# Patient Record
Sex: Female | Born: 1950 | Race: Black or African American | Hispanic: No | Smoking: Never smoker
Health system: Southern US, Community
[De-identification: ages and names within clinical notes are randomized; demographics above are authoritative.]

## PROBLEM LIST (undated history)

## (undated) DIAGNOSIS — I10 Essential (primary) hypertension: Secondary | ICD-10-CM

## (undated) DIAGNOSIS — I639 Cerebral infarction, unspecified: Secondary | ICD-10-CM

## (undated) DIAGNOSIS — E119 Type 2 diabetes mellitus without complications: Secondary | ICD-10-CM

## (undated) DIAGNOSIS — G4730 Sleep apnea, unspecified: Secondary | ICD-10-CM

## (undated) DIAGNOSIS — M1990 Unspecified osteoarthritis, unspecified site: Secondary | ICD-10-CM

## (undated) DIAGNOSIS — E785 Hyperlipidemia, unspecified: Secondary | ICD-10-CM

## (undated) DIAGNOSIS — D649 Anemia, unspecified: Secondary | ICD-10-CM

## (undated) HISTORY — DX: Type 2 diabetes mellitus without complications: E11.9

## (undated) HISTORY — PX: COLONOSCOPY: SHX174

## (undated) HISTORY — PX: MYOMECTOMY: SHX85

## (undated) HISTORY — DX: Essential (primary) hypertension: I10

## (undated) HISTORY — DX: Hyperlipidemia, unspecified: E78.5

---

## 2001-10-14 ENCOUNTER — Emergency Department (HOSPITAL_COMMUNITY): Admission: EM | Admit: 2001-10-14 | Discharge: 2001-10-14 | Payer: Self-pay | Admitting: Emergency Medicine

## 2003-03-02 ENCOUNTER — Other Ambulatory Visit: Admission: RE | Admit: 2003-03-02 | Discharge: 2003-03-02 | Payer: Self-pay | Admitting: Obstetrics and Gynecology

## 2006-06-13 ENCOUNTER — Other Ambulatory Visit: Admission: RE | Admit: 2006-06-13 | Discharge: 2006-06-13 | Payer: Self-pay | Admitting: *Deleted

## 2006-09-20 ENCOUNTER — Encounter: Admission: RE | Admit: 2006-09-20 | Discharge: 2006-09-20 | Payer: Self-pay | Admitting: *Deleted

## 2006-10-09 ENCOUNTER — Encounter: Admission: RE | Admit: 2006-10-09 | Discharge: 2006-10-09 | Payer: Self-pay | Admitting: *Deleted

## 2009-07-13 ENCOUNTER — Inpatient Hospital Stay (HOSPITAL_COMMUNITY): Admission: RE | Admit: 2009-07-13 | Discharge: 2009-07-15 | Payer: Self-pay | Admitting: Obstetrics and Gynecology

## 2009-07-13 ENCOUNTER — Encounter (HOSPITAL_COMMUNITY): Payer: Self-pay | Admitting: Obstetrics and Gynecology

## 2010-05-31 LAB — GLUCOSE, CAPILLARY
Glucose-Capillary: 134 mg/dL — ABNORMAL HIGH (ref 70–99)
Glucose-Capillary: 148 mg/dL — ABNORMAL HIGH (ref 70–99)
Glucose-Capillary: 151 mg/dL — ABNORMAL HIGH (ref 70–99)
Glucose-Capillary: 157 mg/dL — ABNORMAL HIGH (ref 70–99)
Glucose-Capillary: 269 mg/dL — ABNORMAL HIGH (ref 70–99)

## 2010-05-31 LAB — BASIC METABOLIC PANEL
CO2: 28 mEq/L (ref 19–32)
Calcium: 10.6 mg/dL — ABNORMAL HIGH (ref 8.4–10.5)
Creatinine, Ser: 0.92 mg/dL (ref 0.4–1.2)
GFR calc Af Amer: >60 mL/min (ref >60)
GFR calc non Af Amer: >60 mL/min (ref >60)

## 2010-05-31 LAB — CBC
HCT: 32.3 % — ABNORMAL LOW (ref 36.0–46.0)
MCHC: 34 g/dL (ref 30.0–36.0)
MCV: 88.3 fL (ref 78.0–100.0)
Platelets: 180 10*3/uL (ref 150–400)
RBC: 3.66 MIL/uL — ABNORMAL LOW (ref 3.87–5.11)
RBC: 4.68 MIL/uL (ref 3.87–5.11)
WBC: 5.9 10*3/uL (ref 4.0–10.5)

## 2010-06-06 LAB — CBC
RBC: 4.62 MIL/uL (ref 3.87–5.11)
WBC: 5.3 10*3/uL (ref 4.0–10.5)

## 2010-06-06 LAB — BASIC METABOLIC PANEL
CO2: 30 mEq/L (ref 19–32)
Chloride: 102 mEq/L (ref 96–112)
GFR calc Af Amer: >60 mL/min (ref >60)
Sodium: 139 mEq/L (ref 135–145)

## 2010-09-06 ENCOUNTER — Other Ambulatory Visit: Payer: Self-pay | Admitting: Family Medicine

## 2010-09-06 DIAGNOSIS — Z1231 Encounter for screening mammogram for malignant neoplasm of breast: Secondary | ICD-10-CM

## 2012-07-19 ENCOUNTER — Encounter (HOSPITAL_COMMUNITY): Payer: Self-pay

## 2012-07-19 ENCOUNTER — Emergency Department (INDEPENDENT_AMBULATORY_CARE_PROVIDER_SITE_OTHER)
Admission: EM | Admit: 2012-07-19 | Discharge: 2012-07-19 | Disposition: A | Discharge status: Home / Self Care | Payer: BC Managed Care – PPO | Source: Home / Self Care

## 2012-07-19 DIAGNOSIS — E119 Type 2 diabetes mellitus without complications: Secondary | ICD-10-CM

## 2012-07-19 DIAGNOSIS — I10 Essential (primary) hypertension: Secondary | ICD-10-CM

## 2012-07-19 MED ORDER — INSULIN GLARGINE 100 UNIT/ML ~~LOC~~ SOLN: 40.0000 [IU] | Freq: Every day | SUBCUTANEOUS | Status: DC

## 2012-07-19 MED ORDER — AMLODIPINE BESYLATE 10 MG PO TABS: 10.0000 mg | ORAL_TABLET | Freq: Every day | ORAL | Status: DC

## 2012-07-19 MED ORDER — SIMVASTATIN 40 MG PO TABS: 40.0000 mg | ORAL_TABLET | Freq: Every evening | ORAL | Status: DC

## 2012-07-19 MED ORDER — METOPROLOL-HYDROCHLOROTHIAZIDE 100-25 MG PO TABS: 1.0000 | ORAL_TABLET | Freq: Every day | ORAL | Status: DC

## 2012-07-19 MED ORDER — METFORMIN HCL 500 MG PO TABS: 500.0000 mg | ORAL_TABLET | Freq: Two times a day (BID) | ORAL | Status: DC

## 2012-07-19 MED ORDER — LISINOPRIL 20 MG PO TABS: 20.0000 mg | ORAL_TABLET | Freq: Every day | ORAL | Status: DC

## 2012-07-19 NOTE — ED Notes (Signed)
Patient here to establish care Has history of DM-HTN and cholesterol

## 2012-07-19 NOTE — ED Provider Notes (Signed)
History     CSN: [id number]  Arrival date & time 07/19/12  1736   First MD Initiated Contact with Patient 07/19/12 1755      Chief Complaint  Patient presents with  . Establish Care    (Consider location/radiation/quality/duration/timing/severity/associated sxs/prior treatment) HPI 62 year old female with past medical history of hypertension and diabetes who presented to our clinic for medication refills. Patient has no current complaints. No complaints of chest pain, shortness of breath or palpitations. No blurry vision, no lightheadedness or loss of consciousness. No abdominal pain or nausea or vomiting.  History reviewed. No pertinent past medical history.  History reviewed. No pertinent past surgical history.  Family medical history significant for HTN, HLD   History  Substance Use Topics  . Smoking status: Never Smoker   . Smokeless tobacco: Not on file  . Alcohol Use: No    OB History   Grav Para Term Preterm Abortions TAB SAB Ect Mult Living                  Review of Systems  Constitutional: Negative for fever, chills, diaphoresis, activity change, appetite change and fatigue.  HENT: Negative for ear pain, nosebleeds, congestion, facial swelling, rhinorrhea, neck pain, neck stiffness and ear discharge.   Eyes: Negative for pain, discharge, redness, itching and visual disturbance.  Respiratory: Negative for cough, choking, chest tightness, shortness of breath, wheezing and stridor.   Cardiovascular: Negative for chest pain, palpitations and leg swelling.  Gastrointestinal: Negative for abdominal distention.  Genitourinary: Negative for dysuria, urgency, frequency, hematuria, flank pain, decreased urine volume, difficulty urinating and dyspareunia.  Musculoskeletal: Negative for back pain, joint swelling, arthralgias and gait problem.  Neurological: Negative for dizziness, tremors, seizures, syncope, facial asymmetry, speech difficulty, weakness, light-headedness,  numbness and headaches.  Hematological: Negative for adenopathy. Does not bruise/bleed easily.  Psychiatric/Behavioral: Negative for hallucinations, behavioral problems, confusion, dysphoric mood, decreased concentration and agitation.    Allergies  Review of patient's allergies indicates no known allergies.  Home Medications   Current Outpatient Rx  Name  Route  Sig  Dispense  Refill  . amLODipine (NORVASC) 10 MG tablet   Oral   Take 1 tablet (10 mg total) by mouth daily.   30 tablet   3   . insulin glargine (LANTUS) 100 UNIT/ML injection   Subcutaneous   Inject 0.4 mLs (40 Units total) into the skin at bedtime.   10 mL   3   . lisinopril (PRINIVIL,ZESTRIL) 20 MG tablet   Oral   Take 1 tablet (20 mg total) by mouth daily.   30 tablet   3   . metFORMIN (GLUCOPHAGE) 500 MG tablet   Oral   Take 1 tablet (500 mg total) by mouth 2 (two) times daily with a meal.   60 tablet   3   . metoprolol-hydrochlorothiazide (LOPRESSOR HCT) 100-25 MG per tablet   Oral   Take 1 tablet by mouth daily.   30 tablet   3   . simvastatin (ZOCOR) 40 MG tablet   Oral   Take 1 tablet (40 mg total) by mouth every evening.   30 tablet   3     BP 173/91  Pulse 93  Temp(Src) 97.8 F (36.6 C) (Oral)  Resp 16  SpO2 95%  Physical Exam  Constitutional: Appears well-developed and well-nourished. No distress.  HENT: Normocephalic. External right and left ear normal. Oropharynx is clear and moist.  Eyes: Conjunctivae and EOM are normal. PERRLA, no scleral icterus.  Neck: Normal ROM. Neck supple. No JVD. No tracheal deviation. No thyromegaly.  CVS: RRR, S1/S2 +, no murmurs, no gallops, no carotid bruit.  Pulmonary: Effort and breath sounds normal, no stridor, rhonchi, wheezes, rales.  Abdominal: Soft. BS +,  no distension, tenderness, rebound or guarding.  Musculoskeletal: Normal range of motion. No edema and no tenderness.  Lymphadenopathy: No lymphadenopathy noted, cervical,  inguinal. Neuro: Alert. Normal reflexes, muscle tone coordination. No cranial nerve deficit. Skin: Skin is warm and dry. No rash noted. Not diaphoretic. No erythema. No pallor.  Psychiatric: Normal mood and affect. Behavior, judgment, thought content normal.    ED Course  Procedures (including critical care time)  Labs Reviewed - No data to display No results found.   1. Hypertension  - We have discussed target BP range - I have advised pt to check BP regularly and to call us back if the numbers are higher than 140/90 - discussed the importance of compliance with medical therapy and diet  - Blood pressure is slightly higher on this visit 173/91. Patient instructed to follow up with [place] and be compliant with BP medications. She reported she ran out of meds for which reason her BP may be elevated.   2. Diabetes  - We will check A1c on the next visit. Continue taking insulin and metformin       MDM  HTN Diabetes        Alison Murray, MD 07/19/12 1801

## 2013-04-16 ENCOUNTER — Ambulatory Visit: Payer: BC Managed Care – PPO | Admitting: Internal Medicine

## 2013-10-08 ENCOUNTER — Other Ambulatory Visit: Payer: Self-pay | Admitting: Family

## 2013-10-08 DIAGNOSIS — R29810 Facial weakness: Secondary | ICD-10-CM

## 2013-10-08 DIAGNOSIS — I10 Essential (primary) hypertension: Secondary | ICD-10-CM

## 2013-10-08 DIAGNOSIS — G459 Transient cerebral ischemic attack, unspecified: Secondary | ICD-10-CM

## 2013-10-15 ENCOUNTER — Inpatient Hospital Stay: Admission: RE | Admit: 2013-10-15 | Payer: BC Managed Care – PPO | Source: Ambulatory Visit

## 2013-10-15 ENCOUNTER — Ambulatory Visit
Admission: RE | Admit: 2013-10-15 | Discharge: 2013-10-15 | Disposition: A | Discharge status: Home / Self Care | Payer: BC Managed Care – PPO | Source: Ambulatory Visit | Attending: Family | Admitting: Family

## 2013-10-15 DIAGNOSIS — G459 Transient cerebral ischemic attack, unspecified: Secondary | ICD-10-CM

## 2013-10-15 DIAGNOSIS — I10 Essential (primary) hypertension: Secondary | ICD-10-CM

## 2013-10-15 DIAGNOSIS — R29810 Facial weakness: Secondary | ICD-10-CM

## 2013-10-17 ENCOUNTER — Other Ambulatory Visit: Payer: BC Managed Care – PPO

## 2013-10-20 ENCOUNTER — Ambulatory Visit
Admission: RE | Admit: 2013-10-20 | Discharge: 2013-10-20 | Disposition: A | Discharge status: Home / Self Care | Payer: BC Managed Care – PPO | Source: Ambulatory Visit | Attending: Family | Admitting: Family

## 2013-10-20 DIAGNOSIS — R29810 Facial weakness: Secondary | ICD-10-CM

## 2013-10-20 DIAGNOSIS — I10 Essential (primary) hypertension: Secondary | ICD-10-CM

## 2013-10-20 DIAGNOSIS — G459 Transient cerebral ischemic attack, unspecified: Secondary | ICD-10-CM

## 2013-10-20 MED ORDER — IOHEXOL 350 MG/ML SOLN
75.0000 mL | Freq: Once | INTRAVENOUS | Status: AC | PRN
  Administered 2013-10-20: 75 mL via INTRAVENOUS

## 2013-11-26 ENCOUNTER — Encounter: Payer: BC Managed Care – PPO | Admitting: Vascular Surgery

## 2013-12-04 ENCOUNTER — Encounter: Payer: Self-pay | Admitting: Vascular Surgery

## 2013-12-05 ENCOUNTER — Encounter: Payer: BC Managed Care – PPO | Admitting: Vascular Surgery

## 2013-12-16 ENCOUNTER — Encounter: Payer: Self-pay | Admitting: Vascular Surgery

## 2013-12-17 ENCOUNTER — Ambulatory Visit (INDEPENDENT_AMBULATORY_CARE_PROVIDER_SITE_OTHER): Payer: BC Managed Care – PPO | Admitting: Vascular Surgery

## 2013-12-17 ENCOUNTER — Encounter: Payer: Self-pay | Admitting: Vascular Surgery

## 2013-12-17 VITALS — BP 133/77 | HR 84 | Resp 18 | Ht 61.0 in | Wt 172.0 lb

## 2013-12-17 DIAGNOSIS — I739 Peripheral vascular disease, unspecified: Principal | ICD-10-CM

## 2013-12-17 DIAGNOSIS — I779 Disorder of arteries and arterioles, unspecified: Secondary | ICD-10-CM | POA: Insufficient documentation

## 2013-12-17 NOTE — Assessment & Plan Note (Signed)
This patient had a brief episode of speech disturbance several months ago. She's had no further symptoms. Her carotid duplex scan shows only mild disease bilaterally. She was not on aspirin at the time and I've asked her to begin taking aspirin. She is on a statin. I have recommended a follow carotid duplex scan in 6 months and I'll see her back at that time. She knows to call sooner she has any new neurologic symptoms. At this point, given the duplex findings which show only mild disease, I would not recommend any further workup or intervention.

## 2013-12-17 NOTE — Progress Notes (Signed)
Patient ID: Donna ChampionGarnette S Quinones, female   DOB: Aug 07, 1950, 63 y.o.   MRN: [id number]  Reason for Consult: New carotid evaluation   Referred by Truman HaywardStarkes, Takia S, FNP  Subjective:     HPI:  Donna [name] is a 63 y.o. female who tells me that several months ago she had an episode where she had some difficulty speaking. She claims that she was simply tired however there was some concern that she had a TIA. This prompted a workup including a carotid duplex scan and CT angiogram. She was found to have moderate carotid disease and sent for carotid evaluation. She cannot remember exactly when these symptoms occurred, but states that she has had no further symptoms. She is currently on a statin but does not take aspirin.  She is right-handed. She denies any previous history of stroke, TIAs, expressive or receptive aphasia, or amaurosis fugax.  I have reviewed her records from the referring physician's office and she does have a history of poorly controlled blood pressure. She had some issues with noncompliance in the past.  Past Medical History  Diagnosis Date  . Hypertension   . Diabetes mellitus without complication   . Hyperlipidemia    Family History  Problem Relation Age of Onset  . Diabetes Father   . Diabetes Sister    Past Surgical History  Procedure Laterality Date  . Myomectomy     Short Social History:  History  Substance Use Topics  . Smoking status: Never Smoker   . Smokeless tobacco: Never Used  . Alcohol Use: No   No Known Allergies  Current Outpatient Prescriptions  Medication Sig Dispense Refill  . insulin glargine (LANTUS) 100 UNIT/ML injection Inject 0.4 mLs (40 Units total) into the skin at bedtime.  10 mL  3  . metFORMIN (GLUCOPHAGE) 500 MG tablet Take 1 tablet (500 mg total) by mouth 2 (two) times daily with a meal.  60 tablet  3  . olmesartan-hydrochlorothiazide (BENICAR HCT) 40-25 MG per tablet Take 1 tablet by mouth daily.      . simvastatin (ZOCOR) 40 MG  tablet Take 1 tablet (40 mg total) by mouth every evening.  30 tablet  3  . amLODipine (NORVASC) 10 MG tablet Take 1 tablet (10 mg total) by mouth daily.  30 tablet  3  . lisinopril (PRINIVIL,ZESTRIL) 20 MG tablet Take 1 tablet (20 mg total) by mouth daily.  30 tablet  3  . metoprolol-hydrochlorothiazide (LOPRESSOR HCT) 100-25 MG per tablet Take 1 tablet by mouth daily.  30 tablet  3   No current facility-administered medications for this visit.   Review of Systems  Constitutional: Negative for chills and fever.  Eyes: Negative for loss of vision.  Respiratory: Negative for cough and wheezing.  Cardiovascular: Negative for chest pain, chest tightness, claudication, dyspnea with exertion, orthopnea and palpitations.  GI: Negative for blood in stool and vomiting.  GU: Negative for dysuria and hematuria.  Musculoskeletal: Negative for leg pain, joint pain and myalgias.  Skin: Negative for rash and wound.  Neurological: Negative for dizziness and speech difficulty.  Hematologic: Negative for bruises/bleeds easily. Psychiatric: Negative for depressed mood.       Objective:  Objective  Filed Vitals:   12/17/13 1533 12/17/13 1535  BP: 154/79 133/77  Pulse: 89 84  Resp: 18   Height: 5\' 1"  (1.549 m)   Weight: 172 lb (78.019 kg)    Body mass index is 32.52 kg/(m^2).  Physical Exam  Constitutional: She is  oriented to person, place, and time. She appears well-developed and well-nourished.  HENT:  Head: Normocephalic and atraumatic.  Neck: Neck supple. No JVD present. No thyromegaly present.  Cardiovascular: Normal rate, regular rhythm and normal heart sounds.  Exam reveals no friction rub.   No murmur heard. Pulses:      Radial pulses are 2+ on the right side, and 2+ on the left side.       Femoral pulses are 2+ on the right side, and 2+ on the left side.      Popliteal pulses are 2+ on the right side, and 2+ on the left side.       Dorsalis pedis pulses are 2+ on the right side, and  2+ on the left side.  I do not detect carotid bruits.  Pulmonary/Chest: Breath sounds normal. She has no wheezes. She has no rales.  Abdominal: Soft. Bowel sounds are normal. There is no tenderness.  I do not palpate an aneurysm.  Musculoskeletal: Normal range of motion. She exhibits no edema.  Lymphadenopathy:    She has no cervical adenopathy.  Neurological: She is alert and oriented to person, place, and time. She has normal strength. No sensory deficit.  Skin: No lesion and no rash noted.  Psychiatric: She has a normal mood and affect.   Data: CAROTID DUPLEX: I reviewed her carotid duplex scan which was performed on 10/15/2013. This showed evidence of a less than 50% right carotid stenosis with a 50-69% left carotid stenosis. Peak systolic velocity on the left was 197 cm/s with an end-diastolic velocity of 33 cm/s. This would suggest that the stenosis was in the lower and of the 50-69% range.  Both vertebral arteries were patent with normally directed flow.  CT ANGIOGRAM: This showed no acute infarct or hemorrhage. There was some atherosclerotic plaque in the cavernous carotid arteries bilaterally without significant stenosis.     Assessment/Plan:    Carotid artery disease This patient had a brief episode of speech disturbance several months ago. She's had no further symptoms. Her carotid duplex scan shows only mild disease bilaterally. She was not on aspirin at the time and I've asked her to begin taking aspirin. She is on a statin. I have recommended a follow carotid duplex scan in 6 months and I'll see her back at that time. She knows to call sooner she has any new neurologic symptoms. At this point, given the duplex findings which show only mild disease, I would not recommend any further workup or intervention.   Chuck Hint MD [facility]

## 2013-12-18 NOTE — Addendum Note (Signed)
Addended by: Adria DillELDRIDGE-LEWIS, Sheray Grist L on: 12/18/2013 10:45 AM   Modules accepted: Orders

## 2014-04-10 LAB — HM COLONOSCOPY

## 2014-06-17 ENCOUNTER — Other Ambulatory Visit (HOSPITAL_COMMUNITY): Payer: BC Managed Care – PPO

## 2014-06-17 ENCOUNTER — Ambulatory Visit: Payer: BC Managed Care – PPO | Admitting: Vascular Surgery

## 2014-06-24 ENCOUNTER — Other Ambulatory Visit: Payer: Self-pay | Admitting: Vascular Surgery

## 2014-06-24 ENCOUNTER — Ambulatory Visit (HOSPITAL_COMMUNITY)
Admission: RE | Admit: 2014-06-24 | Discharge: 2014-06-24 | Disposition: A | Discharge status: Home / Self Care | Payer: BLUE CROSS/BLUE SHIELD | Source: Ambulatory Visit | Attending: Vascular Surgery | Admitting: Vascular Surgery

## 2014-06-24 DIAGNOSIS — I739 Peripheral vascular disease, unspecified: Principal | ICD-10-CM

## 2014-06-24 DIAGNOSIS — I779 Disorder of arteries and arterioles, unspecified: Secondary | ICD-10-CM

## 2014-06-30 ENCOUNTER — Encounter: Payer: Self-pay | Admitting: Vascular Surgery

## 2014-07-01 ENCOUNTER — Ambulatory Visit: Payer: Self-pay | Admitting: Vascular Surgery

## 2014-07-01 ENCOUNTER — Ambulatory Visit (INDEPENDENT_AMBULATORY_CARE_PROVIDER_SITE_OTHER): Payer: BLUE CROSS/BLUE SHIELD | Admitting: Vascular Surgery

## 2014-07-01 ENCOUNTER — Encounter: Payer: Self-pay | Admitting: Vascular Surgery

## 2014-07-01 VITALS — BP 211/95 | HR 94 | Ht 61.0 in | Wt 174.9 lb

## 2014-07-01 DIAGNOSIS — I6523 Occlusion and stenosis of bilateral carotid arteries: Secondary | ICD-10-CM
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
## 2014-07-07 NOTE — Addendum Note (Signed)
Addended by: Sharee PimpleMCCHESNEY, MARILYN K on: 07/07/2014 02:48 PM   Modules accepted: Orders

## 2015-06-30 ENCOUNTER — Encounter: Payer: Self-pay | Admitting: Family

## 2015-07-07 ENCOUNTER — Encounter: Payer: Self-pay | Admitting: Family

## 2015-07-07 ENCOUNTER — Ambulatory Visit (INDEPENDENT_AMBULATORY_CARE_PROVIDER_SITE_OTHER): Payer: BLUE CROSS/BLUE SHIELD | Admitting: Family

## 2015-07-07 ENCOUNTER — Ambulatory Visit (HOSPITAL_COMMUNITY)
Admission: RE | Admit: 2015-07-07 | Discharge: 2015-07-07 | Disposition: A | Discharge status: Home / Self Care | Payer: BLUE CROSS/BLUE SHIELD | Source: Ambulatory Visit | Attending: Family | Admitting: Family

## 2015-07-07 VITALS — BP 175/80 | Ht 61.0 in | Wt 176.9 lb

## 2015-07-07 DIAGNOSIS — I10 Essential (primary) hypertension: Secondary | ICD-10-CM

## 2015-07-07 DIAGNOSIS — E119 Type 2 diabetes mellitus without complications: Secondary | ICD-10-CM | POA: Diagnosis not present

## 2015-07-07 DIAGNOSIS — I6523 Occlusion and stenosis of bilateral carotid arteries: Secondary | ICD-10-CM

## 2015-07-07 DIAGNOSIS — E785 Hyperlipidemia, unspecified: Secondary | ICD-10-CM | POA: Diagnosis not present
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
## 2015-07-07 NOTE — Patient Instructions (Signed)
Stroke Prevention Some medical conditions and behaviors are associated with an increased chance of having a stroke. You may prevent a stroke by making healthy choices and managing medical conditions. HOW CAN I REDUCE MY RISK OF HAVING A STROKE?   Stay physically active. Get at least 30 minutes of activity on most or all days.  Do not smoke. It may also be helpful to avoid exposure to secondhand smoke.  Limit alcohol use. Moderate alcohol use is considered to be:  No more than 2 drinks per day for men.  No more than 1 drink per day for nonpregnant women.  Eat healthy foods. This involves:  Eating 5 or more servings of fruits and vegetables a day.  Making dietary changes that address high blood pressure (hypertension), high cholesterol, diabetes, or obesity.  Manage your cholesterol levels.  Making food choices that are high in fiber and low in saturated fat, trans fat, and cholesterol may control cholesterol levels.  Take any prescribed medicines to control cholesterol as directed by your health care provider.  Manage your diabetes.  Controlling your carbohydrate and sugar intake is recommended to manage diabetes.  Take any prescribed medicines to control diabetes as directed by your health care provider.  Control your hypertension.  Making food choices that are low in salt (sodium), saturated fat, trans fat, and cholesterol is recommended to manage hypertension.  Ask your health care provider if you need treatment to lower your blood pressure. Take any prescribed medicines to control hypertension as directed by your health care provider.  If you are 18-39 years of age, have your blood pressure checked every 3-5 years. If you are 40 years of age or older, have your blood pressure checked every year.  Maintain a healthy weight.  Reducing calorie intake and making food choices that are low in sodium, saturated fat, trans fat, and cholesterol are recommended to manage  weight.  Stop drug abuse.  Avoid taking birth control pills.  Talk to your health care provider about the risks of taking birth control pills if you are over 35 years old, smoke, get migraines, or have ever had a blood clot.  Get evaluated for sleep disorders (sleep apnea).  Talk to your health care provider about getting a sleep evaluation if you snore a lot or have excessive sleepiness.  Take medicines only as directed by your health care provider.  For some people, aspirin or blood thinners (anticoagulants) are helpful in reducing the risk of forming abnormal blood clots that can lead to stroke. If you have the irregular heart rhythm of atrial fibrillation, you should be on a blood thinner unless there is a good reason you cannot take them.  Understand all your medicine instructions.  Make sure that other conditions (such as anemia or atherosclerosis) are addressed. SEEK IMMEDIATE MEDICAL CARE IF:   You have sudden weakness or numbness of the face, arm, or leg, especially on one side of the body.  Your face or eyelid droops to one side.  You have sudden confusion.  You have trouble speaking (aphasia) or understanding.  You have sudden trouble seeing in one or both eyes.  You have sudden trouble walking.  You have dizziness.  You have a loss of balance or coordination.  You have a sudden, severe headache with no known cause.  You have new chest pain or an irregular heartbeat. Any of these symptoms may represent a serious problem that is an emergency. Do not wait to see if the symptoms will   go away. Get medical help at once. Call your local emergency services (911 in [place] Do not drive yourself to the hospital.   This information is not intended to replace advice given to you by your health care provider. Make sure you discuss any questions you have with your health care provider.   Document Released: 04/06/2004 Document Revised: 03/20/2014 Document Reviewed:  08/30/2012 Elsevier Interactive Patient Education 2016 Elsevier Inc.  

## 2015-08-10 NOTE — Addendum Note (Signed)
Addended by: Sharee PimpleMCCHESNEY, MARILYN K on: 08/10/2015 04:55 PM   Modules accepted: Orders

## 2016-01-19 ENCOUNTER — Ambulatory Visit: Payer: BLUE CROSS/BLUE SHIELD | Admitting: Podiatry

## 2016-02-09 ENCOUNTER — Ambulatory Visit (INDEPENDENT_AMBULATORY_CARE_PROVIDER_SITE_OTHER): Payer: BLUE CROSS/BLUE SHIELD | Admitting: Podiatry

## 2016-02-09 ENCOUNTER — Encounter: Payer: Self-pay | Admitting: Podiatry

## 2016-02-09 VITALS — BP 186/90 | HR 91 | Resp 18

## 2016-02-09 DIAGNOSIS — E119 Type 2 diabetes mellitus without complications: Secondary | ICD-10-CM | POA: Diagnosis not present

## 2016-02-09 NOTE — Progress Notes (Signed)
   Subjective:    Patient ID: Donna [name], female    DOB: 05-26-1950, 65 y.o.   MRN: [id number]  HPI    This diabetic patient presents today requesting a general diabetic foot examination. She estimates that she's been a diabetic for 10-14 years. She denies any foot pain, however, describes some swelling in the p.m. in her toes reducing in the a.m. She denies any history of foot ulceration, claudication or amputation She denies smoking. She does not recall any previous podiatric examination Patient is a Pharmacologistretail worker [employer]  Review of Systems  All other systems reviewed and are negative.      Objective:   Physical Exam  Orientated 3  Vascular: No peripheral edema bilaterally No calf pain bilaterally DP and PT pulses 2/4 bilaterally Capillary reflex immediate bilaterally  Neurological: Sensation to 10 g monofilament wire intact 5/5 bilaterally Vibratory sensation reactive bilaterally Ankle reflex equal and reactive bilaterally  Dermatological: No open skin lesions bilaterally Minimal keratoses lateral fifth toes bilaterally Manual motor testing dorsi flexion, plantar flexion, inversion, eversion 5/5 bilaterally There is no restriction range of motion or crepitus in the ankle, subtalar, midtarsal, MPJ bilaterally      Assessment & Plan:   Assessment: Type II diabetic with satisfactory neurovascular status No diabetic foot complications  Plan: I reviewed the results of the exam with patient today in detail. I informed her that her diabetic foot exam was satisfactory for her neurovascular status. I discussed general foot care with patient. Also, recommended that when she has pain cares not to place feet and whirlpool or manipulate the cuticles. Suggested slight promising of the minimal corns on the fifth toes.  Return yearly or as needed

## 2016-02-09 NOTE — Patient Instructions (Signed)

## 2016-07-05 DIAGNOSIS — I10 Essential (primary) hypertension: Secondary | ICD-10-CM | POA: Diagnosis not present

## 2016-07-05 DIAGNOSIS — E7800 Pure hypercholesterolemia, unspecified: Secondary | ICD-10-CM | POA: Diagnosis not present

## 2016-07-05 DIAGNOSIS — M79673 Pain in unspecified foot: Secondary | ICD-10-CM | POA: Diagnosis not present

## 2016-07-05 DIAGNOSIS — E119 Type 2 diabetes mellitus without complications: Secondary | ICD-10-CM | POA: Diagnosis not present

## 2016-12-20 DIAGNOSIS — E2839 Other primary ovarian failure: Secondary | ICD-10-CM | POA: Diagnosis not present

## 2016-12-20 DIAGNOSIS — I10 Essential (primary) hypertension: Secondary | ICD-10-CM | POA: Diagnosis not present

## 2016-12-20 DIAGNOSIS — E119 Type 2 diabetes mellitus without complications: Secondary | ICD-10-CM | POA: Diagnosis not present

## 2016-12-20 DIAGNOSIS — Z0000 Encounter for general adult medical examination without abnormal findings: Secondary | ICD-10-CM | POA: Diagnosis not present

## 2016-12-20 DIAGNOSIS — Z1231 Encounter for screening mammogram for malignant neoplasm of breast: Secondary | ICD-10-CM | POA: Diagnosis not present

## 2016-12-20 DIAGNOSIS — Z79899 Other long term (current) drug therapy: Secondary | ICD-10-CM | POA: Diagnosis not present

## 2016-12-29 ENCOUNTER — Other Ambulatory Visit: Payer: Self-pay | Admitting: Internal Medicine

## 2016-12-29 DIAGNOSIS — E2839 Other primary ovarian failure: Secondary | ICD-10-CM

## 2017-02-06 ENCOUNTER — Encounter (INDEPENDENT_AMBULATORY_CARE_PROVIDER_SITE_OTHER): Payer: BLUE CROSS/BLUE SHIELD | Admitting: Podiatry

## 2017-02-06 NOTE — Progress Notes (Signed)
This encounter was created in error - please disregard.

## 2017-07-11 ENCOUNTER — Encounter (HOSPITAL_COMMUNITY): Payer: BLUE CROSS/BLUE SHIELD

## 2017-07-11 ENCOUNTER — Ambulatory Visit: Payer: BLUE CROSS/BLUE SHIELD | Admitting: Family

## 2017-07-12 ENCOUNTER — Encounter: Payer: Self-pay | Admitting: Family

## 2017-07-12 ENCOUNTER — Ambulatory Visit (HOSPITAL_COMMUNITY)
Admission: RE | Admit: 2017-07-12 | Discharge: 2017-07-12 | Disposition: A | Discharge status: Home / Self Care | Payer: Medicare Other | Source: Ambulatory Visit | Attending: Family | Admitting: Family

## 2017-07-12 ENCOUNTER — Ambulatory Visit (INDEPENDENT_AMBULATORY_CARE_PROVIDER_SITE_OTHER): Payer: Medicare Other | Admitting: Family

## 2017-07-12 VITALS — BP 232/100 | HR 89 | Temp 97.2°F | Resp 18 | Ht 61.0 in | Wt 167.0 lb

## 2017-07-12 DIAGNOSIS — I6523 Occlusion and stenosis of bilateral carotid arteries: Secondary | ICD-10-CM | POA: Insufficient documentation

## 2017-07-12 DIAGNOSIS — I10 Essential (primary) hypertension: Secondary | ICD-10-CM

## 2017-07-12 NOTE — Patient Instructions (Signed)
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
## 2017-07-13 DIAGNOSIS — E119 Type 2 diabetes mellitus without complications: Secondary | ICD-10-CM | POA: Diagnosis not present

## 2017-07-13 DIAGNOSIS — E7800 Pure hypercholesterolemia, unspecified: Secondary | ICD-10-CM | POA: Diagnosis not present

## 2017-07-13 DIAGNOSIS — I10 Essential (primary) hypertension: Secondary | ICD-10-CM | POA: Diagnosis not present

## 2017-07-13 DIAGNOSIS — R946 Abnormal results of thyroid function studies: Secondary | ICD-10-CM | POA: Diagnosis not present

## 2017-08-03 DIAGNOSIS — I10 Essential (primary) hypertension: Secondary | ICD-10-CM | POA: Diagnosis not present

## 2017-08-03 DIAGNOSIS — E119 Type 2 diabetes mellitus without complications: Secondary | ICD-10-CM | POA: Diagnosis not present

## 2017-10-12 LAB — HEPATIC FUNCTION PANEL
ALK PHOS: 85 (ref 25–125)
ALT: 22
AST: 18

## 2017-10-12 LAB — LIPID PANEL
Cholesterol: 237 — AB (ref 0–200)
HDL: 71 — AB (ref 35–70)
LDL CALC: 2
LDL/HDL RATIO: 1.9
Triglycerides: 167 — AB (ref 40–160)

## 2017-10-12 LAB — BASIC METABOLIC PANEL
BUN: 18 (ref 4–21)
CREATININE: 1
GLUCOSE: 138
Potassium: 4 (ref 3.4–5.3)
Sodium: 141 (ref 137–147)

## 2017-10-12 LAB — HEMOGLOBIN A1C: Hemoglobin A1C: 9.7

## 2017-11-30 DIAGNOSIS — N6314 Unspecified lump in the right breast, lower inner quadrant: Secondary | ICD-10-CM

## 2017-11-30 DIAGNOSIS — Z1231 Encounter for screening mammogram for malignant neoplasm of breast: Secondary | ICD-10-CM

## 2017-11-30 DIAGNOSIS — I10 Essential (primary) hypertension: Secondary | ICD-10-CM

## 2017-12-04 ENCOUNTER — Other Ambulatory Visit: Payer: Self-pay | Admitting: Nurse Practitioner

## 2017-12-04 DIAGNOSIS — N63 Unspecified lump in unspecified breast: Secondary | ICD-10-CM

## 2017-12-09 ENCOUNTER — Encounter: Payer: Self-pay | Admitting: Nurse Practitioner

## 2017-12-09 DIAGNOSIS — I10 Essential (primary) hypertension: Secondary | ICD-10-CM

## 2017-12-09 DIAGNOSIS — E119 Type 2 diabetes mellitus without complications: Secondary | ICD-10-CM | POA: Insufficient documentation

## 2017-12-10 ENCOUNTER — Ambulatory Visit: Payer: Medicare Other

## 2017-12-10 ENCOUNTER — Ambulatory Visit
Admission: RE | Admit: 2017-12-10 | Discharge: 2017-12-10 | Disposition: A | Discharge status: Home / Self Care | Payer: PRIVATE HEALTH INSURANCE | Source: Ambulatory Visit | Attending: Nurse Practitioner | Admitting: Nurse Practitioner

## 2017-12-10 DIAGNOSIS — N63 Unspecified lump in unspecified breast: Secondary | ICD-10-CM

## 2018-01-02 ENCOUNTER — Ambulatory Visit (INDEPENDENT_AMBULATORY_CARE_PROVIDER_SITE_OTHER): Payer: Medicare Other | Admitting: Nurse Practitioner

## 2018-01-02 ENCOUNTER — Ambulatory Visit (INDEPENDENT_AMBULATORY_CARE_PROVIDER_SITE_OTHER): Payer: Medicare Other

## 2018-01-02 VITALS — BP 158/90 | HR 61 | Temp 98.5°F | Ht 60.0 in | Wt 170.0 lb

## 2018-01-02 DIAGNOSIS — E2839 Other primary ovarian failure: Secondary | ICD-10-CM

## 2018-01-02 DIAGNOSIS — E785 Hyperlipidemia, unspecified: Secondary | ICD-10-CM

## 2018-01-02 DIAGNOSIS — Z794 Long term (current) use of insulin: Secondary | ICD-10-CM

## 2018-01-02 DIAGNOSIS — E119 Type 2 diabetes mellitus without complications: Secondary | ICD-10-CM

## 2018-01-02 DIAGNOSIS — I10 Essential (primary) hypertension: Secondary | ICD-10-CM | POA: Diagnosis not present

## 2018-01-02 DIAGNOSIS — Z23 Encounter for immunization: Secondary | ICD-10-CM

## 2018-01-02 DIAGNOSIS — Z1159 Encounter for screening for other viral diseases: Secondary | ICD-10-CM

## 2018-01-02 DIAGNOSIS — Z1211 Encounter for screening for malignant neoplasm of colon: Secondary | ICD-10-CM

## 2018-01-02 DIAGNOSIS — Z0000 Encounter for general adult medical examination without abnormal findings: Secondary | ICD-10-CM

## 2018-01-02 LAB — POCT URINALYSIS DIPSTICK
BILIRUBIN UA: NEGATIVE
Glucose, UA: POSITIVE — AB
Ketones, UA: NEGATIVE
LEUKOCYTES UA: NEGATIVE
Nitrite, UA: NEGATIVE
Protein, UA: POSITIVE — AB
Spec Grav, UA: 1.01 (ref 1.010–1.025)
Urobilinogen, UA: 0.2 E.U./dL
pH, UA: 6 (ref 5.0–8.0)

## 2018-01-02 LAB — POCT UA - MICROALBUMIN
CREATININE, POC: 100 mg/dL
MICROALBUMIN (UR) POC: 150 mg/L

## 2018-01-02 NOTE — Progress Notes (Signed)
Subjective:   Donna [name] is a 67 y.o. female who presents for Medicare Annual (Subsequent) preventive examination.  Review of Systems:  n/a Cardiac Risk Factors include: sedentary lifestyle;diabetes mellitus;dyslipidemia;advanced age (>23men, >54 women)     Objective:     Vitals: BP (!) 158/90 (BP Location: Left Arm)   Pulse 61   Temp 98.5 F (36.9 C)   Ht 5' (1.524 m)   Wt 170 lb (77.1 kg)   SpO2 94%   BMI 33.20 kg/m   Body mass index is 33.2 kg/m.  Advanced Directives 01/02/2018 07/07/2015 12/17/2013  Does Patient Have a Medical Advance Directive? No No No  Would patient like information on creating a medical advance directive? No - Patient declined No - patient declined information Yes English as a second language teacher given    Tobacco Social History   Tobacco Use  Smoking Status Never Smoker  Smokeless Tobacco Never Used     Counseling given: Not Answered   Clinical Intake:  Pre-visit preparation completed: Yes  Pain : No/denies pain Pain Score: 0-No pain     Nutritional Status: BMI > 30  Obese Nutritional Risks: None Diabetes: Yes CBG done?: No Did pt. bring in CBG monitor from home?: No  How often do you need to have someone help you when you read instructions, pamphlets, or other written materials from your doctor or pharmacy?: 1 - Never What is the last grade level you completed in school?: 12th grade  Interpreter Needed?: No  Information entered by :: NAllen LPN  Past Medical History:  Diagnosis Date  . Diabetes mellitus without complication (HCC)   . Hyperlipidemia   . Hypertension    Past Surgical History:  Procedure Laterality Date  . MYOMECTOMY     Family History  Problem Relation Age of Onset  . Diabetes Father   . Diabetes Sister   . Breast cancer Neg Hx    Social History   Socioeconomic History  . Marital status: Legally Separated    Spouse name: Not on file  . Number of children: Not on file  . Years of education: Not on  file  . Highest education level: Not on file  Occupational History  . Not on file  Social Needs  . Financial resource strain: Not hard at all  . Food insecurity:    Worry: Never true    Inability: Never true  . Transportation needs:    Medical: No    Non-medical: No  Tobacco Use  . Smoking status: Never Smoker  . Smokeless tobacco: Never Used  Substance and Sexual Activity  . Alcohol use: No  . Drug use: No  . Sexual activity: Not Currently  Lifestyle  . Physical activity:    Days per week: 0 days    Minutes per session: 0 min  . Stress: Not at all  Relationships  . Social connections:    Talks on phone: Not on file    Gets together: Not on file    Attends religious service: Not on file    Active member of club or organization: Not on file    Attends meetings of clubs or organizations: Not on file    Relationship status: Not on file  Other Topics Concern  . Not on file  Social History Narrative  . Not on file    Outpatient Encounter Medications as of 01/02/2018  Medication Sig  . amLODipine (NORVASC) 5 MG tablet Take 5 mg by mouth daily.  Marland Kitchen aspirin 325 MG tablet  Take 325 mg by mouth daily.  . XULTOPHY 100-3.6 UNIT-MG/ML SOPN   . atorvastatin (LIPITOR) 10 MG tablet Take 10 mg by mouth daily at 6 PM.   . insulin glargine (LANTUS) 100 UNIT/ML injection Inject 0.4 mLs (40 Units total) into the skin at bedtime. (Patient not taking: Reported on 01/02/2018)  . Insulin Glargine-Lixisenatide (SOLIQUA) 100-33 UNT-MCG/ML SOPN Inject into the skin.  Marland Kitchen olmesartan-hydrochlorothiazide (BENICAR HCT) 40-25 MG per tablet Take 1 tablet by mouth daily.  . simvastatin (ZOCOR) 40 MG tablet Take 1 tablet (40 mg total) by mouth every evening.  Lilian Kapur 625 MG tablet    No facility-administered encounter medications on file as of 01/02/2018.     Activities of Daily Living In your present state of health, do you have any difficulty performing the following activities: 01/02/2018    Hearing? N  Vision? N  Difficulty concentrating or making decisions? N  Walking or climbing stairs? N  Dressing or bathing? N  Doing errands, shopping? N  Preparing Food and eating ? N  Using the Toilet? N  In the past six months, have you accidently leaked urine? N  Do you have problems with loss of bowel control? N  Managing your Medications? N  Managing your Finances? N  Housekeeping or managing your Housekeeping? N  Some recent data might be hidden    Patient Care Team: Dorothyann Peng, MD as PCP - General (Internal Medicine) Sallye Lat, MD as Consulting Physician (Ophthalmology)    Assessment:   This is a routine wellness examination for Donna [name].  Exercise Activities and Dietary recommendations Current Exercise Habits: The patient does not participate in regular exercise at present, Exercise limited by: None identified  Goals    . Exercise 150 min/wk Moderate Activity (pt-stated)       Fall Risk Fall Risk  01/02/2018 07/12/2017  Falls in the past year? No No   Is the patient's home free of loose throw rugs in walkways, pet beds, electrical cords, etc?   yes      Grab bars in the bathroom? no      Handrails on the stairs?   yes      Adequate lighting?   yes  Timed Get Up and Go performed: n/a  Depression Screen PHQ 2/9 Scores 01/02/2018 07/12/2017  PHQ - 2 Score 0 0     Cognitive Function     6CIT Screen 01/02/2018  What Year? 0 points  What month? 0 points  What time? 0 points  Count back from 20 0 points  Months in reverse 0 points  Repeat phrase 0 points  Total Score 0    There is no immunization history for the selected administration types on file for this patient.  Qualifies for Shingles Vaccine?yes  Screening Tests Health Maintenance  Topic Date Due  . Hepatitis C Screening  06/28/1950  . FOOT EXAM  06/23/1961  . OPHTHALMOLOGY EXAM  06/23/1961  . TETANUS/TDAP  06/24/1970  . COLONOSCOPY  06/23/2001  . DEXA SCAN  06/23/2016  .  PNA vac Low Risk Adult (1 of 2 - PCV13) 06/23/2016  . INFLUENZA VACCINE  01/03/2019 (Originally 10/11/2017)  . HEMOGLOBIN A1C  04/14/2018  . MAMMOGRAM  12/11/2019    Cancer Screenings: Lung: Low Dose CT Chest recommended if Age 25-80 years, 30 pack-year currently smoking OR have quit w/in 15years. Patient does not qualify. Breast:  Up to date on Mammogram? Yes   Up to date of Bone Density/Dexa? No Colorectal: states she had  Additional Screenings: : Hepatitis C Screening: due     Plan:    Patient states she would like to start exercising regularly. States she sees Dr. Dione Booze for eyes and may follow up with him for a diabetic eye exam. BMD is pending. Reports she has had colonoscopy in the past couple of years.  I have personally reviewed and noted the following in the patient's chart:   . Medical and social history . Use of alcohol, tobacco or illicit drugs  . Current medications and supplements . Functional ability and status . Nutritional status . Physical activity . Advanced directives . List of other physicians . Hospitalizations, surgeries, and ER visits in previous 12 months . Vitals . Screenings to include cognitive, depression, and falls . Referrals and appointments  In addition, I have reviewed and discussed with patient certain preventive protocols, quality metrics, and best practice recommendations. A written personalized care plan for preventive services as well as general preventive health recommendations were provided to patient.     Barb Merino, LPN  16/12/9602

## 2018-01-02 NOTE — Progress Notes (Addendum)
Subjective:     Patient ID: Donna [name] , adult    DOB: 1950-06-19 , 67 y.o.   MRN: [id number]   Diabetes  She presents for her follow-up diabetic visit. She has type 2 diabetes mellitus. Pertinent negatives for diabetes include no fatigue. Risk factors for coronary artery disease include obesity and sedentary lifestyle. She is compliant with treatment most of the time. She has not had a previous visit with a dietitian. She rarely participates in exercise. (She has not been checking her blood sugar due to "the meter is messed up") An ACE inhibitor/angiotensin II receptor blocker is being taken. She does not see a podiatrist.Eye exam is not current.   The patient states she uses post menopausal status for birth control. Last LMP was No LMP recorded. Patient is postmenopausal.. Negative for Dysmenorrhea and Negative for Menorrhagia. Negative for: breast discharge, breast lump(s), breast pain and breast self exam. Associated symptoms include abnormal vaginal bleeding. Pertinent negatives include abnormal bleeding (hematology), anxiety, decreased libido, depression, difficulty falling sleep, dyspareunia, history of infertility, nocturia, sexual dysfunction, sleep disturbances, urinary incontinence, urinary urgency, vaginal discharge and vaginal itching. Diet regular.The patient states her exercise level is    . The patient's tobacco use is:  Social History   Tobacco Use  Smoking Status Never Smoker  Smokeless Tobacco Never Used  . She has been exposed to passive smoke. The patient's alcohol use is:  Social History   Substance and Sexual Activity  Alcohol Use No  . Additional information: Last pap at least 4 years ago  Past Medical History:  Diagnosis Date  . Diabetes mellitus without complication (HCC)   . Hyperlipidemia   . Hypertension       Current Outpatient Medications:  .  amLODipine (NORVASC) 5 MG tablet, Take 5 mg by mouth daily., Disp: , Rfl:  .  aspirin 325 MG tablet, Take  325 mg by mouth daily., Disp: , Rfl:  .  atorvastatin (LIPITOR) 10 MG tablet, Take 10 mg by mouth daily at 6 PM. , Disp: , Rfl:  .  insulin glargine (LANTUS) 100 UNIT/ML injection, Inject 0.4 mLs (40 Units total) into the skin at bedtime. (Patient not taking: Reported on 01/02/2018), Disp: 10 mL, Rfl: 3 .  Insulin Glargine-Lixisenatide (SOLIQUA) 100-33 UNT-MCG/ML SOPN, Inject into the skin., Disp: , Rfl:  .  olmesartan-hydrochlorothiazide (BENICAR HCT) 40-25 MG per tablet, Take 1 tablet by mouth daily., Disp: , Rfl:  .  simvastatin (ZOCOR) 40 MG tablet, Take 1 tablet (40 mg total) by mouth every evening., Disp: 30 tablet, Rfl: 3 .  WELCHOL 625 MG tablet, , Disp: , Rfl:  .  XULTOPHY 100-3.6 UNIT-MG/ML SOPN, , Disp: , Rfl:    No Known Allergies   Review of Systems  Constitutional: Negative.  Negative for fatigue.  Eyes: Negative.   Respiratory: Negative.   Cardiovascular: Negative.   Gastrointestinal: Negative.   Endocrine: Negative.   Genitourinary: Negative.   Musculoskeletal: Negative.   Skin: Negative.   Allergic/Immunologic: Negative.   Neurological: Negative.   Hematological: Negative.   Psychiatric/Behavioral: Negative.      Today's Vitals   01/02/18 1454  BP: (!) 158/90  Pulse: 61  Temp: 98.5 F (36.9 C)  TempSrc: Oral  SpO2: 94%  Weight: 170 lb (77.1 kg)  Height: 5' (1.524 m)   Body mass index is 33.2 kg/m.   Objective:  Physical Exam  Constitutional: She is oriented to person, place, and time. She appears well-developed and well-nourished.  HENT:  Head: Normocephalic and atraumatic.  Right Ear: External ear normal.  Left Ear: External ear normal.  Nose: Nose normal.  Mouth/Throat: Oropharynx is clear and moist.  Eyes: Pupils are equal, round, and reactive to light. Conjunctivae and EOM are normal.  Neck: Normal range of motion. Neck supple.  Cardiovascular: Normal rate, regular rhythm, normal heart sounds and intact distal pulses.  Pulmonary/Chest: Effort  normal and breath sounds normal.  Abdominal: Soft. Bowel sounds are normal.  Musculoskeletal: Normal range of motion.  Neurological: She is alert and oriented to person, place, and time.  Skin: Skin is warm and dry.  Psychiatric: She has a normal mood and affect.        Assessment And Plan:     1. Type 2 diabetes mellitus without complication, with long-term current use of insulin (HCC)  Chronic, controlled  Continue with current medications - Ambulatory referral to Ophthalmology - Hemoglobin A1c  2. Essential hypertension  Chronic, poorly controlled this visit  Encouraged to make sure to avoid high salt foods.  Continue with current medications 3. Hyperlipidemia, unspecified hyperlipidemia type  Chronic, fair control  Continue with current medications - Lipid Profile  4. Encounter for hepatitis C screening test for low risk patient  Will check for for hepatitis c due to age range - Hepatitis C antibody  5. Decreased estrogen level  Check bone density    6. Encounter for screening colonoscopy  According to USPTF Colorectal cancer Screening guidelines. Colonoscopy is recommended every 10 years, starting at age 84years.  Will refer to GI for colon cancer screening. - Ambulatory referral to Gastroenterology   Arnette Felts, FNP

## 2018-01-02 NOTE — Patient Instructions (Signed)
Hepatitis C Test Hepatitis C is a liver infection caused by the hepatitis C virus (HCV). Hepatitis C is usually diagnosed with two blood tests. One test checks for antibodies to the virus in your blood. Antibodies are proteins that your body makes to fight infections. If you have antibodies to HCV, it means you have been infected. It does not mean you are still infected. An HCV infection may not cause any symptoms, and you may be able to get rid of the virus without treatment. If you have antibodies to HCV, you will need to have another test to find out if you are still infected. This test is called the HCV RNA qualitative test. It looks for genetic material from HCV in your blood. If you are diagnosed with an active HCV infection, you may also have an HCV RNA quantitative test to measure the amount of virus in your blood (viral load). Your health care provider may repeat this test in order to monitor you during treatment. You may also have an HCV genotype test to identify the kind (genotype) of virus you have. This helps your health care provider determine the best treatment for you. You may be tested if you show symptoms of HCV infection. It is important to be tested because hepatitis C can lead to serious liver damage if not treated. All HCV tests require a blood sample taken from a vein in your hand or arm. What do the results mean? It is your responsibility to obtain your test results. Ask the lab or department performing the test when and how you will get your results. Talk to your health care provider if you have any questions about your test results. Results of both the HCV antibody test and the HCV RNA test will be either positive or negative. Meaning of Negative Test Results  If your HCV antibody test is negative, it may mean that you have not been infected with HCV. However, it can take a few months for the antibodies to build up in your blood. If it is possible you may have been infected  recently, you may need to repeat the test.  If your HCV RNA qualitative test is negative, this means it is unlikely you have an active HCV infection. Meaning of Positive Test Results  If your HCV antibody test is positive, it is likely that you are infected or have been infected with HCV.  If your HCV RNA qualitative test is also positive, it confirms you have an active HCV infection. Talk with your health care provider to discuss your results, treatment options, and if necessary, the need for more tests. Talk with your health care provider if you have any questions about your results. This information is not intended to replace advice given to you by your health care provider. Make sure you discuss any questions you have with your health care provider. Document Released: 04/01/2004 Document Revised: 11/03/2015 Document Reviewed: 06/02/2013 Elsevier Interactive Patient Education  2018 [employer]. DTaP Vaccine (Diphtheria, Tetanus, and Pertussis): What You Need to Know 1. Why get vaccinated? Diphtheria, tetanus, and pertussis are serious diseases caused by bacteria. Diphtheria and pertussis are spread from person to person. Tetanus enters the body through cuts or wounds. DIPHTHERIA causes a thick covering in the back of the throat.  It can lead to breathing problems, paralysis, heart failure, and even death.  TETANUS (Lockjaw) causes painful tightening of the muscles, usually all over the body.  It can lead to "locking" of the jaw so  the victim cannot open his mouth or swallow. Tetanus leads to death in up to 2 out of 10 cases.  PERTUSSIS (Whooping Cough) causes coughing spells so bad that it is hard for infants to eat, drink, or breathe. These spells can last for weeks.  It can lead to pneumonia, seizures (jerking and staring spells), brain damage, and death.  Diphtheria, tetanus, and pertussis vaccine (DTaP) can help prevent these diseases. Most children who are vaccinated with DTaP  will be protected throughout childhood. Many more children would get these diseases if we stopped vaccinating. DTaP is a safer version of an older vaccine called DTP. DTP is no longer used in the [place]. 2. Who should get DTaP vaccine and when? Children should get 5 doses of DTaP vaccine, one dose at each of the following ages:  2 months  4 months  6 months  15-18 months  4-6 years  DTaP may be given at the same time as other vaccines. 3. Some children should not get DTaP vaccine or should wait  Children with minor illnesses, such as a cold, may be vaccinated. But children who are moderately or severely ill should usually wait until they recover before getting DTaP vaccine.  Any child who had a life-threatening allergic reaction after a dose of DTaP should not get another dose.  Any child who suffered a brain or nervous system disease within 7 days after a dose of DTaP should not get another dose.  Talk with your doctor if your child: ? had a seizure or collapsed after a dose of DTaP, ? cried non-stop for 3 hours or more after a dose of DTaP, ? had a fever over 105F after a dose of DTaP. Ask your doctor for more information. Some of these children should not get another dose of pertussis vaccine, but may get a vaccine without pertussis, called DT. 4. Older children and adults DTaP is not licensed for adolescents, adults, or children 38 years of age and older. But older people still need protection. A vaccine called Tdap is similar to DTaP. A single dose of Tdap is recommended for people 11 through 66 years of age. Another vaccine, called Td, protects against tetanus and diphtheria, but not pertussis. It is recommended every 10 years. There are separate Vaccine Information Statements for these vaccines. 5. What are the risks from DTaP vaccine? Getting diphtheria, tetanus, or pertussis disease is much riskier than getting DTaP vaccine. However, a vaccine, like any medicine, is  capable of causing serious problems, such as severe allergic reactions. The risk of DTaP vaccine causing serious harm, or death, is extremely small. Mild problems (common)  Fever (up to about 1 child in 4)  Redness or swelling where the shot was given (up to about 1 child in 4)  Soreness or tenderness where the shot was given (up to about 1 child in 4) These problems occur more often after the 4th and 5th doses of the DTaP series than after earlier doses. Sometimes the 4th or 5th dose of DTaP vaccine is followed by swelling of the entire arm or leg in which the shot was given, lasting 1-7 days (up to about 1 child in 4). Other mild problems include:  Fussiness (up to about 1 child in 3)  Tiredness or poor appetite (up to about 1 child in 10)  Vomiting (up to about 1 child in 29) These problems generally occur 1-3 days after the shot. Moderate problems (uncommon)  Seizure (jerking or staring) (about  1 child out of 14,000)  Non-stop crying, for 3 hours or more (up to about 1 child out of 1,000)  High fever, over 105F (about 1 child out of 16,000) Severe problems (very rare)  Serious allergic reaction (less than 1 out of a million doses)  Several other severe problems have been reported after DTaP vaccine. These include: ? Long-term seizures, coma, or lowered consciousness ? Permanent brain damage. These are so rare it is hard to tell if they are caused by the vaccine. Controlling fever is especially important for children who have had seizures, for any reason. It is also important if another family member has had seizures. You can reduce fever and pain by giving your child an aspirin-free pain reliever when the shot is given, and for the next 24 hours, following the package instructions. 6. What if there is a serious reaction? What should I look for? Look for anything that concerns you, such as signs of a severe allergic reaction, very high fever, or behavior changes. Signs of a  severe allergic reaction can include hives, swelling of the face and throat, difficulty breathing, a fast heartbeat, dizziness, and weakness. These would start a few minutes to a few hours after the vaccination. What should I do?  If you think it is a severe allergic reaction or other emergency that can't wait, call 9-1-1 or get the person to the nearest hospital. Otherwise, call your doctor.  Afterward, the reaction should be reported to the Vaccine Adverse Event Reporting System (VAERS). Your doctor might file this report, or you can do it yourself through the VAERS web site at www.vaers.SamedayNews.es, or by calling 845-518-2775. ? VAERS is only for reporting reactions. They do not give medical advice. 7. The National Vaccine Injury Compensation Program The [employer] Vaccine Injury Compensation Program (VICP) is a federal program that was created to compensate people who may have been injured by certain vaccines. Persons who believe they may have been injured by a vaccine can learn about the program and about filing a claim by calling 913-569-5575 or visiting the [employer] website at GoldCloset.com.ee. 8. How can I learn more?  Ask your doctor.  Call your local or state health department.  Contact the Centers for Disease Control and Prevention (CDC): ? Call 508 503 1631 (1-800-CDC-INFO) or ? Visit CDC's website at http://hunter.com/ CDC DTaP Vaccine (Diphtheria, Tetanus, and Pertussis) VIS (07/27/05) This information is not intended to replace advice given to you by your health care provider. Make sure you discuss any questions you have with your health care provider. Document Released: 12/25/2005 Document Revised: 11/18/2015 Document Reviewed: 11/18/2015 Elsevier Interactive Patient Education  2017 [employer]. Bone Densitometry Bone densitometry is an imaging test that uses a special X-ray to measure the amount of calcium and other minerals in your bones (bone density). This  test is also known as a bone mineral density test or dual-energy X-ray absorptiometry (DXA). The test can measure bone density at your hip and your spine. It is similar to having a regular X-ray. You may have this test to:  Diagnose a condition that causes weak or thin bones (osteoporosis).  Predict your risk of a broken bone (fracture).  Determine how well osteoporosis treatment is working.  Tell a health care provider about:  Any allergies you have.  All medicines you are taking, including vitamins, herbs, eye drops, creams, and over-the-counter medicines.  Any problems you or family members have had with anesthetic medicines.  Any blood disorders you have.  Any surgeries  you have had.  Any medical conditions you have.  Possibility of pregnancy.  Any other medical test you had within the previous 14 days that used contrast material. What are the risks? Generally, this is a safe procedure. However, problems can occur and may include the following:  This test exposes you to a very small amount of radiation.  The risks of radiation exposure may be greater to unborn children.  What happens before the procedure?  Do not take any calcium supplements for 24 hours before having the test. You can otherwise eat and drink what you usually do.  Take off all metal jewelry, eyeglasses, dental appliances, and any other metal objects. What happens during the procedure?  You may lie on an exam table. There will be an X-ray generator below you and an imaging device above you.  Other devices, such as boxes or braces, may be used to position your body properly for the scan.  You will need to lie still while the machine slowly scans your body.  The images will show up on a computer monitor. What happens after the procedure? You may need more testing at a later time. This information is not intended to replace advice given to you by your health care provider. Make sure you discuss any  questions you have with your health care provider. Document Released: 03/21/2004 Document Revised: 08/05/2015 Document Reviewed: 08/07/2013 Elsevier Interactive Patient Education  2018 [place] Maintenance, Female Adopting a healthy lifestyle and getting preventive care can go a long way to promote health and wellness. Talk with your health care provider about what schedule of regular examinations is right for you. This is a good chance for you to check in with your provider about disease prevention and staying healthy. In between checkups, there are plenty of things you can do on your own. Experts have done a lot of research about which lifestyle changes and preventive measures are most likely to keep you healthy. Ask your health care provider for more information. Weight and diet Eat a healthy diet  Be sure to include plenty of vegetables, fruits, low-fat dairy products, and lean protein.  Do not eat a lot of foods high in solid fats, added sugars, or salt.  Get regular exercise. This is one of the most important things you can do for your health. ? Most adults should exercise for at least 150 minutes each week. The exercise should increase your heart rate and make you sweat (moderate-intensity exercise). ? Most adults should also do strengthening exercises at least twice a week. This is in addition to the moderate-intensity exercise.  Maintain a healthy weight  Body mass index (BMI) is a measurement that can be used to identify possible weight problems. It estimates body fat based on height and weight. Your health care provider can help determine your BMI and help you achieve or maintain a healthy weight.  For females 66 years of age and older: ? A BMI below 18.5 is considered underweight. ? A BMI of 18.5 to 24.9 is normal. ? A BMI of 25 to 29.9 is considered overweight. ? A BMI of 30 and above is considered obese.  Watch levels of cholesterol and blood lipids  You should  start having your blood tested for lipids and cholesterol at 66 years of age, then have this test every 5 years.  You may need to have your cholesterol levels checked more often if: ? Your lipid or cholesterol levels are high. ? You are  older than 66 years of age. ? You are at high risk for heart disease.  Cancer screening Lung Cancer  Lung cancer screening is recommended for adults 79-38 years old who are at high risk for lung cancer because of a history of smoking.  A yearly low-dose CT scan of the lungs is recommended for people who: ? Currently smoke. ? Have quit within the past 15 years. ? Have at least a 30-pack-year history of smoking. A pack year is smoking an average of one pack of cigarettes a day for 1 year.  Yearly screening should continue until it has been 15 years since you quit.  Yearly screening should stop if you develop a health problem that would prevent you from having lung cancer treatment.  Breast Cancer  Practice breast self-awareness. This means understanding how your breasts normally appear and feel.  It also means doing regular breast self-exams. Let your health care provider know about any changes, no matter how small.  If you are in your 20s or 30s, you should have a clinical breast exam (CBE) by a health care provider every 1-3 years as part of a regular health exam.  If you are 97 or older, have a CBE every year. Also consider having a breast X-ray (mammogram) every year.  If you have a family history of breast cancer, talk to your health care provider about genetic screening.  If you are at high risk for breast cancer, talk to your health care provider about having an MRI and a mammogram every year.  Breast cancer gene (BRCA) assessment is recommended for women who have family members with BRCA-related cancers. BRCA-related cancers include: ? Breast. ? Ovarian. ? Tubal. ? Peritoneal cancers.  Results of the assessment will determine the need  for genetic counseling and BRCA1 and BRCA2 testing.  Cervical Cancer Your health care provider may recommend that you be screened regularly for cancer of the pelvic organs (ovaries, uterus, and vagina). This screening involves a pelvic examination, including checking for microscopic changes to the surface of your cervix (Pap test). You may be encouraged to have this screening done every 3 years, beginning at age 69.  For women ages 84-65, health care providers may recommend pelvic exams and Pap testing every 3 years, or they may recommend the Pap and pelvic exam, combined with testing for human papilloma virus (HPV), every 5 years. Some types of HPV increase your risk of cervical cancer. Testing for HPV may also be done on women of any age with unclear Pap test results.  Other health care providers may not recommend any screening for nonpregnant women who are considered low risk for pelvic cancer and who do not have symptoms. Ask your health care provider if a screening pelvic exam is right for you.  If you have had past treatment for cervical cancer or a condition that could lead to cancer, you need Pap tests and screening for cancer for at least 20 years after your treatment. If Pap tests have been discontinued, your risk factors (such as having a new sexual partner) need to be reassessed to determine if screening should resume. Some women have medical problems that increase the chance of getting cervical cancer. In these cases, your health care provider may recommend more frequent screening and Pap tests.  Colorectal Cancer  This type of cancer can be detected and often prevented.  Routine colorectal cancer screening usually begins at 66 years of age and continues through 66 years of age.  Your  health care provider may recommend screening at an earlier age if you have risk factors for colon cancer.  Your health care provider may also recommend using home test kits to check for hidden blood in  the stool.  A small camera at the end of a tube can be used to examine your colon directly (sigmoidoscopy or colonoscopy). This is done to check for the earliest forms of colorectal cancer.  Routine screening usually begins at age 70.  Direct examination of the colon should be repeated every 5-10 years through 66 years of age. However, you may need to be screened more often if early forms of precancerous polyps or small growths are found.  Skin Cancer  Check your skin from head to toe regularly.  Tell your health care provider about any new moles or changes in moles, especially if there is a change in a mole's shape or color.  Also tell your health care provider if you have a mole that is larger than the size of a pencil eraser.  Always use sunscreen. Apply sunscreen liberally and repeatedly throughout the day.  Protect yourself by wearing long sleeves, pants, a wide-brimmed hat, and sunglasses whenever you are outside.  Heart disease, diabetes, and high blood pressure  High blood pressure causes heart disease and increases the risk of stroke. High blood pressure is more likely to develop in: ? People who have blood pressure in the high end of the normal range (130-139/85-89 mm Hg). ? People who are overweight or obese. ? People who are African American.  If you are 105-5 years of age, have your blood pressure checked every 3-5 years. If you are 30 years of age or older, have your blood pressure checked every year. You should have your blood pressure measured twice-once when you are at a hospital or clinic, and once when you are not at a hospital or clinic. Record the average of the two measurements. To check your blood pressure when you are not at a hospital or clinic, you can use: ? An automated blood pressure machine at a pharmacy. ? A home blood pressure monitor.  If you are between 69 years and 22 years old, ask your health care provider if you should take aspirin to prevent  strokes.  Have regular diabetes screenings. This involves taking a blood sample to check your fasting blood sugar level. ? If you are at a normal weight and have a low risk for diabetes, have this test once every three years after 66 years of age. ? If you are overweight and have a high risk for diabetes, consider being tested at a younger age or more often. Preventing infection Hepatitis B  If you have a higher risk for hepatitis B, you should be screened for this virus. You are considered at high risk for hepatitis B if: ? You were born in a country where hepatitis B is common. Ask your health care provider which countries are considered high risk. ? Your parents were born in a high-risk country, and you have not been immunized against hepatitis B (hepatitis B vaccine). ? You have HIV or AIDS. ? You use needles to inject street drugs. ? You live with someone who has hepatitis B. ? You have had sex with someone who has hepatitis B. ? You get hemodialysis treatment. ? You take certain medicines for conditions, including cancer, organ transplantation, and autoimmune conditions.  Hepatitis C  Blood testing is recommended for: ? Everyone born from [birth year] through 1964. ?  Anyone with known risk factors for hepatitis C.  Sexually transmitted infections (STIs)  You should be screened for sexually transmitted infections (STIs) including gonorrhea and chlamydia if: ? You are sexually active and are younger than 66 years of age. ? You are older than 66 years of age and your health care provider tells you that you are at risk for this type of infection. ? Your sexual activity has changed since you were last screened and you are at an increased risk for chlamydia or gonorrhea. Ask your health care provider if you are at risk.  If you do not have HIV, but are at risk, it may be recommended that you take a prescription medicine daily to prevent HIV infection. This is called pre-exposure prophylaxis  (PrEP). You are considered at risk if: ? You are sexually active and do not regularly use condoms or know the HIV status of your partner(s). ? You take drugs by injection. ? You are sexually active with a partner who has HIV.  Talk with your health care provider about whether you are at high risk of being infected with HIV. If you choose to begin PrEP, you should first be tested for HIV. You should then be tested every 3 months for as long as you are taking PrEP. Pregnancy  If you are premenopausal and you may become pregnant, ask your health care provider about preconception counseling.  If you may become pregnant, take 400 to 800 micrograms (mcg) of folic acid every day.  If you want to prevent pregnancy, talk to your health care provider about birth control (contraception). Osteoporosis and menopause  Osteoporosis is a disease in which the bones lose minerals and strength with aging. This can result in serious bone fractures. Your risk for osteoporosis can be identified using a bone density scan.  If you are 53 years of age or older, or if you are at risk for osteoporosis and fractures, ask your health care provider if you should be screened.  Ask your health care provider whether you should take a calcium or vitamin D supplement to lower your risk for osteoporosis.  Menopause may have certain physical symptoms and risks.  Hormone replacement therapy may reduce some of these symptoms and risks. Talk to your health care provider about whether hormone replacement therapy is right for you. Follow these instructions at home:  Schedule regular health, dental, and eye exams.  Stay current with your immunizations.  Do not use any tobacco products including cigarettes, chewing tobacco, or electronic cigarettes.  If you are pregnant, do not drink alcohol.  If you are breastfeeding, limit how much and how often you drink alcohol.  Limit alcohol intake to no more than 1 drink per day for  nonpregnant women. One drink equals 12 ounces of beer, 5 ounces of wine, or 1 ounces of hard liquor.  Do not use street drugs.  Do not share needles.  Ask your health care provider for help if you need support or information about quitting drugs.  Tell your health care provider if you often feel depressed.  Tell your health care provider if you have ever been abused or do not feel safe at home. This information is not intended to replace advice given to you by your health care provider. Make sure you discuss any questions you have with your health care provider. Document Released: 09/12/2010 Document Revised: 08/05/2015 Document Reviewed: 12/01/2014 Elsevier Interactive Patient Education  [employer].

## 2018-01-02 NOTE — Addendum Note (Signed)
Addended by: Barb Merino on: 01/02/2018 04:38 PM   Modules accepted: Orders

## 2018-01-02 NOTE — Patient Instructions (Addendum)
[name] , Thank you for taking time to come for your Medicare Wellness Visit. I appreciate your ongoing commitment to your health goals. Please review the following plan we discussed and let me know if I can assist you in the future.   Screening recommendations/referrals: Colonoscopy: states she had Mammogram: up to date Bone Density: ordered Recommended yearly ophthalmology/optometry visit for glaucoma screening and checkup Recommended yearly dental visit for hygiene and checkup  Vaccinations: Influenza vaccine: declines Pneumococcal vaccine: 2010 Tdap vaccine: order sent to pharmacy Shingles vaccine: decline    Advanced directives: Advance directive discussed with you today. Even though you declined this today please call our office should you change your mind and we can give you the proper paperwork for you to fill out.   Conditions/risks identified: Obesity: States wants to start exercising regularly.   Next appointment: 01/02/2018 at 2:30p   Preventive Care 65 Years and Older, Female Preventive care refers to lifestyle choices and visits with your health care provider that can promote health and wellness. What does preventive care include?  A yearly physical exam. This is also called an annual well check.  Dental exams once or twice a year.  Routine eye exams. Ask your health care provider how often you should have your eyes checked.  Personal lifestyle choices, including:  Daily care of your teeth and gums.  Regular physical activity.  Eating a healthy diet.  Avoiding tobacco and drug use.  Limiting alcohol use.  Practicing safe sex.  Taking low-dose aspirin every day.  Taking vitamin and mineral supplements as recommended by your health care provider. What happens during an annual well check? The services and screenings done by your health care provider during your annual well check will depend on your age, overall health, lifestyle risk factors, and family  history of disease. Counseling  Your health care provider may ask you questions about your:  Alcohol use.  Tobacco use.  Drug use.  Emotional well-being.  Home and relationship well-being.  Sexual activity.  Eating habits.  History of falls.  Memory and ability to understand (cognition).  Work and work Astronomer.  Reproductive health. Screening  You may have the following tests or measurements:  Height, weight, and BMI.  Blood pressure.  Lipid and cholesterol levels. These may be checked every 5 years, or more frequently if you are over 69 years old.  Skin check.  Lung cancer screening. You may have this screening every year starting at age 20 if you have a 30-pack-year history of smoking and currently smoke or have quit within the past 15 years.  Fecal occult blood test (FOBT) of the stool. You may have this test every year starting at age 66.  Flexible sigmoidoscopy or colonoscopy. You may have a sigmoidoscopy every 5 years or a colonoscopy every 10 years starting at age 69.  Hepatitis C blood test.  Hepatitis B blood test.  Sexually transmitted disease (STD) testing.  Diabetes screening. This is done by checking your blood sugar (glucose) after you have not eaten for a while (fasting). You may have this done every 1-3 years.  Bone density scan. This is done to screen for osteoporosis. You may have this done starting at age 73.  Mammogram. This may be done every 1-2 years. Talk to your health care provider about how often you should have regular mammograms. Talk with your health care provider about your test results, treatment options, and if necessary, the need for more tests. Vaccines  Your health care  provider may recommend certain vaccines, such as:  Influenza vaccine. This is recommended every year.  Tetanus, diphtheria, and acellular pertussis (Tdap, Td) vaccine. You may need a Td booster every 10 years.  Zoster vaccine. You may need this after  age 26.  Pneumococcal 13-valent conjugate (PCV13) vaccine. One dose is recommended after age 54.  Pneumococcal polysaccharide (PPSV23) vaccine. One dose is recommended after age 72. Talk to your health care provider about which screenings and vaccines you need and how often you need them. This information is not intended to replace advice given to you by your health care provider. Make sure you discuss any questions you have with your health care provider. Document Released: 03/26/2015 Document Revised: 11/17/2015 Document Reviewed: 12/29/2014 Elsevier Interactive Patient Education  2017 [employer] Prevention in the Home Falls can cause injuries. They can happen to people of all ages. There are many things you can do to make your home safe and to help prevent falls. What can I do on the outside of my home?  Regularly fix the edges of walkways and driveways and fix any cracks.  Remove anything that might make you trip as you walk through a door, such as a raised step or threshold.  Trim any bushes or trees on the path to your home.  Use bright outdoor lighting.  Clear any walking paths of anything that might make someone trip, such as rocks or tools.  Regularly check to see if handrails are loose or broken. Make sure that both sides of any steps have handrails.  Any raised decks and porches should have guardrails on the edges.  Have any leaves, snow, or ice cleared regularly.  Use sand or salt on walking paths during winter.  Clean up any spills in your garage right away. This includes oil or grease spills. What can I do in the bathroom?  Use night lights.  Install grab bars by the toilet and in the tub and shower. Do not use towel bars as grab bars.  Use non-skid mats or decals in the tub or shower.  If you need to sit down in the shower, use a plastic, non-slip stool.  Keep the floor dry. Clean up any water that spills on the floor as soon as it  happens.  Remove soap buildup in the tub or shower regularly.  Attach bath mats securely with double-sided non-slip rug tape.  Do not have throw rugs and other things on the floor that can make you trip. What can I do in the bedroom?  Use night lights.  Make sure that you have a light by your bed that is easy to reach.  Do not use any sheets or blankets that are too big for your bed. They should not hang down onto the floor.  Have a firm chair that has side arms. You can use this for support while you get dressed.  Do not have throw rugs and other things on the floor that can make you trip. What can I do in the kitchen?  Clean up any spills right away.  Avoid walking on wet floors.  Keep items that you use a lot in easy-to-reach places.  If you need to reach something above you, use a strong step stool that has a grab bar.  Keep electrical cords out of the way.  Do not use floor polish or wax that makes floors slippery. If you must use wax, use non-skid floor wax.  Do not have throw  rugs and other things on the floor that can make you trip. What can I do with my stairs?  Do not leave any items on the stairs.  Make sure that there are handrails on both sides of the stairs and use them. Fix handrails that are broken or loose. Make sure that handrails are as long as the stairways.  Check any carpeting to make sure that it is firmly attached to the stairs. Fix any carpet that is loose or worn.  Avoid having throw rugs at the top or bottom of the stairs. If you do have throw rugs, attach them to the floor with carpet tape.  Make sure that you have a light switch at the top of the stairs and the bottom of the stairs. If you do not have them, ask someone to add them for you. What else can I do to help prevent falls?  Wear shoes that:  Do not have high heels.  Have rubber bottoms.  Are comfortable and fit you well.  Are closed at the toe. Do not wear sandals.  If you  use a stepladder:  Make sure that it is fully opened. Do not climb a closed stepladder.  Make sure that both sides of the stepladder are locked into place.  Ask someone to hold it for you, if possible.  Clearly mark and make sure that you can see:  Any grab bars or handrails.  First and last steps.  Where the edge of each step is.  Use tools that help you move around (mobility aids) if they are needed. These include:  Canes.  Walkers.  Scooters.  Crutches.  Turn on the lights when you go into a dark area. Replace any light bulbs as soon as they burn out.  Set up your furniture so you have a clear path. Avoid moving your furniture around.  If any of your floors are uneven, fix them.  If there are any pets around you, be aware of where they are.  Review your medicines with your doctor. Some medicines can make you feel dizzy. This can increase your chance of falling. Ask your doctor what other things that you can do to help prevent falls. This information is not intended to replace advice given to you by your health care provider. Make sure you discuss any questions you have with your health care provider. Document Released: 12/24/2008 Document Revised: 08/05/2015 Document Reviewed: 04/03/2014 Elsevier Interactive Patient Education  2017 [employer].

## 2018-01-03 LAB — HEPATITIS C ANTIBODY: Hep C Virus Ab: <0.1 s/co ratio (ref 0.0–0.9)

## 2018-01-03 LAB — LIPID PANEL
CHOLESTEROL TOTAL: 262 mg/dL — AB (ref 100–199)
Chol/HDL Ratio: 4 ratio (ref 0.0–4.4)
HDL: 66 mg/dL (ref >39)
LDL CALC: 143 mg/dL — AB (ref 0–99)
Triglycerides: 265 mg/dL — ABNORMAL HIGH (ref 0–149)
VLDL Cholesterol Cal: 53 mg/dL — ABNORMAL HIGH (ref 5–40)

## 2018-01-03 LAB — HEMOGLOBIN A1C
ESTIMATED AVERAGE GLUCOSE: 272 mg/dL
Hgb A1c MFr Bld: 11.1 % — ABNORMAL HIGH (ref 4.8–5.6)

## 2018-01-07 ENCOUNTER — Ambulatory Visit: Payer: Medicare Other | Admitting: Nurse Practitioner

## 2018-01-16 ENCOUNTER — Encounter: Payer: Self-pay | Admitting: Nurse Practitioner

## 2018-02-05 ENCOUNTER — Other Ambulatory Visit: Payer: Self-pay | Admitting: Nurse Practitioner

## 2018-03-05 ENCOUNTER — Encounter: Payer: Self-pay | Admitting: Nurse Practitioner

## 2018-03-12 LAB — HM DIABETES EYE EXAM

## 2018-03-21 ENCOUNTER — Other Ambulatory Visit: Payer: Self-pay | Admitting: Nurse Practitioner

## 2018-03-21 DIAGNOSIS — Z794 Long term (current) use of insulin: Principal | ICD-10-CM

## 2018-03-21 DIAGNOSIS — E119 Type 2 diabetes mellitus without complications: Secondary | ICD-10-CM

## 2018-03-21 MED ORDER — WELCHOL 625 MG PO TABS: ORAL_TABLET | ORAL | 1 refills | Status: DC

## 2018-03-21 MED ORDER — INSULIN GLARGINE-LIXISENATIDE 100-33 UNT-MCG/ML ~~LOC~~ SOPN: 20.0000 [IU] | PEN_INJECTOR | Freq: Every day | SUBCUTANEOUS | 1 refills | Status: DC

## 2018-03-28 ENCOUNTER — Encounter: Payer: Self-pay | Admitting: Nurse Practitioner

## 2018-03-28 DIAGNOSIS — E11319 Type 2 diabetes mellitus with unspecified diabetic retinopathy without macular edema: Secondary | ICD-10-CM | POA: Insufficient documentation

## 2018-04-04 ENCOUNTER — Ambulatory Visit (INDEPENDENT_AMBULATORY_CARE_PROVIDER_SITE_OTHER): Payer: Medicare Other | Admitting: Nurse Practitioner

## 2018-04-04 ENCOUNTER — Encounter: Payer: Self-pay | Admitting: Nurse Practitioner

## 2018-04-04 VITALS — BP 180/100 | HR 84 | Temp 97.5°F | Ht 60.8 in | Wt 173.8 lb

## 2018-04-04 DIAGNOSIS — M6281 Muscle weakness (generalized): Secondary | ICD-10-CM | POA: Diagnosis not present

## 2018-04-04 DIAGNOSIS — E119 Type 2 diabetes mellitus without complications: Secondary | ICD-10-CM | POA: Diagnosis not present

## 2018-04-04 DIAGNOSIS — Z79899 Other long term (current) drug therapy: Secondary | ICD-10-CM

## 2018-04-04 DIAGNOSIS — R5383 Other fatigue: Secondary | ICD-10-CM | POA: Insufficient documentation

## 2018-04-04 DIAGNOSIS — Z794 Long term (current) use of insulin: Secondary | ICD-10-CM

## 2018-04-04 DIAGNOSIS — I10 Essential (primary) hypertension: Secondary | ICD-10-CM

## 2018-04-04 MED ORDER — INSULIN DEGLUDEC-LIRAGLUTIDE 100-3.6 UNIT-MG/ML ~~LOC~~ SOPN: 16.0000 [IU] | PEN_INJECTOR | Freq: Every day | SUBCUTANEOUS | 3 refills | Status: DC

## 2018-04-04 MED ORDER — AMLODIPINE BESYLATE 10 MG PO TABS: 10.0000 mg | ORAL_TABLET | Freq: Every day | ORAL | 0 refills | Status: DC

## 2018-04-04 NOTE — Progress Notes (Signed)
Subjective:     Patient ID: Donna [name] , adult    DOB: 08-Oct-1950 , 68 y.o.   MRN: [id number]   Chief Complaint  Patient presents with  . Diabetes  . Hypertension    HPI  Diabetes  He presents for his follow-up diabetic visit. He has type 2 diabetes mellitus. No MedicAlert identification noted. Pertinent negatives for hypoglycemia include no confusion, dizziness or nervousness/anxiousness. Pertinent negatives for diabetes include no blurred vision, no chest pain, no fatigue, no polydipsia, no polyphagia and no polyuria. There are no hypoglycemic complications. Symptoms are stable. Risk factors for coronary artery disease include hypertension, sedentary lifestyle, stress and obesity. Current diabetic treatment includes oral agent (dual therapy). He is compliant with treatment most of the time. He is following a generally unhealthy diet. When asked about meal planning, he reported none. He has not had a previous visit with a dietitian. He rarely participates in exercise. Home blood sugar record trend: unknown she has not been checking due to glucometer not working. An ACE inhibitor/angiotensin II receptor blocker is being taken. He does not see a podiatrist.Eye exam is current.  Hypertension  Pertinent negatives include no blurred vision or chest pain.     Past Medical History:  Diagnosis Date  . Diabetes mellitus without complication (HCC)   . Hyperlipidemia   . Hypertension      Family History  Problem Relation Age of Onset  . Diabetes Father   . Diabetes Sister   . Breast cancer Neg Hx      Current Outpatient Medications:  .  amLODipine (NORVASC) 5 MG tablet, TAKE 1 TABLET BY MOUTH ONCE DAILY, Disp: 90 tablet, Rfl: 1 .  aspirin 325 MG tablet, Take 325 mg by mouth daily., Disp: , Rfl:  .  simvastatin (ZOCOR) 40 MG tablet, Take 1 tablet (40 mg total) by mouth every evening., Disp: 30 tablet, Rfl: 3 .  WELCHOL 625 MG tablet, Take 4 tabs daily with breakfast, Disp: 360 tablet,  Rfl: 1 .  Insulin Glargine-Lixisenatide (SOLIQUA) 100-33 UNT-MCG/ML SOPN, Inject 20 Units into the skin at bedtime. (Patient not taking: Reported on 04/04/2018), Disp: 15 pen, Rfl: 1 .  olmesartan-hydrochlorothiazide (BENICAR HCT) 40-25 MG per tablet, Take 1 tablet by mouth daily., Disp: , Rfl:    No Known Allergies   Review of Systems  Constitutional: Negative.  Negative for fatigue.  Eyes: Negative for blurred vision and visual disturbance.  Respiratory: Negative.   Cardiovascular: Negative.  Negative for chest pain.  Gastrointestinal: Negative.   Endocrine: Negative.  Negative for polydipsia, polyphagia and polyuria.  Skin: Negative.   Neurological: Negative.  Negative for dizziness.  Psychiatric/Behavioral: Negative for confusion. The patient is not nervous/anxious.      Today's Vitals   04/04/18 1432  BP: (!) 180/100  Pulse: 84  Temp: (!) 97.5 F (36.4 C)  TempSrc: Oral  SpO2: 97%  Weight: 173 lb 12.8 oz (78.8 kg)  Height: 5' 0.8" (1.544 m)  PainSc: 0-No pain   Body mass index is 33.06 kg/m.   Objective:  Physical Exam Vitals signs reviewed.  Constitutional:      Appearance: He is well-developed.  HENT:     Head: Normocephalic and atraumatic.  Eyes:     Pupils: Pupils are equal, round, and reactive to light.  Neck:     Musculoskeletal: Normal range of motion and neck supple.  Cardiovascular:     Rate and Rhythm: Normal rate and regular rhythm.     Pulses: Normal pulses.  Heart sounds: Normal heart sounds. No murmur.  Pulmonary:     Effort: Pulmonary effort is normal.     Breath sounds: Normal breath sounds.  Chest:     Chest wall: No tenderness.  Musculoskeletal: Normal range of motion.        General: Tenderness: cervical and low back.  Skin:    General: Skin is warm and dry.     Capillary Refill: Capillary refill takes less than 2 seconds.  Neurological:     General: No focal deficit present.     Mental Status: He is alert and oriented to person,  place, and time.     Cranial Nerves: No cranial nerve deficit.         Assessment And Plan:     1. Type 2 diabetes mellitus without complication, with long-term current use of insulin (HCC)  Chronic, poorly controlled  Continue with current medications, she has not been taking Soliqua at this time which was not as effective  Will try her on xultophy again and will do PA if needed  Encouraged to limit intake of sugary foods and drinks  Encouraged to increase physical activity to 150 minutes per week - Insulin Degludec-Liraglutide (XULTOPHY) 100-3.6 UNIT-MG/ML SOPN; Inject 16 Units into the skin daily.  Dispense: 5 pen; Refill: 3 - Lipid panel - CMP14 + Anion Gap - Hemoglobin A1c  2. Other long term (current) drug therapy  - TSH  3. Essential hypertension . B/P is controlled.  . CMP ordered to check renal function.  . The importance of regular exercise and dietary modification was stressed to the patient.  . Stressed importance of losing ten percent of her body weight to help with B/P control.  . The weight loss would help with decreasing cardiac and cancer risk as well.  - amLODipine (NORVASC) 10 MG tablet; Take 1 tablet (10 mg total) by mouth daily.  Dispense: 90 tablet; Refill: 0  4. Muscle weakness  She was changed from [facility] to a statin   Will check for elevated CK  Advised to take simvastatin every other day - CK, total  5. Fatigue, unspecified type  Will check for metabolic causes      Donna Felts, FNP

## 2018-04-05 ENCOUNTER — Encounter: Payer: Self-pay | Admitting: Nurse Practitioner

## 2018-04-05 LAB — CMP14 + ANION GAP
A/G RATIO: 1.2 (ref 1.2–2.2)
ALT: 28 IU/L (ref 0–32)
AST: 17 IU/L (ref 0–40)
Albumin: 3.9 g/dL (ref 3.8–4.8)
Alkaline Phosphatase: 83 IU/L (ref 39–117)
Anion Gap: 16 mmol/L (ref 10.0–18.0)
BUN/Creatinine Ratio: 23 (ref 12–28)
BUN: 23 mg/dL (ref 8–27)
Bilirubin Total: 0.2 mg/dL (ref 0.0–1.2)
CALCIUM: 9.7 mg/dL (ref 8.7–10.3)
CO2: 26 mmol/L (ref 20–29)
Chloride: 95 mmol/L — ABNORMAL LOW (ref 96–106)
Creatinine, Ser: 1 mg/dL (ref 0.57–1.00)
GFR calc non Af Amer: 59 mL/min/{1.73_m2} — ABNORMAL LOW (ref >59)
GFR, EST AFRICAN AMERICAN: 68 mL/min/{1.73_m2} (ref >59)
Globulin, Total: 3.3 g/dL (ref 1.5–4.5)
Glucose: 330 mg/dL — ABNORMAL HIGH (ref 65–99)
Potassium: 4.1 mmol/L (ref 3.5–5.2)
Sodium: 137 mmol/L (ref 134–144)
TOTAL PROTEIN: 7.2 g/dL (ref 6.0–8.5)

## 2018-04-05 LAB — LIPID PANEL
Chol/HDL Ratio: 3.2 ratio (ref 0.0–4.4)
Cholesterol, Total: 237 mg/dL — ABNORMAL HIGH (ref 100–199)
HDL: 74 mg/dL (ref >39)
LDL Calculated: 131 mg/dL — ABNORMAL HIGH (ref 0–99)
Triglycerides: 158 mg/dL — ABNORMAL HIGH (ref 0–149)
VLDL Cholesterol Cal: 32 mg/dL (ref 5–40)

## 2018-04-05 LAB — CK: Total CK: 387 U/L — ABNORMAL HIGH (ref 24–173)

## 2018-04-05 LAB — TSH: TSH: 0.568 u[IU]/mL (ref 0.450–4.500)

## 2018-04-05 LAB — HEMOGLOBIN A1C
Est. average glucose Bld gHb Est-mCnc: 280 mg/dL
Hgb A1c MFr Bld: 11.4 % — ABNORMAL HIGH (ref 4.8–5.6)

## 2018-04-05 LAB — VITAMIN B12: Vitamin B-12: 1324 pg/mL — ABNORMAL HIGH (ref 232–1245)

## 2018-04-08 ENCOUNTER — Telehealth: Payer: Self-pay

## 2018-04-08 NOTE — Telephone Encounter (Signed)
Notified pharmacy that patient's xultophy has been approved until 03/13/2019. Patient also notified. YRL,RMA

## 2018-04-09 ENCOUNTER — Encounter: Payer: Self-pay | Admitting: Nurse Practitioner

## 2018-04-09 ENCOUNTER — Telehealth: Payer: Self-pay

## 2018-04-09 NOTE — Telephone Encounter (Signed)
pharmacy notified and they stated her copay is $350 left message for pt to return call

## 2018-04-11 ENCOUNTER — Other Ambulatory Visit: Payer: Self-pay | Admitting: Nurse Practitioner

## 2018-04-11 DIAGNOSIS — E119 Type 2 diabetes mellitus without complications: Secondary | ICD-10-CM

## 2018-04-11 DIAGNOSIS — Z794 Long term (current) use of insulin: Principal | ICD-10-CM

## 2018-04-11 NOTE — Progress Notes (Signed)
re

## 2018-04-18 ENCOUNTER — Ambulatory Visit: Payer: Self-pay

## 2018-04-18 NOTE — Chronic Care Management (AMB) (Addendum)
  Chronic Care Management   Outreach Note  04/18/2018 Name: Donna [name] MRN: [id number] DOB: [birth year]  Referred by: Arnette Felts, FNP Reason for referral : Care Coordination (Initial CM introduction call )  An unsuccessful telephone outreach to [name] was attempted today. Ms. Threet was referred to the case management team by Arnette Felts, FNP for assistance with her Diabetes, Hypertension and Hyperlipidemia.   Follow Up Plan: Telephone follow up appointment with CCM team member scheduled for: and The CM team will reach out to the patient again over the next 1-2 days.    Delsa Sale, RN,CCM Care Management Coordinator [facility] Care Management/[facility]  Direct Phone: [phone]

## 2018-04-24 ENCOUNTER — Ambulatory Visit: Payer: Self-pay | Admitting: *Deleted

## 2018-04-24 DIAGNOSIS — I10 Essential (primary) hypertension: Secondary | ICD-10-CM

## 2018-04-24 DIAGNOSIS — Z794 Long term (current) use of insulin: Principal | ICD-10-CM

## 2018-04-24 DIAGNOSIS — E119 Type 2 diabetes mellitus without complications: Secondary | ICD-10-CM

## 2018-04-24 DIAGNOSIS — E785 Hyperlipidemia, unspecified: Secondary | ICD-10-CM

## 2018-04-24 NOTE — Chronic Care Management (AMB) (Signed)
  Chronic Care Management Note   Donna [name] is a 68 y.o. year old female primary care patient of Minette Brine, [place]. Ms. [name] asked the CCM team to consult the patient for assistance with chronic disease management related to DM.   Review of patient status, including review of consultants reports, relevant laboratory and other test results, and collaboration with appropriate care team members and the patient's provider was performed as part of comprehensive patient evaluation and provision of chronic care management services. Telephone outreach to patient today to introduce CCM services.   I reached out to [employer] by phone today.   Mr. [name] was given information about Chronic Care Management services today including:  1. CCM service includes personalized support from designated clinical staff supervised by his physician, including individualized plan of care and coordination with other care providers 2. 24/7 contact phone numbers for assistance for urgent and routine care needs. 3. Service will only be billed when office clinical staff spend 20 minutes or more in a month to coordinate care. 4. Only one practitioner may furnish and bill the service in a calendar month. 5. The patient may stop CCM services at any time (effective at the end of the month) by phone call to the office staff. 6. The patient will be responsible for cost sharing (co-pay) of up to 20% of the service fee (after annual deductible is met).  Patient agreed to services and verbal consent obtained.    Goals Addressed    . "I know I need some help with my sugar" (pt-stated)       Current Barriers:  Marland Kitchen Knowledge Deficits related to long term plan for management of DM . Knowledge Deficits related to recommended diet/exercise plan  Nurse Case Manager Clinical Goal(s): Over the next 90 days, patient will work with [facility] to establish long term plan of care and self health management strategies for DM disease  management.   Over the next 7 days, patient will meet in person with RN Case Manager for face to face appointment.   Interventions:   Introduced CCM services to patient  Discussed patient current goals related to DM management  Scheduled initial face to face visit   Patient Self Care Activities:  . Patient self administers medications . Patient attends scheduled provider appointments . Patient is independent with ADL's/IADL's . Patient exhibits desire to improve self health management activities  *initial goal documentation   Follow Up Plan: Face to Face appointment with CCM team member scheduled for:  04/29/18 1pm  Janalyn Shy [facility] Nurse Care Coordinator [facility] / [facility] Care Management  [phone]

## 2018-04-24 NOTE — Progress Notes (Signed)
This encounter was created in error - please disregard.  This encounter was created in error - please disregard.

## 2018-04-24 NOTE — Patient Instructions (Signed)
Visit Information  Goals    . "I know I need some help with my sugar" (pt-stated)     Current Barriers:  Marland Kitchen Knowledge Deficits related to long term plan for management of DM . Knowledge Deficits related to recommended diet/exercise plan   Nurse Case Manager Clinical Goal(s): Over the next 90 days, patient will work with [facility] to establish long term plan of care and self health management strategies for DM disease management.   Over the next 7 days, patient will meet in person with RN Case Manager for face to face appointment.   Interventions:   Introduced CCM services to patient  Discussed patient current goals related to DM management  Scheduled initial face to face visit   Patient Self Care Activities:  . Patient self administers medications . Patient attends scheduled provider appointments . Patient is independent with ADL's/IADL's . Patient exhibits desire to improve self health management activities  *initial goal documentation     . Exercise 150 min/wk Moderate Activity (pt-stated)       Education or Materials Provided:  . None today  Mr. Espiritu was given information about Chronic Care Management services today including:  1. CCM service includes personalized support from designated clinical staff supervised by his physician, including individualized plan of care and coordination with other care providers 2. 24/7 contact phone numbers for assistance for urgent and routine care needs. 3. Service will only be billed when office clinical staff spend 20 minutes or more in a month to coordinate care. 4. Only one practitioner may furnish and bill the service in a calendar month. 5. The patient may stop CCM services at any time (effective at the end of the month) by phone call to the office staff. 6. The patient will be responsible for cost sharing (co-pay) of up to 20% of the service fee (after annual deductible is met).  Patient agreed to services and verbal consent  obtained.   The patient verbalized understanding of instructions provided today and declined a print copy of patient instruction materials.   Face to Face appointment with CCM team member scheduled for:   Janalyn Shy [place] Coordinator [facility] Care Management [phone]

## 2018-04-29 ENCOUNTER — Ambulatory Visit: Payer: Self-pay | Admitting: *Deleted

## 2018-04-29 ENCOUNTER — Other Ambulatory Visit: Payer: Self-pay | Admitting: Nurse Practitioner

## 2018-04-29 ENCOUNTER — Ambulatory Visit: Payer: Self-pay

## 2018-04-29 DIAGNOSIS — I10 Essential (primary) hypertension: Secondary | ICD-10-CM

## 2018-04-29 MED ORDER — OLMESARTAN MEDOXOMIL-HCTZ 40-25 MG PO TABS: 1.0000 | ORAL_TABLET | Freq: Every day | ORAL | 1 refills | Status: DC

## 2018-04-29 NOTE — Chronic Care Management (AMB) (Signed)
  Chronic Care Management   Follow Up Note   04/29/2018 Name: Donna [name] MRN: [id number] DOB: [birth year]  Referred by: Arnette Felts, FNP Reason for referral : Chronic Care Management (telephone follow up/reschedule OV)  Subjective: 'I need a refill"   Assessment: Donna [name] is a 68 y.o. year old female primary care patient of Arnette Felts, FNP. Donna [name] asked the CCM team to consult the patient for assistance with chronic disease management related to DM.    Goals Addressed    . "I need help getting my medicine straightened out" (pt-stated)       Current Barriers:  Marland Kitchen Knowledge Deficits related to long term management of medications . Non-adherence to prescribed medication regimen - patient has knowledge deficits related to the rationale/use of each of her medications  Nurse Case Manager Clinical Goal(s):  Marland Kitchen Over the next 30 days, patient will work with [facility] and/or pharmacist to establish long term plan for self management of prescribed medications  Interventions:  . Evaluation of current treatment plan related to DM and HTN and patient's adherence to plan as established by provider. . Reviewed medications with patient and discussed her questions/concerns re: antihypertensive/diuretic. Patient states she doesn't think she is taking olmesartan-hydrochlorothiazide (BENICAR HCT) 40-25 MG per tablet any longer (listed in medical record) but is taking a diuretic only. Patient states she is currently in need of a refill.  Donna [name] with PCP regarding medication refill request.  Patient Self Care Activities:  . Self administers medications   Plan:  . RNCM will follow up with PCP and patient re: medication refill need . RNCM will schedule telephone follow up and reschedule face to face visit with patient   *initial goal documentation       Telephone follow up appointment with CCM team member scheduled for: 05/02/18   Donna [name] [facility] Nurse Care  Coordinator [facility] / [facility] Care Management [phone]

## 2018-04-29 NOTE — Patient Instructions (Signed)
Visit Information  Goals    . "I know I need some help with my sugar" (pt-stated)     Current Barriers:  Marland Kitchen Knowledge Deficits related to long term plan for management of DM . Knowledge Deficits related to recommended diet/exercise plan   Nurse Case Manager Clinical Goal(s): Over the next 90 days, patient will work with [facility] to establish long term plan of care and self health management strategies for DM disease management.   Over the next 7 days, patient will meet in person with RN Case Manager for face to face appointment.   Interventions:   Introduced CCM services to patient  Discussed patient current goals related to DM management  Scheduled initial face to face visit   Patient Self Care Activities:  . Patient self administers medications . Patient attends scheduled provider appointments . Patient is independent with ADL's/IADL's . Patient exhibits desire to improve self health management activities  *initial goal documentation     . "I need help getting my medicine straightened out" (pt-stated)     Current Barriers:  Marland Kitchen Knowledge Deficits related to long term management of medications . Non-adherence to prescribed medication regimen - patient has knowledge deficits related to the rationale/use of each of her medications   Nurse Case Manager Clinical Goal(s):  Marland Kitchen Over the next 30 days, patient will work with [facility] and/or pharmacist to establish long term plan for self management of prescribed medications  Interventions:  . Evaluation of current treatment plan related to DM and HTN and patient's adherence to plan as established by provider. . Reviewed medications with patient and discussed her questions/concerns re: antihypertensive/diuretic. Patient states she doesn't think she is taking olmesartan-hydrochlorothiazide (BENICAR HCT) 40-25 MG per tablet any longer (listed in medical record) but is taking a diuretic only. Patient states she is currently in need of a refill.   Steele Sizer with PCP regarding medication refill request.  Patient Self Care Activities:  . Self administers medications   Plan:  . RNCM will follow up with PCP and patient re: medication refill need . RNCM will schedule telephone follow up and reschedule face to face visit with patient   *initial goal documentation     . Exercise 150 min/wk Moderate Activity (pt-stated)       Education or Materials Provided:  . none  The patient verbalized understanding of instructions provided today and declined a print copy of patient instruction materials.   Telephone follow up appointment with CCM team member scheduled for: 05/02/18  Marja Kays [facility] Nurse Care Coordinator [facility] Care Management [phone]

## 2018-05-02 ENCOUNTER — Telehealth: Payer: Self-pay

## 2018-05-02 ENCOUNTER — Ambulatory Visit: Payer: Medicare Other | Admitting: Nurse Practitioner

## 2018-05-02 ENCOUNTER — Ambulatory Visit: Payer: Self-pay

## 2018-05-02 DIAGNOSIS — Z794 Long term (current) use of insulin: Secondary | ICD-10-CM

## 2018-05-02 DIAGNOSIS — E119 Type 2 diabetes mellitus without complications: Secondary | ICD-10-CM

## 2018-05-02 DIAGNOSIS — I10 Essential (primary) hypertension: Secondary | ICD-10-CM

## 2018-05-02 DIAGNOSIS — E785 Hyperlipidemia, unspecified: Secondary | ICD-10-CM

## 2018-05-02 NOTE — Chronic Care Management (AMB) (Signed)
  Care Management   Follow Up Note   05/02/2018 Name: Donna [name] MRN: [id number] DOB: 1951/02/12  Referred by: Arnette Felts, FNP Reason for referral : Chronic Care Management (Telephone Outreach Re: CCM face to face visit)   Chronic Care Management   Outreach Note  05/02/2018 Name: Donna [name] MRN: [id number] DOB: 23-Aug-1950  Referred by: Arnette Felts, FNP Reason for referral : Chronic Care Management (Telephone Outreach Re: CCM face to face visit)  An unsuccessful telephone outreach to Donna [name] was attempted today regarding the need to coordinate her initial CCM face to face visit with her PCP visit, rescheduled for next week on 05/06/18 @2 :00 PM. Donna [name] was referred to the case management team by Arnette Felts, FNP for assistance with chronic disease management related to DM.   I left a voice message for the patient to return the call if needed but notified her via voice message that her initial CCM face to face visit has been moved up to 05/06/18 immediately following her PCP visit with Arnette Felts, FNP, set for 2:00 PM. Provided my contact number and availability for a call back if needed.    Follow Up Plan: Face to Face appointment with CCM team member scheduled for: 05/06/18 Immediately following 2:00 PM visit with Arnette Felts, FNP   Delsa Sale, RN,CCM Care Management Coordinator [facility] Care Management/[facility]  Direct Phone: [phone]

## 2018-05-06 ENCOUNTER — Ambulatory Visit (INDEPENDENT_AMBULATORY_CARE_PROVIDER_SITE_OTHER): Payer: Medicare Other

## 2018-05-06 ENCOUNTER — Encounter: Payer: Self-pay | Admitting: Nurse Practitioner

## 2018-05-06 ENCOUNTER — Ambulatory Visit (INDEPENDENT_AMBULATORY_CARE_PROVIDER_SITE_OTHER): Payer: Medicare Other | Admitting: Nurse Practitioner

## 2018-05-06 VITALS — BP 168/90 | HR 89 | Temp 97.8°F | Ht 61.2 in | Wt 173.8 lb

## 2018-05-06 DIAGNOSIS — I10 Essential (primary) hypertension: Secondary | ICD-10-CM

## 2018-05-06 DIAGNOSIS — E785 Hyperlipidemia, unspecified: Secondary | ICD-10-CM | POA: Diagnosis not present

## 2018-05-06 DIAGNOSIS — Z794 Long term (current) use of insulin: Secondary | ICD-10-CM

## 2018-05-06 DIAGNOSIS — E119 Type 2 diabetes mellitus without complications: Secondary | ICD-10-CM | POA: Diagnosis not present

## 2018-05-06 NOTE — Progress Notes (Signed)
Subjective:     Patient ID: Donna [name] , adult    DOB: 1950-09-06 , 68 y.o.   MRN: [id number]   Chief Complaint  Patient presents with  . Hypertension    HPI  Hypertension  This is a chronic problem. The current episode started more than 1 year ago. The problem is unchanged. Pertinent negatives include no chest pain, headaches or palpitations. There are no associated agents to hypertension. Risk factors for coronary artery disease include sedentary lifestyle, diabetes mellitus and obesity. Past treatments include calcium channel blockers, angiotensin blockers and diuretics. There are no compliance problems.  There is no history of angina. There is no history of chronic renal disease.     Past Medical History:  Diagnosis Date  . Diabetes mellitus without complication (HCC)   . Hyperlipidemia   . Hypertension      Family History  Problem Relation Age of Onset  . Diabetes Father   . Diabetes Sister   . Breast cancer Neg Hx      Current Outpatient Medications:  .  amLODipine (NORVASC) 10 MG tablet, Take 1 tablet (10 mg total) by mouth daily., Disp: 90 tablet, Rfl: 0 .  aspirin 325 MG tablet, Take 325 mg by mouth daily., Disp: , Rfl:  .  Insulin Degludec-Liraglutide (XULTOPHY) 100-3.6 UNIT-MG/ML SOPN, Inject 16 Units into the skin daily., Disp: 5 pen, Rfl: 3 .  olmesartan-hydrochlorothiazide (BENICAR HCT) 40-25 MG tablet, Take 1 tablet by mouth daily., Disp: 90 tablet, Rfl: 1 .  simvastatin (ZOCOR) 40 MG tablet, Take 1 tablet (40 mg total) by mouth every evening., Disp: 30 tablet, Rfl: 3   No Known Allergies   Review of Systems  Constitutional: Negative.  Negative for fatigue.  Eyes: Negative for photophobia.  Respiratory: Negative for cough.   Cardiovascular: Negative for chest pain, palpitations and leg swelling.  Gastrointestinal: Negative.   Endocrine: Negative for polydipsia, polyphagia and polyuria.  Musculoskeletal: Negative.   Skin: Negative.   Neurological:  Negative for dizziness and headaches.     Today's Vitals   05/06/18 1435  BP: (!) 168/90  Pulse: 89  Temp: 97.8 F (36.6 C)  TempSrc: Oral  SpO2: 97%  Weight: 173 lb 12.8 oz (78.8 kg)  Height: 5' 1.2" (1.554 m)   Body mass index is 32.62 kg/m.   Objective:  Physical Exam Vitals signs reviewed.  Constitutional:      Appearance: Normal appearance.  Cardiovascular:     Rate and Rhythm: Normal rate and regular rhythm.     Pulses: Normal pulses.     Heart sounds: Normal heart sounds. No murmur.  Pulmonary:     Effort: Pulmonary effort is normal. No respiratory distress.     Breath sounds: Normal breath sounds.  Skin:    General: Skin is warm and dry.  Neurological:     General: No focal deficit present.     Mental Status: He is alert and oriented to person, place, and time.  Psychiatric:        Mood and Affect: Mood normal.        Behavior: Behavior normal.        Thought Content: Thought content normal.        Judgment: Judgment normal.         Assessment And Plan:    1. Essential hypertension . B/P remains elevated yet better than her previous visit, I have advised her to take her Benicar in the morning and her Amlodipine in the evening.  Marland Kitchen  The importance of regular exercise and dietary modification was stressed to the patient.  . She is seeing [place] with CCM today  Arnette Felts, FNP

## 2018-05-07 ENCOUNTER — Encounter: Payer: Self-pay | Admitting: Nurse Practitioner

## 2018-05-07 NOTE — Chronic Care Management (AMB) (Signed)
Chronic Care Management   Initial Visit Note  05/07/2018 Name: Donna [name] MRN: [id number] DOB: 11-Sep-1950  Referred by: Minette Brine, FNP Reason for referral : Chronic Care Management (INITIAL FACE TO FACE VISIT)   Subjective: "I need help paying for my Xultrophy"  Objective: Lab Results  Component Value Date   HGBA1C 11.4 (H) 04/04/2018   HGBA1C 11.1 (H) 01/02/2018   HGBA1C 9.7 10/12/2017   Lab Results  Component Value Date   MICROALBUR 150 01/02/2018   LDLCALC 131 (H) 04/04/2018   CREATININE 1.00 04/04/2018   BP Readings from Last 3 Encounters:  05/06/18 (!) 168/90  04/04/18 (!) 180/100  01/02/18 (!) 158/90    Last Annual Wellness Visit Date: 01/02/18  Next Scheduled Annual Wellness Visit Date: 01/08/19  # ED visits/last 6 months:  0 # IP Hospitalizations/ last 6 months:  0  Assessment: Donna [name] a 68 y.o.year old female primary care patient of[place], Doreene Burke, [place].Donna [name] the CCM team to consult the patient for assistance with chronic disease management related to DM, Hypertension and medication management.   Review of patient status, including review of consultants reports, relevant laboratory and other test results, and collaboration with appropriate care team members and the patient's provider was performed as part of comprehensive patient evaluation and provision of chronic care management services.     I met with Donna [name] for an initial CCM face to face visit today.   Goals Addressed    . "I know I need some help with my sugar" (pt-stated)       Current Barriers:  Marland Kitchen Knowledge Deficits related to long term plan for management of DM . Knowledge Deficits related to recommended diet/exercise plan . Glucometer currently not working   Nurse Case Electrical engineer): Over the next 90 days, patient will work with [facility] to establish long term plan of care and self health management strategies for DM disease management.   Interventions:    . Face to Face interview with patient regarding knowledge about DM and self-health management  Discussed patients current goals related to DM management . Review of most recent laboratory data related to DM . Education provided about s/s of hypo & hyperglycemia . Discussion about emergency measures related to DM and how to self manage and when to call the doctor if needed . Provided patient with documentation and education tool (THN blue notebook)  Provided written instructions for Diabetic education and support class offered by [facility]   Assessed knowledge/understanding and comfort level with self-monitoring CBG's (pt currently not checking; will bring glucometer in to have staff check functionality)  Checked CBG in office, non-fasting = 229, Minette Brine, FNP notified  Instructed patient on best time to monitor glucose levels, preprandial and or if feeling symptomatic  Assessed for knowledge and understanding of importance of daily foot inspection (pt inspects every night)  Assessed for adherence to annual eye exam  Scheduled telephone CCM follow-up call   Patient Self Care Activities:   Verbalizes understanding of eduction/information given today  Patient is currently unable to check CBG's due to glucometer is not working correctly . Patient self administers medications . Patient attends scheduled provider appointments . Patient is independent with ADL's/IADL's . Patient exhibits desire to improve self health management activities  Please see past updates related to this goal by clicking on the "Past Updates" button in the selected goal      . "I need help getting my medicine straightened out" (pt-stated)  Current Barriers:  Marland Kitchen Knowledge Deficits related to long term management of medications . Financial restraints; out of pocket cost for Xultrophy is $350 per 30 day supply  Nurse Case Manager Clinical Goal(s):  Marland Kitchen Over the next 30 days, patient will work with  [facility] and/or pharmacist to establish long term plan for self management of prescribed medications  Interventions:   Face to Face CCM initial visit completed today . Evaluation of current treatment plan related to DM and HTN; assessed for adherence to plan as established by provider. . Reviewed medications with patient; medication reconciliation completed  Referral to clinical pharmacist to assist with cost of Xultrophy via in basket message  Collaborated with Minette Brine, FNP re: how to instruct pt to take Xultrophy following a missed dose  Instructed patient to monitor CBG the am following her missed nighttime dose and if in acceptable range (above 100), pt should take Xultrophy as prescribed  Scheduled telephone follow-up call with patient  Patient Self Care Activities:   Verbalizes understanding of instructions/information provided today  . Self administers medications   Plan:  Follow up to ensure clinical pharmacist Lottie Dawson is able to assist patient with resources that may help with lowering the cost of Xultrophy  Please see past updates related to this goal by clicking on the "Past Updates" button in the selected goal     Goal:  Patient unsure how to take BP med  Current Barriers:  Marland Kitchen Knowledge Deficits related to understanding of how to take BP med   Nurse Case Manager Clinical Goal(s):  Marland Kitchen Over the next 30 days, patient will demonstrate improved adherence to prescribed treatment plan for Hypertension as evidenced by patient will verbalize taking her BP medication as prescribed; BP will be WNL   Interventions:  . Collaborated with Minette Brine, FNP  regarding Benicar/dose frequency. . Medication reconciliation completed, patient verbalizes understanding about how to take BP med . Scheduled CCM follow-up call with patient    Patient Self Care Activities:   Verbalizes understanding of instructions/information provided by [facility] . Self administers medications as  prescribed . Calls pharmacy for medication refills . Calls provider office for new concerns or questions  Plan:  . RNCM will Telephone CCM follow-up call scheduled . RNCM will assess for adherence with taking BP medication exactly as prescribed . Patient will RNCM will encouraged patient to come into PCP office for BP check in 1-2 weeks  *initial goal documentation  Telephone follow up appointment with CCM team member scheduled for: in 1-2 weeks  Ms. Durney was given information about Chronic Care Management services today including:  1. CCM service includes personalized support from designated clinical staff supervised by her physician, including individualized plan of care and coordination with other care providers 2. 24/7 contact phone numbers for assistance for urgent and routine care needs. 3. Service will only be billed when office clinical staff spend 20 minutes or more in a month to coordinate care. 4. Only one practitioner may furnish and bill the service in a calendar month. 5. The patient may stop CCM services at any time (effective at the end of the month) by phone call to the office staff. 6. The patient will be responsible for cost sharing (co-pay) of up to 20% of the service fee (after annual deductible is met).  Patient agreed to services and verbal consent obtained.   Barb Merino, RN,CCM Care Management Coordinator [place] Management/[facility]  Direct Phone: [phone]

## 2018-05-07 NOTE — Patient Instructions (Signed)
Visit Information  Goals    . "I know I need some help with my sugar" (pt-stated)     Current Barriers:  Marland Kitchen Knowledge Deficits related to long term plan for management of DM . Knowledge Deficits related to recommended diet/exercise plan . Glucometer currently not working   Nurse Case Doctor, hospital): Over the next 90 days, patient will work with [facility] to establish long term plan of care and self health management strategies for DM disease management.   Interventions:  . Face to Face interview with patient regarding knowledge about DM and self-health management  Discussed patients current goals related to DM management . Review of most recent laboratory data related to DM . Education provided about s/s of hypo & hyperglycemia . Discussion about emergency measures related to DM and how to self manage and when to call the doctor if needed . Provided patient with documentation and education tool (THN blue notebook)  Provided written instructions for Diabetic education and support class offered by [facility]   Assessed knowledge/understanding and comfort level with self-monitoring CBG's (pt currently not checking; will bring glucometer in to have staff check functionality)  Checked CBG in office, non-fasting = 229, Arnette Felts, FNP notified  Instructed patient on best time to monitor glucose levels, preprandial and or if feeling symptomatic  Assessed for knowledge and understanding of importance of daily foot inspection (pt inspects every night)  Assessed for adherence to annual eye exam  Assisted patient with setting alarms on her cell phone as reminders for when to take Xultrophy  Scheduled telephone CCM follow-up call   Patient Self Care Activities:   Verbalizes understanding of eduction/information given today  Patient is currently unable to check CBG's due to glucometer is not working correctly . Patient self administers medications . Patient attends scheduled  provider appointments . Patient is independent with ADL's/IADL's . Patient exhibits desire to improve self health management activities  Please see past updates related to this goal by clicking on the "Past Updates" button in the selected goal      . "I need help getting my medicine straightened out" (pt-stated)     Current Barriers:  Marland Kitchen Knowledge Deficits related to long term management of medications . Financial restraints; out of pocket cost for Xultrophy is $350 per 30 day supply   Nurse Case Manager Clinical Goal(s):  Marland Kitchen Over the next 30 days, patient will work with [facility] and/or pharmacist to establish long term plan for self management of prescribed medications  Interventions:   Face to Face CCM initial visit completed today . Evaluation of current treatment plan related to DM and HTN; assessed for adherence to plan as established by provider. . Reviewed medications with patient; medication reconciliation completed  Referral to clinical pharmacist to assist with cost of Xultrophy via in basket message  Collaborated with Arnette Felts, FNP re: how to instruct pt to take Xultrophy following a missed dose  Instructed patient to monitor CBG the am following her missed nighttime dose and if in acceptable range (above 100), pt should take Xultrophy as prescribed  Scheduled telephone follow-up call with patient  Patient Self Care Activities:   Verbalizes understanding of instructions/information provided today  . Self administers medications   Plan:  Follow up to ensure clinical pharmacist Vanice Sarah is able to assist patient with resources that may help with lowering the cost of Xultrophy    Please see past updates related to this goal by clicking on the "Past Updates" button in  the selected goal      . Patient unsure how to take BP med     Current Barriers:  Marland Kitchen Knowledge Deficits related to understanding of how to take BP med   Nurse Case Manager Clinical Goal(s):   Marland Kitchen Over the next 30 days, patient will demonstrate improved adherence to prescribed treatment plan for Hypertension as evidenced by patient will verbalize taking her BP medication as prescribed; BP will be WNL   Interventions:  . Collaborated with Arnette Felts, FNP  regarding Benicar/dose frequency. . Medication reconciliation completed, patient verbalizes understanding about how to take BP med . Scheduled CCM follow-up call with patient    Patient Self Care Activities:   Verbalizes understanding of instructions/information provided by [facility] . Self administers medications as prescribed . Calls pharmacy for medication refills . Calls provider office for new concerns or questions  Plan:  . RNCM will Telephone CCM follow-up call scheduled . RNCM will assess for adherence with taking BP medication exactly as prescribed . Patient will RNCM will encouraged patient to come into PCP office for BP check in 1-2 weeks  *initial goal documentation     The patient verbalized understanding of instructions provided today and declined a print copy of patient instruction materials.   Telephone follow up appointment with CCM team member scheduled for: within 1-2 weeks  Delsa Sale, [facility] Care Management Coordinator [facility] Care Management/[facility]  Direct Phone: [phone]

## 2018-05-10 ENCOUNTER — Other Ambulatory Visit: Payer: Self-pay

## 2018-05-10 DIAGNOSIS — E119 Type 2 diabetes mellitus without complications: Secondary | ICD-10-CM

## 2018-05-10 DIAGNOSIS — Z794 Long term (current) use of insulin: Principal | ICD-10-CM

## 2018-05-10 MED ORDER — BLOOD GLUCOSE MONITOR KIT: PACK | 0 refills | Status: AC

## 2018-05-20 ENCOUNTER — Ambulatory Visit: Payer: Self-pay | Admitting: Pharmacist

## 2018-05-21 ENCOUNTER — Telehealth: Payer: Self-pay

## 2018-05-27 ENCOUNTER — Ambulatory Visit: Payer: Medicare Other | Admitting: Pharmacist

## 2018-06-03 ENCOUNTER — Ambulatory Visit: Payer: Self-pay | Admitting: Pharmacist

## 2018-06-05 ENCOUNTER — Ambulatory Visit: Payer: Self-pay | Admitting: Pharmacist

## 2018-06-10 ENCOUNTER — Telehealth: Payer: Self-pay

## 2018-06-11 ENCOUNTER — Telehealth: Payer: Self-pay

## 2018-06-12 ENCOUNTER — Telehealth: Payer: Self-pay

## 2018-06-13 ENCOUNTER — Ambulatory Visit (INDEPENDENT_AMBULATORY_CARE_PROVIDER_SITE_OTHER): Payer: Medicare Other | Admitting: Pharmacist

## 2018-06-13 ENCOUNTER — Other Ambulatory Visit: Payer: Self-pay

## 2018-06-13 DIAGNOSIS — E119 Type 2 diabetes mellitus without complications: Secondary | ICD-10-CM

## 2018-06-13 DIAGNOSIS — I10 Essential (primary) hypertension: Secondary | ICD-10-CM | POA: Diagnosis not present

## 2018-06-13 NOTE — Patient Instructions (Signed)
Visit Information  Goals Addressed            This Visit's Progress   . I need help paying for my medications (pt-stated)       Current Barriers:  . Financial Barriers  Pharmacist Clinical Goal(s):  Marland Kitchen Over the next 30 days, patient will work with pharmacist to address needs related to patient assistance program Tula Nakayama)  Interventions: . Comprehensive medication review performed. . Collaboration with provider re: medication management . Collaboration with CPhT Lilla Shook to help with patient assistance  Patient Self Care Activities:  . Self administers medications as prescribed . Attends all scheduled provider appointments . Calls pharmacy for medication refills  Initial goal documentation     . I need to control & monitor my blood sugar (pt-stated)       Current Barriers:  . Patient did not have BG meter-->recently purchased Reli[employer] meter from [employer] (RX for BG meter was called in to [place] on 05/10/18, but they had no record-will address)  Pharmacist Clinical Goal(s):  Marland Kitchen Over the next 30 days, patient will work with CM team to address needs related to blood sugar control  Interventions: . Comprehensive medication review performed. . Advised patient to continue eating DM healthy foods.  She states she is eating more non-white/yellow/starchy vegetables and can tell a difference.  Patient reported FBGs of 133, 124.  Encouraged her to keep up the great work! . Discussed plans with patient for ongoing care management follow up and provided patient with direct contact information for care management team  Patient Self Care Activities:  . Self administers medications as prescribed . Attends all scheduled provider appointments . Calls pharmacy for medication refills  Initial goal documentation        The patient verbalized understanding of instructions provided today and declined a print copy of patient instruction materials.   The CM team will reach out to the  patient again over the next 21 days.    Kieth Brightly, PharmD, BCPS Clinical Pharmacist, [facility]  [facility]  Direct Dial: [phone]

## 2018-06-13 NOTE — Progress Notes (Signed)
Chronic Care Management   Initial Visit Note  06/13/2018 Name: Donna [name] MRN: [id number] DOB: Sep 14, 1950  Referred by: Minette Brine, FNP Reason for referral : Chronic Care Management-->medication assistance & management  Donna [name] is a 68 y.o. year old adult who is a primary care patient of Minette Brine, [place]. The CCM team was consulted for assistance with chronic disease management and care coordination needs.   Review of patient status, including review of consultants reports, relevant laboratory and other test results, and collaboration with appropriate care team members and the patient's provider was performed as part of comprehensive patient evaluation and provision of chronic care management services.    Subjective:   Does the patient  feel that his/her medications are working for him/her?  yes Has the patient been experiencing any side effects to the medications prescribed?  No; denies myopathy with decreased statin dose Does the patient measure his/her own blood glucose at home?  Yes; had to purchase ReliON meter--will address Does the patient measure his/her own blood pressure at home? no  Does the patient have any problems obtaining medications due to transportation or finances?   yes  Understanding of regimen: good Understanding of indications: good Potential of compliance: good  Objective: Lab Results  Component Value Date   CREATININE 1.00 04/04/2018   CREATININE 1.0 10/12/2017   CREATININE 0.92 07/12/2009    Lab Results  Component Value Date   HGBA1C 11.4 (H) 04/04/2018    Lipid Panel     Component Value Date/Time   CHOL 237 (H) 04/04/2018 1536   TRIG 158 (H) 04/04/2018 1536   HDL 74 04/04/2018 1536   CHOLHDL 3.2 04/04/2018 1536   LDLCALC 131 (H) 04/04/2018 1536    BP Readings from Last 3 Encounters:  05/06/18 (!) 168/90  04/04/18 (!) 180/100  01/02/18 (!) 158/90   Medications Reviewed Today    Reviewed by Lavera Guise, [place]  (Pharmacist) on 06/13/18 at 1551  Med List Status: <None>  Medication Order Taking? Sig Documenting Provider Last Dose Status Informant  amLODipine (NORVASC) 10 MG tablet [id number] Yes Take 1 tablet (10 mg total) by mouth daily. Minette Brine, FNP Taking Active   aspirin 325 MG tablet [id number] Yes Take 325 mg by mouth daily. [provider] Taking Active   blood glucose meter kit and supplies KIT [id number] Yes Dispense based on patient and insurance preference. Use up to four times daily as directed. (FOR ICD-9 250.00, 250.01). Minette Brine, FNP Taking Active   Insulin Degludec-Liraglutide (XULTOPHY) 100-3.6 UNIT-MG/ML SOPN [id number] Yes Inject 16 Units into the skin daily. Minette Brine, FNP Taking Active   olmesartan-hydrochlorothiazide [facility] HCT) 40-25 MG tablet [id number] Yes Take 1 tablet by mouth daily. Minette Brine, FNP Taking Active   simvastatin (ZOCOR) 40 MG tablet [id number] Yes Take 1 tablet (40 mg total) by mouth every evening. Robbie Lis, MD Taking Active          Assessment:   Total Number of meds:  ___5_______ (polypharmacy > 10 meds)  Indications for all medications: [x]  Yes       []  No  Adherence Review  []  Excellent (no doses missed/week)     [x]  Good (no more than 1 dose missed/week)     []  Partial (2-3 doses missed/week)     []  Poor (>3 doses missed/week) Goals Addressed            This Visit's Progress   . I need help paying for my medications (pt-stated)  Current Barriers:  . Financial Barriers  Pharmacist Clinical Goal(s):  Marland Kitchen Over the next 30 days, patient will work with pharmacist to address needs related to patient assistance program Claris Che)  Interventions: . Comprehensive medication review performed. . Collaboration with provider re: medication management . Collaboration with CPhT Etter Sjogren to help with patient assistance  Patient Self Care Activities:  . Self administers medications as prescribed . Attends all scheduled  provider appointments . Calls pharmacy for medication refills  Initial goal documentation   . I need to control & monitor my blood sugar (pt-stated)       Current Barriers:  . Patient did not have BG meter-->recently purchased ReliON meter from [place] (RX for BG meter was called in to [place] on 05/10/18, but they had no record-will address)  Pharmacist Clinical Goal(s):  Marland Kitchen Over the next 30 days, patient will work with CM team to address needs related to blood sugar control  Interventions: . Comprehensive medication review performed. . Advised patient to continue eating DM healthy foods.  She states she is eating more non-white/yellow/starchy vegetables and can tell a difference.  Patient reported FBGs of 133, 124.  Encouraged her to keep up the great work! . Discussed plans with patient for ongoing care management follow up and provided patient with direct contact information for care management team  Patient Self Care Activities:  . Able to prepare DM health meals/snacks  Initial goal documentation     Plan: -The CM Pharmacist will reach out to the patient again over the next 21 days.  -Will follow up on BG meter -Will mail patient assistance application to patient -Will mail [employer] OTC catalog to patient  Regina Eck, PharmD, [place] Pharmacist, [facility] [place]: [phone]

## 2018-07-08 ENCOUNTER — Other Ambulatory Visit: Payer: Self-pay

## 2018-07-08 ENCOUNTER — Ambulatory Visit (INDEPENDENT_AMBULATORY_CARE_PROVIDER_SITE_OTHER): Payer: Medicare Other | Admitting: Nurse Practitioner

## 2018-07-08 ENCOUNTER — Encounter: Payer: Self-pay | Admitting: Nurse Practitioner

## 2018-07-08 VITALS — BP 142/90 | HR 76 | Temp 98.0°F | Ht 60.8 in | Wt 168.4 lb

## 2018-07-08 DIAGNOSIS — E119 Type 2 diabetes mellitus without complications: Secondary | ICD-10-CM | POA: Diagnosis not present

## 2018-07-08 DIAGNOSIS — M6281 Muscle weakness (generalized): Secondary | ICD-10-CM

## 2018-07-08 DIAGNOSIS — Z794 Long term (current) use of insulin: Secondary | ICD-10-CM

## 2018-07-08 DIAGNOSIS — I10 Essential (primary) hypertension: Secondary | ICD-10-CM

## 2018-07-08 DIAGNOSIS — E785 Hyperlipidemia, unspecified: Secondary | ICD-10-CM | POA: Diagnosis not present

## 2018-07-08 DIAGNOSIS — W19XXXA Unspecified fall, initial encounter: Secondary | ICD-10-CM

## 2018-07-08 NOTE — Progress Notes (Signed)
Subjective:     Patient ID: Donna [name] , female    DOB: 01-Oct-1950 , 69 y.o.   MRN: [id number]   Chief Complaint  Patient presents with  . Fall    HAD A FALL AT WORK LAST MONDAY. [employer]. NO PAIN. THEY [place].    HPI  [place] at work 1 week ago, went to put lunch bag and she lost her footing.  She had called last week to return to work.  No injury.  Denies passing out. She works at [employer].    She feels like her muscles are weak.    Diabetes  She presents for her follow-up diabetic visit. She has type 2 diabetes mellitus. Her disease course has been worsening. Associated symptoms include weakness. Pertinent negatives for diabetes include no blurred vision, no chest pain, no fatigue, no polydipsia, no polyphagia and no polyuria.  Hypertension  This is a chronic problem. The current episode started more than 1 year ago. The problem is unchanged. The problem is uncontrolled. Pertinent negatives include no anxiety, blurred vision, chest pain or shortness of breath. There are no associated agents to hypertension. Risk factors for coronary artery disease include sedentary lifestyle.     Past Medical History:  Diagnosis Date  . Diabetes mellitus without complication ([place])   . Hyperlipidemia   . Hypertension      Family History  Problem Relation Age of Onset  . Diabetes Father   . Diabetes Sister   . Breast cancer Neg Hx      Current Outpatient Medications:  .  amLODipine (NORVASC) 10 MG tablet, Take 1 tablet (10 mg total) by mouth daily., Disp: 90 tablet, Rfl: 0 .  aspirin 325 MG tablet, Take 325 mg by mouth daily., Disp: , Rfl:  .  blood glucose meter kit and supplies KIT, Dispense based on patient and insurance preference. Use up to four times daily as directed. (FOR ICD-9 250.00, 250.01)., Disp: 1 each, Rfl: 0 .  Insulin Degludec-Liraglutide (XULTOPHY) 100-3.6 UNIT-MG/ML SOPN, Inject 16 Units into the skin daily., Disp: 5 pen, Rfl: 3 .   olmesartan-hydrochlorothiazide (BENICAR HCT) 40-25 MG tablet, Take 1 tablet by mouth daily., Disp: 90 tablet, Rfl: 1 .  simvastatin (ZOCOR) 40 MG tablet, Take 1 tablet (40 mg total) by mouth every evening. (Patient not taking: Reported on 07/08/2018), Disp: 30 tablet, Rfl: 3   No Known Allergies   Review of Systems  Constitutional: Negative for fatigue.  Eyes: Negative for blurred vision.  Respiratory: Negative for shortness of breath.   Cardiovascular: Negative for chest pain.  Endocrine: Negative for polydipsia, polyphagia and polyuria.  Neurological: Positive for weakness.     Today's Vitals   07/08/18 1211  BP: (!) 142/90  Pulse: 76  Temp: 98 F (36.7 C)  TempSrc: Oral  SpO2: 97%  Weight: 168 lb 6.4 oz (76.4 kg)  Height: 5' 0.8" (1.544 m)   Body mass index is 32.03 kg/m.   Objective:  Physical Exam Constitutional:      Appearance: Normal appearance.  HENT:     Head: Normocephalic and atraumatic.  Eyes:     Extraocular Movements: Extraocular movements intact.  Cardiovascular:     Rate and Rhythm: Normal rate and regular rhythm.     Pulses: Normal pulses.     Heart sounds: Normal heart sounds. No murmur.  Pulmonary:     Effort: Pulmonary effort is normal. No respiratory distress.     Breath sounds: Normal breath sounds.  Skin:    General: Skin is warm and dry.     Capillary Refill: Capillary refill takes less than 2 seconds.  Neurological:     General: No focal deficit present.     Mental Status: She is alert and oriented to person, place, and time.     Cranial Nerves: No cranial nerve deficit.     Motor: No weakness.     Gait: Gait normal.     Deep Tendon Reflexes: Reflexes abnormal (bilateral knee reflex diminished and ankle jerk 0 left and right ankle jerk 1+). Babinski sign present on the right side. Babinski sign absent on the left side.     Reflex Scores:      Patellar reflexes are 1+ on the right side and 1+ on the left side.      Achilles reflexes are  1+ on the right side and 0 on the left side.    Comments: Right babinski great toe upward  Psychiatric:        Mood and Affect: Mood normal.        Behavior: Behavior normal.        Thought Content: Thought content normal.        Judgment: Judgment normal.         Assessment And Plan:      1. Type 2 diabetes mellitus without complication, with long-term current use of insulin (HCC)  Chronic, poorly controlled  She says her blood sugars are more controlled.   Will check H  Continue with current medications  Encouraged to limit intake of sugary foods and drinks  Encouraged to increase physical activity to 150 minutes per week - Hemoglobin A1c  2. Essential hypertension . B/P is poorly controlled, she has not taken her medication today yet, advised to make sure to take when she goes home.  . CMP ordered to check renal function.  . The importance of regular exercise and dietary modification was stressed to the patient.  . Stressed importance of losing ten percent of her body weight to help with B/P control.  . The weight loss would help with decreasing cardiac and cancer risk as well.  - BMP8+eGFR  3. Hyperlipidemia, unspecified hyperlipidemia type  Chronic, controlled  Continue with current medications, she is taking her statin every other day had elevated CK at last visit - Lipid panel  4. Muscle weakness (generalized)  No weakness noted with strength  Diminished patellar and achilles reflexes and babinski right foot great toe upward  Will order CT scan head to evaluate for structural damage.  - CK, total - CT Head Wo Contrast; Future  5. Fall, initial encounter  While at work last week had a fall losing her footing denies loss of conscious or any symptoms when occurred.  She did not see a worker's comp provider  Concerned due to her weakness and fall evaluate her brain   Minette Brine, FNP    THE PATIENT IS ENCOURAGED TO PRACTICE SOCIAL DISTANCING DUE TO  THE COVID-19 PANDEMIC.

## 2018-07-09 ENCOUNTER — Ambulatory Visit
Admission: RE | Admit: 2018-07-09 | Discharge: 2018-07-09 | Disposition: A | Discharge status: Home / Self Care | Payer: Medicare Other | Source: Ambulatory Visit | Attending: Nurse Practitioner | Admitting: Nurse Practitioner

## 2018-07-09 ENCOUNTER — Other Ambulatory Visit: Payer: Self-pay | Admitting: Nurse Practitioner

## 2018-07-09 ENCOUNTER — Telehealth: Payer: Self-pay

## 2018-07-09 DIAGNOSIS — W19XXXA Unspecified fall, initial encounter: Secondary | ICD-10-CM

## 2018-07-09 DIAGNOSIS — M6281 Muscle weakness (generalized): Secondary | ICD-10-CM

## 2018-07-09 DIAGNOSIS — R9089 Other abnormal findings on diagnostic imaging of central nervous system: Secondary | ICD-10-CM

## 2018-07-09 DIAGNOSIS — I639 Cerebral infarction, unspecified: Secondary | ICD-10-CM | POA: Insufficient documentation

## 2018-07-09 LAB — HEMOGLOBIN A1C
Est. average glucose Bld gHb Est-mCnc: 214 mg/dL
Hgb A1c MFr Bld: 9.1 % — ABNORMAL HIGH (ref 4.8–5.6)

## 2018-07-09 LAB — BMP8+EGFR
BUN/Creatinine Ratio: 30 — ABNORMAL HIGH (ref 12–28)
BUN: 25 mg/dL (ref 8–27)
CO2: 24 mmol/L (ref 20–29)
Calcium: 9.6 mg/dL (ref 8.7–10.3)
Chloride: 102 mmol/L (ref 96–106)
Creatinine, Ser: 0.83 mg/dL (ref 0.57–1.00)
GFR calc Af Amer: 84 mL/min/{1.73_m2} (ref >59)
GFR calc non Af Amer: 73 mL/min/{1.73_m2} (ref >59)
Glucose: 134 mg/dL — ABNORMAL HIGH (ref 65–99)
Potassium: 4.6 mmol/L (ref 3.5–5.2)
Sodium: 142 mmol/L (ref 134–144)

## 2018-07-09 LAB — LIPID PANEL
Chol/HDL Ratio: 4.5 ratio — ABNORMAL HIGH (ref 0.0–4.4)
Cholesterol, Total: 237 mg/dL — ABNORMAL HIGH (ref 100–199)
HDL: 53 mg/dL (ref >39)
LDL Calculated: 166 mg/dL — ABNORMAL HIGH (ref 0–99)
Triglycerides: 91 mg/dL (ref 0–149)
VLDL Cholesterol Cal: 18 mg/dL (ref 5–40)

## 2018-07-09 LAB — CK: Total CK: 135 U/L (ref 32–182)

## 2018-07-09 NOTE — Telephone Encounter (Signed)
Attempted to call patient twice no answer left voicemail.  I am calling from a private number and I had Donna [name] to call as well.  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

## 2018-07-09 NOTE — Telephone Encounter (Signed)
-----   Message from Arnette Felts, FNP sent at 07/09/2018  8:30 AM EDT ----- HgbA1c is down to 9.1 from 11.4 continue what you are doing.  I am proud of you.  Your muscle enzymes are better. Your blood sugar has also improved and dropped down to 134 from 330.  This is going in the right direction. Kidney functions are stable, however drink more water.  Total cholesterol is stable at 237, LDL is slightly up to 166, increase your fiber intake.

## 2018-07-09 NOTE — Telephone Encounter (Signed)
Called pt to notify her to go to [place] [facility] [address] location before 3pm today for a CT scan  [facility]

## 2018-07-09 NOTE — Telephone Encounter (Signed)
1st attempt to give lab results  

## 2018-07-10 ENCOUNTER — Other Ambulatory Visit: Payer: Self-pay

## 2018-07-10 ENCOUNTER — Other Ambulatory Visit: Payer: Self-pay | Admitting: Nurse Practitioner

## 2018-07-10 ENCOUNTER — Ambulatory Visit: Payer: Medicare Other | Admitting: Nurse Practitioner

## 2018-07-10 MED ORDER — CLOPIDOGREL BISULFATE 75 MG PO TABS: 75.0000 mg | ORAL_TABLET | Freq: Every day | ORAL | 2 refills | Status: DC

## 2018-07-10 NOTE — Telephone Encounter (Signed)
Spoke with patient this morning and made her aware of her CT scan results, verbalized understanding.  Added clopidrogel to her medications.  Also made a referral to neurology for further evaluation.  Also spoke with son Netha Napoleone to discuss results.

## 2018-07-15 ENCOUNTER — Ambulatory Visit (INDEPENDENT_AMBULATORY_CARE_PROVIDER_SITE_OTHER): Payer: Medicare Other | Admitting: Pharmacist

## 2018-07-15 DIAGNOSIS — E785 Hyperlipidemia, unspecified: Secondary | ICD-10-CM

## 2018-07-15 DIAGNOSIS — I10 Essential (primary) hypertension: Secondary | ICD-10-CM | POA: Diagnosis not present

## 2018-07-15 DIAGNOSIS — Z794 Long term (current) use of insulin: Secondary | ICD-10-CM

## 2018-07-15 DIAGNOSIS — E119 Type 2 diabetes mellitus without complications: Secondary | ICD-10-CM

## 2018-07-16 NOTE — Patient Instructions (Signed)
Visit Information  Goals Addressed            This Visit's Progress     Patient Stated   . I need help paying for my medications (pt-stated)       Current Barriers:  . Financial Barriers  Pharmacist Clinical Goal(s):  Marland Kitchen Over the next 30 days, patient will work with pharmacist to address needs related to patient assistance program Tula Nakayama)  Interventions: . Comprehensive medication review performed. . Collaboration with provider re: medication management . Collaboration with CPhT Lilla Shook to help with patient assistance-->re-mailed application for [employer] financial assistance.  Patient to complete. Provider portion sent to Arnette Felts, DNP  Patient Self Care Activities:  . Self administers medications as prescribed--patient has medications and continues to take as prescribed . Attends all scheduled provider appointments . Calls pharmacy for medication refills  Please see past updates related to this goal by clicking on the "Past Updates" button in the selected goal      . I need to control & monitor my blood sugar (pt-stated)       Current Barriers:  . Patient did not have BG meter-->recently purchased ReliON meter from [employer] (RX for BG meter was called in to [place] on 05/10/18, but they had no record-will address)  Pharmacist Clinical Goal(s):  Marland Kitchen Over the next 60 days, patient will work with CM team to address needs related to blood sugar control  Interventions: . Comprehensive medication review performed. . Advised patient to continue eating DM healthy foods.  She states she continues eating more non-white/yellow/starchy vegetables and can tell a difference.  Patient reported FBGs<130.  A1c decreased from 11.4-->9.1 (07/08/18). Encouraged her to keep up the great work! . Discussed plans with patient for ongoing care management follow up and provided patient with direct contact information for care management team  Patient Self Care Activities:  . Self  administers medications as prescribed . Attends all scheduled provider appointments . Calls pharmacy for medication refills  Please see past updates related to this goal by clicking on the "Past Updates" button in the selected goal         The patient verbalized understanding of instructions provided today and declined a print copy of patient instruction materials.   The CM team will reach out to the patient again over the next 21 days.   Kieth Brightly, PharmD, BCPS Clinical Pharmacist, [facility]  [facility]  Direct Dial: [phone]

## 2018-07-16 NOTE — Progress Notes (Signed)
  Chronic Care Management   Visit Note  07/15/2018 Name: ARITZEL GEERDES MRN: [id number] DOB: 05-13-1950  Referred by: Arnette Felts, FNP Reason for referral : Chronic Care Management   ESA CADD is a 68 y.o. year old female who is a primary care patient of Arnette Felts, FNP. The CCM team was consulted for assistance with chronic disease management and care coordination needs.   Review of patient status, including review of consultants reports, relevant laboratory and other test results, and collaboration with appropriate care team members and the patient's provider was performed as part of comprehensive patient evaluation and provision of chronic care management services.    I spoke with Ms. Kahler by telephone today.  Objective:   Goals Addressed            This Visit's Progress     Patient Stated   . I need help paying for my medications (pt-stated)       Current Barriers:  . Financial Barriers  Pharmacist Clinical Goal(s):  Marland Kitchen Over the next 30 days, patient will work with pharmacist to address needs related to patient assistance program Tula Nakayama)  Interventions: . Comprehensive medication review performed. . Collaboration with provider re: medication management . Collaboration with CPhT Lilla Shook to help with patient assistance-->re-mailed application for [employer] financial assistance.  Patient to complete. Provider portion sent to Arnette Felts, DNP  Patient Self Care Activities:  . Self administers medications as prescribed--patient has medications and continues to take as prescribed . Attends all scheduled provider appointments . Calls pharmacy for medication refills  Please see past updates related to this goal by clicking on the "Past Updates" button in the selected goal      . I need to control & monitor my blood sugar (pt-stated)       Current Barriers:  . Patient did not have BG meter-->recently purchased ReliON meter from [employer] (RX for BG meter was  called in to [place] on 05/10/18, but they had no record-will address)  Pharmacist Clinical Goal(s):  Marland Kitchen Over the next 60 days, patient will work with CM team to address needs related to blood sugar control  Interventions: . Comprehensive medication review performed. . Advised patient to continue eating DM healthy foods.  She states she continues eating more non-white/yellow/starchy vegetables and can tell a difference.  Patient reported FBGs<130.  A1c decreased from 11.4-->9.1 (07/08/18). Encouraged her to keep up the great work! . Discussed plans with patient for ongoing care management follow up and provided patient with direct contact information for care management team  Patient Self Care Activities:  . Self administers medications as prescribed . Attends all scheduled provider appointments . Calls pharmacy for medication refills  Please see past updates related to this goal by clicking on the "Past Updates" button in the selected goal       PLAN: -The CM team will reach out to the patient again over the next 3 weeks days.   -Will follow PAP process for Francina Ames, PharmD, BCPS Clinical Pharmacist, [facility]  [facility]  Direct Dial: [phone]

## 2018-07-17 ENCOUNTER — Other Ambulatory Visit: Payer: Self-pay | Admitting: Pharmacy Technician

## 2018-07-17 NOTE — Patient Outreach (Signed)
[facility]) Care Management  07/17/2018  Donna [name] September 05, 1950 [id number]                          Medication Assistance Referral  Referral From: [facility] RPh Kandra Nicolas (Clinic RPh)  Medication/Company: Tula Nakayama / [employer] Patient application portion:  Mining engineer portion: Faxed  to J. Moore, FNP-BC   Follow up:  Will collaborate follow up with Donna [name] in 3-5 business days to confirm application(s) have been received.  Suzan Slick Effie Shy CPhT Certified Pharmacy Technician [facility] Care Management Direct Dial:[phone]

## 2018-07-24 ENCOUNTER — Telehealth: Payer: Self-pay

## 2018-07-24 NOTE — Telephone Encounter (Signed)
I called pt to see why she declined an appointment with neurology she stated they tried to schedule her for a virtual visit and she is unaware of how to do that so she declined. I advised pt that they would have walked her through what she needed to do for her visit and she should really give them a call to reschedule it because she will not be released to go back to work until she does pt stated she is probably just going to retire. YRL,RMA

## 2018-08-02 ENCOUNTER — Ambulatory Visit: Payer: Self-pay

## 2018-08-02 NOTE — Progress Notes (Signed)
  Chronic Care Management   Outreach Note  08/02/2018 Name: Donna [name] MRN: [id number] DOB: August 26, 1950  Referred by: Arnette Felts, FNP Reason for referral : Chronic Care Management   An unsuccessful telephone outreach was attempted today. The patient was referred to the case management team by for assistance with chronic care management and care coordination.   Follow Up Plan: A HIPPA compliant phone message was left for the patient providing contact information and requesting a return call.   I will reach out within 5-7 business days unless call returned sooner.  Kieth Brightly, PharmD, BCPS Clinical Pharmacist, [facility]  [facility]  Direct Dial: [phone]

## 2018-08-08 ENCOUNTER — Ambulatory Visit: Payer: Self-pay | Admitting: Pharmacist

## 2018-08-08 DIAGNOSIS — E785 Hyperlipidemia, unspecified: Secondary | ICD-10-CM

## 2018-08-08 DIAGNOSIS — E119 Type 2 diabetes mellitus without complications: Secondary | ICD-10-CM

## 2018-08-08 DIAGNOSIS — Z794 Long term (current) use of insulin: Secondary | ICD-10-CM

## 2018-08-08 DIAGNOSIS — I10 Essential (primary) hypertension: Secondary | ICD-10-CM

## 2018-08-08 NOTE — Progress Notes (Signed)
  Chronic Care Management   Visit Note  08/08/2018 Name: Donna [name] MRN: [id number] DOB: 1951/02/22  Referred by: Arnette Felts, FNP Reason for referral : Chronic Care Management   Donna [name] is a 68 y.o. year old female who is a primary care patient of Arnette Felts, FNP. The CCM team was consulted for assistance with chronic disease management and care coordination needs.   Review of patient status, including review of consultants reports, relevant laboratory and other test results, and collaboration with appropriate care team members and the patient's provider was performed as part of comprehensive patient evaluation and provision of chronic care management services.    I spoke with Donna [name] by telephone today.  Objective:   Goals Addressed            This Visit's Progress     Patient Stated   . I need help paying for my medications (pt-stated)       Current Barriers:  . Financial Barriers  Pharmacist Clinical Goal(s):  Marland Kitchen Over the next 30 days, patient will work with pharmacist to address needs related to patient assistance program Tula Nakayama)  Interventions: . Comprehensive medication review performed. . Collaboration with provider re: medication management . Collaboration with CPhT Lilla Shook to help with patient assistance-->patient has not received 2nd mailed application for [facility] financial assistance.  Will attempt to send one more time.  Patient to complete. Provider portion sent to Arnette Felts, DNP, FNP  Patient Self Care Activities:  . Self administers medications as prescribed--patient has medications and continues to take as prescribed . Attends all scheduled provider appointments . Calls pharmacy for medication refills  Please see past updates related to this goal by clicking on the "Past Updates" button in the selected goal      . I need to control & monitor my blood sugar (pt-stated)       Current Barriers:  . Patient did not have BG  meter-->recently purchased ReliON meter from [employer] (RX for BG meter was called in to [place] on 05/10/18, but they had no record-will address)  Pharmacist Clinical Goal(s):  Marland Kitchen Over the next 60 days, patient will work with CM team to address needs related to blood sugar control  Interventions: . Comprehensive medication review performed. . Advised patient to continue eating DM healthy foods.  She states she continues eating more non-white/yellow/starchy vegetables and can tell a difference.  Patient reported FBGs<130, however she is not checking BGs daily.  She reports, "I get tired of sticking myself".  Her A1c has decreased from 11.4-->9.1 (07/08/18). Encouraged her to keep up the great work!  She is currently with her son in [place] and does not know when she will return. . Her next PCP appt is on 10/07/18 . Discussed plans with patient for ongoing care management follow up and provided patient with direct contact information for care management team  Patient Self Care Activities:  . Self administers medications as prescribed . Attends all scheduled provider appointments . Calls pharmacy for medication refills  Please see past updates related to this goal by clicking on the "Past Updates" button in the selected goal         Plan:  -The CM team will reach out to the patient again over the next 7-10 business  days.   Kieth Brightly, PharmD, BCPS Clinical Pharmacist, [facility]  [facility]  Direct Dial: [phone]

## 2018-08-08 NOTE — Patient Instructions (Signed)
Visit Information  Goals Addressed            This Visit's Progress     Patient Stated   . I need help paying for my medications (pt-stated)       Current Barriers:  . Financial Barriers  Pharmacist Clinical Goal(s):  Marland Kitchen Over the next 30 days, patient will work with pharmacist to address needs related to patient assistance program Tula Nakayama)  Interventions: . Comprehensive medication review performed. . Collaboration with provider re: medication management . Collaboration with CPhT Lilla Shook to help with patient assistance-->patient has not received 2nd mailed application for [facility] financial assistance.  Will attempt to send one more time.  Patient to complete. Provider portion sent to Arnette Felts, DNP, FNP  Patient Self Care Activities:  . Self administers medications as prescribed--patient has medications and continues to take as prescribed . Attends all scheduled provider appointments . Calls pharmacy for medication refills  Please see past updates related to this goal by clicking on the "Past Updates" button in the selected goal      . I need to control & monitor my blood sugar (pt-stated)       Current Barriers:  . Patient did not have BG meter-->recently purchased ReliON meter from [employer] (RX for BG meter was called in to [place] on 05/10/18, but they had no record-will address)  Pharmacist Clinical Goal(s):  Marland Kitchen Over the next 60 days, patient will work with CM team to address needs related to blood sugar control  Interventions: . Comprehensive medication review performed. . Advised patient to continue eating DM healthy foods.  She states she continues eating more non-white/yellow/starchy vegetables and can tell a difference.  Patient reported FBGs<130, however she is not checking BGs daily.  She reports, "I get tired of sticking myself".  Her A1c has decreased from 11.4-->9.1 (07/08/18). Encouraged her to keep up the great work!  She is currently with her son in  [place] and does not know when she will return. . Her next PCP appt is on 10/07/18 . Discussed plans with patient for ongoing care management follow up and provided patient with direct contact information for care management team  Patient Self Care Activities:  . Self administers medications as prescribed . Attends all scheduled provider appointments . Calls pharmacy for medication refills  Please see past updates related to this goal by clicking on the "Past Updates" button in the selected goal         The patient verbalized understanding of instructions provided today and declined a print copy of patient instruction materials.   The CM team will reach out to the patient again over the next 7-10 business days.  Kieth Brightly, PharmD, BCPS Clinical Pharmacist, [facility]  [facility]  Direct Dial: [phone]

## 2018-08-13 ENCOUNTER — Telehealth: Payer: Self-pay

## 2018-08-13 ENCOUNTER — Encounter: Payer: Self-pay | Admitting: Nurse Practitioner

## 2018-08-13 NOTE — Telephone Encounter (Signed)
Notified pt that we will fax over her forms for time off. YRL,RMA

## 2018-08-22 ENCOUNTER — Telehealth: Payer: Self-pay

## 2018-08-27 ENCOUNTER — Telehealth: Payer: Self-pay | Admitting: *Deleted

## 2018-08-27 NOTE — Telephone Encounter (Addendum)
I spoke with the pt re: 6/18 @ 1:30 pm appt. She stated she felt comfortable coming into the office given the virus. She understands the check-in process. Her sister will bring her. Pt will bring her own mask. She passed the COVID19 symptom screening and exposure questions today. She has traveled to [facility] within the last 14 days but denies any known exposure to anyone who has the virus or suspected of having it. Pt verbalized appreciation for the call.   COVID risk score 4.

## 2018-08-29 ENCOUNTER — Ambulatory Visit (INDEPENDENT_AMBULATORY_CARE_PROVIDER_SITE_OTHER): Payer: Medicare HMO | Admitting: Neurology

## 2018-08-29 ENCOUNTER — Other Ambulatory Visit: Payer: Self-pay

## 2018-08-29 ENCOUNTER — Encounter: Payer: Self-pay | Admitting: Neurology

## 2018-08-29 VITALS — BP 199/79 | HR 92 | Temp 97.8°F | Ht 61.0 in | Wt 167.0 lb

## 2018-08-29 DIAGNOSIS — Z9189 Other specified personal risk factors, not elsewhere classified: Secondary | ICD-10-CM

## 2018-08-29 DIAGNOSIS — Z9114 Patient's other noncompliance with medication regimen: Secondary | ICD-10-CM | POA: Diagnosis not present

## 2018-08-29 DIAGNOSIS — I672 Cerebral atherosclerosis: Secondary | ICD-10-CM | POA: Diagnosis not present

## 2018-08-29 DIAGNOSIS — I639 Cerebral infarction, unspecified: Secondary | ICD-10-CM

## 2018-08-29 NOTE — Patient Instructions (Addendum)
MRI of the brain Call Dr. Doren Custard and see if he wants a Cat Scan of your blood vessels or ultrasound of neck Echocardiogram - We will send you to Cardiology for Echo and Lipid Clinic to help with cholesterol   Stroke Prevention Some medical conditions and lifestyle choices can lead to a higher risk for a stroke. You can help to prevent a stroke by making nutrition, lifestyle, and other changes. What nutrition changes can be made?   Eat healthy foods. ? Choose foods that are high in fiber. These include:  Fresh fruits.  Fresh vegetables.  Whole grains. ? Eat at least 5 or more servings of fruits and vegetables each day. Try to fill half of your plate at each meal with fruits and vegetables. ? Choose lean protein foods. These include:  Lowfat (lean) cuts of meat.  Chicken without skin.  Fish.  Tofu.  Beans.  Nuts. ? Eat low-fat dairy products. ? Avoid foods that:  Are high in salt (sodium).  Have saturated fat.  Have trans fat.  Have cholesterol.  Are processed.  Are premade.  Follow eating guidelines as told by your doctor. These may include: ? Reducing how many calories you eat and drink each day. ? Limiting how much salt you eat or drink each day to 1,500 milligrams (mg). ? Using only healthy fats for cooking. These include:  Olive oil.  Canola oil.  Sunflower oil. ? Counting how many carbohydrates you eat and drink each day. What lifestyle changes can be made?  Try to stay at a healthy weight. Talk to your doctor about what a good weight is for you.  Get at least 30 minutes of moderate physical activity at least 5 days a week. This can include: ? Fast walking. ? Biking. ? Swimming.  Do not use any products that have nicotine or tobacco. This includes cigarettes and e-cigarettes. If you need help quitting, ask your doctor. Avoid being around tobacco smoke in general.  Limit how much alcohol you drink to no more than 1 drink a day for nonpregnant  women and 2 drinks a day for men. One drink equals 12 oz of beer, 5 oz of wine, or 1 oz of hard liquor.  Do not use drugs.  Avoid taking birth control pills. Talk to your doctor about the risks of taking birth control pills if: ? You are over 59 years old. ? You smoke. ? You get migraines. ? You have had a blood clot. What other changes can be made?  Manage your cholesterol. ? It is important to eat a healthy diet. ? If your cholesterol cannot be managed through your diet, you may also need to take medicines. Take medicines as told by your doctor.  Manage your diabetes. ? It is important to eat a healthy diet and to exercise regularly. ? If your blood sugar cannot be managed through diet and exercise, you may need to take medicines. Take medicines as told by your doctor.  Control your high blood pressure (hypertension). ? Try to keep your blood pressure below 130/80. This can help lower your risk of stroke. ? It is important to eat a healthy diet and to exercise regularly. ? If your blood pressure cannot be managed through diet and exercise, you may need to take medicines. Take medicines as told by your doctor. ? Ask your doctor if you should check your blood pressure at home. ? Have your blood pressure checked every year. Do this even if your blood  pressure is normal.  Talk to your doctor about getting checked for a sleep disorder. Signs of this can include: ? Snoring a lot. ? Feeling very tired.  Take over-the-counter and prescription medicines only as told by your doctor. These may include aspirin or blood thinners (antiplatelets or anticoagulants).  Make sure that any other medical conditions you have are managed. Where to find more information  American Stroke Association: www.strokeassociation.org  National Stroke Association: www.stroke.org Get help right away if:  You have any symptoms of stroke. "BE FAST" is an easy way to remember the main warning signs: ? B -  Balance. Signs are dizziness, sudden trouble walking, or loss of balance. ? E - Eyes. Signs are trouble seeing or a sudden change in how you see. ? F - Face. Signs are sudden weakness or loss of feeling of the face, or the face or eyelid drooping on one side. ? A - Arms. Signs are weakness or loss of feeling in an arm. This happens suddenly and usually on one side of the body. ? S - Speech. Signs are sudden trouble speaking, slurred speech, or trouble understanding what people say. ? T - Time. Time to call emergency services. Write down what time symptoms started.  You have other signs of stroke, such as: ? A sudden, very bad headache with no known cause. ? Feeling sick to your stomach (nausea). ? Throwing up (vomiting). ? Jerky movements you cannot control (seizure). These symptoms may represent a serious problem that is an emergency. Do not wait to see if the symptoms will go away. Get medical help right away. Call your local emergency services (911 in the [place] Do not drive yourself to the hospital. Summary  You can prevent a stroke by eating healthy, exercising, not smoking, drinking less alcohol, and treating other health problems, such as diabetes, high blood pressure, or high cholesterol.  Do not use any products that contain nicotine or tobacco, such as cigarettes and e-cigarettes.  Get help right away if you have any signs or symptoms of a stroke. This information is not intended to replace advice given to you by your health care provider. Make sure you discuss any questions you have with your health care provider. Document Released: 08/29/2011 Document Revised: 05/31/2016 Document Reviewed: 05/31/2016 Elsevier Interactive Patient Education  2019 [employer]

## 2018-08-29 NOTE — Progress Notes (Signed)
[facility]    Provider:  Dr Jaynee Eagles Requesting Provider: Minette Brine, FNP Primary Care Provider:  Minette Brine, FNP  CC:  Stroke  HPI:  Donna [name] is a 68 y.o. female here as requested by Minette Brine, [place] for fall, muscle weakness, abnormal CT brain. She has multiple atherosclerotic risk factors including uncontrolled hypertension with a history of noncompliance, uncontrolled diabetes mellitus and obesity. Patient says she did not go to the hospital after she had the CT of the head. She was at work and her left leg was hurting and she just fell. She didn't feel weak at the time. She denies sensory changes, facial droop, weakness. She says she does not feel weak today but she has different places where her muscles feel weak. She denies any focal weakness. She is a poor historian. But after the fall some people told her left face looked different. No palpitations except when she drinks caffeine. She is trying to eat better. She still does not take ASA 48m, she says she forgets. She takes her other meds daily. Denies significant snoring or daytime somnolence. She states she sleep, feels well rested.  Reviewed notes, labs and imaging from outside physicians, which showed:  CT head, reviewed and agree with the following:   Recent and likely acute infarct involving much of the right lentiform  nucleus as well as the adjacent external and extreme capsule regions. There is mild periventricular small vessel disease. No mass or hemorrhage.  Foci of arterial vascular calcification noted. Multifocal paranasal sinus disease noted, most severe in the left maxillary antrum. These results will be called to the ordering clinician or representative by the Radiologist Assistant, and communication documented in the PACS or zVision Dashboard.  04/04/2018: Chol 237, ldl 131, BUN 23, Cr 1, hgba1c 11.4, TSH nml, CK 387, B12 1324  06/2018: HgbA1c 9.1, CK 135, Chol 237, ldl 166 I reviewed  vascular surgery notes.  She had been followed by Dr. DDoren Custardfor extracranial carotid artery stenosis.  This note is from May 2019.  At that time she had one episode of difficulty speaking prior to her first visit on 12/17/2013.  She was supposed to be taking a statin, ACE inhibitor and p.o. insulin DM medication stating she forgot to take her 81 mg aspirin often.  Advised her to take 81 mg of aspirin every day.  Denies tobacco or EtOH use.  Noncompliant did not take her hypertension medication that day.  She has multiple atherosclerotic risk factors including uncontrolled hypertension with a history of noncompliance, uncontrolled diabetes mellitus and obesity.  Carotid duplex in May 2019 showed bilateral ICA 1 to 39% stenosis, left ECA greater than 50% stenosis, bilateral vertebral artery flow is antegrade, bilateral subclavian artery waveforms are normal no significant changes to previous exams in April 2016 and 2017.  I reviewed Donna [name]'s notes, in April 2020 patient was seen in the office due to a fall at work.  That was 1 week prior.  She lost her footing.  She complained of muscle weakness.  DDarcey Noranoticed Babinski sign on the right side.   Review of Systems: Patient complains of symptoms per HPI as well as the following symptoms stroke. Pertinent negatives and positives per HPI. All others negative.   Social History   Socioeconomic History  . Marital status: Legally Separated    Spouse name: Not on file  . Number of children: Not on file  . Years of education: Not on file  . Highest  education level: Not on file  Occupational History  . Not on file  Social Needs  . Financial resource strain: Not hard at all  . Food insecurity    Worry: Never true    Inability: Never true  . Transportation needs    Medical: No    Non-medical: No  Tobacco Use  . Smoking status: Never Smoker  . Smokeless tobacco: Never Used  Substance and Sexual Activity  . Alcohol use: No  . Drug use: No   . Sexual activity: Not Currently  Lifestyle  . Physical activity    Days per week: 0 days    Minutes per session: 0 min  . Stress: Not at all  Relationships  . Social Herbalist on phone: Not on file    Gets together: Not on file    Attends religious service: Not on file    Active member of club or organization: Not on file    Attends meetings of clubs or organizations: Not on file    Relationship status: Not on file  . Intimate partner violence    Fear of current or ex partner: No    Emotionally abused: No    Physically abused: No    Forced sexual activity: No  Other Topics Concern  . Not on file  Social History Narrative   Lives at home alone   Right handed    Family History  Problem Relation Age of Onset  . Stroke Mother   . Diabetes Father   . Diabetes Sister   . Breast cancer Neg Hx     Past Medical History:  Diagnosis Date  . Diabetes mellitus without complication ([place])   . Hyperlipidemia   . Hypertension     Patient Active Problem List   Diagnosis Date Noted  . Right-sided cerebrovascular accident (CVA) ([place]) 08/30/2018  . Non compliance w medication regimen 08/30/2018  . Atherosclerotic cerebrovascular disease 08/30/2018  . At high risk for stroke 08/30/2018  . Cerebral infarction ([place]) 07/09/2018  . Hyperlipidemia 07/08/2018  . Fall 07/08/2018  . Muscle weakness (generalized) 04/04/2018  . Fatigue 04/04/2018  . Diabetic retinopathy ([place]) 03/28/2018  . Type 2 diabetes mellitus ([place]) 12/09/2017  . Essential hypertension 12/09/2017  . Carotid artery disease ([place]) 12/17/2013    Past Surgical History:  Procedure Laterality Date  . MYOMECTOMY      Current Outpatient Medications  Medication Sig Dispense Refill  . amLODipine (NORVASC) 5 MG tablet Take 5 mg by mouth daily.    Marland Kitchen aspirin 325 MG tablet Take 325 mg by mouth daily.    . blood glucose meter kit and supplies KIT Dispense based on patient and insurance preference. Use up to four  times daily as directed. (FOR ICD-9 250.00, 250.01). 1 each 0  . CALCIUM PO Take by mouth.    . Insulin Glargine-Lixisenatide (SOLIQUA) 100-33 UNT-MCG/ML SOPN Inject 20 Units into the skin at bedtime.    . Multiple Vitamin (MULTIVITAMIN PO) Take by mouth.    . olmesartan-hydrochlorothiazide (BENICAR HCT) 40-25 MG tablet Take 1 tablet by mouth daily. 90 tablet 1  . OVER THE COUNTER MEDICATION Omega Plus    . VITAMIN D PO Take by mouth.     No current facility-administered medications for this visit.     Allergies as of 08/29/2018  . (No Known Allergies)    Vitals: BP (!) 199/79 (BP Location: Right Arm, Patient Position: Sitting) Comment: pt states SBP usually in the 180s  Pulse 92  Temp 97.8 F (36.6 C)   Ht 5' 1"  (1.549 m)   Wt 167 lb (75.8 kg)   BMI 31.55 kg/m  Last Weight:  Wt Readings from Last 1 Encounters:  08/29/18 167 lb (75.8 kg)   Last Height:   Ht Readings from Last 1 Encounters:  08/29/18 5' 1"  (1.549 m)     Physical exam: Exam: Gen: NAD, obese, poor historian  CV: RRR, no MRG. No Carotid Bruits. No peripheral edema, warm, nontender Eyes: Conjunctivae clear without exudates or hemorrhage  Neuro: Detailed Neurologic Exam  Speech:    Speech is normal; fluent and spontaneous with normal comprehension.  Cognition:    The patient is oriented to person, place, and time;     recent and remote memory intact;     language fluent;     Impaired attention, concentration,  fund of knowledge Cranial Nerves:    The pupils are equal, round, and reactive to light. Attempted, could not visualize fundi due to small pupils. Visual fields are full to finger confrontation. Extraocular movements are intact. Trigeminal sensation is intact and the muscles of mastication are normal. Minimal left lower facial droop. The palate elevates in the midline. Hearing intact. Voice is normal. Shoulder shrug is normal. The tongue has normal motion without fasciculations.   Coordination:     Normal finger to nose  Gait:    Heel-toe intact, tandem gait intact with only minimal imbalance.  Motor Observation:    No asymmetry, no atrophy, and no involuntary movements noted. Tone:    Normal muscle tone.    Posture:    Posture is normal or possible slight hyperlordosis maybe due to body habitus    Strength: Mild left prox arm and prox leg weakness 4+/5 deltoid and hip flexion.      Sensation: dec pinorick and temp distally in the feet in a gradient fashion. very good vibration and proprioception. Neg Romberg.      Reflex Exam:  DTR's:   Trace AJ and patellars, 2+ biceps..   Toes:    Left down, right up Clonus:    Clonus is absent.    Assessment/Plan:  68 year old with right-sided stroke seen on CT in April 2020 after a fall and c/o weakness. She has multiple atherosclerotic and stroke risk factors including uncontrolled hypertension with a history of noncompliance, uncontrolled diabetes mellitus, obesity and uncontrolled hyperlipidemia. Surprisingly her neuro exam is not bad, she has some minimal left-sided weakness and left facial droop, mild small-fiber neuropathy distally in the feet but her balance appears very good (neg Romberg, can heel/toe/tandem, normal proprioception and vibration). However she appears to be a poor historian.   Stroke appears bigger than I would expect for a lacunar infarct however it is in the area where we would lean more towards a lacunar infarct(and she has multiple vascular uncontrolled risk factors to cause lacunar-type ischemic infarcts). Still, she needs a thorough stroke evaluation and embolic workup if MRI confirms it looks embolic.   Was on ASA at the time of stroke but non compliant. PCP placed her on Plavix which she reports she is not taking. And she forgets her ASA. I have tried to impress the importance and asked her to continue ASA daily.   Cherlynn Kaiser MD in Cardiology has very kindly agreed to see patient, much appreciate our  colleagues in Cardiology. Patient had a stroke and needs echo, carotid dopplers and maybe other cardiac evaluations for possible embolic stroke(Based on MRI results). She also needs  lipid clinic, ldl 166 and she is intolerant of statins (elevated CK on zocor).   Needs close follow up with pcp to manage HTN, Diabetes, HLD and other vascular risk factors. Discussed compliance. She is at high risk for future strokes, discussed with patient.   MRI brain and MRA head Carotid Dopper and Echo - per Dr. Margaretann Loveless MD  May need follow up with Vascular Surgery, I have emailed Dr. Rosalia Hammers who saw her in the past May need 30-day heart monitor or TEE/Loop pending MRI/MRA brain results and cardiac eval. However patient appears to be poorly compliant which would be a problem if we needed anticoagulation. Recently she has reduced her HgbA1c from 11 to 9 which is significant, we will follow her and hope she continues this trend of increased compliance and improvement.  Orders Placed This Encounter  Procedures  . MR BRAIN WO CONTRAST  . MR ANGIO HEAD WO CONTRAST  . Ambulatory referral to Cardiology   I had a long d/w patient about her recent stroke, risk for recurrent stroke/TIAs, personally independently reviewed imaging studies and stroke evaluation results and answered questions.Continue ASAfor secondary stroke prevention and maintain strict control of hypertension with blood pressure goal below 130/90, diabetes with hemoglobin A1c goal below 6.5% and lipids with LDL cholesterol goal below 70 mg/dL.Patient has h/o statin intolerance hence recommend new PCSK9 inhibitor like Praluent and she will be seen in Cardiology and lipid clinic. I also advised the patient to eat a healthy diet with plenty of whole grains, cereals, fruits and vegetables, exercise regularly and maintain ideal body weight .Followup in the future with Janett Billow our stroke nurse. Stressed compliance with medications.   Cc: Minette Brine, [place],     Sarina Ill, MD  [facility] [address] [place], [place] [address]  Phone [phone] Fax [phone]

## 2018-08-30 ENCOUNTER — Telehealth: Payer: Self-pay | Admitting: Neurology

## 2018-08-30 DIAGNOSIS — Z9189 Other specified personal risk factors, not elsewhere classified: Secondary | ICD-10-CM | POA: Insufficient documentation

## 2018-08-30 DIAGNOSIS — Z9114 Patient's other noncompliance with medication regimen: Secondary | ICD-10-CM | POA: Insufficient documentation

## 2018-08-30 DIAGNOSIS — I672 Cerebral atherosclerosis: Secondary | ICD-10-CM | POA: Insufficient documentation

## 2018-08-30 DIAGNOSIS — I639 Cerebral infarction, unspecified: Secondary | ICD-10-CM | POA: Insufficient documentation

## 2018-08-30 NOTE — Telephone Encounter (Signed)
Donna [name], I spoke to Cherlynn Kaiser MD in Cardiology and will be referring patient there. Please let patient know cardiology will be calling her for an appointment. The reason for this is because of her stroke, she needs a cardiac eval and echocardiogram (discussed with patient at length at appointment). Also, she is intolerant of statins (elevated CK on zocor) and Dr. Margaretann Loveless can refer her to their lipid clinic who can prescribe Praluent or whatever they think can decrease her ldl from 166 to < 70 (which is where we want her after stroke). Thanks

## 2018-09-02 ENCOUNTER — Telehealth: Payer: Self-pay | Admitting: Nurse Practitioner

## 2018-09-02 NOTE — Telephone Encounter (Signed)
Called son to speak to him about the plan of care for the patient, no answer. Unable to leave a voicemail not set up

## 2018-09-02 NOTE — Telephone Encounter (Signed)
I called Donna [name] and LVM asking for call back.   When Donna [name] calls back, please advise her of Dr. Cathren Laine message below regarding cardiology.

## 2018-09-03 ENCOUNTER — Telehealth: Payer: Self-pay | Admitting: Neurology

## 2018-09-03 NOTE — Telephone Encounter (Signed)
Mcarthur Rossetti Josem Kaufmann: [id number] (exp. 09/03/18 to 10/03/18) order sent to GI. They will reach out to the patient to schedule.

## 2018-09-03 NOTE — Telephone Encounter (Signed)
Thank you I also reached out to the son but he did not answer to make sure she understands fully.

## 2018-09-03 NOTE — Telephone Encounter (Signed)
Spoke with pt and discussed Dr. Cathren Laine message. Pt understands that she is being referred to cardiology, Dr. Cherlynn Kaiser, and they will be calling her for an appt. I gave her their office number so she would know which one to lookout for. Her questions were answered. She understands why she is being referred to cardiology. She verbalized appreciation for the call.

## 2018-09-05 ENCOUNTER — Telehealth: Payer: Self-pay

## 2018-09-10 ENCOUNTER — Ambulatory Visit: Payer: Self-pay | Admitting: Pharmacist

## 2018-09-10 NOTE — Progress Notes (Signed)
  Chronic Care Management   Outreach Note  09/10/2018 Name: Donna [name] MRN: [id number] DOB: [birth year]  Referred by: Minette Brine, FNP Reason for referral : Chronic Care Management   An unsuccessful telephone outreach was attempted today. The patient was referred to the case management team by for assistance with chronic care management and care coordination.   Follow Up Plan: A HIPPA compliant phone message was left for the patient providing contact information and requesting a return call.  The care management team will reach out to the patient again over the next 5-7 business days.  Regina Eck, PharmD, BCPS Clinical Pharmacist, [facility] [place]: [phone]

## 2018-09-11 ENCOUNTER — Telehealth: Payer: Self-pay | Admitting: *Deleted

## 2018-09-11 NOTE — Telephone Encounter (Signed)
-----   Message from Elouise Munroe, MD sent at 09/02/2018  1:40 PM EDT ----- Regarding: FW: Patient needs cardiac workup and lipid clinic Can we please set this new patient up on my schedule, preferably on one of my DOD days? In office or virtual is fine, per patient preference. Thanks, GA ----- Message ----- From: Melvenia Beam, MD Sent: 08/30/2018   2:19 PM EDT To: Elouise Munroe, MD Subject: RE: Patient needs cardiac workup and lipid c#  Dr. Margaretann Loveless, Thank you SO much!! I will place referral for patient and have our office call. Thank you again !! Can't tell you how much I appreciate this! Vivien Rota ----- Message ----- From: Elouise Munroe, MD Sent: 08/29/2018   4:17 PM EDT To: Melvenia Beam, MD Subject: RE: Patient needs cardiac workup and lipid c#  Hi Dr. 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 I would be happy to see the patient if that's ok with you, and we can definitely get lipid clinic and any further imaging arranged as needed.   Please let me know if I can do anything to facilitate her referral.  Thanks, Cherlynn Kaiser MD ----- Message ----- From: Melvenia Beam, MD Sent: 08/29/2018   2:38 PM EDT To: Skeet Latch, MD Subject: Patient needs cardiac workup and lipid clinic  Hi Dr. Oval Linsey, would you mind if I sent this patient to you? She recently had a stroke, appears to be too big for just a lacunar stroke so I am going to work her up for embolic causes. She needs an echocardiogram. But she also has elevated cholesterol (LDL 166) and can't take a statin (elevated CK on Zocor) so she may need the lipid clinic and Praluent or something along those lines. She may need further TEE or loop if the MRI confirms this may be embolic. I don't think I can refer directly to the lipid clinic, would you mind seeing patient for all of this? Thank you SO much!! Georgia Dom MD

## 2018-09-11 NOTE — Telephone Encounter (Signed)
left message to call back - need to set up new patient appt with Dr Margaretann Loveless   - preferable July 10 , 2020 - (40 min slot).

## 2018-09-17 ENCOUNTER — Telehealth: Payer: Self-pay | Admitting: Pharmacist

## 2018-09-17 NOTE — Telephone Encounter (Signed)
SCHEDULER SPOKE TO PATIENT , APPOINTMENT ARRANGED . FOR July 10 ,2020

## 2018-09-19 ENCOUNTER — Telehealth: Payer: Self-pay

## 2018-09-19 NOTE — Telephone Encounter (Signed)
Left message on voicemail to see if patient was interested in moving AWV to 7/21. Encouraged to call back if interested.

## 2018-09-20 ENCOUNTER — Ambulatory Visit (INDEPENDENT_AMBULATORY_CARE_PROVIDER_SITE_OTHER): Payer: Medicare HMO | Admitting: Internal Medicine

## 2018-09-20 ENCOUNTER — Other Ambulatory Visit: Payer: Self-pay

## 2018-09-20 VITALS — BP 179/88 | HR 96 | Temp 97.9°F | Ht 61.0 in | Wt 165.6 lb

## 2018-09-20 DIAGNOSIS — R0683 Snoring: Secondary | ICD-10-CM

## 2018-09-20 DIAGNOSIS — E785 Hyperlipidemia, unspecified: Secondary | ICD-10-CM

## 2018-09-20 DIAGNOSIS — Z794 Long term (current) use of insulin: Secondary | ICD-10-CM | POA: Diagnosis not present

## 2018-09-20 DIAGNOSIS — I639 Cerebral infarction, unspecified: Secondary | ICD-10-CM

## 2018-09-20 DIAGNOSIS — W19XXXS Unspecified fall, sequela: Secondary | ICD-10-CM

## 2018-09-20 DIAGNOSIS — I10 Essential (primary) hypertension: Secondary | ICD-10-CM

## 2018-09-20 DIAGNOSIS — E119 Type 2 diabetes mellitus without complications: Secondary | ICD-10-CM

## 2018-09-20 MED ORDER — CARVEDILOL 6.25 MG PO TABS: 6.2500 mg | ORAL_TABLET | Freq: Two times a day (BID) | ORAL | 3 refills | Status: DC

## 2018-09-20 NOTE — Progress Notes (Signed)
Cardiology Office Note:    Date:  09/20/2018   ID:  Donna ChampionGarnette S [name], DOB 1950/07/14, MRN [id number]  PCP:  Donna [name], [name], FNP  Cardiologist:  No primary care provider on file.  Electrophysiologist:  None   Referring MD: Donna [name], Antonia B, MD   Chief Complaint: cardiovascular risk reduction in setting of stroke  History of Present Illness:    Donna [name] is a 68 y.o. female with a hx of uncontrolled HTN, DM2 and and obesity who presents today at the referral of Dr. Lucia [name] in neurology for cardiovascular workup after stroke and cardiovascular risk reduction discussion. Please see Dr. Trevor [name]'s note from   She is largely asymptomatic from a cardiopulmonary standpoint, and denies cp or sob. She has started a walking program and has no significant issues. Occasionally she will have SOB while climbing stairs. She endorses palpitations once a month. Denies dizziness, lightheadedness, or lifetime syncope.   Her stroke occurred at work, where she experienced a fall without loss of consciousness.  No fhx of MI, mother had a stroke  Endorses snoring and has not been evaluated for sleep apnea.  Past Medical History:  Diagnosis Date  . Diabetes mellitus without complication (HCC)   . Hyperlipidemia   . Hypertension     Past Surgical History:  Procedure Laterality Date  . MYOMECTOMY      Current Medications: No outpatient medications have been marked as taking for the 09/20/18 encounter (Office Visit) with Parke PoissonAcharya, Tolulope Pinkett A, MD.     Allergies:   Patient has no known allergies.   Social History   Socioeconomic History  . Marital status: Legally Separated    Spouse name: Not on file  . Number of children: Not on file  . Years of education: Not on file  . Highest education level: Not on file  Occupational History  . Not on file  Social Needs  . Financial resource strain: Not hard at all  . Food insecurity    Worry: Never true    Inability: Never true  . Transportation needs     Medical: No    Non-medical: No  Tobacco Use  . Smoking status: Never Smoker  . Smokeless tobacco: Never Used  Substance and Sexual Activity  . Alcohol use: No  . Drug use: No  . Sexual activity: Not Currently  Lifestyle  . Physical activity    Days per week: 0 days    Minutes per session: 0 min  . Stress: Not at all  Relationships  . Social Musicianconnections    Talks on phone: Not on file    Gets together: Not on file    Attends religious service: Not on file    Active member of club or organization: Not on file    Attends meetings of clubs or organizations: Not on file    Relationship status: Not on file  Other Topics Concern  . Not on file  Social History Narrative   Lives at home alone   Right handed     Family History: The patient's family history includes Diabetes in her father and sister; Stroke in her mother. There is no history of Breast cancer.  ROS:   Please see the history of present illness.    All other systems reviewed and are negative.  EKGs/Labs/Other Studies Reviewed:    The following studies were reviewed today:  EKG:  NSR, LAE, TW abnormality I, avL  Recent Labs: 04/04/2018: ALT 28; TSH 0.568 07/08/2018: BUN 25; Creatinine, Ser 0.83;  Potassium 4.6; Sodium 142  Recent Lipid Panel    Component Value Date/Time   CHOL 237 (H) 07/08/2018 1417   TRIG 91 07/08/2018 1417   HDL 53 07/08/2018 1417   CHOLHDL 4.5 (H) 07/08/2018 1417   LDLCALC 166 (H) 07/08/2018 1417    Physical Exam:    VS:  BP (!) 179/88   Pulse 96   Temp 97.9 F (36.6 C)   Ht 5\' 1"  (1.549 m)   Wt 165 lb 9.6 oz (75.1 kg)   SpO2 95%   BMI 31.29 kg/m     Wt Readings from Last 5 Encounters:  09/20/18 165 lb 9.6 oz (75.1 kg)  08/29/18 167 lb (75.8 kg)  07/08/18 168 lb 6.4 oz (76.4 kg)  05/06/18 173 lb 12.8 oz (78.8 kg)  04/04/18 173 lb 12.8 oz (78.8 kg)     Constitutional: No acute distress Eyes: sclera non-icteric, normal conjunctiva and lids ENMT: normal dentition, moist  mucous membranes Cardiovascular: regular rhythm, normal rate, no murmurs. S1 and S2 normal. Radial pulses normal bilaterally. No jugular venous distention.  Respiratory: clear to auscultation bilaterally GI : normal bowel sounds, soft and nontender. No distention.   MSK: extremities warm, well perfused. No edema.  NEURO:  moves all extremities, no apparent focal weakness PSYCH: alert and oriented x 3, normal mood and affect.  ASSESSMENT:    1. Stroke of unknown cause ([place])   2. Snoring   3. Essential hypertension   4. Hyperlipidemia, unspecified hyperlipidemia type   5. Fall, sequela   6. Type 2 diabetes mellitus without complication, with long-term current use of insulin (HCC)    PLAN:    Stroke - referral from neurology for further cardiovascular workup in the setting of stroke of yet unknown etiology. Will perform echocardiogram to evaluate for overall cardiovascular function and any contributors to stroke. Will perform carotid dopplers  Palpitations - will start carvedilol 6.25 mg BID for hypertension and palpitations. Will obtain 30 day event monitor to afib to work up causes of stroke.  Statin intolerance - will arrange for lipid clinic for discussion of pcsk9 inhibitor for treatment of hyperlipidemia and cardiovascular risk reduction.   HTN - will initiate carvedilol 6.25 mg daily, continue benicar and amlodipine, BP is suboptimally controlled at this time.   Snoring - will request a sleep study for snoring.   Cherlynn Kaiser, MD [place]  [facility]   Medication Adjustments/Labs and Tests Ordered: Current medicines are reviewed at length with the patient today.  Concerns regarding medicines are outlined above.  Orders Placed This Encounter  Procedures  . CARDIAC EVENT MONITOR  . EKG 12-Lead  . ECHOCARDIOGRAM COMPLETE  . Nocturnal polysomnography   Meds ordered this encounter  Medications  . carvedilol (COREG) 6.25 MG tablet    Sig: Take 1 tablet (6.25 mg  total) by mouth 2 (two) times daily.    Dispense:  180 tablet    Refill:  3    Patient Instructions  Medication Instructions:  START  CARVEDILOL 6.25 MG ONE TABLET TWICE A DAY    If you need a refill on your cardiac medications before your next appointment, please call your pharmacy.   Lab work:  NO LABS NEEDED    Testing/Procedures: WILL SCHEDULE  AT [place] [address] 1) Your physician has requested that you have an echocardiogram. Echocardiography is a painless test that uses sound waves to create images of your heart. It provides your doctor with information about the size  and shape of your heart and how well your heart's chambers and valves are working. This procedure takes approximately one hour. There are no restrictions for this procedure.  2)Your physician has recommended that you wear an event monitor 30 day . Event monitors are medical devices that record the heart's electrical activity. Doctors most often us these monitors to diagnose arrhythmias. Arrhythmias are problems with the speed or rhythm of the heartbeat. The monitor is a small, portable device. You can wear one while you do your normal daily activities. This is usually used to diagnose what is causing palpitations/syncope (passing out). They  May mail you the monitor  Will be schedule at [address] Your physician has requested that you have a carotid duplex. This test is an ultrasound of the carotid arteries in your neck. It looks at blood flow through these arteries that supply the brain with blood. Allow one hour for this exam. There are no restrictions or special instructions.   [facility] AT [facility] Your physician has recommended that you have a sleep study. This test records several body functions during sleep, including: brain activity, eye movement, oxygen and carbon dioxide blood levels, heart rate and rhythm, breathing rate and rhythm, the flow of air  through your mouth and nose, snoring, body muscle movements, and chest and belly movement   Follow-Up: At [facility], you and your health needs are our priority.  As part of our continuing mission to provide you with exceptional heart care, we have created designated Provider Care Teams.  These Care Teams include your primary Cardiologist (physician) and Advanced Practice Providers (APPs -  Physician Assistants and Nurse Practitioners) who all work together to provide you with the care you need, when you need it. . You will need a follow up appointment in  2   Months SEPT 16,2020 AT 3:20.  Please call our office 2 months in advance to schedule this appointment.  You may see Jodelle RedBRIDGETTE CHRISTOPHER, MDor one of the following Advanced Practice Providers on your designated Care Team:   . Theodore DemarkRhonda Barrett, PA-C . Joni ReiningKathryn Lawrence, DNP, ANP  Any Other Special Instructions Will Be Listed Below (If Applicable).  WILL BE CONTACTED BY [facility] ( HERE AT [address]- DISCUSS CHOLESTEROL OPTIONS

## 2018-09-20 NOTE — Patient Instructions (Addendum)
Medication Instructions:  START  CARVEDILOL 6.25 MG ONE TABLET TWICE A DAY    If you need a refill on your cardiac medications before your next appointment, please call your pharmacy.   Lab work:  NO LABS NEEDED    Testing/Procedures: WILL SCHEDULE  AT [place] 300 1) Your physician has requested that you have an echocardiogram. Echocardiography is a painless test that uses sound waves to create images of your heart. It provides your doctor with information about the size and shape of your heart and how well your heart's chambers and valves are working. This procedure takes approximately one hour. There are no restrictions for this procedure.  2)Your physician has recommended that you wear an event monitor 30 day . Event monitors are medical devices that record the heart's electrical activity. Doctors most often [place] these monitors to diagnose arrhythmias. Arrhythmias are problems with the speed or rhythm of the heartbeat. The monitor is a small, portable device. You can wear one while you do your normal daily activities. This is usually used to diagnose what is causing palpitations/syncope (passing out). They  May mail you the monitor  Will be schedule at [place] has requested that you have a carotid duplex. This test is an ultrasound of the carotid arteries in your neck. It looks at blood flow through these arteries that supply the brain with blood. Allow one hour for this exam. There are no restrictions or special instructions.   WILL BE SCHEDULE AT [facility] Your physician has recommended that you have a sleep study. This test records several body functions during sleep, including: brain activity, eye movement, oxygen and carbon dioxide blood levels, heart rate and rhythm, breathing rate and rhythm, the flow of air through your mouth and nose, snoring, body muscle movements, and chest and belly  movement   Follow-Up: At [facility], you and your health needs are our priority.  As part of our continuing mission to provide you with exceptional heart care, we have created designated Provider Care Teams.  These Care Teams include your primary Cardiologist (physician) and Advanced Practice Providers (APPs -  Physician Assistants and Nurse Practitioners) who all work together to provide you with the care you need, when you need it. . You will need a follow up appointment in  2   Months SEPT 16,2020 AT 3:20.  Please call our office 2 months in advance to schedule this appointment.  You may see Buford Dresser, MDor one of the following Advanced Practice Providers on your designated Care Team:   . Rosaria Ferries, PA-C . Jory Sims, DNP, ANP  Any Other Special Instructions Will Be Listed Below (If Applicable).  WILL BE CONTACTED BY [facility] ( HERE AT [place]

## 2018-09-23 ENCOUNTER — Telehealth: Payer: Self-pay | Admitting: *Deleted

## 2018-09-23 NOTE — Telephone Encounter (Signed)
Left message for patient to call and schedule echocardiogram, carotid doppler and visit with Pharm D for lipid consult ordered by Dr. Margaretann Loveless

## 2018-09-25 NOTE — Telephone Encounter (Signed)
Left message for patient to call and schedule Lipid consult with the Pharm D as ordered by Dr Margaretann Loveless

## 2018-10-01 ENCOUNTER — Encounter: Payer: Self-pay | Admitting: Internal Medicine

## 2018-10-01 NOTE — Telephone Encounter (Signed)
Left message for patient to call and schedule LIPID consult with Pharm D ordered by Dr. Margaretann Loveless

## 2018-10-03 ENCOUNTER — Ambulatory Visit (HOSPITAL_COMMUNITY)
Admission: RE | Admit: 2018-10-03 | Payer: Medicare HMO | Source: Ambulatory Visit | Attending: Internal Medicine | Admitting: Internal Medicine

## 2018-10-03 ENCOUNTER — Other Ambulatory Visit (HOSPITAL_COMMUNITY): Payer: Self-pay | Admitting: Internal Medicine

## 2018-10-03 ENCOUNTER — Ambulatory Visit (HOSPITAL_COMMUNITY)
Admission: RE | Admit: 2018-10-03 | Discharge: 2018-10-03 | Disposition: A | Discharge status: Home / Self Care | Payer: Medicare HMO | Source: Ambulatory Visit | Attending: Internal Medicine | Admitting: Internal Medicine

## 2018-10-03 ENCOUNTER — Other Ambulatory Visit: Payer: Self-pay

## 2018-10-03 DIAGNOSIS — I10 Essential (primary) hypertension: Secondary | ICD-10-CM | POA: Diagnosis not present

## 2018-10-03 DIAGNOSIS — I639 Cerebral infarction, unspecified: Secondary | ICD-10-CM | POA: Diagnosis not present

## 2018-10-03 DIAGNOSIS — I6523 Occlusion and stenosis of bilateral carotid arteries: Secondary | ICD-10-CM

## 2018-10-03 DIAGNOSIS — R0683 Snoring: Secondary | ICD-10-CM | POA: Diagnosis not present

## 2018-10-03 NOTE — Progress Notes (Signed)
Made any corrections needed, and agree with history, physical, neuro exam,assessment and plan as stated.     Cristofher Livecchi, MD [facility]  

## 2018-10-04 ENCOUNTER — Telehealth: Payer: Self-pay | Admitting: *Deleted

## 2018-10-04 NOTE — Telephone Encounter (Signed)
PA submitted to [employer] for sleep study. 

## 2018-10-04 NOTE — Telephone Encounter (Signed)
-----   Message from Raiford Simmonds, RN sent at 09/20/2018  5:33 PM EDT ----- Regarding: NEED SLEEP STUDY  PATIENT  HAD AN APPT WITH DR Margaretann Loveless ON 7/10  DX STROKE , SNORING  ORDERED NOCTURNAL POLY. STUDY  PATIENT AWARE IT MAY TAKE A COUPLE MONTHS  THANKS  SHARON

## 2018-10-07 ENCOUNTER — Encounter: Payer: Self-pay | Admitting: Nurse Practitioner

## 2018-10-07 ENCOUNTER — Ambulatory Visit (INDEPENDENT_AMBULATORY_CARE_PROVIDER_SITE_OTHER): Payer: Medicare HMO | Admitting: Nurse Practitioner

## 2018-10-07 ENCOUNTER — Other Ambulatory Visit: Payer: Self-pay

## 2018-10-07 VITALS — BP 170/82 | HR 62 | Temp 97.9°F | Ht 60.6 in | Wt 164.8 lb

## 2018-10-07 DIAGNOSIS — Z794 Long term (current) use of insulin: Secondary | ICD-10-CM | POA: Diagnosis not present

## 2018-10-07 DIAGNOSIS — E119 Type 2 diabetes mellitus without complications: Secondary | ICD-10-CM | POA: Diagnosis not present

## 2018-10-07 DIAGNOSIS — Z8673 Personal history of transient ischemic attack (TIA), and cerebral infarction without residual deficits: Secondary | ICD-10-CM

## 2018-10-07 DIAGNOSIS — E785 Hyperlipidemia, unspecified: Secondary | ICD-10-CM | POA: Diagnosis not present

## 2018-10-07 DIAGNOSIS — I10 Essential (primary) hypertension: Secondary | ICD-10-CM

## 2018-10-07 DIAGNOSIS — I639 Cerebral infarction, unspecified: Secondary | ICD-10-CM

## 2018-10-07 NOTE — Progress Notes (Signed)
Subjective:     Patient ID: Donna [name] , female    DOB: [birth year] , 68 y.o.   MRN: [id number]   Chief Complaint  Patient presents with  . Diabetes    HPI  Diabetes She presents for her follow-up diabetic visit. She has type 2 diabetes mellitus. Her disease course has been stable. There are no hypoglycemic associated symptoms. Pertinent negatives for hypoglycemia include no dizziness or headaches. There are no diabetic associated symptoms. Pertinent negatives for diabetes include no blurred vision, no chest pain, no polydipsia, no polyphagia and no polyuria. Symptoms are stable.     Past Medical History:  Diagnosis Date  . Diabetes mellitus without complication ([place])   . Hyperlipidemia   . Hypertension      Family History  Problem Relation Age of Onset  . Stroke Mother   . Diabetes Father   . Diabetes Sister   . Breast cancer Neg Hx      Current Outpatient Medications:  .  amLODipine (NORVASC) 5 MG tablet, Take 5 mg by mouth daily., Disp: , Rfl:  .  aspirin 325 MG tablet, Take 325 mg by mouth daily., Disp: , Rfl:  .  CALCIUM PO, Take by mouth., Disp: , Rfl:  .  carvedilol (COREG) 6.25 MG tablet, Take 1 tablet (6.25 mg total) by mouth 2 (two) times daily., Disp: 180 tablet, Rfl: 3 .  Insulin Glargine-Lixisenatide (SOLIQUA) 100-33 UNT-MCG/ML SOPN, Inject 20 Units into the skin at bedtime., Disp: , Rfl:  .  Multiple Vitamin (MULTIVITAMIN PO), Take by mouth., Disp: , Rfl:  .  olmesartan-hydrochlorothiazide (BENICAR HCT) 40-25 MG tablet, Take 1 tablet by mouth daily., Disp: 90 tablet, Rfl: 1 .  OVER THE COUNTER MEDICATION, Omega Plus, Disp: , Rfl:  .  VITAMIN D PO, Take by mouth., Disp: , Rfl:  .  blood glucose meter kit and supplies KIT, Dispense based on patient and insurance preference. Use up to four times daily as directed. (FOR ICD-9 250.00, 250.01). (Patient not taking: Reported on 10/07/2018), Disp: 1 each, Rfl: 0   No Known Allergies   Review of Systems   Constitutional: Negative.   Eyes: Negative for blurred vision.  Respiratory: Negative.   Cardiovascular: Negative for chest pain, palpitations and leg swelling.  Endocrine: Negative for polydipsia, polyphagia and polyuria.  Neurological: Negative for dizziness and headaches.     Today's Vitals   10/07/18 1203  BP: (!) 170/82  Pulse: 62  Temp: 97.9 F (36.6 C)  TempSrc: Oral  Weight: 164 lb 12.8 oz (74.8 kg)  Height: 5' 0.6" (1.539 m)  PainSc: 4   PainLoc: Knee   Body mass index is 31.55 kg/m.   Objective:  Physical Exam Constitutional:      General: She is not in acute distress.    Appearance: Normal appearance.  Eyes:     Extraocular Movements: Extraocular movements intact.  Cardiovascular:     Rate and Rhythm: Normal rate and regular rhythm.     Pulses: Normal pulses.     Heart sounds: Normal heart sounds. No murmur.  Pulmonary:     Effort: Pulmonary effort is normal. No respiratory distress.     Breath sounds: Normal breath sounds.  Skin:    General: Skin is warm and dry.     Capillary Refill: Capillary refill takes less than 2 seconds.  Neurological:     General: No focal deficit present.     Mental Status: She is alert and oriented to person, place, and time.  Cranial Nerves: No cranial nerve deficit.     Motor: No weakness.     Gait: Gait normal.     Deep Tendon Reflexes: Babinski sign present on the right side. Babinski sign absent on the left side.     Reflex Scores:      Patellar reflexes are 1+ on the right side and 1+ on the left side.      Achilles reflexes are 1+ on the right side and 0 on the left side. Psychiatric:        Mood and Affect: Mood normal.        Behavior: Behavior normal.        Thought Content: Thought content normal.        Judgment: Judgment normal.         Assessment And Plan:     1. Right-sided cerebrovascular accident (CVA) ([place]) Return to work on January 12, 2019. June 30, 2018 last day her initial return date in  May She is being followed by [facility] and has been scheduled for a sleep study   2. Type 2 diabetes mellitus without complication, with long-term current use of insulin (HCC)  Poorly controlled  I am concerned that she is actually taking her medications daily - Hemoglobin A1c - CMP14 + Anion Gap  3. Hyperlipidemia, unspecified hyperlipidemia type  Chronic, controlled  She is going to the lipid specialist and may be started on a PSK due to her elevated CK enzymes  - Hemoglobin A1c - Lipid panel  4. Essential hypertension Blood pressure is elevated today she reports she has been eating more foods with salt Increase her water intake and be sure to take her medications as directed    Minette Brine, FNP    THE PATIENT IS ENCOURAGED TO PRACTICE SOCIAL DISTANCING DUE TO THE COVID-19 PANDEMIC.

## 2018-10-08 ENCOUNTER — Ambulatory Visit (HOSPITAL_COMMUNITY): Payer: Medicare HMO | Attending: Internal Medicine

## 2018-10-08 DIAGNOSIS — I10 Essential (primary) hypertension: Secondary | ICD-10-CM | POA: Insufficient documentation

## 2018-10-08 DIAGNOSIS — R0683 Snoring: Secondary | ICD-10-CM | POA: Insufficient documentation

## 2018-10-08 DIAGNOSIS — I639 Cerebral infarction, unspecified: Secondary | ICD-10-CM

## 2018-10-08 LAB — CMP14 + ANION GAP
ALT: 20 IU/L (ref 0–32)
AST: 17 IU/L (ref 0–40)
Albumin/Globulin Ratio: 1.3 (ref 1.2–2.2)
Albumin: 4.1 g/dL (ref 3.8–4.8)
Alkaline Phosphatase: 73 IU/L (ref 39–117)
Anion Gap: 15 mmol/L (ref 10.0–18.0)
BUN/Creatinine Ratio: 20 (ref 12–28)
BUN: 20 mg/dL (ref 8–27)
Bilirubin Total: 0.3 mg/dL (ref 0.0–1.2)
CO2: 27 mmol/L (ref 20–29)
Calcium: 10.5 mg/dL — ABNORMAL HIGH (ref 8.7–10.3)
Chloride: 94 mmol/L — ABNORMAL LOW (ref 96–106)
Creatinine, Ser: 1 mg/dL (ref 0.57–1.00)
GFR calc Af Amer: 67 mL/min/{1.73_m2} (ref >59)
GFR calc non Af Amer: 58 mL/min/{1.73_m2} — ABNORMAL LOW (ref >59)
Globulin, Total: 3.2 g/dL (ref 1.5–4.5)
Glucose: 310 mg/dL — ABNORMAL HIGH (ref 65–99)
Potassium: 4.5 mmol/L (ref 3.5–5.2)
Sodium: 136 mmol/L (ref 134–144)
Total Protein: 7.3 g/dL (ref 6.0–8.5)

## 2018-10-08 LAB — LIPID PANEL
Chol/HDL Ratio: 4.5 ratio — ABNORMAL HIGH (ref 0.0–4.4)
Cholesterol, Total: 271 mg/dL — ABNORMAL HIGH (ref 100–199)
HDL: 60 mg/dL (ref >39)
LDL Calculated: 171 mg/dL — ABNORMAL HIGH (ref 0–99)
Triglycerides: 201 mg/dL — ABNORMAL HIGH (ref 0–149)
VLDL Cholesterol Cal: 40 mg/dL (ref 5–40)

## 2018-10-08 LAB — HEMOGLOBIN A1C
Est. average glucose Bld gHb Est-mCnc: 306 mg/dL
Hgb A1c MFr Bld: 12.3 % — ABNORMAL HIGH (ref 4.8–5.6)

## 2018-10-09 ENCOUNTER — Other Ambulatory Visit: Payer: Self-pay

## 2018-10-09 ENCOUNTER — Ambulatory Visit
Admission: RE | Admit: 2018-10-09 | Discharge: 2018-10-09 | Disposition: A | Discharge status: Home / Self Care | Payer: Medicare HMO | Source: Ambulatory Visit | Attending: Neurology | Admitting: Neurology

## 2018-10-09 ENCOUNTER — Other Ambulatory Visit: Payer: Self-pay | Admitting: Nurse Practitioner

## 2018-10-09 DIAGNOSIS — I639 Cerebral infarction, unspecified: Secondary | ICD-10-CM | POA: Diagnosis not present

## 2018-10-14 ENCOUNTER — Other Ambulatory Visit: Payer: Self-pay | Admitting: Nurse Practitioner

## 2018-10-16 ENCOUNTER — Ambulatory Visit: Payer: Self-pay

## 2018-10-16 ENCOUNTER — Telehealth: Payer: Self-pay | Admitting: Neurology

## 2018-10-16 ENCOUNTER — Telehealth: Payer: Medicare HMO

## 2018-10-16 ENCOUNTER — Other Ambulatory Visit (HOSPITAL_COMMUNITY): Payer: Self-pay | Admitting: Interventional Radiology

## 2018-10-16 ENCOUNTER — Other Ambulatory Visit: Payer: Self-pay | Admitting: Internal Medicine

## 2018-10-16 ENCOUNTER — Telehealth: Payer: Self-pay | Admitting: *Deleted

## 2018-10-16 DIAGNOSIS — E119 Type 2 diabetes mellitus without complications: Secondary | ICD-10-CM

## 2018-10-16 DIAGNOSIS — I10 Essential (primary) hypertension: Secondary | ICD-10-CM

## 2018-10-16 DIAGNOSIS — R0683 Snoring: Secondary | ICD-10-CM

## 2018-10-16 DIAGNOSIS — E785 Hyperlipidemia, unspecified: Secondary | ICD-10-CM

## 2018-10-16 DIAGNOSIS — I771 Stricture of artery: Secondary | ICD-10-CM

## 2018-10-16 DIAGNOSIS — Z794 Long term (current) use of insulin: Secondary | ICD-10-CM

## 2018-10-16 LAB — PTH, INTACT AND CALCIUM: Calcium: 10.3 mg/dL (ref 8.7–10.3)

## 2018-10-16 LAB — SPECIMEN STATUS REPORT

## 2018-10-16 NOTE — Telephone Encounter (Signed)
Noted Dr. Estanislado Pandy has already ordered and been sent to [facility] and and [place].

## 2018-10-16 NOTE — Telephone Encounter (Signed)
Patient scheduled for this Friday spoke with Anderson Malta.

## 2018-10-16 NOTE — Telephone Encounter (Signed)
FYI-Pt's sister called asking information on her sister and about her sister's surgery and was also asking who has called the pt from this office. Pt's sister was informed that she is not on a DPR so therefore we can not discuss information with her and that she would have to have the pt come in to fill out a DPR and add her to it. The pt's sister insisted on information and once again she was told that the pt would need to put her on a DPR in order for [place] to speak to her. She said ok and terminated the conversation.

## 2018-10-16 NOTE — Telephone Encounter (Signed)
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 I think she needs referral to Dr. Estanislado Pandy in interventional radiology for cerebral angiogram and any other possible intervention as he sees clinically warranted after angiogram - possibly stenting or aggressive management. Please let patient know and send referral to Dr. Estanislado Pandy, I am including Dr. Estanislado Pandy and patient's pcp on this result note so they are aware. thanks

## 2018-10-16 NOTE — Telephone Encounter (Signed)
Thank you for the update, please let me know if we need to try to contact this patient for you all.  I have been speaking with her son as well who is on her contact list.   Kindest Regards,   Minette Brine, DNP, FNP-BC

## 2018-10-16 NOTE — Chronic Care Management (AMB) (Signed)
  Chronic Care Management   Outreach Note  10/16/2018 Name: Donna [name] MRN: [id number] DOB: 1950-06-11  Referred by: Minette Brine, FNP Reason for referral : Chronic Care Management (CCM RNCM Telephone Follow up )   An unsuccessful telephone outreach was attempted today. The patient was referred to the case management team by Minette Brine FNP for assistance with chronic care management and care coordination.   Follow Up Plan: Telephone follow up appointment with care management team member scheduled for: 10/28/18   Barb Merino, RN, BSN, CCM Care Management Coordinator [facility]  Direct Phone: [phone]

## 2018-10-16 NOTE — Telephone Encounter (Signed)
Thank you for the update!

## 2018-10-16 NOTE — Telephone Encounter (Signed)
-----   Message from Sharon Martin V, RN sent at 09/20/2018  5:33 PM EDT ----- Regarding: NEED SLEEP STUDY  PATIENT  HAD AN APPT WITH DR ACHARYA ON 7/10  DX STROKE , SNORING  ORDERED NOCTURNAL POLY. STUDY  PATIENT AWARE IT MAY TAKE A COUPLE MONTHS  THANKS  SHARON  

## 2018-10-16 NOTE — Telephone Encounter (Signed)
I spoke with the patient and discussed Dr. Cathren Laine message below regarding severe stenosis of branch of MCA. Discussed referral has been sent to Dr. Estanislado Pandy for angiogram and eval for stenosis intervention. She verbalized understanding and did not have any questions. She understands she will receive a separate call to schedule with Dr. Estanislado Pandy.

## 2018-10-16 NOTE — Telephone Encounter (Signed)
Donna [name] can you get this taken care of? Dr. Estanislado Pandy wants her to be seen for angiogram asap and does not need an initial consult (see below for his note on this) please get this scheduled. Let patient know as well thanks (bethany fyi)

## 2018-10-17 ENCOUNTER — Other Ambulatory Visit: Payer: Self-pay | Admitting: Radiology

## 2018-10-17 ENCOUNTER — Ambulatory Visit: Payer: Self-pay | Admitting: Pharmacist

## 2018-10-17 NOTE — Progress Notes (Signed)
  Chronic Care Management   Outreach Note  10/17/2018 Name: Donna [name] MRN: [id number] DOB: 30-Jan-1951  Referred by: Minette Brine, FNP Reason for referral : Chronic Care Management   An unsuccessful telephone outreach was attempted today. The patient was referred to the case management team by for assistance with chronic care management and care coordination.   Follow Up Plan: A HIPPA compliant phone message was left for the patient providing contact information and requesting a return call.  The care management team will reach out to the patient again over the next 5-7 business days.   Regina Eck, PharmD, BCPS Clinical Pharmacist, [facility] [place]: [phone]

## 2018-10-18 ENCOUNTER — Other Ambulatory Visit: Payer: Self-pay

## 2018-10-18 ENCOUNTER — Telehealth: Payer: Self-pay

## 2018-10-18 ENCOUNTER — Other Ambulatory Visit (HOSPITAL_COMMUNITY): Payer: Self-pay | Admitting: Interventional Radiology

## 2018-10-18 ENCOUNTER — Ambulatory Visit (HOSPITAL_COMMUNITY)
Admission: RE | Admit: 2018-10-18 | Discharge: 2018-10-18 | Disposition: A | Discharge status: Home / Self Care | Payer: Medicare HMO | Source: Ambulatory Visit | Attending: Interventional Radiology | Admitting: Interventional Radiology

## 2018-10-18 DIAGNOSIS — I6521 Occlusion and stenosis of right carotid artery: Secondary | ICD-10-CM | POA: Diagnosis not present

## 2018-10-18 DIAGNOSIS — Z79899 Other long term (current) drug therapy: Secondary | ICD-10-CM | POA: Diagnosis not present

## 2018-10-18 DIAGNOSIS — I771 Stricture of artery: Secondary | ICD-10-CM

## 2018-10-18 DIAGNOSIS — Z6830 Body mass index (BMI) 30.0-30.9, adult: Secondary | ICD-10-CM | POA: Diagnosis not present

## 2018-10-18 DIAGNOSIS — Z794 Long term (current) use of insulin: Secondary | ICD-10-CM | POA: Diagnosis not present

## 2018-10-18 DIAGNOSIS — I6622 Occlusion and stenosis of left posterior cerebral artery: Secondary | ICD-10-CM | POA: Insufficient documentation

## 2018-10-18 DIAGNOSIS — E119 Type 2 diabetes mellitus without complications: Secondary | ICD-10-CM | POA: Insufficient documentation

## 2018-10-18 DIAGNOSIS — I6601 Occlusion and stenosis of right middle cerebral artery: Secondary | ICD-10-CM | POA: Diagnosis not present

## 2018-10-18 DIAGNOSIS — E669 Obesity, unspecified: Secondary | ICD-10-CM | POA: Insufficient documentation

## 2018-10-18 DIAGNOSIS — I6523 Occlusion and stenosis of bilateral carotid arteries: Secondary | ICD-10-CM | POA: Diagnosis not present

## 2018-10-18 DIAGNOSIS — I10 Essential (primary) hypertension: Secondary | ICD-10-CM | POA: Diagnosis not present

## 2018-10-18 DIAGNOSIS — Z8673 Personal history of transient ischemic attack (TIA), and cerebral infarction without residual deficits: Secondary | ICD-10-CM | POA: Diagnosis not present

## 2018-10-18 DIAGNOSIS — E785 Hyperlipidemia, unspecified: Secondary | ICD-10-CM | POA: Diagnosis not present

## 2018-10-18 DIAGNOSIS — I6502 Occlusion and stenosis of left vertebral artery: Secondary | ICD-10-CM | POA: Diagnosis not present

## 2018-10-18 HISTORY — PX: IR US GUIDE VASC ACCESS RIGHT: IMG2390

## 2018-10-18 HISTORY — PX: IR ANGIO INTRA EXTRACRAN SEL COM CAROTID INNOMINATE BILAT MOD SED: IMG5360

## 2018-10-18 HISTORY — PX: IR ANGIO VERTEBRAL SEL VERTEBRAL BILAT MOD SED: IMG5369

## 2018-10-18 LAB — CBC
HCT: 43.6 % (ref 36.0–46.0)
Hemoglobin: 14.1 g/dL (ref 12.0–15.0)
MCH: 28.7 pg (ref 26.0–34.0)
MCHC: 32.3 g/dL (ref 30.0–36.0)
MCV: 88.6 fL (ref 80.0–100.0)
Platelets: 254 10*3/uL (ref 150–400)
RBC: 4.92 MIL/uL (ref 3.87–5.11)
RDW: 12.4 % (ref 11.5–15.5)
WBC: 5 10*3/uL (ref 4.0–10.5)
nRBC: 0 % (ref 0.0–0.2)

## 2018-10-18 LAB — PROTIME-INR
INR: 1 (ref 0.8–1.2)
Prothrombin Time: 12.7 seconds (ref 11.4–15.2)

## 2018-10-18 LAB — BASIC METABOLIC PANEL
Anion gap: 12 (ref 5–15)
BUN: 25 mg/dL — ABNORMAL HIGH (ref 8–23)
CO2: 26 mmol/L (ref 22–32)
Calcium: 9.6 mg/dL (ref 8.9–10.3)
Chloride: 97 mmol/L — ABNORMAL LOW (ref 98–111)
Creatinine, Ser: 1 mg/dL (ref 0.44–1.00)
GFR calc Af Amer: >60 mL/min (ref >60)
GFR calc non Af Amer: 58 mL/min — ABNORMAL LOW (ref >60)
Glucose, Bld: 305 mg/dL — ABNORMAL HIGH (ref 70–99)
Potassium: 4.7 mmol/L (ref 3.5–5.1)
Sodium: 135 mmol/L (ref 135–145)

## 2018-10-18 LAB — GLUCOSE, CAPILLARY
Glucose-Capillary: 279 mg/dL — ABNORMAL HIGH (ref 70–99)
Glucose-Capillary: 275 mg/dL — ABNORMAL HIGH (ref 70–99)

## 2018-10-18 MED ORDER — SODIUM CHLORIDE 0.9 % IV SOLN
Freq: Once | INTRAVENOUS | Status: AC
  Administered 2018-10-18: 11:00:00 via INTRAVENOUS

## 2018-10-18 MED ORDER — FENTANYL CITRATE (PF) 100 MCG/2ML IJ SOLN
INTRAMUSCULAR | Status: AC | PRN
  Administered 2018-10-18: 25 ug via INTRAVENOUS

## 2018-10-18 MED ORDER — VERAPAMIL HCL 2.5 MG/ML IV SOLN
INTRAVENOUS | Status: AC
  Filled 2018-10-18: qty 2

## 2018-10-18 MED ORDER — SODIUM CHLORIDE 0.9 % IV SOLN: INTRAVENOUS | Status: AC

## 2018-10-18 MED ORDER — IOHEXOL 300 MG/ML  SOLN
50.0000 mL | Freq: Once | INTRAMUSCULAR | Status: AC | PRN
  Administered 2018-10-18: 20 mL via INTRA_ARTERIAL

## 2018-10-18 MED ORDER — HEPARIN SODIUM (PORCINE) 1000 UNIT/ML IJ SOLN
INTRAMUSCULAR | Status: AC
  Filled 2018-10-18: qty 1

## 2018-10-18 MED ORDER — FENTANYL CITRATE (PF) 100 MCG/2ML IJ SOLN
INTRAMUSCULAR | Status: AC
  Filled 2018-10-18: qty 2

## 2018-10-18 MED ORDER — HYDRALAZINE HCL 20 MG/ML IJ SOLN
INTRAMUSCULAR | Status: AC
  Filled 2018-10-18: qty 1

## 2018-10-18 MED ORDER — LIDOCAINE HCL 1 % IJ SOLN
INTRAMUSCULAR | Status: AC
  Filled 2018-10-18: qty 20

## 2018-10-18 MED ORDER — LIDOCAINE HCL 1 % IJ SOLN
INTRAMUSCULAR | Status: AC | PRN
  Administered 2018-10-18: 5 mL

## 2018-10-18 MED ORDER — NITROGLYCERIN 1 MG/10 ML FOR IR/CATH LAB
INTRA_ARTERIAL | Status: AC
  Filled 2018-10-18: qty 10

## 2018-10-18 MED ORDER — HYDRALAZINE HCL 20 MG/ML IJ SOLN
INTRAMUSCULAR | Status: AC | PRN
  Administered 2018-10-18 (×3): 5 mg via INTRAVENOUS

## 2018-10-18 MED ORDER — IOHEXOL 300 MG/ML  SOLN
150.0000 mL | Freq: Once | INTRAMUSCULAR | Status: AC | PRN
  Administered 2018-10-18: 85 mL via INTRA_ARTERIAL

## 2018-10-18 MED ORDER — MIDAZOLAM HCL 2 MG/2ML IJ SOLN
INTRAMUSCULAR | Status: AC
  Filled 2018-10-18: qty 2

## 2018-10-18 MED ORDER — MIDAZOLAM HCL 2 MG/2ML IJ SOLN
INTRAMUSCULAR | Status: AC | PRN
  Administered 2018-10-18: 1 mg via INTRAVENOUS

## 2018-10-18 NOTE — Sedation Documentation (Signed)
ETCO2 monitor/O2 removed per MD

## 2018-10-18 NOTE — Telephone Encounter (Signed)
Patient's forms have been completed and faxed over to [facility]. I have also mailed the pt a copy as well with the confirmation. Pt is aware. YRL,RMA

## 2018-10-18 NOTE — Progress Notes (Signed)
Discharge instructions reviewed with pt and her sister both voice understanding.  

## 2018-10-18 NOTE — Procedures (Signed)
S/P 4 vessel cerebral arteriograms. RT Rad approach. Findings. 1.1.Severe RT MCA M 1 stenosis. 2.Severe Lt VBJ stenosis. 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 4.Approx 60 to 70 Stenosis of RT ICA cervical pet junction. 5.Approx 50 % stenosis of RT ICA at the bulb

## 2018-10-18 NOTE — Discharge Instructions (Signed)
Radial Site Care ° °This sheet gives you information about how to care for yourself after your procedure. Your health care provider may also give you more specific instructions. If you have problems or questions, contact your health care provider. °What can I expect after the procedure? °After the procedure, it is common to have: °· Bruising and tenderness at the catheter insertion area. °Follow these instructions at home: °Medicines °· Take over-the-counter and prescription medicines only as told by your health care provider. °Insertion site care °· Follow instructions from your health care provider about how to take care of your insertion site. Make sure you: °? Wash your hands with soap and water before you change your bandage (dressing). If soap and water are not available, use hand sanitizer. °? Change your dressing as told by your health care provider. °? Leave stitches (sutures), skin glue, or adhesive strips in place. These skin closures may need to stay in place for 2 weeks or longer. If adhesive strip edges start to loosen and curl up, you may trim the loose edges. Do not remove adhesive strips completely unless your health care provider tells you to do that. °· Check your insertion site every day for signs of infection. Check for: °? Redness, swelling, or pain. °? Fluid or blood. °? Pus or a bad smell. °? Warmth. °· Do not take baths, swim, or use a hot tub until your health care provider approves. °· You may shower 24-48 hours after the procedure, or as directed by your health care provider. °? Remove the dressing and gently wash the site with plain soap and water. °? Pat the area dry with a clean towel. °? Do not rub the site. That could cause bleeding. °· Do not apply powder or lotion to the site. °Activity ° °· For 24 hours after the procedure, or as directed by your health care provider: °? Do not flex or bend the affected arm. °? Do not push or pull heavy objects with the affected arm. °? Do not  drive yourself home from the hospital or clinic. You may drive 24 hours after the procedure unless your health care provider tells you not to. °? Do not operate machinery or power tools. °· Do not lift anything that is heavier than 10 lb (4.5 kg), or the limit that you are told, until your health care provider says that it is safe. °· Ask your health care provider when it is okay to: °? Return to work or school. °? Resume usual physical activities or sports. °? Resume sexual activity. °General instructions °· If the catheter site starts to bleed, raise your arm and put firm pressure on the site. If the bleeding does not stop, get help right away. This is a medical emergency. °· If you went home on the same day as your procedure, a responsible adult should be with you for the first 24 hours after you arrive home. °· Keep all follow-up visits as told by your health care provider. This is important. °Contact a health care provider if: °· You have a fever. °· You have redness, swelling, or yellow drainage around your insertion site. °Get help right away if: °· You have unusual pain at the radial site. °· The catheter insertion area swells very fast. °· The insertion area is bleeding, and the bleeding does not stop when you hold steady pressure on the area. °· Your arm or hand becomes pale, cool, tingly, or numb. °These symptoms may represent a serious problem   that is an emergency. Do not wait to see if the symptoms will go away. Get medical help right away. Call your local emergency services (911 in the [place] Do not drive yourself to the hospital. °Summary °· After the procedure, it is common to have bruising and tenderness at the site. °· Follow instructions from your health care provider about how to take care of your radial site wound. Check the wound every day for signs of infection. °· Do not lift anything that is heavier than 10 lb (4.5 kg), or the limit that you are told, until your health care provider says  that it is safe. °This information is not intended to replace advice given to you by your health care provider. Make sure you discuss any questions you have with your health care provider. °Document Released: 04/01/2010 Document Revised: 04/04/2017 Document Reviewed: 04/04/2017 °Elsevier Patient Education © 2020 [employer] ° °

## 2018-10-18 NOTE — H&P (Signed)
Chief Complaint: Patient was seen in consultation today for right MCA M1 segment stenosis/diagnostic cerebral arteriogram.  Referring Physician(s): Melvenia Beam  Supervising Physician: Luanne Bras  Patient Status: [facility] - Out-pt  History of Present Illness: Donna [name] is a 68 y.o. female with a past medical history of hypertension, hyperlipidemia, TIAs, CVA 06/2018, diabetes mellitus, and obesity. She was referred to neurology due to recent CVA. She met with Dr. Jaynee Eagles who ordered imaging scans for further investigation. MR/MRA brain/head 10/09/2018 revealed severe right MCA M1 segment stenosis, thought to be the cause of her recent CVA.  MR/MRA brain/head 10/09/2018: 1. Chronic right lentiform nucleus ischemic infarct, with ex vacuo dilation of right lateral ventricle. 2. Scattered mild periventricular subcortical chronic small vessel ischemic disease. 3. No acute findings. 4. Right middle cerebral artery M1 segment has severe stenosis.  Distal M2 and M3 branches appear to have decreased flow signal compared to the left side. 5. Mild atherosclerosis of left vertebral, basilar arteries and right P1 segment of posterior cerebral artery.   NIR consulted by Dr. Jaynee Eagles for possible image-guided diagnostic cerebral arteriogram to evaluate patient's right MCA M1 segment stenosis. Patient awake and alert sitting in bed with no complaints at this time. Denies fever, chills, chest pain, dyspnea, abdominal pain, or headache.  Patient is currently taking Aspirin 325 mg once daily.   Past Medical History:  Diagnosis Date   Diabetes mellitus without complication ([place])    Hyperlipidemia    Hypertension     Past Surgical History:  Procedure Laterality Date   MYOMECTOMY      Allergies: Patient has no known allergies.  Medications: Prior to Admission medications   Medication Sig Start Date End Date Taking? Authorizing Provider  amLODipine (NORVASC) 5 MG tablet Take 5 mg  by mouth daily.    [provider]  aspirin 325 MG tablet Take 325 mg by mouth daily.    [provider]  blood glucose meter kit and supplies KIT Dispense based on patient and insurance preference. Use up to four times daily as directed. (FOR ICD-9 250.00, 250.01). Patient not taking: Reported on 10/07/2018 05/10/18   Minette Brine, FNP  CALCIUM PO Take by mouth.    [provider]  carvedilol (COREG) 6.25 MG tablet Take 1 tablet (6.25 mg total) by mouth 2 (two) times daily. 09/20/18   Elouise Munroe, MD  Insulin Glargine-Lixisenatide (SOLIQUA) 100-33 UNT-MCG/ML SOPN Inject 20 Units into the skin at bedtime.    [provider]  Multiple Vitamin (MULTIVITAMIN PO) Take by mouth.    [provider]  olmesartan-hydrochlorothiazide (BENICAR HCT) 40-25 MG tablet Take 1 tablet by mouth daily. 04/29/18   Minette Brine, FNP  OVER THE COUNTER MEDICATION Omega Plus    [provider]  telmisartan-hydrochlorothiazide (MICARDIS HCT) 40-12.5 MG tablet Take 1 tablet by mouth once daily 10/14/18   Minette Brine, FNP  VITAMIN D PO Take by mouth.    [provider]     Family History  Problem Relation Age of Onset   Stroke Mother    Diabetes Father    Diabetes Sister    Breast cancer Neg Hx     Social History   Socioeconomic History   Marital status: Legally Separated    Spouse name: Not on file   Number of children: Not on file   Years of education: Not on file   Highest education level: Not on file  Occupational History   Not on file  Social  Needs   Financial resource strain: Not hard at all   Food insecurity    Worry: Never true    Inability: Never true   Transportation needs    Medical: No    Non-medical: No  Tobacco Use   Smoking status: Never Smoker   Smokeless tobacco: Never Used  Substance and Sexual Activity   Alcohol use: No   Drug use: No   Sexual activity: Not Currently  Lifestyle   Physical  activity    Days per week: 0 days    Minutes per session: 0 min   Stress: Not at all  Relationships   Social connections    Talks on phone: Not on file    Gets together: Not on file    Attends religious service: Not on file    Active member of club or organization: Not on file    Attends meetings of clubs or organizations: Not on file    Relationship status: Not on file  Other Topics Concern   Not on file  Social History Narrative   Lives at home alone   Right handed     Review of Systems: A 12 point ROS discussed and pertinent positives are indicated in the HPI above.  All other systems are negative.  Review of Systems  Constitutional: Negative for chills and fever.  Respiratory: Negative for shortness of breath and wheezing.   Cardiovascular: Negative for chest pain and palpitations.  Gastrointestinal: Negative for abdominal pain.  Neurological: Negative for headaches.  Psychiatric/Behavioral: Negative for behavioral problems and confusion.    Vital Signs: BP (!) 203/67    Pulse 88    Temp (!) 97.5 F (36.4 C) (Oral)    Resp 18    Ht 5' 1"  (1.549 m)    Wt 164 lb (74.4 kg)    SpO2 100%    BMI 30.99 kg/m   Physical Exam Vitals signs and nursing note reviewed.  Constitutional:      General: She is not in acute distress.    Appearance: Normal appearance.  Cardiovascular:     Rate and Rhythm: Normal rate and regular rhythm.     Heart sounds: Normal heart sounds. No murmur.  Pulmonary:     Effort: Pulmonary effort is normal. No respiratory distress.     Breath sounds: Normal breath sounds. No wheezing.  Skin:    General: Skin is warm and dry.  Neurological:     Mental Status: She is alert and oriented to person, place, and time.  Psychiatric:        Mood and Affect: Mood normal.        Behavior: Behavior normal.        Thought Content: Thought content normal.        Judgment: Judgment normal.      MD Evaluation Airway: WNL Heart: WNL Abdomen: WNL Chest/  Lungs: WNL ASA  Classification: 3 Mallampati/Airway Score: Two   Imaging: Mr Angio Head Wo Contrast  Result Date: 10/10/2018 [facility] NEUROIMAGING REPORT STUDY DATE: 10/09/18 PATIENT NAME: Donna [name] DOB: 1950/03/23 MRN: [id number] ORDERING CLINICIAN: Sarina Ill, MD CLINICAL HISTORY: 68 year old female with stroke. EXAM: MRA head (without) TECHNIQUE: MR angiogram of the head was obtained utilizing 3D time of flight sequences from below the vertebrobasilar junction up to the intracranial vasculature without contrast.  Computerized reconstructions were obtained. CONTRAST: no COMPARISON: none IMAGING SITE: [facility] [address] (1.5 Tesla MRI)  FINDINGS: This study is of adequate  technical quality. Flow signal of the bilateral internal carotid arteries have no stenosis. Right middle cerebral artery M1 segment has severe stenosis.  Distal M2 and M3 branches appear to have decreased flow signal. The left middle and bilateral anterior cerebral arteries have no stenosis.  Left vertebral artery is dominant with focal area of decreased signal in the V4 segment which may be artifactual.  The right vertebral artery is hypoplastic.  Mild irregularity in the mid to upper portion of basilar artery.  Right P1 segment has focal stenosis.  Bilateral posterior communicating arteries are present.  No aneurysmal dilatations are seen.   MRA head (without) demonstrating: - Right middle cerebral artery M1 segment has severe stenosis.  Distal M2 and M3 branches appear to have decreased flow signal compared to the left side. - Mild atherosclerosis of left vertebral, basilar arteries and right P1 segment of posterior cerebral artery. INTERPRETING PHYSICIAN: Penni Bombard, MD Certified in Neurology, Neurophysiology and Neuroimaging [facility] [address], [place], [place] [address] [phone]   Mr Brain Wo Contrast  Result Date:  10/10/2018 [facility] NEUROIMAGING REPORT STUDY DATE: 10/09/18 PATIENT NAME: Donna [name] DOB: Jan 15, 1951 MRN: [id number] ORDERING CLINICIAN: 10/09/18 CLINICAL HISTORY: 68 year old female with history of stroke. EXAM: MRI brain (without) TECHNIQUE: MRI of the brain without contrast was obtained utilizing 5 mm axial slices with T1, T2, T2 flair, SWI and diffusion weighted views.  T1 sagittal and T2 coronal views were obtained. CONTRAST: no COMPARISON: 07/09/18 CT IMAGING SITE: [facility] [address] (1.5 Tesla MRI)  FINDINGS: Chronic right lentiform nucleus ischemic infarct, with ex vacuo dilation of right lateral ventricle. Scattered mild periventricular subcortical chronic small vessel ischemic disease. No abnormal lesions are seen on diffusion-weighted views to suggest acute ischemia. The cortical sulci, fissures and cisterns are normal in size and appearance. Lateral, third and fourth ventricle are normal in size and appearance. No extra-axial fluid collections are seen. No evidence of mass effect or midline shift.  On sagittal views the posterior fossa, pituitary gland and corpus callosum are unremarkable. No evidence of intracranial hemorrhage on SWI views. The orbits and their contents, paranasal sinuses and calvarium are unremarkable.  Intracranial flow voids are present.   MRI brain (without) demonstrating: - Chronic right lentiform nucleus ischemic infarct, with ex vacuo dilation of right lateral ventricle. - Scattered mild periventricular subcortical chronic small vessel ischemic disease. - No acute findings. INTERPRETING PHYSICIAN: Penni Bombard, MD Certified in Neurology, Neurophysiology and Neuroimaging [facility] [address], [place], [place] [address] [phone]   Vas US Carotid  Result Date: 10/03/2018 Carotid Arterial Duplex Study Indications:       Bilateral carotid artery stenosis. Patient denies any                     cerebrovascular symptoms. Risk Factors:      Hypertension, hyperlipidemia, Diabetes, no history of                    smoking, prior CVA. Comparison Study:  In 07/2017, a carotid duplex showed velocities of 64/18 cm/s                    in the RICA and 149/70 cm/s in the LICA. Performing Technologist: Sharlett Iles RVT  Examination Guidelines: A complete evaluation includes B-mode imaging, spectral Doppler, color Doppler, and power Doppler as needed of all accessible portions of each vessel.  Bilateral testing is considered an integral part of a complete examination. Limited examinations for reoccurring indications may be performed as noted.  Right Carotid Findings: +----------+--------+--------+------------+------------+-----------------------+             PSV cm/s EDV cm/s Stenosis     Describe     Comments                 +----------+--------+--------+------------+------------+-----------------------+  CCA Prox   74       6                                  intimal thickening and                                                           tortuous                 +----------+--------+--------+------------+------------+-----------------------+  CCA Distal 66       7                                  intimal thickening       +----------+--------+--------+------------+------------+-----------------------+  ICA Prox   50       6                     heterogenous Shadowing                +----------+--------+--------+------------+------------+-----------------------+  ICA Mid    61       13       Stable 1-39%              intimal thickening       +----------+--------+--------+------------+------------+-----------------------+  ICA Distal 62       14                                                          +----------+--------+--------+------------+------------+-----------------------+  ECA        128      5                                                            +----------+--------+--------+------------+------------+-----------------------+ +----------+--------+-------+----------------+-------------------+             PSV cm/s EDV cms Describe         Arm Pressure (mmHG)  +----------+--------+-------+----------------+-------------------+  Subclavian 99               Multiphasic, WNL 190                  +----------+--------+-------+----------------+-------------------+ +---------+--------+--+--------+-+---------+  Vertebral PSV cm/s 45 EDV cm/s 5 Antegrade  +---------+--------+--+--------+-+---------+  Left Carotid Findings: +----------+--------+--------+---------+--------------------+------------------+             PSV cm/s EDV cm/s Stenosis  Describe  Comments            +----------+--------+--------+---------+--------------------+------------------+  CCA Prox   72       10                                                          +----------+--------+--------+---------+--------------------+------------------+  CCA Distal 49       9                                       intimal thickening  +----------+--------+--------+---------+--------------------+------------------+  ICA Prox   153      21       Now       heterogenous and     Shadowing and                                     40-59%    irregular            tortuous; stenosis                                                               based on peak                                                                    systolic                                                                         velocities and                                                                   plaque formation    +----------+--------+--------+---------+--------------------+------------------+  ICA Mid    136      20                                      tortuous and  turbulent            +----------+--------+--------+---------+--------------------+------------------+  ICA Distal 77       17                                      tortuous            +----------+--------+--------+---------+--------------------+------------------+  ECA        598      32       >50%      heterogenous and     tortuous                                                    irregular                                +----------+--------+--------+---------+--------------------+------------------+ +----------+--------+--------+----------------+-------------------+  Subclavian PSV cm/s EDV cm/s Describe         Arm Pressure (mmHG)  +----------+--------+--------+----------------+-------------------+             132               Multiphasic, WNL 190                  +----------+--------+--------+----------------+-------------------+ +---------+--------+--+--------+--+---------+  Vertebral PSV cm/s 56 EDV cm/s 11 Antegrade  +---------+--------+--+--------+--+---------+  Summary: Right Carotid: Velocities in the right ICA are consistent with a 1-39% stenosis.                The RICA velocities remain within normal range and stable                compared to the prior exam. Left Carotid: Velocities in the left ICA are now consistent with a 40-59%               stenosis, based on peak systolic velocities and plaque formation.               The ECA appears >50% stenosed. The LICA velocities are elevated               and essentially stable compared to the prior exam. The LECA               velocities have increased compared to the prior exam. Vertebrals:  Bilateral vertebral arteries demonstrate antegrade flow. Small              caliber right vertebral artery. Subclavians: Normal flow hemodynamics were seen in bilateral subclavian              arteries. *See table(s) above for measurements and observations. Suggest follow up study in 12 months. Electronically signed by Quay Burow MD on 10/03/2018 at 5:02:02 PM.    Final      Labs:  CBC: Recent Labs    10/18/18 1034  WBC 5.0  HGB 14.1  HCT 43.6  PLT 254    COAGS: Recent Labs    10/18/18 1034  INR 1.0    BMP: Recent Labs    04/04/18 1536 07/08/18 1417 10/07/18 1259 10/18/18 1034  NA 137 142 136 135  K 4.1 4.6 4.5 4.7  CL 95* 102 94* 97*  CO2 26 24 27  26  GLUCOSE 330* 134* 310* 305*  BUN 23 25 20  25*  CALCIUM 9.7 9.6 10.3   10.5* 9.6  CREATININE 1.00 0.83 1.00 1.00  GFRNONAA 59* 73 58* 58*  GFRAA 68 84 67 >60    LIVER FUNCTION TESTS: Recent Labs    04/04/18 1536 10/07/18 1259  BILITOT 0.2 0.3  AST 17 17  ALT 28 20  ALKPHOS 83 73  PROT 7.2 7.3  ALBUMIN 3.9 4.1     Assessment and Plan:  Right MCA M1 segment stenosis. Plan for image-guided diagnostic cerebral arteriogram today with Dr. Estanislado Pandy. Patient is NPO. Afebrile and WBCs WNL. Ok to proceed with Aspirin use per Dr. Estanislado Pandy. INR 1.0 today.  Risks and benefits of cerebral arteriogram were discussed with the patient including, but not limited to bleeding, infection, vascular injury, stroke, or contrast induced renal failure. This interventional procedure involves the use of X-rays and because of the nature of the planned procedure, it is possible that we will have prolonged use of X-ray fluoroscopy. Potential radiation risks to you include (but are not limited to) the following: - A slightly elevated risk for cancer  several years later in life. This risk is typically less than 0.5% percent. This risk is low in comparison to the normal incidence of human cancer, which is 33% for women and 50% for men according to the [employer]. - Radiation induced injury can include skin redness, resembling a rash, tissue breakdown / ulcers and hair loss (which can be temporary or permanent).  The likelihood of either of these occurring depends on the difficulty of the procedure and whether you are sensitive to radiation due to previous procedures, disease, or genetic  conditions.  IF your procedure requires a prolonged use of radiation, you will be notified and given written instructions for further action.  It is your responsibility to monitor the irradiated area for the 2 weeks following the procedure and to notify your physician if you are concerned that you have suffered a radiation induced injury.   All of the patient's questions were answered, patient is agreeable to proceed. Consent signed and in chart.   Thank you for this interesting consult.  I greatly enjoyed meeting [employer] and look forward to participating in their care.  A copy of this report was sent to the requesting provider on this date.  Electronically Signed: Earley Abide, PA-C 10/18/2018, 12:02 PM   I spent a total of 40 Minutes in face to face in clinical consultation, greater than 50% of which was counseling/coordinating care for right MCA M1 segment stenosis/diagnostic cerebral arteriogram.

## 2018-10-18 NOTE — Progress Notes (Addendum)
Upon receiving pt to short stay right wrist with swelling noted Dr Estanislado Pandy in to assess pt wrist. Pt doesn't c/o pain at site. Dr Blenda Nicely feels site is ok, no new orders. Will monitor.

## 2018-10-21 ENCOUNTER — Other Ambulatory Visit (HOSPITAL_COMMUNITY): Payer: Self-pay | Admitting: Interventional Radiology

## 2018-10-21 ENCOUNTER — Telehealth (HOSPITAL_COMMUNITY): Payer: Self-pay

## 2018-10-21 DIAGNOSIS — I771 Stricture of artery: Secondary | ICD-10-CM

## 2018-10-21 NOTE — Telephone Encounter (Signed)
Called to schedule consult, no answer, vm full. AW  

## 2018-10-22 ENCOUNTER — Encounter (HOSPITAL_COMMUNITY): Payer: Self-pay | Admitting: Interventional Radiology

## 2018-10-22 NOTE — Telephone Encounter (Signed)
Patient has not responded to messages 7/15--10/01/18 or letter mailed 10/01/18

## 2018-10-23 ENCOUNTER — Encounter (HOSPITAL_COMMUNITY): Payer: Self-pay

## 2018-10-23 ENCOUNTER — Other Ambulatory Visit (HOSPITAL_COMMUNITY): Payer: Self-pay | Admitting: Interventional Radiology

## 2018-10-23 ENCOUNTER — Telehealth (HOSPITAL_COMMUNITY): Payer: Self-pay

## 2018-10-23 DIAGNOSIS — I771 Stricture of artery: Secondary | ICD-10-CM

## 2018-10-23 NOTE — Telephone Encounter (Signed)
Called to schedule consult, vm full. AW 

## 2018-10-24 ENCOUNTER — Other Ambulatory Visit: Payer: Self-pay

## 2018-10-24 ENCOUNTER — Ambulatory Visit: Payer: Medicare HMO | Admitting: Pharmacist

## 2018-10-24 DIAGNOSIS — I10 Essential (primary) hypertension: Secondary | ICD-10-CM

## 2018-10-24 DIAGNOSIS — Z794 Long term (current) use of insulin: Secondary | ICD-10-CM

## 2018-10-24 DIAGNOSIS — E119 Type 2 diabetes mellitus without complications: Secondary | ICD-10-CM

## 2018-10-24 DIAGNOSIS — E785 Hyperlipidemia, unspecified: Secondary | ICD-10-CM

## 2018-10-24 MED ORDER — SOLIQUA 100-33 UNT-MCG/ML ~~LOC~~ SOPN: 20.0000 [IU] | PEN_INJECTOR | Freq: Every day | SUBCUTANEOUS | 0 refills | Status: DC

## 2018-10-25 ENCOUNTER — Other Ambulatory Visit (HOSPITAL_COMMUNITY)
Admission: RE | Admit: 2018-10-25 | Discharge: 2018-10-25 | Disposition: A | Discharge status: Home / Self Care | Payer: Medicare HMO | Source: Ambulatory Visit | Attending: Cardiovascular Disease | Admitting: Cardiovascular Disease

## 2018-10-25 DIAGNOSIS — Z20828 Contact with and (suspected) exposure to other viral communicable diseases: Secondary | ICD-10-CM | POA: Diagnosis not present

## 2018-10-25 DIAGNOSIS — Z01812 Encounter for preprocedural laboratory examination: Secondary | ICD-10-CM | POA: Diagnosis not present

## 2018-10-25 LAB — SARS CORONAVIRUS 2 (TAT 6-24 HRS): SARS Coronavirus 2: NEGATIVE

## 2018-10-28 ENCOUNTER — Ambulatory Visit (INDEPENDENT_AMBULATORY_CARE_PROVIDER_SITE_OTHER): Payer: Medicare HMO

## 2018-10-28 ENCOUNTER — Telehealth: Payer: Medicare HMO

## 2018-10-28 DIAGNOSIS — E119 Type 2 diabetes mellitus without complications: Secondary | ICD-10-CM | POA: Diagnosis not present

## 2018-10-28 DIAGNOSIS — E785 Hyperlipidemia, unspecified: Secondary | ICD-10-CM | POA: Diagnosis not present

## 2018-10-28 DIAGNOSIS — I10 Essential (primary) hypertension: Secondary | ICD-10-CM | POA: Diagnosis not present

## 2018-10-28 DIAGNOSIS — Z794 Long term (current) use of insulin: Secondary | ICD-10-CM | POA: Diagnosis not present

## 2018-10-28 NOTE — Progress Notes (Signed)
Chronic Care Management   Visit Note  10/24/2018 Name: Donna [name] MRN: [id number] DOB: 05/05/1950  Referred by: Donna Brine, FNP Reason for referral : Chronic Care Management   Donna [name] is a 68 y.o. year old female who is a primary care patient of Donna [name], [place]. The CCM team was consulted for assistance with chronic disease management and care coordination needs.   Review of patient status, including review of consultants reports, relevant laboratory and other test results, and collaboration with appropriate care team members and the patient's provider was performed as part of comprehensive patient evaluation and provision of chronic care management services.    I spoke with [name] by telephone today  Medications: Outpatient Encounter Medications as of 10/24/2018  Medication Sig Note  . blood glucose meter kit and supplies KIT Dispense based on patient and insurance preference. Use up to four times daily as directed. (FOR ICD-9 250.00, 250.01). 06/13/2018: Bought Reli[employer] meter from [place]    . CALCIUM PO Take by mouth.   . carvedilol (COREG) 6.25 MG tablet Take 1 tablet (6.25 mg total) by mouth 2 (two) times daily.   . Insulin Glargine-Lixisenatide (SOLIQUA) 100-33 UNT-MCG/ML SOPN Inject 20 Units into the skin at bedtime.   . Multiple Vitamin (MULTIVITAMIN PO) Take by mouth.   Marland Kitchen OVER THE COUNTER MEDICATION Omega Plus   . telmisartan-hydrochlorothiazide (MICARDIS HCT) 40-12.5 MG tablet Take 1 tablet by mouth once daily   . VITAMIN D PO Take by mouth.   Marland Kitchen amLODipine (NORVASC) 5 MG tablet Take 5 mg by mouth daily.   Marland Kitchen aspirin 325 MG tablet Take 325 mg by mouth daily. 08/29/2018: Does not take daily  . [DISCONTINUED] olmesartan-hydrochlorothiazide (BENICAR HCT) 40-25 MG tablet Take 1 tablet by mouth daily.    No facility-administered encounter medications on file as of 10/24/2018.      Objective:   Goals Addressed            This Visit's Progress     Patient  Stated   . I need help paying for my medications (pt-stated)       Current Barriers:  . Financial Barriers: patient has [employer] and reports copay for [employer] ($134/month) is cost prohibitive at this time  Pharmacist Clinical Goal(s):  Marland Kitchen Over the next 30 days, patient will work with PharmD and providers to relieve medication access concerns  Interventions: . Comprehensive medication review completed; medication list updated in electronic medical record.  [name] by [employer]: Patient meets income/3% out of pocket spend criteria for this medication's patient assistance program. Reviewed application process. Patient will provide proof of income, out of pocket spend report, and will sign application. Will collaborate with primary care provider, Donna Brine, DNP, FNP for their portion of application. Once completed, will submit to [facility] patient assistance program.  Patient Self Care Activities:  . Patient will provide necessary portions of application   Initial goal documentation      . I need to control & monitor my blood sugar (pt-stated)       Current Barriers:  . Diabetes: T2DM; most recent A1c 12.3% on 10/09/18 (A1c has increased since April 2002 when it was 9.1).  Patient states her diet has not been ideal.  Since her son is staying with her, he is trying to prepare appropriate meals.   . Current antihyperglycemic regimen: Soliqua 20 units daily (reports compliance) . Denies hypoglycemic symptoms . Reports hyperglycemic symptoms: headaches, polydipsia . Current meal patterns: son is cooking  lower carbohydrate meals o Drinks: sugary drinks, trying to drink more water . Current exercise: trying to walk . Current blood glucose readings: 200s . Cardiovascular risk reduction: o Current hypertensive regimen: amlodipine (not taking), carvedilol, telmisartan/hctz o Current hyperlipidemia regimen: n/a-->statin intolerance (appt with lipid clinic to arrange Repatha) o Reinforce  importance of daily aspirin.  Patient not taking as presribed.  She is also not taking clopidogrel.  Pharmacist Clinical Goal(s):  Marland Kitchen Over the next 90 days, patient with work with PharmD and primary care provider to address optimized medication & disease state management (diabetes).  Interventions: . Comprehensive medication review performed, medication list updated in electronic medical record . Reviewed medication fill history via insurance claims data.  . Reviewed & discussed the following diabetes-related information with patient: o Continue checking blood sugars as directed o Follow ADA recommended "diabetes-friendly" diet  (reviewed healthy snack/food options).  Mailed diabetes materials and snack food options.  Suggested low/zero sugar drinks for patient to try.  She states she is having trouble just drinking water. o Discussed insulin/GLP-1 injection technique/instructions. o Reviewed medication purpose/side effects-->patient denies adverse events, denies hypoglycemia o Continue taking all medications as prescribed by provider  Patient Self Care Activities:  . Patient will check blood glucose daily , document, and provide at future appointments . Patient will focus on medication adherence by taking all medications as prescribed. . Patient will take medications as prescribed . Patient will contact provider with any episodes of hypoglycemia . Patient will report any questions or concerns to provider    Please see past updates related to this goal by clicking on the "Past Updates" button in the selected goal         Plan:   The care management team will reach out to the patient again over the next 3-4 weeks.  Regina Eck, PharmD, BCPS Clinical Pharmacist, [facility] [place]: [phone]

## 2018-10-28 NOTE — Chronic Care Management (AMB) (Signed)
  Chronic Care Management   Outreach Note  10/28/2018 Name: Donna [name] MRN: [id number] DOB: 12-08-1950  Referred by: Minette Brine, FNP Reason for referral : Chronic Care Management (CCM RNCM Telephone Follow up )   A second unsuccessful telephone outreach was attempted today. The patient was referred to the case management team for assistance with chronic care management and care coordination. Unfortunately, the voicemail box is full and will not allow voice messages.   Follow Up Plan: Telephone follow up appointment with care management team member scheduled for:   Barb Merino, RN, BSN, CCM Care Management Coordinator [place] [facility]  Direct Phone: [phone]

## 2018-10-28 NOTE — Patient Instructions (Signed)
Visit Information  Goals Addressed            This Visit's Progress     Patient Stated   . I need help paying for my medications (pt-stated)       Current Barriers:  . Financial Barriers: patient has [employer] and reports copay for [employer] ($134/month) is cost prohibitive at this time  Pharmacist Clinical Goal(s):  Marland Kitchen Over the next 30 days, patient will work with PharmD and providers to relieve medication access concerns  Interventions: . Comprehensive medication review completed; medication list updated in electronic medical record.  [name] [employer]: Patient meets income/3% out of pocket spend criteria for this medication's patient assistance program. Reviewed application process. Patient will provide proof of income, out of pocket spend report, and will sign application. Will collaborate with primary care provider, Minette Brine, DNP, FNP for their portion of application. Once completed, will submit to [facility] patient assistance program.  Patient Self Care Activities:  . Patient will provide necessary portions of application   Initial goal documentation      . I need to control & monitor my blood sugar (pt-stated)       Current Barriers:  . Diabetes: T2DM; most recent A1c 12.3% on 10/09/18 (A1c has increased since April 2002 when it was 9.1).  Patient states her diet has not been ideal.  Since her son is staying with her, he is trying to prepare appropriate meals.   . Current antihyperglycemic regimen: Soliqua 20 units daily (reports compliance) . Denies hypoglycemic symptoms . Reports hyperglycemic symptoms: headaches, polydipsia . Current meal patterns: son is cooking lower carbohydrate meals o Drinks: sugary drinks, trying to drink more water . Current exercise: trying to walk . Current blood glucose readings: 200s . Cardiovascular risk reduction: o Current hypertensive regimen: amlodipine (not taking), carvedilol, telmisartan/hctz o Current hyperlipidemia regimen:  n/a-->statin intolerance (appt with lipid clinic to arrange Repatha) o Reinforce importance of daily aspirin.  Patient not taking as presribed.  She is also not taking clopidogrel.  Pharmacist Clinical Goal(s):  Marland Kitchen Over the next 90 days, patient with work with PharmD and primary care provider to address optimized medication & disease state management (diabetes).  Interventions: . Comprehensive medication review performed, medication list updated in electronic medical record . Reviewed medication fill history via insurance claims data.  . Reviewed & discussed the following diabetes-related information with patient: o Continue checking blood sugars as directed o Follow ADA recommended "diabetes-friendly" diet  (reviewed healthy snack/food options).  Mailed diabetes materials and snack food options.  Suggested low/zero sugar drinks for patient to try.  She states she is having trouble just drinking water. o Discussed insulin/GLP-1 injection technique/instructions. o Reviewed medication purpose/side effects-->patient denies adverse events, denies hypoglycemia o Continue taking all medications as prescribed by provider  Patient Self Care Activities:  . Patient will check blood glucose daily , document, and provide at future appointments . Patient will focus on medication adherence by taking all medications as prescribed. . Patient will take medications as prescribed . Patient will contact provider with any episodes of hypoglycemia . Patient will report any questions or concerns to provider    Please see past updates related to this goal by clicking on the "Past Updates" button in the selected goal         The patient verbalized understanding of instructions provided today and declined a print copy of patient instruction materials.   The care management team will reach out to the patient again  over the next 3-4 weeks days.   Kieth BrightlyJulie Dattero Analiya Porco, PharmD, BCPS Clinical Pharmacist, [facility]  [facility]  Direct Dial: [phone]

## 2018-10-29 ENCOUNTER — Other Ambulatory Visit: Payer: Self-pay

## 2018-10-29 ENCOUNTER — Ambulatory Visit (HOSPITAL_BASED_OUTPATIENT_CLINIC_OR_DEPARTMENT_OTHER): Payer: Medicare HMO | Attending: Internal Medicine | Admitting: Cardiovascular Disease

## 2018-10-29 ENCOUNTER — Telehealth: Payer: Self-pay | Admitting: Nurse Practitioner

## 2018-10-29 DIAGNOSIS — E669 Obesity, unspecified: Secondary | ICD-10-CM | POA: Insufficient documentation

## 2018-10-29 DIAGNOSIS — I10 Essential (primary) hypertension: Secondary | ICD-10-CM | POA: Insufficient documentation

## 2018-10-29 DIAGNOSIS — E119 Type 2 diabetes mellitus without complications: Secondary | ICD-10-CM | POA: Diagnosis not present

## 2018-10-29 DIAGNOSIS — G4733 Obstructive sleep apnea (adult) (pediatric): Secondary | ICD-10-CM

## 2018-10-29 DIAGNOSIS — R0683 Snoring: Secondary | ICD-10-CM | POA: Diagnosis not present

## 2018-10-29 DIAGNOSIS — R0902 Hypoxemia: Secondary | ICD-10-CM | POA: Diagnosis not present

## 2018-10-29 DIAGNOSIS — Z6831 Body mass index (BMI) 31.0-31.9, adult: Secondary | ICD-10-CM | POA: Insufficient documentation

## 2018-10-29 NOTE — Telephone Encounter (Signed)
I called the patient to schedule an earlier AWV w/ Pamala Hurry, but there was no answer and no option to leave a message. VDM (DD)

## 2018-10-30 ENCOUNTER — Telehealth: Payer: Self-pay

## 2018-10-30 ENCOUNTER — Other Ambulatory Visit: Payer: Self-pay | Admitting: Pharmacy Technician

## 2018-10-30 NOTE — Patient Outreach (Signed)
[facility]) Care Management  10/30/2018  NATASH BERMAN 1950/09/29 [id number]                          Medication Assistance Referral  Referral From: [facility] RPh Jenne Pane (Clinic RPh)  Medication/Company: [name] [employer] Patient application portion:  Mailed Provider application portion: Faxed  to J. [name], FNP    Follow up:  Will follow up with patient in 7-10 business days to confirm application(s) have been received.  Maud Deed Chana Bode [place] Certified Pharmacy Technician [place] [facility] Direct [phone]

## 2018-10-30 NOTE — Telephone Encounter (Signed)
Patient returned your call wanting to know what it was about. YRL,RMA

## 2018-10-31 ENCOUNTER — Ambulatory Visit (HOSPITAL_COMMUNITY)
Admission: RE | Admit: 2018-10-31 | Discharge: 2018-10-31 | Disposition: A | Discharge status: Home / Self Care | Payer: Medicare HMO | Source: Ambulatory Visit | Attending: Interventional Radiology | Admitting: Interventional Radiology

## 2018-10-31 ENCOUNTER — Other Ambulatory Visit: Payer: Self-pay

## 2018-10-31 DIAGNOSIS — I771 Stricture of artery: Secondary | ICD-10-CM

## 2018-10-31 NOTE — Progress Notes (Signed)
Referring Physician(s): [name],Sanjeev  Chief Complaint: The patient is seen in follow up today s/p R MCA CVA  History of present illness:  Donna [name] is a 68 year old female with past medical history of hypertension, hyperlipidemia, TIA, diabetes who was found to have an abnormal CT brain in 06/2018 after a fall at work.  Abnormality was thought to likely represent an acute infarct involving the right lentiform nucleus.  Patient underwent extensive stroke work-up and was started on aspirin and Plavix.  A subsequent MR/MRA of the brain 10/09/2018 revealed severe right-sided MCA M1 segment stenosis. As part of her evaluation she was referred to Dr. Estanislado Pandy for diagnostic angiogram.  This was completed 10/18/2018 and revealed:  Severe high-grade stenosis of approximately 85% of the mid M1 segment of the right middle cerebral artery.  Severe high-grade approximately 90% stenosis of left vertebrobasilar junction, and of 60% just distal to the origin of the anterior-inferior cerebellar artery.  Approximately 50% stenosis at the origin of the right internal carotid artery at the bulb, and also about 60% of the right internal carotid artery at the cervical petrous junction.  Patient presents to [facility] today to discuss results and possible future interventions.  She is not accompanied by any family today.  She denies new symptoms, concerns, or complaints.  Denies headaches, dizziness, vision changes, trouble speaking, trouble swallowing, weakness in extremities, difficulty walking, new falls.  Past Medical History:  Diagnosis Date  . Diabetes mellitus without complication ([place])   . Hyperlipidemia   . Hypertension     Past Surgical History:  Procedure Laterality Date  . IR ANGIO INTRA EXTRACRAN SEL COM CAROTID INNOMINATE BILAT MOD SED  10/18/2018  . IR ANGIO VERTEBRAL SEL VERTEBRAL BILAT MOD SED  10/18/2018  . IR US GUIDE VASC ACCESS RIGHT  10/18/2018  . MYOMECTOMY      Allergies:  Patient has no known allergies.  Medications: Prior to Admission medications   Medication Sig Start Date End Date Taking? Authorizing Provider  amLODipine (NORVASC) 5 MG tablet Take 5 mg by mouth daily.    [provider]  aspirin 325 MG tablet Take 325 mg by mouth daily.    [provider]  blood glucose meter kit and supplies KIT Dispense based on patient and insurance preference. Use up to four times daily as directed. (FOR ICD-9 250.00, 250.01). 05/10/18   Minette Brine, FNP  CALCIUM PO Take by mouth.    [provider]  carvedilol (COREG) 6.25 MG tablet Take 1 tablet (6.25 mg total) by mouth 2 (two) times daily. 09/20/18   Elouise Munroe, MD  Insulin Glargine-Lixisenatide (SOLIQUA) 100-33 UNT-MCG/ML SOPN Inject 20 Units into the skin at bedtime. 10/24/18   Minette Brine, FNP  Multiple Vitamin (MULTIVITAMIN PO) Take by mouth.    [provider]  OVER THE COUNTER MEDICATION Omega Plus    [provider]  telmisartan-hydrochlorothiazide (MICARDIS HCT) 40-12.5 MG tablet Take 1 tablet by mouth once daily 10/14/18   Minette Brine, FNP  VITAMIN D PO Take by mouth.    [provider]     Family History  Problem Relation Age of Onset  . Stroke Mother   . Diabetes Father   . Diabetes Sister   . Breast cancer Neg Hx     Social History   Socioeconomic History  . Marital status: Legally Separated    Spouse name: Not on file  . Number of children: Not on file  . Years of education: Not on file  .  Highest education level: Not on file  Occupational History  . Not on file  Social Needs  . Financial resource strain: Not hard at all  . Food insecurity    Worry: Never true    Inability: Never true  . Transportation needs    Medical: No    Non-medical: No  Tobacco Use  . Smoking status: Never Smoker  . Smokeless tobacco: Never Used  Substance and Sexual Activity  . Alcohol use: No  . Drug use: No  . Sexual activity: Not Currently   Lifestyle  . Physical activity    Days per week: 0 days    Minutes per session: 0 min  . Stress: Not at all  Relationships  . Social Herbalist on phone: Not on file    Gets together: Not on file    Attends religious service: Not on file    Active member of club or organization: Not on file    Attends meetings of clubs or organizations: Not on file    Relationship status: Not on file  Other Topics Concern  . Not on file  Social History Narrative   Lives at home alone   Right handed     Vital Signs: There were no vitals taken for this visit.  Physical Exam Vitals signs and nursing note reviewed.  Constitutional:      Appearance: Normal appearance. She is not ill-appearing.  HENT:     Head: Normocephalic and atraumatic.     Mouth/Throat:     Mouth: Mucous membranes are moist.     Pharynx: Oropharynx is clear.  Eyes:     Extraocular Movements: Extraocular movements intact.     Pupils: Pupils are equal, round, and reactive to light.  Pulmonary:     Effort: Pulmonary effort is normal.  Neurological:     General: No focal deficit present.     Mental Status: She is alert and oriented to person, place, and time. Mental status is at baseline.     Motor: No weakness.     Gait: Gait normal.     Imaging: No results found.  Labs:  CBC: Recent Labs    10/18/18 1034  WBC 5.0  HGB 14.1  HCT 43.6  PLT 254    COAGS: Recent Labs    10/18/18 1034  INR 1.0    BMP: Recent Labs    04/04/18 1536 07/08/18 1417 10/07/18 1259 10/18/18 1034  NA 137 142 136 135  K 4.1 4.6 4.5 4.7  CL 95* 102 94* 97*  CO2 26 24 27 26   GLUCOSE 330* 134* 310* 305*  BUN 23 25 20  25*  CALCIUM 9.7 9.6 10.3  10.5* 9.6  CREATININE 1.00 0.83 1.00 1.00  GFRNONAA 59* 73 58* 58*  GFRAA 68 84 67 >60    LIVER FUNCTION TESTS: Recent Labs    04/04/18 1536 10/07/18 1259  BILITOT 0.2 0.3  AST 17 17  ALT 28 20  ALKPHOS 83 73  PROT 7.2 7.3  ALBUMIN 3.9 4.1    Assessment:  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  She reports improvement in her  symptoms since April of this year.  She denies any residual weakness on her left side at this time.  She reports she is taking medications for hypertension, hyperlipidemia, and diabetes.  She does states she is taking aspirin but does not think she is taking Plavix.  Per documentation from recent visit at Dr. Tula Nakayama office there was a question of compliance with aspirin and Plavix. Dr. Estanislado Pandy met with patient in consultation today and discussed recent angiogram findings.  Discussed her 2 main abnormalities; the right MCA and left vertebrobasilar artery stenosis. Dr. Estanislado Pandy recommended proceeding with intervention at this time.  He suggested he would likely begin with intervention to the right MCA as this is the location of prior stroke and source of her symptoms.  If patient does not wish to undergo intervention at this time then she would need to be followed regularly with surveillance imaging as well as monitoring of her symptoms. Patient verbalizes understanding of information presented today.  She plans to discuss with her family prior to deciding whether or not to move forward with intervention.  Dr. Estanislado Pandy did discuss benefits and risks of the procedure including the risk of stroke, possible hospitalization overnight after procedure, as well as need to have family stay with her at discharge.  Again, patient indicated that she would like to discuss with family prior to proceeding. A prescription for Plavix 75 mg p.o. daily is called  into her pharmacy of choice, [facility] on [address].  Patient understands she is to start taking Plavix daily and to continue taking her aspirin 81 mg daily. Patient will reach out to office when she has made her decision.  Signed: Docia Barrier, PA 10/31/2018, 1:53 PM   Please refer to Dr. Estanislado Pandy attestation of this note for management and plan.

## 2018-11-01 ENCOUNTER — Telehealth: Payer: Self-pay | Admitting: *Deleted

## 2018-11-01 NOTE — Telephone Encounter (Signed)
-----   Message from Buford Dresser, MD sent at 10/31/2018  5:00 PM EDT ----- I cannot tell if these results were returned or not--please see this report and results from carotid studies as well. Echo is largely normal, normal squeeze, mildly stiff. The mitral valve ring has a lot of calcium around it, which by itself doesn't cause an issue. No clear cardiac reason for stroke.

## 2018-11-01 NOTE — Telephone Encounter (Signed)
LEFT MESSAGE TO CALL BACK WITH RESULTS-ECHO , CAROTID

## 2018-11-01 NOTE — Telephone Encounter (Signed)
-----   Message from Buford Dresser, MD sent at 10/31/2018  5:01 PM EDT ----- I cannot tell if these results were returned or not--please see this report and results from echo as well. Carotid studies showed mild to moderate narrowing, not severe. Recommend repeat studies in 1 year.

## 2018-11-04 NOTE — Telephone Encounter (Signed)
LEFT MESSAGE TO CALL BACK - TEST RESULTS 

## 2018-11-06 ENCOUNTER — Telehealth: Payer: Self-pay

## 2018-11-06 NOTE — Telephone Encounter (Signed)
Patient called requesting that I send her last office visit to [facility] at [phone].  I HAVE FAXED THEM OVER. YRL,RMA

## 2018-11-10 ENCOUNTER — Encounter (HOSPITAL_BASED_OUTPATIENT_CLINIC_OR_DEPARTMENT_OTHER): Payer: Self-pay | Admitting: Cardiovascular Disease

## 2018-11-10 NOTE — Procedures (Signed)
Patient Name: Donna [name], Donna [name] Date: 10/29/2018 Gender: Female D.O.B: 1950/08/21 Age (years): 28 Referring Provider: Cherlynn Kaiser MD Height (inches): 61 Interpreting Physician: Shelva Majestic MD, ABSM Weight (lbs): 164 RPSGT: Laren Everts BMI: 31 MRN: [id number] Neck Size: 15.00  CLINICAL INFORMATION Sleep Study Type: NPSG  Indication for sleep study: Diabetes, Fatigue, Hypertension, Obesity, Snoring  Epworth Sleepiness Score: 11  SLEEP STUDY TECHNIQUE As per the AASM Manual for the Scoring of Sleep and Associated Events v2.3 (April 2016) with a hypopnea requiring 4% desaturations.  The channels recorded and monitored were frontal, central and occipital EEG, electrooculogram (EOG), submentalis EMG (chin), nasal and oral airflow, thoracic and abdominal wall motion, anterior tibialis EMG, snore microphone, electrocardiogram, and pulse oximetry.  MEDICATIONS     aspirin 325 MG tablet         blood glucose meter kit and supplies KIT         CALCIUM PO         carvedilol (COREG) 6.25 MG tablet         Insulin Glargine-Lixisenatide (SOLIQUA) 100-33 UNT-MCG/ML SOPN         Multiple Vitamin (MULTIVITAMIN PO)         OVER THE COUNTER MEDICATION         telmisartan-hydrochlorothiazide (MICARDIS HCT) 40-12.5 MG tablet         VITAMIN D PO      Medications self-administered by patient taken the night of the study : AMLODIPINE, CARVEDILOL, Soliqua Insulin  SLEEP ARCHITECTURE The study was initiated at 10:13:19 PM and ended at 4:50:16 AM.  Sleep onset time was 112.8 minutes and the sleep efficiency was 32.0%%. The total sleep time was 127 minutes.  Stage REM latency was 153.0 minutes.  The patient spent 9.1%% of the night in stage N1 sleep, 79.5%% in stage N2 sleep, 0.0%% in stage N3 and 11.4% in REM.  Alpha intrusion was absent.  Supine sleep was 80.31%.  RESPIRATORY PARAMETERS The overall apnea/hypopnea index (AHI) was 9.0 per hour. The respiratory  disturbance index (RDI) was 12.3/h. There were 1 total apneas, including 1 obstructive, 0 central and 0 mixed apneas. There were 18 hypopneas and 7 RERAs.  The AHI during Stage REM sleep was 49.7 per hour.  AHI while supine was 11.2 per hour.  The mean oxygen saturation was 94.7%. The minimum SpO2 during sleep was 80.0%.  Soft snoring was noted during this study.  CARDIAC DATA The 2 lead EKG demonstrated sinus rhythm. The mean heart rate was 62.2 beats per minute. Other EKG findings include: None.  LEG MOVEMENT DATA The total PLMS were 0 with a resulting PLMS index of 0.0. Associated arousal with leg movement index was 0.0 .  IMPRESSIONS - Mild obstructive sleep apnea overall (AHI  9.0/h; RDI 12.3/h); however, sleep apnea was severe during REM sleep (AHI 49.7/h.  - No significant central sleep apnea occurred during this study (CAI = 0.0/h). - Moderate severe oxygen desaturation to a nadir of 80.0%. - The patient snored with soft snoring volume. - Reduced sleep efficiency. - No cardiac abnormalities were noted during this study. - Clinically significant periodic limb movements did not occur during sleep. No significant associated arousals.  DIAGNOSIS - Obstructive Sleep Apnea (327.23 [G47.33 ICD-10]) - Nocturnal Hypoxemia (327.26 [G47.36 ICD-10])  RECOMMENDATIONS - Therapeutic CPAP titration to determine optimal pressure required to alleviate sleep disordered breathing. - Effort should be made to optimize nasal and oropharyngeal patency. - Positional therapy avoiding supine position during sleep. - Avoid  alcohol, sedatives and other CNS depressants that may worsen sleep apnea and disrupt normal sleep architecture. - Sleep hygiene should be reviewed to assess factors that may improve sleep quality. - Weight management (BMI 31) and regular exercise should be initiated or continued if appropriate.  [Electronically signed] 11/10/2018 10:28 PM  Shelva Majestic MD, [facility], ABSM Diplomate,  American Board of Sleep Medicine   NPI: [id number] [place] PH: [phone]   FX: [phone] [place]

## 2018-11-12 ENCOUNTER — Telehealth: Payer: Self-pay | Admitting: *Deleted

## 2018-11-12 NOTE — Telephone Encounter (Signed)
CPAP PA submitted to [employer] via web portal.

## 2018-11-12 NOTE — Telephone Encounter (Signed)
-----   Message from Troy Sine, MD sent at 11/10/2018 10:34 PM EDT ----- Donna [name] please notify the patient the results of her sleep study.  Schedule for in lab CPAP  titration study with subsequent sleep clinic evaluation

## 2018-11-12 NOTE — Telephone Encounter (Signed)
Left message I called to discuss sleep study results and recommendations. Will call back later today or tomorrow due to working from home.did not leave direct office call back #.

## 2018-11-14 ENCOUNTER — Telehealth: Payer: Self-pay | Admitting: *Deleted

## 2018-11-14 ENCOUNTER — Other Ambulatory Visit: Payer: Self-pay | Admitting: Cardiovascular Disease

## 2018-11-14 DIAGNOSIS — G4733 Obstructive sleep apnea (adult) (pediatric): Secondary | ICD-10-CM

## 2018-11-14 NOTE — Telephone Encounter (Signed)
Patient notified of sleep study results and recommendations. She has agrees to CPAP titration study scheduled.

## 2018-11-19 ENCOUNTER — Telehealth: Payer: Self-pay

## 2018-11-19 ENCOUNTER — Other Ambulatory Visit (HOSPITAL_COMMUNITY): Payer: Self-pay | Admitting: Interventional Radiology

## 2018-11-19 DIAGNOSIS — I771 Stricture of artery: Secondary | ICD-10-CM

## 2018-11-19 NOTE — Telephone Encounter (Signed)
Per patient request I have refaxed her last office note over to [place]. YRL,RMA

## 2018-11-22 ENCOUNTER — Telehealth: Payer: Self-pay

## 2018-11-23 ENCOUNTER — Other Ambulatory Visit (HOSPITAL_COMMUNITY): Payer: Medicare HMO

## 2018-11-23 ENCOUNTER — Other Ambulatory Visit (HOSPITAL_COMMUNITY)
Admission: RE | Admit: 2018-11-23 | Discharge: 2018-11-23 | Disposition: A | Discharge status: Home / Self Care | Payer: Medicare HMO | Source: Ambulatory Visit | Attending: Interventional Radiology | Admitting: Interventional Radiology

## 2018-11-23 DIAGNOSIS — Z01812 Encounter for preprocedural laboratory examination: Secondary | ICD-10-CM | POA: Insufficient documentation

## 2018-11-23 DIAGNOSIS — Z20828 Contact with and (suspected) exposure to other viral communicable diseases: Secondary | ICD-10-CM | POA: Diagnosis not present

## 2018-11-24 LAB — NOVEL CORONAVIRUS, NAA (HOSP ORDER, SEND-OUT TO REF LAB; TAT 18-24 HRS): SARS-CoV-2, NAA: NOT DETECTED

## 2018-11-26 ENCOUNTER — Other Ambulatory Visit: Payer: Self-pay | Admitting: Student

## 2018-11-26 ENCOUNTER — Encounter (HOSPITAL_COMMUNITY): Payer: Self-pay | Admitting: Certified Registered Nurse Anesthetist

## 2018-11-26 ENCOUNTER — Encounter (HOSPITAL_BASED_OUTPATIENT_CLINIC_OR_DEPARTMENT_OTHER): Payer: Medicare HMO | Admitting: Cardiovascular Disease

## 2018-11-26 ENCOUNTER — Other Ambulatory Visit: Payer: Self-pay

## 2018-11-26 ENCOUNTER — Telehealth: Payer: Medicare HMO

## 2018-11-26 ENCOUNTER — Other Ambulatory Visit: Payer: Self-pay | Admitting: Radiology

## 2018-11-26 ENCOUNTER — Encounter (HOSPITAL_COMMUNITY): Payer: Self-pay | Admitting: *Deleted

## 2018-11-26 NOTE — Anesthesia Preprocedure Evaluation (Deleted)
Anesthesia Evaluation    Airway        Dental   Pulmonary           Cardiovascular hypertension,      Neuro/Psych    GI/Hepatic   Endo/Other  diabetes  Renal/GU      Musculoskeletal   Abdominal   Peds  Hematology   Anesthesia Other Findings   Reproductive/Obstetrics                             Anesthesia Physical Anesthesia Plan  ASA:   Anesthesia Plan:    Post-op Pain Management:    Induction:   PONV Risk Score and Plan:   Airway Management Planned:   Additional Equipment:   Intra-op Plan:   Post-operative Plan:   Informed Consent:   Plan Discussed with:   Anesthesia Plan Comments: (PAT note written 11/26/2018 by Myra Gianotti, PA-C. )        Anesthesia Quick Evaluation

## 2018-11-26 NOTE — Progress Notes (Signed)
Spoke with pt for pre-op call. Pt denies cardiac history, but states she has had a stroke in the past with some left sided weakness. Pt is on Plavix and Aspirin and states she has been taking them as prescribed and knows to take them tonight. Pt is a type 2 diabetic. Last A1C was 12.3 on 10/07/18. Pt states she has been trying to eat better since this last A1C. States her fasting blood sugar has been around 134. Pt instructed to hold her Glargine-Lixisenatide Insulin this evening (per Diabetes protocol) and to check her blood sugar in the AM when she gets up.  If blood sugar is 70 or below, treat with 1/2 cup of clear juice (apple or cranberry) and recheck blood sugar 15 minutes after drinking juice.  Pt voiced understanding.  Pt had her Covid test done on 11/23/18 and it is negative. Pt states she has been quarantine since having the test done.   Pt informed that she may have one visitor wait in the waiting room while she is in pre-op, surgery and PACU. If she is admitted after surgery, then that one visitor may visit during visiting hours which are  10 AM - 8 PM. Pt voiced understanding.

## 2018-11-26 NOTE — Progress Notes (Signed)
Anesthesia Chart Review: Donna [name]    Case: [id number] Date/Time: 11/27/18 0815   Procedure: IR WITH ANESTHESIA/ANGIOPLASTY WITH POSSIBLE STENTING (N/A )   Anesthesia type: General   Pre-op diagnosis: STENOSIS   Location: [facility] OR RADIOLOGY ROOM / [place] OR   Surgeon: Luanne Bras, MD      DISCUSSION: Patient is a 68 year old female scheduled for cerebral arteriogram with possible right middle cerebral artery angioplasty/stent placement on 11/27/2018 by Luanne Bras, MD.  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  Probable acute CVA noted on head CT after 07/09/18. She has since seen neurology, PCP, and IR. Cardiologist Buford Dresser, MD also reviewed recent echo and carotid studies and did not feel there was a clear cardiac reason for CVA. One year follow-up for carotid disease recommended. Her DM has not been well controlled so Pharmacy and Case Management have been following her for barriers to care and providing on-going education. As of 11/25/18, patient reported that fasting CBGs have been ~ 134. Last A1c result called to [facility] in IR. She will have Dr. Estanislado Pandy review. If plans to proceed as scheduled, then patient will get day of surgery labs including a fasting glucose. If results acceptable and otherwise no acute changes then I would anticipate that she can proceed from an anesthesia standpoint.  She is on ASA and Plavix. 11/23/18 COVID-19 test negative.    PROVIDERS: Minette Brine, FNP is PCP Sarina Ill, MD is neurologist Deitra Mayo, MD is vascular surgeon   LABS: She is for updated labs on arrival. As of 10/18/18 her Cr 1.00, CBC WNL, A1c 12.3 (on 10/07/18, up from 9.1 4 months prior).    IMAGES: Cerebral angiography 10/18/18: IMPRESSION: - Severe high-grade stenosis of  approximately 85% of the mid M1 segment of the right middle cerebral artery. - Severe high-grade approximately 90% stenosis of left vertebrobasilar junction, and of 60% just distal to the origin of the anterior-inferior cerebellar artery. - Approximately 50% stenosis at the origin of the right internal carotid artery at the bulb, and also about 60% of the right internal carotid artery at the cervical petrous junction. PLAN: Consultation to discuss angiographic findings with intention to decide treatment endovascularly of the areas of severe stenosis of the right middle cerebral artery, and the left vertebrobasilar junction and basilar artery.  MRA head 10/09/18: IMPRESSION:  MRA head (without) demonstrating: - Right middle cerebral artery M1 segment has severe stenosis.  Distal M2 and M3 branches appear to have decreased flow signal compared to the left side. - Mild atherosclerosis of left vertebral, basilar arteries and right P1 segment of posterior cerebral artery.   MRI brain 10/09/18: IMPRESSION:  MRI brain (without) demonstrating: - Chronic right lentiform nucleus ischemic infarct, with ex vacuo dilation of right lateral ventricle. - Scattered mild periventricular subcortical chronic small vessel ischemic disease. - No acute findings.   EKG: 09/20/18: Normal sinus rhythm Possible left atrial enlargement Nonspecific T wave abnormality Prolonged QT QT/QTc 398/481 ms   CV: Echo 10/08/18: IMPRESSIONS  1. The left ventricle has hyperdynamic systolic function, with an ejection fraction of >65%. The cavity size was normal. There is moderate asymmetric left ventricular hypertrophy with at least mild concentric hypertrophy. Left ventricular diastolic  Doppler parameters are consistent with impaired relaxation. Elevated left ventricular end-diastolic pressure The E/e' is 55.  2. The right ventricle has normal systolic function. The cavity was normal.  There is no increase in right  ventricular wall thickness.  3. The mitral valve is abnormal. There is moderate to severe bulky mitral annular calcification present.  4. The aortic valve has an indeterminate number of cusps. Aortic valve regurgitation is trivial by color flow Doppler.  5. The aortic root is normal in size and structure.  6. No intracardiac thrombi or masses were visualized.   Carotid US 10/03/18: Summary: - Right Carotid: Velocities in the right ICA are consistent with a 1-39% stenosis.                The RICA velocities remain within normal range and stable                compared to the prior exam. - Left Carotid: Velocities in the left ICA are now consistent with a 40-59%               stenosis, based on peak systolic velocities and plaque formation.               The ECA appears >50% stenosed. The LICA velocities are elevated               and essentially stable compared to the prior exam. The LECA               velocities have increased compared to the prior exam. - Vertebrals:  Bilateral vertebral arteries demonstrate antegrade flow. Small              caliber right vertebral artery. - Subclavians: Normal flow hemodynamics were seen in bilateral subclavian              arteries.   Past Medical History:  Diagnosis Date  . Anemia    low iron - not since having fibroids removed  . Arthritis   . Diabetes mellitus without complication ([place])   . Hyperlipidemia   . Hypertension   . Sleep apnea    just recently diagnosed with it, has not gotten the Cpap (done 10/29/18)  . Stroke [facility])    weakness on left side    Past Surgical History:  Procedure Laterality Date  . COLONOSCOPY    . IR ANGIO INTRA EXTRACRAN SEL COM CAROTID INNOMINATE BILAT MOD SED  10/18/2018  . IR ANGIO VERTEBRAL SEL VERTEBRAL BILAT MOD SED  10/18/2018  . IR US GUIDE VASC ACCESS RIGHT  10/18/2018  . MYOMECTOMY      MEDICATIONS: No current facility-administered medications for this encounter.    Marland Kitchen amLODipine (NORVASC) 10 MG  tablet  . aspirin EC 81 MG tablet  . Calcium Carbonate-Vitamin D (CALCIUM-D PO)  . carvedilol (COREG) 6.25 MG tablet  . clopidogrel (PLAVIX) 75 MG tablet  . Echinacea-Golden Seal Complex CAPS  . Insulin Glargine-Lixisenatide (SOLIQUA) 100-33 UNT-MCG/ML SOPN  . Multiple Vitamin (MULTIVITAMIN PO)  . Omega-3 Fatty Acids (OMEGA-3 PLUS PO)  . telmisartan-hydrochlorothiazide (MICARDIS HCT) 40-12.5 MG tablet  . vitamin C (ASCORBIC ACID) 250 MG tablet  . blood glucose meter kit and supplies KIT     Myra Gianotti, PA-C Surgical Short Stay/Anesthesiology [facility] Phone [phone] [facility] Phone [phone] 11/26/2018 1:05 PM

## 2018-11-27 ENCOUNTER — Ambulatory Visit (HOSPITAL_COMMUNITY)
Admission: RE | Admit: 2018-11-27 | Discharge: 2018-11-27 | Disposition: A | Discharge status: Home / Self Care | Payer: Medicare HMO | Attending: Interventional Radiology | Admitting: Interventional Radiology

## 2018-11-27 ENCOUNTER — Encounter (HOSPITAL_COMMUNITY)
Admission: RE | Disposition: A | Discharge status: Home / Self Care | Payer: Self-pay | Source: Home / Self Care | Attending: Interventional Radiology

## 2018-11-27 ENCOUNTER — Inpatient Hospital Stay (HOSPITAL_COMMUNITY): Admission: RE | Admit: 2018-11-27 | Payer: Medicare HMO | Source: Ambulatory Visit

## 2018-11-27 ENCOUNTER — Encounter (HOSPITAL_COMMUNITY): Payer: Self-pay

## 2018-11-27 ENCOUNTER — Ambulatory Visit: Payer: Medicare HMO | Admitting: Cardiology

## 2018-11-27 DIAGNOSIS — G4730 Sleep apnea, unspecified: Secondary | ICD-10-CM | POA: Insufficient documentation

## 2018-11-27 DIAGNOSIS — E785 Hyperlipidemia, unspecified: Secondary | ICD-10-CM | POA: Diagnosis not present

## 2018-11-27 DIAGNOSIS — Z794 Long term (current) use of insulin: Secondary | ICD-10-CM | POA: Diagnosis not present

## 2018-11-27 DIAGNOSIS — I6521 Occlusion and stenosis of right carotid artery: Secondary | ICD-10-CM | POA: Insufficient documentation

## 2018-11-27 DIAGNOSIS — I10 Essential (primary) hypertension: Secondary | ICD-10-CM | POA: Diagnosis not present

## 2018-11-27 DIAGNOSIS — Z8673 Personal history of transient ischemic attack (TIA), and cerebral infarction without residual deficits: Secondary | ICD-10-CM | POA: Insufficient documentation

## 2018-11-27 DIAGNOSIS — Z7982 Long term (current) use of aspirin: Secondary | ICD-10-CM | POA: Insufficient documentation

## 2018-11-27 DIAGNOSIS — Z5309 Procedure and treatment not carried out because of other contraindication: Secondary | ICD-10-CM | POA: Diagnosis not present

## 2018-11-27 DIAGNOSIS — E119 Type 2 diabetes mellitus without complications: Secondary | ICD-10-CM | POA: Insufficient documentation

## 2018-11-27 DIAGNOSIS — I6601 Occlusion and stenosis of right middle cerebral artery: Secondary | ICD-10-CM | POA: Insufficient documentation

## 2018-11-27 DIAGNOSIS — Z09 Encounter for follow-up examination after completed treatment for conditions other than malignant neoplasm: Secondary | ICD-10-CM | POA: Insufficient documentation

## 2018-11-27 DIAGNOSIS — Z79899 Other long term (current) drug therapy: Secondary | ICD-10-CM | POA: Insufficient documentation

## 2018-11-27 DIAGNOSIS — Z7902 Long term (current) use of antithrombotics/antiplatelets: Secondary | ICD-10-CM | POA: Diagnosis not present

## 2018-11-27 DIAGNOSIS — I6782 Cerebral ischemia: Secondary | ICD-10-CM | POA: Diagnosis not present

## 2018-11-27 HISTORY — DX: Sleep apnea, unspecified: G47.30

## 2018-11-27 HISTORY — DX: Cerebral infarction, unspecified: I63.9

## 2018-11-27 HISTORY — DX: Unspecified osteoarthritis, unspecified site: M19.90

## 2018-11-27 HISTORY — DX: Anemia, unspecified: D64.9

## 2018-11-27 LAB — CBC WITH DIFFERENTIAL/PLATELET
Abs Immature Granulocytes: 0.02 10*3/uL (ref 0.00–0.07)
Basophils Absolute: 0 10*3/uL (ref 0.0–0.1)
Basophils Relative: 1 %
Eosinophils Absolute: 0.1 10*3/uL (ref 0.0–0.5)
Eosinophils Relative: 1 %
HCT: 41.9 % (ref 36.0–46.0)
Hemoglobin: 13.7 g/dL (ref 12.0–15.0)
Immature Granulocytes: 0 %
Lymphocytes Relative: 25 %
Lymphs Abs: 1.3 10*3/uL (ref 0.7–4.0)
MCH: 29.6 pg (ref 26.0–34.0)
MCHC: 32.7 g/dL (ref 30.0–36.0)
MCV: 90.5 fL (ref 80.0–100.0)
Monocytes Absolute: 0.4 10*3/uL (ref 0.1–1.0)
Monocytes Relative: 8 %
Neutro Abs: 3.6 10*3/uL (ref 1.7–7.7)
Neutrophils Relative %: 65 %
Platelets: 239 10*3/uL (ref 150–400)
RBC: 4.63 MIL/uL (ref 3.87–5.11)
RDW: 12.5 % (ref 11.5–15.5)
WBC: 5.5 10*3/uL (ref 4.0–10.5)
nRBC: 0 % (ref 0.0–0.2)

## 2018-11-27 LAB — URINALYSIS, COMPLETE (UACMP) WITH MICROSCOPIC
Bacteria, UA: NONE SEEN
Bilirubin Urine: NEGATIVE
Glucose, UA: >=500 mg/dL — AB
Hgb urine dipstick: NEGATIVE
Ketones, ur: NEGATIVE mg/dL
Leukocytes,Ua: NEGATIVE
Nitrite: NEGATIVE
Protein, ur: 100 mg/dL — AB
Specific Gravity, Urine: 1.021 (ref 1.005–1.030)
pH: 5 (ref 5.0–8.0)

## 2018-11-27 LAB — BASIC METABOLIC PANEL
Anion gap: 13 (ref 5–15)
BUN: 27 mg/dL — ABNORMAL HIGH (ref 8–23)
CO2: 23 mmol/L (ref 22–32)
Calcium: 9.5 mg/dL (ref 8.9–10.3)
Chloride: 102 mmol/L (ref 98–111)
Creatinine, Ser: 0.91 mg/dL (ref 0.44–1.00)
GFR calc Af Amer: >60 mL/min (ref >60)
GFR calc non Af Amer: >60 mL/min (ref >60)
Glucose, Bld: 275 mg/dL — ABNORMAL HIGH (ref 70–99)
Potassium: 3.8 mmol/L (ref 3.5–5.1)
Sodium: 138 mmol/L (ref 135–145)

## 2018-11-27 LAB — PROTIME-INR
INR: 0.9 (ref 0.8–1.2)
Prothrombin Time: 12.2 seconds (ref 11.4–15.2)

## 2018-11-27 LAB — PLATELET INHIBITION P2Y12: Platelet Function  P2Y12: 194 [PRU] (ref 182–335)

## 2018-11-27 LAB — GLUCOSE, CAPILLARY: Glucose-Capillary: 266 mg/dL — ABNORMAL HIGH (ref 70–99)

## 2018-11-27 SURGERY — IR WITH ANESTHESIA
Anesthesia: General

## 2018-11-27 MED ORDER — CEFAZOLIN SODIUM-DEXTROSE 2-4 GM/100ML-% IV SOLN
2.0000 g | INTRAVENOUS | Status: DC
  Filled 2018-11-27: qty 100

## 2018-11-27 MED ORDER — ASPIRIN EC 325 MG PO TBEC
325.0000 mg | DELAYED_RELEASE_TABLET | ORAL | Status: AC
  Administered 2018-11-27: 325 mg via ORAL
  Filled 2018-11-27: qty 1

## 2018-11-27 MED ORDER — CLOPIDOGREL BISULFATE 75 MG PO TABS
75.0000 mg | ORAL_TABLET | ORAL | Status: AC
  Administered 2018-11-27: 75 mg via ORAL
  Filled 2018-11-27: qty 1

## 2018-11-27 MED ORDER — SODIUM CHLORIDE 0.9 % IV SOLN: INTRAVENOUS | Status: DC

## 2018-11-27 MED ORDER — NIMODIPINE 30 MG PO CAPS: 0.0000 mg | ORAL_CAPSULE | ORAL | Status: DC

## 2018-11-27 NOTE — Progress Notes (Signed)
Per Dr. Estanislado Pandy,  Surgery cancelled, due to abnormal P2Y12 lab.  No IVs were started.   Dr. Estanislado Pandy informed of pt's high BP & CBG.   Pt instructed to take insulin and Carvedilol when she gets home, per Dr. Estanislado Pandy.  Dr. Estanislado Pandy following up with patient's family.  Pt discharged home with sister.

## 2018-11-27 NOTE — H&P (Signed)
Referring Physician(s): [name],Sanjeev  Chief Complaint: The patient is seen in follow up today s/p R MCA CVA  History of present illness:  Donna [name] is a 68 year old female with past medical history of hypertension, hyperlipidemia, TIA, diabetes who was found to have an abnormal CT brain in 06/2018 after a fall at work.  Abnormality was thought to likely represent an acute infarct involving the right lentiform nucleus.  Patient underwent extensive stroke work-up and was started on aspirin and Plavix.  A subsequent MR/MRA of the brain 10/09/2018 revealed severe right-sided MCA M1 segment stenosis. As part of her evaluation she was referred to Dr. Estanislado Pandy for diagnostic angiogram.  This was completed 10/18/2018 and revealed:  Severe high-grade stenosis of approximately 85% of the mid M1 segment of the right middle cerebral artery.  Severe high-grade approximately 90% stenosis of left vertebrobasilar junction, and of 60% just distal to the origin of the anterior-inferior cerebellar artery.  Approximately 50% stenosis at the origin of the right internal carotid artery at the bulb, and also about 60% of the right internal carotid artery at the cervical petrous junction.  After follow up consultation, she is now scheduled for planned angioplasty/sent of the (R)MCA stenosis.  PMHx, meds, labs, imaging, allergies reviewed. She did not take her ASA or Plavix yet this am. Feels well, no recent fevers, chills, illness. Has been NPO today as directed.    Past Medical History:  Diagnosis Date  . Anemia    low iron - not since having fibroids removed  . Arthritis   . Diabetes mellitus without complication ([place])   . Hyperlipidemia   . Hypertension   . Sleep apnea    just recently diagnosed with it, has not gotten the Cpap (done 10/29/18)  . Stroke [facility])    weakness on left side    Past Surgical History:  Procedure Laterality Date  . COLONOSCOPY    . IR ANGIO INTRA EXTRACRAN  SEL COM CAROTID INNOMINATE BILAT MOD SED  10/18/2018  . IR ANGIO VERTEBRAL SEL VERTEBRAL BILAT MOD SED  10/18/2018  . IR US GUIDE VASC ACCESS RIGHT  10/18/2018  . MYOMECTOMY      Allergies: Patient has no known allergies.  Medications: Prior to Admission medications   Medication Sig Start Date End Date Taking? Authorizing Provider  amLODipine (NORVASC) 5 MG tablet Take 5 mg by mouth daily.    [provider]  aspirin 325 MG tablet Take 325 mg by mouth daily.    [provider]  blood glucose meter kit and supplies KIT Dispense based on patient and insurance preference. Use up to four times daily as directed. (FOR ICD-9 250.00, 250.01). 05/10/18   Minette Brine, FNP  CALCIUM PO Take by mouth.    [provider]  carvedilol (COREG) 6.25 MG tablet Take 1 tablet (6.25 mg total) by mouth 2 (two) times daily. 09/20/18   Elouise Munroe, MD  Insulin Glargine-Lixisenatide (SOLIQUA) 100-33 UNT-MCG/ML SOPN Inject 20 Units into the skin at bedtime. 10/24/18   Minette Brine, FNP  Multiple Vitamin (MULTIVITAMIN PO) Take by mouth.    [provider]  OVER THE COUNTER MEDICATION Omega Plus    [provider]  telmisartan-hydrochlorothiazide (MICARDIS HCT) 40-12.5 MG tablet Take 1 tablet by mouth once daily 10/14/18   Minette Brine, FNP  VITAMIN D PO Take by mouth.    [provider]     Family History  Problem Relation Age of Onset  . Stroke Mother   .  Diabetes Father   . Diabetes Sister   . Breast cancer Neg Hx     Social History   Socioeconomic History  . Marital status: Legally Separated    Spouse name: Not on file  . Number of children: Not on file  . Years of education: Not on file  . Highest education level: Not on file  Occupational History  . Not on file  Social Needs  . Financial resource strain: Not hard at all  . Food insecurity    Worry: Never true    Inability: Never true  . Transportation needs    Medical: No     Non-medical: No  Tobacco Use  . Smoking status: Never Smoker  . Smokeless tobacco: Never Used  Substance and Sexual Activity  . Alcohol use: No  . Drug use: No  . Sexual activity: Not Currently  Lifestyle  . Physical activity    Days per week: 0 days    Minutes per session: 0 min  . Stress: Not at all  Relationships  . Social Herbalist on phone: Not on file    Gets together: Not on file    Attends religious service: Not on file    Active member of club or organization: Not on file    Attends meetings of clubs or organizations: Not on file    Relationship status: Not on file  Other Topics Concern  . Not on file  Social History Narrative   Lives at home alone   Right handed     Vital Signs: BP (!) 198/65   Pulse 65   Temp 97.9 F (36.6 C) (Oral)   Resp 18   Ht 5' 1"  (1.549 m)   Wt 74.4 kg   SpO2 100%   BMI 30.99 kg/m   Physical Exam Vitals signs and nursing note reviewed.  Constitutional:      Appearance: Normal appearance. She is not ill-appearing.  HENT:     Head: Normocephalic and atraumatic.     Mouth/Throat:     Mouth: Mucous membranes are moist.     Pharynx: Oropharynx is clear.  Eyes:     Extraocular Movements: Extraocular movements intact.     Pupils: Pupils are equal, round, and reactive to light.  Pulmonary:     Effort: Pulmonary effort is normal.  Neurological:     General: No focal deficit present.     Mental Status: She is alert and oriented to person, place, and time. Mental status is at baseline.     Motor: No weakness.     Gait: Gait normal.     Labs:  CBC: Recent Labs    10/18/18 1034 11/27/18 0728  WBC 5.0 5.5  HGB 14.1 13.7  HCT 43.6 41.9  PLT 254 239    COAGS: Recent Labs    10/18/18 1034 11/27/18 0728  INR 1.0 0.9    BMP: Recent Labs    04/04/18 1536 07/08/18 1417 10/07/18 1259 10/18/18 1034  NA 137 142 136 135  K 4.1 4.6 4.5 4.7  CL 95* 102 94* 97*  CO2 26 24 27 26   GLUCOSE 330* 134* 310*  305*  BUN 23 25 20  25*  CALCIUM 9.7 9.6 10.3  10.5* 9.6  CREATININE 1.00 0.83 1.00 1.00  GFRNONAA 59* 73 58* 58*  GFRAA 68 84 67 >60    LIVER FUNCTION TESTS: Recent Labs    04/04/18 1536 10/07/18 1259  BILITOT 0.2 0.3  AST 17 17  ALT 28 20  ALKPHOS 83 73  PROT 7.2 7.3  ALBUMIN 3.9 4.1    Assessment: Severe high-grade stenosis of the mid M1 segment of the right middle cerebral artery History of CVA 06/2018 Plan to proceed with intervention to the right MCA as this is the location of prior stroke and source of her symptoms. Labs pending. Risks and benefits of cerebral angio with stent angioplasty were discussed with the patient including, but not limited to bleeding, infection, vascular injury,  contrast induced renal failure, stroke or death.  This interventional procedure involves the use of X-rays and because of the nature of the planned procedure, it is possible that we will have prolonged use of X-ray fluoroscopy.  Potential radiation risks to you include (but are not limited to) the following: - A slightly elevated risk for cancer  several years later in life. This risk is typically less than 0.5% percent. This risk is low in comparison to the normal incidence of human cancer, which is 33% for women and 50% for men according to the [employer]. - Radiation induced injury can include skin redness, resembling a rash, tissue breakdown / ulcers and hair loss (which can be temporary or permanent).   The likelihood of either of these occurring depends on the difficulty of the procedure and whether you are sensitive to radiation due to previous procedures, disease, or genetic conditions.   IF your procedure requires a prolonged use of radiation, you will be notified and given written instructions for further action.  It is your responsibility to monitor the irradiated area for the 2 weeks following the procedure and to notify your physician if you are concerned that  you have suffered a radiation induced injury.    All of the patient's questions were answered, patient is agreeable to proceed.  Consent signed and in chart.    Signed: Ascencion Dike, PA-C 11/27/2018, 7:56 AM

## 2018-11-27 NOTE — Progress Notes (Signed)
NIR.  Patient was scheduled for an image-guided cerebral arteriogram with intent to treat right MCA M1 segment stenosis today with Dr. Estanislado Pandy.  Saw and evaluated patient alongside Dr. Estanislado Pandy. Patient awake and alert laying in bed with no complaints. She is grossly asymptomatic at this time- denies headache, weakness, numbness/tingling, dizziness, vision changes, hearing changes, tinnitus, or speech difficulty.  P2Y12 this AM- 194 PRU. Discussed case with Dr. Estanislado Pandy who recommends procedure postpone until P2Y12 within acceptable limits. Patient discontinue Plavix use, begin taking Brilinta 90 mg twice daily, and continue taking Aspirin 81 mg once daily. Patient aware to begin medication changes tomorrow 11/28/2018. Coupon card and samples of Brilinta given to patient. Schedulers to call patient to reschedule procedure. Patient ready for discharge- patient to take home BP/diabetic medications today once she gets home. Discussed above with patient and patient's sister. All questions answered and concerns addressed. Patient and her sister convey understanding and agree with plan.  [facility] [phone]) at [phone] and left VM to fill prescription- Brilinta 90 mg tablets, take one tablet by mouth twice daily, dispense 60 tablets with 3 refills.   Donna Graff Judythe Postema, PA-C 11/27/2018, 9:33 AM

## 2018-11-28 ENCOUNTER — Other Ambulatory Visit: Payer: Self-pay

## 2018-11-28 ENCOUNTER — Encounter: Payer: Self-pay | Admitting: Nurse Practitioner

## 2018-11-28 ENCOUNTER — Ambulatory Visit (INDEPENDENT_AMBULATORY_CARE_PROVIDER_SITE_OTHER): Payer: Medicare HMO | Admitting: Nurse Practitioner

## 2018-11-28 ENCOUNTER — Ambulatory Visit: Payer: Medicare HMO | Admitting: Nurse Practitioner

## 2018-11-28 VITALS — BP 190/84 | HR 77 | Temp 98.0°F | Ht 61.0 in | Wt 161.6 lb

## 2018-11-28 DIAGNOSIS — I10 Essential (primary) hypertension: Secondary | ICD-10-CM | POA: Diagnosis not present

## 2018-11-28 DIAGNOSIS — Z23 Encounter for immunization: Secondary | ICD-10-CM | POA: Diagnosis not present

## 2018-11-28 DIAGNOSIS — I669 Occlusion and stenosis of unspecified cerebral artery: Secondary | ICD-10-CM

## 2018-11-28 DIAGNOSIS — I639 Cerebral infarction, unspecified: Secondary | ICD-10-CM

## 2018-11-28 DIAGNOSIS — E11319 Type 2 diabetes mellitus with unspecified diabetic retinopathy without macular edema: Secondary | ICD-10-CM

## 2018-11-28 LAB — HEMOGLOBIN A1C
Hgb A1c MFr Bld: 11.3 % — ABNORMAL HIGH (ref 4.8–5.6)
Mean Plasma Glucose: 278 mg/dL

## 2018-11-28 NOTE — Patient Instructions (Addendum)
-   Increase your Soliqua to 25 units daily.   - go to [employer] [employer] on [address] or [address] for a blood pressure cuff

## 2018-11-28 NOTE — Progress Notes (Signed)
Subjective:     Patient ID: Donna [name] , female    DOB: [birth year] , 68 y.o.   MRN: [id number]   Chief Complaint  Patient presents with  . Hypertension  . Diabetes    HPI  She was recently started on Brilinta and stopped on plavix and ASA.  Felt the plavix was not working so this is why she started the brilinta.    She is here today also due to her blood pressure being elevated during her procedure however it is now on hold.  She did not take her blood pressure medication prior to the procedure and her blood pressure has not normalized since that time  Hypertension This is a chronic problem. The current episode started more than 1 year ago. The problem has been gradually worsening since onset. The problem is uncontrolled. Pertinent negatives include no anxiety, blurred vision, chest pain, headaches or palpitations. There are no associated agents to hypertension. Risk factors for coronary artery disease include obesity. Past treatments include angiotensin blockers, calcium channel blockers and diuretics. The current treatment provides mild improvement. There are no compliance problems.  There is no history of angina. There is no history of chronic renal disease.  Diabetes She presents for her follow-up diabetic visit. She has type 2 diabetes mellitus. Her disease course has been worsening. Pertinent negatives for hypoglycemia include no dizziness, headaches or speech difficulty. Pertinent negatives for diabetes include no blurred vision, no chest pain, no fatigue, no polydipsia, no polyphagia, no polyuria and no weight loss. There are no hypoglycemic complications. There are no diabetic complications. Risk factors for coronary artery disease include obesity, sedentary lifestyle, hypertension, diabetes mellitus and dyslipidemia. Current diabetic treatment includes oral agent (dual therapy). She is compliant with treatment most of the time. She is following a generally unhealthy diet. She has  not had a previous visit with a dietitian. She rarely participates in exercise. (She is not checking her blood sugars regularly) An ACE inhibitor/angiotensin II receptor blocker is being taken.     Past Medical History:  Diagnosis Date  . Anemia    low iron - not since having fibroids removed  . Arthritis   . Diabetes mellitus without complication ([place])   . Hyperlipidemia   . Hypertension   . Sleep apnea    just recently diagnosed with it, has not gotten the Cpap (done 10/29/18)  . Stroke [facility])    weakness on left side     Family History  Problem Relation Age of Onset  . Stroke Mother   . Diabetes Father   . Diabetes Sister   . Breast cancer Neg Hx      Current Outpatient Medications:  .  amLODipine (NORVASC) 10 MG tablet, Take 10 mg by mouth every evening., Disp: , Rfl:  .  aspirin EC 81 MG tablet, Take 81 mg by mouth every evening., Disp: , Rfl:  .  blood glucose meter kit and supplies KIT, Dispense based on patient and insurance preference. Use up to four times daily as directed. (FOR ICD-9 250.00, 250.01)., Disp: 1 each, Rfl: 0 .  Calcium Carbonate-Vitamin D (CALCIUM-D PO), Take 3 tablets by mouth daily at 12 noon., Disp: , Rfl:  .  carvedilol (COREG) 6.25 MG tablet, Take 1 tablet (6.25 mg total) by mouth 2 (two) times daily., Disp: 180 tablet, Rfl: 3 .  Echinacea-Golden Seal Complex CAPS, Take 3 capsules by mouth daily at 12 noon., Disp: , Rfl:  .  Insulin Glargine-Lixisenatide (SOLIQUA) 100-33 UNT-MCG/ML  SOPN, Inject 20 Units into the skin at bedtime., Disp: 15 pen, Rfl: 0 .  Multiple Vitamin (MULTIVITAMIN PO), Take 1 tablet by mouth daily at 12 noon. , Disp: , Rfl:  .  Omega-3 Fatty Acids (OMEGA-3 PLUS PO), Take 2 capsules by mouth daily at 12 noon., Disp: , Rfl:  .  telmisartan-hydrochlorothiazide (MICARDIS HCT) 40-12.5 MG tablet, Take 1 tablet by mouth once daily (Patient taking differently: Take 1 tablet by mouth daily. ), Disp: 90 tablet, Rfl: 0 .  vitamin C (ASCORBIC  ACID) 250 MG tablet, Take 500 mg by mouth daily at 12 noon., Disp: , Rfl:  .  clopidogrel (PLAVIX) 75 MG tablet, Take 75 mg by mouth every evening., Disp: , Rfl:  .  ticagrelor (BRILINTA) 90 MG TABS tablet, Take 90 mg by mouth 2 (two) times daily. Patient has not picked it up yet, Disp: , Rfl:    No Known Allergies   Review of Systems  Constitutional: Negative for fatigue and weight loss.  Eyes: Negative for blurred vision.  Respiratory: Negative.   Cardiovascular: Negative for chest pain, palpitations and leg swelling.  Endocrine: Negative for polydipsia, polyphagia and polyuria.  Neurological: Negative for dizziness, speech difficulty and headaches.     Today's Vitals   11/28/18 1633  BP: (!) 190/102  Pulse: 77  Temp: 98 F (36.7 C)  TempSrc: Oral  Weight: 161 lb 9.6 oz (73.3 kg)  Height: '5\' 1"'$  (1.549 m)  PainSc: 0-No pain   Body mass index is 30.53 kg/m.   Objective:  Physical Exam Constitutional:      Appearance: Normal appearance.  Cardiovascular:     Rate and Rhythm: Normal rate and regular rhythm.     Pulses: Normal pulses.     Heart sounds: Normal heart sounds. No murmur.  Pulmonary:     Effort: Pulmonary effort is normal. No respiratory distress.     Breath sounds: Normal breath sounds.  Skin:    General: Skin is warm and dry.     Capillary Refill: Capillary refill takes less than 2 seconds.  Neurological:     General: No focal deficit present.     Mental Status: She is alert.         Assessment And Plan:  1. Diabetic retinopathy without macular edema associated with type 2 diabetes mellitus, unspecified laterality, unspecified retinopathy severity ([place])  She continues to be poorly controlled with her HgbA1c on Soliqua, I have advised her to increase to 25 units. I will see if she can get approved for a different diabetes medication that may be more effective  Diabetic foot exam done with decreased sensation, she has thickened skin to her plantar  surfaces  2. Essential hypertension  Poorly controlled hypertension, she held her blood pressure medications yesterday due to having a procedure for her cerebral stenosis but was unable to have done due to plavix not effective, she is now on Brillinta  I have advised her to take her medications when she gets home  Amlodipine 10 mg is added to her regimen  3. Need for influenza vaccination  Influenza vaccine given in office  Advised to take Tylenol as needed for muscle aches or fever - Flu vaccine HIGH DOSE PF (Fluzone High dose)  4. Right-sided cerebrovascular accident (CVA) [facility])  She is being treated by Neurology and interventional radiologist to attempt to open her cerebral vessel         Minette Brine, FNP    THE PATIENT IS ENCOURAGED TO PRACTICE  SOCIAL DISTANCING DUE TO THE COVID-19 PANDEMIC.

## 2018-11-29 ENCOUNTER — Emergency Department (HOSPITAL_COMMUNITY)
Admission: EM | Admit: 2018-11-29 | Discharge: 2018-11-30 | Disposition: A | Discharge status: Home / Self Care | Payer: Medicare HMO | Attending: Emergency Medicine | Admitting: Emergency Medicine

## 2018-11-29 ENCOUNTER — Encounter (HOSPITAL_COMMUNITY): Payer: Self-pay | Admitting: Emergency Medicine

## 2018-11-29 ENCOUNTER — Other Ambulatory Visit: Payer: Self-pay

## 2018-11-29 ENCOUNTER — Emergency Department (HOSPITAL_COMMUNITY): Payer: Medicare HMO

## 2018-11-29 DIAGNOSIS — R51 Headache: Secondary | ICD-10-CM | POA: Diagnosis not present

## 2018-11-29 DIAGNOSIS — R519 Headache, unspecified: Secondary | ICD-10-CM

## 2018-11-29 DIAGNOSIS — R000 Tachycardia, unspecified: Secondary | ICD-10-CM | POA: Diagnosis not present

## 2018-11-29 DIAGNOSIS — E119 Type 2 diabetes mellitus without complications: Secondary | ICD-10-CM | POA: Diagnosis not present

## 2018-11-29 DIAGNOSIS — I10 Essential (primary) hypertension: Secondary | ICD-10-CM | POA: Insufficient documentation

## 2018-11-29 DIAGNOSIS — R002 Palpitations: Secondary | ICD-10-CM

## 2018-11-29 LAB — CBC
HCT: 42.2 % (ref 36.0–46.0)
Hemoglobin: 13.5 g/dL (ref 12.0–15.0)
MCH: 28.7 pg (ref 26.0–34.0)
MCHC: 32 g/dL (ref 30.0–36.0)
MCV: 89.8 fL (ref 80.0–100.0)
Platelets: 290 10*3/uL (ref 150–400)
RBC: 4.7 MIL/uL (ref 3.87–5.11)
RDW: 12.6 % (ref 11.5–15.5)
WBC: 4.8 10*3/uL (ref 4.0–10.5)
nRBC: 0 % (ref 0.0–0.2)

## 2018-11-29 LAB — BASIC METABOLIC PANEL
Anion gap: 11 (ref 5–15)
BUN: 18 mg/dL (ref 8–23)
CO2: 25 mmol/L (ref 22–32)
Calcium: 9.8 mg/dL (ref 8.9–10.3)
Chloride: 100 mmol/L (ref 98–111)
Creatinine, Ser: 0.98 mg/dL (ref 0.44–1.00)
GFR calc Af Amer: >60 mL/min (ref >60)
GFR calc non Af Amer: 60 mL/min — ABNORMAL LOW (ref >60)
Glucose, Bld: 237 mg/dL — ABNORMAL HIGH (ref 70–99)
Potassium: 3.8 mmol/L (ref 3.5–5.1)
Sodium: 136 mmol/L (ref 135–145)

## 2018-11-29 LAB — TROPONIN I (HIGH SENSITIVITY): Troponin I (High Sensitivity): 8 ng/L (ref <18)

## 2018-11-29 MED ORDER — SODIUM CHLORIDE 0.9% FLUSH: 3.0000 mL | Freq: Once | INTRAVENOUS | Status: DC

## 2018-11-29 NOTE — ED Notes (Signed)
Patient son, please call when patient is discharged.  Laurian Brim [phone]

## 2018-11-29 NOTE — ED Triage Notes (Addendum)
Reports frontal headache that started this morning and lasted until around 5pm.  States her heart has been racing all day and she just doesn't feel right.  Denies pain at present.  No arm drift or other neuro deficits noted. Denies SOB.

## 2018-11-29 NOTE — ED Notes (Signed)
Updated on wait for treatment room. 

## 2018-11-30 NOTE — ED Provider Notes (Addendum)
[facility] Emergency Department Provider Note MRN:  [id number]  Arrival date & time: 11/30/18     Chief Complaint   Tachycardia   History of Present Illness   Donna [name] is a 68 y.o. year-old female with a history of hypertension, hyperlipidemia, diabetes presenting to the ED with chief complaint of tachycardia.  Patient felt like her heart was racing yesterday.  Also had a gradual onset dull frontal headache.  Came to the emergency department for evaluation.  Patient waited in the waiting room for 12 hours.  She feels much better today.  Denies headache, no chest pain today, did not experience any chest pain yesterday either.  No shortness of breath, no leg pain or swelling, no numbness or weakness to the arms or legs.  No trauma.  Review of Systems  A complete 10 system review of systems was obtained and all systems are negative except as noted in the HPI and PMH.   Patient's Health History    Past Medical History:  Diagnosis Date  . Anemia    low iron - not since having fibroids removed  . Arthritis   . Diabetes mellitus without complication ([place])   . Hyperlipidemia   . Hypertension   . Sleep apnea    just recently diagnosed with it, has not gotten the Cpap (done 10/29/18)  . Stroke [facility]    weakness on left side    Past Surgical History:  Procedure Laterality Date  . COLONOSCOPY    . IR ANGIO INTRA EXTRACRAN SEL COM CAROTID INNOMINATE BILAT MOD SED  10/18/2018  . IR ANGIO VERTEBRAL SEL VERTEBRAL BILAT MOD SED  10/18/2018  . IR US GUIDE VASC ACCESS RIGHT  10/18/2018  . MYOMECTOMY      Family History  Problem Relation Age of Onset  . Stroke Mother   . Diabetes Father   . Diabetes Sister   . Breast cancer Neg Hx     Social History   Socioeconomic History  . Marital status: Legally Separated    Spouse name: Not on file  . Number of children: Not on file  . Years of education: Not on file  . Highest education level: Not on file   Occupational History  . Not on file  Social Needs  . Financial resource strain: Not hard at all  . Food insecurity    Worry: Never true    Inability: Never true  . Transportation needs    Medical: No    Non-medical: No  Tobacco Use  . Smoking status: Never Smoker  . Smokeless tobacco: Never Used  Substance and Sexual Activity  . Alcohol use: No  . Drug use: No  . Sexual activity: Not Currently  Lifestyle  . Physical activity    Days per week: 0 days    Minutes per session: 0 min  . Stress: Not at all  Relationships  . Social Herbalist on phone: Not on file    Gets together: Not on file    Attends religious service: Not on file    Active member of club or organization: Not on file    Attends meetings of clubs or organizations: Not on file    Relationship status: Not on file  . Intimate partner violence    Fear of current or ex partner: No    Emotionally abused: No    Physically abused: No    Forced sexual activity: No  Other Topics Concern  . Not  on file  Social History Narrative   Lives at home alone   Right handed     Physical Exam  Vital Signs and Nursing Notes reviewed Vitals:   11/30/18 0343 11/30/18 0740  BP: (!) 158/71 (!) 182/85  Pulse: 64 69  Resp: 16 16  Temp:  97.7 F (36.5 C)  SpO2: 99% 99%    CONSTITUTIONAL: Well-appearing, NAD NEURO:  Alert and oriented x 3, normal and symmetric strength and sensation, normal coordination, normal gait, normal speech. EYES:  eyes equal and reactive ENT/NECK:  no LAD, no JVD CARDIO: Regular rate, well-perfused, normal S1 and S2 PULM:  CTAB no wheezing or rhonchi GI/GU:  normal bowel sounds, non-distended, non-tender MSK/SPINE:  No gross deformities, no edema SKIN:  no rash, atraumatic PSYCH:  Appropriate speech and behavior  Diagnostic and Interventional Summary    EKG Interpretation  Date/Time:    Ventricular Rate:    PR Interval:    QRS Duration:   QT Interval:    QTC Calculation:   R  Axis:     Text Interpretation:        Labs Reviewed  BASIC METABOLIC PANEL - Abnormal; Notable for the following components:      Result Value   Glucose, Bld 237 (*)    GFR calc non Af Amer 60 (*)    All other components within normal limits  CBC  TROPONIN I (HIGH SENSITIVITY)  TROPONIN I (HIGH SENSITIVITY)  TROPONIN I (HIGH SENSITIVITY)    DG Chest 2 View  Final Result      Medications  sodium chloride flush (NS) 0.9 % injection 3 mL (has no administration in time range)     Procedures Critical Care  ED Course and Medical Decision Making  I have reviewed the triage vital signs and the nursing notes.  Pertinent labs & imaging results that were available during my care of the patient were reviewed by me and considered in my medical decision making (see below for details).  Resolved headache and palpitations from yesterday, reassuring laboratory assessment.  Reassuring neurological exam.  Patient is requesting discharge.  Strict return precautions, PCP follow-up.  I was able to speak with patient's son who confirmed that there is no other concerns or symptoms.  9:30 AM update: Attempted to obtain screening EKG prior to patient's departure to ensure sinus rhythm and exclude any ischemic features.  Patient had left the emergency department prior to formal discharge or receiving of her discharge instructions.  Elmer SowMichael M. Pilar PlateBero, MD [facility] [email or web address]  Final Clinical Impressions(s) / ED Diagnoses     ICD-10-CM   1. Palpitations  R00.2   2. Nonintractable headache, unspecified chronicity pattern, unspecified headache type  R51     ED Discharge Orders    None      Discharge Instructions Discussed with and Provided to Patient:   Discharge Instructions     You were evaluated in the Emergency Department and after careful evaluation, we did not find any emergent condition requiring admission or further testing in  the hospital.  Please return to the Emergency Department if you experience any worsening of your condition.  We encourage you to follow up with a primary care provider.  Thank you for allowing us to be a part of your care.       Sabas SousBero, Marnisha Stampley M, MD 11/30/18 [id number]    Sabas SousBero, Hermilo Dutter M, MD 11/30/18 [id number]    Sabas SousBero, Wilhemenia Camba M, MD 11/30/18 [phone]

## 2018-11-30 NOTE — ED Notes (Signed)
Dr Bero at bedside.  

## 2018-11-30 NOTE — Discharge Instructions (Addendum)
You were evaluated in the Emergency Department and after careful evaluation, we did not find any emergent condition requiring admission or further testing in the hospital. ° °Please return to the Emergency Department if you experience any worsening of your condition.  We encourage you to follow up with a primary care provider.  Thank you for allowing us to be a part of your care. °

## 2018-12-02 ENCOUNTER — Telehealth: Payer: Self-pay

## 2018-12-02 NOTE — Telephone Encounter (Signed)
Patient called stating she went to the ER on Friday because her bp was high. She wanted to know If you needed to change her bp med.  I RETURNED PT CALL AND LEFT HER A V/M WE WANTED TO KNOW IF SHE HAS CHECKED HER BP AT HOME AND IF SHE IS STILL TAKING HER MEDICATIONS DAILY. YRL,RMA

## 2018-12-03 NOTE — Progress Notes (Signed)
This encounter was created in error - please disregard.

## 2018-12-04 ENCOUNTER — Other Ambulatory Visit: Payer: Self-pay

## 2018-12-04 ENCOUNTER — Ambulatory Visit (INDEPENDENT_AMBULATORY_CARE_PROVIDER_SITE_OTHER): Payer: Medicare HMO | Admitting: Adult Health

## 2018-12-04 ENCOUNTER — Encounter: Payer: Self-pay | Admitting: Adult Health

## 2018-12-04 ENCOUNTER — Telehealth: Payer: Self-pay

## 2018-12-04 VITALS — BP 202/88 | HR 70 | Temp 97.8°F | Ht 61.0 in | Wt 161.4 lb

## 2018-12-04 DIAGNOSIS — G4733 Obstructive sleep apnea (adult) (pediatric): Secondary | ICD-10-CM

## 2018-12-04 DIAGNOSIS — I6521 Occlusion and stenosis of right carotid artery: Secondary | ICD-10-CM

## 2018-12-04 DIAGNOSIS — I10 Essential (primary) hypertension: Secondary | ICD-10-CM

## 2018-12-04 DIAGNOSIS — E119 Type 2 diabetes mellitus without complications: Secondary | ICD-10-CM | POA: Diagnosis not present

## 2018-12-04 DIAGNOSIS — E785 Hyperlipidemia, unspecified: Secondary | ICD-10-CM | POA: Diagnosis not present

## 2018-12-04 DIAGNOSIS — Z794 Long term (current) use of insulin: Secondary | ICD-10-CM

## 2018-12-04 DIAGNOSIS — I639 Cerebral infarction, unspecified: Secondary | ICD-10-CM | POA: Diagnosis not present

## 2018-12-04 NOTE — Progress Notes (Signed)
Donna [name]    Provider:  Dr Jaynee Eagles Requesting Provider: Minette Brine, [place] Primary Care Provider:  Minette Brine, FNP  Chief Complaint  Patient presents with  . Follow-up    treatment room, hx of cva.28monthf/u. "Concerned about b/p. Went to ED on Friday"     HPI:  Initial visit 08/29/2018 AA:  Donna LIPPENSis a 68y.o. female here as requested by MMinette Brine FNP for fall, muscle weakness, abnormal CT brain. She has multiple atherosclerotic risk factors including uncontrolled hypertension with a history of noncompliance, uncontrolled diabetes mellitus and obesity. Patient says she did not go to the hospital after she had the CT of the head. She was at work and her left leg was hurting and she just fell. She didn't feel weak at the time. She denies sensory changes, facial droop, weakness. She says she does not feel weak today but she has different places where her muscles feel weak. She denies any focal weakness. She is a poor historian. But after the fall some people told her left face looked different. No palpitations except when she drinks caffeine. She is trying to eat better. She still does not take ASA 831m she says she forgets. She takes her other meds daily. Denies significant snoring or daytime somnolence. She states she sleep, feels well rested.  Reviewed notes, labs and imaging from outside physicians, which showed: CT head, reviewed and agree with the following:  Recent and likely acute infarct involving much of the right lentiform  nucleus as well as the adjacent external and extreme capsule regions. There is mild periventricular small vessel disease. No mass or hemorrhage.  Foci of arterial vascular calcification noted. Multifocal paranasal sinus disease noted, most severe in the left maxillary antrum. These results will be called to the ordering clinician or representative by the Radiologist Assistant, and communication documented in the PACS or  zVision Dashboard. 04/04/2018: Chol 237, ldl 131, BUN 23, Cr 1, hgba1c 11.4, TSH nml, CK 387, B12 1324 06/2018: HgbA1c 9.1, CK 135, Chol 237, ldl 166 I reviewed vascular surgery notes.  She had been followed by Dr. DiDoren Custardor extracranial carotid artery stenosis.  This note is from May 2019.  At that time she had one episode of difficulty speaking prior to her first visit on 12/17/2013.  She was supposed to be taking a statin, ACE inhibitor and p.o. insulin DM medication stating she forgot to take her 81 mg aspirin often.  Advised her to take 81 mg of aspirin every day.  Denies tobacco or EtOH use.  Noncompliant did not take her hypertension medication that day.  She has multiple atherosclerotic risk factors including uncontrolled hypertension with a history of noncompliance, uncontrolled diabetes mellitus and obesity. Carotid duplex in May 2019 showed bilateral ICA 1 to 39% stenosis, left ECA greater than 50% stenosis, bilateral vertebral artery flow is antegrade, bilateral subclavian artery waveforms are normal no significant changes to previous exams in April 2016 and 2017. I reviewed Jenice Moore's notes, in April 2020 patient was seen in the office due to a fall at work.  That was 1 week prior.  She lost her footing.  She complained of muscle weakness.  DeDarcey Noraoticed Babinski sign on the right side.   Update 12/04/2018: Donna [name] being seen today for 3-79-monthllow-up visit.  She continues to have mild left-sided weakness but does not interfere with daily activity or routine.  After prior visit, she had additional imaging with MRA head showing  right MCA M1 segment severe stenosis therefore was referred to vascular surgery and underwent image guided cerebral angiogram on 10/18/2018 by Dr. Estanislado Pandy.  Cerebral angiogram showed severe high-grade stenosis approximately 85% of mid M1 segment of right MCA, severe high-grade 90% stenosis of left vertebrobasilar junction.  Recommended being scheduled for  planned angioplasty/stent of right MCA stenosis as this was likely the cause of her stroke.  Procedure initially scheduled on 11/27/2018 but postponed due to P2Y12 level.  Recommended discontinuing Plavix and initiating Brilinta 90 mg twice daily in addition to aspirin 81 mg daily and procedure will take place once level within acceptable limits.  She has continued on aspirin and Brilinta without bleeding or bruising.  Continues to follow with PCP for uncontrolled DM and HTN management.  Blood pressure today elevated at 213/79 and manual recheck 202/88.  Asymptomatic.  She continues to work with PCP for uncontrolled blood pressure.  She currently obtained blood pressure cuff but has not started monitoring at home at this time.  She underwent sleep study on 10/29/2018 which showed mild OSA overall but severe apnea during REM and recommended initiating CPAP for management.  She is currently in the process of undergoing titration study that has been postponed due to vascular procedure.  Denies new or worsening stroke/TIA symptoms.     Review of Systems: Patient complains of symptoms per HPI as well as the following symptoms stroke. Pertinent negatives and positives per HPI. All others negative.   Social History   Socioeconomic History  . Marital status: Legally Separated    Spouse name: Not on file  . Number of children: Not on file  . Years of education: Not on file  . Highest education level: Not on file  Occupational History  . Not on file  Social Needs  . Financial resource strain: Not hard at all  . Food insecurity    Worry: Never true    Inability: Never true  . Transportation needs    Medical: No    Non-medical: No  Tobacco Use  . Smoking status: Never Smoker  . Smokeless tobacco: Never Used  Substance and Sexual Activity  . Alcohol use: No  . Drug use: No  . Sexual activity: Not Currently  Lifestyle  . Physical activity    Days per week: 0 days    Minutes per session: 0 min   . Stress: Not at all  Relationships  . Social Herbalist on phone: Not on file    Gets together: Not on file    Attends religious service: Not on file    Active member of club or organization: Not on file    Attends meetings of clubs or organizations: Not on file    Relationship status: Not on file  . Intimate partner violence    Fear of current or ex partner: No    Emotionally abused: No    Physically abused: No    Forced sexual activity: No  Other Topics Concern  . Not on file  Social History Narrative   Lives at home alone   Right handed    Family History  Problem Relation Age of Onset  . Stroke Mother   . Diabetes Father   . Diabetes Sister   . Breast cancer Neg Hx     Past Medical History:  Diagnosis Date  . Anemia    low iron - not since having fibroids removed  . Arthritis   . Diabetes mellitus without complication ([place])   .  Hyperlipidemia   . Hypertension   . Sleep apnea    just recently diagnosed with it, has not gotten the Cpap (done 10/29/18)  . Stroke [facility])    weakness on left side    Patient Active Problem List   Diagnosis Date Noted  . Snoring 10/29/2018  . Right-sided cerebrovascular accident (CVA) ([place]) 08/30/2018  . Non compliance w medication regimen 08/30/2018  . Atherosclerotic cerebrovascular disease 08/30/2018  . At high risk for stroke 08/30/2018  . Cerebral infarction ([place]) 07/09/2018  . Hyperlipidemia 07/08/2018  . Fall 07/08/2018  . Muscle weakness (generalized) 04/04/2018  . Fatigue 04/04/2018  . Diabetic retinopathy ([place]) 03/28/2018  . Type 2 diabetes mellitus ([place]) 12/09/2017  . Essential hypertension 12/09/2017  . Carotid artery disease ([place]) 12/17/2013    Past Surgical History:  Procedure Laterality Date  . COLONOSCOPY    . IR ANGIO INTRA EXTRACRAN SEL COM CAROTID INNOMINATE BILAT MOD SED  10/18/2018  . IR ANGIO VERTEBRAL SEL VERTEBRAL BILAT MOD SED  10/18/2018  . IR US GUIDE VASC ACCESS RIGHT  10/18/2018  .  MYOMECTOMY      Current Outpatient Medications  Medication Sig Dispense Refill  . amLODipine (NORVASC) 10 MG tablet Take 10 mg by mouth every evening.    Marland Kitchen aspirin EC 81 MG tablet Take 81 mg by mouth every evening.    . blood glucose meter kit and supplies KIT Dispense based on patient and insurance preference. Use up to four times daily as directed. (FOR ICD-9 250.00, 250.01). 1 each 0  . Calcium Carbonate-Vitamin D (CALCIUM-D PO) Take 3 tablets by mouth daily at 12 noon.    . carvedilol (COREG) 6.25 MG tablet Take 1 tablet (6.25 mg total) by mouth 2 (two) times daily. 180 tablet 3  . Echinacea-Golden Seal Complex CAPS Take 3 capsules by mouth daily at 12 noon.    . Insulin Glargine-Lixisenatide (SOLIQUA) 100-33 UNT-MCG/ML SOPN Inject 20 Units into the skin at bedtime. (Patient taking differently: Inject 25 Units into the skin at bedtime. ) 15 pen 0  . Multiple Vitamin (MULTIVITAMIN PO) Take 1 tablet by mouth daily at 12 noon.     . Omega-3 Fatty Acids (OMEGA-3 PLUS PO) Take 2 capsules by mouth daily at 12 noon.    Marland Kitchen telmisartan-hydrochlorothiazide (MICARDIS HCT) 40-12.5 MG tablet Take 1 tablet by mouth once daily (Patient taking differently: Take 1 tablet by mouth daily. ) 90 tablet 0  . ticagrelor (BRILINTA) 90 MG TABS tablet Take 90 mg by mouth 2 (two) times daily. Patient has not picked it up yet    . vitamin C (ASCORBIC ACID) 250 MG tablet Take 500 mg by mouth daily at 12 noon.     No current facility-administered medications for this visit.     Allergies as of 12/04/2018  . (No Known Allergies)    Vitals: Today's Vitals   12/04/18 1344  BP: (!) 213/79  Pulse: 70  Temp: 97.8 F (36.6 C)  Weight: 161 lb 6.4 oz (73.2 kg)  Height: _0  (1.549 m)   Body mass index is 30.5 kg/m.   Physical exam: General: well developed, well nourished, pleasant middle-age African-American female, seated, in no evident distress Head: head normocephalic and atraumatic.   Neck: supple with no  carotid or supraclavicular bruits Cardiovascular: regular rate and rhythm, no murmurs Musculoskeletal: no deformity Skin:  no rash/petichiae Vascular:  Normal pulses all extremities   Neurologic Exam Mental Status: Awake and fully alert. Oriented to place and time. Recent  and remote memory intact. Attention span, concentration and fund of knowledge appropriate. Mood and affect appropriate.  Cranial Nerves: Fundoscopic exam reveals sharp disc margins. Pupils equal, briskly reactive to light. Extraocular movements full without nystagmus. Visual fields full to confrontation. Hearing intact. Facial sensation intact.  Mild left lower facial weakness Motor: Normal bulk and tone. Normal strength in all tested extremity muscles except mild decreased left hand grip weakness and mild left hip flexor weakness. Sensory.: intact to touch , pinprick , position and vibratory sensation.  Coordination: Rapid alternating movements normal in all extremities except decreased left hand. Finger-to-nose and heel-to-shin performed accurately bilaterally. Gait and Station: Arises from chair without difficulty. Stance is normal. Gait demonstrates normal stride length and balance Reflexes: 1+ and symmetric. Toes downgoing.      Assessment/Plan:  68 year old with right-sided stroke seen on CT in April 2020 after a fall and c/o weakness. She has multiple atherosclerotic and stroke risk factors including uncontrolled hypertension with a history of noncompliance, uncontrolled diabetes mellitus, obesity and uncontrolled hyperlipidemia.  MRA showed severe right MCA M1 stenosis with plans on undergoing stent placement but this has been postponed due to P2Y12 level.  Residual deficits of mild left hemiparesis but has been stable from a stroke standpoint.  Continues to have uncontrolled HTN with BP elevated at today's visit.  1. Right-sided stroke: Continue aspirin 81 mg daily and Brilinta (ticagrelor) 90 mg bid for secondary  stroke prevention.  History of statin intolerance.  Maintain strict control of hypertension with blood pressure goal below 130/90, diabetes with hemoglobin A1c goal below 6.5% and cholesterol with LDL cholesterol (bad cholesterol) goal below 70 mg/dL.  I also advised the patient to eat a healthy diet with plenty of whole grains, cereals, fruits and vegetables, exercise regularly with at least 30 minutes of continuous activity daily and maintain ideal body weight. 2. HTN: Advised to continue current treatment regimen.  Attempted to reach out to PCP office but unable to connect with staff.  As she is asymptomatic, advised her to call PCP office today to schedule follow-up visit as soon as possible or to proceed to ED with continued elevated blood pressures or becomes symptomatic.  She verbalized understanding and in agreement to the plan 3. HLD: History of statin intolerance.  It was requested by Dr. Jaynee Eagles at prior visit to follow-up with cardiology for potential lipid clinic referral to initiate [place] K9 inhibitor for HLD management -does not appears that this occurred and advised patient to ensure follow-up cardiology appointment scheduled 4. DMII: Advised to continue to monitor glucose levels at home along with continued follow-up with PCP for management and monitoring 5. New dx OSA: Advised to continue to follow with cardiology to undergo titration study and initiation of CPAP for OSA management 6. Right MCA stenosis: Reschedule follow-up/procedure with vascular surgery to undergo possible stent placement   Follow-up in 3 months or call earlier if needed  Greater than 50% of time during this 25 minute visit was spent on counseling,explanation of diagnosis of right-sided stroke likely secondary to right MCA stenosis, reviewing risk factor management of right MCA stenosis, uncontrolled HTN, HLD, DM and new diagnosis of OSA, planning of further management, discussion with patient and and answering questions      Frann Rider, [facility]  [name] [address] [place],  [address]  Phone [phone] Fax [phone] Note: This document was prepared with digital dictation and possible smart phrase technology. Any transcriptional errors that result from this  process are unintentional.

## 2018-12-04 NOTE — Patient Instructions (Signed)
Monitor blood pressure at home and schedule follow-up with PCP as soon as possible for management.  If remains elevated any type to experience a headache, blurred vision or dizziness/lightheadedness, please call [phone] for further treatment  Follow-up with vascular surgery as scheduled to undergo stent placement of your right-sided vessel narrowing  Follow-up with cardiology as scheduled  Continue aspirin 81 mg daily and Brilinta (ticagrelor) 90 mg bid for secondary stroke prevention  Continue to follow up with PCP regarding cholesterol, blood pressure and diabetes management   Continue to monitor blood pressure at home  Maintain strict control of hypertension with blood pressure goal below 130/90, diabetes with hemoglobin A1c goal below 6.5% and cholesterol with LDL cholesterol (bad cholesterol) goal below 70 mg/dL. I also advised the patient to eat a healthy diet with plenty of whole grains, cereals, fruits and vegetables, exercise regularly and maintain ideal body weight.  Followup in the future with me in 3 months or call earlier if needed       Thank you for coming to see [place] at [facility]. I hope we have been able to provide you high quality care today.  You may receive a patient satisfaction survey over the next few weeks. We would appreciate your feedback and comments so that we may continue to improve ourselves and the health of our patients.

## 2018-12-04 NOTE — Telephone Encounter (Signed)
A MA from [facility] called stating the patient's blood pressure is 213/79 when she was there at her office.  I HAVE CALLED PT AND ADVISED HER THAT WE ARE GOING TO CALL HER CARDIOLOGIST IN THE MORNING. IT LOOKS LIKE PATIENT WAS SUPPOSED TO F/U WITH THEM ON HER HTN ON THE 16TH. YRL,RMA

## 2018-12-04 NOTE — Telephone Encounter (Signed)
Called Pts pcp in reguards to pts bp. No answer left VM.  Pts bp was 213/79. Pt stated that she was at the ED on Friday for her bp.

## 2018-12-05 ENCOUNTER — Telehealth: Payer: Self-pay

## 2018-12-05 NOTE — Telephone Encounter (Signed)
Patient called stating her blood pressure medicine was not at the pharmacy.  I RETURNED PT CALL TO NOTIFY HER THAT SHE ALREADY HAS THE BP MED WE WANT HER TO TAKE IT IS THE AMLODIPINE 10MG  DAILY AND SHE NEEDS TO CONTACT HER CARDIOLOGIST TO SCHEDULE AN APPOINTMENT ASAP. LEFT PT V/M TO CALL THE OFFICE. Lonia Mad

## 2018-12-05 NOTE — Progress Notes (Signed)
Made any corrections needed, and agree with history, physical, neuro exam,assessment and plan as stated.     Antonia Ahern, MD [facility]  

## 2018-12-06 ENCOUNTER — Telehealth: Payer: Self-pay

## 2018-12-06 ENCOUNTER — Other Ambulatory Visit: Payer: Self-pay | Admitting: Nurse Practitioner

## 2018-12-10 ENCOUNTER — Ambulatory Visit (INDEPENDENT_AMBULATORY_CARE_PROVIDER_SITE_OTHER): Payer: Medicare HMO | Admitting: Pharmacist

## 2018-12-10 DIAGNOSIS — I10 Essential (primary) hypertension: Secondary | ICD-10-CM | POA: Diagnosis not present

## 2018-12-10 DIAGNOSIS — Z794 Long term (current) use of insulin: Secondary | ICD-10-CM

## 2018-12-10 DIAGNOSIS — E785 Hyperlipidemia, unspecified: Secondary | ICD-10-CM | POA: Diagnosis not present

## 2018-12-10 DIAGNOSIS — E119 Type 2 diabetes mellitus without complications: Secondary | ICD-10-CM | POA: Diagnosis not present

## 2018-12-12 ENCOUNTER — Telehealth: Payer: Self-pay

## 2018-12-12 ENCOUNTER — Other Ambulatory Visit: Payer: Self-pay | Admitting: Pharmacy Technician

## 2018-12-12 NOTE — Patient Outreach (Signed)
[facility]) Care Management  12/12/2018  Donna [name] 1950/04/11 [id number]   Received request from [place] to [employer] patient assistance application for [place] to patient.  Maud Deed Chana Bode [place] Certified Pharmacy Technician [place] Management Direct [phone]

## 2018-12-12 NOTE — Telephone Encounter (Signed)
I called [place] and spoke with Janelle in accomadations regarding patient's claim because patient stated she has not received a payment yet. [place] stated patient has been approved through 12/29/18 and she has been receiving payments through direct deposits and advised me to notify patient that she needs to contact them at [phone] if she has not been receiving the payments. YRL,RMA

## 2018-12-14 NOTE — Progress Notes (Signed)
Chronic Care Management   Visit Note  12/10/2018 Name: Donna [name] MRN: [id number] DOB: Sep 28, 1950  Referred by: Donna Brine, FNP Reason for referral : Chronic Care Management   Donna [name] is a 68 y.o. year old female who is a primary care patient of Donna [name], [place]. The CCM team was consulted for assistance with chronic disease management and care coordination needs.   Review of patient status, including review of consultants reports, relevant laboratory and other test results, and collaboration with appropriate care team members and the patient's provider was performed as part of comprehensive patient evaluation and provision of chronic care management services.    I spoke with Donna [name] by telephone today.  Advanced Directives Status: N See Care Plan and [employer] application for related entries.   Medications: Outpatient Encounter Medications as of 12/10/2018  Medication Sig Note  . amLODipine (NORVASC) 10 MG tablet Take 1 tablet by mouth once daily   . aspirin EC 81 MG tablet Take 81 mg by mouth every evening.   . blood glucose meter kit and supplies KIT Dispense based on patient and insurance preference. Use up to four times daily as directed. (FOR ICD-9 250.00, 250.01). 06/13/2018: Bought ReliON meter from [facility]    . Calcium Carbonate-Vitamin D (CALCIUM-D PO) Take 3 tablets by mouth daily at 12 noon.   . carvedilol (COREG) 6.25 MG tablet Take 1 tablet (6.25 mg total) by mouth 2 (two) times daily.   Wyvonnia Dusky Seal Complex CAPS Take 3 capsules by mouth daily at 12 noon.   . Insulin Glargine-Lixisenatide (SOLIQUA) 100-33 UNT-MCG/ML SOPN Inject 20 Units into the skin at bedtime. (Patient taking differently: Inject 25 Units into the skin at bedtime. )   . Multiple Vitamin (MULTIVITAMIN PO) Take 1 tablet by mouth daily at 12 noon.    . Omega-3 Fatty Acids (OMEGA-3 PLUS PO) Take 2 capsules by mouth daily at 12 noon.   Marland Kitchen telmisartan-hydrochlorothiazide (MICARDIS HCT)  40-12.5 MG tablet Take 1 tablet by mouth once daily (Patient taking differently: Take 1 tablet by mouth daily. )   . ticagrelor (BRILINTA) 90 MG TABS tablet Take 90 mg by mouth 2 (two) times daily. Patient has not picked it up yet   . vitamin C (ASCORBIC ACID) 250 MG tablet Take 500 mg by mouth daily at 12 noon.    No facility-administered encounter medications on file as of 12/10/2018.      Objective:   Goals Addressed            This Visit's Progress     Patient Stated   . I need help paying for my medications (pt-stated)       Current Barriers:  . Financial Barriers: patient has [employer] and reports copay for [employer] ($134/month) is cost prohibitive at this time  Pharmacist Clinical Goal(s):  Marland Kitchen Over the next 30 days, patient will work with PharmD and providers to relieve medication access concerns  Interventions: . Comprehensive medication review completed; medication list updated in electronic medical record.  Donna [name] by [employer]: Patient meets income/3% out of pocket spend criteria for this medication's patient assistance program. Reviewed application process. Patient will provide proof of income, out of pocket spend report, and will sign application. Will collaborate with primary care provider, Donna Brine, DNP, FNP for their portion of application. Once completed, will submit to [facility] patient assistance program. . Patient states she did not receive application.  Resent Soliqua application per patient request.  Patient Self Care Activities:  .  Patient will provide necessary portions of application   Please see past updates related to this goal by clicking on the "Past Updates" button in the selected goal       . I need to control & monitor my blood sugar (pt-stated)       Current Barriers:  . Diabetes: T2DM; most recent A1c 11.3% on 11/27/18  (A1c has decreased from 12.3% on 10/07/18--> patient motivated to decrease A1c towards goal  Patient states her diet has not  been ideal,but has improved since we last spoke as her son si continuing to prepare appropriate meals.   . Current antihyperglycemic regimen: Soliqua 20 units daily (reports compliance) . Denies hypoglycemic symptoms . Reports hyperglycemic symptoms: headaches, polydipsia . Current meal patterns: son is cooking lower carbohydrate meals o Drinks: sugary drinks, trying to drink more water . Current exercise: trying to walk . Current blood glucose readings: FBG 180-200s . Cardiovascular risk reduction: o Current hypertensive regimen: amlodipine (now taking), carvedilol, telmisartan/hctz o Current hyperlipidemia regimen: n/a-->statin intolerance (appt with lipid clinic to arrange Repatha) o Reinforce importance of daily aspirin.  Patient not taking as presribed.  She is now taking Brilinta.  She has "failed Plavix" per patient and clinical documentation  Pharmacist Clinical Goal(s):  Marland Kitchen Over the next 90 days, patient with work with PharmD and primary care provider to address optimized medication & disease state management (diabetes).  Interventions: . Comprehensive medication review performed, medication list updated in electronic medical record . Reviewed medication fill history via insurance claims data.  . Reviewed & discussed the following diabetes-related information with patient: o Continue checking blood sugars as directed o Follow ADA recommended "diabetes-friendly" diet  (reviewed healthy snack/food options).  Mailed diabetes materials and snack food options.  Suggested low/zero sugar drinks for patient to try.  She states she is having trouble just drinking water. o Discussed insulin/GLP-1 injection technique/instructions. o Reviewed medication purpose/side effects-->patient denies adverse events, denies hypoglycemia o Continue taking all medications as prescribed by provider  Patient Self Care Activities:  . Patient will check blood glucose daily , document, and provide at future  appointments . Patient will focus on medication adherence by taking all medications as prescribed. . Patient will take medications as prescribed . Patient will contact provider with any episodes of hypoglycemia . Patient will report any questions or concerns to provider    Please see past updates related to this goal by clicking on the "Past Updates" button in the selected goal           Plan:   The care management team will reach out to the patient again over the next 4-6 weeks.  Regina Eck, PharmD, BCPS Clinical Pharmacist, [facility] [place]: [phone]

## 2018-12-14 NOTE — Patient Instructions (Signed)
Visit Information  Goals Addressed            This Visit's Progress     Patient Stated   . I need help paying for my medications (pt-stated)       Current Barriers:  . Financial Barriers: patient has [employer] and reports copay for [employer] ($134/month) is cost prohibitive at this time  Pharmacist Clinical Goal(s):  Marland Kitchen Over the next 30 days, patient will work with PharmD and providers to relieve medication access concerns  Interventions: . Comprehensive medication review completed; medication list updated in electronic medical record.  [name] by [employer]: Patient meets income/3% out of pocket spend criteria for this medication's patient assistance program. Reviewed application process. Patient will provide proof of income, out of pocket spend report, and will sign application. Will collaborate with primary care provider, Minette Brine, DNP, FNP for their portion of application. Once completed, will submit to [facility] patient assistance program. . Patient states she did not receive application.  Resent Soliqua application per patient request.  Patient Self Care Activities:  . Patient will provide necessary portions of application   Please see past updates related to this goal by clicking on the "Past Updates" button in the selected goal       . I need to control & monitor my blood sugar (pt-stated)       Current Barriers:  . Diabetes: T2DM; most recent A1c 11.3% on 11/27/18  (A1c has decreased from 12.3% on 10/07/18--> patient motivated to decrease A1c towards goal  Patient states her diet has not been ideal,but has improved since we last spoke as her son si continuing to prepare appropriate meals.   . Current antihyperglycemic regimen: Soliqua 20 units daily (reports compliance) . Denies hypoglycemic symptoms . Reports hyperglycemic symptoms: headaches, polydipsia . Current meal patterns: son is cooking lower carbohydrate meals o Drinks: sugary drinks, trying to drink more  water . Current exercise: trying to walk . Current blood glucose readings: FBG 180-200s . Cardiovascular risk reduction: o Current hypertensive regimen: amlodipine (now taking), carvedilol, telmisartan/hctz o Current hyperlipidemia regimen: n/a-->statin intolerance (appt with lipid clinic to arrange Repatha) o Reinforce importance of daily aspirin.  Patient not taking as presribed.  She is now taking Brilinta.  She has "failed Plavix" per patient and clinical documentation  Pharmacist Clinical Goal(s):  Marland Kitchen Over the next 90 days, patient with work with PharmD and primary care provider to address optimized medication & disease state management (diabetes).  Interventions: . Comprehensive medication review performed, medication list updated in electronic medical record . Reviewed medication fill history via insurance claims data.  . Reviewed & discussed the following diabetes-related information with patient: o Continue checking blood sugars as directed o Follow ADA recommended "diabetes-friendly" diet  (reviewed healthy snack/food options).  Mailed diabetes materials and snack food options.  Suggested low/zero sugar drinks for patient to try.  She states she is having trouble just drinking water. o Discussed insulin/GLP-1 injection technique/instructions. o Reviewed medication purpose/side effects-->patient denies adverse events, denies hypoglycemia o Continue taking all medications as prescribed by provider  Patient Self Care Activities:  . Patient will check blood glucose daily , document, and provide at future appointments . Patient will focus on medication adherence by taking all medications as prescribed. . Patient will take medications as prescribed . Patient will contact provider with any episodes of hypoglycemia . Patient will report any questions or concerns to provider    Please see past updates related to this goal by clicking  on the "Past Updates" button in the selected goal          The patient verbalized understanding of instructions provided today and declined a print copy of patient instruction materials.   The care management team will reach out to the patient again over the next 4-6 weeks.  Kieth Brightly, PharmD, BCPS Clinical Pharmacist, [facility]  [facility]  Direct Dial: [phone]

## 2018-12-16 ENCOUNTER — Telehealth: Payer: Self-pay

## 2018-12-16 NOTE — Telephone Encounter (Signed)
Patient called stating her blood pressure is still running high and she is having dizzy spells. I have called her Cardiologist and scheduled her an appointment with them since she didn't f/u with them Sept as directed with her provider. I was unable to leave pt a v/m. YRL,RMA

## 2018-12-16 NOTE — Telephone Encounter (Signed)
Attempted to call patient's son and was unable to leave a v/m. YRL,RMA

## 2018-12-16 NOTE — Telephone Encounter (Signed)
I notified patient that she has an appointment with her Cardiologist tomorrow at 10:15am to be evaluated with Bunnie Domino for her blood pressure. YRL,RMA

## 2018-12-16 NOTE — Progress Notes (Signed)
Cardiology Office Note   Date:  12/17/2018   ID:  Donna [name], DOB [birth year], MRN [id number]  PCP:  Minette Brine, FNP  Cardiologist: Dr. Margaretann Loveless  CC: Hypertension    History of Present Illness: Donna [name] is a 68 y.o. female who presents for ongoing assessment of hypertension, hx of CVA, hyperlipidemia. Other history includes Type II diabetes. She was last seen by Dr. Margaretann Loveless on 09/20/2018.  She complained of palpitations and was started on 6.25 mg BID for BP control and reduction of hypertension. She was also scheduled for a sleep study which revealed mild OSA,  and a carotid artery ultrasound which was abnormal. She was referred to vein and vascular physician.. She is here to discuss her response to medications.   She was seen in the ED on 11/30/2018, for complaints of tachycardia and racing HR. She was ruled out for ACS, treated with IV fluids. She was found to have hyperglycemia. She was noted to have not taken her medications to include antihypertensives, and insulin.   She did follow up with neurology. 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 She was to have a planned angioplasty/stent of right MCA, for 11/27/2018. This was postponed due to P2Y12 level. She was recommended to stop Plavix and start Brilinta 90 mg BID and ASA 81 mg daily. She was found to be hypertensive 213/79.   The patient comes today without any complaints.  She has not yet taken her medications and is hypertensive today.  She continues to have some complaints of headache and blurred vision at times but no chest pressure, tachycardia, or dyspnea.  Past Medical History:  Diagnosis Date  . Anemia    low iron - not since having fibroids removed  . Arthritis   . Diabetes mellitus without complication ([place])   . Hyperlipidemia   .  Hypertension   . Sleep apnea    just recently diagnosed with it, has not gotten the Cpap (done 10/29/18)  . Stroke [facility])    weakness on left side    Past Surgical History:  Procedure Laterality Date  . COLONOSCOPY    . IR ANGIO INTRA EXTRACRAN SEL COM CAROTID INNOMINATE BILAT MOD SED  10/18/2018  . IR ANGIO VERTEBRAL SEL VERTEBRAL BILAT MOD SED  10/18/2018  . IR US GUIDE VASC ACCESS RIGHT  10/18/2018  . MYOMECTOMY       Current Outpatient Medications  Medication Sig Dispense Refill  . amLODipine (NORVASC) 10 MG tablet Take 1 tablet by mouth once daily 90 tablet 0  . aspirin EC 81 MG tablet Take 81 mg by mouth every evening.    . blood glucose meter kit and supplies KIT Dispense based on patient and insurance preference. Use up to four times daily as directed. (FOR ICD-9 250.00, 250.01). 1 each 0  . Calcium Carbonate-Vitamin D (CALCIUM-D PO) Take 3 tablets by mouth daily at 12 noon.    . carvedilol (COREG) 6.25 MG tablet Take 1 tablet (6.25 mg total) by mouth 2 (two) times daily. 180 tablet 3  . Echinacea-Golden Seal Complex CAPS Take 3 capsules by mouth daily at 12 noon.    . Insulin Glargine-Lixisenatide (SOLIQUA) 100-33 UNT-MCG/ML SOPN Inject 20 Units into the skin at bedtime. (Patient taking differently: Inject 25 Units into the skin at bedtime. ) 15 pen 0  .  Multiple Vitamin (MULTIVITAMIN PO) Take 1 tablet by mouth daily at 12 noon.     . Omega-3 Fatty Acids (OMEGA-3 PLUS PO) Take 2 capsules by mouth daily at 12 noon.    Marland Kitchen telmisartan-hydrochlorothiazide (MICARDIS HCT) 40-12.5 MG tablet Take 1 tablet by mouth once daily (Patient taking differently: Take 1 tablet by mouth daily. ) 90 tablet 0  . ticagrelor (BRILINTA) 90 MG TABS tablet Take 90 mg by mouth 2 (two) times daily. Patient has not picked it up yet    . vitamin C (ASCORBIC ACID) 250 MG tablet Take 500 mg by mouth daily at 12 noon.     No current facility-administered medications for this visit.     Allergies:   Patient has no  known allergies.    Social History:  The patient  reports that she has never smoked. She has never used smokeless tobacco. She reports that she does not drink alcohol or use drugs.   Family History:  The patient's family history includes Diabetes in her father and sister; Stroke in her mother.    ROS: All other systems are reviewed and negative. Unless otherwise mentioned in H&P    PHYSICAL EXAM: VS:  BP (!) 187/84   Pulse 79   Ht _0  (1.549 m)   Wt 161 lb (73 kg)   SpO2 98%   BMI 30.42 kg/m  , BMI Body mass index is 30.42 kg/m. GEN: Well nourished, well developed, in no acute distress HEENT: normal Neck: no JVD, carotid bruits, or masses Cardiac: RRR; tachycardic, no murmurs, rubs, or gallops,no edema  Respiratory:  Clear to auscultation bilaterally, normal work of breathing GI: soft, nontender, nondistended, + BS MS: no deformity or atrophy Skin: warm and dry, no rash Neuro:  Strength and sensation are intact Psych: euthymic mood, full affect   EKG: Not completed this office visit  Recent Labs: 04/04/2018: TSH 0.568 10/07/2018: ALT 20 11/29/2018: BUN 18; Creatinine, Ser 0.98; Hemoglobin 13.5; Platelets 290; Potassium 3.8; Sodium 136    Lipid Panel    Component Value Date/Time   CHOL 271 (H) 10/07/2018 1259   TRIG 201 (H) 10/07/2018 1259   HDL 60 10/07/2018 1259   CHOLHDL 4.5 (H) 10/07/2018 1259   LDLCALC 171 (H) 10/07/2018 1259      Wt Readings from Last 3 Encounters:  12/17/18 161 lb (73 kg)  12/04/18 161 lb 6.4 oz (73.2 kg)  11/28/18 161 lb 9.6 oz (73.3 kg)      Other studies Reviewed:  Echocardiogram 10/08/2018 1. The left ventricle has hyperdynamic systolic function, with an ejection fraction of >65%. The cavity size was normal. There is moderate asymmetric left ventricular hypertrophy with at least mild concentric hypertrophy. Left ventricular diastolic  Doppler parameters are consistent with impaired relaxation. Elevated left ventricular  end-diastolic pressure The E/e' is 22.  2. The right ventricle has normal systolic function. The cavity was normal. There is no increase in right ventricular wall thickness.  3. The mitral valve is abnormal. There is moderate to severe bulky mitral annular calcification present.  4. The aortic valve has an indeterminate number of cusps. Aortic valve regurgitation is trivial by color flow Doppler.  5. The aortic root is normal in size and structure.  6. No intracardiac thrombi or masses were visualized.  ASSESSMENT AND PLAN:  1.  Uncontrolled hypertension: Blood pressure is elevated today but she has not taken any of her medications.  She did bring it with her and I provided her with some  water so that she can take it in the clinic.  She is going to move in with her son this week, who will assist in her medications and taking her blood pressure each day.  I have encouraged her to take her blood pressure and write it down.    If it remains elevated she will need to contact us.  I am uncertain what her blood pressure is after taking medications and therefore she will need to wait to take her blood pressure 2 hours after taking her medications.  She is to avoid salted foods.  She is to see [place] again in 3 months unless symptomatic.  2.  Carotid artery disease: She is being followed by neurology and vein and vascular.  No date has been planned for intervention at this time due to change from Plavix to Brilinta.  I have reinforced the need to keep her blood pressure under control in this setting.  She verbalizes understanding.  3.  History of CVA: Continue to follow by neurology.  She will need family support for ongoing reinforcement of low-sodium diet and taking her medicines daily.  She is moving in with her son who is going to be helping her with this.  I have encouraged her to allow him to do so.  4.  Type 2 diabetes: To be followed by PCP.  Current medicines are reviewed at length with the patient  today.    Labs/ tests ordered today include: None Phill Myron. West Pugh, ANP, AACC   12/17/2018 10:51 AM    [facility] [address] Office [phone] Fax [phone]

## 2018-12-17 ENCOUNTER — Ambulatory Visit (INDEPENDENT_AMBULATORY_CARE_PROVIDER_SITE_OTHER): Payer: Medicare HMO

## 2018-12-17 ENCOUNTER — Ambulatory Visit (INDEPENDENT_AMBULATORY_CARE_PROVIDER_SITE_OTHER): Payer: Medicare HMO | Admitting: Adult Health

## 2018-12-17 ENCOUNTER — Encounter: Payer: Self-pay | Admitting: Adult Health

## 2018-12-17 ENCOUNTER — Telehealth: Payer: Medicare HMO

## 2018-12-17 ENCOUNTER — Other Ambulatory Visit: Payer: Self-pay

## 2018-12-17 VITALS — BP 187/84 | HR 79 | Ht 61.0 in | Wt 161.0 lb

## 2018-12-17 DIAGNOSIS — I6523 Occlusion and stenosis of bilateral carotid arteries: Secondary | ICD-10-CM | POA: Diagnosis not present

## 2018-12-17 DIAGNOSIS — I639 Cerebral infarction, unspecified: Secondary | ICD-10-CM | POA: Diagnosis not present

## 2018-12-17 DIAGNOSIS — E119 Type 2 diabetes mellitus without complications: Secondary | ICD-10-CM | POA: Diagnosis not present

## 2018-12-17 DIAGNOSIS — E785 Hyperlipidemia, unspecified: Secondary | ICD-10-CM

## 2018-12-17 DIAGNOSIS — Z794 Long term (current) use of insulin: Secondary | ICD-10-CM

## 2018-12-17 DIAGNOSIS — E11319 Type 2 diabetes mellitus with unspecified diabetic retinopathy without macular edema: Secondary | ICD-10-CM

## 2018-12-17 DIAGNOSIS — I10 Essential (primary) hypertension: Secondary | ICD-10-CM | POA: Diagnosis not present

## 2018-12-17 NOTE — Patient Instructions (Signed)
Medication Instructions:  Continue current medications  If you need a refill on your cardiac medications before your next appointment, please call your pharmacy.  Labwork: None Ordered   Testing/Procedures: None Ordered   Follow-Up: You will need a follow up appointment in 3 months.  Please call our office 2 months in advance to schedule this appointment.  You may see Dr Acharya or one of the following Advanced Practice Providers on your designated Care Team:   Rhonda Barrett, PA-C . Kathryn Lawrence, DNP, ANP     At [facility], you and your health needs are our priority.  As part of our continuing mission to provide you with exceptional heart care, we have created designated Provider Care Teams.  These Care Teams include your primary Cardiologist (physician) and Advanced Practice Providers (APPs -  Physician Assistants and Nurse Practitioners) who all work together to provide you with the care you need, when you need it.  Thank you for choosing [facility] at [facility]. 

## 2018-12-18 ENCOUNTER — Telehealth (HOSPITAL_COMMUNITY): Payer: Self-pay | Admitting: Radiology

## 2018-12-18 NOTE — Chronic Care Management (AMB) (Signed)
Chronic Care Management   Initial Visit Note  12/17/2018 Name: Donna [name] MRN: [id number] DOB: 09/18/1950  Referred by: Minette Brine, FNP Reason for referral : Chronic Care Management (INITIAL CCM RNCM Telephone Outreach )   Donna [name] is a 68 y.o. year old female who is a primary care patient of Minette Brine, [place]. The CCM team was consulted for assistance with chronic disease management and care coordination needs related to HTN DMII Arthritis  Review of patient status, including review of consultants reports, relevant laboratory and other test results, and collaboration with appropriate care team members and the patient's provider was performed as part of comprehensive patient evaluation and provision of chronic care management services.    SDOH (Social Determinants of Health) screening performed today: Physical Activity. See Care Plan for related entries.   Advanced Directives Status: N See Care Plan and [employer] application for related entries.   I spoke with Donna [name] by telephone today to assess for CCM RN CM needs and her care plan was updated.   Medications: Outpatient Encounter Medications as of 12/17/2018  Medication Sig Note   amLODipine (NORVASC) 10 MG tablet Take 1 tablet by mouth once daily    aspirin EC 81 MG tablet Take 81 mg by mouth every evening.    blood glucose meter kit and supplies KIT Dispense based on patient and insurance preference. Use up to four times daily as directed. (FOR ICD-9 250.00, 250.01). 06/13/2018: Bought [employer] meter from [employer]     Calcium Carbonate-Vitamin D (CALCIUM-D PO) Take 2 tablets by mouth daily at 12 noon.     carvedilol (COREG) 6.25 MG tablet Take 1 tablet (6.25 mg total) by mouth 2 (two) times daily.    Echinacea-Golden Seal Complex CAPS Take 3 capsules by mouth daily at 12 noon.    Insulin Glargine-Lixisenatide (SOLIQUA) 100-33 UNT-MCG/ML SOPN Inject 20 Units into the skin at bedtime. (Patient taking differently: Inject  25 Units into the skin at bedtime. )    Multiple Vitamin (MULTIVITAMIN PO) Take 1 tablet by mouth daily at 12 noon.     telmisartan-hydrochlorothiazide (MICARDIS HCT) 40-12.5 MG tablet Take 1 tablet by mouth once daily (Patient taking differently: Take 1 tablet by mouth daily. )    ticagrelor (BRILINTA) 90 MG TABS tablet Take 90 mg by mouth 2 (two) times daily. Patient has not picked it up yet    vitamin C (ASCORBIC ACID) 250 MG tablet Take 500 mg by mouth daily at 12 noon.    Omega-3 Fatty Acids (OMEGA-3 PLUS PO) Take 2 capsules by mouth daily at 12 noon. 12/17/2018: Patient will refill    No facility-administered encounter medications on file as of 12/17/2018.      Objective:  Lab Results  Component Value Date   HGBA1C 11.3 (H) 11/27/2018   HGBA1C 12.3 (H) 10/07/2018   HGBA1C 9.1 (H) 07/08/2018   Lab Results  Component Value Date   MICROALBUR 150 01/02/2018   LDLCALC 171 (H) 10/07/2018   CREATININE 0.98 11/29/2018   BP Readings from Last 3 Encounters:  12/17/18 (!) 187/84  12/04/18 (!) 202/88  11/30/18 (!) 181/83    Goals Addressed      Patient Stated    "I have arthritis pain in my hands, knees and legs" (pt-stated)       Current Barriers:   Knowledge Deficits related to diagnosis and treatment management for arthritis  Impaired Physical Mobility   Impaired Dexterity   Nurse Case Manager Clinical Goal(s):   Over  the next 60 days, patient will work with CCM RN CM and PCP to address needs related to arthritis pain management   Over the next 60 days, patient will work with in home PT to establish a safe and effective HEP to help improve physical mobility, ROM, balance, strengthening and overall stamina as evidence by patient will have increased ability to perform Self Care.   CCM RN CM Interventions:  12/17/18 spoke with patient   Evaluation of current treatment plan related to arthritis pain and decreased dexterity and patient's adherence to plan as established  by provider; discussed patient is unsure of what type of arthritis she has, reports her sister has RA, she has not been tested; reports having pain in her hands, knees and legs with decreased ROM and poor dexterity  Provided education to patient re: the benefits of receiving in home PT to assist with ROM, improve physical mobility, dexterity, and strengthening   Collaborated with provider Minette Brine, FNP regarding request to evaluate for RA at next OV; requested in home PT referral   Discussed plans with patient for ongoing care management follow up and provided patient with direct contact information for care management team  Patient Self Care Activities with son's assistance   Self administers medications as prescribed  Attends all scheduled provider appointments  Calls pharmacy for medication refills  Performs ADL's independently  Performs IADL's independently  Calls provider office for new concerns or questions  Initial goal documentation      "I know I need some help with my sugar" (pt-stated)       Current Barriers:   Knowledge Deficits related to long term plan for management of DM  Knowledge Deficits related to recommended diet/exercise plan  Non-adherence to Self monitoring CBG's   Nurse Case Manager Clinical Goal(s): Over the next 90 days, patient will work with [facility] to establish long term plan of care and self health management strategies for DM disease management. Goal Not Met  12/18/18: Over the next 90 days, patient will work with the CCM team to develop a Diabetes disease management plan to improve her knowledge and understanding of how to improve Self Health management of DM.   CCM RN CM Interventions:  12/17/18 call completed with patient   Evaluation of current treatment plan related to Diabetes Mellitus and patient's adherence to plan as established by provider.  Provided education to patient re: most recent A1C of 11.3 obtained on 11/27/18;  discussed target A1C <7.0; discussed potential complications of uncontrolled DM; discussed Self Health management to lower A1C including adherence to following ADA diet, implementing routine exercise and taking diabetic medications exactly as prescribed  Reviewed medications with patient and discussed indication, dosage and frequency of prescribed medications; patient denies questions at this time, no med discrepancies noted at this time  Discussed plans with patient for ongoing care management follow up and provided patient with direct contact information for care management team  Provided patient with printed educational materials related to Diabetes Management; Diabetes Zone Tool, signs/symptoms Hypo/Hyperglycemia; Blood sugar log  Advised patient, providing education and rationale, to check cbg daily before meals and record, calling the CCM team and or PCP for findings outside established parameters.  <80 and or >250  Discussed patient is not checking her CBG's; she reports working with embedded Pharm D Lottie Dawson to obtain a [employer]   Patient Self Care Activities with son's assistance  Self administers medications as prescribed  Attends all scheduled provider appointments  Calls  pharmacy for medication refills  Attends church or other social activities  Performs ADL's independently  Performs IADL's independently  Calls provider office for new concerns or questions   Please see past updates related to this goal by clicking on the "Past Updates" button in the selected goal       "I'll have my son check my BP" (pt-stated)       Current Barriers:   Knowledge Deficits related to Hypertension disease process, potential complications related to uncontrolled BP and importance of Self monitoring BP   Nurse Case Manager Clinical Goal(s):   Over the next 90 days, patient will verbalize understanding of plan for having son assist with Self monitoring her BP at home  and logging her readings  CCM RN CM Interventions:  12/17/18 call completed with patient   Evaluation of current treatment plan related to Hypertension and patient's adherence to plan as established by provider.  Provided education to patient re: hypertensive causes, clinical manifestations, prevention, complications, and medical management; Discussed target BP 130/80; educated patient on importance of Self monitoring her BP and recording her readings to help ensure her BP is consistently well controlled; patient admits she is unable to use the BP cuff due to poor dexterity but feels certain her son will help her monitor and record her readings, patient has a cuff at home  Reviewed medications with patient and discussed indication, dosage and frequency of her prescribed hypertensive medications; stressed importance of patient taking her medications exactly as prescribed at the same time everyday w/o missed doses  Discussed plans with patient for ongoing care management follow up and provided patient with direct contact information for care management team  Provided patient with printed educational materials related to BP log   Patient Self Care Activities with son's assistance  Self administers medications as prescribed  Attends all scheduled provider appointments  Calls pharmacy for medication refills  Performs ADL's independently  Performs IADL's independently  Calls provider office for new concerns or questions  Initial goal documentation        Plan:   Telephone follow up appointment with care management team member scheduled for: 01/13/19   Barb Merino, RN, BSN, CCM Care Management Coordinator [facility]  Direct Phone: [phone]

## 2018-12-18 NOTE — Telephone Encounter (Signed)
Called pt to see how she was doing and to speak about rescheduling her stenosis treatment with [name]. Pt states that her BP is uncontrolled and running very high still. I sent a message to her heart doctor's office to see how we can get this under control and get her scheduled for treatment. JM

## 2018-12-18 NOTE — Patient Instructions (Signed)
Visit Information  Goals Addressed      Patient Stated   . "I have arthritis pain in my hands, knees and legs" (pt-stated)       Current Barriers:  . Knowledge Deficits related to diagnosis and treatment management for arthritis . Impaired Physical Mobility  . Impaired Dexterity   Nurse Case Manager Clinical Goal(s):  . Over the next 60 days, patient will work with CCM RN CM and PCP to address needs related to arthritis pain management  . Over the next 60 days, patient will work with in home PT to establish a safe and effective HEP to help improve physical mobility, ROM, balance, strengthening and overall stamina as evidence by patient will have increased ability to perform Self Care.   CCM RN CM Interventions:  12/17/18 spoke with patient  . Evaluation of current treatment plan related to arthritis pain and decreased dexterity and patient's adherence to plan as established by provider; discussed patient is unsure of what type of arthritis she has, reports her sister has RA, she has not been tested; reports having pain in her hands, knees and legs with decreased ROM and poor dexterity . Provided education to patient re: the benefits of receiving in home PT to assist with ROM, improve physical mobility, dexterity, and strengthening  . Collaborated with provider [name] Moore, FNP regarding request to evaluate for RA at next OV; requested in home PT referral  . Discussed plans with patient for ongoing care management follow up and provided patient with direct contact information for care management team  Patient Self Care Activities with son's assistance  . Self administers medications as prescribed . Attends all scheduled provider appointments . Calls pharmacy for medication refills . Performs ADL's independently . Performs IADL's independently . Calls provider office for new concerns or questions  Initial goal documentation     . "I know I need some help with my sugar" (pt-stated)        Current Barriers:  . Knowledge Deficits related to long term plan for management of DM . Knowledge Deficits related to recommended diet/exercise plan . Non-adherence to Self monitoring CBG's   Nurse Case Manager Clinical Goal(s): Over the next 90 days, patient will work with RNCM to establish long term plan of care and self health management strategies for DM disease management. Goal Not Met . 12/18/18: Over the next 90 days, patient will work with the CCM team to develop a Diabetes disease management plan to improve her knowledge and understanding of how to improve Self Health management of DM.   CCM RN CM Interventions:  12/17/18 call completed with patient  . Evaluation of current treatment plan related to Diabetes Mellitus and patient's adherence to plan as established by provider. . Provided education to patient re: most recent A1C of 11.3 obtained on 11/27/18; discussed target A1C <7.0; discussed potential complications of uncontrolled DM; discussed Self Health management to lower A1C including adherence to following ADA diet, implementing routine exercise and taking diabetic medications exactly as prescribed . Reviewed medications with patient and discussed indication, dosage and frequency of prescribed medications; patient denies questions at this time, no med discrepancies noted at this time . Discussed plans with patient for ongoing care management follow up and provided patient with direct contact information for care management team . Provided patient with printed educational materials related to Diabetes Management; Diabetes Zone Tool, signs/symptoms Hypo/Hyperglycemia; Blood sugar log . Advised patient, providing education and rationale, to check cbg daily before meals and record,   calling the CCM team and or PCP for findings outside established parameters.  <80 and or >250 . Discussed patient is not checking her CBG's; she reports working with embedded Pharm D Julie Pruitt to  obtain a Freestyle Libre Meter   Patient Self Care Activities with son's assistance . Self administers medications as prescribed . Attends all scheduled provider appointments . Calls pharmacy for medication refills . Attends church or other social activities . Performs ADL's independently . Performs IADL's independently . Calls provider office for new concerns or questions   Please see past updates related to this goal by clicking on the "Past Updates" button in the selected goal      . "I'll have my son check my BP" (pt-stated)       Current Barriers:  . Knowledge Deficits related to Hypertension disease process, potential complications related to uncontrolled BP and importance of Self monitoring BP   Nurse Case Manager Clinical Goal(s):  . Over the next 90 days, patient will verbalize understanding of plan for having son assist with Self monitoring her BP at home and logging her readings  CCM RN CM Interventions:  12/17/18 call completed with patient  . Evaluation of current treatment plan related to Hypertension and patient's adherence to plan as established by provider. . Provided education to patient re: hypertensive causes, clinical manifestations, prevention, complications, and medical management; Discussed target BP 130/80; educated patient on importance of Self monitoring her BP and recording her readings to help ensure her BP is consistently well controlled; patient admits she is unable to use the BP cuff due to poor dexterity but feels certain her son will help her monitor and record her readings, patient has a cuff at home . Reviewed medications with patient and discussed indication, dosage and frequency of her prescribed hypertensive medications; stressed importance of patient taking her medications exactly as prescribed at the same time everyday w/o missed doses . Discussed plans with patient for ongoing care management follow up and provided patient with direct contact  information for care management team . Provided patient with printed educational materials related to BP log   Patient Self Care Activities with son's assistance . Self administers medications as prescribed . Attends all scheduled provider appointments . Calls pharmacy for medication refills . Performs ADL's independently . Performs IADL's independently . Calls provider office for new concerns or questions  Initial goal documentation        The patient verbalized understanding of instructions provided today and declined a print copy of patient instruction materials.   Telephone follow up appointment with care management team member scheduled for: 01/13/19   , RN, BSN, CCM Care Management Coordinator [facility]  Direct Phone: [phone]    

## 2018-12-21 ENCOUNTER — Other Ambulatory Visit: Payer: Self-pay | Admitting: Nurse Practitioner

## 2018-12-21 DIAGNOSIS — I10 Essential (primary) hypertension: Secondary | ICD-10-CM

## 2018-12-24 ENCOUNTER — Other Ambulatory Visit: Payer: Self-pay | Admitting: Nurse Practitioner

## 2018-12-24 ENCOUNTER — Ambulatory Visit: Payer: Medicare HMO | Admitting: Cardiovascular Disease

## 2018-12-24 DIAGNOSIS — M6281 Muscle weakness (generalized): Secondary | ICD-10-CM

## 2018-12-27 ENCOUNTER — Telehealth (HOSPITAL_COMMUNITY): Payer: Self-pay | Admitting: Radiology

## 2018-12-27 NOTE — Telephone Encounter (Signed)
Tried to return pt's call about her HBP concerns and treatment schedule. Will continue to try pt. JM

## 2019-01-03 ENCOUNTER — Ambulatory Visit: Payer: Self-pay | Admitting: Pharmacist

## 2019-01-03 DIAGNOSIS — E119 Type 2 diabetes mellitus without complications: Secondary | ICD-10-CM

## 2019-01-03 DIAGNOSIS — Z794 Long term (current) use of insulin: Secondary | ICD-10-CM

## 2019-01-03 DIAGNOSIS — I10 Essential (primary) hypertension: Secondary | ICD-10-CM

## 2019-01-03 NOTE — Patient Instructions (Signed)
Visit Information  Goals Addressed            This Visit's Progress     Patient Stated   . I need help paying for my medications (pt-stated)       Current Barriers:  . Financial Barriers: patient has [facility] and reports copay for [employer] ($134/month) is cost prohibitive at this time  Pharmacist Clinical Goal(s):  Marland Kitchen Over the next 30 days, patient will work with PharmD and providers to relieve medication access concerns  Interventions: . Comprehensive medication review completed; medication list updated in electronic medical record.  Lawana Chambers by [employer]: Patient meets income/3% out of pocket spend criteria for this medication's patient assistance program. Reviewed application process. Patient will provide proof of income, out of pocket spend report, and will sign application. Will collaborate with primary care provider, Arnette Felts, DNP, FNP for their portion of application. Once completed, will submit to [facility] patient assistance program. . Patient states she did not receive application.   Soliqua application mailed per patient request. . Patient states she did would like to fill out application after PCP appt next week. Will see patient on 01/08/19  Patient Self Care Activities:  . Patient will provide necessary portions of application   Please see past updates related to this goal by clicking on the "Past Updates" button in the selected goal       . I need to control & monitor my blood sugar (pt-stated)       Current Barriers:  . Diabetes: T2DM; most recent A1c 11.3% on 11/27/18  (A1c has decreased from 12.3% on 10/07/18--> patient motivated to decrease A1c towards goal  Patient states her diet has not been ideal,but has improved since we last spoke as her son si continuing to prepare appropriate meals.   . Current antihyperglycemic regimen: Soliqua 25 units daily (reports compliance) . Denies hypoglycemic symptoms . Reports hyperglycemic symptoms: headaches,  polydipsia . Current meal patterns: son is cooking lower carbohydrate meals o Drinks: sugary drinks, trying to drink more water . Current exercise: trying to walk . Current blood glucose readings: FBG 180-200s . Cardiovascular risk reduction: o Current hypertensive regimen: amlodipine (now taking), carvedilol, telmisartan/hctz o Current hyperlipidemia regimen: n/a-->statin intolerance (appt with lipid clinic to arrange Repatha) o Reinforce importance of daily aspirin.  Patient not taking as presribed.  She is now taking Brilinta.  She has "failed Plavix" per patient and clinical documentation o Will follow up with patient at clinic visit on 01/08/2019  Pharmacist Clinical Goal(s):  Marland Kitchen Over the next 90 days, patient with work with PharmD and primary care provider to address optimized medication & disease state management (diabetes).  Interventions: . Comprehensive medication review performed, medication list updated in electronic medical record . Reviewed medication fill history via insurance claims data.  . Reviewed & discussed the following diabetes-related information with patient: o Continue checking blood sugars as directed o Follow ADA recommended "diabetes-friendly" diet  (reviewed healthy snack/food options).  Mailed diabetes materials and snack food options.  Suggested low/zero sugar drinks for patient to try.  She states she is having trouble just drinking water. o Discussed insulin/GLP-1 injection technique/instructions. o Reviewed medication purpose/side effects-->patient denies adverse events, denies hypoglycemia o Continue taking all medications as prescribed by provider  Patient Self Care Activities:  . Patient will check blood glucose daily , document, and provide at future appointments . Patient will focus on medication adherence by taking all medications as prescribed. . Patient will take medications as prescribed .  Patient will contact provider with any episodes of  hypoglycemia . Patient will report any questions or concerns to provider    Please see past updates related to this goal by clicking on the "Past Updates" button in the selected goal         The patient verbalized understanding of instructions provided today and declined a print copy of patient instruction materials.   The care management team will reach out to the patient again over the next 7 days.   SIGNATURE Regina Eck, PharmD, BCPS Clinical Pharmacist, [facility] [place]: [phone]

## 2019-01-03 NOTE — Progress Notes (Signed)
Chronic Care Management   Visit Note  01/03/2019 Name: Donna [name] MRN: [id number] DOB: [birth year]  Referred by: Minette Brine, FNP Reason for referral : Chronic Care Management   Donna [name] is a 68 y.o. year old female who is a primary care patient of Minette Brine, Jane. The CCM team was consulted for assistance with chronic disease management and care coordination needs related to HTN and DMII  Review of patient status, including review of consultants reports, relevant laboratory and other test results, and collaboration with appropriate care team members and the patient's provider was performed as part of comprehensive patient evaluation and provision of chronic care management services.    I spoke with [name] by telephone today.  Advanced Directives Status: N See Care Plan and [employer] application for related entries.   Medications: Outpatient Encounter Medications as of 01/03/2019  Medication Sig Note  . amLODipine (NORVASC) 10 MG tablet Take 1 tablet by mouth once daily   . aspirin EC 81 MG tablet Take 81 mg by mouth every evening.   . blood glucose meter kit and supplies KIT Dispense based on patient and insurance preference. Use up to four times daily as directed. (FOR ICD-9 250.00, 250.01). 06/13/2018: Bought ReliON meter from [place]    . Calcium Carbonate-Vitamin D (CALCIUM-D PO) Take 2 tablets by mouth daily at 12 noon.    . carvedilol (COREG) 6.25 MG tablet Take 1 tablet (6.25 mg total) by mouth 2 (two) times daily.   Wyvonnia Dusky Seal Complex CAPS Take 3 capsules by mouth daily at 12 noon.   . Insulin Glargine-Lixisenatide (SOLIQUA) 100-33 UNT-MCG/ML SOPN Inject 20 Units into the skin at bedtime. (Patient taking differently: Inject 25 Units into the skin at bedtime. )   . Multiple Vitamin (MULTIVITAMIN PO) Take 1 tablet by mouth daily at 12 noon.    . Omega-3 Fatty Acids (OMEGA-3 PLUS PO) Take 2 capsules by mouth daily at 12 noon. 12/17/2018: Patient will refill    . telmisartan-hydrochlorothiazide (MICARDIS HCT) 40-12.5 MG tablet Take 1 tablet by mouth once daily (Patient taking differently: Take 1 tablet by mouth daily. )   . ticagrelor (BRILINTA) 90 MG TABS tablet Take 90 mg by mouth 2 (two) times daily. Patient has not picked it up yet   . vitamin C (ASCORBIC ACID) 250 MG tablet Take 500 mg by mouth daily at 12 noon.    No facility-administered encounter medications on file as of 01/03/2019.      Objective:   Goals Addressed            This Visit's Progress     Patient Stated   . I need help paying for my medications (pt-stated)       Current Barriers:  . Financial Barriers: patient has [employer] and reports copay for [employer] ($134/month) is cost prohibitive at this time  Pharmacist Clinical Goal(s):  Marland Kitchen Over the next 30 days, patient will work with PharmD and providers to relieve medication access concerns  Interventions: . Comprehensive medication review completed; medication list updated in electronic medical record.  [name] by [employer]: Patient meets income/3% out of pocket spend criteria for this medication's patient assistance program. Reviewed application process. Patient will provide proof of income, out of pocket spend report, and will sign application. Will collaborate with primary care provider, Minette Brine, DNP, FNP for their portion of application. Once completed, will submit to [facility] patient assistance program. . Patient states she did not receive application.   [name]  application mailed per patient request. . Patient states she did would like to fill out application after PCP appt next week. Will see patient on 01/08/19  Patient Self Care Activities:  . Patient will provide necessary portions of application   Please see past updates related to this goal by clicking on the "Past Updates" button in the selected goal       . I need to control & monitor my blood sugar (pt-stated)       Current Barriers:  .  Diabetes: T2DM; most recent A1c 11.3% on 11/27/18  (A1c has decreased from 12.3% on 10/07/18--> patient motivated to decrease A1c towards goal  Patient states her diet has not been ideal,but has improved since we last spoke as her son si continuing to prepare appropriate meals.   . Current antihyperglycemic regimen: Soliqua 25 units daily (reports compliance) . Denies hypoglycemic symptoms . Reports hyperglycemic symptoms: headaches, polydipsia . Current meal patterns: son is cooking lower carbohydrate meals o Drinks: sugary drinks, trying to drink more water . Current exercise: trying to walk . Current blood glucose readings: FBG 180-200s . Cardiovascular risk reduction: o Current hypertensive regimen: amlodipine (now taking), carvedilol, telmisartan/hctz o Current hyperlipidemia regimen: n/a-->statin intolerance (appt with lipid clinic to arrange Repatha) o Reinforce importance of daily aspirin.  Patient not taking as presribed.  She is now taking Brilinta.  She has "failed Plavix" per patient and clinical documentation o Will follow up with patient at clinic visit on 01/08/2019  Pharmacist Clinical Goal(s):  Marland Kitchen Over the next 90 days, patient with work with PharmD and primary care provider to address optimized medication & disease state management (diabetes).  Interventions: . Comprehensive medication review performed, medication list updated in electronic medical record . Reviewed medication fill history via insurance claims data.  . Reviewed & discussed the following diabetes-related information with patient: o Continue checking blood sugars as directed o Follow ADA recommended "diabetes-friendly" diet  (reviewed healthy snack/food options).  Mailed diabetes materials and snack food options.  Suggested low/zero sugar drinks for patient to try.  She states she is having trouble just drinking water. o Discussed insulin/GLP-1 injection technique/instructions. o Reviewed medication purpose/side  effects-->patient denies adverse events, denies hypoglycemia o Continue taking all medications as prescribed by provider  Patient Self Care Activities:  . Patient will check blood glucose daily , document, and provide at future appointments . Patient will focus on medication adherence by taking all medications as prescribed. . Patient will take medications as prescribed . Patient will contact provider with any episodes of hypoglycemia . Patient will report any questions or concerns to provider    Please see past updates related to this goal by clicking on the "Past Updates" button in the selected goal         Plan:   The care management team will reach out to the patient again over the next 7 days.   Provider Signature Regina Eck, PharmD, BCPS Clinical Pharmacist, [facility] [place]: [phone]

## 2019-01-08 ENCOUNTER — Other Ambulatory Visit: Payer: Self-pay

## 2019-01-08 ENCOUNTER — Ambulatory Visit: Payer: Medicare HMO | Admitting: Pharmacist

## 2019-01-08 ENCOUNTER — Ambulatory Visit (INDEPENDENT_AMBULATORY_CARE_PROVIDER_SITE_OTHER): Payer: Medicare HMO | Admitting: Nurse Practitioner

## 2019-01-08 ENCOUNTER — Ambulatory Visit: Payer: Medicare Other

## 2019-01-08 ENCOUNTER — Ambulatory Visit (INDEPENDENT_AMBULATORY_CARE_PROVIDER_SITE_OTHER): Payer: Medicare HMO

## 2019-01-08 ENCOUNTER — Encounter: Payer: Self-pay | Admitting: Nurse Practitioner

## 2019-01-08 VITALS — BP 144/84 | HR 72 | Temp 98.2°F | Ht 60.6 in | Wt 160.0 lb

## 2019-01-08 DIAGNOSIS — I10 Essential (primary) hypertension: Secondary | ICD-10-CM

## 2019-01-08 DIAGNOSIS — E119 Type 2 diabetes mellitus without complications: Secondary | ICD-10-CM

## 2019-01-08 DIAGNOSIS — Z23 Encounter for immunization: Secondary | ICD-10-CM

## 2019-01-08 DIAGNOSIS — Z0000 Encounter for general adult medical examination without abnormal findings: Secondary | ICD-10-CM | POA: Diagnosis not present

## 2019-01-08 DIAGNOSIS — Z794 Long term (current) use of insulin: Secondary | ICD-10-CM

## 2019-01-08 DIAGNOSIS — E785 Hyperlipidemia, unspecified: Secondary | ICD-10-CM

## 2019-01-08 LAB — POCT UA - MICROALBUMIN
Creatinine, POC: 200 mg/dL
Microalbumin Ur, POC: 80 mg/L

## 2019-01-08 MED ORDER — PREVNAR 13 IM SUSP: 0.5000 mL | INTRAMUSCULAR | 0 refills | Status: AC

## 2019-01-08 NOTE — Progress Notes (Signed)
Subjective:   Donna [name] is a 68 y.o. female who presents for Medicare Annual (Subsequent) preventive examination.  Review of Systems:  n/a Cardiac Risk Factors include: advanced age (>7mn, >>66women);diabetes mellitus;dyslipidemia;hypertension;sedentary lifestyle;obesity (BMI >30kg/m2)     Objective:     Vitals: BP (!) 144/84 (BP Location: Left Arm, Patient Position: Sitting, Cuff Size: Normal)   Pulse 72   Temp 98.2 F (36.8 C) (Oral)   Ht 5' 0.6" (1.539 m)   Wt 160 lb (72.6 kg)   SpO2 98%   BMI 30.63 kg/m   Body mass index is 30.63 kg/m.  Advanced Directives 01/08/2019 10/29/2018 10/18/2018 05/06/2018 01/02/2018 07/07/2015 12/17/2013  Does Patient Have a Medical Advance Directive? Yes No No No No No No  Type of [place]ive [place]? No - copy requested - - - - - -  Would patient like information on creating a medical advance directive? - No - Patient declined No - Patient declined Yes (MAU/Ambulatory/Procedural Areas - Information given) No - Patient declined No - patient declined information Yes -Higher education careers advisergiven    Tobacco Social History   Tobacco Use  Smoking Status Never Smoker  Smokeless Tobacco Never Used     Counseling given: Not Answered   Clinical Intake:  Pre-visit preparation completed: Yes  Pain : No/denies pain     Nutritional Status: BMI > 30  Obese Nutritional Risks: None Diabetes: Yes CBG done?: No Did pt. bring in CBG monitor from home?: No  How often do you need to have someone help you when you read instructions, pamphlets, or other written materials from your doctor or pharmacy?: 1 - Never What is the last grade level you completed in school?: 12th grade  Interpreter Needed?: No  Information entered by :: NAllen LPN  Past Medical History:  Diagnosis Date  . Anemia    low iron - not since having fibroids removed  . Arthritis   .  [facility] [place]   . Hyperlipidemia   . Hypertension   . Sleep apnea    just recently diagnosed with it, has not gotten the Cpap (done 10/29/18)  . Stroke ([facility]    weakness on left side   Past Surgical History:  Procedure Laterality Date  . COLONOSCOPY    . IR ANGIO INTRA EXTRACRAN SEL COM CAROTID INNOMINATE BILAT MOD SED  10/18/2018  . IR ANGIO VERTEBRAL SEL VERTEBRAL BILAT MOD SED  10/18/2018  . IR [place] ACCESS RIGHT  10/18/2018  . MYOMECTOMY     Family History  Problem Relation Age of Onset  . Stroke Mother   . Diabetes Father   . Diabetes Sister   . Breast cancer Neg Hx    Social History   Socioeconomic History  . Marital status: Legally Separated    Spouse name: Not on file  . Number of children: Not on file  . Years of education: Not on file  . Highest education level: Not on file  Occupational History  . Occupation: retired  SScientific laboratory technician . Financial resource strain: Not hard at all  . Food insecurity    Worry: Never true    Inability: Never true  . Transportation needs    Medical: No    Non-medical: No  Tobacco Use  . Smoking status: Never Smoker  . Smokeless tobacco: Never Used  Substance and Sexual Activity  .  Alcohol use: No  . Drug use: No  . Sexual activity: Not Currently  Lifestyle  . Physical activity    Days per week: 0 days    Minutes per session: 0 min  . Stress: Not at all  Relationships  . Social Herbalist on phone: Not on file    Gets together: Not on file    Attends religious service: Not on file    Active member of club or organization: Not on file    Attends meetings of clubs or organizations: Not on file    Relationship status: Not on file  Other Topics Concern  . Not on file  Social History Narrative   Lives at home alone   Right handed    Outpatient Encounter Medications as of 01/08/2019  Medication Sig  . amLODipine (NORVASC) 10 MG tablet Take 1 tablet by mouth once daily  .  aspirin EC 81 MG tablet Take 81 mg by mouth every evening.  . blood glucose meter kit and supplies KIT Dispense based on patient and insurance preference. Use up to four times daily as directed. (FOR ICD-9 250.00, 250.01).  . Calcium Carbonate-Vitamin D (CALCIUM-D PO) Take 2 tablets by mouth daily at 12 noon.   . carvedilol (COREG) 6.25 MG tablet Take 1 tablet (6.25 mg total) by mouth 2 (two) times daily.  . Insulin Glargine-Lixisenatide (SOLIQUA) 100-33 UNT-MCG/ML SOPN Inject 20 Units into the skin at bedtime. (Patient taking differently: Inject 25 Units into the skin at bedtime. )  . Multiple Vitamin (MULTIVITAMIN PO) Take 1 tablet by mouth daily at 12 noon.   . Omega-3 Fatty Acids (OMEGA-3 PLUS PO) Take 2 capsules by mouth daily at 12 noon.  Marland Kitchen telmisartan-hydrochlorothiazide (MICARDIS HCT) 40-12.5 MG tablet Take 1 tablet by mouth once daily (Patient taking differently: Take 1 tablet by mouth daily. )  . ticagrelor (BRILINTA) 90 MG TABS tablet Take 90 mg by mouth 2 (two) times daily. Patient has not picked it up yet  . vitamin C (ASCORBIC ACID) 250 MG tablet Take 500 mg by mouth daily at 12 noon.  . [DISCONTINUED] Echinacea-Golden Seal Complex CAPS Take 3 capsules by mouth daily at 12 noon.  . pneumococcal 13-valent conjugate vaccine (PREVNAR 13) SUSP injection Inject 0.5 mLs into the muscle tomorrow at 10 am for 1 dose.   No facility-administered encounter medications on file as of 01/08/2019.     Activities of Daily Living In your present state of health, do you have any difficulty performing the following activities: 01/08/2019 11/27/2018  Hearing? N N  Vision? Y N  Comment can't see close up -  Difficulty concentrating or making decisions? Y N  Comment forgetfulness -  Walking or climbing stairs? N N  Dressing or bathing? N N  Doing errands, shopping? N -  Preparing Food and eating ? N -  Using the Toilet? N -  In the past six months, have you accidently leaked urine? N -  Do you  have problems with loss of bowel control? N -  Managing your Medications? N -  Managing your Finances? N -  Housekeeping or managing your Housekeeping? N -  Some recent data might be hidden    Patient Care Team: Minette Brine, FNP as PCP - General (Bruno) Warden Fillers, MD as Consulting Physician (Ophthalmology) Rex Kras, Claudette Stapler, RN as Case Manager Little, Claudette Stapler, RN as Case Manager Lavera Guise, [facility] (Pharmacist)    Assessment:   This is a  routine wellness examination for Keeley.  Exercise Activities and Dietary recommendations Current Exercise Habits: The patient does not participate in regular exercise at present  Goals    . "I have arthritis pain in my hands, knees and legs" (pt-stated)     Current Barriers:  Marland Kitchen Knowledge Deficits related to diagnosis and treatment management for arthritis . Impaired Physical Mobility  . Impaired Dexterity   Nurse Case Manager Clinical Goal(s):  Marland Kitchen Over the next 60 days, patient will work with CCM RN CM and PCP to address needs related to arthritis pain management  . Over the next 60 days, patient will work with in home PT to establish a safe and effective HEP to help improve physical mobility, ROM, balance, strengthening and overall stamina as evidence by patient will have increased ability to perform Self Care.   CCM RN CM Interventions:  12/17/18 spoke with patient  . Evaluation of current treatment plan related to arthritis pain and decreased dexterity and patient's adherence to plan as established by provider; discussed patient is unsure of what type of arthritis she has, reports her sister has RA, she has not been tested; reports having pain in her hands, knees and legs with decreased ROM and poor dexterity . Provided education to patient re: the benefits of receiving in home PT to assist with ROM, improve physical mobility, dexterity, and strengthening  . Collaborated with provider Minette Brine, FNP regarding request  to evaluate for RA at next OV; requested in home PT referral  . Discussed plans with patient for ongoing care management follow up and provided patient with direct contact information for care management team  Patient Self Care Activities with son's assistance  . Self administers medications as prescribed . Attends all scheduled provider appointments . Calls pharmacy for medication refills . Performs ADL's independently . Performs IADL's independently . Calls provider office for new concerns or questions  Initial goal documentation     . "I know I need some help with my sugar" (pt-stated)     Current Barriers:  Marland Kitchen Knowledge Deficits related to long term plan for management of DM . Knowledge Deficits related to recommended diet/exercise plan . Non-adherence to Self monitoring CBG's   Nurse Case Manager Clinical Goal(s): Over the next 90 days, patient will work with [facility] to establish long term plan of care and self health management strategies for DM disease management. Goal Not Met . 12/18/18: Over the next 90 days, patient will work with the CCM team to develop a Diabetes disease management plan to improve her knowledge and understanding of how to improve Self Health management of DM.   CCM RN CM Interventions:  12/17/18 call completed with patient  . Evaluation of current treatment plan related to Diabetes Mellitus and patient's adherence to plan as established by provider. . Provided education to patient re: most recent A1C of 11.3 obtained on 11/27/18; discussed target A1C <7.0; discussed potential complications of uncontrolled DM; discussed Self Health management to lower A1C including adherence to following ADA diet, implementing routine exercise and taking diabetic medications exactly as prescribed . Reviewed medications with patient and discussed indication, dosage and frequency of prescribed medications; patient denies questions at this time, no med discrepancies noted at this  time . Discussed plans with patient for ongoing care management follow up and provided patient with direct contact information for care management team . Provided patient with printed educational materials related to Diabetes Management; Diabetes Zone Tool, signs/symptoms Hypo/Hyperglycemia; Blood sugar log . Advised patient,  providing education and rationale, to check cbg daily before meals and record, calling the CCM team and or PCP for findings outside established parameters.  <80 and or >250 . Discussed patient is not checking her CBG's; she reports working with embedded Pharm D Lottie Dawson to obtain a [employer]   Patient Self Care Activities with son's assistance . Self administers medications as prescribed . Attends all scheduled provider appointments . Calls pharmacy for medication refills . Attends church or other social activities . Performs ADL's independently . Performs IADL's independently . Calls provider office for new concerns or questions   Please see past updates related to this goal by clicking on the "Past Updates" button in the selected goal      . "I need help getting my medicine straightened out" (pt-stated)     Current Barriers:  Marland Kitchen Knowledge Deficits related to long term management of medications . Financial restraints; out of pocket cost for Xultrophy is $350 per 30 day supply   Nurse Case Manager Clinical Goal(s):  Marland Kitchen Over the next 30 days, patient will work with [facility] and/or pharmacist to establish long term plan for self management of prescribed medications  Interventions:   Face to Face CCM initial visit completed today . Evaluation of current treatment plan related to DM and HTN; assessed for adherence to plan as established by provider. . Reviewed medications with patient; medication reconciliation completed  Referral to clinical pharmacist to assist with cost of Xultrophy via in basket message  Collaborated with Minette Brine, FNP re: how  to instruct pt to take Xultrophy following a missed dose  Instructed patient to monitor CBG the am following her missed nighttime dose and if in acceptable range (above 100), pt should take Xultrophy as prescribed  Scheduled telephone follow-up call with patient  Patient Self Care Activities:   Verbalizes understanding of instructions/information provided today  . Self administers medications   Plan:  Follow up to ensure clinical pharmacist Lottie Dawson is able to assist patient with resources that may help with lowering the cost of Xultrophy    Please see past updates related to this goal by clicking on the "Past Updates" button in the selected goal      . "I'll have my son check my BP" (pt-stated)     Current Barriers:  Marland Kitchen Knowledge Deficits related to Hypertension disease process, potential complications related to uncontrolled BP and importance of Self monitoring BP   Nurse Case Manager Clinical Goal(s):  Marland Kitchen Over the next 90 days, patient will verbalize understanding of plan for having son assist with Self monitoring her BP at home and logging her readings  CCM RN CM Interventions:  12/17/18 call completed with patient  . Evaluation of current treatment plan related to Hypertension and patient's adherence to plan as established by provider. . Provided education to patient re: hypertensive causes, clinical manifestations, prevention, complications, and medical management; Discussed target BP 130/80; educated patient on importance of Self monitoring her BP and recording her readings to help ensure her BP is consistently well controlled; patient admits she is unable to use the BP cuff due to poor dexterity but feels certain her son will help her monitor and record her readings, patient has a cuff at home . Reviewed medications with patient and discussed indication, dosage and frequency of her prescribed hypertensive medications; stressed importance of patient taking her medications  exactly as prescribed at the same time everyday w/o missed doses . Discussed plans with patient for ongoing  care management follow up and provided patient with direct contact information for care management team . Provided patient with printed educational materials related to BP log   Patient Self Care Activities with son's assistance . Self administers medications as prescribed . Attends all scheduled provider appointments . Calls pharmacy for medication refills . Performs ADL's independently . Performs IADL's independently . Calls provider office for new concerns or questions  Initial goal documentation     . Exercise 150 min/wk Moderate Activity (pt-stated)    . I need help paying for my medications (pt-stated)     Current Barriers:  . Financial Barriers: patient has [employer] and reports copay for [employer] ($134/month) is cost prohibitive at this time  Pharmacist Clinical Goal(s):  Marland Kitchen Over the next 30 days, patient will work with PharmD and providers to relieve medication access concerns  Interventions: . Comprehensive medication review completed; medication list updated in electronic medical record.  [name] by [employer]: Patient meets income/3% out of pocket spend criteria for this medication's patient assistance program. Reviewed application process. Patient will provide proof of income, out of pocket spend report, and will sign application. Will collaborate with primary care provider, Minette Brine, DNP, FNP for their portion of application. Once completed, will submit to [facility] patient assistance program. . Patient states she did not receive application.   [place] application mailed per patient request. . Patient states she did would like to fill out application after PCP appt next week. Will see patient on 01/08/19  Patient Self Care Activities:  . Patient will provide necessary portions of application   Please see past updates related to this goal by clicking on the  "Past Updates" button in the selected goal       . I need to control & monitor my blood sugar (pt-stated)     Current Barriers:  . Diabetes: T2DM; most recent A1c 11.3% on 11/27/18  (A1c has decreased from 12.3% on 10/07/18--> patient motivated to decrease A1c towards goal  Patient states her diet has not been ideal,but has improved since we last spoke as her son si continuing to prepare appropriate meals.   . Current antihyperglycemic regimen: Soliqua 25 units daily (reports compliance) . Denies hypoglycemic symptoms . Reports hyperglycemic symptoms: headaches, polydipsia . Current meal patterns: son is cooking lower carbohydrate meals o Drinks: sugary drinks, trying to drink more water . Current exercise: trying to walk . Current blood glucose readings: FBG 180-200s . Cardiovascular risk reduction: o Current hypertensive regimen: amlodipine (now taking), carvedilol, telmisartan/hctz o Current hyperlipidemia regimen: n/a-->statin intolerance (appt with lipid clinic to arrange Repatha) o Reinforce importance of daily aspirin.  Patient not taking as presribed.  She is now taking Brilinta.  She has "failed Plavix" per patient and clinical documentation o Will follow up with patient at clinic visit on 01/08/2019  Pharmacist Clinical Goal(s):  Marland Kitchen Over the next 90 days, patient with work with PharmD and primary care provider to address optimized medication & disease state management (diabetes).  Interventions: . Comprehensive medication review performed, medication list updated in electronic medical record . Reviewed medication fill history via insurance claims data.  . Reviewed & discussed the following diabetes-related information with patient: o Continue checking blood sugars as directed o Follow ADA recommended "diabetes-friendly" diet  (reviewed healthy snack/food options).  Mailed diabetes materials and snack food options.  Suggested low/zero sugar drinks for patient to try.  She states she  is having trouble just drinking water. o Discussed insulin/GLP-1 injection technique/instructions. o  Reviewed medication purpose/side effects-->patient denies adverse events, denies hypoglycemia o Continue taking all medications as prescribed by provider  Patient Self Care Activities:  . Patient will check blood glucose daily , document, and provide at future appointments . Patient will focus on medication adherence by taking all medications as prescribed. . Patient will take medications as prescribed . Patient will contact provider with any episodes of hypoglycemia . Patient will report any questions or concerns to provider    Please see past updates related to this goal by clicking on the "Past Updates" button in the selected goal      . Patient Stated     01/08/2019, to get procedure to unblock artery    . Patient unsure how to take BP med     Current Barriers:  Marland Kitchen Knowledge Deficits related to understanding of how to take BP med   Nurse Case Manager Clinical Goal(s):  Marland Kitchen Over the next 30 days, patient will demonstrate improved adherence to prescribed treatment plan for Hypertension as evidenced by patient will verbalize taking her BP medication as prescribed; BP will be WNL   Interventions:  . Collaborated with Minette Brine, FNP  regarding Benicar/dose frequency. . Medication reconciliation completed, patient verbalizes understanding about how to take BP med . Scheduled CCM follow-up call with patient    Patient Self Care Activities:   Verbalizes understanding of instructions/information provided by [facility] . Self administers medications as prescribed . Calls pharmacy for medication refills . Calls provider office for new concerns or questions  Plan:  . RNCM will Telephone CCM follow-up call scheduled . RNCM will assess for adherence with taking BP medication exactly as prescribed . Patient will RNCM will encouraged patient to come into PCP office for BP check in 1-2 weeks   *initial goal documentation         Fall Risk Fall Risk  01/08/2019 11/28/2018 10/07/2018 08/29/2018 07/08/2018  Falls in the past year? 1 0 0 1 1  Comment lost balance while working, determined had stroke - - - -  Number falls in past yr: 1 - - 0 0  Injury with Fall? 0 - - 0 0  Risk for fall due to : History of fall(s);Medication side effect - - - -  Follow up Falls evaluation completed;Education provided;Falls prevention discussed - - - -   Is the patient's home free of loose throw rugs in walkways, pet beds, electrical cords, etc?   yes      Grab bars in the bathroom? no      Handrails on the stairs?   n/a      Adequate lighting?   yes  Timed Get Up and Go performed: n/a  Depression Screen PHQ 2/9 Scores 01/08/2019 11/28/2018 10/07/2018 07/08/2018  PHQ - 2 Score 0 0 0 0  PHQ- 9 Score 0 - - -     Cognitive Function     6CIT Screen 01/08/2019 01/02/2018  What Year? 0 points 0 points  What month? 0 points 0 points  What time? 0 points 0 points  Count back from 20 0 points 0 points  Months in reverse 0 points 0 points  Repeat phrase 0 points 0 points  Total Score 0 0    Immunization History  Administered Date(s) Administered  . Influenza, High Dose Seasonal PF 11/28/2018    Qualifies for Shingles Vaccine? yes  Screening Tests Health Maintenance  Topic Date Due  . TETANUS/TDAP  06/24/1970  . DEXA SCAN  06/23/2016  . PNA  vac Low Risk Adult (1 of 2 - PCV13) 06/23/2016  . OPHTHALMOLOGY EXAM  03/13/2019  . HEMOGLOBIN A1C  05/27/2019  . FOOT EXAM  11/28/2019  . MAMMOGRAM  12/11/2019  . COLONOSCOPY  04/10/2024  . INFLUENZA VACCINE  Completed  . Hepatitis C Screening  Completed    Cancer Screenings: Lung: Low Dose CT Chest recommended if Age 75-80 years, 30 pack-year currently smoking OR have quit w/in 15years. Patient does not qualify. Breast:  Up to date on Mammogram? No   Up to date of Bone Density/Dexa? No Colorectal: up to date  Additional Screenings: :  Hepatitis C Screening: 12/2017     Plan:    Patient wants to get procedure done to unblock arteries.    I have personally reviewed and noted the following in the patient's chart:   . Medical and social history . Use of alcohol, tobacco or illicit drugs  . Current medications and supplements . Functional ability and status . Nutritional status . Physical activity . Advanced directives . List of other physicians . Hospitalizations, surgeries, and ER visits in previous 12 months . Vitals . Screenings to include cognitive, depression, and falls . Referrals and appointments  In addition, I have reviewed and discussed with patient certain preventive protocols, quality metrics, and best practice recommendations. A written personalized care plan for preventive services as well as general preventive health recommendations were provided to patient.     Kellie Simmering, LPN  [id number]

## 2019-01-08 NOTE — Progress Notes (Signed)
Subjective:     Patient ID: Donna [name] , female    DOB: 1951/01/25 , 68 y.o.   MRN: [id number]   Chief Complaint  Patient presents with  . Diabetes  . Hypertension    HPI  She is awaiting her procedure with Dr. Bronson Curb and they are waiting on her to have a better controlled blood pressure.    Hypertension This is a chronic problem. The current episode started more than 1 year ago. The problem has been gradually improving since onset. Condition status: improving. Pertinent negatives include no anxiety or palpitations. There are no associated agents to hypertension. Risk factors for coronary artery disease include obesity, sedentary lifestyle, diabetes mellitus and dyslipidemia. Past treatments include calcium channel blockers and beta blockers. The current treatment provides moderate improvement.     Past Medical History:  Diagnosis Date  . Anemia    low iron - not since having fibroids removed  . Arthritis   . Diabetes mellitus without complication ([place])   . Hyperlipidemia   . Hypertension   . Sleep apnea    just recently diagnosed with it, has not gotten the Cpap (done 10/29/18)  . Stroke [facility])    weakness on left side     Family History  Problem Relation Age of Onset  . Stroke Mother   . Diabetes Father   . Diabetes Sister   . Breast cancer Neg Hx      Current Outpatient Medications:  .  amLODipine (NORVASC) 10 MG tablet, Take 1 tablet by mouth once daily, Disp: 90 tablet, Rfl: 0 .  aspirin EC 81 MG tablet, Take 81 mg by mouth every evening., Disp: , Rfl:  .  blood glucose meter kit and supplies KIT, Dispense based on patient and insurance preference. Use up to four times daily as directed. (FOR ICD-9 250.00, 250.01)., Disp: 1 each, Rfl: 0 .  Calcium Carbonate-Vitamin D (CALCIUM-D PO), Take 2 tablets by mouth daily at 12 noon. , Disp: , Rfl:  .  carvedilol (COREG) 6.25 MG tablet, Take 1 tablet (6.25 mg total) by mouth 2 (two) times daily., Disp: 180 tablet, Rfl:  3 .  Insulin Glargine-Lixisenatide (SOLIQUA) 100-33 UNT-MCG/ML SOPN, Inject 20 Units into the skin at bedtime. (Patient taking differently: Inject 25 Units into the skin at bedtime. ), Disp: 15 pen, Rfl: 0 .  Multiple Vitamin (MULTIVITAMIN PO), Take 1 tablet by mouth daily at 12 noon. , Disp: , Rfl:  .  Omega-3 Fatty Acids (OMEGA-3 PLUS PO), Take 2 capsules by mouth daily at 12 noon., Disp: , Rfl:  .  pneumococcal 13-valent conjugate vaccine (PREVNAR 13) SUSP injection, Inject 0.5 mLs into the muscle tomorrow at 10 am for 1 dose., Disp: 0.5 mL, Rfl: 0 .  telmisartan-hydrochlorothiazide (MICARDIS HCT) 40-12.5 MG tablet, Take 1 tablet by mouth once daily (Patient taking differently: Take 1 tablet by mouth daily. ), Disp: 90 tablet, Rfl: 0 .  ticagrelor (BRILINTA) 90 MG TABS tablet, Take 90 mg by mouth 2 (two) times daily. Patient has not picked it up yet, Disp: , Rfl:  .  vitamin C (ASCORBIC ACID) 250 MG tablet, Take 500 mg by mouth daily at 12 noon., Disp: , Rfl:    No Known Allergies   Review of Systems  Constitutional: Negative.   Respiratory: Negative.   Cardiovascular: Negative.  Negative for palpitations and leg swelling.  Musculoskeletal: Negative.   Neurological: Negative.   Psychiatric/Behavioral: Negative.  Negative for agitation.     Today's Vitals  01/08/19 1052  BP: (!) 144/84  Pulse: 72  Temp: 98.2 F (36.8 C)  TempSrc: Oral  Weight: 160 lb (72.6 kg)  Height: 5' 0.6" (1.539 m)   Body mass index is 30.63 kg/m.   Objective:  Physical Exam Constitutional:      Appearance: Normal appearance.  Cardiovascular:     Rate and Rhythm: Normal rate and regular rhythm.     Pulses: Normal pulses.     Heart sounds: Normal heart sounds. No murmur.  Pulmonary:     Effort: Pulmonary effort is normal. No respiratory distress.     Breath sounds: Normal breath sounds.  Skin:    Capillary Refill: Capillary refill takes less than 2 seconds.  Neurological:     General: No focal  deficit present.     Mental Status: She is alert and oriented to person, place, and time.         Assessment And Plan:     1. Type 2 diabetes mellitus without complication, with long-term current use of insulin (HCC)  Chronic, poorly controlled on Soliqua  Will check with [name] CCM pharmacist to see if can help with Xulotophy  2. Essential hypertension  Her blood pressure is better today but she continues to be slightly elevated.  She is awaiting for her blood pressure to improve before she can have her arteriogram.  I will reach out to Dr. Bronson Curb office to see what parameters he is looking for and for how long  She is having her blood pressure managed by cardiology  3. Hyperlipidemia, unspecified hyperlipidemia type Chronic, fair control     Minette Brine, FNP    THE PATIENT IS ENCOURAGED TO PRACTICE SOCIAL DISTANCING DUE TO THE COVID-19 PANDEMIC.

## 2019-01-08 NOTE — Addendum Note (Signed)
Addended by: Kellie Simmering on: 01/08/2019 02:11 PM   Modules accepted: Orders

## 2019-01-08 NOTE — Patient Instructions (Signed)
Donna [name] , Thank you for taking time to come for your Medicare Wellness Visit. I appreciate your ongoing commitment to your health goals. Please review the following plan we discussed and let me know if I can assist you in the future.   Screening recommendations/referrals: Colonoscopy: 03/2014 Mammogram: 11/2017 Bone Density: due Recommended yearly ophthalmology/optometry visit for glaucoma screening and checkup Recommended yearly dental visit for hygiene and checkup  Vaccinations: Influenza vaccine: 11/2018 Pneumococcal vaccine: sent to pharmacy Tdap vaccine: believes had within 10 years Shingles vaccine: discussed    Advanced directives: Please bring a copy of your POA (Power of [place]) and/or Living Will to your next appointment.    Conditions/risks identified: obesity  Next appointment: 01/08/2019   Preventive Care 67 Years and Older, Female Preventive care refers to lifestyle choices and visits with your health care provider that can promote health and wellness. What does preventive care include?  A yearly physical exam. This is also called an annual well check.  Dental exams once or twice a year.  Routine eye exams. Ask your health care provider how often you should have your eyes checked.  Personal lifestyle choices, including:  Daily care of your teeth and gums.  Regular physical activity.  Eating a healthy diet.  Avoiding tobacco and drug use.  Limiting alcohol use.  Practicing safe sex.  Taking low-dose aspirin every day.  Taking vitamin and mineral supplements as recommended by your health care provider. What happens during an annual well check? The services and screenings done by your health care provider during your annual well check will depend on your age, overall health, lifestyle risk factors, and family history of disease. Counseling  Your health care provider may ask you questions about your:  Alcohol use.  Tobacco use.  Drug use.   Emotional well-being.  Home and relationship well-being.  Sexual activity.  Eating habits.  History of falls.  Memory and ability to understand (cognition).  Work and work Statistician.  Reproductive health. Screening  You may have the following tests or measurements:  Height, weight, and BMI.  Blood pressure.  Lipid and cholesterol levels. These may be checked every 5 years, or more frequently if you are over 65 years old.  Skin check.  Lung cancer screening. You may have this screening every year starting at age 59 if you have a 30-pack-year history of smoking and currently smoke or have quit within the past 15 years.  Fecal occult blood test (FOBT) of the stool. You may have this test every year starting at age 42.  Flexible sigmoidoscopy or colonoscopy. You may have a sigmoidoscopy every 5 years or a colonoscopy every 10 years starting at age 36.  Hepatitis C blood test.  Hepatitis B blood test.  Sexually transmitted disease (STD) testing.  Diabetes screening. This is done by checking your blood sugar (glucose) after you have not eaten for a while (fasting). You may have this done every 1-3 years.  Bone density scan. This is done to screen for osteoporosis. You may have this done starting at age 63.  Mammogram. This may be done every 1-2 years. Talk to your health care provider about how often you should have regular mammograms. Talk with your health care provider about your test results, treatment options, and if necessary, the need for more tests. Vaccines  Your health care provider may recommend certain vaccines, such as:  Influenza vaccine. This is recommended every year.  Tetanus, diphtheria, and acellular pertussis (Tdap, Td) vaccine. You may  need a Td booster every 10 years.  Zoster vaccine. You may need this after age 70.  Pneumococcal 13-valent conjugate (PCV13) vaccine. One dose is recommended after age 30.  Pneumococcal polysaccharide (PPSV23)  vaccine. One dose is recommended after age 70. Talk to your health care provider about which screenings and vaccines you need and how often you need them. This information is not intended to replace advice given to you by your health care provider. Make sure you discuss any questions you have with your health care provider. Document Released: 03/26/2015 Document Revised: 11/17/2015 Document Reviewed: 12/29/2014 Elsevier Interactive Patient Education  2017 [place] Prevention in the Home Falls can cause injuries. They can happen to people of all ages. There are many things you can do to make your home safe and to help prevent falls. What can I do on the outside of my home?  Regularly fix the edges of walkways and driveways and fix any cracks.  Remove anything that might make you trip as you walk through a door, such as a raised step or threshold.  Trim any bushes or trees on the path to your home.  Use bright outdoor lighting.  Clear any walking paths of anything that might make someone trip, such as rocks or tools.  Regularly check to see if handrails are loose or broken. Make sure that both sides of any steps have handrails.  Any raised decks and porches should have guardrails on the edges.  Have any leaves, snow, or ice cleared regularly.  Use sand or salt on walking paths during winter.  Clean up any spills in your garage right away. This includes oil or grease spills. What can I do in the bathroom?  Use night lights.  Install grab bars by the toilet and in the tub and shower. Do not use towel bars as grab bars.  Use non-skid mats or decals in the tub or shower.  If you need to sit down in the shower, use a plastic, non-slip stool.  Keep the floor dry. Clean up any water that spills on the floor as soon as it happens.  Remove soap buildup in the tub or shower regularly.  Attach bath mats securely with double-sided non-slip rug tape.  Do not have throw rugs  and other things on the floor that can make you trip. What can I do in the bedroom?  Use night lights.  Make sure that you have a light by your bed that is easy to reach.  Do not use any sheets or blankets that are too big for your bed. They should not hang down onto the floor.  Have a firm chair that has side arms. You can use this for support while you get dressed.  Do not have throw rugs and other things on the floor that can make you trip. What can I do in the kitchen?  Clean up any spills right away.  Avoid walking on wet floors.  Keep items that you use a lot in easy-to-reach places.  If you need to reach something above you, use a strong step stool that has a grab bar.  Keep electrical cords out of the way.  Do not use floor polish or wax that makes floors slippery. If you must use wax, use non-skid floor wax.  Do not have throw rugs and other things on the floor that can make you trip. What can I do with my stairs?  Do not leave any items on  the stairs.  Make sure that there are handrails on both sides of the stairs and use them. Fix handrails that are broken or loose. Make sure that handrails are as long as the stairways.  Check any carpeting to make sure that it is firmly attached to the stairs. Fix any carpet that is loose or worn.  Avoid having throw rugs at the top or bottom of the stairs. If you do have throw rugs, attach them to the floor with carpet tape.  Make sure that you have a light switch at the top of the stairs and the bottom of the stairs. If you do not have them, ask someone to add them for you. What else can I do to help prevent falls?  Wear shoes that:  Do not have high heels.  Have rubber bottoms.  Are comfortable and fit you well.  Are closed at the toe. Do not wear sandals.  If you use a stepladder:  Make sure that it is fully opened. Do not climb a closed stepladder.  Make sure that both sides of the stepladder are locked into  place.  Ask someone to hold it for you, if possible.  Clearly mark and make sure that you can see:  Any grab bars or handrails.  First and last steps.  Where the edge of each step is.  Use tools that help you move around (mobility aids) if they are needed. These include:  Canes.  Walkers.  Scooters.  Crutches.  Turn on the lights when you go into a dark area. Replace any light bulbs as soon as they burn out.  Set up your furniture so you have a clear path. Avoid moving your furniture around.  If any of your floors are uneven, fix them.  If there are any pets around you, be aware of where they are.  Review your medicines with your doctor. Some medicines can make you feel dizzy. This can increase your chance of falling. Ask your doctor what other things that you can do to help prevent falls. This information is not intended to replace advice given to you by your health care provider. Make sure you discuss any questions you have with your health care provider. Document Released: 12/24/2008 Document Revised: 08/05/2015 Document Reviewed: 04/03/2014 Elsevier Interactive Patient Education  2017 [employer].

## 2019-01-13 ENCOUNTER — Telehealth: Payer: Medicare HMO

## 2019-01-13 ENCOUNTER — Ambulatory Visit (INDEPENDENT_AMBULATORY_CARE_PROVIDER_SITE_OTHER): Payer: Medicare HMO

## 2019-01-13 DIAGNOSIS — E11319 Type 2 diabetes mellitus with unspecified diabetic retinopathy without macular edema: Secondary | ICD-10-CM

## 2019-01-13 DIAGNOSIS — E785 Hyperlipidemia, unspecified: Secondary | ICD-10-CM | POA: Diagnosis not present

## 2019-01-13 DIAGNOSIS — I10 Essential (primary) hypertension: Secondary | ICD-10-CM

## 2019-01-13 DIAGNOSIS — E119 Type 2 diabetes mellitus without complications: Secondary | ICD-10-CM

## 2019-01-13 DIAGNOSIS — Z794 Long term (current) use of insulin: Secondary | ICD-10-CM

## 2019-01-14 ENCOUNTER — Telehealth: Payer: Medicare HMO

## 2019-01-14 DIAGNOSIS — E113393 Type 2 diabetes mellitus with moderate nonproliferative diabetic retinopathy without macular edema, bilateral: Secondary | ICD-10-CM | POA: Diagnosis not present

## 2019-01-14 DIAGNOSIS — H2513 Age-related nuclear cataract, bilateral: Secondary | ICD-10-CM | POA: Diagnosis not present

## 2019-01-14 LAB — HM DIABETES EYE EXAM

## 2019-01-15 NOTE — Chronic Care Management (AMB) (Signed)
Chronic Care Management   Follow Up Note   01/14/2019 Name: Donna [name] MRN: [id number] DOB: 1950/10/02  Referred by: Minette Brine, FNP Reason for referral : Chronic Care Management (CCM RNCM Telephone Follow up)   Donna [name] is a 68 y.o. year old female who is a primary care patient of Minette Brine, [place]. The CCM team was consulted for assistance with chronic disease management and care coordination needs.    Review of patient status, including review of consultants reports, relevant laboratory and other test results, and collaboration with appropriate care team members and the patient's provider was performed as part of comprehensive patient evaluation and provision of chronic care management services.    SDOH (Social Determinants of Health) screening performed today: None. See Care Plan for related entries.   Advanced Directives Status: N See Care Plan and [employer] application for related entries.  CCM RN CM outbound call placed to patient for a DM and in home PT follow up.    Outpatient Encounter Medications as of 01/13/2019  Medication Sig Note  . amLODipine (NORVASC) 10 MG tablet Take 1 tablet by mouth once daily   . aspirin EC 81 MG tablet Take 81 mg by mouth every evening.   . blood glucose meter kit and supplies KIT Dispense based on patient and insurance preference. Use up to four times daily as directed. (FOR ICD-9 250.00, 250.01). 06/13/2018: Bought Reli[employer] meter from [employer]    . Calcium Carbonate-Vitamin D (CALCIUM-D PO) Take 2 tablets by mouth daily at 12 noon.    . carvedilol (COREG) 6.25 MG tablet Take 1 tablet (6.25 mg total) by mouth 2 (two) times daily.   . Insulin Glargine-Lixisenatide (SOLIQUA) 100-33 UNT-MCG/ML SOPN Inject 20 Units into the skin at bedtime. (Patient taking differently: Inject 25 Units into the skin at bedtime. )   . Multiple Vitamin (MULTIVITAMIN PO) Take 1 tablet by mouth daily at 12 noon.    . Omega-3 Fatty Acids (OMEGA-3 PLUS PO) Take 2  capsules by mouth daily at 12 noon. 12/17/2018: Patient will refill   . telmisartan-hydrochlorothiazide (MICARDIS HCT) 40-12.5 MG tablet Take 1 tablet by mouth once daily (Patient taking differently: Take 1 tablet by mouth daily. )   . ticagrelor (BRILINTA) 90 MG TABS tablet Take 90 mg by mouth 2 (two) times daily. Patient has not picked it up yet   . vitamin C (ASCORBIC ACID) 250 MG tablet Take 500 mg by mouth daily at 12 noon.    No facility-administered encounter medications on file as of 01/13/2019.      Goals Addressed      Patient Stated   . "I have arthritis pain in my hands, knees and legs" (pt-stated)       Current Barriers:  Marland Kitchen Knowledge Deficits related to diagnosis and treatment management for arthritis . Impaired Physical Mobility  . Impaired Dexterity   Nurse Case Manager Clinical Goal(s):  Marland Kitchen Over the next 60 days, patient will work with CCM RN CM and PCP to address needs related to arthritis pain management  . Over the next 60 days, patient will work with in home PT to establish a safe and effective HEP to help improve physical mobility, ROM, balance, strengthening and overall stamina as evidence by patient will have increased ability to perform Self Care.   CCM RN CM Interventions:  01/14/19 spoke with patient  . Evaluation of current treatment plan related to arthritis pain and decreased dexterity and patient's adherence to plan as established by  provider; discussed patient is unsure of what type of arthritis she has, reports her sister has RA, she has not been tested; reports having pain in her hands, knees and legs with decreased ROM and poor dexterity . Assessed for start of in home PT; patient states she declined the service at this time but will consider in the future - she did not provide a reason as to why she wishes to decline services at this time . Discussed plans with patient for ongoing care management follow up and provided patient with direct contact information  for care management team  Patient Self Care Activities with son's assistance  . Self administers medications as prescribed . Attends all scheduled provider appointments . Calls pharmacy for medication refills . Performs ADL's independently . Performs IADL's independently . Calls provider office for new concerns or questions  Please see past updates related to this goal by clicking on the "Past Updates" button in the selected goal      . "I know I need some help with my sugar" (pt-stated)       Current Barriers:  Marland Kitchen Knowledge Deficits related to long term plan for management of DM . Knowledge Deficits related to recommended diet/exercise plan . Non-adherence to Self monitoring CBG's   Nurse Case Manager Clinical Goal(s): Over the next 90 days, patient will work with [facility] to establish long term plan of care and self health management strategies for DM disease management. Goal Not Met . 12/18/18: Over the next 90 days, patient will work with the CCM team to develop a Diabetes disease management plan to improve her knowledge and understanding of how to improve Self Health management of DM.   CCM RN CM Interventions:  01/14/19 call completed with patient   . Evaluation of current treatment plan related to Diabetes Mellitus and patient's adherence to plan as established by provider. . Discussed plans with patient for ongoing care management follow up and provided patient with direct contact information for care management team . Donna [name] reports she has received her [employer] and needs instruction on how to use this meter . Collaborated with Lottie Dawson Pharm D regarding the need for a face to face visit with patient to assist with use of her Freestyle Libre Meter-Discussed [name] will contact Donna [name] to schedule a face to face visit . Discussed Donna [name] will continue to self monitor her FBS manually until she is able to transition to the [employer] meter   Patient Self  Care Activities with son's assistance . Self administers medications as prescribed . Attends all scheduled provider appointments . Calls pharmacy for medication refills . Attends church or other social activities . Performs ADL's independently . Performs IADL's independently . Calls provider office for new concerns or questions   Please see past updates related to this goal by clicking on the "Past Updates" button in the selected goal      . "I'll have my son check my BP" (pt-stated)       Current Barriers:  Marland Kitchen Knowledge Deficits related to Hypertension disease process, potential complications related to uncontrolled BP and importance of Self monitoring BP   Nurse Case Manager Clinical Goal(s):  Marland Kitchen Over the next 90 days, patient will verbalize understanding of plan for having son assist with Self monitoring her BP at home and logging her readings . New - 01/14/19 Over the next 14 days, patient will meet with the [facility] staff and or embedded Pharm D to have home  BP cuff checked for accuracy  CCM RN CM Interventions:  01/14/19 call completed with patient  . Evaluation of current treatment plan related to Hypertension and patient's adherence to plan as established by provider. Marland Kitchen Collaborated with embedded Pharm D Lottie Dawson  regarding patient's need to bring her wrist BP cuff into the office to be checked for accuracy-discussed [name] will call Donna [name] to schedule a face to face visit   . Discussed plans with patient for ongoing care management follow up and provided patient with direct contact information for care management team . Advised patient, providing education and rationale, to monitor blood pressure daily and record, calling the CCM team and or PCP for findings outside established parameters.  . Discussed patient's son is currently not checking her BP; discussed patient is unable to use her arm cuff due to being unable to use her left arm following the stroke; discussed she does have  a wrist cuff but is unsure if it is accurate . Instructed patient to take her wrist cuff into the office at next visit for the [facility] staff and or embedded Pharm D to check the cuff for accuracy . Discussed patient is also receptive to having her BP checked in the office periodically  Patient Self Care Activities with son's assistance . Self administers medications as prescribed . Attends all scheduled provider appointments . Calls pharmacy for medication refills . Performs ADL's independently . Performs IADL's independently . Calls provider office for new concerns or questions  Please see past updates related to this goal by clicking on the "Past Updates" button in the selected goal        Other   . COMPLETED: Patient unsure how to take BP med       Current Barriers:  Marland Kitchen Knowledge Deficits related to understanding of how to take BP med   Nurse Case Manager Clinical Goal(s):  Marland Kitchen Over the next 30 days, patient will demonstrate improved adherence to prescribed treatment plan for Hypertension as evidenced by patient will verbalize taking her BP medication as prescribed; BP will be WNL Goal Not Met - See newly established goal    Interventions:  . Collaborated with Minette Brine, FNP  regarding Benicar/dose frequency. . Medication reconciliation completed, patient verbalizes understanding about how to take BP med . Scheduled CCM follow-up call with patient    Patient Self Care Activities:   Verbalizes understanding of instructions/information provided by [facility] . Self administers medications as prescribed . Calls pharmacy for medication refills . Calls provider office for new concerns or questions  Plan:  . RNCM will Telephone CCM follow-up call scheduled . RNCM will assess for adherence with taking BP medication exactly as prescribed . Patient will RNCM will encouraged patient to come into PCP office for BP check in 1-2 weeks  *initial goal documentation        Telephone  follow up appointment with care management team member scheduled for: 02/20/19  Barb Merino, RN, BSN, CCM Care Management Coordinator [place] [facility]  Direct Phone: [phone]

## 2019-01-15 NOTE — Patient Instructions (Signed)
Visit Information  Goals Addressed      Patient Stated   . "I have arthritis pain in my hands, knees and legs" (pt-stated)       Current Barriers:  Marland Kitchen Knowledge Deficits related to diagnosis and treatment management for arthritis . Impaired Physical Mobility  . Impaired Dexterity   Nurse Case Manager Clinical Goal(s):  Marland Kitchen Over the next 60 days, patient will work with CCM RN CM and PCP to address needs related to arthritis pain management  . Over the next 60 days, patient will work with in home PT to establish a safe and effective HEP to help improve physical mobility, ROM, balance, strengthening and overall stamina as evidence by patient will have increased ability to perform Self Care.   CCM RN CM Interventions:  01/14/19 spoke with patient  . Evaluation of current treatment plan related to arthritis pain and decreased dexterity and patient's adherence to plan as established by provider; discussed patient is unsure of what type of arthritis she has, reports her sister has RA, she has not been tested; reports having pain in her hands, knees and legs with decreased ROM and poor dexterity . Assessed for start of in home PT; patient states she declined the service at this time but will consider in the future - she did not provide a reason as to why she wishes to decline services at this time . Discussed plans with patient for ongoing care management follow up and provided patient with direct contact information for care management team  Patient Self Care Activities with son's assistance  . Self administers medications as prescribed . Attends all scheduled provider appointments . Calls pharmacy for medication refills . Performs ADL's independently . Performs IADL's independently . Calls provider office for new concerns or questions  Please see past updates related to this goal by clicking on the "Past Updates" button in the selected goal      . "I know I need some help with my sugar"  (pt-stated)       Current Barriers:  Marland Kitchen Knowledge Deficits related to long term plan for management of DM . Knowledge Deficits related to recommended diet/exercise plan . Non-adherence to Self monitoring CBG's   Nurse Case Manager Clinical Goal(s): Over the next 90 days, patient will work with [facility] to establish long term plan of care and self health management strategies for DM disease management. Goal Not Met . 12/18/18: Over the next 90 days, patient will work with the CCM team to develop a Diabetes disease management plan to improve her knowledge and understanding of how to improve Self Health management of DM.   CCM RN CM Interventions:  01/14/19 call completed with patient   . Evaluation of current treatment plan related to Diabetes Mellitus and patient's adherence to plan as established by provider. . Discussed plans with patient for ongoing care management follow up and provided patient with direct contact information for care management team . Ms. Gulbranson reports she has received her [employer] and needs instruction on how to use this meter . Collaborated with Lottie Dawson Pharm D regarding the need for a face to face visit with patient to assist with use of her Freestyle Libre Meter-Discussed [name] will contact Ms. Josten to schedule a face to face visit . Discussed Ms. Hyams will continue to self monitor her FBS manually until she is able to transition to the [employer] meter   Patient Self Care Activities with son's assistance . Self administers medications  as prescribed . Attends all scheduled provider appointments . Calls pharmacy for medication refills . Attends church or other social activities . Performs ADL's independently . Performs IADL's independently . Calls provider office for new concerns or questions   Please see past updates related to this goal by clicking on the "Past Updates" button in the selected goal      . "I'll have my son check my BP"  (pt-stated)       Current Barriers:  Marland Kitchen Knowledge Deficits related to Hypertension disease process, potential complications related to uncontrolled BP and importance of Self monitoring BP   Nurse Case Manager Clinical Goal(s):  Marland Kitchen Over the next 90 days, patient will verbalize understanding of plan for having son assist with Self monitoring her BP at home and logging her readings . New - 01/14/19 Over the next 14 days, patient will meet with the [facility] staff and or embedded Pharm D to have home BP cuff checked for accuracy  CCM RN CM Interventions:  01/14/19 call completed with patient  . Evaluation of current treatment plan related to Hypertension and patient's adherence to plan as established by provider. Marland Kitchen Collaborated with embedded Pharm D Lottie Dawson  regarding patient's need to bring her wrist BP cuff into the office to be checked for accuracy-discussed [name] will call Ms. Pogorzelski to schedule a face to face visit   . Discussed plans with patient for ongoing care management follow up and provided patient with direct contact information for care management team . Advised patient, providing education and rationale, to monitor blood pressure daily and record, calling the CCM team and or PCP for findings outside established parameters.  . 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 Instructed patient to take her wrist cuff into the office at next visit for the [facility] staff and or embedded Pharm D to check the cuff for accuracy . Discussed patient is also receptive to having her BP checked in the office periodically  Patient Self Care Activities with son's assistance . Self administers medications as prescribed . Attends all scheduled provider appointments . Calls pharmacy for medication refills . Performs  ADL's independently . Performs IADL's independently . Calls provider office for new concerns or questions  Please see past updates related to this goal by clicking on the "Past Updates" button in the selected goal        Other   . COMPLETED: Patient unsure how to take BP med       Current Barriers:  Marland Kitchen Knowledge Deficits related to understanding of how to take BP med   Nurse Case Manager Clinical Goal(s):  Marland Kitchen Over the next 30 days, patient will demonstrate improved adherence to prescribed treatment plan for Hypertension as evidenced by patient will verbalize taking her BP medication as prescribed; BP will be WNL Goal Not Met - See newly established goal    Interventions:  . Collaborated with Minette Brine, FNP  regarding Benicar/dose frequency. . Medication reconciliation completed, patient verbalizes understanding about how to take BP med . Scheduled CCM follow-up call with patient    Patient Self Care Activities:   Verbalizes understanding of instructions/information provided by [facility] . Self administers medications as prescribed . Calls pharmacy for medication refills . Calls provider office for new concerns or questions  Plan:  . RNCM will Telephone CCM follow-up call scheduled . RNCM will assess for adherence with taking BP medication exactly as prescribed . Patient will RNCM will encouraged patient to come into PCP office for BP check in 1-2 weeks  *initial goal documentation         The patient verbalized understanding of instructions provided today and declined a print copy of patient instruction materials.   Telephone follow up appointment with care management team member scheduled for: 02/20/19  Barb Merino, RN, BSN, CCM Care Management Coordinator [place] [facility]  Direct Phone: [phone]

## 2019-01-17 ENCOUNTER — Other Ambulatory Visit: Payer: Self-pay | Admitting: Nurse Practitioner

## 2019-01-20 ENCOUNTER — Other Ambulatory Visit: Payer: Self-pay | Admitting: Nurse Practitioner

## 2019-01-23 ENCOUNTER — Encounter: Payer: Self-pay | Admitting: Nurse Practitioner

## 2019-01-26 ENCOUNTER — Other Ambulatory Visit: Payer: Self-pay | Admitting: Nurse Practitioner

## 2019-01-28 ENCOUNTER — Telehealth: Payer: Self-pay

## 2019-01-28 NOTE — Telephone Encounter (Signed)
I have submitted a PA for Telmisartan hctz through covermymeds and we are just waiting on the determination of her insurance. YRL,RMA

## 2019-01-28 NOTE — Telephone Encounter (Signed)
Patient's PA for telmisartan hctz has been approved. Pharmacy has been noticed they stated the pt picked up her medicine back on the 10th and paid $108. YRL,RMA

## 2019-02-17 ENCOUNTER — Telehealth: Payer: Self-pay | Admitting: *Deleted

## 2019-02-17 NOTE — Telephone Encounter (Signed)
Left message-  Informing patient has switch her appointment to  Virtual  For 02/21/19.  Any question may call back to reschedule.

## 2019-02-20 ENCOUNTER — Telehealth: Payer: Medicare HMO

## 2019-02-21 ENCOUNTER — Telehealth: Payer: Self-pay | Admitting: Internal Medicine

## 2019-02-21 ENCOUNTER — Telehealth: Payer: Self-pay | Admitting: *Deleted

## 2019-02-21 ENCOUNTER — Ambulatory Visit: Payer: Self-pay

## 2019-02-21 ENCOUNTER — Telehealth: Payer: Medicare HMO | Admitting: Internal Medicine

## 2019-02-21 DIAGNOSIS — I10 Essential (primary) hypertension: Secondary | ICD-10-CM

## 2019-02-21 DIAGNOSIS — E119 Type 2 diabetes mellitus without complications: Secondary | ICD-10-CM

## 2019-02-21 NOTE — Telephone Encounter (Signed)
Late entry  Called patient x 3 to start virtual appointment. Left message will cancel  Appointment for today will have scheduler to contact patient to reschedule  A in office visit. Or patient call back to schedule

## 2019-02-21 NOTE — Telephone Encounter (Signed)
Open error 

## 2019-02-21 NOTE — Telephone Encounter (Signed)
Called no answer  Only voicemail . Left message calling to start virtual visit will call back in @ 5 mins

## 2019-02-21 NOTE — Chronic Care Management (AMB) (Signed)
  Chronic Care Management   Outreach Note  02/21/2019 Name: Donna [name] MRN: [id number] DOB: 11-25-1950  Referred by: Minette Brine, FNP Reason for referral : Care Coordination   Unsuccessful outbound call to the patient placed to the patient to assist with ongoing case management and care coordination needs. SW left a HIPAA compliant voice message requesting a return call.  Follow Up Plan: The care management team will reach out to the patient again over the next 14 days.   Daneen Schick, BSW, CDP Social Worker, Certified Dementia Practitioner [place] [facility] [place] [facility] [phone]

## 2019-02-21 NOTE — Telephone Encounter (Signed)
Called to reschedule 02/21/19 appointment with Dr. Lunette Stands mail full and could not leave message

## 2019-02-24 ENCOUNTER — Ambulatory Visit: Payer: Self-pay | Admitting: Pharmacist

## 2019-02-24 DIAGNOSIS — E119 Type 2 diabetes mellitus without complications: Secondary | ICD-10-CM

## 2019-02-24 DIAGNOSIS — Z794 Long term (current) use of insulin: Secondary | ICD-10-CM

## 2019-02-24 NOTE — Progress Notes (Signed)
  Chronic Care Management   Outreach Note  02/24/2019 Name: Donna [name] MRN: [id number] DOB: Apr 12, 1950  Referred by: Minette Brine, FNP Reason for referral : Chronic Care Management   An unsuccessful telephone outreach was attempted today. The patient was referred to the case management team by for assistance with care management and care coordination. Application for Xutol[employer] prepared and mailed to patient on 02/24/19.  Follow Up Plan: The care management team will reach out to the patient again over the next 7 days. Unable to leave VM due to full inbox.  SIGNATURE Regina Eck, PharmD, BCPS Clinical Pharmacist, [facility] [place]: [phone]

## 2019-02-26 ENCOUNTER — Ambulatory Visit: Payer: Self-pay | Admitting: Pharmacist

## 2019-02-26 DIAGNOSIS — E119 Type 2 diabetes mellitus without complications: Secondary | ICD-10-CM

## 2019-02-26 DIAGNOSIS — I10 Essential (primary) hypertension: Secondary | ICD-10-CM

## 2019-02-27 ENCOUNTER — Ambulatory Visit: Payer: Medicare HMO

## 2019-02-27 DIAGNOSIS — E119 Type 2 diabetes mellitus without complications: Secondary | ICD-10-CM

## 2019-02-27 DIAGNOSIS — Z794 Long term (current) use of insulin: Secondary | ICD-10-CM

## 2019-02-27 DIAGNOSIS — I10 Essential (primary) hypertension: Secondary | ICD-10-CM

## 2019-02-27 NOTE — Chronic Care Management (AMB) (Signed)
  Chronic Care Management    Social Work General Note  02/27/2019 Name: Donna [name] MRN: [id number] DOB: [birth year]  Donna [name] is a 68 y.o. year old female who is a primary care patient of Minette Brine, [place]. The CCM was consulted to assist the patient with care coordination.   Review of patient status, including review of consultants reports, relevant laboratory and other test results, and collaboration with appropriate care team members and the patient's provider was performed as part of comprehensive patient evaluation and provision of chronic care management services.    SW placed a successful outbound call to the patient to conduct an SDOH (Social Determinants of Health) screening  No challenges identified.  Outpatient Encounter Medications as of 02/27/2019  Medication Sig Note  . amLODipine (NORVASC) 10 MG tablet Take 1 tablet by mouth once daily   . aspirin EC 81 MG tablet Take 81 mg by mouth every evening.   . blood glucose meter kit and supplies KIT Dispense based on patient and insurance preference. Use up to four times daily as directed. (FOR ICD-9 250.00, 250.01). 06/13/2018: Bought ReliON meter from [place]    . Calcium Carbonate-Vitamin D (CALCIUM-D PO) Take 2 tablets by mouth daily at 12 noon.    . carvedilol (COREG) 6.25 MG tablet Take 1 tablet (6.25 mg total) by mouth 2 (two) times daily.   . Multiple Vitamin (MULTIVITAMIN PO) Take 1 tablet by mouth daily at 12 noon.    . Omega-3 Fatty Acids (OMEGA-3 PLUS PO) Take 2 capsules by mouth daily at 12 noon. 12/17/2018: Patient will refill   . SOLIQUA 100-33 UNT-MCG/ML SOPN INJECT 20  SUBCUTANEOUSLY INTO THE SKIN  AT BEDTIME   . telmisartan-hydrochlorothiazide (MICARDIS HCT) 40-12.5 MG tablet Take 1 tablet by mouth once daily   . ticagrelor (BRILINTA) 90 MG TABS tablet Take 90 mg by mouth 2 (two) times daily. Patient has not picked it up yet   . vitamin C (ASCORBIC ACID) 250 MG tablet Take 500 mg by mouth daily at 12 noon.     No facility-administered encounter medications on file as of 02/27/2019.    Follow Up Plan: No SW follow up planned at this time. The patient is encouraged to contact SW with future resource needs. The patient will remain active with CM team.       Daneen Schick, BSW, CDP Social Worker, Certified Dementia Practitioner [place] [facility] [place] [facility] [phone]

## 2019-02-27 NOTE — Progress Notes (Signed)
Chronic Care Management   Visit Note  02/26/2019 Name: Donna [name] MRN: [id number] DOB: May 05, 1950  Referred by: Minette Brine, FNP Reason for referral : Chronic Care Management   Donna [name] is a 68 y.o. year old female who is a primary care patient of Minette Brine, [place]. The CCM team was consulted for assistance with chronic disease management and care coordination needs related to HTN and DMII  Review of patient status, including review of consultants reports, relevant laboratory and other test results, and collaboration with appropriate care team members and the patient's provider was performed as part of comprehensive patient evaluation and provision of chronic care management services.    I spoke with Donna [name] by telephone today.  Medications: Outpatient Encounter Medications as of 02/26/2019  Medication Sig Note  . amLODipine (NORVASC) 10 MG tablet Take 1 tablet by mouth once daily   . aspirin EC 81 MG tablet Take 81 mg by mouth every evening.   . blood glucose meter kit and supplies KIT Dispense based on patient and insurance preference. Use up to four times daily as directed. (FOR ICD-9 250.00, 250.01). 06/13/2018: Bought ReliON meter from [place]    . Calcium Carbonate-Vitamin D (CALCIUM-D PO) Take 2 tablets by mouth daily at 12 noon.    . carvedilol (COREG) 6.25 MG tablet Take 1 tablet (6.25 mg total) by mouth 2 (two) times daily.   . Multiple Vitamin (MULTIVITAMIN PO) Take 1 tablet by mouth daily at 12 noon.    . Omega-3 Fatty Acids (OMEGA-3 PLUS PO) Take 2 capsules by mouth daily at 12 noon. 12/17/2018: Patient will refill   . SOLIQUA 100-33 UNT-MCG/ML SOPN INJECT 20  SUBCUTANEOUSLY INTO THE SKIN  AT BEDTIME   . telmisartan-hydrochlorothiazide (MICARDIS HCT) 40-12.5 MG tablet Take 1 tablet by mouth once daily   . ticagrelor (BRILINTA) 90 MG TABS tablet Take 90 mg by mouth 2 (two) times daily. Patient has not picked it up yet   . vitamin C (ASCORBIC ACID) 250 MG tablet  Take 500 mg by mouth daily at 12 noon.    No facility-administered encounter medications on file as of 02/26/2019.     Objective:   Goals Addressed            This Visit's Progress     Patient Stated   . I need help paying for my medications (pt-stated)       Current Barriers:  . Financial Barriers: patient has [employer] and reports copay for [employer] ($134/month) is cost prohibitive at this time  Pharmacist Clinical Goal(s):  Marland Kitchen Over the next 30 days, patient will work with PharmD and providers to relieve medication access concerns  Interventions: . Comprehensive medication review completed; medication list updated in electronic medical record.  Laurel Dimmer by [employer]: Patient meets income/No out of pocket spend criteria for this medication's patient assistance program. Reviewed application process. Patient will provide proof of income, out of pocket spend report, and will sign application. Will collaborate with primary care provider, Minette Brine, DNP, FNP for their portion of application. Once completed, will submit to [facility] patient assistance program. . Patient states she is awaiting letter from [facility] in order to submit form.  Encouraged pt to call back when ready.  Patient Self Care Activities:  . Patient will provide necessary portions of application   Please see past updates related to this goal by clicking on the "Past Updates" button in the selected goal       . I  need to control & monitor my blood sugar (pt-stated)       Current Barriers:  . Diabetes: T2DM; most recent A1c 11.3% on 11/27/18  (A1c has decreased from 12.3% on 10/07/18--> patient motivated to decrease A1c towards goal  Patient states her diet has not been ideal,but has improved since we last spoke as her son si continuing to prepare appropriate meals.   . Current antihyperglycemic regimen: Soliqua 25 units daily (reports compliance) o Will have to apply for [facility] given ease of patient assistance program (  no out of pocket requirements) . Denies hypoglycemic symptoms . Reports hyperglycemic symptoms: headaches, polydipsia . Current meal patterns: son is cooking lower carbohydrate meals o Drinks: sugary drinks, trying to drink more water . Current exercise: trying to walk . Current blood glucose readings: FBG 170-190s (coming down) . Cardiovascular risk reduction: o Current hypertensive regimen: amlodipine (now taking), carvedilol, telmisartan/hctz o Current hyperlipidemia regimen: n/a-->statin intolerance (appt with lipid clinic to arrange Repatha) o Reinforce importance of daily aspirin.  Patient not taking as presribed.  She is now taking Brilinta.  She has "failed Plavix" per patient and clinical documentation  Pharmacist Clinical Goal(s):  Marland Kitchen Over the next 90 days, patient with work with PharmD and primary care provider to address optimized medication & disease state management (diabetes).  Interventions: . Comprehensive medication review performed, medication list updated in electronic medical record . Reviewed medication fill history via insurance claims data.  . Reviewed & discussed the following diabetes-related information with patient: o Continue checking blood sugars as directed o Follow ADA recommended "diabetes-friendly" diet  (reviewed healthy snack/food options).  Mailed diabetes materials and snack food options.  Suggested low/zero sugar drinks for patient to try.  She states she is having trouble just drinking water. o Discussed insulin/GLP-1 injection technique/instructions. o Reviewed medication purpose/side effects-->patient denies adverse events, denies hypoglycemia o Continue taking all medications as prescribed by provider  Patient Self Care Activities:  . Patient will check blood glucose daily , document, and provide at future appointments . Patient will focus on medication adherence by taking all medications as prescribed. . Patient will take medications as  prescribed . Patient will contact provider with any episodes of hypoglycemia . Patient will report any questions or concerns to provider    Please see past updates related to this goal by clicking on the "Past Updates" button in the selected goal          Plan:   The care management team will reach out to the patient again over the next 30 days.   Provider Signature Regina Eck, PharmD, BCPS Clinical Pharmacist, [facility] [place]: [phone]

## 2019-02-27 NOTE — Patient Instructions (Signed)
Visit Information  Goals Addressed            This Visit's Progress     Patient Stated   . I need help paying for my medications (pt-stated)       Current Barriers:  . Financial Barriers: patient has [employer] and reports copay for [employer] ($134/month) is cost prohibitive at this time  Pharmacist Clinical Goal(s):  Marland Kitchen Over the next 30 days, patient will work with PharmD and providers to relieve medication access concerns  Interventions: . Comprehensive medication review completed; medication list updated in electronic medical record.  Laurel Dimmer [employer]: Patient meets income/No out of pocket spend criteria for this medication's patient assistance program. Reviewed application process. Patient will provide proof of income, out of pocket spend report, and will sign application. Will collaborate with primary care provider, Minette Brine, DNP, FNP for their portion of application. Once completed, will submit to [facility] patient assistance program. . Patient states she is awaiting letter from [facility] in order to submit form.  Encouraged pt to call back when ready.  Patient Self Care Activities:  . Patient will provide necessary portions of application   Please see past updates related to this goal by clicking on the "Past Updates" button in the selected goal       . I need to control & monitor my blood sugar (pt-stated)       Current Barriers:  . Diabetes: T2DM; most recent A1c 11.3% on 11/27/18  (A1c has decreased from 12.3% on 10/07/18--> patient motivated to decrease A1c towards goal  Patient states her diet has not been ideal,but has improved since we last spoke as her son si continuing to prepare appropriate meals.   . Current antihyperglycemic regimen: Soliqua 25 units daily (reports compliance) o Will have to apply for [facility] given ease of patient assistance program ( no out of pocket requirements) . Denies hypoglycemic symptoms . Reports hyperglycemic symptoms: headaches,  polydipsia . Current meal patterns: son is cooking lower carbohydrate meals o Drinks: sugary drinks, trying to drink more water . Current exercise: trying to walk . Current blood glucose readings: FBG 170-190s (coming down) . Cardiovascular risk reduction: o Current hypertensive regimen: amlodipine (now taking), carvedilol, telmisartan/hctz o Current hyperlipidemia regimen: n/a-->statin intolerance (appt with lipid clinic to arrange Repatha) o Reinforce importance of daily aspirin.  Patient not taking as presribed.  She is now taking Brilinta.  She has "failed Plavix" per patient and clinical documentation  Pharmacist Clinical Goal(s):  Marland Kitchen Over the next 90 days, patient with work with PharmD and primary care provider to address optimized medication & disease state management (diabetes).  Interventions: . Comprehensive medication review performed, medication list updated in electronic medical record . Reviewed medication fill history via insurance claims data.  . Reviewed & discussed the following diabetes-related information with patient: o Continue checking blood sugars as directed o Follow ADA recommended "diabetes-friendly" diet  (reviewed healthy snack/food options).  Mailed diabetes materials and snack food options.  Suggested low/zero sugar drinks for patient to try.  She states she is having trouble just drinking water. o Discussed insulin/GLP-1 injection technique/instructions. o Reviewed medication purpose/side effects-->patient denies adverse events, denies hypoglycemia o Continue taking all medications as prescribed by provider  Patient Self Care Activities:  . Patient will check blood glucose daily , document, and provide at future appointments . Patient will focus on medication adherence by taking all medications as prescribed. . Patient will take medications as prescribed . Patient will contact provider  with any episodes of hypoglycemia . Patient will report any questions or  concerns to provider    Please see past updates related to this goal by clicking on the "Past Updates" button in the selected goal         The patient verbalized understanding of instructions provided today and declined a print copy of patient instruction materials.   The care management team will reach out to the patient again over the next 30 days.   SIGNATURE Kieth Brightly, PharmD, BCPS Clinical Pharmacist, [facility]  [facility]  Direct Dial: [phone]

## 2019-03-04 ENCOUNTER — Other Ambulatory Visit: Payer: Self-pay

## 2019-03-04 ENCOUNTER — Ambulatory Visit (INDEPENDENT_AMBULATORY_CARE_PROVIDER_SITE_OTHER): Payer: Medicare HMO | Admitting: Cardiology

## 2019-03-04 ENCOUNTER — Encounter: Payer: Self-pay | Admitting: Cardiology

## 2019-03-04 ENCOUNTER — Other Ambulatory Visit: Payer: Self-pay | Admitting: Nurse Practitioner

## 2019-03-04 VITALS — BP 130/62 | HR 63 | Temp 97.3°F | Ht 60.0 in | Wt 165.0 lb

## 2019-03-04 DIAGNOSIS — R079 Chest pain, unspecified: Secondary | ICD-10-CM

## 2019-03-04 DIAGNOSIS — Z8673 Personal history of transient ischemic attack (TIA), and cerebral infarction without residual deficits: Secondary | ICD-10-CM

## 2019-03-04 DIAGNOSIS — Z794 Long term (current) use of insulin: Secondary | ICD-10-CM | POA: Diagnosis not present

## 2019-03-04 DIAGNOSIS — E119 Type 2 diabetes mellitus without complications: Secondary | ICD-10-CM | POA: Diagnosis not present

## 2019-03-04 DIAGNOSIS — R0789 Other chest pain: Secondary | ICD-10-CM | POA: Diagnosis not present

## 2019-03-04 DIAGNOSIS — I10 Essential (primary) hypertension: Secondary | ICD-10-CM | POA: Diagnosis not present

## 2019-03-04 DIAGNOSIS — I739 Peripheral vascular disease, unspecified: Secondary | ICD-10-CM | POA: Diagnosis not present

## 2019-03-04 DIAGNOSIS — Z1231 Encounter for screening mammogram for malignant neoplasm of breast: Secondary | ICD-10-CM

## 2019-03-04 DIAGNOSIS — Z01810 Encounter for preprocedural cardiovascular examination: Secondary | ICD-10-CM | POA: Diagnosis not present

## 2019-03-04 MED ORDER — HYDRALAZINE HCL 25 MG PO TABS: 25.0000 mg | ORAL_TABLET | Freq: Three times a day (TID) | ORAL | 1 refills | Status: DC

## 2019-03-04 MED ORDER — METOPROLOL TARTRATE 50 MG PO TABS: 50.0000 mg | ORAL_TABLET | Freq: Once | ORAL | 0 refills | Status: DC

## 2019-03-04 NOTE — Patient Instructions (Addendum)
Medication Instructions:  START Hydralazine 25mg  Take 1 tablet twice a day  *If you need a refill on your cardiac medications before your next appointment, please call your pharmacy*  Lab Work: Your physician recommends that you return for lab work in: [place] TEST If you have labs (blood work) drawn today and your tests are completely normal, you will receive your results only by: Marland Kitchen MyChart Message (if you have MyChart) OR . A paper copy in the mail If you have any lab test that is abnormal or we need to change your treatment, we will call you to review the results.  Testing/Procedures: CORONARY CT INSTRUCTIONS BELOW  Follow-Up: At [facility], you and your health needs are our priority.  As part of our continuing mission to provide you with exceptional heart care, we have created designated Provider Care Teams.  These Care Teams include your primary Cardiologist (physician) and Advanced Practice Providers (APPs -  Physician Assistants and Nurse Practitioners) who all work together to provide you with the care you need, when you need it.  Your next appointment:   2 week(s)  The format for your next appointment:   In Person  Provider:   Kerin Ransom, PA-C  Other Instructions [place]; MONDAYS 1 HOUR AFTER BREAK, WEDNESDAYS AFTER LUNCH AND FRIDAYS IN THE EVENING  RECORD READINGS AND BRING TO YOUR NEXT APPT       INSTRUCTIONS FOR CORONARY CTA  Please arrive at the [facility] main entrance of [facility] at (30-45 minutes prior to test start time)  [facility] [place] [employer], [place] [address] [phone]  Proceed to the [facility] Radiology Department (First Floor).  Please follow these instructions carefully (unless otherwise directed): PLEASE HAVE LABS - BMP  AT LEAST ONE WEEK PRIOR TO TEST  On the Night Before the Test: Drink plenty of water. Do not consume any  caffeinated/decaffeinated beverages or chocolate 12 hours prior to your test. Do not take any antihistamines 12 hours prior to your test.  On the Day of the Test: Drink plenty of water. Do not drink any water within one hour of the test. Do not eat any food 4 hours prior to the test. You may take your regular medications prior to the test. Take 50 mg of Lopressor (Metoprolol) one hour before the test.  After the Test: Drink plenty of water. After receiving IV contrast, you may experience a mild flushed feeling. This is normal. On occasion, you may experience a mild rash up to 24 hours after the test. This is not dangerous. If this occurs, you can take Benadryl 25 mg and increase your fluid intake. If you experience trouble breathing, this can be serious. If it is severe call 911 IMMEDIATELY. If it is mild, please call our office.

## 2019-03-04 NOTE — Assessment & Plan Note (Signed)
IDDM 

## 2019-03-04 NOTE — Assessment & Plan Note (Signed)
Pt has some chest and multiple risk factors for CAD.  Reviewed with Dr Harrell Gave in the office.  PLan on coronary CTA with FFR to r/o high risk CAD prior to clearance.

## 2019-03-04 NOTE — Assessment & Plan Note (Signed)
Carotid and RMCA disease 
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
## 2019-03-04 NOTE — Assessment & Plan Note (Signed)
Uncontrolled- B/P by me 170/70- add Hydralazine 25 mg BID- f/u two weeks

## 2019-03-04 NOTE — Assessment & Plan Note (Signed)
April 2020- Rt brain CVA with Lt sided weakness 

## 2019-03-10 ENCOUNTER — Ambulatory Visit: Payer: Self-pay | Admitting: Pharmacist

## 2019-03-10 DIAGNOSIS — Z20828 Contact with and (suspected) exposure to other viral communicable diseases: Secondary | ICD-10-CM | POA: Diagnosis not present

## 2019-03-10 DIAGNOSIS — E119 Type 2 diabetes mellitus without complications: Secondary | ICD-10-CM

## 2019-03-10 DIAGNOSIS — R062 Wheezing: Secondary | ICD-10-CM | POA: Diagnosis not present

## 2019-03-10 DIAGNOSIS — I10 Essential (primary) hypertension: Secondary | ICD-10-CM

## 2019-03-10 DIAGNOSIS — R05 Cough: Secondary | ICD-10-CM | POA: Diagnosis not present

## 2019-03-10 NOTE — Progress Notes (Signed)
  Chronic Care Management   Outreach Note  03/10/2019 Name: Donna [name] MRN: [id number] DOB: 1951/01/03  Referred by: Minette Brine, FNP Reason for referral : Chronic Care Management   A second unsuccessful telephone outreach was attempted today. The patient was referred to the case management team for assistance with care management and care coordination.   Follow Up Plan: A HIPPA compliant phone message was left for the patient providing contact information and requesting a return call.  The care management team will reach out to the patient again over the next 7-10 days.   SIGNATURE Regina Eck, PharmD, BCPS Clinical Pharmacist, [facility] [place]: [phone]

## 2019-03-11 ENCOUNTER — Telehealth: Payer: Self-pay | Admitting: Internal Medicine

## 2019-03-11 ENCOUNTER — Other Ambulatory Visit: Payer: Self-pay | Admitting: Nurse Practitioner

## 2019-03-11 NOTE — Telephone Encounter (Signed)
Patient states she received a call from our office. I dont see a note in regards to who called. She is not sure if someone left a name on the VM.

## 2019-03-11 NOTE — Telephone Encounter (Signed)
Unable to locate note in chart regarding reason for pt receiving phone call from [place]. Attempted call to pt to discuss. No answer. Unable to LVM d/t mailbox full.

## 2019-03-12 ENCOUNTER — Ambulatory Visit (INDEPENDENT_AMBULATORY_CARE_PROVIDER_SITE_OTHER): Payer: Medicare HMO | Admitting: Pharmacist

## 2019-03-12 DIAGNOSIS — E119 Type 2 diabetes mellitus without complications: Secondary | ICD-10-CM

## 2019-03-12 DIAGNOSIS — I10 Essential (primary) hypertension: Secondary | ICD-10-CM | POA: Diagnosis not present

## 2019-03-12 DIAGNOSIS — Z794 Long term (current) use of insulin: Secondary | ICD-10-CM

## 2019-03-12 NOTE — Progress Notes (Signed)
Chronic Care Management   Visit Note  03/12/2019 Name: Donna [name] MRN: [id number] DOB: 26-Oct-1950  Referred by: Minette Brine, FNP Reason for referral : Chronic Care Management   Donna [name] is a 68 y.o. year old female who is a primary care patient of Minette Brine, [place]. The CCM team was consulted for assistance with chronic disease management and care coordination needs related to DMII  Review of patient status, including review of consultants reports, relevant laboratory and other test results, and collaboration with appropriate care team members and the patient's provider was performed as part of comprehensive patient evaluation and provision of chronic care management services.    I spoke with Donna [name] by telephone today.  Medications: Outpatient Encounter Medications as of 03/12/2019  Medication Sig Note  . amLODipine (NORVASC) 10 MG tablet Take 1 tablet by mouth once daily   . aspirin EC 81 MG tablet Take 81 mg by mouth every evening.   . blood glucose meter kit and supplies KIT Dispense based on patient and insurance preference. Use up to four times daily as directed. (FOR ICD-9 250.00, 250.01). 06/13/2018: Bought ReliON meter from [place]    . Calcium Carbonate-Vitamin D (CALCIUM-D PO) Take 2 tablets by mouth daily at 12 noon.    . carvedilol (COREG) 6.25 MG tablet Take 1 tablet (6.25 mg total) by mouth 2 (two) times daily.   . hydrALAZINE (APRESOLINE) 25 MG tablet Take 1 tablet (25 mg total) by mouth 3 (three) times daily.   . metoprolol tartrate (LOPRESSOR) 50 MG tablet Take 1 tablet (50 mg total) by mouth once for 1 dose. TAKE 1 TABLET 1 HOUR PRIOR TO PROCEDURE   . Multiple Vitamin (MULTIVITAMIN PO) Take 1 tablet by mouth daily at 12 noon.    . Omega-3 Fatty Acids (OMEGA-3 PLUS PO) Take 2 capsules by mouth daily at 12 noon. 12/17/2018: Patient will refill   . SOLIQUA 100-33 UNT-MCG/ML SOPN INJECT 20  SUBCUTANEOUSLY INTO THE SKIN  AT BEDTIME (Patient taking differently:  Pt inject 25 Subcutaneously into skin)   . telmisartan-hydrochlorothiazide (MICARDIS HCT) 40-12.5 MG tablet Take 1 tablet by mouth once daily   . ticagrelor (BRILINTA) 90 MG TABS tablet Take 90 mg by mouth 2 (two) times daily. Patient has not picked it up yet   . vitamin C (ASCORBIC ACID) 250 MG tablet Take 500 mg by mouth daily at 12 noon.    No facility-administered encounter medications on file as of 03/12/2019.     Objective:   Goals Addressed            This Visit's Progress     Patient Stated   . I need help paying for my medications (pt-stated)       Current Barriers:  . Financial Barriers: patient has [employer] and reports copay for [employer] ($134/month) is cost prohibitive at this time  Pharmacist Clinical Goal(s):  Marland Kitchen Over the next 30 days, patient will work with PharmD and providers to relieve medication access concerns  Interventions: . Comprehensive medication review completed; medication list updated in electronic medical record.  Laurel Dimmer by [employer]: Patient meets income/No out of pocket spend criteria for this medication's patient assistance program. Reviewed application process. Patient will provide proof of income, out of pocket spend report, and will sign application. Will collaborate with primary care provider, Minette Brine, DNP, FNP for their portion of application. Once completed, will submit to [employer] patient assistance program. . 03/12/19: Patient states she is awaiting letter from  SS in order to submit form.  Encouraged pt to bring in next week.  Patient Self Care Activities:  . Patient will provide necessary portions of application   Please see past updates related to this goal by clicking on the "Past Updates" button in the selected goal       . I need to control & monitor my blood sugar (pt-stated)       Current Barriers:  . Diabetes: T2DM; most recent A1c 11.3% on 11/27/18  (A1c has decreased from 12.3% on 10/07/18--> patient motivated to decrease  A1c towards goal  Patient states her diet has not been ideal,but has improved since we last spoke as her son si continuing to prepare appropriate meals.   . Current antihyperglycemic regimen: Soliqua 25 units daily (reports compliance) o Will have to apply for [facility] given ease of patient assistance program ( no out of pocket requirements) o Next PCP appt 04/08/19--will f/u . Denies hypoglycemic symptoms . Reports hyperglycemic symptoms: headaches, polydipsia . Current meal patterns: son is cooking lower carbohydrate meals o Drinks: sugary drinks, trying to drink more water . Current exercise: trying to walk . Current blood glucose readings: FBG 170-190s (coming down) . Cardiovascular risk reduction: o Current hypertensive regimen: amlodipine (now taking), carvedilol, telmisartan/hctz o Current hyperlipidemia regimen: n/a-->statin intolerance (appt with lipid clinic to arrange Repatha) o Reinforce importance of daily aspirin.  Patient not taking as presribed.  She is now taking Brilinta.  She has "failed Plavix" per patient and clinical documentation  Pharmacist Clinical Goal(s):  Marland Kitchen Over the next 90 days, patient with work with PharmD and primary care provider to address optimized medication & disease state management (diabetes).  Interventions: Call completed on 03/11/29 . Comprehensive medication review performed, medication list updated in electronic medical record . Reviewed medication fill history via insurance claims data.  . Reviewed & discussed the following diabetes-related information with patient: o Continue checking blood sugars as directed o Follow ADA recommended "diabetes-friendly" diet  (reviewed healthy snack/food options).  Mailed diabetes materials and snack food options.  Suggested low/zero sugar drinks for patient to try.  She states she is having trouble just drinking water. o Discussed insulin/GLP-1 injection technique/instructions. o Reviewed medication purpose/side  effects-->patient denies adverse events, denies hypoglycemia o Continue taking all medications as prescribed by provider  Patient Self Care Activities:  . Patient will check blood glucose daily , document, and provide at future appointments . Patient will focus on medication adherence by taking all medications as prescribed. . Patient will take medications as prescribed . Patient will contact provider with any episodes of hypoglycemia . Patient will report any questions or concerns to provider    Please see past updates related to this goal by clicking on the "Past Updates" button in the selected goal          Plan:   The care management team will reach out to the patient again over the next 30 days.   Provider Signature Regina Eck, PharmD, BCPS Clinical Pharmacist, [facility] [place]: [phone]

## 2019-03-12 NOTE — Patient Instructions (Signed)
Visit Information  Goals Addressed            This Visit's Progress     Patient Stated   . I need help paying for my medications (pt-stated)       Current Barriers:  . Financial Barriers: patient has [facility] and reports copay for [employer] ($134/month) is cost prohibitive at this time  Pharmacist Clinical Goal(s):  Marland Kitchen Over the next 30 days, patient will work with PharmD and providers to relieve medication access concerns  Interventions: . Comprehensive medication review completed; medication list updated in electronic medical record.  Tery Sanfilippo by [employer]: Patient meets income/No out of pocket spend criteria for this medication's patient assistance program. Reviewed application process. Patient will provide proof of income, out of pocket spend report, and will sign application. Will collaborate with primary care provider, Arnette Felts, DNP, FNP for their portion of application. Once completed, will submit to [employer] patient assistance program. . 03/12/19: Patient states she is awaiting letter from [facility] in order to submit form.  Encouraged pt to bring in next week.  Patient Self Care Activities:  . Patient will provide necessary portions of application   Please see past updates related to this goal by clicking on the "Past Updates" button in the selected goal       . I need to control & monitor my blood sugar (pt-stated)       Current Barriers:  . Diabetes: T2DM; most recent A1c 11.3% on 11/27/18  (A1c has decreased from 12.3% on 10/07/18--> patient motivated to decrease A1c towards goal  Patient states her diet has not been ideal,but has improved since we last spoke as her son si continuing to prepare appropriate meals.   . Current antihyperglycemic regimen: Soliqua 25 units daily (reports compliance) o Will have to apply for [facility] given ease of patient assistance program ( no out of pocket requirements) o Next PCP appt 04/08/19--will f/u . Denies hypoglycemic symptoms . Reports  hyperglycemic symptoms: headaches, polydipsia . Current meal patterns: son is cooking lower carbohydrate meals o Drinks: sugary drinks, trying to drink more water . Current exercise: trying to walk . Current blood glucose readings: FBG 170-190s (coming down) . Cardiovascular risk reduction: o Current hypertensive regimen: amlodipine (now taking), carvedilol, telmisartan/hctz o Current hyperlipidemia regimen: n/a-->statin intolerance (appt with lipid clinic to arrange Repatha) o Reinforce importance of daily aspirin.  Patient not taking as presribed.  She is now taking Brilinta.  She has "failed Plavix" per patient and clinical documentation  Pharmacist Clinical Goal(s):  Marland Kitchen Over the next 90 days, patient with work with PharmD and primary care provider to address optimized medication & disease state management (diabetes).  Interventions: Call completed on 03/11/29 . Comprehensive medication review performed, medication list updated in electronic medical record . Reviewed medication fill history via insurance claims data.  . Reviewed & discussed the following diabetes-related information with patient: o Continue checking blood sugars as directed o Follow ADA recommended "diabetes-friendly" diet  (reviewed healthy snack/food options).  Mailed diabetes materials and snack food options.  Suggested low/zero sugar drinks for patient to try.  She states she is having trouble just drinking water. o Discussed insulin/GLP-1 injection technique/instructions. o Reviewed medication purpose/side effects-->patient denies adverse events, denies hypoglycemia o Continue taking all medications as prescribed by provider  Patient Self Care Activities:  . Patient will check blood glucose daily , document, and provide at future appointments . Patient will focus on medication adherence by taking all medications as prescribed. Marland Kitchen  Patient will take medications as prescribed . Patient will contact provider with any  episodes of hypoglycemia . Patient will report any questions or concerns to provider    Please see past updates related to this goal by clicking on the "Past Updates" button in the selected goal         The patient verbalized understanding of instructions provided today and declined a print copy of patient instruction materials.   The care management team will reach out to the patient again over the next 30 days.   SIGNATURE Regina Eck, PharmD, BCPS Clinical Pharmacist, [facility] [place]: [phone]

## 2019-03-17 ENCOUNTER — Ambulatory Visit: Payer: Medicare HMO | Admitting: Cardiology

## 2019-03-18 ENCOUNTER — Telehealth: Payer: Self-pay

## 2019-03-18 ENCOUNTER — Telehealth: Payer: Self-pay | Admitting: *Deleted

## 2019-03-18 ENCOUNTER — Other Ambulatory Visit: Payer: Self-pay

## 2019-03-18 ENCOUNTER — Ambulatory Visit: Payer: Medicare HMO | Admitting: Adult Health

## 2019-03-18 NOTE — Telephone Encounter (Signed)
Patient and her sister, Buzzy Han, came in for an appointment today.  I explained that we had tried several times to reach them to convert to a virtual visit without success.   They are not sure about scheduling a virtual visit.  Wonder if she should have a face to face visit instead?  Please call to discuss.  They also have questions about some procedure that needs to be schedule?

## 2019-03-18 NOTE — Telephone Encounter (Signed)
Unable to reach pt about converting appt today to virtual visit.   Tried calling [phone] twice. Someone picks up but does not respond to me.  Tried son on DPR, VM not set up to LVM. Tried daughter, number not in service.  Tried work number [phone]. This was number to [employer], no extension. Spoke with associate who states she no longer works there.  Joellen [place], RN has also tried reaching pt yesterday but unable.

## 2019-03-18 NOTE — Telephone Encounter (Signed)
Spoke with sister to schedule virtual visit for cough

## 2019-03-19 ENCOUNTER — Encounter: Payer: Self-pay | Admitting: Nurse Practitioner

## 2019-03-19 ENCOUNTER — Other Ambulatory Visit: Payer: Self-pay

## 2019-03-19 ENCOUNTER — Telehealth: Payer: Self-pay

## 2019-03-19 ENCOUNTER — Encounter: Payer: Medicare HMO | Admitting: Nurse Practitioner

## 2019-03-19 NOTE — Telephone Encounter (Signed)
I attempted to call pt twice to complete her virtual visit and I have called her sister and she is suppose to have the pt give [place] a call. YRL,RMA

## 2019-03-19 NOTE — Telephone Encounter (Signed)
Called pt to do intake questions for virtual appt, vm full unable to leave message

## 2019-03-20 NOTE — Telephone Encounter (Signed)
Called pt to r/s her follow up, no answer unable to LVM due to mailbox being full

## 2019-03-27 ENCOUNTER — Ambulatory Visit: Payer: Medicare HMO | Admitting: Cardiology

## 2019-03-27 ENCOUNTER — Other Ambulatory Visit: Payer: Self-pay

## 2019-03-27 ENCOUNTER — Encounter: Payer: Self-pay | Admitting: Cardiology

## 2019-03-27 VITALS — BP 156/76 | HR 70 | Temp 97.3°F | Ht 60.0 in | Wt 156.0 lb

## 2019-03-27 DIAGNOSIS — Z8616 Personal history of COVID-19: Secondary | ICD-10-CM

## 2019-03-27 DIAGNOSIS — I739 Peripheral vascular disease, unspecified: Secondary | ICD-10-CM | POA: Diagnosis not present

## 2019-03-27 DIAGNOSIS — Z8673 Personal history of transient ischemic attack (TIA), and cerebral infarction without residual deficits: Secondary | ICD-10-CM

## 2019-03-27 DIAGNOSIS — I10 Essential (primary) hypertension: Secondary | ICD-10-CM

## 2019-03-27 DIAGNOSIS — U071 COVID-19: Secondary | ICD-10-CM | POA: Insufficient documentation

## 2019-03-27 NOTE — Assessment & Plan Note (Signed)
By her history she had COVID in late Dec- early Jan.  I will contact [facility] to try and get her COVID test results- it may affect her pre coronary CTA COVID test.

## 2019-03-27 NOTE — Assessment & Plan Note (Signed)
Better control, EF > 65% with ASH on echo July 2020.

## 2019-03-27 NOTE — Patient Instructions (Signed)
Medication Instructions:  Your physician recommends that you continue on your current medications as directed. Please refer to the Current Medication list given to you today. *If you need a refill on your cardiac medications before your next appointment, please call your pharmacy*  Lab Work: None  If you have labs (blood work) drawn today and your tests are completely normal, you will receive your results only by: Marland Kitchen MyChart Message (if you have MyChart) OR . A paper copy in the mail If you have any lab test that is abnormal or we need to change your treatment, we will call you to review the results.  Testing/Procedures: None   Follow-Up: At [facility], you and your health needs are our priority.  As part of our continuing mission to provide you with exceptional heart care, we have created designated Provider Care Teams.  These Care Teams include your primary Cardiologist (physician) and Advanced Practice Providers (APPs -  Physician Assistants and Nurse Practitioners) who all work together to provide you with the care you need, when you need it.  Your next appointment:   After coronary CT  The format for your next appointment:   Virtual Visit   Provider:   Weston Brass, MD  Other Instructions

## 2019-03-27 NOTE — Progress Notes (Addendum)
Cardiology Office Note:    Date:  03/27/2019   ID:  Donna [name], DOB [birth year], MRN [id number]  PCP:  Minette Brine, FNP  Cardiologist:  No primary care provider on file.  Electrophysiologist:  None   Referring MD: Minette Brine, FNP   No chief complaint on file.   History of Present Illness:    [name] is a 69 y.o. female with a hx of right brain stroke in April 2020.  She was seen by Dr.Acharya in July for cardiac work-up.  Echocardiogram showed preserved LV function with an EF of greater than 65%. She did have mild to moderate ASH.   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  The patient had to be changed from Plavix to Brilinta based on her P2Y12 level.    She presented to the off 03/04/2019 without an appointment asking for pre op clearance for RMCA stenting.  We had no official request or communication from Dr Estanislado Pandy either. The patient did complain of chest discomfort and after discussion with Dr Margaretann Loveless it was decided to proceed with coronary CTA which is scheduled for 04/15/2018. The patient was hypertensive as well and Hydralazine was added.   After I saw the patient 03/04/2019 she apparently was seen at [facility] urgent care a few days later with cough and loss of smell and taste.  She says she was tested for COVID but never heard from them.  When I went to look up these records in [place] there is apparently some discrepancy about her address and no records were available. We knew nothing of this till today's visit.  She did tell me that she now feels fine. Her sister accompanied her today, she takes the patient to her appointments.  I spoke to her sister at length on the phone after the visit.     Past Medical History:  Diagnosis Date  . Anemia    low iron - not since having fibroids  removed  . Arthritis   . Diabetes mellitus without complication ([place])   . Hyperlipidemia   . Hypertension   . Sleep apnea    just recently diagnosed with it, has not gotten the Cpap (done 10/29/18)  . Stroke [facility])    weakness on left side    Past Surgical History:  Procedure Laterality Date  . COLONOSCOPY    . IR ANGIO INTRA EXTRACRAN SEL COM CAROTID INNOMINATE BILAT MOD SED  10/18/2018  . IR ANGIO VERTEBRAL SEL VERTEBRAL BILAT MOD SED  10/18/2018  . IR US GUIDE VASC ACCESS RIGHT  10/18/2018  . MYOMECTOMY      Current Medications: Current Meds  Medication Sig  . amLODipine (NORVASC) 10 MG tablet Take 1 tablet by mouth once daily  . aspirin EC 81 MG tablet Take 81 mg by mouth every evening.  . blood glucose meter kit and supplies KIT Dispense based on patient and insurance preference. Use up to four times daily as directed. (FOR ICD-9 250.00, 250.01).  . Calcium Carbonate-Vitamin D (CALCIUM-D PO) Take 2 tablets by mouth daily at 12 noon.   . carvedilol (COREG) 6.25 MG tablet Take 1 tablet (6.25 mg total) by mouth 2 (two) times daily.  . hydrALAZINE (APRESOLINE) 25 MG tablet Take 1 tablet (25 mg total) by mouth 3 (three) times daily.  . Insulin  Glargine-Lixisenatide (SOLIQUA) 100-33 UNT-MCG/ML SOPN Inject 25 Units into the skin daily.  . Multiple Vitamin (MULTIVITAMIN PO) Take 1 tablet by mouth daily at 12 noon.   . Omega-3 Fatty Acids (OMEGA-3 PLUS PO) Take 2 capsules by mouth daily at 12 noon.  Marland Kitchen telmisartan-hydrochlorothiazide (MICARDIS HCT) 40-12.5 MG tablet Take 1 tablet by mouth once daily  . ticagrelor (BRILINTA) 90 MG TABS tablet Take 90 mg by mouth 2 (two) times daily.   . vitamin C (ASCORBIC ACID) 250 MG tablet Take 500 mg by mouth daily at 12 noon.  . [DISCONTINUED] SOLIQUA 100-33 UNT-MCG/ML SOPN INJECT 20  SUBCUTANEOUSLY INTO THE SKIN  AT BEDTIME (Patient taking differently: Pt inject 25 Subcutaneously into skin)     Allergies:   Patient has no known allergies.   Social  History   Socioeconomic History  . Marital status: Legally Separated    Spouse name: Not on file  . Number of children: Not on file  . Years of education: Not on file  . Highest education level: Not on file  Occupational History  . Occupation: retired  Tobacco Use  . Smoking status: Never Smoker  . Smokeless tobacco: Never Used  Substance and Sexual Activity  . Alcohol use: No  . Drug use: No  . Sexual activity: Not Currently  Other Topics Concern  . Not on file  Social History Narrative   Lives at home alone   Right handed   Social Determinants of Health   Financial Resource Strain:   . Difficulty of Paying Living Expenses: Not on file  Food Insecurity: No Food Insecurity  . Worried About Charity fundraiser in the Last Year: Never true  . Ran Out of Food in the Last Year: Never true  Transportation Needs: No Transportation Needs  . Lack of Transportation (Medical): No  . Lack of Transportation (Non-Medical): No  Physical Activity:   . Days of Exercise per Week: Not on file  . Minutes of Exercise per Session: Not on file  Stress:   . Feeling of Stress : Not on file  Social Connections:   . Frequency of Communication with Friends and Family: Not on file  . Frequency of Social Gatherings with Friends and Family: Not on file  . Attends Religious Services: Not on file  . Active Member of Clubs or Organizations: Not on file  . Attends Archivist Meetings: Not on file  . Marital Status: Not on file     Family History: The patient's family history includes Diabetes in her father and sister; Stroke in her mother. There is no history of Breast cancer.  ROS:   Please see the history of present illness.     All other systems reviewed and are negative.  EKGs/Labs/Other Studies Reviewed:    The following studies were reviewed today: Echo 10/08/2018  EKG:  EKG is not ordered today.    Recent Labs: 04/04/2018: TSH 0.568 10/07/2018: ALT 20 11/29/2018: BUN 18;  Creatinine, Ser 0.98; Hemoglobin 13.5; Platelets 290; Potassium 3.8; Sodium 136  Recent Lipid Panel    Component Value Date/Time   CHOL 271 (H) 10/07/2018 1259   TRIG 201 (H) 10/07/2018 1259   HDL 60 10/07/2018 1259   CHOLHDL 4.5 (H) 10/07/2018 1259   LDLCALC 171 (H) 10/07/2018 1259    Physical Exam:    VS:  BP (!) 156/76   Pulse 70   Temp (!) 97.3 F (36.3 C)   Ht 5' (1.524 m)   Wt 156  lb (70.8 kg)   SpO2 97%   BMI 30.47 kg/m     Wt Readings from Last 3 Encounters:  03/27/19 156 lb (70.8 kg)  03/19/19 165 lb (74.8 kg)  03/04/19 165 lb (74.8 kg)     GEN: Overweight AA female, well developed in no acute distress HEENT: Normal NECK: No JVD;  High pitched LCA bruit CARDIAC: RRR, no murmurs, rubs, gallops RESPIRATORY:  Clear to auscultation without rales, wheezing or rhonchi  ABDOMEN: Soft, non-tender, non-distended MUSCULOSKELETAL:  No edema; No deformity  SKIN: Warm and dry NEUROLOGIC:  Alert and oriented x 3 PSYCHIATRIC:  Normal affect   ASSESSMENT:    History of CVA (cerebrovascular accident) April 2020- Rt brain CVA with Lt sided weakness  PVD (peripheral vascular disease) ([place]) Carotid and RMCA disease  Essential hypertension Better control, EF > 65% with ASH on echo July 2020.  COVID-19 virus infection By her history she had COVID in late Dec- early Jan.  I will contact [facility] to try and get her COVID test results- it may affect her pre coronary CTA COVID test.   PLAN:    Obtain COVID test results from [facility]. No change in Rx for now. Proceed with coronary CTA 2/3/20221 for pre op clearance.  I will reschedule her OV with dr Margaretann Loveless next week for 1-2 weeks after her coronary CTA.   Discussed at length with her sister Joaquim Lai- [phone]  Addendum:  COVID test results received from Urgent care and will be scanned into the chart- date of service 03/10/2019- positive COVID 19.  The patient's name was spelled wrong which led to  confusion about the results.   Kerin Ransom PA-C 03/27/2019 12:56 PM  Medication Adjustments/Labs and Tests Ordered: Current medicines are reviewed at length with the patient today.  Concerns regarding medicines are outlined above.  No orders of the defined types were placed in this encounter.  No orders of the defined types were placed in this encounter.   Patient Instructions  Medication Instructions:  Your physician recommends that you continue on your current medications as directed. Please refer to the Current Medication list given to you today. *If you need a refill on your cardiac medications before your next appointment, please call your pharmacy*  Lab Work: None  If you have labs (blood work) drawn today and your tests are completely normal, you will receive your results only by: Marland Kitchen MyChart Message (if you have MyChart) OR . A paper copy in the mail If you have any lab test that is abnormal or we need to change your treatment, we will call you to review the results.  Testing/Procedures: None   Follow-Up: At [facility], you and your health needs are our priority.  As part of our continuing mission to provide you with exceptional heart care, we have created designated Provider Care Teams.  These Care Teams include your primary Cardiologist (physician) and Advanced Practice Providers (APPs -  Physician Assistants and Nurse Practitioners) who all work together to provide you with the care you need, when you need it.  Your next appointment:   After coronary CT  The format for your next appointment:   Virtual Visit   Provider:   Cherlynn Kaiser, MD  Other Instructions     Signed, Kerin Ransom, PA-C  03/27/2019 11:46 AM    [place]

## 2019-03-27 NOTE — Assessment & Plan Note (Signed)
April 2020- Rt brain CVA with Lt sided weakness

## 2019-03-27 NOTE — Assessment & Plan Note (Signed)
Carotid and RMCA disease

## 2019-03-28 ENCOUNTER — Ambulatory Visit: Payer: Medicare HMO | Admitting: Cardiology

## 2019-03-31 DIAGNOSIS — R079 Chest pain, unspecified: Secondary | ICD-10-CM | POA: Diagnosis not present

## 2019-03-31 LAB — BASIC METABOLIC PANEL
BUN/Creatinine Ratio: 20 (ref 12–28)
BUN: 21 mg/dL (ref 8–27)
CO2: 23 mmol/L (ref 20–29)
Calcium: 10 mg/dL (ref 8.7–10.3)
Chloride: 97 mmol/L (ref 96–106)
Creatinine, Ser: 1.04 mg/dL — ABNORMAL HIGH (ref 0.57–1.00)
GFR calc Af Amer: 64 mL/min/{1.73_m2} (ref >59)
GFR calc non Af Amer: 56 mL/min/{1.73_m2} — ABNORMAL LOW (ref >59)
Glucose: 412 mg/dL — ABNORMAL HIGH (ref 65–99)
Potassium: 4.3 mmol/L (ref 3.5–5.2)
Sodium: 136 mmol/L (ref 134–144)

## 2019-04-04 ENCOUNTER — Telehealth: Payer: Medicare HMO | Admitting: Internal Medicine

## 2019-04-08 ENCOUNTER — Encounter: Payer: Medicare HMO | Admitting: Nurse Practitioner

## 2019-04-09 ENCOUNTER — Ambulatory Visit: Payer: Medicare HMO | Admitting: Pharmacist

## 2019-04-09 NOTE — Progress Notes (Signed)
  Chronic Care Management   Outreach Note  04/09/2019 Name: Donna [name] MRN: [id number] DOB: 1950/11/10  Referred by: Arnette Felts, FNP Reason for referral : Chronic Care Management   An unsuccessful telephone outreach was attempted today. The patient was referred to the case management team by for assistance with care management and care coordination.   Follow Up Plan: A HIPPA compliant phone message was left for the patient providing contact information and requesting a return call.  The care management team will reach out to the patient again over the next 10-14 days.   SIGNATURE Kieth Brightly, PharmD, BCPS Clinical Pharmacist, [facility]  [facility]  Direct Dial: [phone]

## 2019-04-10 ENCOUNTER — Ambulatory Visit: Payer: Medicare HMO | Admitting: Nurse Practitioner

## 2019-04-13 NOTE — Progress Notes (Signed)
Not seen

## 2019-04-15 ENCOUNTER — Telehealth (HOSPITAL_COMMUNITY): Payer: Self-pay | Admitting: Emergency Medicine

## 2019-04-15 NOTE — Telephone Encounter (Signed)
Reaching out to patient to offer assistance regarding upcoming cardiac imaging study; pt verbalizes understanding of appt date/time, parking situation and where to check in, pre-test NPO status and medications ordered, and verified current allergies; name and call back number provided for further questions should they arise Saida Lonon RN Navigator Cardiac Imaging [facility] [phone] office [phone] cell 

## 2019-04-16 ENCOUNTER — Other Ambulatory Visit: Payer: Self-pay

## 2019-04-16 ENCOUNTER — Ambulatory Visit (HOSPITAL_COMMUNITY)
Admission: RE | Admit: 2019-04-16 | Discharge: 2019-04-16 | Disposition: A | Discharge status: Home / Self Care | Payer: Medicare HMO | Source: Ambulatory Visit | Attending: Cardiology | Admitting: Cardiology

## 2019-04-16 DIAGNOSIS — R918 Other nonspecific abnormal finding of lung field: Secondary | ICD-10-CM | POA: Diagnosis not present

## 2019-04-16 DIAGNOSIS — R911 Solitary pulmonary nodule: Secondary | ICD-10-CM | POA: Insufficient documentation

## 2019-04-16 DIAGNOSIS — R079 Chest pain, unspecified: Secondary | ICD-10-CM | POA: Diagnosis not present

## 2019-04-16 MED ORDER — NITROGLYCERIN 0.4 MG SL SUBL
SUBLINGUAL_TABLET | SUBLINGUAL | Status: AC
  Filled 2019-04-16: qty 2

## 2019-04-16 MED ORDER — NITROGLYCERIN 0.4 MG SL SUBL: 0.8000 mg | SUBLINGUAL_TABLET | Freq: Once | SUBLINGUAL | Status: DC

## 2019-04-16 MED ORDER — IOHEXOL 350 MG/ML SOLN
80.0000 mL | Freq: Once | INTRAVENOUS | Status: AC | PRN
  Administered 2019-04-16: 80 mL via INTRAVENOUS

## 2019-04-16 NOTE — Progress Notes (Signed)
Evaluation after Contrast Extravasation  Patient seen and examined immediately after contrast extravasation while in CT. Approximately 40 cc contrast and 40 cc saline into RUE per tech.  Exam: There is mild swelling of the RUE near IV site.  There is no erythema. There is no discoloration. There are no blisters. There are no signs of decreased perfusion of the skin.  It is warm to touch.  The patient has normal ROM in fingers.  Radial pulse 2+ bilaterally.  Per contrast extravasation protocol, I have instructed the patient to keep an ice pack on the area for 20-60 minutes at a time for about 48 hours.   Keep arm elevated as much as possible.   The patient understands to call her PCP for same day appointment or go to ED (if her PCP cannot see her) if there is: - increase in pain or swelling - changed or altered sensation - ulceration or blistering - increasing redness - warmth or increasing firmness - decreased tissue perfusion as noted by decreased capillary refill or discoloration of skin - decreased pulses peripheral to site   Elwin Mocha PA-C 04/16/2019 4:29 PM

## 2019-04-21 ENCOUNTER — Ambulatory Visit
Admission: RE | Admit: 2019-04-21 | Discharge: 2019-04-21 | Disposition: A | Discharge status: Home / Self Care | Payer: Medicare HMO | Source: Ambulatory Visit | Attending: Nurse Practitioner | Admitting: Nurse Practitioner

## 2019-04-21 ENCOUNTER — Other Ambulatory Visit: Payer: Self-pay | Admitting: *Deleted

## 2019-04-21 ENCOUNTER — Other Ambulatory Visit: Payer: Self-pay

## 2019-04-21 DIAGNOSIS — Z1231 Encounter for screening mammogram for malignant neoplasm of breast: Secondary | ICD-10-CM | POA: Diagnosis not present

## 2019-04-21 DIAGNOSIS — Z01810 Encounter for preprocedural cardiovascular examination: Secondary | ICD-10-CM

## 2019-04-21 DIAGNOSIS — R079 Chest pain, unspecified: Secondary | ICD-10-CM

## 2019-04-21 NOTE — Progress Notes (Signed)
Eustace Pen, RN  Good afternoon Jasmine December   Can I get a new Ct order for Ms Parmer, she had a problem with her IV (contrast extravasation while in CT. Approximately 40 cc contrast and 40 cc saline into RUE per tech)   Thank you  Bernadene Bell  order as requested

## 2019-04-22 ENCOUNTER — Telehealth: Payer: Medicare HMO | Admitting: Internal Medicine

## 2019-04-22 ENCOUNTER — Telehealth: Payer: Self-pay

## 2019-04-22 ENCOUNTER — Telehealth: Payer: Self-pay | Admitting: *Deleted

## 2019-04-22 DIAGNOSIS — R079 Chest pain, unspecified: Secondary | ICD-10-CM

## 2019-04-22 DIAGNOSIS — Z01810 Encounter for preprocedural cardiovascular examination: Secondary | ICD-10-CM

## 2019-04-22 NOTE — Telephone Encounter (Signed)
LEFT MESSAGE FOR PATIENT WOULD LIKE TO RESCHEDULE DUE TO PATIENT NOT HAVING  CCTA TEST COMPLETED

## 2019-04-22 NOTE — Telephone Encounter (Signed)
-----   Message from Abelino Derrick, [place] sent at 04/22/2019  7:56 AM EST ----- No I'll reschedule her as a Myoview-   Tee please schedule-(chest pain with a moderate risk, unable to exercise,pre op clearance)  Corine Shelter PA-C 04/22/2019 7:59 AM    ----- Message ----- From: Lennie Odor, RN Sent: 04/17/2019   2:18 PM EST To: Laurey Morale, MD, Abelino Derrick, PA-C, #  Hey! I was just notified by IR about this patient and why her study was incomplete. She was a difficult IV start so they needed IV team to help. IV team started an 18g with ultrasound guidance. Unfortunetaly her IV still infilrated and 80cc of contrast extravasated in her arm. (you can see the contrast in her arm in some of the images in pacs)  Do you want [place] to try to re-scan her once shes recovered from this contrast incident? If we want to try again, I will suggest a micropuncture by IR radiologist.   We can probably get a calcium score from the non-con images  [place]well [place]

## 2019-04-22 NOTE — Telephone Encounter (Signed)
Called patient to inform her that we need to do a lexiscan. Patient did not answer the phone. Left a message for patient to contact the office. Tried to reach patients son but he did not answer and no voicemail box was set up.   Will wait for patient to contact office.

## 2019-04-23 ENCOUNTER — Telehealth: Payer: Medicare HMO | Admitting: Pharmacist

## 2019-04-23 NOTE — Telephone Encounter (Signed)
Patient has been notified and voiced understanding.

## 2019-04-28 ENCOUNTER — Ambulatory Visit: Payer: Self-pay | Admitting: Pharmacist

## 2019-04-28 DIAGNOSIS — Z794 Long term (current) use of insulin: Secondary | ICD-10-CM

## 2019-04-28 DIAGNOSIS — E119 Type 2 diabetes mellitus without complications: Secondary | ICD-10-CM

## 2019-04-28 NOTE — Progress Notes (Signed)
  Chronic Care Management   Outreach Note  04/28/2019 Name: Donna [name] MRN: [id number] DOB: 06-Mar-1951  Referred by: Arnette Felts, FNP Reason for referral : Chronic Care Management   An unsuccessful telephone outreach was attempted today. The patient was referred to the case management team for assistance with care management and care coordination.  Voicemail box was full, therefore I was unable to leave a message  Follow Up Plan: The care management team will reach out to the patient again over the next 10 days.   SIGNATURE  Kieth Brightly, PharmD, BCPS Clinical Pharmacist, [facility]  [facility]  Direct Dial: [phone]

## 2019-04-29 ENCOUNTER — Telehealth: Payer: Self-pay

## 2019-04-29 NOTE — Telephone Encounter (Signed)
I called pt to see what test strips she is needing so that we can send it to the pharmacy. I left pt v/m to call the office. YRL,RMA

## 2019-05-07 ENCOUNTER — Telehealth: Payer: Medicare HMO | Admitting: Pharmacist

## 2019-05-08 ENCOUNTER — Telehealth (HOSPITAL_COMMUNITY): Payer: Self-pay

## 2019-05-08 NOTE — Telephone Encounter (Signed)
Encounter complete. 

## 2019-05-12 ENCOUNTER — Other Ambulatory Visit: Payer: Self-pay

## 2019-05-12 MED ORDER — TELMISARTAN-HCTZ 40-12.5 MG PO TABS: 1.0000 | ORAL_TABLET | Freq: Every day | ORAL | 0 refills | Status: DC

## 2019-05-12 MED ORDER — CARVEDILOL 6.25 MG PO TABS: 6.2500 mg | ORAL_TABLET | Freq: Two times a day (BID) | ORAL | 0 refills | Status: DC

## 2019-05-12 MED ORDER — AMLODIPINE BESYLATE 10 MG PO TABS: 10.0000 mg | ORAL_TABLET | Freq: Every day | ORAL | 0 refills | Status: AC

## 2019-05-13 ENCOUNTER — Ambulatory Visit (HOSPITAL_COMMUNITY)
Admission: RE | Admit: 2019-05-13 | Payer: Medicare HMO | Source: Ambulatory Visit | Attending: Cardiology | Admitting: Cardiology

## 2019-05-13 ENCOUNTER — Other Ambulatory Visit: Payer: Self-pay

## 2019-05-15 ENCOUNTER — Telehealth (HOSPITAL_COMMUNITY): Payer: Self-pay

## 2019-05-15 NOTE — Telephone Encounter (Signed)
Unable to reach patient. Encounter complete. 

## 2019-05-16 ENCOUNTER — Telehealth (HOSPITAL_COMMUNITY): Payer: Self-pay

## 2019-05-16 NOTE — Telephone Encounter (Signed)
Encounter complete. 

## 2019-05-19 ENCOUNTER — Telehealth: Payer: Self-pay | Admitting: Cardiology

## 2019-05-19 NOTE — Telephone Encounter (Signed)
New Message   *STAT* If patient is at the pharmacy, call can be transferred to refill team.   1. Which medications need to be refilled? (please list name of each medication and dose if known)  hydrALAZINE (APRESOLINE) 25 MG tablet  2. Which pharmacy/location (including street and city if local pharmacy) is medication to be sent to?  [id number] - [place], [place] - [address]  3. Do they need a 30 day or 90 day supply? 90 day supply

## 2019-05-20 ENCOUNTER — Ambulatory Visit (HOSPITAL_COMMUNITY)
Admission: RE | Admit: 2019-05-20 | Discharge: 2019-05-20 | Disposition: A | Discharge status: Home / Self Care | Payer: Medicare HMO | Source: Ambulatory Visit | Attending: Internal Medicine | Admitting: Internal Medicine

## 2019-05-20 ENCOUNTER — Other Ambulatory Visit: Payer: Self-pay

## 2019-05-20 DIAGNOSIS — Z01810 Encounter for preprocedural cardiovascular examination: Secondary | ICD-10-CM | POA: Diagnosis not present

## 2019-05-20 DIAGNOSIS — R079 Chest pain, unspecified: Secondary | ICD-10-CM | POA: Diagnosis not present

## 2019-05-20 LAB — MYOCARDIAL PERFUSION IMAGING
LV dias vol: 72 mL (ref 46–106)
LV sys vol: 20 mL
Peak HR: 100 {beats}/min
Rest HR: 62 {beats}/min
SDS: 4
SRS: 2
SSS: 6
TID: 0.96

## 2019-05-20 MED ORDER — REGADENOSON 0.4 MG/5ML IV SOLN
0.4000 mg | Freq: Once | INTRAVENOUS | Status: AC
  Administered 2019-05-20: 0.4 mg via INTRAVENOUS

## 2019-05-20 MED ORDER — TECHNETIUM TC 99M TETROFOSMIN IV KIT
10.5000 | PACK | Freq: Once | INTRAVENOUS | Status: AC | PRN
  Administered 2019-05-20: 10.5 via INTRAVENOUS
  Filled 2019-05-20: qty 11

## 2019-05-20 MED ORDER — TECHNETIUM TC 99M TETROFOSMIN IV KIT
31.1000 | PACK | Freq: Once | INTRAVENOUS | Status: AC | PRN
  Administered 2019-05-20: 31.1 via INTRAVENOUS
  Filled 2019-05-20: qty 32

## 2019-05-21 ENCOUNTER — Other Ambulatory Visit: Payer: Self-pay | Admitting: Internal Medicine

## 2019-05-21 NOTE — Telephone Encounter (Signed)
*  STAT* If patient is at the pharmacy, call can be transferred to refill team.   1. Which medications need to be refilled? (please list name of each medication and dose if known) hydrALAZINE (APRESOLINE) 25 MG tablet  2. Which pharmacy/location (including street and city if local pharmacy) is medication to be sent to? [id number] - [place], [place] - [address]  3. Do they need a 30 day or 90 day supply? 30

## 2019-05-22 ENCOUNTER — Ambulatory Visit (INDEPENDENT_AMBULATORY_CARE_PROVIDER_SITE_OTHER): Payer: Medicare HMO | Admitting: Nurse Practitioner

## 2019-05-22 ENCOUNTER — Other Ambulatory Visit: Payer: Self-pay

## 2019-05-22 ENCOUNTER — Telehealth: Payer: Self-pay

## 2019-05-22 ENCOUNTER — Ambulatory Visit (INDEPENDENT_AMBULATORY_CARE_PROVIDER_SITE_OTHER): Payer: Medicare HMO | Admitting: Pharmacist

## 2019-05-22 ENCOUNTER — Encounter: Payer: Self-pay | Admitting: Nurse Practitioner

## 2019-05-22 VITALS — BP 128/76 | HR 68 | Temp 97.9°F | Ht 61.2 in | Wt 159.2 lb

## 2019-05-22 DIAGNOSIS — I10 Essential (primary) hypertension: Secondary | ICD-10-CM

## 2019-05-22 DIAGNOSIS — E119 Type 2 diabetes mellitus without complications: Secondary | ICD-10-CM

## 2019-05-22 DIAGNOSIS — E11319 Type 2 diabetes mellitus with unspecified diabetic retinopathy without macular edema: Secondary | ICD-10-CM | POA: Diagnosis not present

## 2019-05-22 DIAGNOSIS — Z794 Long term (current) use of insulin: Secondary | ICD-10-CM

## 2019-05-22 DIAGNOSIS — E785 Hyperlipidemia, unspecified: Secondary | ICD-10-CM

## 2019-05-22 DIAGNOSIS — R252 Cramp and spasm: Secondary | ICD-10-CM

## 2019-05-22 MED ORDER — TRUE METRIX METER W/DEVICE KIT: 1.0000 | PACK | Freq: Four times a day (QID) | 0 refills | Status: AC

## 2019-05-22 MED ORDER — TRUE METRIX BLOOD GLUCOSE TEST VI STRP: ORAL_STRIP | 4 refills | Status: AC

## 2019-05-22 MED ORDER — HYDRALAZINE HCL 25 MG PO TABS: 25.0000 mg | ORAL_TABLET | Freq: Three times a day (TID) | ORAL | 0 refills | Status: DC

## 2019-05-22 NOTE — Patient Instructions (Signed)
Make sure you provide [place] with proof of income so we can help you with getting medications for free.  We will take you information to have you get your covid vaccine at the end of the month

## 2019-05-22 NOTE — Telephone Encounter (Signed)
Follow Up ° °Patient is returning call. Please give patient a call back.  °

## 2019-05-22 NOTE — Telephone Encounter (Addendum)
Tried calling patient to go over her results and could not leave a voice message due to voicemail box is full will try calling patient again.  ----- Message from Abelino Derrick, PA-C sent at 05/20/2019  4:46 PM EST ----- Please let Ms Swaim know her stress test looked normal.  She should keep her f/u with Dr Jacques Navy as scheduled for pre op clearance for her right middle cerebral artery stenting by Dr Link Snuffer.  Corine Shelter PA-C 05/20/2019 4:46 PM

## 2019-05-22 NOTE — Progress Notes (Signed)
Subjective:     Patient ID: Donna [name] , female    DOB: 1950/10/27 , 69 y.o.   MRN: [id number]   Chief Complaint  Patient presents with  . Diabetes    HPI  She is living with her son She is to have a procedure to her cerebral vessels. Her sister Joaquim Lai - after the CT Scan on 3/17 they will know when to do the procedure   Hypertension This is a chronic problem. The current episode started more than 1 year ago. The problem has been gradually improving since onset. Condition status: improving. Pertinent negatives include no anxiety, chest pain, headaches or palpitations. There are no associated agents to hypertension. Risk factors for coronary artery disease include obesity, sedentary lifestyle, diabetes mellitus and dyslipidemia. Past treatments include calcium channel blockers and beta blockers. The current treatment provides moderate improvement.  Diabetes She presents for her follow-up diabetic visit. She has type 2 diabetes mellitus. Her disease course has been stable. There are no hypoglycemic associated symptoms. Pertinent negatives for hypoglycemia include no dizziness or headaches. There are no diabetic associated symptoms. Pertinent negatives for diabetes include no chest pain. There are no hypoglycemic complications. Symptoms are stable. There are no diabetic complications. Risk factors for coronary artery disease include sedentary lifestyle, dyslipidemia and diabetes mellitus. Current diabetic treatment includes oral agent (dual therapy). She is compliant with treatment some of the time. (She has not been able to check her blood sugar due to not having any test strips)     Past Medical History:  Diagnosis Date  . Anemia    low iron - not since having fibroids removed  . Arthritis   . Diabetes mellitus without complication ([place])   . Hyperlipidemia   . Hypertension   . Sleep apnea    just recently diagnosed with it, has not gotten the Cpap (done 10/29/18)  . Stroke [facility])     weakness on left side     Family History  Problem Relation Age of Onset  . Stroke Mother   . Diabetes Father   . Diabetes Sister   . Breast cancer Neg Hx      Current Outpatient Medications:  .  amLODipine (NORVASC) 10 MG tablet, Take 1 tablet (10 mg total) by mouth daily., Disp: 90 tablet, Rfl: 0 .  aspirin EC 81 MG tablet, Take 81 mg by mouth every evening., Disp: , Rfl:  .  blood glucose meter kit and supplies KIT, Dispense based on patient and insurance preference. Use up to four times daily as directed. (FOR ICD-9 250.00, 250.01)., Disp: 1 each, Rfl: 0 .  Calcium Carbonate-Vitamin D (CALCIUM-D PO), Take 2 tablets by mouth daily at 12 noon. , Disp: , Rfl:  .  carvedilol (COREG) 6.25 MG tablet, Take 1 tablet (6.25 mg total) by mouth 2 (two) times daily., Disp: 180 tablet, Rfl: 0 .  hydrALAZINE (APRESOLINE) 25 MG tablet, Take 1 tablet (25 mg total) by mouth 3 (three) times daily., Disp: 60 tablet, Rfl: 1 .  Insulin Glargine-Lixisenatide (SOLIQUA) 100-33 UNT-MCG/ML SOPN, Inject 25 Units into the skin daily., Disp: , Rfl:  .  Multiple Vitamin (MULTIVITAMIN PO), Take 1 tablet by mouth daily at 12 noon. , Disp: , Rfl:  .  Omega-3 Fatty Acids (OMEGA-3 PLUS PO), Take 2 capsules by mouth daily at 12 noon., Disp: , Rfl:  .  telmisartan-hydrochlorothiazide (MICARDIS HCT) 40-12.5 MG tablet, Take 1 tablet by mouth daily., Disp: 90 tablet, Rfl: 0 .  ticagrelor (BRILINTA) 90  MG TABS tablet, Take 90 mg by mouth 2 (two) times daily. , Disp: , Rfl:  .  vitamin C (ASCORBIC ACID) 250 MG tablet, Take 500 mg by mouth daily at 12 noon., Disp: , Rfl:    No Known Allergies   Review of Systems  Constitutional: Negative.   Respiratory: Negative.  Negative for cough.   Cardiovascular: Negative.  Negative for chest pain, palpitations and leg swelling.  Musculoskeletal:       Toe cramping and foot cramping  Neurological: Negative.  Negative for dizziness and headaches.  Psychiatric/Behavioral: Negative.   Negative for agitation.     Today's Vitals   05/22/19 1036  BP: 128/76  Pulse: 68  Temp: 97.9 F (36.6 C)  TempSrc: Oral  Weight: 159 lb 3.2 oz (72.2 kg)  Height: 5' 1.2" (1.554 m)   Body mass index is 29.88 kg/m.   Objective:  Physical Exam Constitutional:      Appearance: Normal appearance.  Cardiovascular:     Rate and Rhythm: Normal rate and regular rhythm.     Pulses: Normal pulses.     Heart sounds: Normal heart sounds. No murmur.  Pulmonary:     Effort: Pulmonary effort is normal. No respiratory distress.     Breath sounds: Normal breath sounds.  Skin:    Capillary Refill: Capillary refill takes less than 2 seconds.  Neurological:     General: No focal deficit present.     Mental Status: She is alert and oriented to person, place, and time.         Assessment And Plan:     1. Type 2 diabetes mellitus without complication, with long-term current use of insulin (HCC)  Chronic, poorly controlled on Soliqua  Will check with [name] CCM pharmacist to see if can help with Xulotophy  Also updated her sister who was present at the visit.  2. Essential hypertension  Her blood pressure is better today but she continues to be slightly elevated.  She is awaiting for her blood pressure to improve before she can have her arteriogram.  I will reach out to Dr. Bronson Curb office to see what parameters he is looking for and for how long  She is having her blood pressure managed by cardiology  3. Hyperlipidemia, unspecified hyperlipidemia type Chronic, fair control     Minette Brine, FNP    THE PATIENT IS ENCOURAGED TO PRACTICE SOCIAL DISTANCING DUE TO THE COVID-19 PANDEMIC.

## 2019-05-23 LAB — CMP14+EGFR
ALT: 19 IU/L (ref 0–32)
AST: 15 IU/L (ref 0–40)
Albumin/Globulin Ratio: 1.1 — ABNORMAL LOW (ref 1.2–2.2)
Albumin: 4 g/dL (ref 3.8–4.8)
Alkaline Phosphatase: 74 IU/L (ref 39–117)
BUN/Creatinine Ratio: 18 (ref 12–28)
BUN: 18 mg/dL (ref 8–27)
Bilirubin Total: 0.3 mg/dL (ref 0.0–1.2)
CO2: 29 mmol/L (ref 20–29)
Calcium: 10.2 mg/dL (ref 8.7–10.3)
Chloride: 99 mmol/L (ref 96–106)
Creatinine, Ser: 0.98 mg/dL (ref 0.57–1.00)
GFR calc Af Amer: 69 mL/min/{1.73_m2} (ref >59)
GFR calc non Af Amer: 60 mL/min/{1.73_m2} (ref >59)
Globulin, Total: 3.5 g/dL (ref 1.5–4.5)
Glucose: 131 mg/dL — ABNORMAL HIGH (ref 65–99)
Potassium: 4.4 mmol/L (ref 3.5–5.2)
Sodium: 141 mmol/L (ref 134–144)
Total Protein: 7.5 g/dL (ref 6.0–8.5)

## 2019-05-23 LAB — LIPID PANEL
Chol/HDL Ratio: 3.3 ratio (ref 0.0–4.4)
Cholesterol, Total: 217 mg/dL — ABNORMAL HIGH (ref 100–199)
HDL: 65 mg/dL (ref >39)
LDL Chol Calc (NIH): 132 mg/dL — ABNORMAL HIGH (ref 0–99)
Triglycerides: 114 mg/dL (ref 0–149)
VLDL Cholesterol Cal: 20 mg/dL (ref 5–40)

## 2019-05-23 LAB — HEMOGLOBIN A1C
Est. average glucose Bld gHb Est-mCnc: 275 mg/dL
Hgb A1c MFr Bld: 11.2 % — ABNORMAL HIGH (ref 4.8–5.6)

## 2019-05-23 NOTE — Telephone Encounter (Signed)
Returned called to the patient to discuss her recent stress test results and could not leave a message. Will try calling patient again.

## 2019-05-23 NOTE — Patient Instructions (Addendum)
Visit Information  Goals Addressed            This Visit's Progress     Patient Stated   . I need to control & monitor my blood sugar (pt-stated)       Current Barriers:  . Diabetes: T2DM; most recent A1c 11.3% today (has stayed elevated)   . Current antihyperglycemic regimen: Soliqua 25 units daily (reports compliance) o Will have to apply for [facility] given ease of patient assistance program ( no out of pocket requirements)--application completed, awaiting patient's financial documents--discussed w/ PCP o Patient appears to have compliance and access to medication issues.   . Denies hypoglycemic symptoms . Reports hyperglycemic symptoms: headaches, polydipsia . Current meal patterns: son is cooking lower carbohydrate meals o Drinks: sugary drinks, trying to drink more water . Current exercise: trying to walk . Current blood glucose readings: FBG 170-200s . Cardiovascular risk reduction: o Current hypertensive regimen: amlodipine (now taking), carvedilol, telmisartan/hctz o Current hyperlipidemia regimen: n/a-->statin intolerance (appt with lipid clinic to arrange Repatha--no appt noted past or future) o Reinforce importance of daily aspirin.  Patient not taking as presribed.  She is now taking Brilinta.  She reports that she has "failed Plavix" (noted in clinical documentation as well)  Pharmacist Clinical Goal(s):  Marland Kitchen Over the next 90 days, patient with work with PharmD and primary care provider to address optimized medication & disease state management (diabetes).  Interventions: . Comprehensive medication review performed, medication list updated in electronic medical record o Follow ADA recommended "diabetes-friendly" diet  (reviewed healthy snack/food options).  Mailed diabetes materials and snack food options.  Suggested low/zero sugar drinks for patient to try.  She states she is having trouble drinking water. o Discussed insulin/GLP-1 injection  technique/instructions. o Reviewed medication purpose/side effects-->patient denies adverse events, denies hypoglycemia  Patient Self Care Activities:  . Patient will check blood glucose daily, document, and provide at future appointments . Patient will focus on medication adherence by taking all medications as prescribed. . Patient will take medications as prescribed . Patient will contact provider with any episodes of hypoglycemia . Patient will report any questions or concerns to provider    Please see past updates related to this goal by clicking on the "Past Updates" button in the selected goal         The patient verbalized understanding of instructions provided today and declined a print copy of patient instruction materials.   The care management team will reach out to the patient again over the next 5 days.   SIGNATURE Kieth Brightly, PharmD, BCPS Clinical Pharmacist, [facility]  [facility]  Direct Dial: [phone]

## 2019-05-23 NOTE — Progress Notes (Signed)
Chronic Care Management   Visit Note  05/22/2019 Name: Donna [name] MRN: [id number] DOB: 1950-12-20  Referred by: Minette Brine, FNP Reason for referral : Chronic Care Management and Diabetes   Donna [name] is a 69 y.o. year old female who is a primary care patient of Minette Brine, [place]. The CCM team was consulted for assistance with chronic disease management and care coordination needs related to DMII  Review of patient status, including review of consultants reports, relevant laboratory and other test results, and collaboration with appropriate care team members and the patient's provider was performed as part of comprehensive patient evaluation and provision of chronic care management services.    SDOH (Social Determinants of Health) assessments performed: Yes See Care Plan activities for detailed interventions related to SDOH)     Medications: Outpatient Encounter Medications as of 05/22/2019  Medication Sig Note  . amLODipine (NORVASC) 10 MG tablet Take 1 tablet (10 mg total) by mouth daily.   Marland Kitchen aspirin EC 81 MG tablet Take 81 mg by mouth every evening.   . blood glucose meter kit and supplies KIT Dispense based on patient and insurance preference. Use up to four times daily as directed. (FOR ICD-9 250.00, 250.01). 06/13/2018: Bought ReliON meter from [employer]    . Blood Glucose Monitoring Suppl (TRUE METRIX METER) w/Device KIT 1 each by Does not apply route in the morning, at noon, in the evening, and at bedtime.   . Calcium Carbonate-Vitamin D (CALCIUM-D PO) Take 2 tablets by mouth daily at 12 noon.    . carvedilol (COREG) 6.25 MG tablet Take 1 tablet (6.25 mg total) by mouth 2 (two) times daily.   Marland Kitchen glucose blood (TRUE METRIX BLOOD GLUCOSE TEST) test strip Check blood sugar 4 times a day before meals and bedtime   . hydrALAZINE (APRESOLINE) 25 MG tablet Take 1 tablet (25 mg total) by mouth 3 (three) times daily.   . Insulin Glargine-Lixisenatide (SOLIQUA) 100-33 UNT-MCG/ML  SOPN Inject 25 Units into the skin daily.   . Multiple Vitamin (MULTIVITAMIN PO) Take 1 tablet by mouth daily at 12 noon.    . Omega-3 Fatty Acids (OMEGA-3 PLUS PO) Take 2 capsules by mouth daily at 12 noon.   Marland Kitchen telmisartan-hydrochlorothiazide (MICARDIS HCT) 40-12.5 MG tablet Take 1 tablet by mouth daily.   . ticagrelor (BRILINTA) 90 MG TABS tablet Take 90 mg by mouth 2 (two) times daily.    . vitamin C (ASCORBIC ACID) 250 MG tablet Take 500 mg by mouth daily at 12 noon.    No facility-administered encounter medications on file as of 05/22/2019.     Objective:   Goals Addressed            This Visit's Progress     Patient Stated   . I need to control & monitor my blood sugar (pt-stated)       Current Barriers:  . Diabetes: T2DM; most recent A1c 11.3% today (has stayed elevated)   . Current antihyperglycemic regimen: Soliqua 25 units daily (reports compliance) o Will have to apply for [facility] given ease of patient assistance program ( no out of pocket requirements)--application completed, awaiting patient's financial documents--discussed w/ PCP o Patient appears to have compliance and access to medication issues.   o Case discussed with CCM RN CM and CCM BSW for additional collaboration . Denies hypoglycemic symptoms . Reports hyperglycemic symptoms: headaches, polydipsia . Current meal patterns: son is cooking lower carbohydrate meals o Drinks: sugary drinks, trying to drink more water .  Current exercise: trying to walk . Current blood glucose readings: FBG 170-200s . Cardiovascular risk reduction: o Current hypertensive regimen: amlodipine (now taking), carvedilol, telmisartan/hctz o Current hyperlipidemia regimen: n/a-->statin intolerance (appt with lipid clinic to arrange Repatha--no appt noted past or future) o Reinforce importance of daily aspirin.  Patient not taking as presribed.  She is now taking Brilinta.  She reports that she has "failed Plavix" (noted in clinical  documentation as well)  Pharmacist Clinical Goal(s):  Marland Kitchen Over the next 90 days, patient with work with PharmD and primary care provider to address optimized medication & disease state management (diabetes).  Interventions: . Comprehensive medication review performed, medication list updated in electronic medical record o Follow ADA recommended "diabetes-friendly" diet  (reviewed healthy snack/food options).  Mailed diabetes materials and snack food options.  Suggested low/zero sugar drinks for patient to try.  She states she is having trouble drinking water. o Discussed insulin/GLP-1 injection technique/instructions. o Reviewed medication purpose/side effects-->patient denies adverse events, denies hypoglycemia  Patient Self Care Activities:  . Patient will check blood glucose daily, document, and provide at future appointments . Patient will focus on medication adherence by taking all medications as prescribed. . Patient will take medications as prescribed . Patient will contact provider with any episodes of hypoglycemia . Patient will report any questions or concerns to provider    Please see past updates related to this goal by clicking on the "Past Updates" button in the selected goal         Plan:   The care management team will reach out to the patient again over the next 30 days.   Provider Signature Regina Eck, PharmD, BCPS Clinical Pharmacist, [facility] [place]: [phone]

## 2019-05-27 ENCOUNTER — Ambulatory Visit: Payer: Self-pay

## 2019-05-27 ENCOUNTER — Telehealth: Payer: Medicare HMO

## 2019-05-27 DIAGNOSIS — Z794 Long term (current) use of insulin: Secondary | ICD-10-CM

## 2019-05-27 DIAGNOSIS — E119 Type 2 diabetes mellitus without complications: Secondary | ICD-10-CM

## 2019-05-27 NOTE — Telephone Encounter (Signed)
Follow up   Patient is returning call to get myocardial results.  Please call.

## 2019-05-27 NOTE — Progress Notes (Signed)
She needs to find out from [facility] PA if she needs to continue he is at the cardiologist office and his last note indicates she should be taking 2 times a day

## 2019-05-27 NOTE — Chronic Care Management (AMB) (Signed)
  Chronic Care Management   Outreach Note  05/27/2019 Name: Donna [name] MRN: [id number] DOB: 1950/12/11  Referred by: Arnette Felts, FNP Reason for referral : Care Coordination   SW placed an unsuccessful outreach call to the patient to conduct an SDOH (social determinants of health) screen and assist with care coordination needs. Unfortunately, the patients voice mailbox is full which prohibited SW from leaving a HIPAA compliant voice message requesting a return call.  Follow Up Plan: The care management team will reach out to the patient again over the next 14 days.   Bevelyn Ngo, BSW, CDP Social Worker, Certified Dementia Practitioner [facility] Care Management [phone]

## 2019-05-28 ENCOUNTER — Ambulatory Visit (HOSPITAL_COMMUNITY): Payer: Medicare HMO

## 2019-05-29 ENCOUNTER — Telehealth: Payer: Medicare HMO

## 2019-05-29 ENCOUNTER — Ambulatory Visit: Payer: Self-pay

## 2019-05-29 ENCOUNTER — Other Ambulatory Visit: Payer: Self-pay

## 2019-05-29 DIAGNOSIS — E11319 Type 2 diabetes mellitus with unspecified diabetic retinopathy without macular edema: Secondary | ICD-10-CM | POA: Diagnosis not present

## 2019-05-29 DIAGNOSIS — I10 Essential (primary) hypertension: Secondary | ICD-10-CM

## 2019-05-29 DIAGNOSIS — E119 Type 2 diabetes mellitus without complications: Secondary | ICD-10-CM

## 2019-05-29 DIAGNOSIS — E785 Hyperlipidemia, unspecified: Secondary | ICD-10-CM

## 2019-05-29 DIAGNOSIS — Z794 Long term (current) use of insulin: Secondary | ICD-10-CM

## 2019-05-30 ENCOUNTER — Telehealth (INDEPENDENT_AMBULATORY_CARE_PROVIDER_SITE_OTHER): Payer: Medicare HMO | Admitting: Internal Medicine

## 2019-05-30 ENCOUNTER — Encounter: Payer: Self-pay | Admitting: Internal Medicine

## 2019-05-30 ENCOUNTER — Telehealth: Payer: Self-pay | Admitting: *Deleted

## 2019-05-30 VITALS — BP 169/80 | Ht 61.0 in | Wt 155.0 lb

## 2019-05-30 DIAGNOSIS — Z8673 Personal history of transient ischemic attack (TIA), and cerebral infarction without residual deficits: Secondary | ICD-10-CM | POA: Diagnosis not present

## 2019-05-30 DIAGNOSIS — I10 Essential (primary) hypertension: Secondary | ICD-10-CM

## 2019-05-30 DIAGNOSIS — R0683 Snoring: Secondary | ICD-10-CM | POA: Diagnosis not present

## 2019-05-30 DIAGNOSIS — R079 Chest pain, unspecified: Secondary | ICD-10-CM

## 2019-05-30 DIAGNOSIS — I779 Disorder of arteries and arterioles, unspecified: Secondary | ICD-10-CM

## 2019-05-30 DIAGNOSIS — G4733 Obstructive sleep apnea (adult) (pediatric): Secondary | ICD-10-CM | POA: Diagnosis not present

## 2019-05-30 DIAGNOSIS — Z7189 Other specified counseling: Secondary | ICD-10-CM

## 2019-05-30 DIAGNOSIS — E119 Type 2 diabetes mellitus without complications: Secondary | ICD-10-CM

## 2019-05-30 DIAGNOSIS — I739 Peripheral vascular disease, unspecified: Secondary | ICD-10-CM

## 2019-05-30 DIAGNOSIS — Z794 Long term (current) use of insulin: Secondary | ICD-10-CM

## 2019-05-30 DIAGNOSIS — Z9989 Dependence on other enabling machines and devices: Secondary | ICD-10-CM | POA: Diagnosis not present

## 2019-05-30 DIAGNOSIS — R002 Palpitations: Secondary | ICD-10-CM

## 2019-05-30 MED ORDER — HYDRALAZINE HCL 50 MG PO TABS: 50.0000 mg | ORAL_TABLET | Freq: Two times a day (BID) | ORAL | 1 refills | Status: DC

## 2019-05-30 NOTE — Telephone Encounter (Signed)
Left a message for the patient to call back if she had nay questions about her virtual visit today with Dr. Jacques Navy. The AVS will be mailed.

## 2019-05-30 NOTE — Progress Notes (Signed)
Virtual Visit via Telephone Note   This visit type was conducted due to national recommendations for restrictions regarding the COVID-19 Pandemic (e.g. social distancing) in an effort to limit this patient's exposure and mitigate transmission in our community.  Due to her co-morbid illnesses, this patient is at least at moderate risk for complications without adequate follow up.  This format is felt to be most appropriate for this patient at this time.  The patient did not have access to video technology/had technical difficulties with video requiring transitioning to audio format only (telephone).  All issues noted in this document were discussed and addressed.  No physical exam could be performed with this format.  Please refer to the patient's chart for her  consent to telehealth for [facility].   The patient was identified using 2 identifiers.  Date:  05/30/2019   ID:  [name], DOB Dec 12, 1950, MRN [id number]  Patient Location: Home Provider Location: Home  PCP:  Arnette Felts, FNP  Cardiologist:  No primary care provider on file.  Electrophysiologist:  None   Evaluation Performed:  Follow-Up Visit  Chief Complaint:  F/u cv risk factors with recent stroke  History of Present Illness:    ANNALEIGH STEINMEYER is a 69 y.o. female with hx of stroke, anemia, DM, HLD, HTN, OSA. Echocardiogram showed preserved LV function with an EF of greater than 65%. She did have mild to moderate ASH. 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  She described chest pain and CCTA was planned however she had contrast extravasation and a nuc stress test was ordered instead. Normal Myoview. She is chest pain free and overall feels well.   BP suboptimally  controlled, 169/80 while we are talking on the phone, repeat was the same. She denies headaches. Taking medications as prescribed.  The patient does not have symptoms concerning for COVID-19 infection (fever, chills, cough, or new shortness of breath).    Past Medical History:  Diagnosis Date  . Anemia    low iron - not since having fibroids removed  . Arthritis   . Diabetes mellitus without complication (HCC)   . Hyperlipidemia   . Hypertension   . Sleep apnea    just recently diagnosed with it, has not gotten the Cpap (done 10/29/18)  . Stroke [facility])    weakness on left side   Past Surgical History:  Procedure Laterality Date  . COLONOSCOPY    . IR ANGIO INTRA EXTRACRAN SEL COM CAROTID INNOMINATE BILAT MOD SED  10/18/2018  . IR ANGIO VERTEBRAL SEL VERTEBRAL BILAT MOD SED  10/18/2018  . IR US GUIDE VASC ACCESS RIGHT  10/18/2018  . MYOMECTOMY       Current Meds  Medication Sig  . amLODipine (NORVASC) 10 MG tablet Take 1 tablet (10 mg total) by mouth daily.  Marland Kitchen aspirin EC 81 MG tablet Take 81 mg by mouth every evening.  . Calcium Carbonate-Vitamin D (CALCIUM-D PO) Take 2 tablets by mouth daily at 12 noon.   . carvedilol (COREG) 6.25 MG tablet Take 1 tablet (6.25 mg total) by mouth 2 (two) times daily.  . Multiple Vitamin (MULTIVITAMIN PO) Take 1 tablet by mouth daily at 12 noon.   . Omega-3 Fatty Acids (OMEGA-3 PLUS PO) Take 2 capsules by mouth  daily at 12 noon.  Marland Kitchen telmisartan-hydrochlorothiazide (MICARDIS HCT) 40-12.5 MG tablet Take 1 tablet by mouth daily.  . ticagrelor (BRILINTA) 90 MG TABS tablet Take 90 mg by mouth 2 (two) times daily.   . vitamin C (ASCORBIC ACID) 250 MG tablet Take 500 mg by mouth daily at 12 noon.     Allergies:   Patient has no known allergies.   Social History   Tobacco Use  . Smoking status: Never Smoker  . Smokeless tobacco: Never Used  Substance Use Topics  . Alcohol use: No  . Drug use: No     Family Hx: The patient's family history includes  Diabetes in her father and sister; Stroke in her mother. There is no history of Breast cancer.  ROS:   Please see the history of present illness.     All other systems reviewed and are negative.   Prior CV studies:   The following studies were reviewed today:    Labs/Other Tests and Data Reviewed:    EKG:  No ECG reviewed.  Recent Labs: 11/29/2018: Hemoglobin 13.5; Platelets 290 05/22/2019: ALT 19; BUN 18; Creatinine, Ser 0.98; Potassium 4.4; Sodium 141   Recent Lipid Panel Lab Results  Component Value Date/Time   CHOL 217 (H) 05/22/2019 01:12 PM   TRIG 114 05/22/2019 01:12 PM   HDL 65 05/22/2019 01:12 PM   CHOLHDL 3.3 05/22/2019 01:12 PM   LDLCALC 132 (H) 05/22/2019 01:12 PM    Wt Readings from Last 3 Encounters:  05/30/19 155 lb (70.3 kg)  05/22/19 159 lb 3.2 oz (72.2 kg)  05/20/19 156 lb (70.8 kg)     Objective:    Vital Signs:  BP (!) 169/80   Ht 5\' 1"  (1.549 m)   Wt 155 lb (70.3 kg)   BMI 29.29 kg/m    VITAL SIGNS:  reviewed GEN:  no acute distress RESPIRATORY:  normal respiratory effort, no increased work of breathing NEURO:  alert and oriented x 3, speech normal PSYCH:  normal affect  ASSESSMENT & PLAN:    Stroke - needs R MCA stenting, but requires cardiovascular optimization. BP suboptimal, will continue to address. She does not have a date for her procedure yet, I have recommended that she get in touch with Dr. office or neurology to revisit the issue. Will be happy to comment on optimization once a procedural date is set.   Palpitations -  Monitor not completed. No significant palpitations at this time.   Statin intolerance - will arrange for lipid clinic for discussion of pcsk9 inhibitor for treatment of hyperlipidemia and cardiovascular risk reduction. Not addressed since our last visit in July.  HTN - increase hydralazine to 50 mg BID. Continue carvedilol 6.25 mg daily, continue Micardis HCT and amlodipine, BP is suboptimally  controlled at this time.   Snoring - Has osa, encouraged compliance with cpap.  COVID-19 Education: The signs and symptoms of COVID-19 were discussed with the patient and how to seek care for testing (follow up with PCP or arrange E-visit).  The importance of social distancing was discussed today.  Time:   Today, I have spent 25 minutes with the patient with telehealth technology discussing the above problems.     Medication Adjustments/Labs and Tests Ordered: Current medicines are reviewed at length with the patient today.  Concerns regarding medicines are outlined above.   Tests Ordered: No orders of the defined types were placed in this encounter.   Medication Changes: Meds ordered this encounter  Medications  .  hydrALAZINE (APRESOLINE) 50 MG tablet    Sig: Take 1 tablet (50 mg total) by mouth 2 (two) times daily.    Dispense:  60 tablet    Refill:  1    Follow Up:  6 weeks in office for pre procedural CV optimization.  Signed, Elouise Munroe, MD  05/30/2019 7:13 PM    [facility]

## 2019-05-30 NOTE — Patient Instructions (Addendum)
Medication Instructions:  INCREASE the Hydralazine to 50 mg twice daily  *If you need a refill on your cardiac medications before your next appointment, please call your pharmacy*   Lab Work: None ordered If you have labs (blood work) drawn today and your tests are completely normal, you will receive your results only by: Marland Kitchen MyChart Message (if you have MyChart) OR . A paper copy in the mail If you have any lab test that is abnormal or we need to change your treatment, we will call you to review the results.   Testing/Procedures: None ordered   Follow-Up: At [facility], you and your health needs are our priority.  As part of our continuing mission to provide you with exceptional heart care, we have created designated Provider Care Teams.  These Care Teams include your primary Cardiologist (physician) and Advanced Practice Providers (APPs -  Physician Assistants and Nurse Practitioners) who all work together to provide you with the care you need, when you need it.  We recommend signing up for the patient portal called "MyChart".  Sign up information is provided on this After Visit Summary.  MyChart is used to connect with patients for Virtual Visits (Telemedicine).  Patients are able to view lab/test results, encounter notes, upcoming appointments, etc.  Non-urgent messages can be sent to your provider as well.   To learn more about what you can do with MyChart, go to [email or web address].    Your next appointment:   6 week(s)  The format for your next appointment:   In Person  Provider:   Weston Brass, MD   Other Instructions Dr. Jacques Navy would like for you to be seen in the Lipid Clinic with our pharmacists to discuss starting a PCSK9, lowering cholesterol medication.

## 2019-06-02 ENCOUNTER — Ambulatory Visit: Payer: Self-pay

## 2019-06-02 ENCOUNTER — Telehealth: Payer: Medicare HMO

## 2019-06-02 DIAGNOSIS — Z794 Long term (current) use of insulin: Secondary | ICD-10-CM

## 2019-06-02 DIAGNOSIS — E119 Type 2 diabetes mellitus without complications: Secondary | ICD-10-CM

## 2019-06-02 NOTE — Chronic Care Management (AMB) (Signed)
  Chronic Care Management   Outreach Note  06/02/2019 Name: Donna [name] MRN: [id number] DOB: [birth year]  Referred by: Arnette Felts, FNP Reason for referral : Care Coordination   SW placed a second unsuccessful outbound call to the patient in an attempt to conduct a social determinants of health screen. Unfortunately, the patients voice mailbox is currently full which prohibited SW from leaving a message requesting a return call.  Follow Up Plan: The care management team will reach out to the patient again over the next 10 days.   Bevelyn Ngo, BSW, CDP Social Worker, Certified Dementia Practitioner [facility] Care Management [phone]

## 2019-06-03 NOTE — Patient Instructions (Signed)
Visit Information  Goals Addressed      Patient Stated   . "I know I need some help with my sugar" (pt-stated)       Current Barriers:  Marland Kitchen Knowledge Deficits related to long term plan for management of DM . Knowledge Deficits related to recommended diet/exercise plan . Non-adherence to Self monitoring CBG's   Nurse Case Manager Clinical Goal(s): Over the next 90 days, patient will work with [facility] to establish long term plan of care and self health management strategies for DM disease management. Goal Not Met . 12/18/18: Over the next 90 days, patient will work with the CCM team to develop a Diabetes disease management plan to improve her knowledge and understanding of how to improve Self Health management of DM Goal Not Met . New 05/30/19 Over the next 90 days, patient will achieve improved daily glycemic control with FBS 80-130, after meals <190 and lower her A1C < 11.2  CCM RN CM Interventions:  05/30/19 call completed with patient   . Evaluation of current treatment plan related to Diabetes Mellitus and patient's adherence to plan as established by provider . Determined patient is monitoring her CBG's 3-4 times per day and continues to have increased readings . Reviewed and discussed patient's medications for DM; Soliqua 25 units daily (reports compliance) . Will have to apply for [facility] given ease of patient assistance program ( no out of pocket requirements)--application completed, embedded Pharm D awaiting patient's financial documents . Reiterated importance of adhering to ADA diet, maintaining weight per recommended BMI, staying active with routine exercise plan, with goal for 150 minutes per week  . Printed DM patient educational materials to be mailed to patient for review and follow up at next call  . Discussed plans with patient for ongoing care management follow up and provided patient with direct contact information for care management team  Patient Self Care Activities with  son and sister's assistance . Self administers medications as prescribed . Attends all scheduled provider appointments . Calls pharmacy for medication refills . Attends church or other social activities . Performs ADL's independently . Performs IADL's independently . Calls provider office for new concerns or questions   Please see past updates related to this goal by clicking on the "Past Updates" button in the selected goal      . COMPLETED: "I need help getting my medicine straightened out" (pt-stated)       Current Barriers:  Marland Kitchen Knowledge Deficits related to long term management of medications . Financial restraints; out of pocket cost for Xultrophy is $350 per 30 day supply   Nurse Case Manager Clinical Goal(s):  Marland Kitchen Over the next 30 days, patient will work with [facility] and/or pharmacist to establish long term plan for self management of prescribed medications Goal Met  CCM RN CM Interventions:  05/30/19 call completed with patient  . Determined patient has completed face to face interaction with embedded Pharm D Lottie Dawson for counsel on medication management . Determined patient feels satisfied and comfortable with her medication management at this time is adhering to her treatment plan   Patient Self Care Activities:   Verbalizes understanding of instructions/information provided today  . Self administers medications   Plan:  Follow up to ensure clinical pharmacist Lottie Dawson is able to assist patient with resources that may help with lowering the cost of Xultrophy  Please see past updates related to this goal by clicking on the "Past Updates" button in the selected goal      . "  I'll have my son check my BP" (pt-stated)       Current Barriers:  Marland Kitchen Knowledge Deficits related to Hypertension disease process, potential complications related to uncontrolled BP and importance of Self monitoring BP   Nurse Case Manager Clinical Goal(s):  Marland Kitchen Over the next 90 days, patient will  verbalize understanding of plan for having son assist with Self monitoring her BP at home and logging her readings  Goal Met . New - 01/14/19 Over the next 14 days, patient will meet with the [facility] staff and or embedded Pharm D to have home BP cuff checked for accuracy Goal Met New 05/30/19 Over the next 90 days, patient will maintain blood pressure at less than 130/80 mm/Hg with lifestyle modifications, and medications as directed by prescribing MD  CCM RN CM Interventions:  01/14/19 call completed with patient  . Evaluation of current treatment plan related to Hypertension and patient's adherence to plan as established by provider . Determined patient is Self monitoring her BP at home and has achieved adequate BP control reaching her target BP of <130/80 . Determined patient is taking her prescribed BP medications exactly as directed with no noted SE . Discussed patient will continue to Self monitor her BP's as directed and will alert the CCM RN and or PCP of abnormal BP readings promptly  . Discussed plans with patient for ongoing care management follow up and provided patient with direct contact information for care management team  Patient Self Care Activities with son's assistance . Self administers medications as prescribed . Attends all scheduled provider appointments . Calls pharmacy for medication refills . Performs ADL's independently . Performs IADL's independently . Calls provider office for new concerns or questions  Please see past updates related to this goal by clicking on the "Past Updates" button in the selected goal      . I need help paying for my medications (pt-stated)       Current Barriers:  . Financial Barriers: patient has [employer] and reports copay for [employer] ($134/month) is cost prohibitive at this time  Pharmacist Clinical Goal(s):  Marland Kitchen Over the next 30 days, patient will work with PharmD and providers to relieve medication access  concerns  Interventions: . Comprehensive medication review completed; medication list updated in electronic medical record.  Laurel Dimmer by [employer]: Patient meets income/No out of pocket spend criteria for this medication's patient assistance program. Reviewed application process. Patient will provide proof of income, out of pocket spend report, and will sign application. Will collaborate with primary care provider, Minette Brine, DNP, FNP for their portion of application. Once completed, will submit to [employer] patient assistance program. . 03/12/19: Patient states she is awaiting letter from [employer] in order to submit form.  Encouraged pt to bring in next week.  CCM RN CM Interventions: 05/30/19 call completed with patient . Instructed patient to provide the embedded Pharm D of her SS benefit letter and or recent bank statement printed on her banks letter head with the most recent SS deposit noted . Discussed once this has been completed her application for financial assistance for [facility] [employer] can be completed; patient verbalizes understanding  and plans to gather this information asap  Patient Self Care Activities:  . Patient will provide necessary portions of application   Please see past updates related to this goal by clicking on the "Past Updates" button in the selected goal        Patient verbalizes understanding of instructions provided today.  Telephone follow up appointment with care management team member scheduled for:06/20/19  Barb Merino, RN, BSN, CCM Care Management Coordinator [place] [facility]  Direct Phone: [phone]

## 2019-06-03 NOTE — Chronic Care Management (AMB) (Signed)
Chronic Care Management   Follow Up Note   05/29/2019 Name: JASMINEMARIE SHERRARD MRN: [id number] DOB: 14-Jul-1950  Referred by: Minette Brine, FNP Reason for referral : Chronic Care Management (FU Call - DM/Cardiac follow up)   TARAH BUBOLTZ is a 69 y.o. year old female who is a primary care patient of Minette Brine, [place]. The CCM team was consulted for assistance with chronic disease management and care coordination needs.    Review of patient status, including review of consultants reports, relevant laboratory and other test results, and collaboration with appropriate care team members and the patient's provider was performed as part of comprehensive patient evaluation and provision of chronic care management services.    SDOH (Social Determinants of Health) assessments performed: Yes  See Care Plan activities for detailed interventions related to [place])   Placed outbound CCM follow up call to patient for a CCM RN CM update.     Outpatient Encounter Medications as of 05/29/2019  Medication Sig Note  . amLODipine (NORVASC) 10 MG tablet Take 1 tablet (10 mg total) by mouth daily.   Marland Kitchen aspirin EC 81 MG tablet Take 81 mg by mouth every evening.   . blood glucose meter kit and supplies KIT Dispense based on patient and insurance preference. Use up to four times daily as directed. (FOR ICD-9 250.00, 250.01). 06/13/2018: Bought ReliON meter from [employer]    . Blood Glucose Monitoring Suppl (TRUE METRIX METER) w/Device KIT 1 each by Does not apply route in the morning, at noon, in the evening, and at bedtime.   . Calcium Carbonate-Vitamin D (CALCIUM-D PO) Take 2 tablets by mouth daily at 12 noon.    . carvedilol (COREG) 6.25 MG tablet Take 1 tablet (6.25 mg total) by mouth 2 (two) times daily.   Marland Kitchen glucose blood (TRUE METRIX BLOOD GLUCOSE TEST) test strip Check blood sugar 4 times a day before meals and bedtime   . Insulin Glargine-Lixisenatide (SOLIQUA) 100-33 UNT-MCG/ML SOPN Inject 25 Units into the  skin daily.   . Multiple Vitamin (MULTIVITAMIN PO) Take 1 tablet by mouth daily at 12 noon.    . Omega-3 Fatty Acids (OMEGA-3 PLUS PO) Take 2 capsules by mouth daily at 12 noon.   Marland Kitchen telmisartan-hydrochlorothiazide (MICARDIS HCT) 40-12.5 MG tablet Take 1 tablet by mouth daily.   . ticagrelor (BRILINTA) 90 MG TABS tablet Take 90 mg by mouth 2 (two) times daily.    . vitamin C (ASCORBIC ACID) 250 MG tablet Take 500 mg by mouth daily at 12 noon.   . [DISCONTINUED] hydrALAZINE (APRESOLINE) 25 MG tablet Take 1 tablet (25 mg total) by mouth 3 (three) times daily. (Patient taking differently: Take 25 mg by mouth 2 (two) times daily. )    No facility-administered encounter medications on file as of 05/29/2019.     Objective:  Lab Results  Component Value Date   HGBA1C 11.2 (H) 05/22/2019   HGBA1C 11.3 (H) 11/27/2018   HGBA1C 12.3 (H) 10/07/2018   Lab Results  Component Value Date   MICROALBUR 80 01/08/2019   LDLCALC 132 (H) 05/22/2019   CREATININE 0.98 05/22/2019   BP Readings from Last 3 Encounters:  05/30/19 (!) 169/80  05/22/19 128/76  04/16/19 (!) 183/56    Goals Addressed      Patient Stated   . "I know I need some help with my sugar" (pt-stated)       Current Barriers:  Marland Kitchen Knowledge Deficits related to long term plan for management of DM .  Knowledge Deficits related to recommended diet/exercise plan . Non-adherence to Self monitoring CBG's   Nurse Case Manager Clinical Goal(s): Over the next 90 days, patient will work with [facility] to establish long term plan of care and self health management strategies for DM disease management. Goal Not Met . 12/18/18: Over the next 90 days, patient will work with the CCM team to develop a Diabetes disease management plan to improve her knowledge and understanding of how to improve Self Health management of DM Goal Not Met . New 05/30/19 Over the next 90 days, patient will achieve improved daily glycemic control with FBS 80-130, after meals <190  and lower her A1C < 11.2  CCM RN CM Interventions:  05/30/19 call completed with patient   . Evaluation of current treatment plan related to Diabetes Mellitus and patient's adherence to plan as established by provider . Determined patient is monitoring her CBG's 3-4 times per day and continues to have increased readings . Reviewed and discussed patient's medications for DM; Soliqua 25 units daily (reports compliance) . Will have to apply for [facility] given ease of patient assistance program ( no out of pocket requirements)--application completed, embedded Pharm D awaiting patient's financial documents . Reiterated importance of adhering to ADA diet, maintaining weight per recommended BMI, staying active with routine exercise plan, with goal for 150 minutes per week  . Printed DM patient educational materials to be mailed to patient for review and follow up at next call  . Discussed plans with patient for ongoing care management follow up and provided patient with direct contact information for care management team  Patient Self Care Activities with son and sister's assistance . Self administers medications as prescribed . Attends all scheduled provider appointments . Calls pharmacy for medication refills . Attends church or other social activities . Performs ADL's independently . Performs IADL's independently . Calls provider office for new concerns or questions  Please see past updates related to this goal by clicking on the "Past Updates" button in the selected goal      . COMPLETED: "I need help getting my medicine straightened out" (pt-stated)       Current Barriers:  Marland Kitchen Knowledge Deficits related to long term management of medications . Financial restraints; out of pocket cost for Xultrophy is $350 per 30 day supply   Nurse Case Manager Clinical Goal(s):  Marland Kitchen Over the next 30 days, patient will work with [facility] and/or pharmacist to establish long term plan for self management of  prescribed medications Goal Met  CCM RN CM Interventions:  05/30/19 call completed with patient  . Determined patient has completed face to face interaction with embedded Pharm D Lottie Dawson for counsel on medication management . Determined patient feels satisfied and comfortable with her medication management at this time is adhering to her treatment plan   Patient Self Care Activities:   Verbalizes understanding of instructions/information provided today  . Self administers medications   Plan:  Follow up to ensure clinical pharmacist Lottie Dawson is able to assist patient with resources that may help with lowering the cost of Xultrophy  Please see past updates related to this goal by clicking on the "Past Updates" button in the selected goal      . "I'll have my son check my BP" (pt-stated)       Current Barriers:  Marland Kitchen Knowledge Deficits related to Hypertension disease process, potential complications related to uncontrolled BP and importance of Self monitoring BP   Nurse Case Manager Clinical  Goal(s):  Marland Kitchen Over the next 90 days, patient will verbalize understanding of plan for having son assist with Self monitoring her BP at home and logging her readings  Goal Met . New - 01/14/19 Over the next 14 days, patient will meet with the [facility] staff and or embedded Pharm D to have home BP cuff checked for accuracy Goal Met New 05/30/19 Over the next 90 days, patient will maintain blood pressure at less than 130/80 mm/Hg with lifestyle modifications, and medications as directed by prescribing MD  CCM RN CM Interventions:  01/14/19 call completed with patient  . Evaluation of current treatment plan related to Hypertension and patient's adherence to plan as established by provider . Determined patient is Self monitoring her BP at home and has achieved adequate BP control reaching her target BP of <130/80 . Determined patient is taking her prescribed BP medications exactly as directed with no  noted SE . Discussed patient will continue to Self monitor her BP's as directed and will alert the CCM RN and or PCP of abnormal BP readings promptly  . Discussed plans with patient for ongoing care management follow up and provided patient with direct contact information for care management team  Patient Self Care Activities with son's assistance . Self administers medications as prescribed . Attends all scheduled provider appointments . Calls pharmacy for medication refills . Performs ADL's independently . Performs IADL's independently . Calls provider office for new concerns or questions  Please see past updates related to this goal by clicking on the "Past Updates" button in the selected goal      . I need help paying for my medications (pt-stated)       Current Barriers:  . Financial Barriers: patient has [employer] and reports copay for [employer] ($134/month) is cost prohibitive at this time  Pharmacist Clinical Goal(s):  Marland Kitchen Over the next 30 days, patient will work with PharmD and providers to relieve medication access concerns  Interventions: . Comprehensive medication review completed; medication list updated in electronic medical record.  Laurel Dimmer by [employer]: Patient meets income/No out of pocket spend criteria for this medication's patient assistance program. Reviewed application process. Patient will provide proof of income, out of pocket spend report, and will sign application. Will collaborate with primary care provider, Minette Brine, DNP, FNP for their portion of application. Once completed, will submit to [employer] patient assistance program. . 03/12/19: Patient states she is awaiting letter from [facility] in order to submit form.  Encouraged pt to bring in next week.  CCM RN CM Interventions: 05/30/19 call completed with patient . Instructed patient to provide the embedded Pharm D of her SS benefit letter and or recent bank statement printed on her banks letter head with the  most recent SS deposit noted . Discussed once this has been completed her application for financial assistance for [facility] by [employer] can be completed; patient verbalizes understanding  and plans to gather this information asap  Patient Self Care Activities:  . Patient will provide necessary portions of application   Please see past updates related to this goal by clicking on the "Past Updates" button in the selected goal        Plan:   Telephone follow up appointment with care management team member scheduled for:06/20/19  Barb Merino, RN, BSN, CCM Care Management Coordinator [place] [facility]  Direct Phone: [phone]

## 2019-06-04 ENCOUNTER — Other Ambulatory Visit: Payer: Self-pay

## 2019-06-06 ENCOUNTER — Other Ambulatory Visit: Payer: Self-pay | Admitting: Nurse Practitioner

## 2019-06-06 DIAGNOSIS — E2839 Other primary ovarian failure: Secondary | ICD-10-CM

## 2019-06-09 ENCOUNTER — Ambulatory Visit: Payer: Medicare HMO

## 2019-06-09 DIAGNOSIS — Z794 Long term (current) use of insulin: Secondary | ICD-10-CM

## 2019-06-09 DIAGNOSIS — I10 Essential (primary) hypertension: Secondary | ICD-10-CM

## 2019-06-09 DIAGNOSIS — E119 Type 2 diabetes mellitus without complications: Secondary | ICD-10-CM

## 2019-06-09 NOTE — Chronic Care Management (AMB) (Signed)
  Chronic Care Management    Social Work CCM Outreach Note  06/09/2019 Name: ESTEFANIA KAMIYA MRN: [id number] DOB: August 20, 1950  ALFONSO SHACKETT is a 69 y.o. year old female who is a primary care patient of Arnette Felts, FNP . The CCM team was consulted for assistance with care coordination.    SW placed a successful outbound call to the patient to conduct a SW assessment.  SDOH (Social Determinants of Health) assessments performed: Yes. No challenges noted at this time.   The patient reports needing medications refilled including Amlodipine and Brilinta. SW collaborated with Arnette Felts, FNP to address prescription refills.  Follow Up Plan: No SW follow up planned at this time. SW encouraged the patient to contact care management team with future care coordination needs.  Bevelyn Ngo, BSW, CDP Social Worker, Certified Dementia Practitioner [facility] Care Management [phone]

## 2019-06-10 ENCOUNTER — Encounter: Payer: Self-pay | Admitting: Pharmacy Technician

## 2019-06-12 ENCOUNTER — Other Ambulatory Visit: Payer: Self-pay | Admitting: Internal Medicine

## 2019-06-12 ENCOUNTER — Telehealth: Payer: Self-pay

## 2019-06-12 MED ORDER — TICAGRELOR 90 MG PO TABS: 90.0000 mg | ORAL_TABLET | Freq: Two times a day (BID) | ORAL | 1 refills | Status: DC

## 2019-06-12 NOTE — Telephone Encounter (Signed)
Patient called requesting a refill on Brilinta and I advised her to call her cardiologist so they can refill her med. YL,RMA

## 2019-06-12 NOTE — Telephone Encounter (Signed)
  *  STAT* If patient is at the pharmacy, call can be transferred to refill team.   1. Which medications need to be refilled? (please list name of each medication and dose if known) ticagrelor (BRILINTA) 90 MG TABS tablet  2. Which pharmacy/location (including street and city if local pharmacy) is medication to be sent to?[id number] - [place], [place] - [address]  3. Do they need a 30 day or 90 day supply? 90 days

## 2019-06-16 ENCOUNTER — Other Ambulatory Visit: Payer: Self-pay

## 2019-06-16 ENCOUNTER — Other Ambulatory Visit: Payer: Self-pay | Admitting: Internal Medicine

## 2019-06-16 MED ORDER — HYDRALAZINE HCL 50 MG PO TABS: 50.0000 mg | ORAL_TABLET | Freq: Two times a day (BID) | ORAL | 0 refills | Status: DC

## 2019-06-16 MED ORDER — SOLIQUA 100-33 UNT-MCG/ML ~~LOC~~ SOPN: 25.0000 [IU] | PEN_INJECTOR | Freq: Every day | SUBCUTANEOUS | 1 refills | Status: DC

## 2019-06-16 NOTE — Telephone Encounter (Signed)
New Message   *STAT* If patient is at the pharmacy, call can be transferred to refill team.   1. Which medications need to be refilled? (please list name of each medication and dose if known) hydrALAZINE (APRESOLINE) 50 MG tablet  2. Which pharmacy/location (including street and city if local pharmacy) is medication to be sent to? [id number] - [place], [place] - [address]  3. Do they need a 30 day or 90 day supply? 90 day

## 2019-06-16 NOTE — Telephone Encounter (Signed)
Returned call to pt.  She has requested 90 day supply of Hydralazine to [facility].  I let her know that we have taken care of that for her

## 2019-06-20 ENCOUNTER — Telehealth: Payer: Medicare HMO

## 2019-06-26 ENCOUNTER — Other Ambulatory Visit: Payer: Self-pay

## 2019-06-26 ENCOUNTER — Ambulatory Visit (INDEPENDENT_AMBULATORY_CARE_PROVIDER_SITE_OTHER): Payer: Medicare HMO | Admitting: Pharmacist Clinician (PhC)/ Clinical Pharmacy Specialist

## 2019-06-26 VITALS — BP 178/80 | HR 71 | Resp 16 | Ht 61.0 in | Wt 160.0 lb

## 2019-06-26 DIAGNOSIS — I739 Peripheral vascular disease, unspecified: Secondary | ICD-10-CM

## 2019-06-26 DIAGNOSIS — G720 Drug-induced myopathy: Secondary | ICD-10-CM

## 2019-06-26 DIAGNOSIS — I672 Cerebral atherosclerosis: Secondary | ICD-10-CM | POA: Diagnosis not present

## 2019-06-26 DIAGNOSIS — E785 Hyperlipidemia, unspecified: Secondary | ICD-10-CM

## 2019-06-26 DIAGNOSIS — T466X5A Adverse effect of antihyperlipidemic and antiarteriosclerotic drugs, initial encounter: Secondary | ICD-10-CM | POA: Insufficient documentation

## 2019-06-26 NOTE — Progress Notes (Signed)
06/27/2019 Donna [name] 03/13/1951 [id number]   HPI:  Donna [name] is a 69 y.o. female patient of Dr Margaretann Loveless, who presents today for a lipid clinic evaluation.  See pertinent past medical history below.  She was seen by Dr. Margaretann Loveless in March via telehealth visit.  Her blood pressure, cholesterol and diabetes are not well controlled at this time.  Because of her previous stroke she needs stenting of her R MCA, however cannot do this until has better control of CV concerns.    Today she is here to discuss cholesterol options. She has tried different statins but was unable to tolerate due to myalgias.    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  hypertension Not well controlled, 178/80 today - on amlodipine, carvedilol, micardis hct, hydralazine  DM2 3/21 A1c 11.2 - on Soliqua    Current Medications: none  Cholesterol Goals: LDL <70   Intolerant/previously tried: atorvastatin and simvastatin both caused myalgias, Welchol caused GI issues  Family history: mother had 2 strokes in her 17's, died with second, father with CABG x 3 in his 48's, lived to 10, multiple siblings, none currently with known ASCVD; 3 children, no known ASCVD  Diet: living with son, eating mostly home cooked meals, vegetables daily and mix of chicken, fish and [place]; no fried foods  Exercise:  No regular exercise  Labs: 3/21:  TC 217, TG 114, HDL 65, LDL 132  Current Outpatient Medications  Medication Sig Dispense Refill  . amLODipine (NORVASC) 10 MG tablet Take 1 tablet (10 mg total) by mouth daily. 90 tablet 0  . aspirin EC 81 MG tablet Take 81 mg by mouth every evening.    . blood glucose meter kit and supplies KIT Dispense based on patient and insurance preference. Use up to four times daily as directed. (FOR ICD-9 250.00, 250.01). 1 each 0  . Blood Glucose Monitoring Suppl (TRUE METRIX METER) w/Device KIT 1 each by Does not apply route in the morning,  at noon, in the evening, and at bedtime. 1 kit 0  . Calcium Carbonate-Vitamin D (CALCIUM-D PO) Take 2 tablets by mouth daily at 12 noon.     . carvedilol (COREG) 6.25 MG tablet Take 1 tablet (6.25 mg total) by mouth 2 (two) times daily. 180 tablet 0  . glucose blood (TRUE METRIX BLOOD GLUCOSE TEST) test strip Check blood sugar 4 times a day before meals and bedtime 300 each 4  . hydrALAZINE (APRESOLINE) 50 MG tablet Take 1 tablet (50 mg total) by mouth 2 (two) times daily. 180 tablet 0  . Insulin Glargine-Lixisenatide (SOLIQUA) 100-33 UNT-MCG/ML SOPN Inject 25 Units into the skin daily. 3 mL 1  . Multiple Vitamin (MULTIVITAMIN PO) Take 1 tablet by mouth daily at 12 noon.     . Omega-3 Fatty Acids (OMEGA-3 PLUS PO) Take 2 capsules by mouth daily at 12 noon.    Marland Kitchen telmisartan-hydrochlorothiazide (MICARDIS HCT) 40-12.5 MG tablet Take 1 tablet by mouth daily. 90 tablet 0  . ticagrelor (BRILINTA) 90 MG TABS tablet Take 1 tablet (90 mg total) by mouth 2 (two) times daily. 180 tablet 1  . vitamin C (ASCORBIC ACID) 250 MG tablet Take 500 mg by mouth daily at 12 noon.     No current facility-administered medications for this visit.    No Known Allergies  Past Medical History:  Diagnosis Date  . Anemia    low iron - not since having fibroids removed  .  Arthritis   . Diabetes mellitus without complication ([place])   . Hyperlipidemia   . Hypertension   . Sleep apnea    just recently diagnosed with it, has not gotten the Cpap (done 10/29/18)  . Stroke ([place])    weakness on left side    Blood pressure (!) 178/80, pulse 71, resp. rate 16, height 5' 1"  (1.549 m), weight 160 lb (72.6 kg), SpO2 98 %.   Hyperlipidemia Patient with history of ASCVD/CVA unable to tolerate statin drugs.  Reviewed options for patient including PCSK-9 inhibitors and bempedoic acid.  Patient agreeable to starting Repatha 140 mg.  Explained mechanism of action, side effects and potential decrease in LDL cholesterol.  Taught  injection technique.  Will start paperwork to get approval.  Patient to repeat labs after 3 months.     Tommy Medal PharmD CPP [facility] [address] [place], [place] [address] [phone]

## 2019-06-26 NOTE — Patient Instructions (Addendum)
Your Results:             Your most recent labs Goal  Total Cholesterol 217 < 200  Triglycerides 114 < 150  HDL (good cholesterol) 65 > 40  LDL (bad cholesterol 132 < 70      Medication changes:  We will start the paperwork to get Repatha covered by your insurance.  This usually takes 1-3 days.  Someone will call you once it is approved  - then you can go to the pharmacy to pick up.  Lab orders:  After 5-6 doses (about 3 months) we will call to remind you to get your cholesterol labs done  Patient Assistance:  The [employer] foundation offers assistance to help pay for medication copays.  They will cover copays for all cholesterol lowering meds, including statins, fibrates, omega-3 oils, ezetimibe, Repatha, Praluent, Nexletol, Nexlizet.  The cards are usually good for $2,500 or 12 months, whichever comes first. 1. Go to healthwellfoundation.org 2. Click on "Apply Now" 3. Answer questions as to whom is applying (patient or representative) 4. Your disease fund will be "hypercholesterolemia - Medicare access" 5. They will ask questions about finances and which medications you are taking for cholesterol 6. When you submit, the approval is usually within minutes.  You will need to print the card information from the site 7. You will need to show this information to your pharmacy, they will bill your Medicare Part D plan first -then bill [employer] --for the copay.   You can also call them at [phone], although the hold times can be quite long.   Thank you for choosing [facility]

## 2019-06-26 NOTE — Progress Notes (Deleted)
Patient ID: Donna [name]                 DOB: 22-Sep-1950                    MRN: [id number]     HPI: Donna [name] is a 69 y.o. female patient referred to lipid clinic by Dr Margaretann Loveless. PMH is significant for diabetes, hyperlipidemia, hypertension, OSA, and hx of stoke.   Current Medications: none  Intolerances:  Atorvastatin 52m daily Simvastatin 451mdaily Welchol 2.5g daily  LDL goal: < 7054mL  Diet:   Exercise:   Family History: The patient's family history includes Diabetes in her father and sister; Stroke in her mother. There is no history of Breast cancer.  Social History: denies tobacco and alcohol use  Labs: 05/22/2019: CHO 217, TG 114, HDL 65, LDL-c 132   Past Medical History:  Diagnosis Date  . Anemia    low iron - not since having fibroids removed  . Arthritis   . [place] Hyperlipidemia   . Hypertension   . Sleep apnea    just recently diagnosed with it, has not gotten the Cpap (done 10/29/18)  . [facility]  [facility]ness [facility]    Current Outpatient Medications on File Prior to Visit  Medication Sig Dispense Refill  . amLODipine (NORVASC) 10 MG tablet Take 1 tablet (10 mg total) by mouth daily. 90 tablet 0  . aspirin EC 81 MG tablet Take 81 mg by mouth every evening.    . blood glucose meter kit and supplies KIT Dispense based on patient and insurance preference. Use up to four times daily as directed. (FOR ICD-9 250.00, 250.01). 1 each 0  . Blood Glucose Monitoring Suppl (TRUE METRIX METER) w/Device KIT 1 each by Does not apply route in the morning, at noon, in the evening, and at bedtime. 1 kit 0  . Calcium Carbonate-Vitamin D (CALCIUM-D PO) Take 2 tablets by mouth daily at 12 noon.     . carvedilol (COREG) 6.25 MG tablet Take 1 tablet (6.25 mg total) by mouth 2 (two) times daily. 180 tablet 0  . glucose blood (TRUE METRIX BLOOD GLUCOSE TEST) test strip Check blood sugar 4 times a day before meals and  bedtime 300 each 4  . hydrALAZINE (APRESOLINE) 50 MG tablet Take 1 tablet (50 mg total) by mouth 2 (two) times daily. 180 tablet 0  . Insulin Glargine-Lixisenatide (SOLIQUA) 100-33 UNT-MCG/ML SOPN Inject 25 Units into the skin daily. 3 mL 1  . Multiple Vitamin (MULTIVITAMIN PO) Take 1 tablet by mouth daily at 12 noon.     . Omega-3 Fatty Acids (OMEGA-3 PLUS PO) Take 2 capsules by mouth daily at 12 noon.    . tMarland Kitchenlmisartan-hydrochlorothiazide (MICARDIS HCT) 40-12.5 MG tablet Take 1 tablet by mouth daily. 90 tablet 0  . ticagrelor (BRILINTA) 90 MG TABS tablet Take 1 tablet (90 mg total) by mouth 2 (two) times daily. 180 tablet 1  . vitamin C (ASCORBIC ACID) 250 MG tablet Take 500 mg by mouth daily at 12 noon.     No current facility-administered medications on file prior to visit.    No Known Allergies  No problem-specific Assessment & Plan notes found for this encounter.     Donna [name] PharmD, BCPS, [id number] [place] [id number]/2021 8:22 AM

## 2019-06-27 NOTE — Assessment & Plan Note (Signed)
Patient with history of ASCVD/CVA unable to tolerate statin drugs.  Reviewed options for patient including PCSK-9 inhibitors and bempedoic acid.  Patient agreeable to starting Repatha 140 mg.  Explained mechanism of action, side effects and potential decrease in LDL cholesterol.  Taught injection technique.  Will start paperwork to get approval.  Patient to repeat labs after 3 months.

## 2019-06-30 ENCOUNTER — Telehealth: Payer: Self-pay

## 2019-06-30 MED ORDER — REPATHA SURECLICK 140 MG/ML ~~LOC~~ SOAJ: 140.0000 mg | SUBCUTANEOUS | 11 refills | Status: AC

## 2019-06-30 NOTE — Telephone Encounter (Signed)
Called and lmomed the pt to start repatha, rx sent, pt instructed to call back if issues occur

## 2019-07-14 ENCOUNTER — Other Ambulatory Visit: Payer: Self-pay

## 2019-07-14 MED ORDER — SOLIQUA 100-33 UNT-MCG/ML ~~LOC~~ SOPN: 25.0000 [IU] | PEN_INJECTOR | Freq: Every day | SUBCUTANEOUS | 1 refills | Status: DC

## 2019-07-16 ENCOUNTER — Telehealth: Payer: Self-pay | Admitting: Nurse Practitioner

## 2019-07-16 NOTE — Chronic Care Management (AMB) (Signed)
  Chronic Care Management   Note  07/16/2019 Name: Donna [name] MRN: [id number] DOB: November 03, 1950  Donna [name] is a 69 y.o. year old female who is a primary care patient of Arnette Felts, FNP and is actively engaged with the care management team. I reached out to [employer] by phone today to assist with scheduling an initial visit with the Pharmacist.  Follow up plan: Unsuccessful telephone outreach attempt made. A HIPPA compliant phone message was left for the patient providing contact information and requesting a return call. The care management team will reach out to the patient again over the next 7 days.  If patient returns call to provider office, please advise to call Embedded Care Management Care Guide Gwenevere Ghazi  at [phone].  Gwenevere Ghazi  Care Guide, Embedded Care Coordination [facility]  [place], [place] [address] Direct Dial: [phone] [email or web address] Website: [email or web address]

## 2019-07-17 ENCOUNTER — Other Ambulatory Visit: Payer: Self-pay

## 2019-07-17 ENCOUNTER — Encounter: Payer: Self-pay | Admitting: Nurse Practitioner

## 2019-07-17 ENCOUNTER — Ambulatory Visit (INDEPENDENT_AMBULATORY_CARE_PROVIDER_SITE_OTHER): Payer: Medicare HMO | Admitting: Nurse Practitioner

## 2019-07-17 VITALS — BP 142/80 | HR 70 | Temp 97.4°F | Ht 60.6 in | Wt 161.8 lb

## 2019-07-17 DIAGNOSIS — L84 Corns and callosities: Secondary | ICD-10-CM | POA: Diagnosis not present

## 2019-07-17 DIAGNOSIS — E11319 Type 2 diabetes mellitus with unspecified diabetic retinopathy without macular edema: Secondary | ICD-10-CM | POA: Diagnosis not present

## 2019-07-17 DIAGNOSIS — Z794 Long term (current) use of insulin: Secondary | ICD-10-CM

## 2019-07-17 DIAGNOSIS — I10 Essential (primary) hypertension: Secondary | ICD-10-CM | POA: Diagnosis not present

## 2019-07-17 MED ORDER — XULTOPHY 100-3.6 UNIT-MG/ML ~~LOC~~ SOPN: 25.0000 [IU] | PEN_INJECTOR | Freq: Every day | SUBCUTANEOUS | 2 refills | Status: DC

## 2019-07-17 NOTE — Progress Notes (Signed)
Subjective:     Patient ID: Donna [name] , female    DOB: 02-May-1950 , 69 y.o.   MRN: [id number]   Chief Complaint  Patient presents with  . Diabetes    HPI  She is still awaiting her appt for the arteriogram.    Diabetes She presents for her follow-up diabetic visit. She has type 2 diabetes mellitus. Her disease course has been stable. There are no hypoglycemic associated symptoms. Pertinent negatives for hypoglycemia include no dizziness or headaches. There are no diabetic associated symptoms. Pertinent negatives for diabetes include no chest pain. There are no hypoglycemic complications. Symptoms are stable. There are no diabetic complications. Risk factors for coronary artery disease include sedentary lifestyle, dyslipidemia and diabetes mellitus. Current diabetic treatment includes oral agent (dual therapy). She is compliant with treatment some of the time. (She has not been able to check her blood sugar due to not having any test strips)  Hypertension This is a chronic problem. The current episode started more than 1 year ago. The problem has been gradually improving since onset. Condition status: improving. Pertinent negatives include no anxiety, chest pain, headaches or palpitations. There are no associated agents to hypertension. Risk factors for coronary artery disease include obesity, sedentary lifestyle, diabetes mellitus and dyslipidemia. Past treatments include calcium channel blockers and beta blockers. The current treatment provides moderate improvement.     Past Medical History:  Diagnosis Date  . Anemia    low iron - not since having fibroids removed  . Arthritis   . Diabetes mellitus without complication ([place])   . Hyperlipidemia   . Hypertension   . Sleep apnea    just recently diagnosed with it, has not gotten the Cpap (done 10/29/18)  . Stroke [facility])    weakness on left side     Family History  Problem Relation Age of Onset  . Stroke Mother   . Diabetes  Father   . Diabetes Sister   . Breast cancer Neg Hx      Current Outpatient Medications:  .  amLODipine (NORVASC) 10 MG tablet, Take 1 tablet (10 mg total) by mouth daily., Disp: 90 tablet, Rfl: 0 .  aspirin EC 81 MG tablet, Take 81 mg by mouth every evening., Disp: , Rfl:  .  blood glucose meter kit and supplies KIT, Dispense based on patient and insurance preference. Use up to four times daily as directed. (FOR ICD-9 250.00, 250.01)., Disp: 1 each, Rfl: 0 .  Blood Glucose Monitoring Suppl (TRUE METRIX METER) w/Device KIT, 1 each by Does not apply route in the morning, at noon, in the evening, and at bedtime., Disp: 1 kit, Rfl: 0 .  Calcium Carbonate-Vitamin D (CALCIUM-D PO), Take 2 tablets by mouth daily at 12 noon. , Disp: , Rfl:  .  carvedilol (COREG) 6.25 MG tablet, Take 1 tablet (6.25 mg total) by mouth 2 (two) times daily., Disp: 180 tablet, Rfl: 0 .  Evolocumab (REPATHA SURECLICK) 244 MG/ML SOAJ, Inject 140 mg into the skin every 14 (fourteen) days., Disp: 2 pen, Rfl: 11 .  glucose blood (TRUE METRIX BLOOD GLUCOSE TEST) test strip, Check blood sugar 4 times a day before meals and bedtime, Disp: 300 each, Rfl: 4 .  hydrALAZINE (APRESOLINE) 50 MG tablet, Take 1 tablet (50 mg total) by mouth 2 (two) times daily., Disp: 180 tablet, Rfl: 0 .  Insulin Glargine-Lixisenatide (SOLIQUA) 100-33 UNT-MCG/ML SOPN, Inject 25 Units into the skin daily., Disp: 3 mL, Rfl: 1 .  Multiple Vitamin (  MULTIVITAMIN PO), Take 1 tablet by mouth daily at 12 noon. , Disp: , Rfl:  .  Omega-3 Fatty Acids (OMEGA-3 PLUS PO), Take 2 capsules by mouth daily at 12 noon., Disp: , Rfl:  .  telmisartan-hydrochlorothiazide (MICARDIS HCT) 40-12.5 MG tablet, Take 1 tablet by mouth daily., Disp: 90 tablet, Rfl: 0 .  ticagrelor (BRILINTA) 90 MG TABS tablet, Take 1 tablet (90 mg total) by mouth 2 (two) times daily., Disp: 180 tablet, Rfl: 1 .  vitamin C (ASCORBIC ACID) 250 MG tablet, Take 500 mg by mouth daily at 12 noon., Disp: ,  Rfl:    No Known Allergies   Review of Systems  Constitutional: Negative.   Respiratory: Negative.  Negative for cough.   Cardiovascular: Negative.  Negative for chest pain, palpitations and leg swelling.  Musculoskeletal:       Toe cramping and foot cramping  Neurological: Negative.  Negative for dizziness and headaches.  Psychiatric/Behavioral: Negative.  Negative for agitation.     There were no vitals filed for this visit. There is no height or weight on file to calculate BMI.   Objective:  Physical Exam Constitutional:      General: She is not in acute distress.    Appearance: Normal appearance.  Cardiovascular:     Rate and Rhythm: Normal rate and regular rhythm.     Pulses: Normal pulses.     Heart sounds: Normal heart sounds. No murmur.  Pulmonary:     Effort: Pulmonary effort is normal. No respiratory distress.     Breath sounds: Normal breath sounds.  Skin:    Capillary Refill: Capillary refill takes less than 2 seconds.     Comments: callouses to bilateral feet  Neurological:     General: No focal deficit present.     Mental Status: She is alert and oriented to person, place, and time.     Cranial Nerves: No cranial nerve deficit.  Psychiatric:        Mood and Affect: Mood normal.        Behavior: Behavior normal.        Thought Content: Thought content normal.        Judgment: Judgment normal.         Assessment And Plan:     1. Type 2 diabetes mellitus without complication, with long-term current use of insulin (HCC)  Chronic, will try to order Xultophy since on her preferred list  Will start at 25 units daily if approved  2. Essential hypertension  Her blood pressure is better today  Call made to Dr. Bronson [facility] and left message with his nurse to see what the plans are for her procedure.  She is having her blood pressure managed by cardiology  3. Callus of foot  She has an appt with podiatry next week.  This is  limiting her from walking regularly      [employer], [place]    THE PATIENT IS ENCOURAGED TO PRACTICE SOCIAL DISTANCING DUE TO THE COVID-19 PANDEMIC.

## 2019-07-21 NOTE — Chronic Care Management (AMB) (Signed)
  Care Management   Note  07/21/2019 Name: LAGUANA DESAUTEL MRN: [id number] DOB: 08-Feb-1951  GORDON CARLSON is a 69 y.o. year old female who is a primary care patient of Arnette Felts, FNP and is actively engaged with the care management team. I reached out to [employer] by phone today to assist with scheduling an initial visit with the Pharmacist.  Follow up plan: Telephone appointment with care management team member scheduled for:08/13/2019  [facility] Guide, Embedded Care Coordination [facility]  [place], [place] [address] Direct Dial: [phone] [email or web address] [email or web address] Website: [email or web address]

## 2019-07-23 ENCOUNTER — Telehealth (HOSPITAL_COMMUNITY): Payer: Self-pay | Admitting: Radiology

## 2019-07-23 NOTE — Telephone Encounter (Signed)
Pt's PCP office called Regenia Skeeter, NP). Called Genice back, left detailed message to call back concerning Ms. Hudman. JM

## 2019-07-25 ENCOUNTER — Telehealth: Payer: Medicare HMO

## 2019-07-31 ENCOUNTER — Other Ambulatory Visit: Payer: Self-pay | Admitting: Nurse Practitioner

## 2019-07-31 ENCOUNTER — Telehealth (HOSPITAL_COMMUNITY): Payer: Self-pay | Admitting: Radiology

## 2019-07-31 MED ORDER — TELMISARTAN-HCTZ 40-12.5 MG PO TABS: 1.0000 | ORAL_TABLET | Freq: Every day | ORAL | 0 refills | Status: DC

## 2019-07-31 NOTE — Telephone Encounter (Signed)
Called pt to set up date for stenosis tx with [name]. She will talk to her children/sister and call me back with a date. JM

## 2019-08-01 ENCOUNTER — Other Ambulatory Visit (HOSPITAL_COMMUNITY): Payer: Self-pay | Admitting: Interventional Radiology

## 2019-08-01 DIAGNOSIS — I771 Stricture of artery: Secondary | ICD-10-CM

## 2019-08-05 ENCOUNTER — Ambulatory Visit (HOSPITAL_COMMUNITY): Admission: RE | Admit: 2019-08-05 | Payer: Medicare HMO | Source: Ambulatory Visit

## 2019-08-06 ENCOUNTER — Encounter: Payer: Self-pay | Admitting: Podiatry

## 2019-08-06 ENCOUNTER — Other Ambulatory Visit: Payer: Self-pay

## 2019-08-06 ENCOUNTER — Ambulatory Visit: Payer: Medicare HMO | Admitting: Podiatry

## 2019-08-06 DIAGNOSIS — B351 Tinea unguium: Secondary | ICD-10-CM | POA: Diagnosis not present

## 2019-08-06 DIAGNOSIS — E1159 Type 2 diabetes mellitus with other circulatory complications: Secondary | ICD-10-CM | POA: Diagnosis not present

## 2019-08-06 DIAGNOSIS — E1140 Type 2 diabetes mellitus with diabetic neuropathy, unspecified: Secondary | ICD-10-CM | POA: Insufficient documentation

## 2019-08-06 DIAGNOSIS — E1142 Type 2 diabetes mellitus with diabetic polyneuropathy: Secondary | ICD-10-CM

## 2019-08-06 DIAGNOSIS — M79675 Pain in left toe(s): Secondary | ICD-10-CM | POA: Diagnosis not present

## 2019-08-06 DIAGNOSIS — M79674 Pain in right toe(s): Secondary | ICD-10-CM

## 2019-08-06 NOTE — Progress Notes (Signed)
This patient returns to my office for at risk foot care.  This patient requires this care by a professional since this patient will be at risk due to having peripheral vascular disease, and type 2 diabetes.    This patient is unable to cut nails herself since the patient cannot reach her nails.These nails are painful walking and wearing shoes.  This patient presents for at risk foot care today.  Patient requests diabetic shoes.    General Appearance  Alert, conversant and in no acute stress.  Vascular  Dorsalis pedis and posterior tibial  pulses are weakly  palpable  bilaterally.  Capillary return is within normal limits  bilaterally. Temperature is within normal limits  bilaterally.  Neurologic  Senn-Weinstein monofilament wire test within normal limits  bilaterally. Muscle power within normal limits bilaterally.  Nails Thick disfigured discolored nails with subungual debris  from hallux to fifth toes bilaterally. No evidence of bacterial infection or drainage bilaterally.  Orthopedic  No limitations of motion  feet .  No crepitus or effusions noted.  No bony pathology or digital deformities noted.  Midfoot  DJD  B/L.  Skin  normotropic skin with no porokeratosis noted bilaterally.  No signs of infections or ulcers noted.     Onychomycosis  Pain in right toes  Pain in left toes  Consent was obtained for treatment procedures.   Mechanical debridement of nails 1-5  bilaterally performed with a nail nipper.  Filed with dremel without incident.   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  Patient to make an appointment with the pedorthist.   Return office visit  3 months                   Told patient to return for periodic foot care and evaluation due to potential at risk complications.   Helane Gunther DPM

## 2019-08-13 ENCOUNTER — Telehealth: Payer: Medicare HMO

## 2019-08-13 ENCOUNTER — Other Ambulatory Visit: Payer: Self-pay

## 2019-08-13 ENCOUNTER — Ambulatory Visit (HOSPITAL_COMMUNITY)
Admission: RE | Admit: 2019-08-13 | Discharge: 2019-08-13 | Disposition: A | Discharge status: Home / Self Care | Payer: Medicare HMO | Source: Ambulatory Visit | Attending: Interventional Radiology | Admitting: Interventional Radiology

## 2019-08-13 DIAGNOSIS — I771 Stricture of artery: Secondary | ICD-10-CM

## 2019-08-13 NOTE — Progress Notes (Signed)
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  Referring Physician(s): Melvenia Beam  Supervising Physician: Luanne Bras  Patient Status: [facility] - Out-pt  History of Present Illness: Donna [name] is a 69 y.o. female with a past medical history as below, with pertinent past medical history including hypertension, hyperlipidemia, CVA 06/2018, diabetes mellitus, anemia, obesity, OSA, and arthritis. She is known to [facility] and has been followed by Dr. Estanislado Pandy since 10/2018. She first presented to our department at the request of Dr. Jaynee Eagles for management of intracranial stenosis, thought to be cause of recent CVA at that time. She underwent an image-guided diagnostic cerebral arteriogram 10/18/2018 by Dr. 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 She had tentative plans for revascularization of right MCA stenosis in 11/2018, however patient's procedure was postponed secondary to P2Y12 not being within acceptable limits, and she has not been rescheduled for procedure.  Diagnostic cerebral arteriogram 10/18/2018: 1. Severe high-grade stenosis of approximately 85% of the mid M1 segment of the right middle cerebral artery. 2. Severe high-grade approximately 90% stenosis of left vertebrobasilar junction, and of 60% just distal to the origin of the anterior-inferior cerebellar artery. 3. Approximately 50% stenosis at the origin of the right internal carotid artery at the bulb, and also about 60% of the right internal carotid artery at the cervical petrous junction.  Patient presents today for follow-up to discuss management of right MCA stenosis. Patient awake and alert sitting in chair. Accompanied by sister. Complains of intermittent dizziness when changing positions. Denies headaches, weakness, numbness/tingling, dizziness, vision changes, hearing changes, tinnitus, or speech difficulty.  Currently taking  Brilinta 90 mg twice daily and Aspirin 81 mg once daily.   Past Medical History:  Diagnosis Date  . Anemia    low iron - not since having fibroids removed  . Arthritis   . Diabetes mellitus without complication ([place])   . Hyperlipidemia   . Hypertension   . Sleep apnea    just recently diagnosed with it, has not gotten the Cpap (done 10/29/18)  . Stroke [facility])    weakness on left side    Past Surgical History:  Procedure Laterality Date  . COLONOSCOPY    . IR ANGIO INTRA EXTRACRAN SEL COM CAROTID INNOMINATE BILAT MOD SED  10/18/2018  . IR ANGIO VERTEBRAL SEL VERTEBRAL BILAT MOD SED  10/18/2018  . IR US GUIDE VASC ACCESS RIGHT  10/18/2018  . MYOMECTOMY      Allergies: Patient has no known allergies.  Medications: Prior to Admission medications   Medication Sig Start Date End Date Taking? Authorizing Provider  amLODipine (NORVASC) 10 MG tablet Take 1 tablet (10 mg total) by mouth daily. 05/12/19   Minette Brine, FNP  aspirin EC 81 MG tablet Take 81 mg by mouth every evening.    [provider]  blood glucose meter kit and supplies KIT Dispense based on patient and insurance preference. Use up to four times daily as directed. (FOR ICD-9 250.00, 250.01). 05/10/18   Minette Brine, FNP  Blood Glucose Monitoring Suppl (TRUE METRIX METER) w/Device KIT 1 each by Does not apply route in the morning, at noon, in the evening, and at bedtime. 05/22/19   Minette Brine, FNP  Calcium Carbonate-Vitamin D (CALCIUM-D PO) Take 2 tablets by mouth daily at 12 noon.     [provider]  carvedilol (COREG) 6.25 MG tablet TAKE 1 TABLET(6.25 MG) BY MOUTH TWICE DAILY Patient  taking differently: Take 6.25 mg by mouth 2 (two) times daily with a meal.  07/31/19   Minette Brine, FNP  Evolocumab (REPATHA SURECLICK) 433 MG/ML SOAJ Inject 140 mg into the skin every 14 (fourteen) days. 06/30/19   Elouise Munroe, MD  glucose blood (TRUE METRIX BLOOD GLUCOSE TEST) test strip Check blood sugar 4 times a day  before meals and bedtime 05/22/19   Minette Brine, FNP  hydrALAZINE (APRESOLINE) 50 MG tablet Take 1 tablet (50 mg total) by mouth 2 (two) times daily. 06/16/19 09/14/19  Elouise Munroe, MD  Insulin Degludec-Liraglutide (XULTOPHY) 100-3.6 UNIT-MG/ML SOPN Inject 25 Units into the skin daily. Patient not taking: Reported on 08/06/2019 07/17/19   Minette Brine, FNP  Insulin Glargine-Lixisenatide (SOLIQUA) 100-33 UNT-MCG/ML SOPN Inject 25 Units into the skin daily. Patient taking differently: Inject 30 Units into the skin daily.  07/14/19   Minette Brine, FNP  Multiple Vitamin (MULTIVITAMIN PO) Take 1 tablet by mouth daily at 12 noon.     [provider]  Omega-3 Fatty Acids (OMEGA-3 PLUS PO) Take 2 capsules by mouth daily at 12 noon.    [provider]  telmisartan-hydrochlorothiazide (MICARDIS HCT) 40-12.5 MG tablet Take 1 tablet by mouth daily. 07/31/19   Minette Brine, FNP  ticagrelor (BRILINTA) 90 MG TABS tablet Take 1 tablet (90 mg total) by mouth 2 (two) times daily. 06/12/19   Elouise Munroe, MD  vitamin C (ASCORBIC ACID) 250 MG tablet Take 500 mg by mouth daily at 12 noon.    [provider]     Family History  Problem Relation Age of Onset  . Stroke Mother   . Diabetes Father   . Diabetes Sister   . Breast cancer Neg Hx     Social History   Socioeconomic History  . Marital status: Legally Separated    Spouse name: Not on file  . Number of children: Not on file  . Years of education: Not on file  . Highest education level: Not on file  Occupational History  . Occupation: retired  Tobacco Use  . Smoking status: Never Smoker  . Smokeless tobacco: Never Used  Substance and Sexual Activity  . Alcohol use: No  . Drug use: No  . Sexual activity: Not Currently  Other Topics Concern  . Not on file  Social History Narrative   Lives at home alone   Right handed   Social Determinants of Health   Financial Resource Strain:   . Difficulty of Paying Living  Expenses:   Food Insecurity: No Food Insecurity  . Worried About Charity fundraiser in the Last Year: Never true  . Ran Out of Food in the Last Year: Never true  Transportation Needs: No Transportation Needs  . Lack of Transportation (Medical): No  . Lack of Transportation (Non-Medical): No  Physical Activity:   . Days of Exercise per Week:   . Minutes of Exercise per Session:   Stress:   . Feeling of Stress :   Social Connections:   . Frequency of Communication with Friends and Family:   . Frequency of Social Gatherings with Friends and Family:   . Attends Religious Services:   . Active Member of Clubs or Organizations:   . Attends Archivist Meetings:   Marland Kitchen Marital Status:      Review of Systems: A 12 point ROS discussed and pertinent positives are indicated in the HPI above.  All other systems are negative.  Review of Systems  Constitutional: Negative for chills and fever.  HENT: Negative for hearing loss and tinnitus.   Eyes: Negative for visual disturbance.  Respiratory: Negative for shortness of breath and wheezing.   Cardiovascular: Negative for chest pain and palpitations.  Neurological: Positive for dizziness. Negative for speech difficulty, weakness, numbness and headaches.  Psychiatric/Behavioral: Negative for behavioral problems and confusion.    Vital Signs: There were no vitals taken for this visit.  Physical Exam Constitutional:      General: She is not in acute distress.    Appearance: Normal appearance.  Pulmonary:     Effort: Pulmonary effort is normal. No respiratory distress.  Skin:    General: Skin is warm and dry.  Neurological:     Mental Status: She is alert and oriented to person, place, and time.      Imaging: No results found.  Labs:  CBC: Recent Labs    10/18/18 1034 11/27/18 0728 11/29/18 2043  WBC 5.0 5.5 4.8  HGB 14.1 13.7 13.5  HCT 43.6 41.9 42.2  PLT 254 239 290    COAGS: Recent Labs    10/18/18 1034  11/27/18 0728  INR 1.0 0.9    BMP: Recent Labs    11/27/18 0728 11/29/18 2043 03/31/19 1000 05/22/19 1312  NA 138 136 136 141  K 3.8 3.8 4.3 4.4  CL 102 100 97 99  CO2 23 25 23 29   GLUCOSE 275* 237* 412* 131*  BUN 27* 18 21 18   CALCIUM 9.5 9.8 10.0 10.2  CREATININE 0.91 0.98 1.04* 0.98  GFRNONAA >60 60* 56* 60  GFRAA >60 >60 64 69    LIVER FUNCTION TESTS: Recent Labs    10/07/18 1259 05/22/19 1312  BILITOT 0.3 0.3  AST 17 15  ALT 20 19  ALKPHOS 73 74  PROT 7.3 7.5  ALBUMIN 4.1 4.0     Assessment and Plan:  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 Dr. Estanislado Pandy was present for consultation.  Discussed symptoms. At this time, patient has intermittent dizziness- no other neurological symptoms noted. 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 Due to lack of neurologic symptoms, recommend proceeding with a diagnostic cerebral arteriogram to determine which stenosis is more severe, and proceeding with revascularization of that vessel (if right MCA stenosis is stable, proceed with left VBJ revascularization; if right MCA stenosis has progressed, proceed with right MCA revascularization). Patient conveys understanding and expresses desire to move forward with procedure. Plan for follow-up with an image-guided cerebral arteriogram with intent to treat right MCA stenosis versus left VBJ stenosis, tentatively for Monday 08/18/2019 with Dr. Estanislado Pandy. Informed patient that our schedulers will call her to set up this procedure. Instructed patient to continue taking Brilinta 90 mg twice daily and Aspirin 81 mg once daily.  All questions answered and concerns addressed. Patient conveys understanding and agrees with plan.  Thank you for this interesting consult.  I greatly enjoyed meeting Donna [name] and look forward to  participating in their care.  A copy of this report was sent to the requesting provider on this date.  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

## 2019-08-14 ENCOUNTER — Other Ambulatory Visit (HOSPITAL_COMMUNITY)
Admission: RE | Admit: 2019-08-14 | Discharge: 2019-08-14 | Disposition: A | Discharge status: Home / Self Care | Payer: Medicare HMO | Source: Ambulatory Visit | Attending: Interventional Radiology | Admitting: Interventional Radiology

## 2019-08-14 ENCOUNTER — Other Ambulatory Visit: Payer: Self-pay

## 2019-08-14 ENCOUNTER — Other Ambulatory Visit: Payer: Self-pay | Admitting: Radiology

## 2019-08-14 DIAGNOSIS — Z20822 Contact with and (suspected) exposure to covid-19: Secondary | ICD-10-CM | POA: Diagnosis not present

## 2019-08-14 DIAGNOSIS — Z01812 Encounter for preprocedural laboratory examination: Secondary | ICD-10-CM | POA: Insufficient documentation

## 2019-08-14 LAB — SARS CORONAVIRUS 2 (TAT 6-24 HRS): SARS Coronavirus 2: NEGATIVE

## 2019-08-14 MED ORDER — XULTOPHY 100-3.6 UNIT-MG/ML ~~LOC~~ SOPN: 30.0000 [IU] | PEN_INJECTOR | Freq: Every day | SUBCUTANEOUS | 2 refills | Status: DC

## 2019-08-15 ENCOUNTER — Other Ambulatory Visit: Payer: Self-pay | Admitting: Radiology

## 2019-08-15 ENCOUNTER — Encounter (HOSPITAL_COMMUNITY): Payer: Self-pay | Admitting: Interventional Radiology

## 2019-08-15 NOTE — Progress Notes (Signed)
Ms. Karan takes Xultophy Insulin, I called Diabetic Coordinator to get instructions on how much to take pre- surgery. The Diabetic Coordinator put the call on speaker phone in the office with others coordinators. The team discussed and said if patient takes it in am take full dose , if in PM to take 1/2 dose and to not take it in morning of procedure. Anurse saw that patient is also on [place] and said to do the same with that Insulin, I reports that the DM Medication guide sheet  That was given to staff by the DM Coordinator staff; has to hold [place] the day before and morning of surgery. The group of DM nurses talked about that instruction and said to take half dose the evening before surgery and none the morning of surgery.

## 2019-08-17 ENCOUNTER — Encounter (HOSPITAL_COMMUNITY): Payer: Self-pay | Admitting: Anesthesiology

## 2019-08-18 ENCOUNTER — Encounter (HOSPITAL_COMMUNITY)
Admission: RE | Disposition: A | Discharge status: Home / Self Care | Payer: Self-pay | Source: Home / Self Care | Attending: Interventional Radiology

## 2019-08-18 ENCOUNTER — Ambulatory Visit (HOSPITAL_COMMUNITY)
Admission: RE | Admit: 2019-08-18 | Discharge: 2019-08-18 | Disposition: A | Discharge status: Home / Self Care | Payer: Medicare HMO | Attending: Interventional Radiology | Admitting: Interventional Radiology

## 2019-08-18 ENCOUNTER — Encounter (HOSPITAL_COMMUNITY): Payer: Self-pay | Admitting: Interventional Radiology

## 2019-08-18 ENCOUNTER — Ambulatory Visit (HOSPITAL_COMMUNITY)
Admission: RE | Admit: 2019-08-18 | Discharge: 2019-08-18 | Disposition: A | Discharge status: Home / Self Care | Payer: Medicare HMO | Source: Ambulatory Visit | Attending: Interventional Radiology | Admitting: Interventional Radiology

## 2019-08-18 ENCOUNTER — Other Ambulatory Visit: Payer: Self-pay

## 2019-08-18 DIAGNOSIS — M1990 Unspecified osteoarthritis, unspecified site: Secondary | ICD-10-CM | POA: Diagnosis not present

## 2019-08-18 DIAGNOSIS — Z8673 Personal history of transient ischemic attack (TIA), and cerebral infarction without residual deficits: Secondary | ICD-10-CM | POA: Diagnosis not present

## 2019-08-18 DIAGNOSIS — I6601 Occlusion and stenosis of right middle cerebral artery: Secondary | ICD-10-CM | POA: Diagnosis not present

## 2019-08-18 DIAGNOSIS — I771 Stricture of artery: Secondary | ICD-10-CM

## 2019-08-18 DIAGNOSIS — I6501 Occlusion and stenosis of right vertebral artery: Secondary | ICD-10-CM | POA: Insufficient documentation

## 2019-08-18 DIAGNOSIS — E669 Obesity, unspecified: Secondary | ICD-10-CM | POA: Diagnosis not present

## 2019-08-18 DIAGNOSIS — I651 Occlusion and stenosis of basilar artery: Secondary | ICD-10-CM | POA: Insufficient documentation

## 2019-08-18 DIAGNOSIS — Z794 Long term (current) use of insulin: Secondary | ICD-10-CM | POA: Insufficient documentation

## 2019-08-18 DIAGNOSIS — Z833 Family history of diabetes mellitus: Secondary | ICD-10-CM | POA: Insufficient documentation

## 2019-08-18 DIAGNOSIS — I10 Essential (primary) hypertension: Secondary | ICD-10-CM | POA: Diagnosis not present

## 2019-08-18 DIAGNOSIS — Z823 Family history of stroke: Secondary | ICD-10-CM | POA: Diagnosis not present

## 2019-08-18 DIAGNOSIS — I6503 Occlusion and stenosis of bilateral vertebral arteries: Secondary | ICD-10-CM | POA: Diagnosis not present

## 2019-08-18 DIAGNOSIS — Z79899 Other long term (current) drug therapy: Secondary | ICD-10-CM | POA: Diagnosis not present

## 2019-08-18 DIAGNOSIS — G4733 Obstructive sleep apnea (adult) (pediatric): Secondary | ICD-10-CM | POA: Insufficient documentation

## 2019-08-18 DIAGNOSIS — Z6823 Body mass index (BMI) 23.0-23.9, adult: Secondary | ICD-10-CM | POA: Insufficient documentation

## 2019-08-18 DIAGNOSIS — Z7982 Long term (current) use of aspirin: Secondary | ICD-10-CM | POA: Insufficient documentation

## 2019-08-18 DIAGNOSIS — I6602 Occlusion and stenosis of left middle cerebral artery: Secondary | ICD-10-CM | POA: Diagnosis not present

## 2019-08-18 DIAGNOSIS — I6523 Occlusion and stenosis of bilateral carotid arteries: Secondary | ICD-10-CM | POA: Insufficient documentation

## 2019-08-18 DIAGNOSIS — I6521 Occlusion and stenosis of right carotid artery: Secondary | ICD-10-CM | POA: Diagnosis not present

## 2019-08-18 DIAGNOSIS — E119 Type 2 diabetes mellitus without complications: Secondary | ICD-10-CM | POA: Insufficient documentation

## 2019-08-18 DIAGNOSIS — D649 Anemia, unspecified: Secondary | ICD-10-CM | POA: Insufficient documentation

## 2019-08-18 DIAGNOSIS — E785 Hyperlipidemia, unspecified: Secondary | ICD-10-CM | POA: Diagnosis not present

## 2019-08-18 HISTORY — PX: IR US GUIDE VASC ACCESS RIGHT: IMG2390

## 2019-08-18 HISTORY — PX: RADIOLOGY WITH ANESTHESIA: SHX6223

## 2019-08-18 HISTORY — PX: IR ANGIO INTRA EXTRACRAN SEL COM CAROTID INNOMINATE BILAT MOD SED: IMG5360

## 2019-08-18 HISTORY — PX: IR ANGIO VERTEBRAL SEL SUBCLAVIAN INNOMINATE UNI R MOD SED: IMG5365

## 2019-08-18 HISTORY — DX: Type 2 diabetes mellitus without complications: E11.9

## 2019-08-18 HISTORY — PX: IR ANGIO VERTEBRAL SEL VERTEBRAL UNI L MOD SED: IMG5367

## 2019-08-18 LAB — CBC WITH DIFFERENTIAL/PLATELET
Abs Immature Granulocytes: 0.02 10*3/uL (ref 0.00–0.07)
Basophils Absolute: 0 10*3/uL (ref 0.0–0.1)
Basophils Relative: 1 %
Eosinophils Absolute: 0.1 10*3/uL (ref 0.0–0.5)
Eosinophils Relative: 1 %
HCT: 39.3 % (ref 36.0–46.0)
Hemoglobin: 12.5 g/dL (ref 12.0–15.0)
Immature Granulocytes: 0 %
Lymphocytes Relative: 31 %
Lymphs Abs: 1.5 10*3/uL (ref 0.7–4.0)
MCH: 28.3 pg (ref 26.0–34.0)
MCHC: 31.8 g/dL (ref 30.0–36.0)
MCV: 88.9 fL (ref 80.0–100.0)
Monocytes Absolute: 0.5 10*3/uL (ref 0.1–1.0)
Monocytes Relative: 10 %
Neutro Abs: 2.8 10*3/uL (ref 1.7–7.7)
Neutrophils Relative %: 57 %
Platelets: 249 10*3/uL (ref 150–400)
RBC: 4.42 MIL/uL (ref 3.87–5.11)
RDW: 12.6 % (ref 11.5–15.5)
WBC: 5 10*3/uL (ref 4.0–10.5)
nRBC: 0 % (ref 0.0–0.2)

## 2019-08-18 LAB — GLUCOSE, CAPILLARY: Glucose-Capillary: 201 mg/dL — ABNORMAL HIGH (ref 70–99)

## 2019-08-18 LAB — BASIC METABOLIC PANEL
Anion gap: 10 (ref 5–15)
BUN: 26 mg/dL — ABNORMAL HIGH (ref 8–23)
CO2: 25 mmol/L (ref 22–32)
Calcium: 9.7 mg/dL (ref 8.9–10.3)
Chloride: 101 mmol/L (ref 98–111)
Creatinine, Ser: 0.93 mg/dL (ref 0.44–1.00)
GFR calc Af Amer: >60 mL/min (ref >60)
GFR calc non Af Amer: >60 mL/min (ref >60)
Glucose, Bld: 211 mg/dL — ABNORMAL HIGH (ref 70–99)
Potassium: 3.6 mmol/L (ref 3.5–5.1)
Sodium: 136 mmol/L (ref 135–145)

## 2019-08-18 LAB — PROTIME-INR
INR: 0.9 (ref 0.8–1.2)
Prothrombin Time: 11.6 seconds (ref 11.4–15.2)

## 2019-08-18 SURGERY — MRI WITH ANESTHESIA
Anesthesia: General

## 2019-08-18 MED ORDER — IOHEXOL 300 MG/ML  SOLN: 150.0000 mL | Freq: Once | INTRAMUSCULAR | Status: DC | PRN

## 2019-08-18 MED ORDER — SODIUM CHLORIDE 0.9 % IV SOLN: INTRAVENOUS | Status: DC

## 2019-08-18 MED ORDER — NITROGLYCERIN 1 MG/10 ML FOR IR/CATH LAB
INTRA_ARTERIAL | Status: AC | PRN
  Administered 2019-08-18: 200 ug via INTRA_ARTERIAL

## 2019-08-18 MED ORDER — CEFAZOLIN SODIUM-DEXTROSE 2-4 GM/100ML-% IV SOLN
2.0000 g | INTRAVENOUS | Status: DC
  Filled 2019-08-18: qty 100

## 2019-08-18 MED ORDER — HEPARIN SODIUM (PORCINE) 1000 UNIT/ML IJ SOLN
INTRAMUSCULAR | Status: AC
  Filled 2019-08-18: qty 1

## 2019-08-18 MED ORDER — VERAPAMIL HCL 2.5 MG/ML IV SOLN
INTRAVENOUS | Status: AC
  Filled 2019-08-18: qty 2

## 2019-08-18 MED ORDER — MIDAZOLAM HCL 2 MG/2ML IJ SOLN
INTRAMUSCULAR | Status: AC
  Filled 2019-08-18: qty 2

## 2019-08-18 MED ORDER — ASPIRIN EC 325 MG PO TBEC
325.0000 mg | DELAYED_RELEASE_TABLET | ORAL | Status: DC
  Filled 2019-08-18: qty 1

## 2019-08-18 MED ORDER — FENTANYL CITRATE (PF) 100 MCG/2ML IJ SOLN
INTRAMUSCULAR | Status: AC | PRN
  Administered 2019-08-18: 25 ug via INTRAVENOUS

## 2019-08-18 MED ORDER — VERAPAMIL HCL 2.5 MG/ML IV SOLN
INTRAVENOUS | Status: AC | PRN
  Administered 2019-08-18: 2.5 mg via INTRAVENOUS

## 2019-08-18 MED ORDER — IOHEXOL 300 MG/ML  SOLN
150.0000 mL | Freq: Once | INTRAMUSCULAR | Status: AC | PRN
  Administered 2019-08-18: 75 mL via INTRA_ARTERIAL

## 2019-08-18 MED ORDER — NIMODIPINE 30 MG PO CAPS
0.0000 mg | ORAL_CAPSULE | ORAL | Status: DC
  Filled 2019-08-18: qty 1

## 2019-08-18 MED ORDER — NITROGLYCERIN 1 MG/10 ML FOR IR/CATH LAB
INTRA_ARTERIAL | Status: AC
  Filled 2019-08-18: qty 10

## 2019-08-18 MED ORDER — LIDOCAINE HCL 1 % IJ SOLN
INTRAMUSCULAR | Status: AC | PRN
  Administered 2019-08-18: 2 mL

## 2019-08-18 MED ORDER — SODIUM CHLORIDE 0.9 % IV SOLN: INTRAVENOUS | Status: AC

## 2019-08-18 MED ORDER — HEPARIN SODIUM (PORCINE) 1000 UNIT/ML IJ SOLN
INTRAMUSCULAR | Status: AC | PRN
  Administered 2019-08-18: 2000 [IU] via INTRAVENOUS

## 2019-08-18 MED ORDER — FENTANYL CITRATE (PF) 100 MCG/2ML IJ SOLN
INTRAMUSCULAR | Status: AC
  Filled 2019-08-18: qty 2

## 2019-08-18 MED ORDER — MIDAZOLAM HCL 2 MG/2ML IJ SOLN
INTRAMUSCULAR | Status: AC | PRN
  Administered 2019-08-18: 1 mg via INTRAVENOUS

## 2019-08-18 MED ORDER — LIDOCAINE HCL 1 % IJ SOLN
INTRAMUSCULAR | Status: AC
  Filled 2019-08-18: qty 20

## 2019-08-18 NOTE — Procedures (Signed)
S/P 4 vessel cerebral arterriogram RT rad approach. Findings. 1 Appr ox 80 % stenosis distal RT MCA M1 ? Dominant inf division. 2.Severe stenosis RT ICA cervical petrous junction. 3.Severe stenosis LT VBJ. 4.Approx 70 % stenosis Lt ICA prox. S.Torrey Ballinas MD

## 2019-08-18 NOTE — H&P (Signed)
Chief Complaint: Patient was seen in consultation today for cerebral arteriogram at the request of Dr Myrla Halsted  Supervising Physician: Luanne Bras  Patient Status: [facility] - Out-pt  History of Present Illness: Donna [name] is a 69 y.o. female   Hypertension, hyperlipidemia, CVA 06/2018, diabetes mellitus, anemia, obesity, OSA, and arthritis. She is known to [facility] and has been followed by Dr. Estanislado Pandy since 10/2018. She first presented to our department at the request of Dr. Jaynee Eagles for management of intracranial stenosis, thought to be cause of recent CVA at that time. She underwent an image-guided diagnostic cerebral arteriogram 10/18/2018 by Dr. 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 She had tentative plans for revascularization of right MCA stenosis in 11/2018, however patient's procedure was postponed secondary to P2Y12 not being within acceptable limits, and she has not been rescheduled for procedure.  Cerebral arteriogram 8/20 revealed: Severe high-grade stenosis of approximately 85% of the mid M1 segment of the right middle cerebral artery. Severe high-grade approximately 90% stenosis of left vertebrobasilar junction, and of 60% just distal to the origin of the anterior-inferior cerebellar artery.  Approximately 50% stenosis at the origin of the right internal carotid artery at the bulb, and also about 60% of the right internal carotid artery at the cervical petrous junction.  Pt taking Brilinta 90 BID and ASA 81 mg daily  She is suffering from occasional dizziness-- denies N/V Denies vision or speech changes Denies gait instability  Was seen in consultation with Dr Estanislado Pandy 08/13/19 Rec: cerebral arteriogram to determine severity of stenosis: R MCA vs L VBJ Was planned for treatment today of angioplasty/stent But P2y12 tubes are on National backorder Cannot perform this test today  Discussed with Dr Estanislado Pandy and pt Plan:  Will move  ahead with diagnostic cerebral arteriogram today to determine the severity of stenosis in these vessels. Will reschedule intervention when able to evaluate P2y12 and safely move ahead.  Past Medical History:  Diagnosis Date  . Anemia    low iron - not since having fibroids removed  . Arthritis   . Diabetes mellitus without complication ([place])    type   . Hyperlipidemia   . Hypertension   . Sleep apnea    just recently diagnosed with it, has not gotten the Cpap (done 10/29/18)  . Stroke [facility])    weakness on left side  . Type II diabetes mellitus ([place])     Past Surgical History:  Procedure Laterality Date  . COLONOSCOPY    . IR ANGIO INTRA EXTRACRAN SEL COM CAROTID INNOMINATE BILAT MOD SED  10/18/2018  . IR ANGIO VERTEBRAL SEL VERTEBRAL BILAT MOD SED  10/18/2018  . IR US GUIDE VASC ACCESS RIGHT  10/18/2018  . MYOMECTOMY      Allergies: Patient has no known allergies.  Medications: Prior to Admission medications   Medication Sig Start Date End Date Taking? Authorizing Provider  amLODipine (NORVASC) 10 MG tablet Take 1 tablet (10 mg total) by mouth daily. 05/12/19  Yes Minette Brine, FNP  aspirin EC 81 MG tablet Take 81 mg by mouth every evening.   Yes [provider]  Calcium Carbonate-Vitamin D (CALCIUM-D PO) Take 2 tablets by mouth daily at 12 noon.    Yes [provider]  carvedilol (COREG) 6.25 MG tablet TAKE 1 TABLET(6.25 MG) BY MOUTH TWICE DAILY Patient taking differently: Take 6.25 mg by mouth 2 (two) times daily with a meal.  07/31/19  Yes Minette Brine, FNP  hydrALAZINE (  APRESOLINE) 50 MG tablet Take 1 tablet (50 mg total) by mouth 2 (two) times daily. 06/16/19 09/14/19 Yes Elouise Munroe, MD  Insulin Glargine-Lixisenatide (SOLIQUA) 100-33 UNT-MCG/ML SOPN Inject 25 Units into the skin daily. Patient taking differently: Inject 30 Units into the skin daily.  07/14/19  Yes Minette Brine, FNP  Multiple Vitamin (MULTIVITAMIN PO) Take 1 tablet by mouth daily at 12 noon.     Yes [provider]  Omega-3 Fatty Acids (OMEGA-3 PLUS PO) Take 2 capsules by mouth daily at 12 noon.   Yes [provider]  telmisartan-hydrochlorothiazide (MICARDIS HCT) 40-12.5 MG tablet Take 1 tablet by mouth daily. 07/31/19  Yes Minette Brine, FNP  ticagrelor (BRILINTA) 90 MG TABS tablet Take 1 tablet (90 mg total) by mouth 2 (two) times daily. 06/12/19  Yes Elouise Munroe, MD  vitamin C (ASCORBIC ACID) 250 MG tablet Take 500 mg by mouth daily at 12 noon.   Yes [provider]  blood glucose meter kit and supplies KIT Dispense based on patient and insurance preference. Use up to four times daily as directed. (FOR ICD-9 250.00, 250.01). 05/10/18   Minette Brine, FNP  Blood Glucose Monitoring Suppl (TRUE METRIX METER) w/Device KIT 1 each by Does not apply route in the morning, at noon, in the evening, and at bedtime. 05/22/19   Minette Brine, FNP  Evolocumab (REPATHA SURECLICK) 357 MG/ML SOAJ Inject 140 mg into the skin every 14 (fourteen) days. 06/30/19   Elouise Munroe, MD  glucose blood (TRUE METRIX BLOOD GLUCOSE TEST) test strip Check blood sugar 4 times a day before meals and bedtime 05/22/19   Minette Brine, FNP  Insulin Degludec-Liraglutide (XULTOPHY) 100-3.6 UNIT-MG/ML SOPN Inject 30 Units into the skin daily. 08/14/19   Minette Brine, FNP     Family History  Problem Relation Age of Onset  . Stroke Mother   . Diabetes Father   . Diabetes Sister   . Breast cancer Neg Hx     Social History   Socioeconomic History  . Marital status: Legally Separated    Spouse name: Not on file  . Number of children: Not on file  . Years of education: Not on file  . Highest education level: Not on file  Occupational History  . Occupation: retired  Tobacco Use  . Smoking status: Never Smoker  . Smokeless tobacco: Never Used  Substance and Sexual Activity  . Alcohol use: No  . Drug use: No  . Sexual activity: Not Currently  Other Topics Concern  . Not on file    Social History Narrative   Lives at home alone   Right handed   Social Determinants of Health   Financial Resource Strain:   . Difficulty of Paying Living Expenses:   Food Insecurity: No Food Insecurity  . Worried About Charity fundraiser in the Last Year: Never true  . Ran Out of Food in the Last Year: Never true  Transportation Needs: No Transportation Needs  . Lack of Transportation (Medical): No  . Lack of Transportation (Non-Medical): No  Physical Activity:   . Days of Exercise per Week:   . Minutes of Exercise per Session:   Stress:   . Feeling of Stress :   Social Connections:   . Frequency of Communication with Friends and Family:   . Frequency of Social Gatherings with Friends and Family:   . Attends Religious Services:   . Active Member of Clubs or Organizations:   . Attends Club or  Organization Meetings:   Marland Kitchen Marital Status:     Review of Systems: A 12 point ROS discussed and pertinent positives are indicated in the HPI above.  All other systems are negative.  Review of Systems  Constitutional: Negative for activity change, fatigue and fever.  HENT: Negative for hearing loss, tinnitus, trouble swallowing and voice change.   Eyes: Negative for visual disturbance.  Respiratory: Negative for shortness of breath.   Cardiovascular: Negative for chest pain.  Gastrointestinal: Negative for abdominal pain, nausea and vomiting.  Musculoskeletal: Negative for gait problem.  Neurological: Negative for dizziness, tremors, seizures, syncope, facial asymmetry, speech difficulty, weakness, light-headedness, numbness and headaches.  Psychiatric/Behavioral: Negative for behavioral problems and confusion.    Vital Signs: BP (!) 158/56   Pulse 76   Temp 97.7 F (36.5 C) (Oral)   Resp 17   Ht 5' 1"  (1.549 m)   Wt 151 lb (68.5 kg)   SpO2 100%   BMI 28.53 kg/m   Physical Exam Vitals reviewed.  Constitutional:      Appearance: Normal appearance.     Comments: Face  symmetrical Tongue midline  HENT:     Head: Atraumatic.     Mouth/Throat:     Mouth: Mucous membranes are moist.  Eyes:     Extraocular Movements: Extraocular movements intact.  Cardiovascular:     Rate and Rhythm: Normal rate and regular rhythm.     Heart sounds: Normal heart sounds.  Pulmonary:     Effort: Pulmonary effort is normal.     Breath sounds: Normal breath sounds.  Abdominal:     Palpations: Abdomen is soft.  Musculoskeletal:        General: Normal range of motion.     Cervical back: Normal range of motion.     Comments: Moves all 4s equally = strength = sensation  Skin:    General: Skin is warm and dry.  Neurological:     Mental Status: She is alert and oriented to person, place, and time.  Psychiatric:        Mood and Affect: Mood normal.        Behavior: Behavior normal.        Thought Content: Thought content normal.        Judgment: Judgment normal.     Imaging: No results found.  Labs:  CBC: Recent Labs    10/18/18 1034 11/27/18 0728 11/29/18 2043 08/18/19 0705  WBC 5.0 5.5 4.8 5.0  HGB 14.1 13.7 13.5 12.5  HCT 43.6 41.9 42.2 39.3  PLT 254 239 290 249    COAGS: Recent Labs    10/18/18 1034 11/27/18 0728  INR 1.0 0.9    BMP: Recent Labs    11/27/18 0728 11/29/18 2043 03/31/19 1000 05/22/19 1312  NA 138 136 136 141  K 3.8 3.8 4.3 4.4  CL 102 100 97 99  CO2 23 25 23 29   GLUCOSE 275* 237* 412* 131*  BUN 27* 18 21 18   CALCIUM 9.5 9.8 10.0 10.2  CREATININE 0.91 0.98 1.04* 0.98  GFRNONAA >60 60* 56* 60  GFRAA >60 >60 64 69    LIVER FUNCTION TESTS: Recent Labs    10/07/18 1259 05/22/19 1312  BILITOT 0.3 0.3  AST 17 15  ALT 20 19  ALKPHOS 73 74  PROT 7.3 7.5  ALBUMIN 4.1 4.0    TUMOR MARKERS: No results for input(s): AFPTM, CEA, CA199, CHROMGRNA in the last 8760 hours.  Assessment and Plan:  Known Right middle cerebral  artery stenosis and Left vertebro basilar artery stenosis Scheduled for cerebral arteriogram  today Was scheduled for possible intervention today- but- P2y12 tubes still on national backorder. Plan today is for diagnostic arteriogram only-- will reschedule for intervention when able to evaluate P2y12. Pt is aware and agreeable  Risks and benefits of cerebral angiogram with intervention were discussed with the patient including, but not limited to bleeding, infection, vascular injury, contrast induced renal failure, stroke or even death.  This interventional procedure involves the use of X-rays and because of the nature of the planned procedure, it is possible that we will have prolonged use of X-ray fluoroscopy.  Potential radiation risks to you include (but are not limited to) the following: - A slightly elevated risk for cancer  several years later in life. This risk is typically less than 0.5% percent. This risk is low in comparison to the normal incidence of human cancer, which is 33% for women and 50% for men according to the [place]. - Radiation induced injury can include skin redness, resembling a rash, tissue breakdown / ulcers and hair loss (which can be temporary or permanent).   The likelihood of either of these occurring depends on the difficulty of the procedure and whether you are sensitive to radiation due to previous procedures, disease, or genetic conditions.   IF your procedure requires a prolonged use of radiation, you will be notified and given written instructions for further action.  It is your responsibility to monitor the irradiated area for the 2 weeks following the procedure and to notify your physician if you are concerned that you have suffered a radiation induced injury.    All of the patient's questions were answered, patient is agreeable to proceed.  Consent signed and in chart.  Thank you for this interesting consult.  I greatly enjoyed meeting [employer] and look forward to participating in their care.  A copy of this report was  sent to the requesting provider on this date.  Electronically Signed: Lavonia Drafts, PA-C 08/18/2019, 7:49 AM   I spent a total of    25 Minutes in face to face in clinical consultation, greater than 50% of which was counseling/coordinating care for cerebral arteriogram

## 2019-08-18 NOTE — Progress Notes (Signed)
Beckey Downing, PA notified that P2Y12 tubes are still not available and on national backorder

## 2019-08-18 NOTE — Discharge Instructions (Signed)
Radial Site Care  This sheet gives you information about how to care for yourself after your procedure. Your health care provider may also give you more specific instructions. If you have problems or questions, contact your health care provider. What can I expect after the procedure? After the procedure, it is common to have:  Bruising and tenderness at the catheter insertion area. Follow these instructions at home: Medicines  Take over-the-counter and prescription medicines only as told by your health care provider. Insertion site care  Follow instructions from your health care provider about how to take care of your insertion site. Make sure you: ? Wash your hands with soap and water before you change your bandage (dressing). If soap and water are not available, use hand sanitizer. ? Change your dressing as told by your health care provider. ? Leave stitches (sutures), skin glue, or adhesive strips in place. These skin closures may need to stay in place for 2 weeks or longer. If adhesive strip edges start to loosen and curl up, you may trim the loose edges. Do not remove adhesive strips completely unless your health care provider tells you to do that.  Check your insertion site every day for signs of infection. Check for: ? Redness, swelling, or pain. ? Fluid or blood. ? Pus or a bad smell. ? Warmth.  Do not take baths, swim, or use a hot tub until your health care provider approves.  You may shower 24-48 hours after the procedure, or as directed by your health care provider. ? Remove the dressing and gently wash the site with plain soap and water. ? Pat the area dry with a clean towel. ? Do not rub the site. That could cause bleeding.  Do not apply powder or lotion to the site. Activity   For 24 hours after the procedure, or as directed by your health care provider: ? Do not flex or bend the affected arm. ? Do not push or pull heavy objects with the affected arm. ? Do not  drive yourself home from the hospital or clinic. You may drive 24 hours after the procedure unless your health care provider tells you not to. ? Do not operate machinery or power tools.  Do not lift anything that is heavier than 10 lb (4.5 kg), or the limit that you are told, until your health care provider says that it is safe.  Ask your health care provider when it is okay to: ? Return to work or school. ? Resume usual physical activities or sports. ? Resume sexual activity. General instructions  If the catheter site starts to bleed, raise your arm and put firm pressure on the site. If the bleeding does not stop, get help right away. This is a medical emergency.  If you went home on the same day as your procedure, a responsible adult should be with you for the first 24 hours after you arrive home.  Keep all follow-up visits as told by your health care provider. This is important. Contact a health care provider if:  You have a fever.  You have redness, swelling, or yellow drainage around your insertion site. Get help right away if:  You have unusual pain at the radial site.  The catheter insertion area swells very fast.  The insertion area is bleeding, and the bleeding does not stop when you hold steady pressure on the area.  Your arm or hand becomes pale, cool, tingly, or numb. These symptoms may represent a serious problem   that is an emergency. Do not wait to see if the symptoms will go away. Get medical help right away. Call your local emergency services (911 in the [place] Do not drive yourself to the hospital. Summary  After the procedure, it is common to have bruising and tenderness at the site.  Follow instructions from your health care provider about how to take care of your radial site wound. Check the wound every day for signs of infection.  Do not lift anything that is heavier than 10 lb (4.5 kg), or the limit that you are told, until your health care provider says  that it is safe. This information is not intended to replace advice given to you by your health care provider. Make sure you discuss any questions you have with your health care provider. Document Revised: 04/04/2017 Document Reviewed: 04/04/2017 Elsevier Patient Education  2020 [employer]  

## 2019-08-19 ENCOUNTER — Encounter: Payer: Self-pay | Admitting: *Deleted

## 2019-08-20 ENCOUNTER — Other Ambulatory Visit (HOSPITAL_COMMUNITY): Payer: Self-pay | Admitting: Interventional Radiology

## 2019-08-20 DIAGNOSIS — I771 Stricture of artery: Secondary | ICD-10-CM

## 2019-08-21 ENCOUNTER — Other Ambulatory Visit: Payer: Medicare HMO | Admitting: Orthotics

## 2019-08-21 ENCOUNTER — Other Ambulatory Visit (HOSPITAL_COMMUNITY)
Admission: RE | Admit: 2019-08-21 | Discharge: 2019-08-21 | Disposition: A | Discharge status: Home / Self Care | Payer: Medicare HMO | Source: Ambulatory Visit | Attending: Interventional Radiology | Admitting: Interventional Radiology

## 2019-08-21 ENCOUNTER — Other Ambulatory Visit: Payer: Self-pay | Admitting: Radiology

## 2019-08-21 DIAGNOSIS — Z01812 Encounter for preprocedural laboratory examination: Secondary | ICD-10-CM | POA: Insufficient documentation

## 2019-08-21 DIAGNOSIS — Z20822 Contact with and (suspected) exposure to covid-19: Secondary | ICD-10-CM | POA: Insufficient documentation

## 2019-08-21 LAB — SARS CORONAVIRUS 2 (TAT 6-24 HRS): SARS Coronavirus 2: NEGATIVE

## 2019-08-22 NOTE — Progress Notes (Signed)
Spoke with Karie Mainland, [place] for Dr. Corliss Skains.  Karie Mainland and Dr. Corliss Skains are aware that P2Y12 tubes are not available at this time.  Per Dr. Corliss Skains, we will proceed with the procedure.

## 2019-08-22 NOTE — Anesthesia Preprocedure Evaluation (Addendum)
Anesthesia Evaluation  Patient identified by MRN, date of birth, ID band Patient awake    Reviewed: Allergy & Precautions, NPO status , Patient's Chart, lab work & pertinent test results, reviewed documented beta blocker date and time   Airway Mallampati: II  TM Distance: >3 FB Neck ROM: Full    Dental no notable dental hx. (+) Dental Advisory Given, Chipped,    Pulmonary sleep apnea ,    Pulmonary exam normal breath sounds clear to auscultation       Cardiovascular hypertension, Pt. on home beta blockers and Pt. on medications + Peripheral Vascular Disease  Normal cardiovascular exam Rhythm:Regular Rate:Normal  EKG: EKG 03/04/2019 ([facility]): Normal sinus rhythm T wave abnormality, consider lateral ischemia Abnormal ECG  Nuclear stress test 05/20/19: The left ventricular ejection fraction is hyperdynamic (>65%). Nuclear stress EF: 72%. There was no ST segment deviation noted during stress. The study is normal. This is a low risk study.   Echo 10/08/18: IMPRESSIONS 1. The left ventricle has hyperdynamic systolic function, with an ejection fraction of >65%. The cavity size was normal. There is moderate asymmetric left ventricular hypertrophy with at least mild concentric hypertrophy. Left ventricular diastolic Doppler parameters are consistent with impaired relaxation. Elevated left ventricular end-diastolic pressure The E/e' is 4. 2. The right ventricle has normal systolic function. The cavity was normal. There is no increase in right ventricular wall thickness. 3. The mitral valve is abnormal. There is moderate to severe bulky mitral annular calcification present. 4. The aortic valve has an indeterminate number of cusps. Aortic valve regurgitation is trivial by color flow Doppler. 5. The aortic root is normal in size and structure. 6. No intracardiac thrombi or masses were visualized.  Carotid US  10/03/18: Summary: -Right Carotid: Velocities in the right ICA are consistent with a 1-39% stenosis.The RICA velocities remain within normal range and stable compared to the prior exam. -Left Carotid: Velocities in the left ICA are now consistent with a 40-59% stenosis, based on peak systolic velocities and plaque formation. The ECA appears >50% stenosed. The LICA velocities are elevatedand essentially stable compared to the prior exam. The LECA velocities have increased compared to the prior exam. -Vertebrals: Bilateral vertebral arteries demonstrate antegrade flow. Small caliber right vertebral artery. -Subclavians: Normal flow hemodynamics were seen in bilateral subclavian arteries.    Neuro/Psych CVA (left sided weakness), Residual Symptoms negative psych ROS   GI/Hepatic negative GI ROS, Neg liver ROS,   Endo/Other  diabetes (A1C 11.2), Poorly Controlled, Insulin Dependent  Renal/GU negative Renal ROS  negative genitourinary   Musculoskeletal  (+) Arthritis ,   Abdominal   Peds  Hematology  (+) Blood dyscrasia (on brillinta), ,   Anesthesia Other Findings Patient with CVA 06/2018. 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   Reproductive/Obstetrics                           Anesthesia Physical Anesthesia Plan  ASA: III  Anesthesia Plan: MAC   Post-op Pain Management:    Induction: Intravenous  PONV Risk Score and Plan: 2 and Midazolam, Ondansetron and Propofol infusion  Airway Management Planned: Natural Airway and Simple Face Mask  Additional Equipment: Arterial line  Intra-op Plan:   Post-operative Plan:   Informed Consent: I have reviewed the patients History and Physical, chart, labs and discussed the procedure including the risks, benefits and alternatives for the proposed anesthesia with the patient or authorized representative who has indicated  his/her understanding and acceptance.      Dental advisory given  Plan Discussed with: CRNA  Anesthesia Plan Comments: ( )      Anesthesia Quick Evaluation

## 2019-08-22 NOTE — Progress Notes (Signed)
Anesthesia Chart Review:  Case: [id number] Date/Time: 08/25/19 0815   Procedure: STENTING (N/A )   Anesthesia type: General   Pre-op diagnosis: STENOSIS   Location: [facility]   Surgeons: Luanne Bras, MD      DISCUSSION: Patient is a 69 year old female scheduled for the above procedure. Patient with CVA 06/2018. 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 She was scheduled for cerebral arteriogram with possible right middle cerebral artery angioplasty/stent placement on 11/27/2018 by Luanne Bras, MD, but case canceled due to abnormal p2y12. She is now on Brilinta (instead of Plavix) 90 mg BID and ASA 81 mg daily.  Dr. Estanislado Pandy aware that p2y12 tubes are not available currently.   History includes never smoker, hypertension, diabetes mellitus type 2, hyperlipidemia, anemia (improved after myomectomy; TAH 07/13/09), OSA (diagnosed 10/29/18), CVA (06/2018; left hemiparesis). A1c on 05/22/19 was 11.2%, down from 12.3% when this procedure was scheduled last year.   Probable acute CVA noted on head CT after 07/09/18. She has since seen neurology, PCP, and IR. Cardiologist Buford Dresser, MD also reviewed recent echo and carotid studies and did not feel there was a clear cardiac reason for CVA. She went on to have a low risk stress test on 05/20/19 and cardiology follow-up with Dr. Margaretann Loveless to optimize BP control.  08/21/2019 preprocedure COVID-19 test negative.  VS:   Wt Readings from Last 3 Encounters:  08/18/19 68.5 kg  07/17/19 73.4 kg  06/26/19 72.6 kg   BP Readings from Last 3 Encounters:  08/18/19 (!) 120/56  08/18/19 (!) 150/70  08/06/19 (!) 169/83   Pulse Readings from Last 3 Encounters:  08/18/19 74  08/18/19 88  08/06/19 77    PROVIDERS: Minette Brine, FNP is PCP Sarina Ill, MD is neurologist Cherlynn Kaiser, MD is cardiologist. Last evaluation 05/30/19. At that time BP 169/80 and felt suboptimal for right MCA  stenting, and hydralazine increased. (BP 120/56 on 08/18/19) Deitra Mayo, MD is vascular surgeon   LABS: Labs as of 08/18/19 (08/18/19 results were ordered by IR) include: Lab Results  Component Value Date   WBC 5.0 08/18/2019   HGB 12.5 08/18/2019   HCT 39.3 08/18/2019   PLT 249 08/18/2019   GLUCOSE 211 (H) 08/18/2019   CHOL 217 (H) 05/22/2019   TRIG 114 05/22/2019   HDL 65 05/22/2019   LDLCALC 132 (H) 05/22/2019   ALT 19 05/22/2019   AST 15 05/22/2019   NA 136 08/18/2019   K 3.6 08/18/2019   CL 101 08/18/2019   CREATININE 0.93 08/18/2019   BUN 26 (H) 08/18/2019   CO2 25 08/18/2019   TSH 0.568 04/04/2018   INR 0.9 08/18/2019   HGBA1C 11.2 (H) 05/22/2019   MICROALBUR 80 01/08/2019     IMAGES: Cerebral angiography 10/18/18: IMPRESSION: - Approximately 75-80% stenosis of the mid M1 segment of the of the right middle cerebral artery with moderate stenosis of the proximal inferior division. - Approximately 80% stenosis at the junction of the cervical and petrous right ICA. - Approximately 50% stenosis at the origin of the right vertebral artery, and 50% at the right vertebrobasilar junction. - Approximately 90% stenosis of the left vertebrobasilar junction distal to the origin of the left posterior-inferior cerebellar artery. - Approximately 75% narrowing of the origin of the left external carotid artery. - Suspicion of high-grade possibly 75% narrowing at left internal carotid artery bulb. - Moderate size ulceration of the right internal carotid artery at the bulb.  EKG:  EKG 03/04/2019 ([facility]): Normal sinus rhythm T wave abnormality, consider lateral ischemia Abnormal ECG   CV: Nuclear stress test 05/20/19:  The left ventricular ejection fraction is hyperdynamic (>65%).  Nuclear stress EF: 72%.  There was no ST segment deviation noted during stress.  The study is normal.  This is a low risk study.    Echo 10/08/18: IMPRESSIONS 1. The  left ventricle has hyperdynamic systolic function, with an ejection fraction of >65%. The cavity size was normal. There is moderate asymmetric left ventricular hypertrophy with at least mild concentric hypertrophy. Left ventricular diastolic  Doppler parameters are consistent with impaired relaxation. Elevated left ventricular end-diastolic pressure The E/e' is 50. 2. The right ventricle has normal systolic function. The cavity was normal. There is no increase in right ventricular wall thickness. 3. The mitral valve is abnormal. There is moderate to severe bulky mitral annular calcification present. 4. The aortic valve has an indeterminate number of cusps. Aortic valve regurgitation is trivial by color flow Doppler. 5. The aortic root is normal in size and structure. 6. No intracardiac thrombi or masses were visualized.   Carotid US 10/03/18: Summary: - Right Carotid: Velocities in the right ICA are consistent with a 1-39% stenosis. The RICA velocities remain within normal range and stable compared to the prior exam. - Left Carotid: Velocities in the left ICA are now consistent with a 40-59% stenosis, based on peak systolic velocities and plaque formation. The ECA appears >50% stenosed. The LICA velocities are elevated and essentially stable compared to the prior exam. The LECA velocities have increased compared to the prior exam. - Vertebrals: Bilateral vertebral arteries demonstrate antegrade flow. Small caliber right vertebral artery. - Subclavians: Normal flow hemodynamics were seen in bilateral subclavian arteries.    Past Medical History:  Diagnosis Date  . Anemia    low iron - not since having fibroids removed  . Arthritis   . Diabetes mellitus without complication ([place])    type   . Hyperlipidemia   . Hypertension   . Sleep apnea    just recently diagnosed  with it, has not gotten the Cpap (done 10/29/18)  . Stroke [facility])    weakness on left side  . Type II diabetes mellitus ([place])     Past Surgical History:  Procedure Laterality Date  . COLONOSCOPY    . IR ANGIO INTRA EXTRACRAN SEL COM CAROTID INNOMINATE BILAT MOD SED  10/18/2018  . IR ANGIO INTRA EXTRACRAN SEL COM CAROTID INNOMINATE BILAT MOD SED  08/18/2019  . IR ANGIO VERTEBRAL SEL SUBCLAVIAN INNOMINATE UNI R MOD SED  08/18/2019  . IR ANGIO VERTEBRAL SEL VERTEBRAL BILAT MOD SED  10/18/2018  . IR ANGIO VERTEBRAL SEL VERTEBRAL UNI L MOD SED  08/18/2019  . IR US GUIDE VASC ACCESS RIGHT  10/18/2018  . IR US GUIDE VASC ACCESS RIGHT  08/18/2019  . MYOMECTOMY    . RADIOLOGY WITH ANESTHESIA N/A 08/18/2019   Procedure: STENTING;  Surgeon: Luanne Bras, MD;  Location: [facility];  Service: Radiology;  Laterality: N/A;    MEDICATIONS: No current facility-administered medications for this encounter.   Marland Kitchen amLODipine (NORVASC) 10 MG tablet  . aspirin EC 81 MG tablet  . blood glucose meter kit and supplies KIT  . Blood Glucose Monitoring Suppl (TRUE METRIX METER) w/Device KIT  . Calcium Carbonate-Vitamin D (CALCIUM-D PO)  . carvedilol (COREG) 6.25 MG tablet  . Evolocumab (REPATHA SURECLICK) 242 MG/ML SOAJ  . glucose blood (TRUE METRIX BLOOD GLUCOSE TEST) test  strip  . hydrALAZINE (APRESOLINE) 50 MG tablet  . Insulin Degludec-Liraglutide (XULTOPHY) 100-3.6 UNIT-MG/ML SOPN  . Insulin Glargine-Lixisenatide (SOLIQUA) 100-33 UNT-MCG/ML SOPN  . Multiple Vitamin (MULTIVITAMIN PO)  . Omega-3 Fatty Acids (OMEGA-3 PLUS PO)  . telmisartan-hydrochlorothiazide (MICARDIS HCT) 40-12.5 MG tablet  . ticagrelor (BRILINTA) 90 MG TABS tablet  . vitamin C (ASCORBIC ACID) 250 MG tablet  Repatha is listed as currently not started.    Myra Gianotti, PA-C Surgical Short Stay/Anesthesiology [facility] Phone [phone] [facility] Phone [phone] 08/22/2019 3:02 PM

## 2019-08-22 NOTE — Progress Notes (Signed)
Unable to reach patient after several attempts.  LMOM with instructions for DOS.    PCP - Arnette Felts, FNP Cardiologist - Dr Dr Jacques [place] Neuro - Dr Naomie Dean  Chest x-ray - 11/29/18 (2V) EKG - 03/04/19 Stress Test - 05/20/19 ECHO - 10/08/18 Cardiac Cath - n/a  Sleep Study - Yes  CPAP - Uses CPAP nightly (Bring)  No insulin on DOS. . If your blood sugar is less than 70 mg/dL, you will need to treat for low blood sugar: o Treat a low blood sugar (less than 70 mg/dL) with  cup of clear juice (cranberry or apple), 4 glucose tablets, OR glucose gel. o Recheck blood sugar in 15 minutes after treatment (to make sure it is greater than 70 mg/dL). If your blood sugar is not greater than 70 mg/dL on recheck, call [phone] for further instructions.  Continue Brilinta and ASA per MD instructions.  Anesthesia review: Yes  STOP now taking any Aspirin (unless otherwise instructed by your surgeon), Aleve, Naproxen, Ibuprofen, Motrin, Advil, Goody's, BC's, all herbal medications, fish oil, and all vitamins.   Coronavirus Screening Covid test 08/21/19 was negative.

## 2019-08-25 ENCOUNTER — Inpatient Hospital Stay (HOSPITAL_COMMUNITY)
Admission: RE | Admit: 2019-08-25 | Discharge: 2019-11-01 | DRG: 003 | Disposition: A | Discharge status: Skilled Nursing Facility | Payer: Medicare HMO | Attending: Family Medicine | Admitting: Family Medicine

## 2019-08-25 ENCOUNTER — Encounter (HOSPITAL_COMMUNITY)
Admission: RE | Disposition: A | Discharge status: Skilled Nursing Facility | Payer: Self-pay | Source: Home / Self Care | Attending: Interventional Radiology

## 2019-08-25 ENCOUNTER — Other Ambulatory Visit (HOSPITAL_COMMUNITY): Payer: Medicare HMO

## 2019-08-25 ENCOUNTER — Inpatient Hospital Stay (HOSPITAL_COMMUNITY): Payer: Medicare HMO | Admitting: Vascular Surgery

## 2019-08-25 ENCOUNTER — Encounter (HOSPITAL_COMMUNITY): Payer: Self-pay | Admitting: Interventional Radiology

## 2019-08-25 ENCOUNTER — Other Ambulatory Visit (HOSPITAL_COMMUNITY): Payer: Self-pay

## 2019-08-25 ENCOUNTER — Other Ambulatory Visit: Payer: Self-pay

## 2019-08-25 ENCOUNTER — Inpatient Hospital Stay (HOSPITAL_COMMUNITY): Payer: Medicare HMO

## 2019-08-25 DIAGNOSIS — E1165 Type 2 diabetes mellitus with hyperglycemia: Secondary | ICD-10-CM | POA: Diagnosis present

## 2019-08-25 DIAGNOSIS — R5381 Other malaise: Secondary | ICD-10-CM | POA: Diagnosis not present

## 2019-08-25 DIAGNOSIS — Z7189 Other specified counseling: Secondary | ICD-10-CM | POA: Diagnosis not present

## 2019-08-25 DIAGNOSIS — I63231 Cerebral infarction due to unspecified occlusion or stenosis of right carotid arteries: Secondary | ICD-10-CM | POA: Diagnosis not present

## 2019-08-25 DIAGNOSIS — R0902 Hypoxemia: Secondary | ICD-10-CM | POA: Diagnosis not present

## 2019-08-25 DIAGNOSIS — E1122 Type 2 diabetes mellitus with diabetic chronic kidney disease: Secondary | ICD-10-CM | POA: Diagnosis present

## 2019-08-25 DIAGNOSIS — J9600 Acute respiratory failure, unspecified whether with hypoxia or hypercapnia: Secondary | ICD-10-CM

## 2019-08-25 DIAGNOSIS — E8770 Fluid overload, unspecified: Secondary | ICD-10-CM | POA: Diagnosis not present

## 2019-08-25 DIAGNOSIS — J811 Chronic pulmonary edema: Secondary | ICD-10-CM

## 2019-08-25 DIAGNOSIS — R0602 Shortness of breath: Secondary | ICD-10-CM

## 2019-08-25 DIAGNOSIS — R1310 Dysphagia, unspecified: Secondary | ICD-10-CM | POA: Diagnosis not present

## 2019-08-25 DIAGNOSIS — J690 Pneumonitis due to inhalation of food and vomit: Secondary | ICD-10-CM | POA: Diagnosis not present

## 2019-08-25 DIAGNOSIS — Z7902 Long term (current) use of antithrombotics/antiplatelets: Secondary | ICD-10-CM

## 2019-08-25 DIAGNOSIS — R29708 NIHSS score 8: Secondary | ICD-10-CM | POA: Diagnosis not present

## 2019-08-25 DIAGNOSIS — I69354 Hemiplegia and hemiparesis following cerebral infarction affecting left non-dominant side: Secondary | ICD-10-CM | POA: Diagnosis not present

## 2019-08-25 DIAGNOSIS — E639 Nutritional deficiency, unspecified: Secondary | ICD-10-CM | POA: Diagnosis not present

## 2019-08-25 DIAGNOSIS — I651 Occlusion and stenosis of basilar artery: Secondary | ICD-10-CM | POA: Diagnosis present

## 2019-08-25 DIAGNOSIS — Z6829 Body mass index (BMI) 29.0-29.9, adult: Secondary | ICD-10-CM

## 2019-08-25 DIAGNOSIS — K429 Umbilical hernia without obstruction or gangrene: Secondary | ICD-10-CM | POA: Diagnosis present

## 2019-08-25 DIAGNOSIS — I63511 Cerebral infarction due to unspecified occlusion or stenosis of right middle cerebral artery: Secondary | ICD-10-CM | POA: Diagnosis not present

## 2019-08-25 DIAGNOSIS — Y838 Other surgical procedures as the cause of abnormal reaction of the patient, or of later complication, without mention of misadventure at the time of the procedure: Secondary | ICD-10-CM | POA: Diagnosis not present

## 2019-08-25 DIAGNOSIS — Z531 Procedure and treatment not carried out because of patient's decision for reasons of belief and group pressure: Secondary | ICD-10-CM | POA: Diagnosis present

## 2019-08-25 DIAGNOSIS — E871 Hypo-osmolality and hyponatremia: Secondary | ICD-10-CM | POA: Diagnosis present

## 2019-08-25 DIAGNOSIS — N179 Acute kidney failure, unspecified: Secondary | ICD-10-CM | POA: Diagnosis not present

## 2019-08-25 DIAGNOSIS — Z794 Long term (current) use of insulin: Secondary | ICD-10-CM | POA: Diagnosis not present

## 2019-08-25 DIAGNOSIS — I499 Cardiac arrhythmia, unspecified: Secondary | ICD-10-CM | POA: Diagnosis not present

## 2019-08-25 DIAGNOSIS — R0603 Acute respiratory distress: Secondary | ICD-10-CM | POA: Diagnosis not present

## 2019-08-25 DIAGNOSIS — R918 Other nonspecific abnormal finding of lung field: Secondary | ICD-10-CM | POA: Diagnosis not present

## 2019-08-25 DIAGNOSIS — K661 Hemoperitoneum: Secondary | ICD-10-CM | POA: Diagnosis not present

## 2019-08-25 DIAGNOSIS — K449 Diaphragmatic hernia without obstruction or gangrene: Secondary | ICD-10-CM | POA: Diagnosis not present

## 2019-08-25 DIAGNOSIS — Z515 Encounter for palliative care: Secondary | ICD-10-CM

## 2019-08-25 DIAGNOSIS — I4430 Unspecified atrioventricular block: Secondary | ICD-10-CM | POA: Diagnosis not present

## 2019-08-25 DIAGNOSIS — E870 Hyperosmolality and hypernatremia: Secondary | ICD-10-CM | POA: Diagnosis not present

## 2019-08-25 DIAGNOSIS — Z930 Tracheostomy status: Secondary | ICD-10-CM

## 2019-08-25 DIAGNOSIS — Z20822 Contact with and (suspected) exposure to covid-19: Secondary | ICD-10-CM | POA: Diagnosis present

## 2019-08-25 DIAGNOSIS — R471 Dysarthria and anarthria: Secondary | ICD-10-CM | POA: Diagnosis not present

## 2019-08-25 DIAGNOSIS — R6520 Severe sepsis without septic shock: Secondary | ICD-10-CM | POA: Diagnosis not present

## 2019-08-25 DIAGNOSIS — J9811 Atelectasis: Secondary | ICD-10-CM | POA: Diagnosis not present

## 2019-08-25 DIAGNOSIS — D638 Anemia in other chronic diseases classified elsewhere: Secondary | ICD-10-CM | POA: Diagnosis present

## 2019-08-25 DIAGNOSIS — G4733 Obstructive sleep apnea (adult) (pediatric): Secondary | ICD-10-CM | POA: Diagnosis present

## 2019-08-25 DIAGNOSIS — J810 Acute pulmonary edema: Secondary | ICD-10-CM | POA: Diagnosis not present

## 2019-08-25 DIAGNOSIS — L7622 Postprocedural hemorrhage and hematoma of skin and subcutaneous tissue following other procedure: Secondary | ICD-10-CM | POA: Diagnosis not present

## 2019-08-25 DIAGNOSIS — L89322 Pressure ulcer of left buttock, stage 2: Secondary | ICD-10-CM | POA: Diagnosis not present

## 2019-08-25 DIAGNOSIS — R414 Neurologic neglect syndrome: Secondary | ICD-10-CM | POA: Diagnosis not present

## 2019-08-25 DIAGNOSIS — E119 Type 2 diabetes mellitus without complications: Secondary | ICD-10-CM | POA: Diagnosis not present

## 2019-08-25 DIAGNOSIS — I6501 Occlusion and stenosis of right vertebral artery: Secondary | ICD-10-CM | POA: Diagnosis not present

## 2019-08-25 DIAGNOSIS — J9601 Acute respiratory failure with hypoxia: Secondary | ICD-10-CM | POA: Diagnosis not present

## 2019-08-25 DIAGNOSIS — E1159 Type 2 diabetes mellitus with other circulatory complications: Secondary | ICD-10-CM | POA: Diagnosis not present

## 2019-08-25 DIAGNOSIS — R4182 Altered mental status, unspecified: Secondary | ICD-10-CM | POA: Diagnosis not present

## 2019-08-25 DIAGNOSIS — R1312 Dysphagia, oropharyngeal phase: Secondary | ICD-10-CM | POA: Diagnosis not present

## 2019-08-25 DIAGNOSIS — R578 Other shock: Secondary | ICD-10-CM | POA: Diagnosis not present

## 2019-08-25 DIAGNOSIS — A419 Sepsis, unspecified organism: Secondary | ICD-10-CM

## 2019-08-25 DIAGNOSIS — I700 Atherosclerosis of aorta: Secondary | ICD-10-CM | POA: Diagnosis not present

## 2019-08-25 DIAGNOSIS — R54 Age-related physical debility: Secondary | ICD-10-CM | POA: Diagnosis present

## 2019-08-25 DIAGNOSIS — Z978 Presence of other specified devices: Secondary | ICD-10-CM | POA: Diagnosis not present

## 2019-08-25 DIAGNOSIS — I10 Essential (primary) hypertension: Secondary | ICD-10-CM | POA: Diagnosis present

## 2019-08-25 DIAGNOSIS — D500 Iron deficiency anemia secondary to blood loss (chronic): Secondary | ICD-10-CM | POA: Diagnosis not present

## 2019-08-25 DIAGNOSIS — Z9862 Peripheral vascular angioplasty status: Secondary | ICD-10-CM | POA: Diagnosis not present

## 2019-08-25 DIAGNOSIS — Z713 Dietary counseling and surveillance: Secondary | ICD-10-CM

## 2019-08-25 DIAGNOSIS — Z931 Gastrostomy status: Secondary | ICD-10-CM | POA: Diagnosis not present

## 2019-08-25 DIAGNOSIS — G9341 Metabolic encephalopathy: Secondary | ICD-10-CM | POA: Diagnosis not present

## 2019-08-25 DIAGNOSIS — D649 Anemia, unspecified: Secondary | ICD-10-CM | POA: Diagnosis not present

## 2019-08-25 DIAGNOSIS — I6381 Other cerebral infarction due to occlusion or stenosis of small artery: Secondary | ICD-10-CM | POA: Diagnosis not present

## 2019-08-25 DIAGNOSIS — Z7401 Bed confinement status: Secondary | ICD-10-CM | POA: Diagnosis not present

## 2019-08-25 DIAGNOSIS — J9602 Acute respiratory failure with hypercapnia: Secondary | ICD-10-CM | POA: Diagnosis not present

## 2019-08-25 DIAGNOSIS — R531 Weakness: Secondary | ICD-10-CM | POA: Diagnosis not present

## 2019-08-25 DIAGNOSIS — I6350 Cerebral infarction due to unspecified occlusion or stenosis of unspecified cerebral artery: Secondary | ICD-10-CM | POA: Diagnosis not present

## 2019-08-25 DIAGNOSIS — I129 Hypertensive chronic kidney disease with stage 1 through stage 4 chronic kidney disease, or unspecified chronic kidney disease: Secondary | ICD-10-CM | POA: Diagnosis present

## 2019-08-25 DIAGNOSIS — G8194 Hemiplegia, unspecified affecting left nondominant side: Secondary | ICD-10-CM | POA: Diagnosis not present

## 2019-08-25 DIAGNOSIS — I771 Stricture of artery: Secondary | ICD-10-CM

## 2019-08-25 DIAGNOSIS — L8990 Pressure ulcer of unspecified site, unspecified stage: Secondary | ICD-10-CM | POA: Insufficient documentation

## 2019-08-25 DIAGNOSIS — I6521 Occlusion and stenosis of right carotid artery: Principal | ICD-10-CM | POA: Diagnosis present

## 2019-08-25 DIAGNOSIS — Z01818 Encounter for other preprocedural examination: Secondary | ICD-10-CM

## 2019-08-25 DIAGNOSIS — D72829 Elevated white blood cell count, unspecified: Secondary | ICD-10-CM | POA: Diagnosis not present

## 2019-08-25 DIAGNOSIS — J95851 Ventilator associated pneumonia: Secondary | ICD-10-CM | POA: Diagnosis present

## 2019-08-25 DIAGNOSIS — R58 Hemorrhage, not elsewhere classified: Secondary | ICD-10-CM | POA: Diagnosis not present

## 2019-08-25 DIAGNOSIS — Z9911 Dependence on respirator [ventilator] status: Secondary | ICD-10-CM | POA: Diagnosis not present

## 2019-08-25 DIAGNOSIS — I63513 Cerebral infarction due to unspecified occlusion or stenosis of bilateral middle cerebral arteries: Secondary | ICD-10-CM | POA: Diagnosis not present

## 2019-08-25 DIAGNOSIS — Z4589 Encounter for adjustment and management of other implanted devices: Secondary | ICD-10-CM

## 2019-08-25 DIAGNOSIS — J9621 Acute and chronic respiratory failure with hypoxia: Secondary | ICD-10-CM | POA: Diagnosis not present

## 2019-08-25 DIAGNOSIS — E876 Hypokalemia: Secondary | ICD-10-CM | POA: Diagnosis not present

## 2019-08-25 DIAGNOSIS — Z452 Encounter for adjustment and management of vascular access device: Secondary | ICD-10-CM | POA: Diagnosis not present

## 2019-08-25 DIAGNOSIS — N182 Chronic kidney disease, stage 2 (mild): Secondary | ICD-10-CM | POA: Diagnosis present

## 2019-08-25 DIAGNOSIS — I639 Cerebral infarction, unspecified: Secondary | ICD-10-CM | POA: Diagnosis present

## 2019-08-25 DIAGNOSIS — E11319 Type 2 diabetes mellitus with unspecified diabetic retinopathy without macular edema: Secondary | ICD-10-CM | POA: Diagnosis not present

## 2019-08-25 DIAGNOSIS — D62 Acute posthemorrhagic anemia: Secondary | ICD-10-CM

## 2019-08-25 DIAGNOSIS — E11649 Type 2 diabetes mellitus with hypoglycemia without coma: Secondary | ICD-10-CM | POA: Diagnosis not present

## 2019-08-25 DIAGNOSIS — R451 Restlessness and agitation: Secondary | ICD-10-CM | POA: Diagnosis not present

## 2019-08-25 DIAGNOSIS — Z7982 Long term (current) use of aspirin: Secondary | ICD-10-CM

## 2019-08-25 DIAGNOSIS — R062 Wheezing: Secondary | ICD-10-CM

## 2019-08-25 DIAGNOSIS — I469 Cardiac arrest, cause unspecified: Secondary | ICD-10-CM | POA: Diagnosis not present

## 2019-08-25 DIAGNOSIS — Z833 Family history of diabetes mellitus: Secondary | ICD-10-CM

## 2019-08-25 DIAGNOSIS — L7632 Postprocedural hematoma of skin and subcutaneous tissue following other procedure: Secondary | ICD-10-CM | POA: Diagnosis not present

## 2019-08-25 DIAGNOSIS — R4020 Unspecified coma: Secondary | ICD-10-CM | POA: Diagnosis not present

## 2019-08-25 DIAGNOSIS — M2550 Pain in unspecified joint: Secondary | ICD-10-CM | POA: Diagnosis not present

## 2019-08-25 DIAGNOSIS — E1151 Type 2 diabetes mellitus with diabetic peripheral angiopathy without gangrene: Secondary | ICD-10-CM | POA: Diagnosis present

## 2019-08-25 DIAGNOSIS — M7981 Nontraumatic hematoma of soft tissue: Secondary | ICD-10-CM | POA: Diagnosis not present

## 2019-08-25 DIAGNOSIS — J9690 Respiratory failure, unspecified, unspecified whether with hypoxia or hypercapnia: Secondary | ICD-10-CM | POA: Diagnosis not present

## 2019-08-25 DIAGNOSIS — I2510 Atherosclerotic heart disease of native coronary artery without angina pectoris: Secondary | ICD-10-CM | POA: Diagnosis not present

## 2019-08-25 DIAGNOSIS — L89152 Pressure ulcer of sacral region, stage 2: Secondary | ICD-10-CM | POA: Diagnosis not present

## 2019-08-25 DIAGNOSIS — B9689 Other specified bacterial agents as the cause of diseases classified elsewhere: Secondary | ICD-10-CM | POA: Diagnosis not present

## 2019-08-25 DIAGNOSIS — E785 Hyperlipidemia, unspecified: Secondary | ICD-10-CM | POA: Diagnosis present

## 2019-08-25 DIAGNOSIS — R29810 Facial weakness: Secondary | ICD-10-CM | POA: Diagnosis not present

## 2019-08-25 DIAGNOSIS — R404 Transient alteration of awareness: Secondary | ICD-10-CM | POA: Diagnosis not present

## 2019-08-25 DIAGNOSIS — I6389 Other cerebral infarction: Secondary | ICD-10-CM | POA: Diagnosis not present

## 2019-08-25 DIAGNOSIS — R197 Diarrhea, unspecified: Secondary | ICD-10-CM | POA: Diagnosis not present

## 2019-08-25 DIAGNOSIS — J90 Pleural effusion, not elsewhere classified: Secondary | ICD-10-CM | POA: Diagnosis not present

## 2019-08-25 DIAGNOSIS — R061 Stridor: Secondary | ICD-10-CM | POA: Diagnosis not present

## 2019-08-25 DIAGNOSIS — I6602 Occlusion and stenosis of left middle cerebral artery: Secondary | ICD-10-CM | POA: Diagnosis not present

## 2019-08-25 DIAGNOSIS — E872 Acidosis: Secondary | ICD-10-CM | POA: Diagnosis not present

## 2019-08-25 DIAGNOSIS — I69391 Dysphagia following cerebral infarction: Secondary | ICD-10-CM | POA: Diagnosis not present

## 2019-08-25 DIAGNOSIS — Z79899 Other long term (current) drug therapy: Secondary | ICD-10-CM | POA: Diagnosis not present

## 2019-08-25 DIAGNOSIS — H9193 Unspecified hearing loss, bilateral: Secondary | ICD-10-CM | POA: Diagnosis present

## 2019-08-25 DIAGNOSIS — J9620 Acute and chronic respiratory failure, unspecified whether with hypoxia or hypercapnia: Secondary | ICD-10-CM | POA: Diagnosis not present

## 2019-08-25 DIAGNOSIS — Z95828 Presence of other vascular implants and grafts: Secondary | ICD-10-CM | POA: Diagnosis not present

## 2019-08-25 DIAGNOSIS — Z4682 Encounter for fitting and adjustment of non-vascular catheter: Secondary | ICD-10-CM | POA: Diagnosis not present

## 2019-08-25 DIAGNOSIS — E669 Obesity, unspecified: Secondary | ICD-10-CM | POA: Diagnosis present

## 2019-08-25 DIAGNOSIS — I6340 Cerebral infarction due to embolism of unspecified cerebral artery: Secondary | ICD-10-CM | POA: Diagnosis not present

## 2019-08-25 DIAGNOSIS — Z8673 Personal history of transient ischemic attack (TIA), and cerebral infarction without residual deficits: Secondary | ICD-10-CM | POA: Diagnosis not present

## 2019-08-25 DIAGNOSIS — L8995 Pressure ulcer of unspecified site, unstageable: Secondary | ICD-10-CM | POA: Diagnosis not present

## 2019-08-25 DIAGNOSIS — E7800 Pure hypercholesterolemia, unspecified: Secondary | ICD-10-CM | POA: Diagnosis not present

## 2019-08-25 DIAGNOSIS — A408 Other streptococcal sepsis: Secondary | ICD-10-CM | POA: Diagnosis not present

## 2019-08-25 DIAGNOSIS — Z823 Family history of stroke: Secondary | ICD-10-CM

## 2019-08-25 DIAGNOSIS — Z9889 Other specified postprocedural states: Secondary | ICD-10-CM | POA: Diagnosis not present

## 2019-08-25 DIAGNOSIS — Z9119 Patient's noncompliance with other medical treatment and regimen: Secondary | ICD-10-CM

## 2019-08-25 DIAGNOSIS — S301XXA Contusion of abdominal wall, initial encounter: Secondary | ICD-10-CM | POA: Diagnosis not present

## 2019-08-25 DIAGNOSIS — J189 Pneumonia, unspecified organism: Secondary | ICD-10-CM | POA: Diagnosis not present

## 2019-08-25 DIAGNOSIS — S7011XA Contusion of right thigh, initial encounter: Secondary | ICD-10-CM | POA: Diagnosis not present

## 2019-08-25 HISTORY — PX: RADIOLOGY WITH ANESTHESIA: SHX6223

## 2019-08-25 HISTORY — PX: IR US GUIDE VASC ACCESS RIGHT: IMG2390

## 2019-08-25 HISTORY — PX: IR PTA INTRACRANIAL: IMG2344

## 2019-08-25 LAB — CBC WITH DIFFERENTIAL/PLATELET
Abs Immature Granulocytes: 0.01 10*3/uL (ref 0.00–0.07)
Basophils Absolute: 0 10*3/uL (ref 0.0–0.1)
Basophils Relative: 1 %
Eosinophils Absolute: 0.1 10*3/uL (ref 0.0–0.5)
Eosinophils Relative: 1 %
HCT: 42.2 % (ref 36.0–46.0)
Hemoglobin: 13.3 g/dL (ref 12.0–15.0)
Immature Granulocytes: 0 %
Lymphocytes Relative: 40 %
Lymphs Abs: 1.9 10*3/uL (ref 0.7–4.0)
MCH: 28.4 pg (ref 26.0–34.0)
MCHC: 31.5 g/dL (ref 30.0–36.0)
MCV: 90.2 fL (ref 80.0–100.0)
Monocytes Absolute: 0.4 10*3/uL (ref 0.1–1.0)
Monocytes Relative: 9 %
Neutro Abs: 2.4 10*3/uL (ref 1.7–7.7)
Neutrophils Relative %: 49 %
Platelets: 283 10*3/uL (ref 150–400)
RBC: 4.68 MIL/uL (ref 3.87–5.11)
RDW: 12.8 % (ref 11.5–15.5)
WBC: 4.8 10*3/uL (ref 4.0–10.5)
nRBC: 0 % (ref 0.0–0.2)

## 2019-08-25 LAB — POCT ACTIVATED CLOTTING TIME
Activated Clotting Time: 219 seconds
Activated Clotting Time: 323 seconds
Activated Clotting Time: 291 seconds
Activated Clotting Time: 318 seconds

## 2019-08-25 LAB — HEPARIN LEVEL (UNFRACTIONATED)
Heparin Unfractionated: 0.76 IU/mL — ABNORMAL HIGH (ref 0.30–0.70)
Heparin Unfractionated: 0.6 IU/mL (ref 0.30–0.70)

## 2019-08-25 LAB — GLUCOSE, CAPILLARY
Glucose-Capillary: 166 mg/dL — ABNORMAL HIGH (ref 70–99)
Glucose-Capillary: 172 mg/dL — ABNORMAL HIGH (ref 70–99)
Glucose-Capillary: 259 mg/dL — ABNORMAL HIGH (ref 70–99)
Glucose-Capillary: 306 mg/dL — ABNORMAL HIGH (ref 70–99)

## 2019-08-25 LAB — NO BLOOD PRODUCTS

## 2019-08-25 LAB — MRSA PCR SCREENING: MRSA by PCR: NEGATIVE

## 2019-08-25 SURGERY — MRI WITH ANESTHESIA
Anesthesia: General

## 2019-08-25 MED ORDER — NITROGLYCERIN 1 MG/10 ML FOR IR/CATH LAB
INTRA_ARTERIAL | Status: AC
  Filled 2019-08-25: qty 10

## 2019-08-25 MED ORDER — HEPARIN SODIUM (PORCINE) 1000 UNIT/ML IJ SOLN
INTRAMUSCULAR | Status: AC
  Filled 2019-08-25: qty 1

## 2019-08-25 MED ORDER — MIDAZOLAM HCL 5 MG/5ML IJ SOLN
INTRAMUSCULAR | Status: DC | PRN
  Administered 2019-08-25: 2 mg via INTRAVENOUS

## 2019-08-25 MED ORDER — ASPIRIN 81 MG PO CHEW
81.0000 mg | CHEWABLE_TABLET | Freq: Every day | ORAL | Status: DC
  Administered 2019-08-26: 81 mg via ORAL
  Filled 2019-08-25: qty 1

## 2019-08-25 MED ORDER — CHLORHEXIDINE GLUCONATE 0.12 % MT SOLN
15.0000 mL | Freq: Once | OROMUCOSAL | Status: AC
  Administered 2019-08-25: 15 mL via OROMUCOSAL
  Filled 2019-08-25: qty 15

## 2019-08-25 MED ORDER — ORAL CARE MOUTH RINSE: 15.0000 mL | Freq: Once | OROMUCOSAL | Status: AC

## 2019-08-25 MED ORDER — HEPARIN SODIUM (PORCINE) 1000 UNIT/ML IJ SOLN
INTRAMUSCULAR | Status: DC | PRN
  Administered 2019-08-25: 1000 [IU] via INTRAVENOUS

## 2019-08-25 MED ORDER — ASPIRIN 81 MG PO CHEW: 81.0000 mg | CHEWABLE_TABLET | Freq: Every day | ORAL | Status: DC

## 2019-08-25 MED ORDER — ROCURONIUM BROMIDE 100 MG/10ML IV SOLN
INTRAVENOUS | Status: DC | PRN
Start: 2019-08-25 — End: 2019-08-25
  Administered 2019-08-25: 40 mg via INTRAVENOUS
  Administered 2019-08-25: 10 mg via INTRAVENOUS

## 2019-08-25 MED ORDER — SODIUM CHLORIDE 0.9 % IV SOLN: INTRAVENOUS | Status: DC

## 2019-08-25 MED ORDER — LACTATED RINGERS IV SOLN: INTRAVENOUS | Status: DC

## 2019-08-25 MED ORDER — TICAGRELOR 90 MG PO TABS
90.0000 mg | ORAL_TABLET | Freq: Two times a day (BID) | ORAL | Status: DC
  Administered 2019-08-25 – 2019-08-26 (×3): 90 mg via ORAL
  Filled 2019-08-25 (×3): qty 1

## 2019-08-25 MED ORDER — ONDANSETRON HCL 4 MG/2ML IJ SOLN
INTRAMUSCULAR | Status: DC | PRN
  Administered 2019-08-25: 4 mg via INTRAVENOUS

## 2019-08-25 MED ORDER — ACETAMINOPHEN 650 MG RE SUPP: 650.0000 mg | RECTAL | Status: DC | PRN

## 2019-08-25 MED ORDER — FENTANYL CITRATE (PF) 100 MCG/2ML IJ SOLN
INTRAMUSCULAR | Status: DC | PRN
  Administered 2019-08-25 (×4): 25 ug via INTRAVENOUS

## 2019-08-25 MED ORDER — CARVEDILOL 3.125 MG PO TABS
6.2500 mg | ORAL_TABLET | Freq: Two times a day (BID) | ORAL | Status: AC
  Administered 2019-08-26: 6.25 mg via ORAL
  Filled 2019-08-25: qty 2

## 2019-08-25 MED ORDER — LIDOCAINE HCL 1 % IJ SOLN
INTRAMUSCULAR | Status: AC
  Filled 2019-08-25: qty 20

## 2019-08-25 MED ORDER — ACETAMINOPHEN 160 MG/5ML PO SOLN
650.0000 mg | ORAL | Status: DC | PRN
  Administered 2019-08-27 – 2019-10-26 (×40): 650 mg
  Filled 2019-08-25 (×41): qty 20.3

## 2019-08-25 MED ORDER — ASPIRIN 81 MG PO CHEW: 243.0000 mg | CHEWABLE_TABLET | Freq: Once | ORAL | Status: AC

## 2019-08-25 MED ORDER — HEPARIN (PORCINE) 25000 UT/250ML-% IV SOLN: 300.0000 [IU]/h | INTRAVENOUS | Status: AC

## 2019-08-25 MED ORDER — GLYCOPYRROLATE PF 0.2 MG/ML IJ SOSY
PREFILLED_SYRINGE | INTRAMUSCULAR | Status: DC | PRN
  Administered 2019-08-25: .1 mg via INTRAVENOUS

## 2019-08-25 MED ORDER — PROTAMINE SULFATE 10 MG/ML IV SOLN
INTRAVENOUS | Status: DC | PRN
  Administered 2019-08-25: 10 mg via INTRAVENOUS

## 2019-08-25 MED ORDER — CLEVIDIPINE BUTYRATE 0.5 MG/ML IV EMUL
0.0000 mg/h | INTRAVENOUS | Status: AC
  Administered 2019-08-26: 17 mg/h via INTRAVENOUS
  Administered 2019-08-26: 1 mg/h via INTRAVENOUS
  Administered 2019-08-26: 12 mg/h via INTRAVENOUS
  Administered 2019-08-26: 19 mg/h via INTRAVENOUS
  Administered 2019-08-26: 15 mg/h via INTRAVENOUS
  Filled 2019-08-25 (×9): qty 50

## 2019-08-25 MED ORDER — HEPARIN SODIUM (PORCINE) 1000 UNIT/ML IJ SOLN
INTRAMUSCULAR | Status: DC | PRN
  Administered 2019-08-25: 2000 [IU] via INTRA_ARTERIAL

## 2019-08-25 MED ORDER — AMLODIPINE BESYLATE 10 MG PO TABS
10.0000 mg | ORAL_TABLET | Freq: Every day | ORAL | Status: DC
  Administered 2019-08-26: 10 mg via ORAL
  Filled 2019-08-25: qty 1

## 2019-08-25 MED ORDER — IRBESARTAN 150 MG PO TABS
150.0000 mg | ORAL_TABLET | Freq: Every day | ORAL | Status: DC
  Administered 2019-08-26: 150 mg via ORAL
  Filled 2019-08-25: qty 1

## 2019-08-25 MED ORDER — HYDRALAZINE HCL 50 MG PO TABS
50.0000 mg | ORAL_TABLET | Freq: Two times a day (BID) | ORAL | Status: AC
  Administered 2019-08-26: 50 mg via ORAL
  Filled 2019-08-25: qty 1

## 2019-08-25 MED ORDER — CLEVIDIPINE BUTYRATE 0.5 MG/ML IV EMUL
INTRAVENOUS | Status: AC
  Administered 2019-08-25: 8 mg/h via INTRAVENOUS
  Filled 2019-08-25: qty 50

## 2019-08-25 MED ORDER — HEPARIN (PORCINE) 25000 UT/250ML-% IV SOLN
INTRAVENOUS | Status: AC
  Administered 2019-08-25: 500 [IU]/h via INTRAVENOUS
  Filled 2019-08-25: qty 250

## 2019-08-25 MED ORDER — VERAPAMIL HCL 2.5 MG/ML IV SOLN
INTRAVENOUS | Status: AC
  Filled 2019-08-25: qty 2

## 2019-08-25 MED ORDER — ASPIRIN 81 MG PO CHEW
CHEWABLE_TABLET | ORAL | Status: AC
  Administered 2019-08-25: 243 mg via ORAL
  Filled 2019-08-25: qty 3

## 2019-08-25 MED ORDER — INSULIN ASPART 100 UNIT/ML ~~LOC~~ SOLN
0.0000 [IU] | Freq: Three times a day (TID) | SUBCUTANEOUS | Status: DC
  Administered 2019-08-25 – 2019-08-26 (×2): 8 [IU] via SUBCUTANEOUS
  Administered 2019-08-26 (×2): 5 [IU] via SUBCUTANEOUS

## 2019-08-25 MED ORDER — EPTIFIBATIDE 20 MG/10ML IV SOLN
INTRAVENOUS | Status: DC | PRN
  Administered 2019-08-25 (×3): 1.5 mL via INTRAVENOUS

## 2019-08-25 MED ORDER — LIDOCAINE 2% (20 MG/ML) 5 ML SYRINGE
INTRAMUSCULAR | Status: DC | PRN
  Administered 2019-08-25: 60 mg via INTRAVENOUS

## 2019-08-25 MED ORDER — FENTANYL CITRATE (PF) 100 MCG/2ML IJ SOLN: 25.0000 ug | INTRAMUSCULAR | Status: DC | PRN

## 2019-08-25 MED ORDER — SUCCINYLCHOLINE CHLORIDE 20 MG/ML IJ SOLN
INTRAMUSCULAR | Status: DC | PRN
  Administered 2019-08-25: 140 mg via INTRAVENOUS

## 2019-08-25 MED ORDER — NIMODIPINE 30 MG PO CAPS
0.0000 mg | ORAL_CAPSULE | ORAL | Status: AC
  Administered 2019-08-25: 60 mg via ORAL
  Filled 2019-08-25: qty 2

## 2019-08-25 MED ORDER — VERAPAMIL HCL 2.5 MG/ML IV SOLN: INTRA_ARTERIAL | Status: DC | PRN

## 2019-08-25 MED ORDER — PHENYLEPHRINE HCL-NACL 10-0.9 MG/250ML-% IV SOLN
INTRAVENOUS | Status: DC | PRN
  Administered 2019-08-25: 30 ug/min via INTRAVENOUS

## 2019-08-25 MED ORDER — CLEVIDIPINE BUTYRATE 0.5 MG/ML IV EMUL
INTRAVENOUS | Status: DC | PRN
  Administered 2019-08-25: 1 mg/h via INTRAVENOUS

## 2019-08-25 MED ORDER — HYDROCHLOROTHIAZIDE 12.5 MG PO CAPS
12.5000 mg | ORAL_CAPSULE | Freq: Every day | ORAL | Status: AC
  Administered 2019-08-26: 12.5 mg via ORAL
  Filled 2019-08-25: qty 1

## 2019-08-25 MED ORDER — ASPIRIN EC 325 MG PO TBEC
325.0000 mg | DELAYED_RELEASE_TABLET | ORAL | Status: DC
  Filled 2019-08-25: qty 1

## 2019-08-25 MED ORDER — TELMISARTAN-HCTZ 40-12.5 MG PO TABS: 1.0000 | ORAL_TABLET | Freq: Every day | ORAL | Status: DC

## 2019-08-25 MED ORDER — IOHEXOL 300 MG/ML  SOLN
150.0000 mL | Freq: Once | INTRAMUSCULAR | Status: AC | PRN
  Administered 2019-08-25: 75 mL via INTRA_ARTERIAL

## 2019-08-25 MED ORDER — TICAGRELOR 90 MG PO TABS: 90.0000 mg | ORAL_TABLET | Freq: Two times a day (BID) | ORAL | Status: DC

## 2019-08-25 MED ORDER — PROPOFOL 10 MG/ML IV BOLUS
INTRAVENOUS | Status: DC | PRN
  Administered 2019-08-25: 120 mg via INTRAVENOUS

## 2019-08-25 MED ORDER — CEFAZOLIN SODIUM-DEXTROSE 2-4 GM/100ML-% IV SOLN
2.0000 g | INTRAVENOUS | Status: AC
  Administered 2019-08-25: 2 g via INTRAVENOUS
  Filled 2019-08-25: qty 100

## 2019-08-25 MED ORDER — SUGAMMADEX SODIUM 200 MG/2ML IV SOLN
INTRAVENOUS | Status: DC | PRN
Start: 2019-08-25 — End: 2019-08-25
  Administered 2019-08-25 (×2): 100 mg via INTRAVENOUS

## 2019-08-25 MED ORDER — PROPOFOL 500 MG/50ML IV EMUL
INTRAVENOUS | Status: DC | PRN
Start: 2019-08-25 — End: 2019-08-25
  Administered 2019-08-25: 50 ug/kg/min via INTRAVENOUS

## 2019-08-25 MED ORDER — IOHEXOL 300 MG/ML  SOLN
150.0000 mL | Freq: Once | INTRAMUSCULAR | Status: AC | PRN
  Administered 2019-08-25: 25 mL via INTRA_ARTERIAL

## 2019-08-25 MED ORDER — NITROGLYCERIN 1 MG/10 ML FOR IR/CATH LAB
INTRA_ARTERIAL | Status: DC | PRN
  Administered 2019-08-25: 200 ug via INTRA_ARTERIAL

## 2019-08-25 MED ORDER — ACETAMINOPHEN 325 MG PO TABS
650.0000 mg | ORAL_TABLET | ORAL | Status: DC | PRN
  Administered 2019-09-27 – 2019-10-24 (×3): 650 mg via ORAL
  Filled 2019-08-25 (×3): qty 2

## 2019-08-25 MED ORDER — CHLORHEXIDINE GLUCONATE CLOTH 2 % EX PADS
6.0000 | MEDICATED_PAD | Freq: Every day | CUTANEOUS | Status: DC
  Administered 2019-08-25 – 2019-09-02 (×8): 6 via TOPICAL

## 2019-08-25 MED ORDER — EPTIFIBATIDE 20 MG/10ML IV SOLN
INTRAVENOUS | Status: AC
  Filled 2019-08-25: qty 10

## 2019-08-25 MED ORDER — PHENYLEPHRINE 40 MCG/ML (10ML) SYRINGE FOR IV PUSH (FOR BLOOD PRESSURE SUPPORT)
PREFILLED_SYRINGE | INTRAVENOUS | Status: DC | PRN
  Administered 2019-08-25 (×2): 80 ug via INTRAVENOUS
  Administered 2019-08-25: 120 ug via INTRAVENOUS

## 2019-08-25 MED ORDER — IOHEXOL 240 MG/ML SOLN
INTRAMUSCULAR | Status: AC
  Filled 2019-08-25: qty 200

## 2019-08-25 MED ORDER — CLEVIDIPINE BUTYRATE 0.5 MG/ML IV EMUL
INTRAVENOUS | Status: AC
  Administered 2019-08-25: 2 mg/h via INTRAVENOUS
  Filled 2019-08-25: qty 50

## 2019-08-25 MED ORDER — HEPARIN (PORCINE) 25000 UT/250ML-% IV SOLN
500.0000 [IU]/h | INTRAVENOUS | Status: DC
  Filled 2019-08-25: qty 250

## 2019-08-25 NOTE — Anesthesia Procedure Notes (Signed)
Arterial Line Insertion Start/End6/14/2021 8:00 AM Performed by: Laruth Bouchard., CRNA, CRNA  Patient location: Pre-op. Preanesthetic checklist: patient identified, IV checked, site marked, risks and benefits discussed, surgical consent, monitors and equipment checked, pre-op evaluation, timeout performed and anesthesia consent Lidocaine 1% used for infiltration Left, radial was placed Catheter size: 18 G Hand hygiene performed  and maximum sterile barriers used   Attempts: 1 Procedure performed without using ultrasound guided technique. Following insertion, dressing applied and Biopatch. Post procedure assessment: normal  Patient tolerated the procedure well with no immediate complications.

## 2019-08-25 NOTE — H&P (Signed)
Chief Complaint: Patient was seen in consultation today for cerebral arteriogram with right internal carotid artery angioplasty/stent placement at the request of Dr Myrla Halsted   Supervising Physician: Luanne Bras  Patient Status: [facility] - Out-pt  History of Present Illness: Donna [name] is a 69 y.o. female   Hypertension, hyperlipidemia, CVA 06/2018, diabetes mellitus, anemia, obesity, OSA, and arthritis. She is known to [facility] and has been followed by Dr. Estanislado Pandy since 10/2018. She first presented to our department at the request of Dr. Jaynee Eagles for management of intracranial stenosis, thought to be cause of recent CVA at that time. She underwentan image-guided diagnostic cerebral arteriogram 10/18/2018 by Dr. 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  Pt was scheduled 08/18/19 for cerebral arteriogram with possible R ICA/MCA vs L VBJ angioplasty stent placement. Was unable to check P2y12 that day-- national back order of tubes. Was determined best to move ahead with cerebral arteriogram to determine which vessel would need intervention more urgently.  Arteriogram 6/7: IMPRESSION: Approximately 75-80% stenosis of the mid M1 segment of the of the right middle cerebral artery with moderate stenosis of the proximal inferior division. Approximately 80% stenosis at the junction of the cervical and petrous right ICA. Approximately 50% stenosis at the origin of the right vertebral artery, and 50% at the right vertebrobasilar junction. Approximately 90% stenosis of the left vertebrobasilar junction distal to the origin of the left posterior-inferior cerebellar artery. Approximately 75% narrowing of the origin of the left external carotid artery. Suspicion of high-grade possibly 75% narrowing at left internal carotid artery bulb. 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  PLAN: Findings reviewed with the patient,  and her sister. Plan further management regarding above angiographic findings. Given the patient's previous history of right cerebral hemispheric ischemic stroke, initial revascularization of the right internal carotid artery petrous cervical junction would be an option. Patient advised to continue with her present medical regimen  Now scheduled today for Right internal carotid artery angioplasty/stent placement in IR with Dr Estanislado Pandy  Taking Brilinta BID and ASA 81 mg daily  Past Medical History:  Diagnosis Date  . Anemia    low iron - not since having fibroids removed  . Arthritis   . Diabetes mellitus without complication ([place])    type   . Hyperlipidemia   . Hypertension   . Sleep apnea    just recently diagnosed with it, has not gotten the Cpap (done 10/29/18)  . Stroke [facility])    weakness on left side  . Type II diabetes mellitus ([place])     Past Surgical History:  Procedure Laterality Date  . COLONOSCOPY    . IR ANGIO INTRA EXTRACRAN SEL COM CAROTID INNOMINATE BILAT MOD SED  10/18/2018  . IR ANGIO INTRA EXTRACRAN SEL COM CAROTID INNOMINATE BILAT MOD SED  08/18/2019  . IR ANGIO VERTEBRAL SEL SUBCLAVIAN INNOMINATE UNI R MOD SED  08/18/2019  . IR ANGIO VERTEBRAL SEL VERTEBRAL BILAT MOD SED  10/18/2018  . IR ANGIO VERTEBRAL SEL VERTEBRAL UNI L MOD SED  08/18/2019  . IR US GUIDE VASC ACCESS RIGHT  10/18/2018  . IR US GUIDE VASC ACCESS RIGHT  08/18/2019  . MYOMECTOMY    . RADIOLOGY WITH ANESTHESIA N/A 08/18/2019   Procedure: STENTING;  Surgeon: Luanne Bras, MD;  Location: [place];  Service: Radiology;  Laterality: N/A;    Allergies: Patient has no known allergies.  Medications: Prior to Admission medications   Medication  Sig Start Date End Date Taking? Authorizing Provider  amLODipine (NORVASC) 10 MG tablet Take 1 tablet (10 mg total) by mouth daily. 05/12/19  Yes Minette Brine, FNP  aspirin EC 81 MG tablet Take 81 mg by mouth every evening.   Yes [provider]  Calcium  Carbonate-Vitamin D (CALCIUM-D PO) Take 2 tablets by mouth daily at 12 noon.    Yes [provider]  carvedilol (COREG) 6.25 MG tablet TAKE 1 TABLET(6.25 MG) BY MOUTH TWICE DAILY Patient taking differently: Take 6.25 mg by mouth 2 (two) times daily with a meal.  07/31/19  Yes Minette Brine, FNP  hydrALAZINE (APRESOLINE) 50 MG tablet Take 1 tablet (50 mg total) by mouth 2 (two) times daily. 06/16/19 09/14/19 Yes Elouise Munroe, MD  Insulin Degludec-Liraglutide (XULTOPHY) 100-3.6 UNIT-MG/ML SOPN Inject 30 Units into the skin daily. 08/14/19  Yes Minette Brine, FNP  telmisartan-hydrochlorothiazide (MICARDIS HCT) 40-12.5 MG tablet Take 1 tablet by mouth daily. 07/31/19  Yes Minette Brine, FNP  ticagrelor (BRILINTA) 90 MG TABS tablet Take 1 tablet (90 mg total) by mouth 2 (two) times daily. 06/12/19  Yes Elouise Munroe, MD  vitamin C (ASCORBIC ACID) 250 MG tablet Take 500 mg by mouth daily at 12 noon.   Yes [provider]  blood glucose meter kit and supplies KIT Dispense based on patient and insurance preference. Use up to four times daily as directed. (FOR ICD-9 250.00, 250.01). 05/10/18   Minette Brine, FNP  Blood Glucose Monitoring Suppl (TRUE METRIX METER) w/Device KIT 1 each by Does not apply route in the morning, at noon, in the evening, and at bedtime. 05/22/19   Minette Brine, FNP  Evolocumab (REPATHA SURECLICK) 631 MG/ML SOAJ Inject 140 mg into the skin every 14 (fourteen) days. 06/30/19   Elouise Munroe, MD  glucose blood (TRUE METRIX BLOOD GLUCOSE TEST) test strip Check blood sugar 4 times a day before meals and bedtime 05/22/19   Minette Brine, FNP  Insulin Glargine-Lixisenatide (SOLIQUA) 100-33 UNT-MCG/ML SOPN Inject 25 Units into the skin daily. Patient taking differently: Inject 30 Units into the skin daily.  07/14/19   Minette Brine, FNP  Multiple Vitamin (MULTIVITAMIN PO) Take 1 tablet by mouth daily at 12 noon.     [provider]  Omega-3 Fatty Acids (OMEGA-3  PLUS PO) Take 2 capsules by mouth daily at 12 noon.    [provider]     Family History  Problem Relation Age of Onset  . Stroke Mother   . Diabetes Father   . Diabetes Sister   . Breast cancer Neg Hx     Social History   Socioeconomic History  . Marital status: Legally Separated    Spouse name: Not on file  . Number of children: Not on file  . Years of education: Not on file  . Highest education level: Not on file  Occupational History  . Occupation: retired  Tobacco Use  . Smoking status: Never Smoker  . Smokeless tobacco: Never Used  Vaping Use  . Vaping Use: Never used  Substance and Sexual Activity  . Alcohol use: No  . Drug use: No  . Sexual activity: Not Currently  Other Topics Concern  . Not on file  Social History Narrative   Lives at home alone   Right handed   Social Determinants of Health   Financial Resource Strain:   . Difficulty of Paying Living Expenses:   Food Insecurity: No Food Insecurity  . Worried About Running  Out of Food in the Last Year: Never true  . Ran Out of Food in the Last Year: Never true  Transportation Needs: No Transportation Needs  . Lack of Transportation (Medical): No  . Lack of Transportation (Non-Medical): No  Physical Activity:   . Days of Exercise per Week:   . Minutes of Exercise per Session:   Stress:   . Feeling of Stress :   Social Connections:   . Frequency of Communication with Friends and Family:   . Frequency of Social Gatherings with Friends and Family:   . Attends Religious Services:   . Active Member of Clubs or Organizations:   . Attends Archivist Meetings:   Marland Kitchen Marital Status:     Review of Systems: A 12 point ROS discussed and pertinent positives are indicated in the HPI above.  All other systems are negative.  Review of Systems  Constitutional: Negative for activity change, appetite change, fatigue, fever and unexpected weight change.  HENT: Negative for tinnitus, trouble  swallowing and voice change.   Eyes: Negative for visual disturbance.  Respiratory: Negative for cough, chest tightness and shortness of breath.   Cardiovascular: Negative for chest pain.  Gastrointestinal: Negative for abdominal pain, nausea and vomiting.  Musculoskeletal: Negative for back pain and gait problem.  Neurological: Positive for dizziness. Negative for tremors, seizures, syncope, facial asymmetry, speech difficulty, weakness, light-headedness, numbness and headaches.  Psychiatric/Behavioral: Negative for behavioral problems, confusion and decreased concentration.    Vital Signs: BP (!) 167/55   Pulse 85   Temp 97.8 F (36.6 C) (Tympanic)   Resp 17   Ht 5' 1"  (1.549 m)   Wt 151 lb (68.5 kg)   SpO2 100%   BMI 28.53 kg/m   Physical Exam Vitals reviewed.  HENT:     Mouth/Throat:     Mouth: Mucous membranes are moist.  Eyes:     Extraocular Movements: Extraocular movements intact.  Cardiovascular:     Rate and Rhythm: Normal rate and regular rhythm.     Heart sounds: Normal heart sounds.  Pulmonary:     Effort: Pulmonary effort is normal.     Breath sounds: Normal breath sounds.  Abdominal:     General: There is no distension.     Palpations: Abdomen is soft.     Tenderness: There is no abdominal tenderness.  Musculoskeletal:        General: Swelling present. No tenderness or deformity. Normal range of motion.     Cervical back: Normal range of motion.     Right lower leg: No edema.     Left lower leg: No edema.  Skin:    General: Skin is warm.  Neurological:     Mental Status: She is alert and oriented to person, place, and time.  Psychiatric:        Mood and Affect: Mood normal.        Behavior: Behavior normal.        Thought Content: Thought content normal.        Judgment: Judgment normal.     Imaging: IR US Guide Vasc Access Right  Result Date: 08/21/2019 CLINICAL DATA:  History of left-sided hemiparesis due to right cerebral hemispheric  ischemic stroke. Known right MCA severe stenosis. History of dizziness and imbalance difficulties. Known to have severe left vertebrobasilar junction stenosis. EXAM: IR ULTRASOUND GUIDANCE VASC ACCESS RIGHT; BILATERAL COMMON CAROTID AND INNOMINATE ANGIOGRAPHY; IR ANGIO VERTEBRAL SEL SUBCLAVIAN INNOMINATE UNI RIGHT MOD SED; IR ANGIO VERTEBRAL SEL  VERTEBRAL UNI LEFT MOD SED COMPARISON:  Previous arteriogram of October 18, 2018. MEDICATIONS: Heparin 2000 units IA. None antibiotic was administered within 1 hour of the procedure. ANESTHESIA/SEDATION: Versed 1 mg IV; Fentanyl 25 mcg IV Moderate Sedation Time:  29 minutes The patient was continuously monitored during the procedure by the interventional radiology nurse under my direct supervision. CONTRAST:  Isovue 300 approximately 60 mL. FLUOROSCOPY TIME:  Fluoroscopy Time: 6 minutes 18 seconds (732 mGy). COMPLICATIONS: None immediate. TECHNIQUE: Informed written consent was obtained from the patient after a thorough discussion of the procedural risks, benefits and alternatives. All questions were addressed. Maximal Sterile Barrier Technique was utilized including caps, mask, sterile gowns, sterile gloves, sterile drape, hand hygiene and skin antiseptic. A timeout was performed prior to the initiation of the procedure. The right forearm to the wrist was prepped and draped in the usual sterile manner. The right radial artery was then palpated. Using ultrasound guidance morphology of the right radial artery was documented. A dorsal palmar anastomosis was verified to be present. Using ultrasound guidance and a micropuncture set, access into the right radial artery was obtained over a 0.018 inch micro guidewire. A 4/5 French radial sheath was then inserted. The obturator, and the micro guidewire were removed. Good aspiration obtained from the side port of the sheath. A cocktail of 2000 units of heparin, 2.5 mg of verapamil, and 200 mcg of nitroglycerin was then infused in  diluted form without event. A right radial arteriogram was then obtained. Over a 0.035 inch Roadrunner guidewire, a 5 [place] Simmons 2 diagnostic catheter was then advanced and selectively positioned proximally of the right vertebral artery, the right common carotid artery, the left common carotid artery, and the left vertebral artery. Following the procedure, the right radial access site was scored with a wrist band. Distal right radial pulse was verified to be present. FINDINGS: The origin of the non dominant right vertebral artery demonstrates approximately 50% stenosis. More distally the vessel meanders to the cranial skull base. A focal area of approximately 50% narrowing is seen proximal to the origin of the right posterior-inferior cerebral artery. Distal to this the opacified portion of the basilar artery, the superior cerebellar arteries, the posterior cerebral artery and the anterior-inferior cerebellar arteries demonstrates opacification into the capillary and venous phases. Unopacified blood is seen in the basilar artery from the contralateral vertebral artery. The dominant left vertebral artery is widely patent. The vessel has mild tortuosity proximally. More distally the vessel opacifies to the cranial skull base. Severe approximately 90% stenosis is seen of the left vertebrobasilar junction just distal to the origin of the left posterior-inferior cerebellar artery. More distally opacified portions of the basilar artery, the superior cerebellar arteries and the anterior-inferior cerebellar arteries is seen into the capillary and venous phases. The right common carotid arteriogram demonstrates the right external carotid artery and its major branches to be widely patent. The right internal carotid artery at the bulb demonstrates a moderate-size ulcerated plaque associated with approximately 20% stenosis by the NASCET criteria. No evidence of intraluminal filling defects is seen. More distally the vessel  is seen to opacify to the cranial skull base. A severe 80% stenosis is seen at the junction of the cervical and petrous segments of the right internal carotid artery. Distal to this the vessel is seen to opacify to the cranial skull base. The cavernous and the supraclinoid segments are widely patent. The right middle cerebral artery demonstrates approximately again a 75-80% narrowing in the mid M1  segment,. Also seen is a co- dominant anterior temporal branch of the right middle cerebral artery. The right anterior cerebral artery opacifies into the capillary and venous phases. The right posterior communicating artery is seen with moderate arteriosclerotic narrowing in its proximal aspect. The left common carotid arteriogram demonstrates an approximately 70% narrowing at the origin of the right external carotid artery associated with a partially calcified circumferential plaque. The left external carotid artery branches nevertheless are seen to opacify. The left internal carotid artery at the bulb suggests the possibility of approximately 75% narrowing with initial opacification. More distally the vessel is seen to opacify to the cranial skull base. Patency is maintained of the petrous segment with mild arteriosclerotic changes. The supraclinoid segment is also widely patent. A left posterior communicating artery is seen opacifying the left posterior cerebral artery. The left middle cerebral artery has an early branching pattern a developmental variation. The MCA branches opacify into the capillary and venous phases. The left anterior cerebral artery is seen to opacify into the capillary and venous phases as well. Incidentally the focal area of severe stenosis at the origin of the left ophthalmic artery with distal flow maintained. IMPRESSION: Approximately 75-80% stenosis of the mid M1 segment of the of the right middle cerebral artery with moderate stenosis of the proximal inferior division. Approximately 80%  stenosis at the junction of the cervical and petrous right ICA. Approximately 50% stenosis at the origin of the right vertebral artery, and 50% at the right vertebrobasilar junction. Approximately 90% stenosis of the left vertebrobasilar junction distal to the origin of the left posterior-inferior cerebellar artery. Approximately 75% narrowing of the origin of the left external carotid artery. Suspicion of high-grade possibly 75% narrowing at left internal carotid artery bulb. 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 PLAN: Findings reviewed with the patient, and her sister. Plan further management regarding above angiographic findings. Given the patient's previous history of right cerebral hemispheric ischemic stroke, initial revascularization of the right internal carotid artery petrous cervical junction would be an option. Patient advised to continue with her present medical regimen. Electronically Signed   By: Luanne Bras M.D.   On: 08/19/2019 08:43   IR ANGIO INTRA EXTRACRAN SEL COM CAROTID INNOMINATE BILAT MOD SED  Result Date: 08/21/2019 CLINICAL DATA:  History of left-sided hemiparesis due to right cerebral hemispheric ischemic stroke. Known right MCA severe stenosis. History of dizziness and imbalance difficulties. Known to have severe left vertebrobasilar junction stenosis. EXAM: IR ULTRASOUND GUIDANCE VASC ACCESS RIGHT; BILATERAL COMMON CAROTID AND INNOMINATE ANGIOGRAPHY; IR ANGIO VERTEBRAL SEL SUBCLAVIAN INNOMINATE UNI RIGHT MOD SED; IR ANGIO VERTEBRAL SEL VERTEBRAL UNI LEFT MOD SED COMPARISON:  Previous arteriogram of October 18, 2018. MEDICATIONS: Heparin 2000 units IA. None antibiotic was administered within 1 hour of the procedure. ANESTHESIA/SEDATION: Versed 1 mg IV; Fentanyl 25 mcg IV Moderate Sedation Time:  29 minutes The patient was continuously monitored during the procedure by the interventional radiology nurse under my direct supervision. CONTRAST:   Isovue 300 approximately 60 mL. FLUOROSCOPY TIME:  Fluoroscopy Time: 6 minutes 18 seconds (732 mGy). COMPLICATIONS: None immediate. TECHNIQUE: Informed written consent was obtained from the patient after a thorough discussion of the procedural risks, benefits and alternatives. All questions were addressed. Maximal Sterile Barrier Technique was utilized including caps, mask, sterile gowns, sterile gloves, sterile drape, hand hygiene and skin antiseptic. A timeout was performed prior to the initiation of the procedure. The right forearm to the wrist was prepped and  draped in the usual sterile manner. The right radial artery was then palpated. Using ultrasound guidance morphology of the right radial artery was documented. A dorsal palmar anastomosis was verified to be present. Using ultrasound guidance and a micropuncture set, access into the right radial artery was obtained over a 0.018 inch micro guidewire. A 4/5 French radial sheath was then inserted. The obturator, and the micro guidewire were removed. Good aspiration obtained from the side port of the sheath. A cocktail of 2000 units of heparin, 2.5 mg of verapamil, and 200 mcg of nitroglycerin was then infused in diluted form without event. A right radial arteriogram was then obtained. Over a 0.035 inch Roadrunner guidewire, a 5 [place] Simmons 2 diagnostic catheter was then advanced and selectively positioned proximally of the right vertebral artery, the right common carotid artery, the left common carotid artery, and the left vertebral artery. Following the procedure, the right radial access site was scored with a wrist band. Distal right radial pulse was verified to be present. FINDINGS: The origin of the non dominant right vertebral artery demonstrates approximately 50% stenosis. More distally the vessel meanders to the cranial skull base. A focal area of approximately 50% narrowing is seen proximal to the origin of the right posterior-inferior cerebral  artery. Distal to this the opacified portion of the basilar artery, the superior cerebellar arteries, the posterior cerebral artery and the anterior-inferior cerebellar arteries demonstrates opacification into the capillary and venous phases. Unopacified blood is seen in the basilar artery from the contralateral vertebral artery. The dominant left vertebral artery is widely patent. The vessel has mild tortuosity proximally. More distally the vessel opacifies to the cranial skull base. Severe approximately 90% stenosis is seen of the left vertebrobasilar junction just distal to the origin of the left posterior-inferior cerebellar artery. More distally opacified portions of the basilar artery, the superior cerebellar arteries and the anterior-inferior cerebellar arteries is seen into the capillary and venous phases. The right common carotid arteriogram demonstrates the right external carotid artery and its major branches to be widely patent. The right internal carotid artery at the bulb demonstrates a moderate-size ulcerated plaque associated with approximately 20% stenosis by the NASCET criteria. No evidence of intraluminal filling defects is seen. More distally the vessel is seen to opacify to the cranial skull base. A severe 80% stenosis is seen at the junction of the cervical and petrous segments of the right internal carotid artery. Distal to this the vessel is seen to opacify to the cranial skull base. The cavernous and the supraclinoid segments are widely patent. The right middle cerebral artery demonstrates approximately again a 75-80% narrowing in the mid M1 segment,. Also seen is a co- dominant anterior temporal branch of the right middle cerebral artery. The right anterior cerebral artery opacifies into the capillary and venous phases. The right posterior communicating artery is seen with moderate arteriosclerotic narrowing in its proximal aspect. The left common carotid arteriogram demonstrates an  approximately 70% narrowing at the origin of the right external carotid artery associated with a partially calcified circumferential plaque. The left external carotid artery branches nevertheless are seen to opacify. The left internal carotid artery at the bulb suggests the possibility of approximately 75% narrowing with initial opacification. More distally the vessel is seen to opacify to the cranial skull base. Patency is maintained of the petrous segment with mild arteriosclerotic changes. The supraclinoid segment is also widely patent. A left posterior communicating artery is seen opacifying the left posterior cerebral artery. The left middle  cerebral artery has an early branching pattern a developmental variation. The MCA branches opacify into the capillary and venous phases. The left anterior cerebral artery is seen to opacify into the capillary and venous phases as well. Incidentally the focal area of severe stenosis at the origin of the left ophthalmic artery with distal flow maintained. IMPRESSION: Approximately 75-80% stenosis of the mid M1 segment of the of the right middle cerebral artery with moderate stenosis of the proximal inferior division. Approximately 80% stenosis at the junction of the cervical and petrous right ICA. Approximately 50% stenosis at the origin of the right vertebral artery, and 50% at the right vertebrobasilar junction. Approximately 90% stenosis of the left vertebrobasilar junction distal to the origin of the left posterior-inferior cerebellar artery. Approximately 75% narrowing of the origin of the left external carotid artery. Suspicion of high-grade possibly 75% narrowing at left internal carotid artery bulb. 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 PLAN: Findings reviewed with the patient, and her sister. Plan further management regarding above angiographic findings. Given the patient's previous history of right cerebral hemispheric ischemic  stroke, initial revascularization of the right internal carotid artery petrous cervical junction would be an option. Patient advised to continue with her present medical regimen. Electronically Signed   By: Luanne Bras M.D.   On: 08/19/2019 08:43   IR ANGIO VERTEBRAL SEL SUBCLAVIAN INNOMINATE UNI R MOD SED  Result Date: 08/21/2019 CLINICAL DATA:  History of left-sided hemiparesis due to right cerebral hemispheric ischemic stroke. Known right MCA severe stenosis. History of dizziness and imbalance difficulties. Known to have severe left vertebrobasilar junction stenosis. EXAM: IR ULTRASOUND GUIDANCE VASC ACCESS RIGHT; BILATERAL COMMON CAROTID AND INNOMINATE ANGIOGRAPHY; IR ANGIO VERTEBRAL SEL SUBCLAVIAN INNOMINATE UNI RIGHT MOD SED; IR ANGIO VERTEBRAL SEL VERTEBRAL UNI LEFT MOD SED COMPARISON:  Previous arteriogram of October 18, 2018. MEDICATIONS: Heparin 2000 units IA. None antibiotic was administered within 1 hour of the procedure. ANESTHESIA/SEDATION: Versed 1 mg IV; Fentanyl 25 mcg IV Moderate Sedation Time:  29 minutes The patient was continuously monitored during the procedure by the interventional radiology nurse under my direct supervision. CONTRAST:  Isovue 300 approximately 60 mL. FLUOROSCOPY TIME:  Fluoroscopy Time: 6 minutes 18 seconds (732 mGy). COMPLICATIONS: None immediate. TECHNIQUE: Informed written consent was obtained from the patient after a thorough discussion of the procedural risks, benefits and alternatives. All questions were addressed. Maximal Sterile Barrier Technique was utilized including caps, mask, sterile gowns, sterile gloves, sterile drape, hand hygiene and skin antiseptic. A timeout was performed prior to the initiation of the procedure. The right forearm to the wrist was prepped and draped in the usual sterile manner. The right radial artery was then palpated. Using ultrasound guidance morphology of the right radial artery was documented. A dorsal palmar anastomosis was  verified to be present. Using ultrasound guidance and a micropuncture set, access into the right radial artery was obtained over a 0.018 inch micro guidewire. A 4/5 French radial sheath was then inserted. The obturator, and the micro guidewire were removed. Good aspiration obtained from the side port of the sheath. A cocktail of 2000 units of heparin, 2.5 mg of verapamil, and 200 mcg of nitroglycerin was then infused in diluted form without event. A right radial arteriogram was then obtained. Over a 0.035 inch Roadrunner guidewire, a 5 [place] Simmons 2 diagnostic catheter was then advanced and selectively positioned proximally of the right vertebral artery, the right common carotid artery, the left common carotid artery,  and the left vertebral artery. Following the procedure, the right radial access site was scored with a wrist band. Distal right radial pulse was verified to be present. FINDINGS: The origin of the non dominant right vertebral artery demonstrates approximately 50% stenosis. More distally the vessel meanders to the cranial skull base. A focal area of approximately 50% narrowing is seen proximal to the origin of the right posterior-inferior cerebral artery. Distal to this the opacified portion of the basilar artery, the superior cerebellar arteries, the posterior cerebral artery and the anterior-inferior cerebellar arteries demonstrates opacification into the capillary and venous phases. Unopacified blood is seen in the basilar artery from the contralateral vertebral artery. The dominant left vertebral artery is widely patent. The vessel has mild tortuosity proximally. More distally the vessel opacifies to the cranial skull base. Severe approximately 90% stenosis is seen of the left vertebrobasilar junction just distal to the origin of the left posterior-inferior cerebellar artery. More distally opacified portions of the basilar artery, the superior cerebellar arteries and the anterior-inferior  cerebellar arteries is seen into the capillary and venous phases. The right common carotid arteriogram demonstrates the right external carotid artery and its major branches to be widely patent. The right internal carotid artery at the bulb demonstrates a moderate-size ulcerated plaque associated with approximately 20% stenosis by the NASCET criteria. No evidence of intraluminal filling defects is seen. More distally the vessel is seen to opacify to the cranial skull base. A severe 80% stenosis is seen at the junction of the cervical and petrous segments of the right internal carotid artery. Distal to this the vessel is seen to opacify to the cranial skull base. The cavernous and the supraclinoid segments are widely patent. The right middle cerebral artery demonstrates approximately again a 75-80% narrowing in the mid M1 segment,. Also seen is a co- dominant anterior temporal branch of the right middle cerebral artery. The right anterior cerebral artery opacifies into the capillary and venous phases. The right posterior communicating artery is seen with moderate arteriosclerotic narrowing in its proximal aspect. The left common carotid arteriogram demonstrates an approximately 70% narrowing at the origin of the right external carotid artery associated with a partially calcified circumferential plaque. The left external carotid artery branches nevertheless are seen to opacify. The left internal carotid artery at the bulb suggests the possibility of approximately 75% narrowing with initial opacification. More distally the vessel is seen to opacify to the cranial skull base. Patency is maintained of the petrous segment with mild arteriosclerotic changes. The supraclinoid segment is also widely patent. A left posterior communicating artery is seen opacifying the left posterior cerebral artery. The left middle cerebral artery has an early branching pattern a developmental variation. The MCA branches opacify into the  capillary and venous phases. The left anterior cerebral artery is seen to opacify into the capillary and venous phases as well. Incidentally the focal area of severe stenosis at the origin of the left ophthalmic artery with distal flow maintained. IMPRESSION: Approximately 75-80% stenosis of the mid M1 segment of the of the right middle cerebral artery with moderate stenosis of the proximal inferior division. Approximately 80% stenosis at the junction of the cervical and petrous right ICA. Approximately 50% stenosis at the origin of the right vertebral artery, and 50% at the right vertebrobasilar junction. Approximately 90% stenosis of the left vertebrobasilar junction distal to the origin of the left posterior-inferior cerebellar artery. Approximately 75% narrowing of the origin of the left external carotid artery. Suspicion of high-grade possibly  75% narrowing at left internal carotid artery bulb. 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 PLAN: Findings reviewed with the patient, and her sister. Plan further management regarding above angiographic findings. Given the patient's previous history of right cerebral hemispheric ischemic stroke, initial revascularization of the right internal carotid artery petrous cervical junction would be an option. Patient advised to continue with her present medical regimen. Electronically Signed   By: Luanne Bras M.D.   On: 08/19/2019 08:43   IR ANGIO VERTEBRAL SEL VERTEBRAL UNI L MOD SED  Result Date: 08/21/2019 CLINICAL DATA:  History of left-sided hemiparesis due to right cerebral hemispheric ischemic stroke. Known right MCA severe stenosis. History of dizziness and imbalance difficulties. Known to have severe left vertebrobasilar junction stenosis. EXAM: IR ULTRASOUND GUIDANCE VASC ACCESS RIGHT; BILATERAL COMMON CAROTID AND INNOMINATE ANGIOGRAPHY; IR ANGIO VERTEBRAL SEL SUBCLAVIAN INNOMINATE UNI RIGHT MOD SED; IR ANGIO VERTEBRAL SEL  VERTEBRAL UNI LEFT MOD SED COMPARISON:  Previous arteriogram of October 18, 2018. MEDICATIONS: Heparin 2000 units IA. None antibiotic was administered within 1 hour of the procedure. ANESTHESIA/SEDATION: Versed 1 mg IV; Fentanyl 25 mcg IV Moderate Sedation Time:  29 minutes The patient was continuously monitored during the procedure by the interventional radiology nurse under my direct supervision. CONTRAST:  Isovue 300 approximately 60 mL. FLUOROSCOPY TIME:  Fluoroscopy Time: 6 minutes 18 seconds (732 mGy). COMPLICATIONS: None immediate. TECHNIQUE: Informed written consent was obtained from the patient after a thorough discussion of the procedural risks, benefits and alternatives. All questions were addressed. Maximal Sterile Barrier Technique was utilized including caps, mask, sterile gowns, sterile gloves, sterile drape, hand hygiene and skin antiseptic. A timeout was performed prior to the initiation of the procedure. The right forearm to the wrist was prepped and draped in the usual sterile manner. The right radial artery was then palpated. Using ultrasound guidance morphology of the right radial artery was documented. A dorsal palmar anastomosis was verified to be present. Using ultrasound guidance and a micropuncture set, access into the right radial artery was obtained over a 0.018 inch micro guidewire. A 4/5 French radial sheath was then inserted. The obturator, and the micro guidewire were removed. Good aspiration obtained from the side port of the sheath. A cocktail of 2000 units of heparin, 2.5 mg of verapamil, and 200 mcg of nitroglycerin was then infused in diluted form without event. A right radial arteriogram was then obtained. Over a 0.035 inch Roadrunner guidewire, a 5 [place] Simmons 2 diagnostic catheter was then advanced and selectively positioned proximally of the right vertebral artery, the right common carotid artery, the left common carotid artery, and the left vertebral artery. Following the  procedure, the right radial access site was scored with a wrist band. Distal right radial pulse was verified to be present. FINDINGS: The origin of the non dominant right vertebral artery demonstrates approximately 50% stenosis. More distally the vessel meanders to the cranial skull base. A focal area of approximately 50% narrowing is seen proximal to the origin of the right posterior-inferior cerebral artery. Distal to this the opacified portion of the basilar artery, the superior cerebellar arteries, the posterior cerebral artery and the anterior-inferior cerebellar arteries demonstrates opacification into the capillary and venous phases. Unopacified blood is seen in the basilar artery from the contralateral vertebral artery. The dominant left vertebral artery is widely patent. The vessel has mild tortuosity proximally. More distally the vessel opacifies to the cranial skull base. Severe approximately 90% stenosis is seen of the  left vertebrobasilar junction just distal to the origin of the left posterior-inferior cerebellar artery. More distally opacified portions of the basilar artery, the superior cerebellar arteries and the anterior-inferior cerebellar arteries is seen into the capillary and venous phases. The right common carotid arteriogram demonstrates the right external carotid artery and its major branches to be widely patent. The right internal carotid artery at the bulb demonstrates a moderate-size ulcerated plaque associated with approximately 20% stenosis by the NASCET criteria. No evidence of intraluminal filling defects is seen. More distally the vessel is seen to opacify to the cranial skull base. A severe 80% stenosis is seen at the junction of the cervical and petrous segments of the right internal carotid artery. Distal to this the vessel is seen to opacify to the cranial skull base. The cavernous and the supraclinoid segments are widely patent. The right middle cerebral artery demonstrates  approximately again a 75-80% narrowing in the mid M1 segment,. Also seen is a co- dominant anterior temporal branch of the right middle cerebral artery. The right anterior cerebral artery opacifies into the capillary and venous phases. The right posterior communicating artery is seen with moderate arteriosclerotic narrowing in its proximal aspect. The left common carotid arteriogram demonstrates an approximately 70% narrowing at the origin of the right external carotid artery associated with a partially calcified circumferential plaque. The left external carotid artery branches nevertheless are seen to opacify. The left internal carotid artery at the bulb suggests the possibility of approximately 75% narrowing with initial opacification. More distally the vessel is seen to opacify to the cranial skull base. Patency is maintained of the petrous segment with mild arteriosclerotic changes. The supraclinoid segment is also widely patent. A left posterior communicating artery is seen opacifying the left posterior cerebral artery. The left middle cerebral artery has an early branching pattern a developmental variation. The MCA branches opacify into the capillary and venous phases. The left anterior cerebral artery is seen to opacify into the capillary and venous phases as well. Incidentally the focal area of severe stenosis at the origin of the left ophthalmic artery with distal flow maintained. IMPRESSION: Approximately 75-80% stenosis of the mid M1 segment of the of the right middle cerebral artery with moderate stenosis of the proximal inferior division. Approximately 80% stenosis at the junction of the cervical and petrous right ICA. Approximately 50% stenosis at the origin of the right vertebral artery, and 50% at the right vertebrobasilar junction. Approximately 90% stenosis of the left vertebrobasilar junction distal to the origin of the left posterior-inferior cerebellar artery. Approximately 75% narrowing of the  origin of the left external carotid artery. Suspicion of high-grade possibly 75% narrowing at left internal carotid artery bulb. 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 PLAN: Findings reviewed with the patient, and her sister. Plan further management regarding above angiographic findings. Given the patient's previous history of right cerebral hemispheric ischemic stroke, initial revascularization of the right internal carotid artery petrous cervical junction would be an option. Patient advised to continue with her present medical regimen. Electronically Signed   By: Luanne Bras M.D.   On: 08/19/2019 08:43    Labs:  CBC: Recent Labs    11/27/18 0728 11/29/18 2043 08/18/19 0705 08/25/19 0715  WBC 5.5 4.8 5.0 4.8  HGB 13.7 13.5 12.5 13.3  HCT 41.9 42.2 39.3 42.2  PLT 239 290 249 283    COAGS: Recent Labs    10/18/18 1034 11/27/18 0728 08/18/19 0705  INR 1.0 0.9  0.9    BMP: Recent Labs    11/29/18 2043 03/31/19 1000 05/22/19 1312 08/18/19 0705  NA 136 136 141 136  K 3.8 4.3 4.4 3.6  CL 100 97 99 101  CO2 25 23 29 25   GLUCOSE 237* 412* 131* 211*  BUN 18 21 18  26*  CALCIUM 9.8 10.0 10.2 9.7  CREATININE 0.98 1.04* 0.98 0.93  GFRNONAA 60* 56* 60 >60  GFRAA >60 64 69 >60    LIVER FUNCTION TESTS: Recent Labs    10/07/18 1259 05/22/19 1312  BILITOT 0.3 0.3  AST 17 15  ALT 20 19  ALKPHOS 73 74  PROT 7.3 7.5  ALBUMIN 4.1 4.0    TUMOR MARKERS: No results for input(s): AFPTM, CEA, CA199, CHROMGRNA in the last 8760 hours.  Assessment and Plan:  Scheduled today for cerebral arteriogram with right internal carotid artery angioplasty/stent placement in IR Risks and benefits of cerebral angiogram with intervention were discussed with the patient including, but not limited to bleeding, infection, vascular injury, contrast induced renal failure, stroke or even death.  This interventional procedure involves the use of X-rays and  because of the nature of the planned procedure, it is possible that we will have prolonged use of X-ray fluoroscopy.  Potential radiation risks to you include (but are not limited to) the following: - A slightly elevated risk for cancer  several years later in life. This risk is typically less than 0.5% percent. This risk is low in comparison to the normal incidence of human cancer, which is 33% for women and 50% for men according to the [place]. - Radiation induced injury can include skin redness, resembling a rash, tissue breakdown / ulcers and hair loss (which can be temporary or permanent).   The likelihood of either of these occurring depends on the difficulty of the procedure and whether you are sensitive to radiation due to previous procedures, disease, or genetic conditions.   IF your procedure requires a prolonged use of radiation, you will be notified and given written instructions for further action.  It is your responsibility to monitor the irradiated area for the 2 weeks following the procedure and to notify your physician if you are concerned that you have suffered a radiation induced injury.    All of the patient's questions were answered, patient is agreeable to proceed.  Consent signed and in chart.  Pt is aware if intervention is performed-- she will be admitted overnight into Neuro ICU. Plan for discharge in am if stable. She is agreeable to proceed She is Jehovah Witness-- accepts NO blood procducts  Thank you for this interesting consult.  I greatly enjoyed meeting [employer] and look forward to participating in their care.  A copy of this report was sent to the requesting provider on this date.  Electronically Signed: Lavonia Drafts, PA-C 08/25/2019, 7:37 AM   I spent a total of    25 Minutes in face to face in clinical consultation, greater than 50% of which was counseling/coordinating care for R ICA intervention

## 2019-08-25 NOTE — Sedation Documentation (Signed)
TR band applied by Dr. Corliss Skains to right radial/wrist. 14cc of air applied to band @ 1200pm.

## 2019-08-25 NOTE — Sedation Documentation (Signed)
Right femoral sheath removed. Angio-seal closure device used.

## 2019-08-25 NOTE — Progress Notes (Signed)
Updated Dr. Corliss Skains about patient's status. Patient neuro intact; follows commands, moves all extremities, fully oriented. No headache, no N/V. No bleeding from radial or groin sites. Heparin level was elevated, pharmacy had me turn down heparin rate. Updated Dr. Corliss Skains about patient's blood pressure being labile, by both the cuff and the arterial line. Arterial line is very positional, often being below or above systolic range of 120-140 . MD made aware that at time of phone call with MD that patient's systolic blood pressure via arterial line was in the 160s. This RN requested a PRN blood pressure medication if needed in order to keep cleviprex drip off if possible. No new orders received. MD stated if patient is asymptomatic, let's leave things as they are.

## 2019-08-25 NOTE — Procedures (Signed)
S/P RT  ICA cer/petrous junction balloon angioplasty for severe stenosis. Residual stenosis of approx 25 %.. Severe stenosis of RT MCA M 1 seg with improved flow post angioplasty. Patient extubated. Maintaining airway. Denies any H/As,N/V . Oriented to place. Pupils 75mm RT = LT No facial asymmetry. Tongue midloine. Moves all 4 ?weaker on the left(present before the procedure). RT groin soft. Distal pulses deplorable DPs and PTs. S.Tucker Steedley MD

## 2019-08-25 NOTE — Progress Notes (Signed)
ANTICOAGULATION CONSULT NOTE - Initial Consult  Pharmacy Consult for heparin Indication: Post neuro IR procedure  No Known Allergies  Patient Measurements: Height: 5' 1"  (154.9 cm) Weight: 68.5 kg (151 lb) IBW/kg (Calculated) : 47.8 Heparin Dosing Weight: 61  Vital Signs: Temp: 96 F (35.6 C) (06/14 1245) Temp Source: Tympanic (06/14 0705) BP: 123/64 (06/14 1305) Pulse Rate: 69 (06/14 1305)  Labs: Recent Labs    08/25/19 0715  HGB 13.3  HCT 42.2  PLT 283    Estimated Creatinine Clearance: 51.3 mL/min (by C-G formula based on SCr of 0.93 mg/dL).   Medical History: Past Medical History:  Diagnosis Date  . Anemia    low iron - not since having fibroids removed  . Arthritis   . Diabetes mellitus without complication ([place])    type   . Hyperlipidemia   . Hypertension   . Sleep apnea    just recently diagnosed with it, has not gotten the Cpap (done 10/29/18)  . Stroke [facility])    weakness on left side  . Type II diabetes mellitus ([facility])     Medications:  Medications Prior to Admission  Medication Sig Dispense Refill Last Dose  . amLODipine (NORVASC) 10 MG tablet Take 1 tablet (10 mg total) by mouth daily. 90 tablet 0 08/25/2019 at Unknown time  . aspirin EC 81 MG tablet Take 81 mg by mouth every evening.   08/25/2019 at Unknown time  . Calcium Carbonate-Vitamin D (CALCIUM-D PO) Take 2 tablets by mouth daily at 12 noon.    08/24/2019 at Unknown time  . carvedilol (COREG) 6.25 MG tablet TAKE 1 TABLET(6.25 MG) BY MOUTH TWICE DAILY (Patient taking differently: Take 6.25 mg by mouth 2 (two) times daily with a meal. ) 180 tablet 0 08/25/2019 at 0600  . hydrALAZINE (APRESOLINE) 50 MG tablet Take 1 tablet (50 mg total) by mouth 2 (two) times daily. 180 tablet 0 08/25/2019 at Unknown time  . Insulin Degludec-Liraglutide (XULTOPHY) 100-3.6 UNIT-MG/ML SOPN Inject 30 Units into the skin daily. 5 pen 2 08/24/2019 at Unknown time  . telmisartan-hydrochlorothiazide (MICARDIS HCT) 40-12.5 MG  tablet Take 1 tablet by mouth daily. 90 tablet 0 08/24/2019 at Unknown time  . ticagrelor (BRILINTA) 90 MG TABS tablet Take 1 tablet (90 mg total) by mouth 2 (two) times daily. 180 tablet 1 08/25/2019 at Unknown time  . vitamin C (ASCORBIC ACID) 250 MG tablet Take 500 mg by mouth daily at 12 noon.   Past Week at Unknown time  . blood glucose meter kit and supplies KIT Dispense based on patient and insurance preference. Use up to four times daily as directed. (FOR ICD-9 250.00, 250.01). 1 each 0   . Blood Glucose Monitoring Suppl (TRUE METRIX METER) w/Device KIT 1 each by Does not apply route in the morning, at noon, in the evening, and at bedtime. 1 kit 0   . Evolocumab (REPATHA SURECLICK) 053 MG/ML SOAJ Inject 140 mg into the skin every 14 (fourteen) days. 2 pen 11   . glucose blood (TRUE METRIX BLOOD GLUCOSE TEST) test strip Check blood sugar 4 times a day before meals and bedtime 300 each 4   . Insulin Glargine-Lixisenatide (SOLIQUA) 100-33 UNT-MCG/ML SOPN Inject 25 Units into the skin daily. (Patient taking differently: Inject 30 Units into the skin daily. ) 3 mL 1   . Multiple Vitamin (MULTIVITAMIN PO) Take 1 tablet by mouth daily at 12 noon.    08/23/2019  . Omega-3 Fatty Acids (OMEGA-3 PLUS PO) Take 2 capsules  by mouth daily at 12 noon.   08/23/2019    Assessment: 22 YOF with high-grade R MCA stenosis s/p balloon angioplasty. IV heparin to run until sheath removal in AM tomorrow. Currently on IV heparin at 500 units/hr. CBC and SCr wnl.   Goal of Therapy:  Heparin level 0.1-0.25 units/ml Monitor platelets by anticoagulation protocol: Yes   Plan:  -Continue IV heparin at 500 units/hr  -F/u 6 hr HL at 8 PM today  -Heparin to stop at 8 AM tomorrow   Albertina Parr, PharmD., BCPS, BCCCP Clinical Pharmacist Clinical phone for 08/25/19 until 3:30pm: [phone] If after 3:30pm, please refer to [facility] for unit-specific pharmacist

## 2019-08-25 NOTE — Transfer of Care (Addendum)
Immediate Anesthesia Transfer of Care Note  Patient: Donna [name]  Procedure(s) Performed: STENTING (N/A )  Patient Location: PACU  Anesthesia Type:General  Level of Consciousness: awake, alert  and oriented  Airway & Oxygen Therapy: Patient Spontanous Breathing and Patient connected to face mask oxygen  Post-op Assessment: Report given to RN and Post -op Vital signs reviewed and stable  Post vital signs: Reviewed and stable  Last Vitals:  Vitals Value Taken Time  BP 149/63 08/25/19 1235  Temp    Pulse 70 08/25/19 1249  Resp 17 08/25/19 1249  SpO2 100 % 08/25/19 1249  Vitals shown include unvalidated device data.  Last Pain:  Vitals:   08/25/19 0739  TempSrc:   PainSc: 0-No pain      Patients Stated Pain Goal: 5 (08/25/19 0739)  Complications: No complications documented. PACU nurse requested CRNA to start a new IV for ordered heparin infusion on arrival to PACU

## 2019-08-25 NOTE — Progress Notes (Signed)
NIR.  Patient with history of right ICA cervical/petrous junction stenosis s/p revascularization using balloon angioplasty via right radial and right femoral approach today by Dr. Corliss Skains.  Went to evaluate patient bedside alongside Dr. Corliss Skains in neuro ICU. Patient awake and alert laying in bed with no complaints at this time. Denies headache, weakness, numbness/tingling, dizziness, vision changes, hearing changes, tinnitus, or speech difficulty.  Alert, awake, and oriented x3. Speech and comprehension intact. PERRL bilaterally. No facial asymmetry. Tongue midline. Motor power symmetric proportional to effort. No pronator drift. Fine motor and coordination intact. Distal pulses- DPs 1+ bilaterally; radials 2+ bilaterally. Right groin puncture site soft without active bleeding or hematoma. Right radial puncture site soft without active bleeding or hematoma.  Plan to stay in neuro ICU for overnight observation. Sliding scale for glycemic management. P2Y12 tomorrow AM if tubes available. Continue taking Brilinta 90 mg twice daily and Aspirin 81 mg once daily. Advance diet as tolerated. NIR to follow.   Waylan Boga Gennavieve Huq, PA-C 08/25/2019, 3:36 PM

## 2019-08-25 NOTE — Anesthesia Procedure Notes (Signed)
Procedure Name: Intubation Date/Time: 08/25/2019 10:24 AM Performed by: Trinna Post., CRNA Pre-anesthesia Checklist: Patient identified, Emergency Drugs available, Suction available, Patient being monitored and Timeout performed Patient Re-evaluated:Patient Re-evaluated prior to induction Oxygen Delivery Method: Circle system utilized Preoxygenation: Pre-oxygenation with 100% oxygen Induction Type: IV induction Ventilation: Mask ventilation without difficulty Laryngoscope Size: Glidescope and 3 Grade View: Grade I Tube type: Oral Tube size: 7.0 mm Number of attempts: 2 Airway Equipment and Method: Stylet and Video-laryngoscopy Placement Confirmation: ETT inserted through vocal cords under direct vision,  positive ETCO2 and breath sounds checked- equal and bilateral Secured at: 21 cm Tube secured with: Tape Dental Injury: Teeth and Oropharynx as per pre-operative assessment  Comments: Attempt x1 with MAC 3 with only view of epiglottis due to prominent upper teeth. Glidepro Go size 3 with successful intubation

## 2019-08-25 NOTE — Anesthesia Procedure Notes (Signed)
Procedure Name: MAC Date/Time: 08/25/2019 8:50 AM Performed by: Trinna Post., CRNA Pre-anesthesia Checklist: Patient identified, Emergency Drugs available, Suction available, Patient being monitored and Timeout performed Patient Re-evaluated:Patient Re-evaluated prior to induction Oxygen Delivery Method: Simple face mask Preoxygenation: Pre-oxygenation with 100% oxygen Induction Type: IV induction Placement Confirmation: positive ETCO2

## 2019-08-25 NOTE — Progress Notes (Addendum)
ANTICOAGULATION CONSULT NOTE - Follow Up Consult  Pharmacy Consult for Heparin Indication: Post neuro IR procedure  No Known Allergies  Patient Measurements: Height: 5\' 1"  (154.9 cm) Weight: 68.5 kg (151 lb) IBW/kg (Calculated) : 47.8 Heparin Dosing Weight: 61 kg  Vital Signs: Temp: 98.5 F (36.9 C) (06/14 1405) BP: 94/49 (06/14 2000) Pulse Rate: 74 (06/14 2000)  Labs: Recent Labs    08/25/19 0715 08/25/19 1928  HGB 13.3  --   HCT 42.2  --   PLT 283  --   HEPARINUNFRC  --  0.76*    Estimated Creatinine Clearance: 51.3 mL/min (by C-G formula based on SCr of 0.93 mg/dL).   Medical History: Past Medical History:  Diagnosis Date  . Anemia    low iron - not since having fibroids removed  . Arthritis   . Diabetes mellitus without complication (HCC)    type   . Hyperlipidemia   . Hypertension   . Sleep apnea    just recently diagnosed with it, has not gotten the Cpap (done 10/29/18)  . Stroke [facility])    weakness on left side  . Type II diabetes mellitus [facility])     Assessment: 69 yr old female with high-grade R MCA stenosis, s/p balloon angioplasty. IV heparin to run until sheath removal in AM tomorrow.  Heparin level ~5.5 hrs after starting heparin infusion at 500 units/hr is 0.76 units/ml, which is above the goal range for this pt. Per RN, no issues with bleeding post procedure. RN said that this heparin level was drawn off left arterial line (heparin infusing in right AC). Advised RN to hold heparin infusion and redraw stat HL from left arterial line; repeat heparin level was 0.60 units/ml, which remains above the goal range for this pt. No issues with IV or bleeding observed.  Goal of Therapy:  Heparin level 0.1-0.25 units/ml Monitor platelets by anticoagulation protocol: Yes   Plan:  Hold heparin infusion X 1 hr, then restart heparin infusion at 200 units/hr Check 6-hr heparin level Monitor daily heparin level, CBC while on heparin Heparin infusion to stop at 8  AM tomorrow   05-03-1973, PharmD, BCPS, [facility] Clinical Pharmacist 08/25/19, 20:25 PM

## 2019-08-26 ENCOUNTER — Other Ambulatory Visit: Payer: Medicare HMO | Admitting: Orthotics

## 2019-08-26 ENCOUNTER — Encounter (HOSPITAL_COMMUNITY): Payer: Self-pay | Admitting: Interventional Radiology

## 2019-08-26 ENCOUNTER — Inpatient Hospital Stay (HOSPITAL_COMMUNITY): Payer: Medicare HMO

## 2019-08-26 ENCOUNTER — Ambulatory Visit: Payer: Self-pay

## 2019-08-26 ENCOUNTER — Telehealth: Payer: Medicare HMO

## 2019-08-26 DIAGNOSIS — I10 Essential (primary) hypertension: Secondary | ICD-10-CM

## 2019-08-26 DIAGNOSIS — N179 Acute kidney failure, unspecified: Secondary | ICD-10-CM | POA: Diagnosis present

## 2019-08-26 DIAGNOSIS — E11319 Type 2 diabetes mellitus with unspecified diabetic retinopathy without macular edema: Secondary | ICD-10-CM

## 2019-08-26 DIAGNOSIS — Z794 Long term (current) use of insulin: Secondary | ICD-10-CM

## 2019-08-26 DIAGNOSIS — R58 Hemorrhage, not elsewhere classified: Secondary | ICD-10-CM

## 2019-08-26 DIAGNOSIS — E785 Hyperlipidemia, unspecified: Secondary | ICD-10-CM

## 2019-08-26 DIAGNOSIS — M7981 Nontraumatic hematoma of soft tissue: Secondary | ICD-10-CM

## 2019-08-26 DIAGNOSIS — D649 Anemia, unspecified: Secondary | ICD-10-CM | POA: Diagnosis not present

## 2019-08-26 LAB — CBC WITH DIFFERENTIAL/PLATELET
Abs Immature Granulocytes: 0.06 10*3/uL (ref 0.00–0.07)
Abs Immature Granulocytes: 0.07 10*3/uL (ref 0.00–0.07)
Basophils Absolute: 0 10*3/uL (ref 0.0–0.1)
Basophils Absolute: 0 10*3/uL (ref 0.0–0.1)
Basophils Relative: 0 %
Basophils Relative: 0 %
Eosinophils Absolute: 0 10*3/uL (ref 0.0–0.5)
Eosinophils Absolute: 0 10*3/uL (ref 0.0–0.5)
Eosinophils Relative: 0 %
Eosinophils Relative: 0 %
HCT: 20.4 % — ABNORMAL LOW (ref 36.0–46.0)
HCT: 20.4 % — ABNORMAL LOW (ref 36.0–46.0)
Hemoglobin: 6.9 g/dL — CL (ref 12.0–15.0)
Hemoglobin: 7 g/dL — ABNORMAL LOW (ref 12.0–15.0)
Immature Granulocytes: 1 %
Immature Granulocytes: 1 %
Lymphocytes Relative: 15 %
Lymphocytes Relative: 17 %
Lymphs Abs: 1.7 10*3/uL (ref 0.7–4.0)
Lymphs Abs: 2.1 10*3/uL (ref 0.7–4.0)
MCH: 30.8 pg (ref 26.0–34.0)
MCH: 30.8 pg (ref 26.0–34.0)
MCHC: 33.8 g/dL (ref 30.0–36.0)
MCHC: 34.3 g/dL (ref 30.0–36.0)
MCV: 91.1 fL (ref 80.0–100.0)
MCV: 89.9 fL (ref 80.0–100.0)
Monocytes Absolute: 0.8 10*3/uL (ref 0.1–1.0)
Monocytes Absolute: 0.7 10*3/uL (ref 0.1–1.0)
Monocytes Relative: 7 %
Monocytes Relative: 6 %
Neutro Abs: 8.9 10*3/uL — ABNORMAL HIGH (ref 1.7–7.7)
Neutro Abs: 8.9 10*3/uL — ABNORMAL HIGH (ref 1.7–7.7)
Neutrophils Relative %: 77 %
Neutrophils Relative %: 76 %
Platelets: 206 10*3/uL (ref 150–400)
Platelets: 223 10*3/uL (ref 150–400)
RBC: 2.24 MIL/uL — ABNORMAL LOW (ref 3.87–5.11)
RBC: 2.27 MIL/uL — ABNORMAL LOW (ref 3.87–5.11)
RDW: 12.9 % (ref 11.5–15.5)
RDW: 12.9 % (ref 11.5–15.5)
WBC: 11.4 10*3/uL — ABNORMAL HIGH (ref 4.0–10.5)
WBC: 11.8 10*3/uL — ABNORMAL HIGH (ref 4.0–10.5)
nRBC: 0 % (ref 0.0–0.2)
nRBC: 0 % (ref 0.0–0.2)

## 2019-08-26 LAB — BASIC METABOLIC PANEL
Anion gap: 14 (ref 5–15)
BUN: 31 mg/dL — ABNORMAL HIGH (ref 8–23)
CO2: 17 mmol/L — ABNORMAL LOW (ref 22–32)
Calcium: 8 mg/dL — ABNORMAL LOW (ref 8.9–10.3)
Chloride: 103 mmol/L (ref 98–111)
Creatinine, Ser: 1.78 mg/dL — ABNORMAL HIGH (ref 0.44–1.00)
GFR calc Af Amer: 33 mL/min — ABNORMAL LOW (ref >60)
GFR calc non Af Amer: 29 mL/min — ABNORMAL LOW (ref >60)
Glucose, Bld: 289 mg/dL — ABNORMAL HIGH (ref 70–99)
Potassium: 4.5 mmol/L (ref 3.5–5.1)
Sodium: 134 mmol/L — ABNORMAL LOW (ref 135–145)

## 2019-08-26 LAB — GLUCOSE, CAPILLARY
Glucose-Capillary: 277 mg/dL — ABNORMAL HIGH (ref 70–99)
Glucose-Capillary: 213 mg/dL — ABNORMAL HIGH (ref 70–99)
Glucose-Capillary: 235 mg/dL — ABNORMAL HIGH (ref 70–99)
Glucose-Capillary: 140 mg/dL — ABNORMAL HIGH (ref 70–99)

## 2019-08-26 LAB — PROTIME-INR
INR: 1.1 (ref 0.8–1.2)
Prothrombin Time: 13.6 seconds (ref 11.4–15.2)

## 2019-08-26 LAB — PLATELET INHIBITION P2Y12: Platelet Function  P2Y12: 164 [PRU] — ABNORMAL LOW (ref 182–335)

## 2019-08-26 LAB — HEPARIN LEVEL (UNFRACTIONATED): Heparin Unfractionated: <0.1 IU/mL — ABNORMAL LOW (ref 0.30–0.70)

## 2019-08-26 MED ORDER — SODIUM CHLORIDE 0.9% IV SOLUTION: Freq: Once | INTRAVENOUS | Status: DC

## 2019-08-26 MED ORDER — SODIUM CHLORIDE 0.9 % IV BOLUS
500.0000 mL | Freq: Once | INTRAVENOUS | Status: AC
  Administered 2019-08-26: 500 mL via INTRAVENOUS

## 2019-08-26 MED ORDER — SODIUM CHLORIDE 0.9 % IV BOLUS
1000.0000 mL | Freq: Once | INTRAVENOUS | Status: AC
  Administered 2019-08-27: 1000 mL via INTRAVENOUS

## 2019-08-26 MED ORDER — DOCUSATE SODIUM 100 MG PO CAPS: 100.0000 mg | ORAL_CAPSULE | Freq: Two times a day (BID) | ORAL | Status: DC

## 2019-08-26 MED ORDER — PHENYLEPHRINE HCL-NACL 10-0.9 MG/250ML-% IV SOLN
25.0000 ug/min | INTRAVENOUS | Status: DC
  Administered 2019-08-26: 140 ug/min via INTRAVENOUS
  Administered 2019-08-26: 150 ug/min via INTRAVENOUS
  Administered 2019-08-26: 155 ug/min via INTRAVENOUS
  Administered 2019-08-26: 100 ug/min via INTRAVENOUS
  Administered 2019-08-27 (×2): 160 ug/min via INTRAVENOUS
  Administered 2019-08-27: 150 ug/min via INTRAVENOUS
  Administered 2019-08-27: 200 ug/min via INTRAVENOUS
  Filled 2019-08-26: qty 500
  Filled 2019-08-26: qty 750
  Filled 2019-08-26: qty 250
  Filled 2019-08-26: qty 1000
  Filled 2019-08-26: qty 500
  Filled 2019-08-26: qty 250

## 2019-08-26 MED ORDER — PHENYLEPHRINE HCL-NACL 10-0.9 MG/250ML-% IV SOLN
INTRAVENOUS | Status: AC
  Filled 2019-08-26: qty 250

## 2019-08-26 MED ORDER — INSULIN GLARGINE 100 UNIT/ML ~~LOC~~ SOLN
10.0000 [IU] | Freq: Every day | SUBCUTANEOUS | Status: DC
  Administered 2019-08-26 – 2019-08-31 (×6): 10 [IU] via SUBCUTANEOUS
  Filled 2019-08-26 (×6): qty 0.1

## 2019-08-26 MED ORDER — PHENYLEPHRINE HCL-NACL 10-0.9 MG/250ML-% IV SOLN
0.0000 ug/min | INTRAVENOUS | Status: DC
  Administered 2019-08-26: 20 ug/min via INTRAVENOUS

## 2019-08-26 MED ORDER — SODIUM CHLORIDE 0.9 % IV SOLN
250.0000 mL | INTRAVENOUS | Status: DC
  Administered 2019-08-29: 250 mL via INTRAVENOUS

## 2019-08-26 MED ORDER — SODIUM CHLORIDE 0.9 % IV SOLN: INTRAVENOUS | Status: DC | PRN

## 2019-08-26 NOTE — Progress Notes (Signed)
ABP 149/62. Neo at 90 mcg/min.

## 2019-08-26 NOTE — Progress Notes (Signed)
Pt taken to MRI  

## 2019-08-26 NOTE — Progress Notes (Signed)
BP running low (110-120), Pt drowsy for Dr Corliss Skains, L side weaker for Dr D. VO given to keep SBP 140-160, 500 mL NS bolus given, Neo gtt started.

## 2019-08-26 NOTE — Progress Notes (Signed)
Discussed MRI (that results are back) with ALLY PA. Radiology team to update family as they were expecting her to go home today.

## 2019-08-26 NOTE — Consult Note (Addendum)
Neurology Consultation  Reason for Consult: Lethargy, new strokes Referring Physician: Dr. Juliet Rude, neuro interventional radiology  CC: Decreased level of consciousness, new strokes  History is obtained from: Chart review, son over the phone  HPI: BOBBIE VIRDEN is a 69 y.o. 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 Postprocedure MRI showed scattered small acute infarcts along the right MCA/watershed territory and a solitary small acute infarct in the left parietal cortex and right cerebellum.  There was improved ICA patency on the right at the petrous segment.  Also seen is known severe right M1 stenosis.  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 Neurology was consulted because she became more weaker than her baseline over the course of last at least 12 to 14 hours and was also more lethargic on follow-up examination by the primary team. No family at bedside at this point. Called her son who reports that she had very mild if any deficits from the prior stroke.  She is able to walk without support.  Takes care of her ADLs.   LKW: Unclear tpa given?: no, unclear last known well Premorbid modified Rankin scale (mRS): 1  ROS: Unable to reliably obtain  Past Medical History:  Diagnosis Date  . Anemia    low iron - not since having fibroids removed  . Arthritis   . Hyperlipidemia   . Hypertension   . Sleep apnea    just recently diagnosed with it, has not gotten the Cpap (done 10/29/18)  . Stroke [facility])    weakness on left side  . Type II diabetes mellitus (HCC)     Family History  Problem  Relation Age of Onset  . Stroke Mother   . Diabetes Father   . Diabetes Sister   . Breast cancer Neg Hx     Social History:   reports that she has never smoked. She has never used smokeless tobacco. She reports that she does not drink alcohol and does not use drugs.  Medications  Current Facility-Administered Medications:  .  0.9 %  sodium chloride infusion, , Intravenous, Continuous, [name], Sanjeev, MD, Last Rate: 75 mL/hr at 08/26/19 0800, Rate Verify at 08/26/19 0800 .  0.9 %  sodium chloride infusion, 250 mL, Intravenous, Continuous, [name], Sanjeev, MD .  acetaminophen (TYLENOL) tablet 650 mg, 650 mg, Oral, Q4H PRN **OR** acetaminophen (TYLENOL) 160 MG/5ML solution 650 mg, 650 mg, Per Tube, Q4H PRN **OR** acetaminophen (TYLENOL) suppository 650 mg, 650 mg, Rectal, Q4H PRN, [name], Sanjeev, MD .  amLODipine (NORVASC) tablet 10 mg, 10 mg, Oral, Daily, Louk, Alexandra M, PA-C, 10 mg at 08/26/19 0919 .  aspirin chewable tablet 81 mg, 81 mg, Oral, Daily, 81 mg at 08/26/19 0919 **OR** aspirin chewable tablet 81 mg, 81 mg, Per Tube, Daily, [name], Sanjeev, MD .  carvedilol (COREG) tablet 6.25 mg, 6.25 mg, Oral, BID WC, Louk, Alexandra M, PA-C, 6.25 mg at 08/26/19 0803 .  Chlorhexidine Gluconate Cloth 2 % PADS 6 each, 6 each, Topical, Daily, Luanne Bras, MD, 6 each at 08/25/19 1505 .  eptifibatide (INTEGRILIN) injection, , Intravenous, PRN, [name], Sanjeev, MD, 1.5 mL at 08/25/19 1124 .  heparin sodium (porcine) injection, , ,  PRN, Julieanne Cotton, MD, 2,000 Units at 08/25/19 0939 .  hydrALAZINE (APRESOLINE) tablet 50 mg, 50 mg, Oral, BID, Louk, Alexandra M, PA-C, 50 mg at 08/26/19 0919 .  hydrochlorothiazide (MICROZIDE) capsule 12.5 mg, 12.5 mg, Oral, Daily, Louk, Alexandra M, PA-C, 12.5 mg at 08/26/19 0919 .  insulin aspart (novoLOG) injection 0-15 Units, 0-15 Units, Subcutaneous, TID WC, Louk, Alexandra M, PA-C, 5 Units at 08/26/19 1319 .  irbesartan (AVAPRO) tablet  150 mg, 150 mg, Oral, Daily, Louk, Alexandra M, PA-C, 150 mg at 08/26/19 0919 .  nitroGLYCERIN 1 mg/10 mL (100 mcg/mL) - IR/CATH LAB, , , PRN, Julieanne Cotton, MD, 200 mcg at 08/25/19 1157 .  phenylephrine (NEOSYNEPHRINE) 10-0.9 MG/250ML-% infusion, , , ,  .  phenylephrine (NEOSYNEPHRINE) 10-0.9 MG/250ML-% infusion, 25-200 mcg/min, Intravenous, Titrated, [name], Simonne Maffucci, MD .  Radial Cocktail (nitroglycerin/verapamil/heparin) for IR, , Intra-arterial, PRN, Julieanne Cotton, MD, Given at 08/25/19 0939 .  ticagrelor (BRILINTA) tablet 90 mg, 90 mg, Oral, BID, 90 mg at 08/26/19 0919 **OR** ticagrelor (BRILINTA) tablet 90 mg, 90 mg, Per Tube, BID, [name], Sanjeev, MD  Exam: Current vital signs: BP (!) 106/54   Pulse 95   Temp 99.5 F (37.5 C)   Resp 17   Ht 5\' 1"  (1.549 m)   Wt 68.5 kg   SpO2 100%   BMI 28.53 kg/m  Vital signs in last 24 hours: Temp:  [97.2 F (36.2 C)-99.5 F (37.5 C)] 99.5 F (37.5 C) (06/15 1200) Pulse Rate:  [57-106] 95 (06/15 1055) Resp:  [8-28] 17 (06/15 1055) BP: (71-143)/(45-114) 106/54 (06/15 1055) SpO2:  [88 %-100 %] 100 % (06/15 1055) Arterial Line BP: (76-173)/(37-81) 150/60 (06/15 1000) General: Awake alert in no distress HEENT: Neuropsych atraumatic Lungs: Clear Cardiovascular regular rhythm Abdomen: tender all over. Inconsistent exam Neurologic exam She is awake alert oriented She is slow to respond to questions but is not aphasic There is no evidence of dysarthria Cranial nerves: Pupils equal round reactive light, she has a rightward gaze preference, is able to get her eyes towards midline but not crossing the midline to look to the left, does not blink to threat from the left and is not able to count fingers in the left field.  No facial asymmetry. Motor exam: There is drift in left upper and lower extremity.  No drift in right upper and lower extremity. Sensory exam: There is extinction to double simultaneous stimulation on the  left. Coordination: No obvious gross dysmetria noted on the right NIHSS 1a Level of Conscious.: 0 1b LOC Questions: 0 1c LOC Commands: 0 2 Best Gaze: 1 3 Visual: 2 4 Facial Palsy: 0 5a Motor Arm - left: 1 5b Motor Arm - Right: 0 6a Motor Leg - Left: 1 6b Motor Leg - Right: 0 7 Limb Ataxia: 0 8 Sensory: 1 9 Best Language: 0 10 Dysarthria: 0 11 Extinct. and Inatten.: 2 TOTAL: 8  Labs I have reviewed labs in epic and the results pertinent to this consultation are: CBC Latest Ref Rng & Units 08/26/2019 08/26/2019 08/25/2019  WBC 4.0 - 10.5 K/uL 11.8(H) 11.4(H) 4.8  Hemoglobin 12.0 - 15.0 g/dL 7.0(L) 6.9(LL) 13.3  Hematocrit 36 - 46 % 20.4(L) 20.4(L) 42.2  Platelets 150 - 400 K/uL 223 206 283   CBC    Component Value Date/Time   WBC 11.8 (H) 08/26/2019 0732   RBC 2.27 (L) 08/26/2019 0732   HGB 7.0 (L) 08/26/2019 0732   HCT 20.4 (L) 08/26/2019 0732   PLT 223 08/26/2019 0732  MCV 89.9 08/26/2019 0732   MCH 30.8 08/26/2019 0732   MCHC 34.3 08/26/2019 0732   RDW 12.9 08/26/2019 0732   LYMPHSABS 2.1 08/26/2019 0732   MONOABS 0.7 08/26/2019 0732   EOSABS 0.0 08/26/2019 0732   BASOSABS 0.0 08/26/2019 0732   CMP     Component Value Date/Time   NA 134 (L) 08/26/2019 0555   NA 141 05/22/2019 1312   K 4.5 08/26/2019 0555   CL 103 08/26/2019 0555   CO2 17 (L) 08/26/2019 0555   GLUCOSE 289 (H) 08/26/2019 0555   BUN 31 (H) 08/26/2019 0555   BUN 18 05/22/2019 1312   CREATININE 1.78 (H) 08/26/2019 0555   CALCIUM 8.0 (L) 08/26/2019 0555   PROT 7.5 05/22/2019 1312   ALBUMIN 4.0 05/22/2019 1312   AST 15 05/22/2019 1312   ALT 19 05/22/2019 1312   ALKPHOS 74 05/22/2019 1312   BILITOT 0.3 05/22/2019 1312   GFRNONAA 29 (L) 08/26/2019 0555   GFRAA 33 (L) 08/26/2019 0555   Lipid Panel     Component Value Date/Time   CHOL 217 (H) 05/22/2019 1312   TRIG 114 05/22/2019 1312   HDL 65 05/22/2019 1312   CHOLHDL 3.3 05/22/2019 1312   LDLCALC 132 (H) 05/22/2019 1312   Imaging I  have reviewed the images obtained:  MRI brain shows scattered small acute infarcts along the right MCA watershed territory.  Solitary small acute infarct in the left frontal cortex and right cerebellum. Improved right ICA patency at the petrous segment. 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  Assessment:  69 year old status post right ICA cervical/P2 junction balloon angioplasty for severe stenosis with residual stenosis of approximately 25% and severe stenosis of the right MCA M1 segment, noted to have worsening left-sided weakness and neglect. Labs also reveal a significant drop in hemoglobin. Suspect watershed infarcts post procedure versus hypoperfusion in the setting of significant acute blood loss anemia. Strongly suspect retroperitoneal hematoma.  Impression: Right hemispheric infarcts-likely secondary to the known occlusions Isolated left hemispheric infarct and right cerebellar infarct-raise suspicion for cardioembolic etiology. Evaluate for retroperitoneal hematoma  Recommendations: Stat CT abdomen pelvis to look for a retroperitoneal bleed. I would recommend completing the full stroke work-up to include 2D echo, A1c and lipid panel. Avoid hypotension-keep systolic blood pressures between 140-160. Management of retroperitoneal bleed, IF CONFIRMED, per primary team/vascular surgery. Antiplatelets per neuro endovascular and after results of CT abdomen pelvis are made available. PT OT speech therapy Supportive treatment per primary team Stroke team will follow with you.  Discussed with Dr. Corliss Skains, neuro interventional radiology.  -- Milon Dikes, MD [facility] Pager: [phone] If 7pm to 7am, please call on call as listed on AMION.

## 2019-08-26 NOTE — Progress Notes (Signed)
PT Cancellation Note  Patient Details Name: DATRA CLARY MRN: [id number] DOB: 1951-01-25   Cancelled Treatment:    Reason Eval/Treat Not Completed: Patient at procedure or test/unavailable Pt currently in MRI. Will follow up as schedule allows.   Cindee Salt, DPT  Acute Rehabilitation Services  Pager: [phone] Office: [phone]    Lehman Prom 08/26/2019, 10:39 AM

## 2019-08-26 NOTE — Progress Notes (Signed)
Rehab Admissions Coordinator Note:  Per PT recommendation, this patient was screened by Cheri Rous for appropriateness for an Inpatient Acute Rehab Consult.  At this time, we are recommending Inpatient Rehab consult. AC will contact MD to request order.   Cheri Rous 08/26/2019, 2:53 PM  I can be reached at [phone].

## 2019-08-26 NOTE — Evaluation (Signed)
Physical Therapy Evaluation Patient Details Name: Donna [name] MRN: [id number] DOB: 12/06/1950 Today's Date: 08/26/2019   History of Present Illness  Pt is a 69 y/o female presenting secondary to R ICA stenosis. Pt is s/p revascularization with balloon angioplasty. During admission, pt with worsening L sided weakness. MRI revealed scattered R MCA/watershed territory infarcts. PMH includes HTN, CVA, DM and OSA.   Clinical Impression  Pt admitted secondary to problem above with deficits below. Pt with L lateral lean, L extremity weakness and decreased coordination. Pt also impulsive with decreased safety awareness. Required mod A to stand and take side steps at EOB. Pt was previously very independent. Reports she lives with her son and grandson. Feel she would be excellent candidate for CIR in order to regain independence and safety with mobility tasks. Will continue to follow acutely.     Follow Up Recommendations CIR    Equipment Recommendations  Other (comment) (TBD)    Recommendations for Other Services Rehab consult     Precautions / Restrictions Precautions Precautions: Fall Restrictions Weight Bearing Restrictions: No      Mobility  Bed Mobility Overal bed mobility: Needs Assistance Bed Mobility: Supine to Sit;Sit to Supine     Supine to sit: Mod assist Sit to supine: Mod assist   General bed mobility comments: Mod A for trunk assist and LE assist. Required multimodal cues for sequencing. Pt sitting on very edge of bed upon return to sitting and required assist to scoot hips back onto EOB. L lateral lean noted in sitting.   Transfers Overall transfer level: Needs assistance Equipment used: None Transfers: Sit to/from Stand Sit to Stand: Mod assist         General transfer comment: Mod A for lift assist and steadying. Pt with L lateral trunk lean in standing. REquired manual blocking of L knee.   Ambulation/Gait Ambulation/Gait assistance: Mod assist            General Gait Details: Mod A for steadying assist to take steps at EOB. Required manual blocking of L knee. Pt with increased difficulty sequencing to take steps.   Stairs            Wheelchair Mobility    Modified Rankin (Stroke Patients Only) Modified Rankin (Stroke Patients Only) Pre-Morbid Rankin Score: No symptoms Modified Rankin: Moderately severe disability     Balance Overall balance assessment: Needs assistance Sitting-balance support: No upper extremity supported;Feet supported Sitting balance-Leahy Scale: Poor Sitting balance - Comments: Reliant on at least min A to maintain sitting balance. L lateral lean noted.  Postural control: Left lateral lean Standing balance support: Single extremity supported Standing balance-Leahy Scale: Poor Standing balance comment: Reliant on mod A to maintain standing balance. L lateral lean noted.                              Pertinent Vitals/Pain Pain Assessment: No/denies pain    Home Living Family/patient expects to be discharged to:: Private residence Living Arrangements: Children;Other relatives Available Help at Discharge: Family Type of Home: House Home Access: Level entry     Home Layout: One level Home Equipment: None Additional Comments: No family present. Unsure of accuracy.     Prior Function Level of Independence: Independent               Hand Dominance        Extremity/Trunk Assessment   Upper Extremity Assessment Upper Extremity Assessment: Defer to OT  evaluation    Lower Extremity Assessment Lower Extremity Assessment: LLE deficits/detail LLE Deficits / Details: Able to perform partial SLR in supine. Unable to test formally sitting EOB as pt impulsive. Pt with functional weakness noted as well.  LLE Coordination: decreased gross motor       Communication   Communication: No difficulties  Cognition Arousal/Alertness: Awake/alert Behavior During Therapy:  Impulsive Overall Cognitive Status: Impaired/Different from baseline Area of Impairment: Following commands;Safety/judgement;Awareness;Problem solving                       Following Commands: Follows one step commands with increased time Safety/Judgement: Decreased awareness of safety;Decreased awareness of deficits Awareness: Intellectual Problem Solving: Slow processing;Requires verbal cues;Requires tactile cues General Comments: Pt very impulsive and requiring safety cues throughout. Was A and O X4. Pt attempting to get out of bed independently upon entry. Very poor safety awareness.       General Comments      Exercises     Assessment/Plan    PT Assessment Patient needs continued PT services  PT Problem List Decreased strength;Decreased balance;Decreased mobility;Decreased cognition;Decreased knowledge of use of DME;Decreased safety awareness;Decreased knowledge of precautions;Decreased coordination       PT Treatment Interventions DME instruction;Gait training;Functional mobility training;Therapeutic activities;Balance training;Therapeutic exercise;Cognitive remediation;Neuromuscular re-education;Patient/family education    PT Goals (Current goals can be found in the Care Plan section)  Acute Rehab PT Goals Patient Stated Goal: "to go to the bathroom"  PT Goal Formulation: With patient Time For Goal Achievement: 09/09/19 Potential to Achieve Goals: Good    Frequency Min 4X/week   Barriers to discharge        Co-evaluation               AM-PAC PT "6 Clicks" Mobility  Outcome Measure Help needed turning from your back to your side while in a flat bed without using bedrails?: A Lot Help needed moving from lying on your back to sitting on the side of a flat bed without using bedrails?: A Lot Help needed moving to and from a bed to a chair (including a wheelchair)?: A Lot Help needed standing up from a chair using your arms (e.g., wheelchair or bedside  chair)?: A Lot Help needed to walk in hospital room?: A Lot Help needed climbing 3-5 steps with a railing? : Total 6 Click Score: 11    End of Session Equipment Utilized During Treatment: Gait belt Activity Tolerance: Patient tolerated treatment well Patient left: in bed;with call bell/phone within reach;with bed alarm set Nurse Communication: Mobility status PT Visit Diagnosis: Hemiplegia and hemiparesis;Difficulty in walking, not elsewhere classified (R26.2) Hemiplegia - Right/Left: Left Hemiplegia - caused by: Cerebral infarction    Time: 6759-1638 PT Time Calculation (min) (ACUTE ONLY): 21 min   Charges:   PT Evaluation $PT Eval Moderate Complexity: 1 Mod          Reuel Derby, PT, DPT  Acute Rehabilitation Services  Pager: [phone] Office: [phone]   Rudean Hitt 08/26/2019, 1:42 PM

## 2019-08-26 NOTE — Progress Notes (Signed)
NIR Brief Note:  Right ICA cervical/petrous junction stenosis s/p revascularization using balloon angioplasty via right radial and right femoral approach 08/25/2019 by Dr. Corliss Skains.  Went to evaluate patient bedside alongside Dr. Corliss Skains. Patient laying in bed resting comfortably. She responds to voice and answers questions appropriately, however appears lethargic- easily falls back asleep during interactions. No complaints- denies headaches at this time. States that she is tired and did not sleep well last evening.  Lethargic but arousable. Follows simple commands. Oriented x3. Can spontaneously move all extremities with obvious weakness on left that has progressed since this AM- with worsening left pronator drift (able to lift LUE off bed but cannot hold), can lift LLE off bed but cannot hold, right dorsiflexion/plantar flexion unchanged from this AM exam.  ?hypertension over-controlled due to Cleviprex and home BP medication use. New BP goal = 140-160 systolic, add 500 cc bolus NS and Norepinephrine to meet goal. Will consult neurology regarding progressive decline in neurologic function- appreciate and agree with management. Continue taking Brilinta 90 mg twice daily and Aspirin 81 mg once daily. Further plans per [facility]- appreciate and agree with management. NIR to follow.   Waylan Boga Gaither Biehn, PA-C 08/26/2019, 2:41 PM

## 2019-08-26 NOTE — Progress Notes (Addendum)
Patient ID: Donna [name], female   DOB: 1951/03/11, 69 y.o.   MRN: [id number] INR. Patient significantly improved neurologically following fluid bolus and having started vasopressors.  BP in the 150/60 to 70s . More alert awake . Able to lift arm above her head. Distal pulses dopplerable. RT upper to mid thigh prominent in girth. Tender in the firm area marked on the skin. CT  of the abdomen and pelvis reveals the presence of  A large well defined hematoma measuring 16cm in length and 7 cm in max width. No evidence of a retroperitoneal bleed. The finding is probably partly responsible for the drop in patients HB from 13 to approx  7.. Plan  1. Neurology recs pending. 2.Hold pm BP meds for tonite to maintain BP of 140 to 160 sydtolic. 3.Vascular surgery consult regarding the large RT anterior  thigh hematoma. 4.Stat PT and INR.. D/W patient  S.Kendrell Lottman MD

## 2019-08-26 NOTE — Consult Note (Signed)
ASSESSMENT & PLAN   RIGHT THIGH HEMATOMA: I have reviewed the patient's CT of the abdomen and pelvis.  The patient's has some hematoma in the right thigh with no focal discrete collection or evidence of active bleeding that I can tell.  Bleeding is diffuse mostly into the muscle and tissue.  She has no evidence of active bleeding now.  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  We will follow.  REASON FOR CONSULT:    Right thigh hematoma.  The consult is requested by Dr. Corliss Skains.   HPI:   Donna [name] is a 69 y.o. female who had a right brain stroke in April 2020 according to the patient.  This was associated with left-sided weakness.  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  She had a drop in her hemoglobin which was partly delusional however given that she was [place] Witness she underwent a CT without contrast today which shows hematoma in the right thigh.  For this reason vascular surgery was consulted.  She denies significant pain in her right groin or right thigh.  She is on aspirin and Brilinta.  She is not on any other blood thinners.  Past Medical History:  Diagnosis Date  . Anemia    low iron - not since having fibroids removed  . Arthritis   . Hyperlipidemia   . Hypertension   . Sleep apnea    just recently diagnosed with it, has not gotten the Cpap (done 10/29/18)  . Stroke [facility])    weakness on left side  . Type II diabetes mellitus (HCC)     Family History  Problem Relation Age of Onset  . Stroke Mother   . Diabetes Father   . Diabetes Sister   . Breast cancer Neg Hx     SOCIAL HISTORY: Social History   Tobacco Use  . Smoking status: Never Smoker  . Smokeless tobacco: Never Used  Substance Use Topics  . Alcohol  use: No    No Known Allergies  Current Facility-Administered Medications  Medication Dose Route Frequency Provider Last Rate Last Admin  . 0.9 %  sodium chloride infusion (Manually program via Guardrails IV Fluids)   Intravenous Once Jonah Blue, MD      . 0.9 %  sodium chloride infusion   Intravenous Continuous Julieanne Cotton, MD   Stopped at 08/26/19 1419  . 0.9 %  sodium chloride infusion  250 mL Intravenous Continuous [name], Sanjeev, MD      . acetaminophen (TYLENOL) tablet 650 mg  650 mg Oral Q4H PRN [name], Simonne Maffucci, MD       Or  . acetaminophen (TYLENOL) 160 MG/5ML solution 650 mg  650 mg Per Tube Q4H PRN [name], Simonne Maffucci, MD       Or  . acetaminophen (TYLENOL) suppository 650 mg  650 mg Rectal Q4H PRN [name], Sanjeev, MD      . amLODipine (NORVASC) tablet 10 mg  10 mg Oral Daily Louk, Alexandra M, PA-C   10 mg at 08/26/19 0919  . aspirin chewable tablet 81 mg  81 mg Oral Daily Julieanne Cotton, MD   81 mg at 08/26/19 0919   Or  . aspirin chewable tablet 81 mg  81 mg Per Tube Daily Julieanne Cotton, MD      .  Chlorhexidine Gluconate Cloth 2 % PADS 6 each  6 each Topical Daily Julieanne Cotton, MD   6 each at 08/25/19 1505  . eptifibatide (INTEGRILIN) injection   Intravenous PRN Julieanne Cotton, MD   1.5 mL at 08/25/19 1124  . heparin sodium (porcine) injection    PRN Julieanne Cotton, MD   2,000 Units at 08/25/19 0939  . insulin aspart (novoLOG) injection 0-15 Units  0-15 Units Subcutaneous TID WC Louk, Alexandra M, PA-C   5 Units at 08/26/19 1319  . insulin glargine (LANTUS) injection 10 Units  10 Units Subcutaneous Daily Jonah Blue, MD      . irbesartan Evlyn Kanner) tablet 150 mg  150 mg Oral Daily Louk, Alexandra M, PA-C   150 mg at 08/26/19 0919  . nitroGLYCERIN 1 mg/10 mL (100 mcg/mL) - IR/CATH LAB    PRN Julieanne Cotton, MD   200 mcg at 08/25/19 1157  . phenylephrine (NEOSYNEPHRINE) 10-0.9 MG/250ML-% infusion           . phenylephrine  (NEOSYNEPHRINE) 10-0.9 MG/250ML-% infusion  25-200 mcg/min Intravenous Titrated Julieanne Cotton, MD 150 mL/hr at 08/26/19 1535 100 mcg/min at 08/26/19 1535  . Radial Cocktail (nitroglycerin/verapamil/heparin) for IR   Intra-arterial PRN Julieanne Cotton, MD   Given at 08/25/19 [phone]  . ticagrelor (BRILINTA) tablet 90 mg  90 mg Oral BID Julieanne Cotton, MD   90 mg at 08/26/19 0919   Or  . ticagrelor (BRILINTA) tablet 90 mg  90 mg Per Tube BID [name], Simonne Maffucci, MD        REVIEW OF SYSTEMS:  [X]  denotes positive finding, [ ]  denotes negative finding Cardiac  Comments:  Chest pain or chest pressure:    Shortness of breath upon exertion: x   Short of breath when lying flat:    Irregular heart rhythm:        Vascular    Pain in calf, thigh, or hip brought on by ambulation:    Pain in feet at night that wakes you up from your sleep:     Blood clot in your veins:    Leg swelling:  x       Pulmonary    Oxygen at home:    Productive cough:     Wheezing:  x       Neurologic    Sudden weakness in arms or legs:     Sudden numbness in arms or legs:     Sudden onset of difficulty speaking or slurred speech:    Temporary loss of vision in one eye:     Problems with dizziness:         Gastrointestinal    Blood in stool:     Vomited blood:         Genitourinary    Burning when urinating:     Blood in urine:        Psychiatric    Major depression:         Hematologic    Bleeding problems:    Problems with blood clotting too easily:        Skin    Rashes or ulcers:        Constitutional    Fever or chills:    -  PHYSICAL EXAM:   Vitals:   08/26/19 1510 08/26/19 1515 08/26/19 1530 08/26/19 1600  BP:  120/75    Pulse: 100 98 98   Resp: 19 (!) 21 (!) 21   Temp:    98.5 F (36.9 C)  TempSrc:  Oral  SpO2: 100% 100% 100%   Weight:      Height:       Body mass index is 28.53 kg/m.   GENERAL: The patient is a well-nourished female, in no acute distress. The  vital signs are documented above. CARDIAC: There is a regular rate and rhythm.  VASCULAR: I do not detect carotid bruits. She has palpable femoral pulses. She has brisk Doppler signals in the dorsalis pedis and posterior tibial positions bilaterally. She has mild right thigh swelling.  She has no significant lower leg swelling. PULMONARY: There is good air exchange bilaterally.  She does have some expiratory wheezing.   ABDOMEN: Soft and non-tender with normal pitched bowel sounds.  MUSCULOSKELETAL: There are no major deformities. NEUROLOGIC: No focal weakness or paresthesias are detected. SKIN: There are no ulcers or rashes noted. PSYCHIATRIC: The patient has a normal affect.  DATA:    CT ABDOMEN PELVIS: I reviewed the images of her CT scan.  She is noted to have some retroperitoneal hematoma in the right iliac and inguinal regions with some soft tissue hematoma in the right thigh and hip.  Lab Results  Component Value Date   WBC 11.8 (H) 08/26/2019   HGB 7.0 (L) 08/26/2019   HCT 20.4 (L) 08/26/2019   MCV 89.9 08/26/2019   PLT 223 08/26/2019   Lab Results  Component Value Date   NA 134 (L) 08/26/2019   K 4.5 08/26/2019   CL 103 08/26/2019   CO2 17 (L) 08/26/2019   Lab Results  Component Value Date   CREATININE 1.78 (H) 08/26/2019   Lab Results  Component Value Date   INR 0.9 08/18/2019   INR 0.9 11/27/2018   INR 1.0 10/18/2018   Lab Results  Component Value Date   HGBA1C 11.2 (H) 05/22/2019   CBG (last 3)  Recent Labs    08/25/19 2150 08/26/19 0805 08/26/19 [facility]: [phone] Office: [phone]

## 2019-08-26 NOTE — Progress Notes (Signed)
Inpatient Diabetes Program Recommendations  AACE/ADA: New Consensus Statement on Inpatient Glycemic Control (2015)  Target Ranges:  Prepandial:   less than 140 mg/dL      Peak postprandial:   less than 180 mg/dL (1-2 hours)      Critically ill patients:  140 - 180 mg/dL   Lab Results  Component Value Date   GLUCAP 213 (H) 08/26/2019   HGBA1C 11.2 (H) 05/22/2019    Review of Glycemic Control  Results for Donna [name], Donna [name] (MRN [id number]) as of 08/26/2019 13:24  Ref. Range 08/25/2019 12:37 08/25/2019 17:02 08/25/2019 21:50 08/26/2019 08:05 08/26/2019 12:03  Glucose-Capillary Latest Ref Range: 70 - 99 mg/dL 407 (H) 680 (H) 881 (H) 277 (H) 213 (H)   Results for [name], [name] (MRN [id number]) as of 08/26/2019 13:24  Ref. Range 05/22/2019 13:12  Hemoglobin A1C Latest Ref Range: 4.8 - 5.6 % 11.2 (H)   Diabetes history:  DM2  Outpatient Diabetes medications:  Xultophy 30 units daily (confirmed with patient)  Current orders for Inpatient glycemic control:  Novolog 0-15 tid with meals  Inpatient Diabetes Program Recommendations:     Confirmed with patoent that she takes Xultophy 30 units daily at home.    Please consider Levemir 15 units daily and adjust with trends.  Will continue to follow while inpatient.  Thank you, Dulce Sellar, RN, BSN Diabetes Coordinator Inpatient Diabetes Program [phone] (team pager from 8a-5p)

## 2019-08-26 NOTE — Progress Notes (Signed)
ANTICOAGULATION CONSULT NOTE - Follow Up Consult  Pharmacy Consult for Heparin Indication: Post neuro IR procedure  Assessment: 69 yr old female with high-grade R MCA stenosis, s/p balloon angioplasty. IV heparin to run until sheath removal in AM tomorrow.  Heparin level <0.1 units/ml  Goal of Therapy:  Heparin level 0.1-0.25 units/ml Monitor platelets by anticoagulation protocol: Yes   Plan:  Increase heparin to 300 units/hr Heparin infusion off at 0800  Thanks for allowing pharmacy to be a part of this patient's care.  Talbert Cage, PharmD Clinical Pharmacist

## 2019-08-26 NOTE — Progress Notes (Signed)
Called for SBP less than goal of 140-160. She is maxed on neo at a rate of 200. The patient is anemic with a Hgb of 7.0. 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 The patient has watershed strokes in her right hemisphere secondary to combination of her anemia, low BP and right ICA stenosis. Of note, she also has a retroperitoneal bleed.   She is S/P RT  ICA cer/petrous junction balloon angioplasty for severe stenosis. Post-procedure with residual stenosis of approx 25 %. Severe stenosis of RT MCA M1 seg with improved flow post angioplasty.   A/R:  1. Bolus 1 L NS x 1 now 2. CCM has been consulted for assistance in BP management with possible additional pressor towards a SBP goal of 140-160  Electronically signed: Dr. Caryl Pina

## 2019-08-26 NOTE — Consult Note (Signed)
Medical Consultation   Donna [name]  TML:[id number]  DOB: 05/21/1950  DOA: 08/25/2019  PCP: Arnette Felts, FNP   Outpatient Specialists: Stacie Acres - podiatry; Jacques Navy - cardiology    Requesting physician: Deveschwar - IR  Reason for consultation: Brought in yesterday, had ICA stenosis, did well yesterday.  L-sided weakness overnight, MRI pending.  Hgb decreased and renal function worsened, also needs DM management.  Groin puncture site looks site, uncertain why her Hgb dropped so much.   History of Present Illness: Donna [name] is an 69 y.o. female with h/o CVA (2020); OSA; DM; HTN; and HLD who was admitted by IR on 6/14 for R ICA angioplasty/stent placement.  The patient reports chronic left-sided weakness, unchanged from prior.   She denies any acute issues.  However, the nurse caring for her reports that she is clearly different today - perseverative, confused, agitated, and significantly weaker on the left body.    Review of Systems:  ROS As per HPI otherwise 10 point review of systems negative. Limited by patient's apparently AMS.   Past Medical History: Past Medical History:  Diagnosis Date  . Anemia    low iron - not since having fibroids removed  . Arthritis   . Hyperlipidemia   . Hypertension   . Sleep apnea    just recently diagnosed with it, has not gotten the Cpap (done 10/29/18)  . Stroke [facility])    weakness on left side  . Type II diabetes mellitus (HCC)     Past Surgical History: Past Surgical History:  Procedure Laterality Date  . COLONOSCOPY    . IR ANGIO INTRA EXTRACRAN SEL COM CAROTID INNOMINATE BILAT MOD SED  10/18/2018  . IR ANGIO INTRA EXTRACRAN SEL COM CAROTID INNOMINATE BILAT MOD SED  08/18/2019  . IR ANGIO VERTEBRAL SEL SUBCLAVIAN INNOMINATE UNI R MOD SED  08/18/2019  . IR ANGIO VERTEBRAL SEL VERTEBRAL BILAT MOD SED  10/18/2018  . IR ANGIO VERTEBRAL SEL VERTEBRAL UNI L MOD SED  08/18/2019  . IR US GUIDE VASC ACCESS RIGHT  10/18/2018  . IR  US GUIDE VASC ACCESS RIGHT  08/18/2019  . MYOMECTOMY    . RADIOLOGY WITH ANESTHESIA N/A 08/18/2019   Procedure: STENTING;  Surgeon: Julieanne Cotton, MD;  Location: [facility] OR;  Service: Radiology;  Laterality: N/A;  . RADIOLOGY WITH ANESTHESIA N/A 08/25/2019   Procedure: STENTING;  Surgeon: Julieanne Cotton, MD;  Location: [facility] OR;  Service: Radiology;  Laterality: N/A;     Allergies:  No Known Allergies   Social History:  reports that she has never smoked. She has never used smokeless tobacco. She reports that she does not drink alcohol and does not use drugs.   Family History: Family History  Problem Relation Age of Onset  . Stroke Mother   . Diabetes Father   . Diabetes Sister   . Breast cancer Neg Hx       Physical Exam: Vitals:   08/26/19 1500 08/26/19 1510 08/26/19 1515 08/26/19 1530  BP: (!) 117/54  120/75   Pulse: 97 100 98 98  Resp: 20 19 (!) 21 (!) 21  Temp:      TempSrc:      SpO2: 100% 100% 100% 100%  Weight:      Height:        Constitutional: Alert and awake, not in any acute distress, somewhat agitated. Eyes:  EOMI, irises appear normal, anicteric sclera,  ENMT: external ears and nose appear normal, normal hearing, Lips appear normal Neck: neck appears normal, no masses, normal ROM, no thyromegaly, no JVD  CVS: RR with mild tachcyardia, no murmur rubs or gallops, no LE edema, normal pedal pulses  Respiratory:  clear to auscultation bilaterally, no wheezing, rales or rhonchi. Respiratory effort normal. No accessory muscle use.  Abdomen: soft nontender, nondistended Musculoskeletal: : L lower arm is in a splint with marked left-sided hemiparesis Neuro: Cranial nerves II-XII intact Psych: judgement and insight appear impaired Skin: no rashes or lesions or ulcers, no induration or nodules    Data reviewed:  I have personally reviewed the recent labs and imaging studies  Pertinent Labs:   CO2 17 Glucose 289 BUN 31/Creatinine 1.78/GFR 33; 26/0.93/>60 on  6/7 WBC 11.4 Hgb 6.9 (confirmed); 13.3 on 6/14   Inpatient Medications:   Scheduled Meds: . amLODipine  10 mg Oral Daily  . aspirin  81 mg Oral Daily   Or  . aspirin  81 mg Per Tube Daily  . carvedilol  6.25 mg Oral BID WC  . Chlorhexidine Gluconate Cloth  6 each Topical Daily  . hydrALAZINE  50 mg Oral BID  . hydrochlorothiazide  12.5 mg Oral Daily  . insulin aspart  0-15 Units Subcutaneous TID WC  . irbesartan  150 mg Oral Daily  . ticagrelor  90 mg Oral BID   Or  . ticagrelor  90 mg Per Tube BID   Continuous Infusions: . sodium chloride Stopped (08/26/19 1419)  . sodium chloride    . phenylephrine    . phenylephrine (NEO-SYNEPHRINE) Adult infusion 100 mcg/min (08/26/19 1535)     Radiological Exams on Admission: MR ANGIO HEAD WO CONTRAST  Result Date: 08/26/2019 CLINICAL DATA:  Right ICA angioplasty with left-sided weakness EXAM: MRI HEAD WITHOUT CONTRAST MRA HEAD WITHOUT CONTRAST TECHNIQUE: Multiplanar, multiecho pulse sequences of the brain and surrounding structures were obtained without intravenous contrast. Angiographic images of the head were obtained using MRA technique without contrast. COMPARISON:  10/09/2018 FINDINGS: MRI HEAD FINDINGS Brain: Small (subcentimeter) scattered cortical and white matter infarcts along the right cerebral convexity and in the high left parietal cortex. On the right these are in a roughly watershed distribution. Small acute infarct in the upper right cerebellum. Small remote left cerebellar infarct Remote perforator infarct at the right basal ganglia. Mild chronic small vessel ischemic change in the cerebral white matter. No hydrocephalus or collection. Vascular: See below Skull and upper cervical spine: Normal marrow signal Sinuses/Orbits: Chronic left maxillary sinusitis with complete opacification also seen in 2020. MRA HEAD FINDINGS The right ICA is smaller than the left. Only mild narrowing at the right cervical petrous segment, an  improvement. Severe right MCA stenosis with patent downstream vasculature. No left-sided or anterior cerebral stenosis or branch occlusion. High-grade narrowing at the non dominant right V4 segment. Moderate mid basilar stenosis. Patent bilateral PCA. IMPRESSION: 1. Scattered small acute infarcts along the right MCA/watershed territory. Solitary small acute infarct in the left parietal cortex and right cerebellum. 2. Improved right ICA patency at the petrous segment. 3. Known severe right M1 segment stenosis. 4. 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 Electronically Signed   By: Monte Fantasia M.D.   On: 08/26/2019 11:05   MR BRAIN WO CONTRAST  Result Date: 08/26/2019 CLINICAL DATA:  Right ICA angioplasty with left-sided weakness EXAM: MRI HEAD WITHOUT CONTRAST MRA HEAD WITHOUT CONTRAST TECHNIQUE: Multiplanar, multiecho pulse sequences of the brain and surrounding structures were obtained without  intravenous contrast. Angiographic images of the head were obtained using MRA technique without contrast. COMPARISON:  10/09/2018 FINDINGS: MRI HEAD FINDINGS Brain: Small (subcentimeter) scattered cortical and white matter infarcts along the right cerebral convexity and in the high left parietal cortex. On the right these are in a roughly watershed distribution. Small acute infarct in the upper right cerebellum. Small remote left cerebellar infarct Remote perforator infarct at the right basal ganglia. Mild chronic small vessel ischemic change in the cerebral white matter. No hydrocephalus or collection. Vascular: See below Skull and upper cervical spine: Normal marrow signal Sinuses/Orbits: Chronic left maxillary sinusitis with complete opacification also seen in 2020. MRA HEAD FINDINGS The right ICA is smaller than the left. Only mild narrowing at the right cervical petrous segment, an improvement. Severe right MCA stenosis with patent downstream vasculature. No left-sided or anterior cerebral stenosis  or branch occlusion. High-grade narrowing at the non dominant right V4 segment. Moderate mid basilar stenosis. Patent bilateral PCA. IMPRESSION: 1. Scattered small acute infarcts along the right MCA/watershed territory. Solitary small acute infarct in the left parietal cortex and right cerebellum. 2. Improved right ICA patency at the petrous segment. 3. Known severe right M1 segment stenosis. 4. 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 Electronically Signed   By: Marnee Spring M.D.   On: 08/26/2019 11:05    Impression/Recommendations Principal Problem:   Internal carotid artery stenosis, right Active Problems:   Essential hypertension   Hyperlipidemia   Cerebral infarction (HCC)   PVD (peripheral vascular disease) (HCC)   Type 2 diabetes mellitus with vascular disease (HCC)   AKI (acute kidney injury) (HCC)   Anemia  R ICA stenosis -Patient was admitted for revascularization of R ICA stenosis via angioplasty -Stenosis was improved following the procedure and the patient was observed overnight in Neuro ICU  CVA -Prior h/o CVA with left-sided deficits -However, post-procedure day #1 the weakness was progressed and the patient had AMS compared to prior -MRI/MRA confirms a small solitary infract in L parietal cortex and R cerebellum as well as scattered small acute infarcts along the R MCA/watershed territory -Neurology consult is pending -PT/OT/ST/Nutrition Consults -TOC consult for placement -Brilinta is on hold at this time  Anemia -R groin site appears C/D/I so it is not clear why she is acutely anemic -Normocytic, appears most likely related to acute blood loss -Will order transfusion of 2 units PRBC at this time  AKI -Likely ATN associated with ABLA and/or hypoperfusion -Will transfuse as above and recheck tomorrow  HTN -Allow permissive HTN for now or as per neurology/IR - concern is that overly aggressive BP management may have led to watershed -Consider  Norvasc, Creg, Hydralazine - all currently ordered -Micardis HCT is on hold at this time   HLD -Patient is on repatha -She is statin-intolerant (myopathy) -Lipids on 3/11: 217/65/132/114, indicating inadequate control   DM -Recent A1c shows very poor control - A1c was 11.2 on 3/11 -Patient appears to be on [place] as an outpatient, and both are on hold currently -She is currently only on SSI -Needs basal insulin; will add Lantus 10 units for now    Thank you for this consultation.  Our [facility] hospitalist team will follow the patient with you.   Time Spent: 55 minutes   Jonah Blue M.D. [facility] 08/26/2019, 3:53 PM

## 2019-08-26 NOTE — Anesthesia Postprocedure Evaluation (Signed)
Anesthesia Post Note  Patient: [name]  Procedure(s) Performed: STENTING (N/A )     Patient location during evaluation: PACU Anesthesia Type: General Level of consciousness: awake and alert Pain management: pain level controlled Vital Signs Assessment: post-procedure vital signs reviewed and stable Respiratory status: spontaneous breathing, nonlabored ventilation, respiratory function stable and patient connected to nasal cannula oxygen Cardiovascular status: blood pressure returned to baseline and stable Postop Assessment: no apparent nausea or vomiting Anesthetic complications: no   No complications documented.  Last Vitals:  Vitals:   08/26/19 0645 08/26/19 0700  BP: 103/62 111/65  Pulse: (!) 106 (!) 103  Resp: 17 20  Temp:    SpO2: 100% 99%    Last Pain:  Vitals:   08/26/19 0400  TempSrc: Oral  PainSc: 0-No pain    LLE Motor Response: Purposeful movement (08/26/19 0710)   RLE Motor Response: Purposeful movement (08/26/19 0710)        Donna [name] L Donna [name]

## 2019-08-26 NOTE — Progress Notes (Addendum)
Referring Physician(s): Donna [name]  Supervising Physician: Donna [name]  Patient Status:  [facility] - In-pt  Chief Complaint: Left-sided weakness  Subjective:  Right ICA cervical/petrous junction stenosis s/p revascularization using balloon angioplasty via right radial and right femoral approach 08/25/2019 by Dr. Estanislado [name]. Patient awake and alert laying in bed. Has had worsening left-sided weakness since last evening, now with left pronator drift. Right groin and right radial puncture sites c/d/i. Hgb dropped last night (6.9/7.0 from 13.3 yesterday), in addition noted to have worsening renal function this AM (BUN 31 from 26; Cr 1.78 from 0.93).   Allergies: Patient has no known allergies.  Medications: Prior to Admission medications   Medication Sig Start Date End Date Taking? Authorizing Provider  amLODipine (NORVASC) 10 MG tablet Take 1 tablet (10 mg total) by mouth daily. 05/12/19  Yes Minette Brine, FNP  aspirin EC 81 MG tablet Take 81 mg by mouth every evening.   Yes [provider]  Calcium Carbonate-Vitamin D (CALCIUM-D PO) Take 2 tablets by mouth daily at 12 noon.    Yes [provider]  carvedilol (COREG) 6.25 MG tablet TAKE 1 TABLET(6.25 MG) BY MOUTH TWICE DAILY Patient taking differently: Take 6.25 mg by mouth 2 (two) times daily with a meal.  07/31/19  Yes Minette Brine, FNP  hydrALAZINE (APRESOLINE) 50 MG tablet Take 1 tablet (50 mg total) by mouth 2 (two) times daily. 06/16/19 09/14/19 Yes Elouise Munroe, MD  Insulin Degludec-Liraglutide (XULTOPHY) 100-3.6 UNIT-MG/ML SOPN Inject 30 Units into the skin daily. 08/14/19  Yes Minette Brine, FNP  telmisartan-hydrochlorothiazide (MICARDIS HCT) 40-12.5 MG tablet Take 1 tablet by mouth daily. 07/31/19  Yes Minette Brine, FNP  ticagrelor (BRILINTA) 90 MG TABS tablet Take 1 tablet (90 mg total) by mouth 2 (two) times daily. 06/12/19  Yes Elouise Munroe, MD  vitamin C (ASCORBIC ACID) 250 MG tablet Take 500  mg by mouth daily at 12 noon.   Yes [provider]  blood glucose meter kit and supplies KIT Dispense based on patient and insurance preference. Use up to four times daily as directed. (FOR ICD-9 250.00, 250.01). 05/10/18   Minette Brine, FNP  Blood Glucose Monitoring Suppl (TRUE METRIX METER) w/Device KIT 1 each by Does not apply route in the morning, at noon, in the evening, and at bedtime. 05/22/19   Minette Brine, FNP  Evolocumab (REPATHA SURECLICK) 482 MG/ML SOAJ Inject 140 mg into the skin every 14 (fourteen) days. 06/30/19   Elouise Munroe, MD  glucose blood (TRUE METRIX BLOOD GLUCOSE TEST) test strip Check blood sugar 4 times a day before meals and bedtime 05/22/19   Minette Brine, FNP  Insulin Glargine-Lixisenatide (SOLIQUA) 100-33 UNT-MCG/ML SOPN Inject 25 Units into the skin daily. Patient taking differently: Inject 30 Units into the skin daily.  07/14/19   Minette Brine, FNP  Multiple Vitamin (MULTIVITAMIN PO) Take 1 tablet by mouth daily at 12 noon.     [provider]  Omega-3 Fatty Acids (OMEGA-3 PLUS PO) Take 2 capsules by mouth daily at 12 noon.    [provider]     Vital Signs: BP (!) 120/55   Pulse (!) 102   Temp 98.4 F (36.9 C)   Resp 19   Ht 5' 1"  (1.549 m)   Wt 151 lb (68.5 kg)   SpO2 100%   BMI 28.53 kg/m   Physical Exam Vitals and nursing note reviewed.  Constitutional:      General: She is not in  acute distress. Pulmonary:     Effort: Pulmonary effort is normal. No respiratory distress.  Skin:    General: Skin is warm and dry.     Comments: Right groin puncture site soft without active bleeding or hematoma. Right radial puncture site soft without active bleeding or hematoma.  Neurological:     Mental Status: She is alert.     Comments: Alert, awake, and oriented x3. Speech and comprehension intact. PERRL bilaterally. Can spontaneously move all extremities with weakness of left hand grip and left dorsiflexion/plantar  flexion. Left pronator drift. Distal pulses- DPs 1+ bilaterally; radials 2+ bilaterally.     Imaging: No results found.  Labs:  CBC: Recent Labs    08/18/19 0705 08/25/19 0715 08/26/19 0555 08/26/19 0732  WBC 5.0 4.8 11.4* 11.8*  HGB 12.5 13.3 6.9* 7.0*  HCT 39.3 42.2 20.4* 20.4*  PLT 249 283 206 223    COAGS: Recent Labs    10/18/18 1034 11/27/18 0728 08/18/19 0705  INR 1.0 0.9 0.9    BMP: Recent Labs    03/31/19 1000 05/22/19 1312 08/18/19 0705 08/26/19 0555  NA 136 141 136 134*  K 4.3 4.4 3.6 4.5  CL 97 99 101 103  CO2 23 29 25  17*  GLUCOSE 412* 131* 211* 289*  BUN 21 18 26* 31*  CALCIUM 10.0 10.2 9.7 8.0*  CREATININE 1.04* 0.98 0.93 1.78*  GFRNONAA 56* 60 >60 29*  GFRAA 64 69 >60 33*    LIVER FUNCTION TESTS: Recent Labs    10/07/18 1259 05/22/19 1312  BILITOT 0.3 0.3  AST 17 15  ALT 20 19  ALKPHOS 73 74  PROT 7.3 7.5  ALBUMIN 4.1 4.0    Assessment and Plan:  Right ICA cervical/petrous junction stenosis s/p revascularization using balloon angioplasty via right radial and right femoral approach 08/25/2019 by Dr. Estanislado [name]. Patient with change in neuro status (worsening left-sided weakness with pronator drift)- will obtain STAT MRI/MRA brain/head for further information. Will consult [facility] regarding drop in hgb/worsening renal function/diabetes mellitus management- appreciate and agree with management. Continue taking Brilinta 90 mg twice daily and Aspirin 81 mg once daily. NIR to follow.  ADDENDUM: Dr. Estanislado [name] called patient's sister, Donna [name], via telephone at 1146 to update on current status. All questions answered and concerns addressed.   Electronically Signed: Earley Abide, PA-C 08/26/2019, 9:54 AM   I spent a total of 25 Minutes at the the patient's bedside AND on the patient's hospital floor or unit, greater than 50% of which was counseling/coordinating care for right ICA stenosis s/p revascularization.

## 2019-08-26 NOTE — Progress Notes (Signed)
Back from MRI.

## 2019-08-26 NOTE — Progress Notes (Signed)
NIR Brief Note:  Right ICA cervical/petrous junction stenosis s/p revascularization using balloon angioplasty via right radial and right femoral approach6/14/2021by Dr. Corliss Skains.  CT abdomen/pelvis today: 1. Mild to moderate amount of retroperitoneal hematoma is noted in the right iliac and inguinal regions. Moderate size hematoma is noted in the soft tissues anterior to the musculature of the right hip. No definite intraperitoneal hemorrhage is noted. These results will be called to the ordering clinician or representative by the Radiologist Assistant, and communication documented in the PACS or zVision Dashboard. 2. Moderate size fat containing periumbilical hernia.  Went to evaluate patient bedside alongside Dr. Corliss Skains. Patient awake and alert laying in bed- appears more alert/responsive than earlier today. Accompanied by son and sister at bedside. States she is having difficulty with BMs.  Awake and alert. Follows simple commands. Oriented x3. Can spontaneously move all extremities with improvement of left-sided weakness compared to prior- still with left drift but can hold LUE above head (could not earlier today), LLE 3-4/5 dorsiflexion/plantarflexion. Right groin puncture site soft without active bleeding or hematoma, firmness of right thigh marked by neurology.  Dr. Corliss Skains at bedside, discussed updates with patient and family. All questions answered and concerns addressed. Patient to stay in neuro ICU for overnight observation. New BP goal = 140-160 systolic. Discontinue home BP meds at this time- will reassess starting these in AM. Continue taking Brilinta 90 mg twice daily and Aspirin 81 mg once daily. Colace for BMs. INR pending. Further plans per [facility]/neurology/vascular surgery- appreciate and agree with management. NIR to follow.   Donna Boga Donna Quiros, PA-C 08/26/2019, 5:32 PM

## 2019-08-26 NOTE — Progress Notes (Signed)
Lab did not accept P2Y12 sent down earlier this morning, and said CBC results looked very different from last result. CBC and P2Y12 redrawn. P2Y12 walked down to lab.

## 2019-08-27 ENCOUNTER — Other Ambulatory Visit: Payer: Medicare HMO

## 2019-08-27 ENCOUNTER — Inpatient Hospital Stay (HOSPITAL_COMMUNITY): Payer: Medicare HMO

## 2019-08-27 ENCOUNTER — Ambulatory Visit: Payer: Self-pay

## 2019-08-27 ENCOUNTER — Inpatient Hospital Stay: Payer: Self-pay

## 2019-08-27 DIAGNOSIS — E785 Hyperlipidemia, unspecified: Secondary | ICD-10-CM

## 2019-08-27 DIAGNOSIS — N179 Acute kidney failure, unspecified: Secondary | ICD-10-CM

## 2019-08-27 DIAGNOSIS — E1159 Type 2 diabetes mellitus with other circulatory complications: Secondary | ICD-10-CM

## 2019-08-27 DIAGNOSIS — I639 Cerebral infarction, unspecified: Secondary | ICD-10-CM

## 2019-08-27 DIAGNOSIS — D649 Anemia, unspecified: Secondary | ICD-10-CM

## 2019-08-27 DIAGNOSIS — I10 Essential (primary) hypertension: Secondary | ICD-10-CM

## 2019-08-27 DIAGNOSIS — I6389 Other cerebral infarction: Secondary | ICD-10-CM

## 2019-08-27 DIAGNOSIS — I6521 Occlusion and stenosis of right carotid artery: Principal | ICD-10-CM

## 2019-08-27 LAB — ECHOCARDIOGRAM COMPLETE
Height: 61 in
Weight: 2416 oz

## 2019-08-27 LAB — POCT I-STAT 7, (LYTES, BLD GAS, ICA,H+H)
Acid-base deficit: 7 mmol/L — ABNORMAL HIGH (ref 0.0–2.0)
Bicarbonate: 17.2 mmol/L — ABNORMAL LOW (ref 20.0–28.0)
Calcium, Ion: 1.15 mmol/L (ref 1.15–1.40)
HCT: 18 % — ABNORMAL LOW (ref 36.0–46.0)
Hemoglobin: 6.1 g/dL — CL (ref 12.0–15.0)
O2 Saturation: 94 %
Patient temperature: 98.5
Potassium: 3.4 mmol/L — ABNORMAL LOW (ref 3.5–5.1)
Sodium: 144 mmol/L (ref 135–145)
TCO2: 18 mmol/L — ABNORMAL LOW (ref 22–32)
pCO2 arterial: 25.2 mmHg — ABNORMAL LOW (ref 32.0–48.0)
pH, Arterial: 7.442 (ref 7.350–7.450)
pO2, Arterial: 64 mmHg — ABNORMAL LOW (ref 83.0–108.0)

## 2019-08-27 LAB — GLUCOSE, CAPILLARY
Glucose-Capillary: 180 mg/dL — ABNORMAL HIGH (ref 70–99)
Glucose-Capillary: 191 mg/dL — ABNORMAL HIGH (ref 70–99)
Glucose-Capillary: 160 mg/dL — ABNORMAL HIGH (ref 70–99)
Glucose-Capillary: 178 mg/dL — ABNORMAL HIGH (ref 70–99)
Glucose-Capillary: 177 mg/dL — ABNORMAL HIGH (ref 70–99)
Glucose-Capillary: 146 mg/dL — ABNORMAL HIGH (ref 70–99)

## 2019-08-27 LAB — COMPREHENSIVE METABOLIC PANEL
ALT: 21 U/L (ref 0–44)
AST: 21 U/L (ref 15–41)
Albumin: 2.4 g/dL — ABNORMAL LOW (ref 3.5–5.0)
Alkaline Phosphatase: 36 U/L — ABNORMAL LOW (ref 38–126)
Anion gap: 8 (ref 5–15)
BUN: 21 mg/dL (ref 8–23)
CO2: 19 mmol/L — ABNORMAL LOW (ref 22–32)
Calcium: 7.6 mg/dL — ABNORMAL LOW (ref 8.9–10.3)
Chloride: 114 mmol/L — ABNORMAL HIGH (ref 98–111)
Creatinine, Ser: 1.12 mg/dL — ABNORMAL HIGH (ref 0.44–1.00)
GFR calc Af Amer: 58 mL/min — ABNORMAL LOW (ref >60)
GFR calc non Af Amer: 50 mL/min — ABNORMAL LOW (ref >60)
Glucose, Bld: 189 mg/dL — ABNORMAL HIGH (ref 70–99)
Potassium: 3.5 mmol/L (ref 3.5–5.1)
Sodium: 141 mmol/L (ref 135–145)
Total Bilirubin: 0.1 mg/dL — ABNORMAL LOW (ref 0.3–1.2)
Total Protein: 4.9 g/dL — ABNORMAL LOW (ref 6.5–8.1)

## 2019-08-27 LAB — CBC
HCT: 15.1 % — ABNORMAL LOW (ref 36.0–46.0)
Hemoglobin: 4.8 g/dL — CL (ref 12.0–15.0)
MCH: 29.1 pg (ref 26.0–34.0)
MCHC: 31.8 g/dL (ref 30.0–36.0)
MCV: 91.5 fL (ref 80.0–100.0)
Platelets: 182 10*3/uL (ref 150–400)
RBC: 1.65 MIL/uL — ABNORMAL LOW (ref 3.87–5.11)
RDW: 13.3 % (ref 11.5–15.5)
WBC: 11.6 10*3/uL — ABNORMAL HIGH (ref 4.0–10.5)
nRBC: 0.3 % — ABNORMAL HIGH (ref 0.0–0.2)

## 2019-08-27 LAB — PROTIME-INR
INR: 1.2 (ref 0.8–1.2)
Prothrombin Time: 14.4 seconds (ref 11.4–15.2)

## 2019-08-27 LAB — HEMOGLOBIN AND HEMATOCRIT, BLOOD
HCT: 16.3 % — ABNORMAL LOW (ref 36.0–46.0)
HCT: 16.1 % — ABNORMAL LOW (ref 36.0–46.0)
Hemoglobin: 5.1 g/dL — CL (ref 12.0–15.0)
Hemoglobin: 5 g/dL — CL (ref 12.0–15.0)

## 2019-08-27 LAB — FIBRINOGEN: Fibrinogen: 330 mg/dL (ref 210–475)

## 2019-08-27 MED ORDER — FUROSEMIDE 10 MG/ML IJ SOLN
40.0000 mg | Freq: Once | INTRAMUSCULAR | Status: AC
  Administered 2019-08-27: 40 mg via INTRAVENOUS
  Filled 2019-08-27: qty 4

## 2019-08-27 MED ORDER — PRO-STAT SUGAR FREE PO LIQD
30.0000 mL | Freq: Two times a day (BID) | ORAL | Status: DC
  Administered 2019-08-27 – 2019-09-04 (×15): 30 mL
  Filled 2019-08-27 (×14): qty 30

## 2019-08-27 MED ORDER — SODIUM CHLORIDE 0.9 % IV SOLN
510.0000 mg | INTRAVENOUS | Status: AC
  Administered 2019-08-27 – 2019-08-30 (×2): 510 mg via INTRAVENOUS
  Filled 2019-08-27 (×3): qty 17

## 2019-08-27 MED ORDER — JEVITY 1.2 CAL PO LIQD
1000.0000 mL | ORAL | Status: DC
  Administered 2019-08-27: 1000 mL
  Filled 2019-08-27 (×2): qty 1000

## 2019-08-27 MED ORDER — SODIUM CHLORIDE 0.9% FLUSH
10.0000 mL | INTRAVENOUS | Status: DC | PRN
  Administered 2019-09-14: 10 mL

## 2019-08-27 MED ORDER — VITAL HIGH PROTEIN PO LIQD: 1000.0000 mL | ORAL | Status: DC

## 2019-08-27 MED ORDER — SODIUM CHLORIDE 0.9% FLUSH
10.0000 mL | Freq: Two times a day (BID) | INTRAVENOUS | Status: DC
  Administered 2019-08-27 – 2019-08-28 (×3): 10 mL
  Administered 2019-08-28: 20 mL
  Administered 2019-08-29: 10 mL
  Administered 2019-08-29: 20 mL
  Administered 2019-08-30 – 2019-08-31 (×4): 10 mL
  Administered 2019-09-01: 20 mL
  Administered 2019-09-01 – 2019-09-09 (×15): 10 mL
  Administered 2019-09-10: 20 mL
  Administered 2019-09-10: 10 mL
  Administered 2019-09-11: 30 mL
  Administered 2019-09-11 – 2019-09-16 (×11): 10 mL
  Administered 2019-09-17: 20 mL
  Administered 2019-09-17: 10 mL
  Administered 2019-09-18 (×2): 30 mL
  Administered 2019-09-19 – 2019-10-11 (×43): 10 mL
  Administered 2019-10-12: 30 mL
  Administered 2019-10-12 – 2019-10-15 (×5): 10 mL
  Administered 2019-10-16: 30 mL
  Administered 2019-10-16: 10 mL
  Administered 2019-10-17: 20 mL
  Administered 2019-10-17: 10 mL
  Administered 2019-10-18: 30 mL
  Administered 2019-10-18 – 2019-10-19 (×2): 10 mL
  Administered 2019-10-19: 30 mL
  Administered 2019-10-20: 10 mL

## 2019-08-27 MED ORDER — DARBEPOETIN ALFA 40 MCG/0.4ML IJ SOSY
40.0000 ug | PREFILLED_SYRINGE | INTRAMUSCULAR | Status: DC
  Administered 2019-08-27: 40 ug via SUBCUTANEOUS
  Filled 2019-08-27: qty 0.4

## 2019-08-27 MED ORDER — POTASSIUM CHLORIDE 10 MEQ/50ML IV SOLN
10.0000 meq | INTRAVENOUS | Status: AC
  Administered 2019-08-27 (×2): 10 meq via INTRAVENOUS
  Filled 2019-08-27 (×2): qty 50

## 2019-08-27 MED ORDER — FAMOTIDINE IN NACL 20-0.9 MG/50ML-% IV SOLN
20.0000 mg | INTRAVENOUS | Status: DC
  Administered 2019-08-27 – 2019-08-30 (×4): 20 mg via INTRAVENOUS
  Filled 2019-08-27 (×4): qty 50

## 2019-08-27 MED ORDER — IPRATROPIUM-ALBUTEROL 0.5-2.5 (3) MG/3ML IN SOLN
3.0000 mL | Freq: Once | RESPIRATORY_TRACT | Status: AC
  Administered 2019-08-27: 3 mL via RESPIRATORY_TRACT
  Filled 2019-08-27: qty 3

## 2019-08-27 MED ORDER — IOHEXOL 350 MG/ML SOLN
100.0000 mL | Freq: Once | INTRAVENOUS | Status: AC | PRN
  Administered 2019-08-27: 100 mL via INTRAVENOUS

## 2019-08-27 MED ORDER — INSULIN ASPART 100 UNIT/ML ~~LOC~~ SOLN
0.0000 [IU] | SUBCUTANEOUS | Status: DC
  Administered 2019-08-27 – 2019-08-28 (×6): 3 [IU] via SUBCUTANEOUS
  Administered 2019-08-28: 5 [IU] via SUBCUTANEOUS
  Administered 2019-08-28: 2 [IU] via SUBCUTANEOUS
  Administered 2019-08-28: 5 [IU] via SUBCUTANEOUS
  Administered 2019-08-28: 8 [IU] via SUBCUTANEOUS
  Administered 2019-08-28: 5 [IU] via SUBCUTANEOUS
  Administered 2019-08-29: 3 [IU] via SUBCUTANEOUS
  Administered 2019-08-29: 11 [IU] via SUBCUTANEOUS
  Administered 2019-08-29: 8 [IU] via SUBCUTANEOUS
  Administered 2019-08-29 (×2): 3 [IU] via SUBCUTANEOUS
  Administered 2019-08-30: 2 [IU] via SUBCUTANEOUS
  Administered 2019-08-30 (×2): 3 [IU] via SUBCUTANEOUS
  Administered 2019-08-30: 2 [IU] via SUBCUTANEOUS
  Administered 2019-08-30 (×2): 3 [IU] via SUBCUTANEOUS
  Administered 2019-08-31: 8 [IU] via SUBCUTANEOUS
  Administered 2019-08-31: 5 [IU] via SUBCUTANEOUS
  Administered 2019-08-31: 11 [IU] via SUBCUTANEOUS
  Administered 2019-08-31 (×2): 5 [IU] via SUBCUTANEOUS
  Administered 2019-09-01 (×2): 2 [IU] via SUBCUTANEOUS
  Administered 2019-09-01: 3 [IU] via SUBCUTANEOUS
  Administered 2019-09-01 (×3): 5 [IU] via SUBCUTANEOUS
  Administered 2019-09-02 (×4): 3 [IU] via SUBCUTANEOUS
  Administered 2019-09-03: 8 [IU] via SUBCUTANEOUS
  Administered 2019-09-03: 2 [IU] via SUBCUTANEOUS
  Administered 2019-09-03: 8 [IU] via SUBCUTANEOUS
  Administered 2019-09-03: 3 [IU] via SUBCUTANEOUS
  Administered 2019-09-04 (×2): 2 [IU] via SUBCUTANEOUS
  Administered 2019-09-04 (×2): 3 [IU] via SUBCUTANEOUS
  Administered 2019-09-05: 5 [IU] via SUBCUTANEOUS
  Administered 2019-09-05: 8 [IU] via SUBCUTANEOUS
  Administered 2019-09-05: 3 [IU] via SUBCUTANEOUS
  Administered 2019-09-05: 5 [IU] via SUBCUTANEOUS
  Administered 2019-09-06: 3 [IU] via SUBCUTANEOUS
  Administered 2019-09-06: 2 [IU] via SUBCUTANEOUS
  Administered 2019-09-06: 5 [IU] via SUBCUTANEOUS
  Administered 2019-09-07 (×2): 2 [IU] via SUBCUTANEOUS
  Administered 2019-09-07 – 2019-09-08 (×2): 3 [IU] via SUBCUTANEOUS
  Administered 2019-09-08: 5 [IU] via SUBCUTANEOUS
  Administered 2019-09-08: 3 [IU] via SUBCUTANEOUS
  Administered 2019-09-08: 2 [IU] via SUBCUTANEOUS
  Administered 2019-09-08 – 2019-09-09 (×3): 3 [IU] via SUBCUTANEOUS
  Administered 2019-09-09 – 2019-09-10 (×3): 2 [IU] via SUBCUTANEOUS
  Administered 2019-09-10: 5 [IU] via SUBCUTANEOUS
  Administered 2019-09-10: 3 [IU] via SUBCUTANEOUS
  Administered 2019-09-10: 2 [IU] via SUBCUTANEOUS
  Administered 2019-09-10: 3 [IU] via SUBCUTANEOUS
  Administered 2019-09-11: 2 [IU] via SUBCUTANEOUS
  Administered 2019-09-11: 3 [IU] via SUBCUTANEOUS
  Administered 2019-09-11: 5 [IU] via SUBCUTANEOUS
  Administered 2019-09-11 (×4): 3 [IU] via SUBCUTANEOUS
  Administered 2019-09-12 – 2019-09-13 (×4): 2 [IU] via SUBCUTANEOUS
  Administered 2019-09-13: 3 [IU] via SUBCUTANEOUS
  Administered 2019-09-14 (×3): 5 [IU] via SUBCUTANEOUS
  Administered 2019-09-14: 3 [IU] via SUBCUTANEOUS
  Administered 2019-09-14 – 2019-09-15 (×3): 5 [IU] via SUBCUTANEOUS
  Administered 2019-09-15: 3 [IU] via SUBCUTANEOUS
  Administered 2019-09-15: 5 [IU] via SUBCUTANEOUS
  Administered 2019-09-15: 3 [IU] via SUBCUTANEOUS
  Administered 2019-09-15 (×3): 5 [IU] via SUBCUTANEOUS
  Administered 2019-09-16: 3 [IU] via SUBCUTANEOUS
  Administered 2019-09-16: 5 [IU] via SUBCUTANEOUS
  Administered 2019-09-16: 3 [IU] via SUBCUTANEOUS
  Administered 2019-09-16: 5 [IU] via SUBCUTANEOUS
  Administered 2019-09-17 (×2): 2 [IU] via SUBCUTANEOUS
  Administered 2019-09-17: 3 [IU] via SUBCUTANEOUS
  Administered 2019-09-17: 2 [IU] via SUBCUTANEOUS
  Administered 2019-09-18: 3 [IU] via SUBCUTANEOUS
  Administered 2019-09-18: 2 [IU] via SUBCUTANEOUS
  Administered 2019-09-18: 3 [IU] via SUBCUTANEOUS
  Administered 2019-09-18 (×2): 2 [IU] via SUBCUTANEOUS
  Administered 2019-09-18: 3 [IU] via SUBCUTANEOUS
  Administered 2019-09-18 – 2019-09-19 (×2): 2 [IU] via SUBCUTANEOUS
  Administered 2019-09-19 – 2019-09-20 (×4): 3 [IU] via SUBCUTANEOUS
  Administered 2019-09-20: 2 [IU] via SUBCUTANEOUS
  Administered 2019-09-20: 3 [IU] via SUBCUTANEOUS
  Administered 2019-09-20: 5 [IU] via SUBCUTANEOUS
  Administered 2019-09-20: 3 [IU] via SUBCUTANEOUS
  Administered 2019-09-20 – 2019-09-21 (×6): 2 [IU] via SUBCUTANEOUS
  Administered 2019-09-21: 3 [IU] via SUBCUTANEOUS
  Administered 2019-09-22 (×4): 2 [IU] via SUBCUTANEOUS
  Administered 2019-09-22: 3 [IU] via SUBCUTANEOUS
  Administered 2019-09-23 (×3): 2 [IU] via SUBCUTANEOUS
  Administered 2019-09-23: 3 [IU] via SUBCUTANEOUS
  Administered 2019-09-24: 2 [IU] via SUBCUTANEOUS
  Administered 2019-09-24: 3 [IU] via SUBCUTANEOUS
  Administered 2019-09-25 (×4): 2 [IU] via SUBCUTANEOUS
  Administered 2019-09-26 (×2): 3 [IU] via SUBCUTANEOUS
  Administered 2019-09-26: 2 [IU] via SUBCUTANEOUS
  Administered 2019-09-26 – 2019-09-27 (×2): 3 [IU] via SUBCUTANEOUS
  Administered 2019-09-27: 5 [IU] via SUBCUTANEOUS
  Administered 2019-09-27 (×2): 2 [IU] via SUBCUTANEOUS
  Administered 2019-09-27 – 2019-09-28 (×3): 3 [IU] via SUBCUTANEOUS
  Administered 2019-09-28: 2 [IU] via SUBCUTANEOUS
  Administered 2019-09-28 – 2019-09-29 (×5): 3 [IU] via SUBCUTANEOUS
  Administered 2019-09-29: 5 [IU] via SUBCUTANEOUS
  Administered 2019-09-29: 4 [IU] via SUBCUTANEOUS
  Administered 2019-09-29: 2 [IU] via SUBCUTANEOUS
  Administered 2019-09-29: 3 [IU] via SUBCUTANEOUS
  Administered 2019-09-29: 5 [IU] via SUBCUTANEOUS
  Administered 2019-09-30: 8 [IU] via SUBCUTANEOUS
  Administered 2019-09-30: 2 [IU] via SUBCUTANEOUS
  Administered 2019-09-30: 5 [IU] via SUBCUTANEOUS
  Administered 2019-09-30 (×2): 2 [IU] via SUBCUTANEOUS
  Administered 2019-09-30: 3 [IU] via SUBCUTANEOUS
  Administered 2019-10-01: 2 [IU] via SUBCUTANEOUS
  Administered 2019-10-01: 3 [IU] via SUBCUTANEOUS
  Administered 2019-10-01: 9 [IU] via SUBCUTANEOUS
  Administered 2019-10-01: 3 [IU] via SUBCUTANEOUS
  Administered 2019-10-01 – 2019-10-02 (×3): 2 [IU] via SUBCUTANEOUS
  Administered 2019-10-02 (×2): 3 [IU] via SUBCUTANEOUS
  Administered 2019-10-02: 2 [IU] via SUBCUTANEOUS
  Administered 2019-10-02: 3 [IU] via SUBCUTANEOUS
  Administered 2019-10-03 (×2): 5 [IU] via SUBCUTANEOUS
  Administered 2019-10-03 (×2): 2 [IU] via SUBCUTANEOUS

## 2019-08-27 NOTE — Progress Notes (Signed)
VASCULAR SURGERY:  I reviewed his CT scan from this morning.  There is no evidence of active bleeding.  The hematoma in the thigh and retroperitoneum is unchanged.  No plans for surgery at this point.  Waverly Ferrari, MD Office: [phone]

## 2019-08-27 NOTE — Chronic Care Management (AMB) (Signed)
  Chronic Care Management   Inpatient Admit Review Note  08/27/2019 Name: Donna [name] MRN: [id number] DOB: 1951/01/13  [name] is a 69 y.o. year old female who is a primary care patient of Arnette Felts, FNP. [name] is actively engaged with the embedded care management team in the primary care practice and is being followed by RN Case Manager for assistance with disease management and care coordination needs related to HTN and DMII.   [name] is currently admitted to the hospital for evaluation and treatment of stroke. Patient currently in ICU.  Plan: CM team will collaborate with [facility] and will follow patient post discharge.    Bevelyn Ngo, BSW, CDP Social Worker, Certified Dementia Practitioner [facility] Care Management [phone]

## 2019-08-27 NOTE — Progress Notes (Signed)
Called to bedside for wheezing. On exam, patient is more alert than prior this morning. She is following commands, clear speech and has no signs of distress.  No history of tobacco use or lung disease. Will give duoneb x 1.  CXR pending.  Fluid balance + 2.8 L this admission. Will give lasix x 1.

## 2019-08-27 NOTE — Consult Note (Addendum)
NAME:  Donna [name], MRN:  [id number], DOB:  02/01/1951, LOS: 2 ADMISSION DATE:  08/25/2019, CONSULTATION DATE:  08/27/2019 REFERRING MD:  Dr. Cheral Marker, CHIEF COMPLAINT:  Hypotension/ ABLA  Brief History   27 yoF with recent CVA in April 2020 with residual severe high grade MCA stenosis admitted 6/14 s/p RT ICA cervical/ petrous junction balloon angioplasty who developed lethargy and increasing left sided weakness found to have new watershead infarcts thought possibly related to hypoperfusion vs post procedure.  Neurology following.  Started on vasopressor for goal SBP 140-160 for cerebral perfusion.  Also found to have Hgb drop 13-> 6.9, new AKI, found to have right thigh hematoma and mild to moderate right iliac and inguinal retroperitoneal bleed.  Vascular following.  She is a Jehovah witness and has refused all blood products. PCCM consulted to help with blood pressure management and possible need for additional vasopressor requirements.   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  Was placed on ASA/ brillinta.  Monitored in Neuro ICU.  Noted to have developed hypotension, increased left sided weakness and lethargy on 6/15 am with Hgb drop from 13.3 to 6.9 with new AKI.  Post MRI/ MRA showed scattered small acute infarcts along the right MCA/ watershed territory suspected to be resultant either post procedure vs hypoperfusion.  Neurology was consulted with recommendations to keep SBP > 140 for cerebral perfusion.  A CT abd/ pelvis was obtained given concern for retroperitoneal bleed which showed a mild to moderate  amount of retroperitoneal hematoma in the right iliac and inguinal regiones with moderate size hematoma noted the the soft tissues anterior to the musculature of the right hip; no definite intrapertioneal hemorrhage. INR, PT, and platelets rechecked and were wnl.  Vascular surgery was consulted with plans to observe given bleeding was mostly into the muscle and tissue.  She was started on neosynephrine to keep SBP goal > 140-160 for cerebral perfusion.  PCCM consulted to help with vasopressor support.  S/p 1L bolus  Repeat Hgb now 6.9-> 7-> 5.1.   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    Significant Diagnostic Tests:   6/15 MRA/ MRI brain  >> 1. Scattered small acute infarcts along the right MCA/watershed territory. Solitary small acute infarct in the left parietal cortex and right cerebellum. 2. Improved right ICA patency at the petrous segment. 3. Known severe right M1 segment stenosis. 4. 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  6/15 CT a/p wo contrast >> 1. Mild to moderate amount of retroperitoneal hematoma is noted in the right iliac and inguinal regions. Moderate size hematoma is noted in the soft tissues anterior to the musculature of the right hip. No definite intraperitoneal hemorrhage is noted. These results will be called to the ordering clinician or representative by the Radiologist Assistant, and communication documented  in the PACS or zVision Dashboard. 2. Moderate size fat containing periumbilical hernia. Aortic Atherosclerosis   Micro Data:  6/10 SARS 2  >> neg 6/14 MRSA PCR >> neg  Antimicrobials:  6/14 cefazolin pre-op   Interim history/subjective:  Currently on neosynephrine 200 mcg/min via PIV  Patient currently denies any pain  Right thigh area remains soft  Objective   Blood pressure (!) 132/50, pulse (!) 103, temperature 98.3 F (36.8 C), temperature source Oral, resp. rate (!) 25, height 5' 1"  (1.549 m), weight 68.5 kg, SpO2 100 %.        Intake/Output Summary (Last 24 hours) at 08/27/2019 0116 Last data filed at 08/26/2019 2200 Gross per 24 hour  Intake 2149.95 ml  Output 1655 ml  Net 494.95 ml   Filed Weights   08/25/19 0705  Weight: 68.5 kg    Examination: General:  Elderly female lying in bed in NAD HEENT: MM pink/moist, pupils 3/reactive, mild intermittent upper airway transmitted wheezing Neuro: lethargic but able to wake up, answers simple questions, oriented to name, place, location, follows simple commands, 5/5 on RUE/ RLE and 4/5 LUE and weaker on LLE CV: rr, no murmur PULM:  Non labored, CTA  GI: soft, hypobs, ND, NT, foley  Extremities: warm/dry, right thigh/ groin area with some swelling but remains soft, some ecchymosis but area soft with marked boundaries, no LE edema  Skin: no rashes   Resolved Hospital Problem list    Assessment & Plan:   ABLA secondary to right thigh hematoma- mostly muscle and soft tissue which remains soft and mild to moderate retroperitoneal hematoma in the right iliac and inguinal region P:  - Spoke with patient who refuses ALL blood products including albumin.  Some concern in over patient has capacity to make decisions given her lethargy as this is a potential life threatening bleed.  Therefore, patient's son, Donna [name] at [phone], called by Dr. Duwayne Heck and updated.  He as well confirmed no to all blood products including albumin at this time.   - PICC to be placed giving increasing vasopressor requirements - Will trend CVP, goal > 4 while on pressors - Continue  neosynephrine, may need to add NE for SBP goal 120 (we spoke with Neuro who agreed to drop SBP goal from 140 to 120.  Higher SBP may exacerbate bleeding process.   - Repeat BMET, trend H/H q 4, and recheck coags - holding ASA/ brillinta - vascular will come reassess   s/p RT ICA cer/petrous junction balloon angioplasty for severe stenosis. Post-procedure with residual stenosis of approx 25 %. Severe stenosis of RT MCA M1 seg with improved flow post angioplasty Right MCA watershed territory infarct Small left frontal cortex and right cerebellum  P:  - Per Neuro IR and Neurology - Pending further stroke workup- TTE, lipid panel, A1c - Serial neuro exams - Spoke with Neurology, ok to decrease SBP goal to >120   AKI - baseline sCr 0.93 P:  - Strict I/Os, trend indices - Continue foley for now  Leukocytosis  P:  - likely reactive, remains afebrile, trend WBC/ fever curve, monitor clinically for now  DM- uncontrolled P:  - change to CBG q 4/ SSI moderate  - lantus 10 units daily, may need to increase, but is now NPO  Best practice:  Diet: NPO for now Pain/Anxiety/Delirium protocol (if indicated): n/a VAP protocol (if indicated): n/a DVT prophylaxis: SCDs GI prophylaxis: n/a Glucose control: CBG q 4 Mobility: BR Code Status: Full  Family Communication: Son,  [name] updated by Dr. Duwayne Heck by phone Disposition: ICU  Labs   CBC: Recent Labs  Lab 08/25/19 0715 08/26/19 0555 08/26/19 0732 08/26/19 2244  WBC 4.8 11.4* 11.8*  --   NEUTROABS 2.4 8.9* 8.9*  --   HGB 13.3 6.9* 7.0* 5.1*  HCT 42.2 20.4* 20.4* 16.3*  MCV 90.2 91.1 89.9  --   PLT 283 206 223  --     Basic Metabolic Panel: Recent Labs  Lab 08/26/19 0555  NA 134*  K 4.5  CL 103  CO2 17*  GLUCOSE 289*  BUN 31*  CREATININE 1.78*  CALCIUM 8.0*   GFR: Estimated Creatinine Clearance: 26.8 mL/min (A) (by C-G formula based on SCr of 1.78 mg/dL (H)). Recent Labs  Lab 08/25/19 0715 08/26/19 0555  08/26/19 0732  WBC 4.8 11.4* 11.8*    Liver Function Tests: No results for input(s): AST, ALT, ALKPHOS, BILITOT, PROT, ALBUMIN in the last 168 hours. No results for input(s): LIPASE, AMYLASE in the last 168 hours. No results for input(s): AMMONIA in the last 168 hours.  ABG No results found for: PHART, PCO2ART, PO2ART, HCO3, TCO2, ACIDBASEDEF, O2SAT   Coagulation Profile: Recent Labs  Lab 08/26/19 1715  INR 1.1    Cardiac Enzymes: No results for input(s): CKTOTAL, CKMB, CKMBINDEX, TROPONINI in the last 168 hours.  HbA1C: Hemoglobin A1C  Date/Time Value Ref Range Status  10/12/2017 12:00 AM 9.7  Final   Hgb A1c MFr Bld  Date/Time Value Ref Range Status  05/22/2019 01:12 PM 11.2 (H) 4.8 - 5.6 % Final    Comment:             Prediabetes: 5.7 - 6.4          Diabetes: >6.4          Glycemic control for adults with diabetes: <7.0   11/27/2018 07:27 AM 11.3 (H) 4.8 - 5.6 % Final    Comment:    (NOTE)         Prediabetes: 5.7 - 6.4         Diabetes: >6.4         Glycemic control for adults with diabetes: <7.0     CBG: Recent Labs  Lab 08/25/19 2150 08/26/19 0805 08/26/19 1203 08/26/19 1654 08/26/19 2154  GLUCAP 306* 277* 213* 235* 140*    Review of Systems:   unable  Past Medical History  She,  has a past medical history of Anemia, Arthritis, Hyperlipidemia, Hypertension, Sleep apnea, Stroke ([place]), and Type II diabetes mellitus ([place]).   Surgical History    Past Surgical History:  Procedure Laterality Date  . COLONOSCOPY    . IR ANGIO INTRA EXTRACRAN SEL COM CAROTID INNOMINATE BILAT MOD SED  10/18/2018  . IR ANGIO INTRA EXTRACRAN SEL COM CAROTID INNOMINATE BILAT MOD SED  08/18/2019  . IR ANGIO VERTEBRAL SEL SUBCLAVIAN INNOMINATE UNI R MOD SED  08/18/2019  . IR ANGIO VERTEBRAL SEL VERTEBRAL BILAT MOD SED  10/18/2018  . IR ANGIO VERTEBRAL SEL VERTEBRAL UNI L MOD SED  08/18/2019  . IR US GUIDE VASC ACCESS RIGHT  10/18/2018  . IR US GUIDE VASC ACCESS RIGHT  08/18/2019   . MYOMECTOMY    . RADIOLOGY WITH ANESTHESIA N/A 08/18/2019   Procedure: STENTING;  Surgeon: Luanne Bras, MD;  Location: [facility];  Service: Radiology;  Laterality: N/A;  . RADIOLOGY WITH ANESTHESIA N/A 08/25/2019   Procedure: STENTING;  Surgeon: Luanne Bras, MD;  Location: [facility];  Service: Radiology;  Laterality: N/A;  Social History   reports that she has never smoked. She has never used smokeless tobacco. She reports that she does not drink alcohol and does not use drugs.   Family History   Her family history includes Diabetes in her father and sister; Stroke in her mother. There is no history of Breast cancer.   Allergies No Known Allergies   Home Medications  Prior to Admission medications   Medication Sig Start Date End Date Taking? Authorizing Provider  amLODipine (NORVASC) 10 MG tablet Take 1 tablet (10 mg total) by mouth daily. 05/12/19  Yes Minette Brine, FNP  aspirin EC 81 MG tablet Take 81 mg by mouth every evening.   Yes [provider]  Calcium Carbonate-Vitamin D (CALCIUM-D PO) Take 2 tablets by mouth daily at 12 noon.    Yes [provider]  carvedilol (COREG) 6.25 MG tablet TAKE 1 TABLET(6.25 MG) BY MOUTH TWICE DAILY Patient taking differently: Take 6.25 mg by mouth 2 (two) times daily with a meal.  07/31/19  Yes Minette Brine, FNP  hydrALAZINE (APRESOLINE) 50 MG tablet Take 1 tablet (50 mg total) by mouth 2 (two) times daily. 06/16/19 09/14/19 Yes Elouise Munroe, MD  Insulin Degludec-Liraglutide (XULTOPHY) 100-3.6 UNIT-MG/ML SOPN Inject 30 Units into the skin daily. 08/14/19  Yes Minette Brine, FNP  telmisartan-hydrochlorothiazide (MICARDIS HCT) 40-12.5 MG tablet Take 1 tablet by mouth daily. 07/31/19  Yes Minette Brine, FNP  ticagrelor (BRILINTA) 90 MG TABS tablet Take 1 tablet (90 mg total) by mouth 2 (two) times daily. 06/12/19  Yes Elouise Munroe, MD  vitamin C (ASCORBIC ACID) 250 MG tablet Take 500 mg by mouth daily at 12 noon.   Yes  [provider]  blood glucose meter kit and supplies KIT Dispense based on patient and insurance preference. Use up to four times daily as directed. (FOR ICD-9 250.00, 250.01). 05/10/18   Minette Brine, FNP  Blood Glucose Monitoring Suppl (TRUE METRIX METER) w/Device KIT 1 each by Does not apply route in the morning, at noon, in the evening, and at bedtime. 05/22/19   Minette Brine, FNP  Evolocumab (REPATHA SURECLICK) 445 MG/ML SOAJ Inject 140 mg into the skin every 14 (fourteen) days. 06/30/19   Elouise Munroe, MD  glucose blood (TRUE METRIX BLOOD GLUCOSE TEST) test strip Check blood sugar 4 times a day before meals and bedtime 05/22/19   Minette Brine, FNP  Insulin Glargine-Lixisenatide (SOLIQUA) 100-33 UNT-MCG/ML SOPN Inject 25 Units into the skin daily. Patient taking differently: Inject 30 Units into the skin daily.  07/14/19   Minette Brine, FNP  Multiple Vitamin (MULTIVITAMIN PO) Take 1 tablet by mouth daily at 12 noon.     [provider]  Omega-3 Fatty Acids (OMEGA-3 PLUS PO) Take 2 capsules by mouth daily at 12 noon.    [provider]     Critical care time: 52 mins     Kennieth Rad, MSN, AGACNP-BC [facility] 08/27/2019, 2:34 AM  See Shea Evans for personal pager PCCM on call pager [phone]

## 2019-08-27 NOTE — Progress Notes (Signed)
Pt has been at max dose of Neo since 2227 and not meeting SBP goal of 140-160. Neuro IR and Neuro have been paged. Repeat H/H has been sent, results pending. 1L NS bolus running. RN will continue to monitor.

## 2019-08-27 NOTE — Progress Notes (Addendum)
NAME:  Donna [name], MRN:  [id number], DOB:  02/01/1951, LOS: 2 ADMISSION DATE:  08/25/2019, CONSULTATION DATE:  08/27/2019 REFERRING MD:  Dr. Cheral Marker, CHIEF COMPLAINT:  Hypotension/ ABLA  Brief History   27 yoF with recent CVA in April 2020 with residual severe high grade MCA stenosis admitted 6/14 s/p RT ICA cervical/ petrous junction balloon angioplasty who developed lethargy and increasing left sided weakness found to have new watershead infarcts thought possibly related to hypoperfusion vs post procedure.  Neurology following.  Started on vasopressor for goal SBP 140-160 for cerebral perfusion.  Also found to have Hgb drop 13-> 6.9, new AKI, found to have right thigh hematoma and mild to moderate right iliac and inguinal retroperitoneal bleed.  Vascular following.  She is a Jehovah witness and has refused all blood products. PCCM consulted to help with blood pressure management and possible need for additional vasopressor requirements.   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  Was placed on ASA/ brillinta.  Monitored in Neuro ICU.  Noted to have developed hypotension, increased left sided weakness and lethargy on 6/15 am with Hgb drop from 13.3 to 6.9 with new AKI.  Post MRI/ MRA showed scattered small acute infarcts along the right MCA/ watershed territory suspected to be resultant either post procedure vs hypoperfusion.  Neurology was consulted with recommendations to keep SBP > 140 for cerebral perfusion.  A CT abd/ pelvis was obtained given concern for retroperitoneal bleed which showed a mild to moderate  amount of retroperitoneal hematoma in the right iliac and inguinal regiones with moderate size hematoma noted the the soft tissues anterior to the musculature of the right hip; no definite intrapertioneal hemorrhage. INR, PT, and platelets rechecked and were wnl.  Vascular surgery was consulted with plans to observe given bleeding was mostly into the muscle and tissue.  She was started on neosynephrine to keep SBP goal > 140-160 for cerebral perfusion.  PCCM consulted to help with vasopressor support.  S/p 1L bolus  Repeat Hgb now 6.9-> 7-> 5.1.   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    Significant Diagnostic Tests:   6/15 MRA/ MRI brain  >> 1. Scattered small acute infarcts along the right MCA/watershed territory. Solitary small acute infarct in the left parietal cortex and right cerebellum. 2. Improved right ICA patency at the petrous segment. 3. Known severe right M1 segment stenosis. 4. 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  6/15 CT a/p wo contrast >> 1. Mild to moderate amount of retroperitoneal hematoma is noted in the right iliac and inguinal regions. Moderate size hematoma is noted in the soft tissues anterior to the musculature of the right hip. No definite intraperitoneal hemorrhage is noted. These results will be called to the ordering clinician or representative by the Radiologist Assistant, and communication documented  in the PACS or zVision Dashboard. 2. Moderate size fat containing periumbilical hernia. Aortic Atherosclerosis   Micro Data:  6/10 SARS 2  >> neg 6/14 MRSA PCR >> neg  Antimicrobials:  6/14 cefazolin pre-op   Interim history/subjective:  + 1.7 L I/O, net + 3.1 L this admission Tachycardia, mild overnight.  Maintaining BP parameters on 30 mcg/min of neosynephrine Continues to oxygenate well on room air.  Encephalopathic, able to engage and answer orientation questions but unclear if capacity to make decisions at this time.   Objective   Blood pressure (!) 124/56, pulse 92, temperature 98.5 F (36.9 C), temperature source Axillary, resp. rate (!) 21, height 5\' 1"  (1.549 m), weight 68.5 kg, SpO2 92 %.        Intake/Output Summary (Last 24 hours) at 08/27/2019 0736 Last data filed at 08/27/2019 0600 Gross per 24 hour  Intake 4583.01 ml  Output 2800 ml  Net 1783.01 ml   Filed Weights   08/25/19 0705  Weight: 68.5 kg    Examination: General:  Elderly female, encephalopathic, mild distress HEENT: MM pink/moist, pupils 3/reactive Neuro: PERRL, Lethargic but responds to voice, states name and place.  Follows commands on the right. W/d on the left upper ext and and left lower ext.  Restless in bed.  CV: rr, no murmur, no edema. Warm and well perfused PULM:  Lungs clear to auscultation, symmetric expansion.   GI: soft, hypobs, ND, NT, foley  Extremities: warm/dry, proximal right thigh with ecchymosis and mild swelling, soft. Skin: no rashes   Resolved Hospital Problem list    Assessment & Plan:   Acute blood loss anemia - CTA Abd/Pelvis with hematoma of the right groin and retroperitoneum with expansion to the right rectus femoris expansion. No active bleeding P:  - Patient is Jehovah's witness. Multiple discussions with family and patient during the admission who have expressed their religious beliefs and will not accept ANY blood products including albumin.   - son, 08/27/19 at [phone], called by [name] this am.  Please see his note for details.  - PT/INR and fibrinogen normal - PICC to be placed giving  increasing vasopressor requirements - Will trend CVP, goal > 4 while on pressors - Continue neosynephrine SBP goal 140  - Recommend not trending H/H unless goals of care change and family wishes to accept blood products.  - Continue to hold ASA/ brillinta - If evidence of bleeding, would place femstop to right femoral.    s/p RT ICA cer/petrous junction balloon angioplasty for severe stenosis. Post-procedure with residual stenosis of approx 25 %. Severe stenosis of RT MCA M1 seg with improved flow post angioplasty Right MCA watershed territory infarct Small left frontal cortex and right cerebellum  P:  - Per Neuro IR and Neurology - Pending further stroke workup - TTE, lipid panel, A1c  AKI  - etiology: Pre-renal in setting of anemia - baseline sCr 0.93 P:  - Continue supportive care  Leukocytosis  P:  - likely reactive - Monitor off abx.  High risk for aspiration and monitor closely for signs/symptoms  DM, type II - uncontrolled P:  - change to CBG q 4/ SSI moderate  - lantus 10 units daily - Hold on adjustments with basal insulin now that patient is NPO.   Hypokalemia P: -Replaced  Best practice:  Diet: NPO  Pain/Anxiety/Delirium protocol (if indicated): n/a VAP protocol (if indicated): n/a DVT prophylaxis: SCDs only in setting of acute anemia/blood loss GI prophylaxis: Pepcid Glucose control: CBG q  4 Mobility: BR Code Status: Full  Family Communication: Son, [name] updated by Dr Estanislado Pandy.  Awaiting call back, per son to call back in an hour,  to establish further goals of care.  Disposition: ICU. High risk for further deterioration including respiratory failure and cardiac arrest.  Addendum:    Jehovah's witness liaison contacted. Ok for EPO and IV Iron only.  Dr Estanislado Pandy and myself discussed plans with patient's son [name] and Sister (who is at bedside).  We will initiate EPO and IV Iron, knowing that these are not immediate.  Goals of care discussions were  initiated, including life support.  [name] is calling his brother to discuss CODE Status. At this time she will remain a FULL CODE.  He is aware that we are no longer checking serial Hgb to minimize blood draws.   Without blood products and ability to correct anemia, if hgb were to continue to trend down patient at risk for cardiac arrest.  He is going to discuss with his brother because he agreed he does not want to cause harm/suffering if we are unable to fix the underlying problem.  Patient exam repeated, continues to be lethargic, but arouses and able to recognize her sister. No need for emergent intubation at this time.  Hemodynamics stable.    Labs   CBC: Recent Labs  Lab 08/25/19 0715 08/25/19 0715 08/26/19 0555 08/26/19 0732 08/26/19 2244 08/27/19 0230 08/27/19 0529  WBC 4.8  --  11.4* 11.8*  --  11.6*  --   NEUTROABS 2.4  --  8.9* 8.9*  --   --   --   HGB 13.3   < > 6.9* 7.0* 5.1* 4.8* 5.0*  HCT 42.2   < > 20.4* 20.4* 16.3* 15.1* 16.1*  MCV 90.2  --  91.1 89.9  --  91.5  --   PLT 283  --  206 223  --  182  --    < > = values in this interval not displayed.    Basic Metabolic Panel: Recent Labs  Lab 08/26/19 0555 08/27/19 0230  NA 134* 141  K 4.5 3.5  CL 103 114*  CO2 17* 19*  GLUCOSE 289* 189*  BUN 31* 21  CREATININE 1.78* 1.12*  CALCIUM 8.0* 7.6*   GFR: Estimated Creatinine Clearance: 42.6 mL/min (A) (by C-G formula based on SCr of 1.12 mg/dL (H)). Recent Labs  Lab 08/25/19 0715 08/26/19 0555 08/26/19 0732 08/27/19 0230  WBC 4.8 11.4* 11.8* 11.6*    Liver Function Tests: Recent Labs  Lab 08/27/19 0230  AST 21  ALT 21  ALKPHOS 36*  BILITOT 0.1*  PROT 4.9*  ALBUMIN 2.4*   No results for input(s): LIPASE, AMYLASE in the last 168 hours. No results for input(s): AMMONIA in the last 168 hours.  ABG No results found for: PHART, PCO2ART, PO2ART, HCO3, TCO2, ACIDBASEDEF, O2SAT   Coagulation Profile: Recent Labs  Lab 08/26/19 1715 08/27/19 0230  INR  1.1 1.2    Cardiac Enzymes: No results for input(s): CKTOTAL, CKMB, CKMBINDEX, TROPONINI in the last 168 hours.  HbA1C: Hemoglobin A1C  Date/Time Value Ref Range Status  10/12/2017 12:00 AM 9.7  Final   Hgb A1c MFr Bld  Date/Time Value Ref Range Status  05/22/2019 01:12 PM 11.2 (H) 4.8 - 5.6 % Final    Comment:             Prediabetes: 5.7 - 6.4          Diabetes: >6.4  Glycemic control for adults with diabetes: <7.0   11/27/2018 07:27 AM 11.3 (H) 4.8 - 5.6 % Final    Comment:    (NOTE)         Prediabetes: 5.7 - 6.4         Diabetes: >6.4         Glycemic control for adults with diabetes: <7.0     CBG: Recent Labs  Lab 08/26/19 1203 08/26/19 1654 08/26/19 2154 08/27/19 0441 08/27/19 0729  GLUCAP 213* 235* 140* 180* 191*     Critical care time:  64     Earney Hamburg, ACNP [facility]  After hours pager: [phone]

## 2019-08-27 NOTE — Procedures (Signed)
Cortrak  Person Inserting Tube:  Francina Beery C, RD Tube Type:  Cortrak - 43 inches Tube Location:  Left nare Initial Placement:  Stomach Secured by: Bridle Technique Used to Measure Tube Placement:  Documented cm marking at nare/ corner of mouth Cortrak Secured At:  62 cm    Cortrak Tube Team Note:  Consult received to place a Cortrak feeding tube.   No x-ray is required. RN may begin using tube.   If the tube becomes dislodged please keep the tube and contact the Cortrak team at [email or web address] (password [email or web address]) for replacement.  If after hours and replacement cannot be delayed, place a NG tube and confirm placement with an abdominal x-ray.    Haley Roza P., RD, LDN, CNSC See AMiON for contact information    

## 2019-08-27 NOTE — Progress Notes (Signed)
The chaplain was consulted as a result of the patient's religious beliefs that restricted the use of blood. The patient is Jehovah Witness and does not receive blood products, this was confirmed by her HCPOA which is her son. The patient had a dangerously low hemoglobin and medical treatment called for a blood transfusion. The chaplain spoke with the [facility] Committee [facility]) Mr. Kizzie Ide and George Hugh, which relayed to the chaplain the preferred treatment is Erythropoietin, and IV Iron. The chaplain relayed this information to the interdisciplinary team which then was able to consult the family in regards to this treatment plan.   The family at bedside is a sister who is not Jehovah Witness. The sister discussed with the chaplain that she does not agree with the sister's choice but will still honor it. The chaplain offered prayer for the sister. The son is expected to arrive later today.The chaplain is available for support if needed to be in contact with the [facility], or to support the family.  Donna [name] Chaplain Resident For questions concerning this note please contact me by pager [phone]

## 2019-08-27 NOTE — Progress Notes (Signed)
Referring Physician(s): Melvenia Beam  Supervising Physician: Luanne Bras  Patient Status:  [facility] - In-pt  Chief Complaint: None  Subjective:  Right ICA cervical/petrous junction stenosis s/p revascularization using balloon angioplasty via right radial and right femoral approach 08/25/2019 by Dr. Estanislado Pandy. Patient laying in bed, drowsy. She responds to name and is oriented to person and place, able to identity her sister who is at bedside. Can spontaneously move right side but no spontaneous movements of left side- demonstrates neglect of left. Right groin and right radial puncture sites c/d/i. Hgb down to 4.8 this AM.   Allergies: Patient has no known allergies.  Medications: Prior to Admission medications   Medication Sig Start Date End Date Taking? Authorizing Provider  amLODipine (NORVASC) 10 MG tablet Take 1 tablet (10 mg total) by mouth daily. 05/12/19  Yes Minette Brine, FNP  aspirin EC 81 MG tablet Take 81 mg by mouth every evening.   Yes [provider]  Calcium Carbonate-Vitamin D (CALCIUM-D PO) Take 2 tablets by mouth daily at 12 noon.    Yes [provider]  carvedilol (COREG) 6.25 MG tablet TAKE 1 TABLET(6.25 MG) BY MOUTH TWICE DAILY Patient taking differently: Take 6.25 mg by mouth 2 (two) times daily with a meal.  07/31/19  Yes Minette Brine, FNP  hydrALAZINE (APRESOLINE) 50 MG tablet Take 1 tablet (50 mg total) by mouth 2 (two) times daily. 06/16/19 09/14/19 Yes Elouise Munroe, MD  Insulin Degludec-Liraglutide (XULTOPHY) 100-3.6 UNIT-MG/ML SOPN Inject 30 Units into the skin daily. 08/14/19  Yes Minette Brine, FNP  telmisartan-hydrochlorothiazide (MICARDIS HCT) 40-12.5 MG tablet Take 1 tablet by mouth daily. 07/31/19  Yes Minette Brine, FNP  ticagrelor (BRILINTA) 90 MG TABS tablet Take 1 tablet (90 mg total) by mouth 2 (two) times daily. 06/12/19  Yes Elouise Munroe, MD  vitamin C (ASCORBIC ACID) 250 MG tablet Take 500 mg by mouth daily at 12  noon.   Yes [provider]  blood glucose meter kit and supplies KIT Dispense based on patient and insurance preference. Use up to four times daily as directed. (FOR ICD-9 250.00, 250.01). 05/10/18   Minette Brine, FNP  Blood Glucose Monitoring Suppl (TRUE METRIX METER) w/Device KIT 1 each by Does not apply route in the morning, at noon, in the evening, and at bedtime. 05/22/19   Minette Brine, FNP  Evolocumab (REPATHA SURECLICK) 415 MG/ML SOAJ Inject 140 mg into the skin every 14 (fourteen) days. 06/30/19   Elouise Munroe, MD  glucose blood (TRUE METRIX BLOOD GLUCOSE TEST) test strip Check blood sugar 4 times a day before meals and bedtime 05/22/19   Minette Brine, FNP  Insulin Glargine-Lixisenatide (SOLIQUA) 100-33 UNT-MCG/ML SOPN Inject 25 Units into the skin daily. Patient taking differently: Inject 30 Units into the skin daily.  07/14/19   Minette Brine, FNP  Multiple Vitamin (MULTIVITAMIN PO) Take 1 tablet by mouth daily at 12 noon.     [provider]  Omega-3 Fatty Acids (OMEGA-3 PLUS PO) Take 2 capsules by mouth daily at 12 noon.    [provider]     Vital Signs: BP 132/66   Pulse (!) 110   Temp 99.3 F (37.4 C) (Axillary)   Resp (!) 23   Ht 5' 1"  (1.549 m)   Wt 151 lb (68.5 kg)   SpO2 100%   BMI 28.53 kg/m   Physical Exam Vitals and nursing note reviewed.  Constitutional:      General: She is not  in acute distress. Pulmonary:     Effort: Pulmonary effort is normal. No respiratory distress.  Skin:    General: Skin is warm and dry.     Comments: Right groin puncture site soft without active bleeding or hematoma. Right radial puncture site soft without active bleeding or hematoma; firmness of right thigh marked by neurology- appears less firm today.  Neurological:     Comments: Appears drowsy, responds to name, oriented to person and place, able to identity her sister who is at bedside. PERRL bilaterally. Demonstrates right gaze preference,  unable to cross midline. Can spontaneously move right side but no spontaneous movements of left side- demonstrates neglect of left. Distal pulses (DPs) palpable bilaterally with Doppler.     Imaging: CT ABDOMEN PELVIS WO CONTRAST  Result Date: 08/26/2019 CLINICAL DATA:  Abdominal distension. EXAM: CT ABDOMEN AND PELVIS WITHOUT CONTRAST TECHNIQUE: Multidetector CT imaging of the abdomen and pelvis was performed following the standard protocol without IV contrast. COMPARISON:  None. FINDINGS: Lower chest: No acute abnormality. Hepatobiliary: No focal liver abnormality is seen. No gallstones, gallbladder wall thickening, or biliary dilatation. Pancreas: Unremarkable. No pancreatic ductal dilatation or surrounding inflammatory changes. Spleen: Normal in size without focal abnormality. Adrenals/Urinary Tract: Adrenal glands and kidneys are unremarkable. No hydronephrosis or renal obstruction is noted. Foley catheter is noted within urinary bladder. Stomach/Bowel: Stomach is within normal limits. Appendix appears normal. No evidence of bowel wall thickening, distention, or inflammatory changes. Vascular/Lymphatic: Atherosclerosis of abdominal aorta is noted without aneurysm formation. There does appear to be a mild to moderate amount of retroperitoneal hematoma in the right iliac and inguinal regions. Moderate size hematoma is noted in the soft tissues anterior to the musculature of the right hip. No definite intraperitoneal hemorrhage is noted. Reproductive: Status post hysterectomy. No adnexal masses. Other: Moderate size fat containing periumbilical hernia is noted. No ascites is noted. Musculoskeletal: No acute or significant osseous findings. IMPRESSION: 1. Mild to moderate amount of retroperitoneal hematoma is noted in the right iliac and inguinal regions. Moderate size hematoma is noted in the soft tissues anterior to the musculature of the right hip. No definite intraperitoneal hemorrhage is noted. These  results will be called to the ordering clinician or representative by the Radiologist Assistant, and communication documented in the PACS or zVision Dashboard. 2. Moderate size fat containing periumbilical hernia. Aortic Atherosclerosis (ICD10-I70.0). Electronically Signed   By: Marijo Conception M.D.   On: 08/26/2019 16:31   CT HEAD WO CONTRAST  Result Date: 08/27/2019 CLINICAL DATA:  Evaluate for source of hemorrhage EXAM: CT HEAD WITHOUT CONTRAST TECHNIQUE: Contiguous axial images were obtained from the base of the skull through the vertex without intravenous contrast. COMPARISON:  Brain MRI from yesterday FINDINGS: Brain: Small infarcts by brain MRI are largely occult on this study. Remote perforator infarct at the right basal ganglia and lateral left cerebellum. Mild cerebral volume loss. No hemorrhage, hydrocephalus, or masslike finding. Vascular: No hyperdense vessel or unexpected calcification. Skull: Normal. Negative for fracture or focal lesion. Sinuses/Orbits: Chronic left maxillary sinusitis with complete opacification. Patchy mucosal thickening elsewhere. IMPRESSION: 1. Known acute infarcts that are underestimated by CT. No detected progression or acute hemorrhage. 2. 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 Electronically Signed   By: Monte Fantasia M.D.   On: 08/27/2019 06:40   MR ANGIO HEAD WO CONTRAST  Result Date: 08/26/2019 CLINICAL DATA:  Right ICA angioplasty with left-sided weakness EXAM: MRI HEAD WITHOUT CONTRAST MRA HEAD WITHOUT CONTRAST TECHNIQUE: Multiplanar, multiecho  pulse sequences of the brain and surrounding structures were obtained without intravenous contrast. Angiographic images of the head were obtained using MRA technique without contrast. COMPARISON:  10/09/2018 FINDINGS: MRI HEAD FINDINGS Brain: Small (subcentimeter) scattered cortical and white matter infarcts along the right cerebral convexity and in the high left parietal cortex. On the right these are  in a roughly watershed distribution. Small acute infarct in the upper right cerebellum. Small remote left cerebellar infarct Remote perforator infarct at the right basal ganglia. Mild chronic small vessel ischemic change in the cerebral white matter. No hydrocephalus or collection. Vascular: See below Skull and upper cervical spine: Normal marrow signal Sinuses/Orbits: Chronic left maxillary sinusitis with complete opacification also seen in 2020. MRA HEAD FINDINGS The right ICA is smaller than the left. Only mild narrowing at the right cervical petrous segment, an improvement. Severe right MCA stenosis with patent downstream vasculature. No left-sided or anterior cerebral stenosis or branch occlusion. High-grade narrowing at the non dominant right V4 segment. Moderate mid basilar stenosis. Patent bilateral PCA. IMPRESSION: 1. Scattered small acute infarcts along the right MCA/watershed territory. Solitary small acute infarct in the left parietal cortex and right cerebellum. 2. Improved right ICA patency at the petrous segment. 3. Known severe right M1 segment stenosis. 4. 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 Electronically Signed   By: Monte Fantasia M.D.   On: 08/26/2019 11:05   MR BRAIN WO CONTRAST  Result Date: 08/26/2019 CLINICAL DATA:  Right ICA angioplasty with left-sided weakness EXAM: MRI HEAD WITHOUT CONTRAST MRA HEAD WITHOUT CONTRAST TECHNIQUE: Multiplanar, multiecho pulse sequences of the brain and surrounding structures were obtained without intravenous contrast. Angiographic images of the head were obtained using MRA technique without contrast. COMPARISON:  10/09/2018 FINDINGS: MRI HEAD FINDINGS Brain: Small (subcentimeter) scattered cortical and white matter infarcts along the right cerebral convexity and in the high left parietal cortex. On the right these are in a roughly watershed distribution. Small acute infarct in the upper right cerebellum. Small remote left cerebellar  infarct Remote perforator infarct at the right basal ganglia. Mild chronic small vessel ischemic change in the cerebral white matter. No hydrocephalus or collection. Vascular: See below Skull and upper cervical spine: Normal marrow signal Sinuses/Orbits: Chronic left maxillary sinusitis with complete opacification also seen in 2020. MRA HEAD FINDINGS The right ICA is smaller than the left. Only mild narrowing at the right cervical petrous segment, an improvement. Severe right MCA stenosis with patent downstream vasculature. No left-sided or anterior cerebral stenosis or branch occlusion. High-grade narrowing at the non dominant right V4 segment. Moderate mid basilar stenosis. Patent bilateral PCA. IMPRESSION: 1. Scattered small acute infarcts along the right MCA/watershed territory. Solitary small acute infarct in the left parietal cortex and right cerebellum. 2. Improved right ICA patency at the petrous segment. 3. Known severe right M1 segment stenosis. 4. 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 Electronically Signed   By: Monte Fantasia M.D.   On: 08/26/2019 11:05   [place] EKG SITE RITE  Result Date: 08/27/2019 If Site Rite image not attached, placement could not be confirmed due to current cardiac rhythm.  CT Angio Abd/Pel w/ and/or w/o  Result Date: 08/27/2019 CLINICAL DATA:  Acute blood loss anemia.  Recent arteriogram. EXAM: CTA ABDOMEN AND PELVIS WITHOUT AND WITH CONTRAST TECHNIQUE: Multidetector CT imaging of the abdomen and pelvis was performed using the standard protocol during bolus administration of intravenous contrast. Multiplanar reconstructed images and MIPs were obtained and reviewed to evaluate the  vascular anatomy. CONTRAST:  174m OMNIPAQUE IOHEXOL 350 MG/ML SOLN COMPARISON:  Yesterday FINDINGS: VASCULAR Aorta: Diffuse atheromatous plaque.  Negative for aneurysm Celiac: Plaque at the origin moderate narrowing. No branch occlusion. SMA: Diffusely patent without stenosis or  dissection Renals: Accessory left upper pole renal artery. There is atherosclerotic plaque without proximal high-grade stenosis. IMA: Patent Inflow: Atheromatous plaque. Proximal Outflow: No visible irregularity of common femoral or visible superficial femoral arteries. Veins: Hemorrhage tracks along the external iliac vein extends into the pelvis, overall small volume for the procedure. Review of the MIP images confirms the above findings. NON-VASCULAR Lower chest: Partially covered opacity at the right lower lobe with trace left pleural effusion. The airways appear cuffed and there are a few thickened interlobular septae a. Hepatobiliary: No focal liver abnormality.Contrast excretion into the gallbladder. Pancreas: Unremarkable. Spleen: Unremarkable. Adrenals/Urinary Tract: Negative adrenals. No hydronephrosis or stone. Unremarkable bladder. Stomach/Bowel:  No obstruction. No appendicitis. Lymphatic: No mass or adenopathy.  Fatty umbilical hernia. Reproductive:No pathologic findings. Other: No ascites or pneumoperitoneum. Musculoskeletal: No acute abnormalities. Lower lumbar disc bulging and facet spurring. Case discussed with Dr. DScot Dock fortunately the thigh remain soft. IMPRESSION: 1. No visualized active bleeding. There is unchanged and relatively mild blood products in the right groin and retroperitoneum. There was right rectus femoris expansion and hemorrhage on the preceding abdominal CT, less well covered today. 2. Mild pulmonary edema. Electronically Signed   By: JMonte FantasiaM.D.   On: 08/27/2019 07:02    Labs:  CBC: Recent Labs    08/25/19 0715 08/25/19 0715 08/26/19 0555 08/26/19 0555 08/26/19 0732 08/26/19 2244 08/27/19 0230 08/27/19 0529  WBC 4.8  --  11.4*  --  11.8*  --  11.6*  --   HGB 13.3   < > 6.9*   < > 7.0* 5.1* 4.8* 5.0*  HCT 42.2   < > 20.4*   < > 20.4* 16.3* 15.1* 16.1*  PLT 283  --  206  --  223  --  182  --    < > = values in this interval not displayed.     COAGS: Recent Labs    11/27/18 0728 08/18/19 0705 08/26/19 1715 08/27/19 0230  INR 0.9 0.9 1.1 1.2    BMP: Recent Labs    05/22/19 1312 08/18/19 0705 08/26/19 0555 08/27/19 0230  NA 141 136 134* 141  K 4.4 3.6 4.5 3.5  CL 99 101 103 114*  CO2 29 25 17* 19*  GLUCOSE 131* 211* 289* 189*  BUN 18 26* 31* 21  CALCIUM 10.2 9.7 8.0* 7.6*  CREATININE 0.98 0.93 1.78* 1.12*  GFRNONAA 60 >60 29* 50*  GFRAA 69 >60 33* 58*    LIVER FUNCTION TESTS: Recent Labs    10/07/18 1259 05/22/19 1312 08/27/19 0230  BILITOT 0.3 0.3 0.1*  AST 17 15 21   ALT 20 19 21   ALKPHOS 73 74 36*  PROT 7.3 7.5 4.9*  ALBUMIN 4.1 4.0 2.4*    Assessment and Plan:  Right ICA cervical/petrous junction stenosis s/p revascularization using balloon angioplasty via right radial and right femoral approach 08/25/2019 by Dr. DEstanislado Pandy Patient with continued neurologic decline, drowsy but arousable, no spontaneous movements of left side- due to combination of both ABLA and right MCA high-grade stenosis. Thorough discussions with both patient's sister (at bedside) and her son, KEzekiel Slocumb(via telephone) regarding use of blood products, which both patient and her son (next of kin) have refused multiple times secondary to religious beliefs- they will not accept ANY  blood products other than IV iron and Erythropoietin- again discussed that these will not work instantly. Also had discussions with [name] via telephone regarding Code status- at this time, he agrees for intubation but would like time to discuss CPR with his brother before proceeding. Continue to hold Brilinta/Aspirin. Do not recommend trending H/H at this time. Further plans per CCM/neurology/TRH/vascular surgery- appreciate and agree with management. NIR to follow.   Electronically Signed: Earley Abide, PA-C 08/27/2019, 10:39 AM   I spent a total of 35 Minutes at the the patient's bedside AND on the patient's hospital floor or unit, greater than 50%  of which was counseling/coordinating care for right ICA stenosis s/p revascularization.

## 2019-08-27 NOTE — Progress Notes (Signed)
SBP goal now 120-140

## 2019-08-27 NOTE — Progress Notes (Signed)
Patient ID: Donna [name], female   DOB: 1950-08-08, 68 y.o.   MRN: [id number] INR. Overnight events reviewed. Vascular surgery and neuro notes noted. Most recent HB 5.  VS BP in the 120s to 140s. Systolic. RT groin and thigh softer, and stable in girth.. Neurologically drowsy though  arousable. and appropriately responsive to simple commands when awake. Plegic on the left with lt sided neglect.. Distal pulses dopplerable. Discussed with son,[name] regarding his moms condition. Informed him of further potential irreversible total organ failure due to lack of oxygen due to the low hemoglobin, including cardiac arrest and death. Suggestion of packed  RBC transfusion was again mentioned. He responded that he would call back. S.Shilpa Bushee MD

## 2019-08-27 NOTE — Progress Notes (Addendum)
VASCULAR SURGERY ASSESSMENT & PLAN:   RIGHT THIGH AND RETROPERITONEAL HEMATOMA: This is a very difficult situation in that the patient's hemoglobin has drifted down to 5.1.  I had a long discussion with her about receiving blood and explained that she would likely not survive without receiving blood.  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  Her exam has not changed significantly.  She has moderate swelling in the thigh and no significant hematoma in the groin itself.  She did have some retroperitoneal hematoma on her noncontrast CT scan.  Her blood pressure has been stable and she does not complain of significant pain to suggest ongoing bleeding.   I am concerned that if we operate to explore the femoral artery she will lose even more blood and her hemoglobin is already critically low.  We could consider repeating her CT scan to see if there is progression of her retroperitoneal hematoma.  I discussed the case with critical care medicine and they would prefer to hold off on a repeat CT scan at this point.  She does have chronic kidney disease and this will have to be done without contrast.  I think her best option at this point is to agree to receive blood however I was not successful in convincing her of this.  She is 2,758 cc positive and has been receiving significant fluid boluses to maintain her blood pressure given that she had an intracranial angioplasty.  Certainly a significant component of the drop in hemoglobin is dilutional.   Although it is not clear if she is having ongoing bleeding I think the only other option would be to place a Femostop on the right groin cannulation site, which I have ordered.  SUBJECTIVE:   Slightly lethargic.  PHYSICAL EXAM:   Vitals:   08/27/19 0215 08/27/19 0230 08/27/19 0245 08/27/19 0300  BP:      Pulse:      Resp: (!) 21 20 20  (!) 21  Temp:      TempSrc:      SpO2:      Weight:      Height:       No  change in the swelling in the right thigh.  No significant hematoma at the cannulation site which suggest the need bleeding was into the retroperitoneum and into the thigh.  LABS:   Lab Results  Component Value Date   WBC 11.8 (H) 08/26/2019   HGB 5.1 (LL) 08/26/2019   HCT 16.3 (L) 08/26/2019   MCV 89.9 08/26/2019   PLT 223 08/26/2019   Lab Results  Component Value Date   CREATININE 1.78 (H) 08/26/2019   Lab Results  Component Value Date   INR 1.1 08/26/2019   CBG (last 3)  Recent Labs    08/26/19 1203 08/26/19 1654 08/26/19 2154  GLUCAP 213* 235* 140*    PROBLEM LIST:    Principal Problem:   Internal carotid artery stenosis, right Active Problems:   Essential hypertension   Hyperlipidemia   Cerebral infarction (HCC)   Type 2 diabetes mellitus with vascular disease (HCC)   AKI (acute kidney injury) ([place])   Anemia   CURRENT MEDS:   . Chlorhexidine Gluconate Cloth  6 each Topical Daily  . docusate sodium  100 mg Oral BID  . insulin aspart  0-15 Units Subcutaneous Q4H  . insulin glargine  10 Units Subcutaneous Daily  . sodium chloride flush  10-40 mL Intracatheter Q12H  Waverly Ferrari Office: [phone] 08/27/2019

## 2019-08-27 NOTE — Consult Note (Signed)
   [facility] Inpatient Consult   08/27/2019  DESIA SABAN 09-20-1950 [id number]  [facility] ACO Patient:  Embedded Team, [employer] H[employer]  Alerted from the Embedded Team Social Worker of patient's admission. Patient will have the transition of care call conducted by the primary care provider. This patient is also in an Embedded practice which has a chronic disease management Embedded Care Management team.  Plan:  Will follow up with [facility] Embedded Care Management team for progress and disposition updates as when and if  appropriate. Patient currently in ICU.  Please contact for further questions,  Charlesetta Shanks, RN BSN CCM [facility]  [phone] business mobile phone Toll free office [phone]  Fax number: [phone] [email or web address]

## 2019-08-27 NOTE — Progress Notes (Signed)
°  Echocardiogram 2D Echocardiogram has been performed.  Donna [name] 08/27/2019, 11:46 AM

## 2019-08-27 NOTE — Progress Notes (Addendum)
PROGRESS NOTE  Donna [name] WUJ:[id number] DOB: April 19, 1950 DOA: 08/25/2019 PCP: Donna [name], [name], FNP   LOS: 2 days   Brief narrative: As per HPI and previous provider,  Donna [name] is an 69 y.o. female with history of  CVA (2020); OSA; DM type II; essential HTN; and hyperlipidemia who was admitted by interventional radiology on 6/14 status post R ICA angioplasty/stent placement.  The patient reported chronic left-sided weakness, unchanged from prior.   She denied any acute issues.  However, the nurse caring for her reported that she had acute changes in her mentation with significant weakness on the left side.   Assessment/Plan:  Principal Problem:   Internal carotid artery stenosis, right Active Problems:   Essential hypertension   Hyperlipidemia   Cerebral infarction (HCC)   Type 2 diabetes mellitus with vascular disease (HCC)   AKI (acute kidney injury) (HCC)   Anemia  Right ICA stenosis -status post revascularization of Right  ICA stenosis via angioplasty. Vascular surgery on board.  Acute CVA Patient with prior h/o CVA with left-sided deficits.Despite right ICA angioplasty, weakness progressed with altered mental status. MRI/MRA confirmed a small solitary infract in Left parietal cortex and Right cerebellum as well as scattered small acute infarcts along the R MCA/watershed territory.  Neurology has been consulted and recommended full stroke work-up and avoiding hypotension.  Continue physical therapy, occupational therapy Brilinta currently on hold.  CT angiogram of the abdomen and pelvis with no active bleeding but right groin and retroperitoneal hematoma.  Neurology on board and recommend discussing with the family regarding goals of care.   Acute blood loss anemia  Likely secondary to groin and retroperitoneal hematoma.  Hemoglobin of 5.0 today.  Patient is Jehovah's Witness.  I had an extensive discussion with the patient's sister at bedside.  Patient will be considered  erythropoietin and iron infusion in the meantime.  Discussed about the potential poor wound healing, hypoxia,and cardiac arrest due to severe anemia.  Acute kidney injury. -Likely acute tubular necrosis secondary to acute blood loss anemia and hypoperfusion.  Creatinine of 1.1 today.  Will closely monitor with BMP.   Essential HTN -Continue permissive hypertension. Was on  Norvasc, Creg, Hydralazine, Micardis HCT- is on hold at this time  Hyperlipidemia -Patient is on repatha for statin-intolerant (myopathy).lipid profile at this time abnormal.  DM type II A1c was 11.2 on 3/11. On [place]Xultophy and Soliqua as an outpatient,on hold. continue on SSI, basal added yesterday at Lantus 10 units .  Will closely monitor blood glucose levels.   Leukocytosis likely reactive.  Monitor off antibiotic.  Hypokalemia.  Replenished.  Potassium of 3.5 today.  VTE Prophylaxis: Sequential compression device  Code Status: Full code  Family Communication: Spoke with the patient's sister at bedside.  Status is: Inpatient  Remains inpatient appropriate because:Ongoing diagnostic testing needed not appropriate for outpatient work up, Unsafe d/c plan, Inpatient level of care appropriate due to severity of illness and Severe anemia requiring blood transfusion.   Dispo: The patient is from: Home              Anticipated d/c is to: Home              Anticipated d/c date is: > 3 days              Patient currently is not medically stable to d/c.    Consultants:  Neurology  Critical care  Procedures:  Right ICA angioplasty  Antibiotics:  . none  Anti-infectives (From admission,  onward)   Start     Dose/Rate Route Frequency Ordered Stop   08/25/19 0723  ceFAZolin (ANCEF) IVPB 2g/100 mL premix        2 g 200 mL/hr over 30 Minutes Intravenous 60 min pre-op 08/25/19 0723 08/25/19 0930     Subjective: Today, patient was seen and examined at bedside.  Appears to be somnolent but answering few  questions.  Denies overt pain.  Objective: Vitals:   08/27/19 0615 08/27/19 0630  BP:    Pulse: (!) 112 92  Resp: (!) 27 (!) 21  Temp:    SpO2: 95% 92%    Intake/Output Summary (Last 24 hours) at 08/27/2019 0726 Last data filed at 08/27/2019 0600 Gross per 24 hour  Intake 4583.01 ml  Output 2800 ml  Net 1783.01 ml   Filed Weights   08/25/19 0705  Weight: 68.5 kg   Body mass index is 28.53 kg/m.   Physical Exam: GENERAL: Patient is mildly somnolent but answering few questions,. Not in obvious distress.  On nasal cannula oxygen. HENT: Pallor.  Pupils equally reactive to light. Oral mucosa is moist NECK: is supple, no gross swelling noted. CHEST: Mildly wheezy, diminished breath sounds bilaterally. CVS: S1 and S2 heard, no murmur. Regular rate and rhythm.  ABDOMEN: Soft, non-tender, bowel sounds are present. EXTREMITIES: Left hemiplegia.  Able to slightly move left upper extremity.  Right thigh with marking.  No obvious discoloration. CNS: Left-sided hemiparesis.  Following few commands  SKIN: warm and dry without rashes.  Data Review: I have personally reviewed the following laboratory data and studies,  CBC: Recent Labs  Lab 08/25/19 0715 08/25/19 0715 08/26/19 0555 08/26/19 0732 08/26/19 2244 08/27/19 0230 08/27/19 0529  WBC 4.8  --  11.4* 11.8*  --  11.6*  --   NEUTROABS 2.4  --  8.9* 8.9*  --   --   --   HGB 13.3   < > 6.9* 7.0* 5.1* 4.8* 5.0*  HCT 42.2   < > 20.4* 20.4* 16.3* 15.1* 16.1*  MCV 90.2  --  91.1 89.9  --  91.5  --   PLT 283  --  206 223  --  182  --    < > = values in this interval not displayed.   Basic Metabolic Panel: Recent Labs  Lab 08/26/19 0555 08/27/19 0230  NA 134* 141  K 4.5 3.5  CL 103 114*  CO2 17* 19*  GLUCOSE 289* 189*  BUN 31* 21  CREATININE 1.78* 1.12*  CALCIUM 8.0* 7.6*   Liver Function Tests: Recent Labs  Lab 08/27/19 0230  AST 21  ALT 21  ALKPHOS 36*  BILITOT 0.1*  PROT 4.9*  ALBUMIN 2.4*   No results  for input(s): LIPASE, AMYLASE in the last 168 hours. No results for input(s): AMMONIA in the last 168 hours. Cardiac Enzymes: No results for input(s): CKTOTAL, CKMB, CKMBINDEX, TROPONINI in the last 168 hours. BNP (last 3 results) No results for input(s): BNP in the last 8760 hours.  ProBNP (last 3 results) No results for input(s): PROBNP in the last 8760 hours.  CBG: Recent Labs  Lab 08/26/19 0805 08/26/19 1203 08/26/19 1654 08/26/19 2154 08/27/19 0441  GLUCAP 277* 213* 235* 140* 180*   Recent Results (from the past 240 hour(s))  SARS CORONAVIRUS 2 (TAT 6-24 HRS) Nasopharyngeal Nasopharyngeal Swab     Status: None   Collection Time: 08/21/19 12:45 PM   Specimen: Nasopharyngeal Swab  Result Value Ref Range Status   SARS  Coronavirus 2 NEGATIVE NEGATIVE Final    Comment: (NOTE) SARS-CoV-2 target nucleic acids are NOT DETECTED.  The SARS-CoV-2 RNA is generally detectable in upper and lower respiratory specimens during the acute phase of infection. Negative results do not preclude SARS-CoV-2 infection, do not rule out co-infections with other pathogens, and should not be used as the sole basis for treatment or other patient management decisions. Negative results must be combined with clinical observations, patient history, and epidemiological information. The expected result is Negative.  Fact Sheet for Patients: HairSlick.no  Fact Sheet for Healthcare Providers: quierodirigir.com  This test is not yet approved or cleared by the [place] FDA and  has been authorized for detection and/or diagnosis of SARS-CoV-2 by FDA under an Emergency Use Authorization (EUA). This EUA will remain  in effect (meaning this test can be used) for the duration of the COVID-19 declaration under Se ction 564(b)(1) of the Act, 21 U.S.C. section 360bbb-3(b)(1), unless the authorization is terminated or revoked sooner.  Performed at  [facility], [address], [place], [place] [address]   MRSA PCR Screening     Status: None   Collection Time: 08/25/19  3:42 PM   Specimen: Nasopharyngeal  Result Value Ref Range Status   MRSA by PCR NEGATIVE NEGATIVE Final    Comment:        The GeneXpert MRSA Assay (FDA approved for NASAL specimens only), is one component of a comprehensive MRSA colonization surveillance program. It is not intended to diagnose MRSA infection nor to guide or monitor treatment for MRSA infections. Performed at [facility], [address], [place], [place] [address]      Studies: CT ABDOMEN PELVIS WO CONTRAST  Result Date: 08/26/2019 CLINICAL DATA:  Abdominal distension. EXAM: CT ABDOMEN AND PELVIS WITHOUT CONTRAST TECHNIQUE: Multidetector CT imaging of the abdomen and pelvis was performed following the standard protocol without IV contrast. COMPARISON:  None. FINDINGS: Lower chest: No acute abnormality. Hepatobiliary: No focal liver abnormality is seen. No gallstones, gallbladder wall thickening, or biliary dilatation. Pancreas: Unremarkable. No pancreatic ductal dilatation or surrounding inflammatory changes. Spleen: Normal in size without focal abnormality. Adrenals/Urinary Tract: Adrenal glands and kidneys are unremarkable. No hydronephrosis or renal obstruction is noted. Foley catheter is noted within urinary bladder. Stomach/Bowel: Stomach is within normal limits. Appendix appears normal. No evidence of bowel wall thickening, distention, or inflammatory changes. Vascular/Lymphatic: Atherosclerosis of abdominal aorta is noted without aneurysm formation. There does appear to be a mild to moderate amount of retroperitoneal hematoma in the right iliac and inguinal regions. Moderate size hematoma is noted in the soft tissues anterior to the musculature of the right hip. No definite intraperitoneal hemorrhage is noted. Reproductive: Status post hysterectomy. No adnexal masses. Other: Moderate  size fat containing periumbilical hernia is noted. No ascites is noted. Musculoskeletal: No acute or significant osseous findings. IMPRESSION: 1. Mild to moderate amount of retroperitoneal hematoma is noted in the right iliac and inguinal regions. Moderate size hematoma is noted in the soft tissues anterior to the musculature of the right hip. No definite intraperitoneal hemorrhage is noted. These results will be called to the ordering clinician or representative by the Radiologist Assistant, and communication documented in the PACS or zVision Dashboard. 2. Moderate size fat containing periumbilical hernia. Aortic Atherosclerosis (ICD10-I70.0). Electronically Signed   By: Lupita Raider M.D.   On: 08/26/2019 16:31   CT HEAD WO CONTRAST  Result Date: 08/27/2019 CLINICAL DATA:  Evaluate for source of hemorrhage EXAM:  CT HEAD WITHOUT CONTRAST TECHNIQUE: Contiguous axial images were obtained from the base of the skull through the vertex without intravenous contrast. COMPARISON:  Brain MRI from yesterday FINDINGS: Brain: Small infarcts by brain MRI are largely occult on this study. Remote perforator infarct at the right basal ganglia and lateral left cerebellum. Mild cerebral volume loss. No hemorrhage, hydrocephalus, or masslike finding. Vascular: No hyperdense vessel or unexpected calcification. Skull: Normal. Negative for fracture or focal lesion. Sinuses/Orbits: Chronic left maxillary sinusitis with complete opacification. Patchy mucosal thickening elsewhere. IMPRESSION: 1. Known acute infarcts that are underestimated by CT. No detected progression or acute hemorrhage. 2. 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 Electronically Signed   By: Marnee Spring M.D.   On: 08/27/2019 06:40   MR ANGIO HEAD WO CONTRAST  Result Date: 08/26/2019 CLINICAL DATA:  Right ICA angioplasty with left-sided weakness EXAM: MRI HEAD WITHOUT CONTRAST MRA HEAD WITHOUT CONTRAST TECHNIQUE: Multiplanar, multiecho pulse  sequences of the brain and surrounding structures were obtained without intravenous contrast. Angiographic images of the head were obtained using MRA technique without contrast. COMPARISON:  10/09/2018 FINDINGS: MRI HEAD FINDINGS Brain: Small (subcentimeter) scattered cortical and white matter infarcts along the right cerebral convexity and in the high left parietal cortex. On the right these are in a roughly watershed distribution. Small acute infarct in the upper right cerebellum. Small remote left cerebellar infarct Remote perforator infarct at the right basal ganglia. Mild chronic small vessel ischemic change in the cerebral white matter. No hydrocephalus or collection. Vascular: See below Skull and upper cervical spine: Normal marrow signal Sinuses/Orbits: Chronic left maxillary sinusitis with complete opacification also seen in 2020. MRA HEAD FINDINGS The right ICA is smaller than the left. Only mild narrowing at the right cervical petrous segment, an improvement. Severe right MCA stenosis with patent downstream vasculature. No left-sided or anterior cerebral stenosis or branch occlusion. High-grade narrowing at the non dominant right V4 segment. Moderate mid basilar stenosis. Patent bilateral PCA. IMPRESSION: 1. Scattered small acute infarcts along the right MCA/watershed territory. Solitary small acute infarct in the left parietal cortex and right cerebellum. 2. Improved right ICA patency at the petrous segment. 3. Known severe right M1 segment stenosis. 4. 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 Electronically Signed   By: Marnee Spring M.D.   On: 08/26/2019 11:05   MR BRAIN WO CONTRAST  Result Date: 08/26/2019 CLINICAL DATA:  Right ICA angioplasty with left-sided weakness EXAM: MRI HEAD WITHOUT CONTRAST MRA HEAD WITHOUT CONTRAST TECHNIQUE: Multiplanar, multiecho pulse sequences of the brain and surrounding structures were obtained without intravenous contrast. Angiographic images of the  head were obtained using MRA technique without contrast. COMPARISON:  10/09/2018 FINDINGS: MRI HEAD FINDINGS Brain: Small (subcentimeter) scattered cortical and white matter infarcts along the right cerebral convexity and in the high left parietal cortex. On the right these are in a roughly watershed distribution. Small acute infarct in the upper right cerebellum. Small remote left cerebellar infarct Remote perforator infarct at the right basal ganglia. Mild chronic small vessel ischemic change in the cerebral white matter. No hydrocephalus or collection. Vascular: See below Skull and upper cervical spine: Normal marrow signal Sinuses/Orbits: Chronic left maxillary sinusitis with complete opacification also seen in 2020. MRA HEAD FINDINGS The right ICA is smaller than the left. Only mild narrowing at the right cervical petrous segment, an improvement. Severe right MCA stenosis with patent downstream vasculature. No left-sided or anterior cerebral stenosis or branch occlusion. High-grade narrowing at the non dominant  right V4 segment. Moderate mid basilar stenosis. Patent bilateral PCA. IMPRESSION: 1. Scattered small acute infarcts along the right MCA/watershed territory. Solitary small acute infarct in the left parietal cortex and right cerebellum. 2. Improved right ICA patency at the petrous segment. 3. Known severe right M1 segment stenosis. 4. 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 Electronically Signed   By: Marnee Spring M.D.   On: 08/26/2019 11:05   [place] EKG SITE RITE  Result Date: 08/27/2019 If Site Rite image not attached, placement could not be confirmed due to current cardiac rhythm.  CT Angio Abd/Pel w/ and/or w/o  Result Date: 08/27/2019 CLINICAL DATA:  Acute blood loss anemia.  Recent arteriogram. EXAM: CTA ABDOMEN AND PELVIS WITHOUT AND WITH CONTRAST TECHNIQUE: Multidetector CT imaging of the abdomen and pelvis was performed using the standard protocol during bolus administration  of intravenous contrast. Multiplanar reconstructed images and MIPs were obtained and reviewed to evaluate the vascular anatomy. CONTRAST:  OMNIPAQUE IOHEXOL 350 MG/ML SOLN COMPARISON:  Yesterday FINDINGS: VASCULAR Aorta: Diffuse atheromatous plaque.  Negative for aneurysm Celiac: Plaque at the origin moderate narrowing. No branch occlusion. SMA: Diffusely patent without stenosis or dissection Renals: Accessory left upper pole renal artery. There is atherosclerotic plaque without proximal high-grade stenosis. IMA: Patent Inflow: Atheromatous plaque. Proximal Outflow: No visible irregularity of common femoral or visible superficial femoral arteries. Veins: Hemorrhage tracks along the external iliac vein extends into the pelvis, overall small volume for the procedure. Review of the MIP images confirms the above findings. NON-VASCULAR Lower chest: Partially covered opacity at the right lower lobe with trace left pleural effusion. The airways appear cuffed and there are a few thickened interlobular septae a. Hepatobiliary: No focal liver abnormality.Contrast excretion into the gallbladder. Pancreas: Unremarkable. Spleen: Unremarkable. Adrenals/Urinary Tract: Negative adrenals. No hydronephrosis or stone. Unremarkable bladder. Stomach/Bowel:  No obstruction. No appendicitis. Lymphatic: No mass or adenopathy.  Fatty umbilical hernia. Reproductive:No pathologic findings. Other: No ascites or pneumoperitoneum. Musculoskeletal: No acute abnormalities. Lower lumbar disc bulging and facet spurring. Case discussed with Dr. Edilia Bo, fortunately the thigh remain soft. IMPRESSION: 1. No visualized active bleeding. There is unchanged and relatively mild blood products in the right groin and retroperitoneum. There was right rectus femoris expansion and hemorrhage on the preceding abdominal CT, less well covered today. 2. Mild pulmonary edema. Electronically Signed   By: Marnee Spring M.D.   On: 08/27/2019 07:02       Joycelyn Das, MD  [facility] 08/27/2019

## 2019-08-27 NOTE — Chronic Care Management (AMB) (Signed)
Chronic Care Management   Follow Up Note   08/27/2019 Name: Donna [name] MRN: [id number] DOB: August 18, 1950  Referred by: Minette Brine, FNP Reason for referral : Chronic Care Management (FU Call - DM/RA)   Donna [name] is a 69 y.o. year old female who is a primary care patient of Minette Brine, [place]. The CCM team was consulted for assistance with chronic disease management and care coordination needs.    Review of patient status, including review of consultants reports, relevant laboratory and other test results, and collaboration with appropriate care team members and the patient's provider was performed as part of comprehensive patient evaluation and provision of chronic care management services.    SDOH (Social Determinants of Health) assessments performed: no See Care Plan activities for detailed interventions related to [facility])   Reviewed chart in preparation to contact patient. Noted patient was admitted to [facility] on 08/25/19 with dx: Internal carotid artery stenosis, right and remains to be inpatient.     Facility-Administered Encounter Medications as of 08/26/2019  Medication  . 0.9 %  sodium chloride infusion  . 0.9 %  sodium chloride infusion  . 0.9 %  sodium chloride infusion  . acetaminophen (TYLENOL) tablet 650 mg   Or  . acetaminophen (TYLENOL) 160 MG/5ML solution 650 mg   Or  . acetaminophen (TYLENOL) suppository 650 mg  . Chlorhexidine Gluconate Cloth 2 % PADS 6 each  . Darbepoetin Alfa (ARANESP) injection 40 mcg  . docusate sodium (COLACE) capsule 100 mg  . famotidine (PEPCID) IVPB 20 mg premix  . ferumoxytol (FERAHEME) 510 mg in sodium chloride 0.9 % 100 mL IVPB  . insulin aspart (novoLOG) injection 0-15 Units  . insulin glargine (LANTUS) injection 10 Units  . [COMPLETED] iohexol (OMNIPAQUE) 350 MG/ML injection 100 mL  . nitroGLYCERIN 1 mg/10 mL (100 mcg/mL) - IR/CATH LAB  . [EXPIRED] phenylephrine (NEOSYNEPHRINE) 10-0.9 MG/250ML-%  infusion  . phenylephrine (NEOSYNEPHRINE) 10-0.9 MG/250ML-% infusion  . [EXPIRED] potassium chloride 10 mEq in 50 mL *CENTRAL LINE* IVPB  . Radial Cocktail (nitroglycerin/verapamil/heparin) for IR  . [COMPLETED] sodium chloride 0.9 % bolus 1,000 mL  . sodium chloride flush (NS) 0.9 % injection 10-40 mL  . sodium chloride flush (NS) 0.9 % injection 10-40 mL  . [DISCONTINUED] 0.9 %  sodium chloride infusion (Manually program via Guardrails IV Fluids)  . [DISCONTINUED] amLODipine (NORVASC) tablet 10 mg  . [DISCONTINUED] aspirin chewable tablet 81 mg  . [DISCONTINUED] aspirin chewable tablet 81 mg  . [DISCONTINUED] eptifibatide (INTEGRILIN) injection  . [DISCONTINUED] heparin sodium (porcine) injection  . [DISCONTINUED] insulin aspart (novoLOG) injection 0-15 Units  . [DISCONTINUED] irbesartan (AVAPRO) tablet 150 mg  . [DISCONTINUED] ticagrelor (BRILINTA) tablet 90 mg  . [DISCONTINUED] ticagrelor (BRILINTA) tablet 90 mg   Outpatient Encounter Medications as of 08/26/2019  Medication Sig Note  . amLODipine (NORVASC) 10 MG tablet Take 1 tablet (10 mg total) by mouth daily.   Marland Kitchen aspirin EC 81 MG tablet Take 81 mg by mouth every evening.   . blood glucose meter kit and supplies KIT Dispense based on patient and insurance preference. Use up to four times daily as directed. (FOR ICD-9 250.00, 250.01). 06/13/2018: Bought ReliON meter from [employer]    . Blood Glucose Monitoring Suppl (TRUE METRIX METER) w/Device KIT 1 each by Does not apply route in the morning, at noon, in the evening, and at bedtime.   . Calcium Carbonate-Vitamin D (CALCIUM-D PO) Take 2 tablets by mouth daily at 12 noon.    Marland Kitchen  carvedilol (COREG) 6.25 MG tablet TAKE 1 TABLET(6.25 MG) BY MOUTH TWICE DAILY (Patient taking differently: Take 6.25 mg by mouth 2 (two) times daily with a meal. )   . Evolocumab (REPATHA SURECLICK) 653 MG/ML SOAJ Inject 140 mg into the skin every 14 (fourteen) days. 08/06/2019: Pt has not started med   . glucose  blood (TRUE METRIX BLOOD GLUCOSE TEST) test strip Check blood sugar 4 times a day before meals and bedtime   . hydrALAZINE (APRESOLINE) 50 MG tablet Take 1 tablet (50 mg total) by mouth 2 (two) times daily.   . Insulin Degludec-Liraglutide (XULTOPHY) 100-3.6 UNIT-MG/ML SOPN Inject 30 Units into the skin daily.   . Insulin Glargine-Lixisenatide (SOLIQUA) 100-33 UNT-MCG/ML SOPN Inject 25 Units into the skin daily. (Patient taking differently: Inject 30 Units into the skin daily. )   . Multiple Vitamin (MULTIVITAMIN PO) Take 1 tablet by mouth daily at 12 noon.    . Omega-3 Fatty Acids (OMEGA-3 PLUS PO) Take 2 capsules by mouth daily at 12 noon.   Marland Kitchen telmisartan-hydrochlorothiazide (MICARDIS HCT) 40-12.5 MG tablet Take 1 tablet by mouth daily.   . ticagrelor (BRILINTA) 90 MG TABS tablet Take 1 tablet (90 mg total) by mouth 2 (two) times daily.   . vitamin C (ASCORBIC ACID) 250 MG tablet Take 500 mg by mouth daily at 12 noon.      Objective:  Lab Results  Component Value Date   HGBA1C 11.2 (H) 05/22/2019   HGBA1C 11.3 (H) 11/27/2018   HGBA1C 12.3 (H) 10/07/2018   Lab Results  Component Value Date   MICROALBUR 80 01/08/2019   LDLCALC 132 (H) 05/22/2019   CREATININE 1.12 (H) 08/27/2019   BP Readings from Last 3 Encounters:  08/27/19 (!) 117/53  08/18/19 (!) 120/56  08/18/19 (!) 150/70   Plan:   A care management team member will reach out to the patient upon her discharge home to assess for CCM needs.   Barb Merino, RN, BSN, CCM Care Management Coordinator [facility]  Direct Phone: [phone]

## 2019-08-27 NOTE — Progress Notes (Signed)
CRITICAL VALUE ALERT  Critical Value:  Hgb 5.1   Date & Time Notied: 08/27/2019 0104  Provider Notified: Marcos Eke   Orders Received/Actions taken: None at this time; pt is Jehovah's Witness

## 2019-08-27 NOTE — Progress Notes (Signed)
STROKE TEAM PROGRESS NOTE   INTERVAL HISTORY Sister at bedside. I also talked with her son over the phone. Pt is drowsy sleepy but arousable and fully orientated. Moderate dysarthria with mild SOB and wheezing. Still has left sided weakness and right gaze preference and left neglect. Hb this am 4.8->5.0. received EPO and iron infusion. Not able to eat today will put in cortrak for TF. Pt is full code.   Vitals:   08/27/19 0600 08/27/19 0615 08/27/19 0630 08/27/19 0800  BP: (!) 124/56     Pulse: (!) 115 (!) 112 92   Resp: (!) 23 (!) 27 (!) 21   Temp:    99.3 F (37.4 C)  TempSrc:    Axillary  SpO2: 97% 95% 92%   Weight:      Height:        CBC:  Recent Labs  Lab 08/26/19 0555 08/26/19 0555 08/26/19 0732 08/26/19 2244 08/27/19 0230 08/27/19 0529  WBC 11.4*   < > 11.8*  --  11.6*  --   NEUTROABS 8.9*  --  8.9*  --   --   --   HGB 6.9*   < > 7.0*   < > 4.8* 5.0*  HCT 20.4*   < > 20.4*   < > 15.1* 16.1*  MCV 91.1   < > 89.9  --  91.5  --   PLT 206   < > 223  --  182  --    < > = values in this interval not displayed.    Basic Metabolic Panel:  Recent Labs  Lab 08/26/19 0555 08/27/19 0230  NA 134* 141  K 4.5 3.5  CL 103 114*  CO2 17* 19*  GLUCOSE 289* 189*  BUN 31* 21  CREATININE 1.78* 1.12*  CALCIUM 8.0* 7.6*   Lipid Panel:     Component Value Date/Time   CHOL 217 (H) 05/22/2019 1312   TRIG 114 05/22/2019 1312   HDL 65 05/22/2019 1312   CHOLHDL 3.3 05/22/2019 1312   LDLCALC 132 (H) 05/22/2019 1312   HgbA1c:  Lab Results  Component Value Date   HGBA1C 11.2 (H) 05/22/2019   Urine Drug Screen: No results found for: LABOPIA, COCAINSCRNUR, LABBENZ, AMPHETMU, THCU, LABBARB  Alcohol Level No results found for: ETH  IMAGING past 24 hours CT ABDOMEN PELVIS WO CONTRAST  Result Date: 08/26/2019 CLINICAL DATA:  Abdominal distension. EXAM: CT ABDOMEN AND PELVIS WITHOUT CONTRAST TECHNIQUE: Multidetector CT imaging of the abdomen and pelvis was performed following  the standard protocol without IV contrast. COMPARISON:  None. FINDINGS: Lower chest: No acute abnormality. Hepatobiliary: No focal liver abnormality is seen. No gallstones, gallbladder wall thickening, or biliary dilatation. Pancreas: Unremarkable. No pancreatic ductal dilatation or surrounding inflammatory changes. Spleen: Normal in size without focal abnormality. Adrenals/Urinary Tract: Adrenal glands and kidneys are unremarkable. No hydronephrosis or renal obstruction is noted. Foley catheter is noted within urinary bladder. Stomach/Bowel: Stomach is within normal limits. Appendix appears normal. No evidence of bowel wall thickening, distention, or inflammatory changes. Vascular/Lymphatic: Atherosclerosis of abdominal aorta is noted without aneurysm formation. There does appear to be a mild to moderate amount of retroperitoneal hematoma in the right iliac and inguinal regions. Moderate size hematoma is noted in the soft tissues anterior to the musculature of the right hip. No definite intraperitoneal hemorrhage is noted. Reproductive: Status post hysterectomy. No adnexal masses. Other: Moderate size fat containing periumbilical hernia is noted. No ascites is noted. Musculoskeletal: No acute or significant osseous findings. IMPRESSION:  1. Mild to moderate amount of retroperitoneal hematoma is noted in the right iliac and inguinal regions. Moderate size hematoma is noted in the soft tissues anterior to the musculature of the right hip. No definite intraperitoneal hemorrhage is noted. These results will be called to the ordering clinician or representative by the Radiologist Assistant, and communication documented in the PACS or zVision Dashboard. 2. Moderate size fat containing periumbilical hernia. Aortic Atherosclerosis (ICD10-I70.0). Electronically Signed   By: Lupita RaiderJames  Green Jr M.D.   On: 08/26/2019 16:31   CT HEAD WO CONTRAST  Result Date: 08/27/2019 CLINICAL DATA:  Evaluate for source of hemorrhage EXAM: CT  HEAD WITHOUT CONTRAST TECHNIQUE: Contiguous axial images were obtained from the base of the skull through the vertex without intravenous contrast. COMPARISON:  Brain MRI from yesterday FINDINGS: Brain: Small infarcts by brain MRI are largely occult on this study. Remote perforator infarct at the right basal ganglia and lateral left cerebellum. Mild cerebral volume loss. No hemorrhage, hydrocephalus, or masslike finding. Vascular: No hyperdense vessel or unexpected calcification. Skull: Normal. Negative for fracture or focal lesion. Sinuses/Orbits: Chronic left maxillary sinusitis with complete opacification. Patchy mucosal thickening elsewhere. IMPRESSION: 1. Known acute infarcts that are underestimated by CT. No detected progression or acute hemorrhage. 2. 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 Electronically Signed   By: Marnee SpringJonathon  Watts M.D.   On: 08/27/2019 06:40   MR ANGIO HEAD WO CONTRAST  Result Date: 08/26/2019 CLINICAL DATA:  Right ICA angioplasty with left-sided weakness EXAM: MRI HEAD WITHOUT CONTRAST MRA HEAD WITHOUT CONTRAST TECHNIQUE: Multiplanar, multiecho pulse sequences of the brain and surrounding structures were obtained without intravenous contrast. Angiographic images of the head were obtained using MRA technique without contrast. COMPARISON:  10/09/2018 FINDINGS: MRI HEAD FINDINGS Brain: Small (subcentimeter) scattered cortical and white matter infarcts along the right cerebral convexity and in the high left parietal cortex. On the right these are in a roughly watershed distribution. Small acute infarct in the upper right cerebellum. Small remote left cerebellar infarct Remote perforator infarct at the right basal ganglia. Mild chronic small vessel ischemic change in the cerebral white matter. No hydrocephalus or collection. Vascular: See below Skull and upper cervical spine: Normal marrow signal Sinuses/Orbits: Chronic left maxillary sinusitis with complete opacification  also seen in 2020. MRA HEAD FINDINGS The right ICA is smaller than the left. Only mild narrowing at the right cervical petrous segment, an improvement. Severe right MCA stenosis with patent downstream vasculature. No left-sided or anterior cerebral stenosis or branch occlusion. High-grade narrowing at the non dominant right V4 segment. Moderate mid basilar stenosis. Patent bilateral PCA. IMPRESSION: 1. Scattered small acute infarcts along the right MCA/watershed territory. Solitary small acute infarct in the left parietal cortex and right cerebellum. 2. Improved right ICA patency at the petrous segment. 3. Known severe right M1 segment stenosis. 4. 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 Electronically Signed   By: Marnee SpringJonathon  Watts M.D.   On: 08/26/2019 11:05   MR BRAIN WO CONTRAST  Result Date: 08/26/2019 CLINICAL DATA:  Right ICA angioplasty with left-sided weakness EXAM: MRI HEAD WITHOUT CONTRAST MRA HEAD WITHOUT CONTRAST TECHNIQUE: Multiplanar, multiecho pulse sequences of the brain and surrounding structures were obtained without intravenous contrast. Angiographic images of the head were obtained using MRA technique without contrast. COMPARISON:  10/09/2018 FINDINGS: MRI HEAD FINDINGS Brain: Small (subcentimeter) scattered cortical and white matter infarcts along the right cerebral convexity and in the high left parietal cortex. On the right these are in  a roughly watershed distribution. Small acute infarct in the upper right cerebellum. Small remote left cerebellar infarct Remote perforator infarct at the right basal ganglia. Mild chronic small vessel ischemic change in the cerebral white matter. No hydrocephalus or collection. Vascular: See below Skull and upper cervical spine: Normal marrow signal Sinuses/Orbits: Chronic left maxillary sinusitis with complete opacification also seen in 2020. MRA HEAD FINDINGS The right ICA is smaller than the left. Only mild narrowing at the right cervical  petrous segment, an improvement. Severe right MCA stenosis with patent downstream vasculature. No left-sided or anterior cerebral stenosis or branch occlusion. High-grade narrowing at the non dominant right V4 segment. Moderate mid basilar stenosis. Patent bilateral PCA. IMPRESSION: 1. Scattered small acute infarcts along the right MCA/watershed territory. Solitary small acute infarct in the left parietal cortex and right cerebellum. 2. Improved right ICA patency at the petrous segment. 3. Known severe right M1 segment stenosis. 4. 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 Electronically Signed   By: Marnee Spring M.D.   On: 08/26/2019 11:05   [place] EKG SITE RITE  Result Date: 08/27/2019 If Site Rite image not attached, placement could not be confirmed due to current cardiac rhythm.  CT Angio Abd/Pel w/ and/or w/o  Result Date: 08/27/2019 CLINICAL DATA:  Acute blood loss anemia.  Recent arteriogram. EXAM: CTA ABDOMEN AND PELVIS WITHOUT AND WITH CONTRAST TECHNIQUE: Multidetector CT imaging of the abdomen and pelvis was performed using the standard protocol during bolus administration of intravenous contrast. Multiplanar reconstructed images and MIPs were obtained and reviewed to evaluate the vascular anatomy. CONTRAST:  OMNIPAQUE IOHEXOL 350 MG/ML SOLN COMPARISON:  Yesterday FINDINGS: VASCULAR Aorta: Diffuse atheromatous plaque.  Negative for aneurysm Celiac: Plaque at the origin moderate narrowing. No branch occlusion. SMA: Diffusely patent without stenosis or dissection Renals: Accessory left upper pole renal artery. There is atherosclerotic plaque without proximal high-grade stenosis. IMA: Patent Inflow: Atheromatous plaque. Proximal Outflow: No visible irregularity of common femoral or visible superficial femoral arteries. Veins: Hemorrhage tracks along the external iliac vein extends into the pelvis, overall small volume for the procedure. Review of the MIP images confirms the above  findings. NON-VASCULAR Lower chest: Partially covered opacity at the right lower lobe with trace left pleural effusion. The airways appear cuffed and there are a few thickened interlobular septae a. Hepatobiliary: No focal liver abnormality.Contrast excretion into the gallbladder. Pancreas: Unremarkable. Spleen: Unremarkable. Adrenals/Urinary Tract: Negative adrenals. No hydronephrosis or stone. Unremarkable bladder. Stomach/Bowel:  No obstruction. No appendicitis. Lymphatic: No mass or adenopathy.  Fatty umbilical hernia. Reproductive:No pathologic findings. Other: No ascites or pneumoperitoneum. Musculoskeletal: No acute abnormalities. Lower lumbar disc bulging and facet spurring. Case discussed with Dr. Edilia Bo, fortunately the thigh remain soft. IMPRESSION: 1. No visualized active bleeding. There is unchanged and relatively mild blood products in the right groin and retroperitoneum. There was right rectus femoris expansion and hemorrhage on the preceding abdominal CT, less well covered today. 2. Mild pulmonary edema. Electronically Signed   By: Marnee Spring M.D.   On: 08/27/2019 07:02    PHYSICAL EXAM  Temp:  [98.3 F (36.8 C)-99.3 F (37.4 C)] 98.7 F (37.1 C) (06/16 1200) Pulse Rate:  [81-115] 106 (06/16 1100) Resp:  [11-43] 26 (06/16 1100) BP: (93-143)/(47-104) 117/53 (06/16 1100) SpO2:  [92 %-100 %] 100 % (06/16 1100) Arterial Line BP: (110-163)/(43-137) 163/59 (06/16 1100)  General - Well nourished, well developed, drowsy and mild SOB with wheezing.  Ophthalmologic - fundi not visualized due to noncooperation.  Cardiovascular - Regular rhythm and rate.  Neuro - drowsy sleepy but easily arousable, fully orientated to time, place, age and people. Paucity of speech but following all commands, no aphasia but moderate dysarthria with wheezing. Right gaze preference, not cross midline. Left neglect, not blinking to visual threat on the left but blinking to the right. PERRL. Left facial  droop. Left UE 2-/5 and LLE 0/5 with pain stimulation. RUE 4/5 at least and RLE 3/5 at least. Sensation subjectively symmetrical, right FTN intact. Gait not tested.    ASSESSMENT/PLAN [name] is a 69 y.o. 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 MRI with scattered R MCA watershed infarcts, L frontal cortex and R cerebellar infarcts.   Intracranial Stenoses s/p angioplasty History of stroke  06/2018 R lentiform nucleus, external and extreme capsule infarct. mild periventricular small vessel disease. multiple uncontrolled risk factors. LDL 131 and A1C 11.4. OP w/u. Put on plavix but non-compliant. Intolerant to statins.   Found to have severe intracranial stenoses on MRA - referred to Dr. Corliss Skains.    10/2018 cerebral angio - 85% stenosis of the mid M1 segment of the right MCA. Severe high-grade approximately 90% stenosis of left VBJ, and of 60% just distal to the origin of the AICA. Approximately 50% stenosis at the origin of the right ICA at the bulb, and also about 60% of the right ICA at the cervical petrous junction.   Plan for 11/2018 procedure but P2Y12 was high, plavix changed to brilinta and procedure postponed.   08/25/2019 (Dr. Corliss Skains) S/p R cervical ICA/Petrous jxn angioplasty 08/25/19 ->25% residual stenosis. Severe stenosis of RT MCA M 1 seg with improved flow post angioplasty.    post-procedure worsening L sided weakness and neglect  MRI with scattered R MCA watershed infarcts, L frontal cortex and R cerebellar infarcts.   Hematoma R Thigh Severe Acute Blood Loss Anemia  CT abd/pelvis 6/15 large R groin retroperitoneal hematoma iliac & inguinal mostly in muscle and soft  tissue  Hgb 13.3->6.9->7->5.1->4.8->5.0  P2Y12 164  VVS consult Edilia Bo) - no procedure needed  Pt is a Jehovah's Witness - refuses all blood products including albumin, son confirmed  Aspirin and brilinta now on hold  Not safe for wound exploration d/t risk of additional blood loss  hgb drop with dilutional component d/t increased fluids to maintain stent patency  Femstop considered, not felt to be actively bleeding  Repeat CT abd/pelvis 6/16 no active bleeding  Stroke:   R MCA scattered watershed, L frontal and R cerebellar infarcts following R ICA/P2 jxn angioplasty   MRI  Scattered R MCA/watershed infarcts. L parietal cortex infarct. R cerebellar infarct.  MRA   Improved R ICA patency. Severe R M1 stenosis. High-grade L V4 and moderate mid BA stenosis.   CT head No progression of infarcts or hemorrhage  2D echo EF 60-65%. No source of embolus  aspirin 81 mg daily and Brilinta (ticagrelor) 90 mg bid prior to admission, now on hold given hmg   Therapy recommendations:  pending   Disposition:  pending   Hypotension  BP goal 120-160  Max neo  PICC line placed  CCM onboard   . Long-term BP goal 130-150 given intracranial stenoses  Hyperlipidemia  Home meds:  Repatha & fish oil  Intolerant to  statins  LDL 132 in March, goal < 70  Continue Repatha at discharge  Diabetes type II Uncontrolled  HgbA1c 11.2 in March, goal < 7.0  CBGs  SSI  Other Stroke Risk Factors  Advanced age  Overweight, Body mass index is 28.53 kg/m., recommend weight loss, diet and exercise as appropriate   Hx stroke/TIA (see above)  Family hx stroke (mother)  Snoring. OP sleep eval showed mild Obstructive sleep apnea overall but severe apnea during REM w/ CPAP recommended   Other Active Problems  Hx noncompliance  AKI pre-renal in setting of anemia 0.93->1.78->1.12  Leukocytosis 11.8->11.4->11.6  Hypokalemia 4.5->3.5  Hx palpitations on coreg, event monitor  ordered 09/2018 but never completed   Hospital day # 2  This patient is critically ill due to stroke post procedure, severe anemia, hematoma on the thigh, hypotension and at significant risk of neurological worsening, death form severe anemia, bleeding, recurrent stroke, intracranial stenosis. This patient's care requires constant monitoring of vital signs, hemodynamics, respiratory and cardiac monitoring, review of multiple databases, neurological assessment, discussion with family, other specialists and medical decision making of high complexity. I spent 40 minutes of neurocritical care time in the care of this patient. I had long discussion with son at bedside, updated pt current condition, treatment plan and potential prognosis, and answered all the questions. He expressed understanding and appreciation. I also discussed with Dr. Estanislado Pandy.   Rosalin Hawking, MD PhD Stroke Neurology 08/27/2019 8:47 PM    To contact Stroke Continuity provider, please refer to [email or web address] After hours, contact General Neurology

## 2019-08-27 NOTE — Progress Notes (Signed)
Patient now with increased lethargy and not oriented to any questions. Last Hgb at 0230 was decreased to 4.8 from 5.1. Most recent SBP was 130 and CBG was 180.   Exam:  BP 121/75   Pulse 97   Temp 98.3 F (36.8 C) (Oral)   Resp (!) 21   Ht 5\' 1"  (1.549 m)   Wt 68.5 kg   SpO2 99%   BMI 28.53 kg/m   Gen: Ill-appearing, somnolent, NAD Lungs: Grossly audible wheezing Ext: No edema  Neuro: Ment: Somnolent. Speech sparse and dysarthric, but fluent. Appears confused. Oriented to year and month, but not day, city or state. Decreased attention to her left  CN: PERRL. Left visual field cut. Has hesitancy when gazing to the left. Face grossly symmetric. Motor: Moves all 4 extremities with 4/5 strength, slightly weaker on the left.  Sensory: Reacts to tactile stimuli.   A/R -- Obtaining STAT Hgb level with ABG -- STAT CT head -- STAT CTA of abdomen and pelvis with delayed phase to determine if there is a focus of contrast extravasation c/w active bleeding -- Together with CCM, we will contact family again to update them regarding the patient's condition. There is a high risk of imminent decompensation and death given dropping Hgb and refusal to undergo blood transfusion.  -- Discussed with Dr. of VIR. If a bleeding source is identified that is amenable to endovascular interventional, abdominal IR will need to be called.   35 minutes spent in the emergent neurological evaluation and management of this critically ill patient. Time spent included coordination of care.   Electronically signed: Dr. Quay Burow

## 2019-08-27 NOTE — Progress Notes (Signed)
SLP Cancellation Note  Patient Details Name: Donna [name] MRN: [id number] DOB: 1950-08-27   Cancelled treatment:       Reason Eval/Treat Not Completed: Medical issues which prohibited therapy. Will f/u for cognitive evaluation as appropriate.    Mahala Menghini., M.A. CCC-SLP Acute Rehabilitation Services Pager [phone] Office [phone]  08/27/2019, 11:14 AM

## 2019-08-27 NOTE — Progress Notes (Signed)
  PCCM Interval Progress Note  Blood pressure 121/75, pulse 97, temperature 98.3 F (36.8 C), temperature source Oral, resp. rate (!) 21, height 5\' 1"  (1.549 m), weight 68.5 kg, SpO2 99 %.  Down to 30 mcg of neosynephrine.  Called to reassess patient as RN reported less responsive.   On exam, she remains lethargic, follows simple commands, unchanged from prior exam. Somewhat restless. Continues to deny any pain or SOB.   ABG obtained, reassuring, compensated metabolic acidosis 7.442/ 25/ 64  Will place on supplemental O2 given anemia  Noted continued decline in Hgb.  Hgb 5.1 -> 4.8 (0230).  Repeat Hgb pending now.  sCr improved 1.78->1.12  Neurology at bedside as well.  Plans for repeat CTH.    Spoke with Dr. with Neuro IR, aware/ agreed to hold ASA/ plavix, and to send for CTA a/p to look for possible source of bleeding, for ?possible IR intervention.    Again, called and spoke with patient's son, Quay Burow who is aware of her critical status and that this could be life threatening for patient and encouraging him to visit now.  He again confirms his mother's religious beliefs being a Jehovah Witness.  He is at work but will be here hopefully around noon.  He is calling his aunt to update rest of family.     Evaristo Bury, MSN, AGACNP-BC [facility] 08/27/2019, 6:37 AM  See 08/29/2019 for personal pager PCCM on call pager [phone]

## 2019-08-27 NOTE — Progress Notes (Signed)
OT Cancellation Note  Patient Details Name: Donna [name] MRN: [id number] DOB: 04/17/1950   Cancelled Treatment:    Reason Eval/Treat Not Completed: Patient not medically ready Per RN, pt is currently not medically stable enough to engage in OT POC. RN reports decline in pt status since previous date. Will hold at this time and continue to follow as available and appropriate.  Dalphine Handing, MSOT, OTR/L Acute Rehabilitation Services [facility] Office Number: [phone] Pager: [phone]  Dalphine Handing 08/27/2019, 12:19 PM

## 2019-08-27 NOTE — Progress Notes (Signed)
NIR Brief Note:  Right ICA cervical/petrous junction stenosis s/p revascularization using balloon angioplasty via right radial and right femoral approach6/14/2021by Dr. Corliss Skains.  Went to evaluate patient bedside alongside Dr. Corliss Skains. Patient laying in bed, lethargic. She responds to name but does not follow simple commands. Restless, easily fatigued- quickly falls back asleep once awoken. Still with no movements of left side/left side neglect, and otherwise no change to neurologic exam. Right groin puncture site soft, no increase in girth of right thigh.  Accompanied by two sons, Casimiro Needle and Evaristo Bury, along with patient's grandson ([name]'s son). Dr. Corliss Skains discussed patient's current condition. Again, at this time, patient's sons wish to respect patient's wishes of no blood products and request use of IV iron/Erythropoietin at this time. Patient is currently a full code. Continue to hold Brilinta/Aspirin. Do not recommend trending H/H at this time. Further plans per CCM/neurology/TRH/vascular surgery- appreciate and agree with management. NIR to follow.   Waylan Boga Chandel Zaun, PA-C 08/27/2019, 4:45 PM

## 2019-08-27 NOTE — Progress Notes (Addendum)
VASCULAR SURGERY:  Her blood pressure has remained stable.  Her phenylephrine is being weaned down as IR is excepting a lower systolic blood pressure (they were trying to maintain a higher blood pressure given her intracranial angioplasty).  Her abdomen is soft and nontender which would suggest no progression of her small retroperitoneal hematoma.  The swelling in her left thigh in her right thigh is unchanged and there is no significant hematoma at the cannulation site.  I have again tried to encourage her to consider receiving a transfusion.  She feels strongly against this and her son has also concurred.  She is moving around quite a bit in bed so a FemoStop will likely not be useful.  I do not think she is actively bleeding but felt this might be one thing to consider given her overall situation.  Waverly Ferrari, MD Office: [phone]

## 2019-08-27 NOTE — Progress Notes (Signed)
Peripherally Inserted Central Catheter Placement  The IV Nurse has discussed with the patient and/or persons authorized to consent for the patient, the purpose of this procedure and the potential benefits and risks involved with this procedure.  The benefits include less needle sticks, lab draws from the catheter, and the patient may be discharged home with the catheter. Risks include, but not limited to, infection, bleeding, blood clot (thrombus formation), and puncture of an artery; nerve damage and irregular heartbeat and possibility to perform a PICC exchange if needed/ordered by physician.  Alternatives to this procedure were also discussed.  Bard Power PICC patient education guide, fact sheet on infection prevention and patient information card has been provided to patient /or left at bedside.    PICC Placement Documentation  PICC Triple Lumen 08/27/19 PICC Right Brachial 38 cm 0 cm (Active)  Indication for Insertion or Continuance of Line Vasoactive infusions 08/27/19 0227  Exposed Catheter (cm) 0 cm 08/27/19 0227  Site Assessment Clean;Dry;Intact 08/27/19 0227  Lumen #1 Status Blood return noted;Flushed;Saline locked 08/27/19 0227  Lumen #2 Status Blood return noted;Flushed;Saline locked 08/27/19 0227  Lumen #3 Status Blood return noted;Flushed;Saline locked 08/27/19 0227  Dressing Type Transparent;Occlusive;Securing device 08/27/19 0227  Dressing Status Clean;Dry;Intact;Antimicrobial disc in place 08/27/19 0227  Line Adjustment (NICU/IV Team Only) No 08/27/19 0227  Dressing Intervention New dressing 08/27/19 0227  Dressing Change Due 09/03/19 08/27/19 0227       Christeen Douglas 08/27/2019, 2:41 AM

## 2019-08-27 NOTE — Progress Notes (Signed)
PT Cancellation Note  Patient Details Name: Donna [name] MRN: [id number] DOB: 28-Nov-1950   Cancelled Treatment:    Reason Eval/Treat Not Completed: Medical issues which prohibited therapy. Per RN, pt with declining medical status and not appropriate for therapy session today. PT will continue to follow up with pt acutely as available and appropriate.    Alessandra Bevels Hoke Baer 08/27/2019, 12:34 PM

## 2019-08-28 ENCOUNTER — Inpatient Hospital Stay (HOSPITAL_COMMUNITY): Payer: Medicare HMO

## 2019-08-28 DIAGNOSIS — I6340 Cerebral infarction due to embolism of unspecified cerebral artery: Secondary | ICD-10-CM

## 2019-08-28 DIAGNOSIS — J9601 Acute respiratory failure with hypoxia: Secondary | ICD-10-CM

## 2019-08-28 DIAGNOSIS — J9600 Acute respiratory failure, unspecified whether with hypoxia or hypercapnia: Secondary | ICD-10-CM

## 2019-08-28 DIAGNOSIS — J810 Acute pulmonary edema: Secondary | ICD-10-CM

## 2019-08-28 DIAGNOSIS — R0902 Hypoxemia: Secondary | ICD-10-CM

## 2019-08-28 DIAGNOSIS — R062 Wheezing: Secondary | ICD-10-CM

## 2019-08-28 LAB — COMPREHENSIVE METABOLIC PANEL
ALT: 24 U/L (ref 0–44)
AST: 29 U/L (ref 15–41)
Albumin: 2.7 g/dL — ABNORMAL LOW (ref 3.5–5.0)
Alkaline Phosphatase: 46 U/L (ref 38–126)
Anion gap: 10 (ref 5–15)
BUN: 13 mg/dL (ref 8–23)
CO2: 22 mmol/L (ref 22–32)
Calcium: 8.3 mg/dL — ABNORMAL LOW (ref 8.9–10.3)
Chloride: 111 mmol/L (ref 98–111)
Creatinine, Ser: 0.96 mg/dL (ref 0.44–1.00)
GFR calc Af Amer: >60 mL/min (ref >60)
GFR calc non Af Amer: >60 mL/min (ref >60)
Glucose, Bld: 316 mg/dL — ABNORMAL HIGH (ref 70–99)
Potassium: 3.7 mmol/L (ref 3.5–5.1)
Sodium: 143 mmol/L (ref 135–145)
Total Bilirubin: 0.9 mg/dL (ref 0.3–1.2)
Total Protein: 5.7 g/dL — ABNORMAL LOW (ref 6.5–8.1)

## 2019-08-28 LAB — POCT I-STAT 7, (LYTES, BLD GAS, ICA,H+H)
Acid-Base Excess: 3 mmol/L — ABNORMAL HIGH (ref 0.0–2.0)
Acid-base deficit: 1 mmol/L (ref 0.0–2.0)
Bicarbonate: 22.4 mmol/L (ref 20.0–28.0)
Bicarbonate: 26.5 mmol/L (ref 20.0–28.0)
Calcium, Ion: 1.2 mmol/L (ref 1.15–1.40)
Calcium, Ion: 1.21 mmol/L (ref 1.15–1.40)
HCT: 35 % — ABNORMAL LOW (ref 36.0–46.0)
HCT: 20 % — ABNORMAL LOW (ref 36.0–46.0)
Hemoglobin: 11.9 g/dL — ABNORMAL LOW (ref 12.0–15.0)
Hemoglobin: 6.8 g/dL — CL (ref 12.0–15.0)
O2 Saturation: 94 %
O2 Saturation: 98 %
Patient temperature: 99.2
Patient temperature: 98.9
Potassium: 3.5 mmol/L (ref 3.5–5.1)
Potassium: 3.3 mmol/L — ABNORMAL LOW (ref 3.5–5.1)
Sodium: 144 mmol/L (ref 135–145)
Sodium: 146 mmol/L — ABNORMAL HIGH (ref 135–145)
TCO2: 23 mmol/L (ref 22–32)
TCO2: 28 mmol/L (ref 22–32)
pCO2 arterial: 34.4 mmHg (ref 32.0–48.0)
pCO2 arterial: 34.1 mmHg (ref 32.0–48.0)
pH, Arterial: 7.423 (ref 7.350–7.450)
pH, Arterial: 7.499 — ABNORMAL HIGH (ref 7.350–7.450)
pO2, Arterial: 69 mmHg — ABNORMAL LOW (ref 83.0–108.0)
pO2, Arterial: 97 mmHg (ref 83.0–108.0)

## 2019-08-28 LAB — CBC
HCT: 16.5 % — ABNORMAL LOW (ref 36.0–46.0)
Hemoglobin: 5.2 g/dL — CL (ref 12.0–15.0)
MCH: 29.4 pg (ref 26.0–34.0)
MCHC: 31.5 g/dL (ref 30.0–36.0)
MCV: 93.2 fL (ref 80.0–100.0)
Platelets: 177 10*3/uL (ref 150–400)
RBC: 1.77 MIL/uL — ABNORMAL LOW (ref 3.87–5.11)
RDW: 13.9 % (ref 11.5–15.5)
WBC: 13.1 10*3/uL — ABNORMAL HIGH (ref 4.0–10.5)
nRBC: 1 % — ABNORMAL HIGH (ref 0.0–0.2)

## 2019-08-28 LAB — GLUCOSE, CAPILLARY
Glucose-Capillary: 224 mg/dL — ABNORMAL HIGH (ref 70–99)
Glucose-Capillary: 256 mg/dL — ABNORMAL HIGH (ref 70–99)
Glucose-Capillary: 236 mg/dL — ABNORMAL HIGH (ref 70–99)
Glucose-Capillary: 237 mg/dL — ABNORMAL HIGH (ref 70–99)
Glucose-Capillary: 162 mg/dL — ABNORMAL HIGH (ref 70–99)
Glucose-Capillary: 155 mg/dL — ABNORMAL HIGH (ref 70–99)

## 2019-08-28 LAB — HAPTOGLOBIN: Haptoglobin: 137 mg/dL (ref 37–355)

## 2019-08-28 LAB — MAGNESIUM: Magnesium: 1.9 mg/dL (ref 1.7–2.4)

## 2019-08-28 MED ORDER — ALBUTEROL SULFATE (2.5 MG/3ML) 0.083% IN NEBU
2.5000 mg | INHALATION_SOLUTION | RESPIRATORY_TRACT | Status: DC | PRN
  Administered 2019-09-16 – 2019-10-28 (×4): 2.5 mg via RESPIRATORY_TRACT
  Filled 2019-08-28 (×4): qty 3

## 2019-08-28 MED ORDER — HYDRALAZINE HCL 20 MG/ML IJ SOLN
5.0000 mg | Freq: Once | INTRAMUSCULAR | Status: AC
  Administered 2019-08-28: 5 mg via INTRAVENOUS
  Filled 2019-08-28: qty 1

## 2019-08-28 MED ORDER — MAGNESIUM SULFATE 2 GM/50ML IV SOLN
2.0000 g | Freq: Once | INTRAVENOUS | Status: AC
  Administered 2019-08-28: 2 g via INTRAVENOUS
  Filled 2019-08-28: qty 50

## 2019-08-28 MED ORDER — DEXMEDETOMIDINE HCL IN NACL 400 MCG/100ML IV SOLN
0.3000 ug/kg/h | INTRAVENOUS | Status: DC
  Administered 2019-08-28: 0.3 ug/kg/h via INTRAVENOUS
  Filled 2019-08-28: qty 100

## 2019-08-28 MED ORDER — INSULIN ASPART 100 UNIT/ML ~~LOC~~ SOLN
3.0000 [IU] | SUBCUTANEOUS | Status: DC
  Administered 2019-08-28 – 2019-08-31 (×18): 3 [IU] via SUBCUTANEOUS

## 2019-08-28 MED ORDER — IPRATROPIUM-ALBUTEROL 0.5-2.5 (3) MG/3ML IN SOLN
3.0000 mL | RESPIRATORY_TRACT | Status: DC | PRN
  Administered 2019-08-28: 3 mL via RESPIRATORY_TRACT
  Filled 2019-08-28 (×2): qty 3

## 2019-08-28 MED ORDER — FUROSEMIDE 10 MG/ML IJ SOLN
40.0000 mg | Freq: Once | INTRAMUSCULAR | Status: AC
  Administered 2019-08-28: 40 mg via INTRAVENOUS
  Filled 2019-08-28: qty 4

## 2019-08-28 MED ORDER — JEVITY 1.2 CAL PO LIQD
1000.0000 mL | ORAL | Status: DC
  Administered 2019-08-28 – 2019-09-03 (×7): 1000 mL
  Filled 2019-08-28 (×10): qty 1000

## 2019-08-28 MED ORDER — FUROSEMIDE 10 MG/ML IJ SOLN
20.0000 mg | Freq: Once | INTRAMUSCULAR | Status: AC
  Administered 2019-08-28: 20 mg via INTRAVENOUS
  Filled 2019-08-28: qty 2

## 2019-08-28 NOTE — Progress Notes (Signed)
STROKE TEAM PROGRESS NOTE   INTERVAL HISTORY RN at bedside. Pt still very drowsy and sleepy and lethargic, no significant neuro changes. She was on precedex but now off. She received iron infusion and EPO yesterday. Today the Hb is 5.2, stable from yesterday. Had cortrak and now on tube feeding. BP stable.   Vitals:   08/28/19 0500 08/28/19 0600 08/28/19 0700 08/28/19 0800  BP: (!) 141/66 (!) 127/52 (!) 113/52 (!) 131/55  Pulse: 100 80 78 99  Resp: (!) 28 (!) 22 (!) 21 (!) 21  Temp:    99.7 F (37.6 C)  TempSrc:    Axillary  SpO2: 93% 99% 100% 100%  Weight:      Height:       CBC:  Recent Labs  Lab 08/26/19 0555 08/26/19 0555 08/26/19 0732 08/26/19 2244 08/27/19 0230 08/27/19 0529 08/28/19 0500 08/28/19 0505  WBC 11.4*   < > 11.8*  --  11.6*  --  13.1*  --   NEUTROABS 8.9*  --  8.9*  --   --   --   --   --   HGB 6.9*   < > 7.0*   < > 4.8*   < > 5.2* 11.9*  HCT 20.4*   < > 20.4*   < > 15.1*   < > 16.5* 35.0*  MCV 91.1   < > 89.9  --  91.5  --  93.2  --   PLT 206   < > 223  --  182  --  177  --    < > = values in this interval not displayed.   Basic Metabolic Panel:  Recent Labs  Lab 08/27/19 0230 08/27/19 0546 08/28/19 0500 08/28/19 0505  NA 141   < > 143 144  K 3.5   < > 3.7 3.5  CL 114*  --  111  --   CO2 19*  --  22  --   GLUCOSE 189*  --  316*  --   BUN 21  --  13  --   CREATININE 1.12*  --  0.96  --   CALCIUM 7.6*  --  8.3*  --   MG  --   --  1.9  --    < > = values in this interval not displayed.   Lipid Panel:     Component Value Date/Time   CHOL 217 (H) 05/22/2019 1312   TRIG 114 05/22/2019 1312   HDL 65 05/22/2019 1312   CHOLHDL 3.3 05/22/2019 1312   LDLCALC 132 (H) 05/22/2019 1312   HgbA1c:  Lab Results  Component Value Date   HGBA1C 11.2 (H) 05/22/2019   Urine Drug Screen: No results found for: LABOPIA, COCAINSCRNUR, LABBENZ, AMPHETMU, THCU, LABBARB  Alcohol Level No results found for: ETH  IMAGING past 24 hours DG CHEST PORT 1  VIEW  Result Date: 08/28/2019 CLINICAL DATA:  Hypoxia EXAM: PORTABLE CHEST 1 VIEW COMPARISON:  Yesterday FINDINGS: Increased generalized interstitial opacity. There is also more dense hazy opacity over the upper lungs, usually scarring in this location but not seen in 2020. Right PICC with tip at the SVC. Feeding tube which reaches the stomach IMPRESSION: Increased interstitial opacity, question edema. There also appears to be airspace disease in the upper lobes, infection not excluded. Electronically Signed   By: Monte Fantasia M.D.   On: 08/28/2019 04:13   DG CHEST PORT 1 VIEW  Result Date: 08/27/2019 CLINICAL DATA:  Wheezing EXAM: PORTABLE CHEST 1 VIEW COMPARISON:  11/29/2018 FINDINGS: Esophageal tube tip is below the diaphragm but incompletely visualized. Right upper extremity central venous catheter tip over the SVC. Mild diffuse interstitial opacity with subtle nodularity. Stable cardiomediastinal silhouette with aortic atherosclerosis. No pneumothorax. IMPRESSION: Mild diffuse increased interstitial opacity with subtle nodularity, potentially due to atypical infection. Electronically Signed   By: Jasmine PangKim  Fujinaga M.D.   On: 08/27/2019 19:20   ECHOCARDIOGRAM COMPLETE  Result Date: 08/27/2019    ECHOCARDIOGRAM REPORT   Patient Name:   Donna [name] Date of Exam: 08/27/2019 Medical Rec #:  [id number]       Height:       61.0 in Accession #:    [id number]      Weight:       151.0 lb Date of Birth:  February 12, 1951       BSA:          1.676 m Patient Age:    69 years        BP:           117/53 mmHg Patient Gender: F               HR:           106 bpm. Exam Location:  Inpatient Procedure: 2D Echo Indications:    Stroke  History:        Patient has prior history of Echocardiogram examinations, most                 recent 10/08/2018. Risk Factors:Hypertension, Dyslipidemia and                 Diabetes.  Sonographer:    Thurman Coyerasey Kirkpatrick RDCS (AE) Referring Phys: [id number] Marvel PlanJINDONG Sharmaine Bain IMPRESSIONS  1. Left ventricular  ejection fraction, by estimation, is 60 to 65%. The left ventricle has normal function. The left ventricle has no regional wall motion abnormalities. There is moderate left ventricular hypertrophy of the basal-septal segment. Left ventricular diastolic function could not be evaluated.  2. Right ventricular systolic function is normal. The right ventricular size is normal. Tricuspid regurgitation signal is inadequate for assessing PA pressure.  3. The mitral valve is normal in structure. Trivial mitral valve regurgitation. No evidence of mitral stenosis.  4. The aortic valve was not well visualized. Aortic valve regurgitation is not visualized. No aortic stenosis is present.  5. The inferior vena cava is normal in size with greater than 50% respiratory variability, suggesting right atrial pressure of 3 mmHg. FINDINGS  Left Ventricle: Left ventricular ejection fraction, by estimation, is 60 to 65%. The left ventricle has normal function. The left ventricle has no regional wall motion abnormalities. The left ventricular internal cavity size was normal in size. There is  moderate left ventricular hypertrophy of the basal-septal segment. Left ventricular diastolic function could not be evaluated. Right Ventricle: The right ventricular size is normal. No increase in right ventricular wall thickness. Right ventricular systolic function is normal. Tricuspid regurgitation signal is inadequate for assessing PA pressure. Left Atrium: Left atrial size was normal in size. Right Atrium: Right atrial size was normal in size. Pericardium: There is no evidence of pericardial effusion. Mitral Valve: The mitral valve is normal in structure. Normal mobility of the mitral valve leaflets. Severe mitral annular calcification. Trivial mitral valve regurgitation. No evidence of mitral valve stenosis. MV peak gradient, 18.5 mmHg. The mean mitral valve gradient is 9.0 mmHg. Tricuspid Valve: The tricuspid valve is normal in structure.  Tricuspid valve regurgitation is not demonstrated. No evidence of  tricuspid stenosis. Aortic Valve: The aortic valve was not well visualized. . There is moderate thickening and moderate calcification of the aortic valve. Aortic valve regurgitation is not visualized. No aortic stenosis is present. There is moderate thickening of the aortic  valve. There is moderate calcification of the aortic valve. Pulmonic Valve: The pulmonic valve was normal in structure. Pulmonic valve regurgitation is not visualized. No evidence of pulmonic stenosis. Aorta: The aortic root is normal in size and structure. Venous: The inferior vena cava is normal in size with greater than 50% respiratory variability, suggesting right atrial pressure of 3 mmHg. IAS/Shunts: No atrial level shunt detected by color flow Doppler.  LEFT VENTRICLE PLAX 2D LVIDd:         4.20 cm LVIDs:         2.90 cm LV PW:         1.10 cm LV IVS:        1.30 cm LVOT diam:     2.00 cm LV SV:         45 LV SV Index:   27 LVOT Area:     3.14 cm  RIGHT VENTRICLE RV S prime:     10.90 cm/s LEFT ATRIUM             Index       RIGHT ATRIUM          Index LA diam:        3.00 cm 1.79 cm/m  RA Area:     5.38 cm LA Vol (A2C):   37.0 ml 22.07 ml/m RA Volume:   7.65 ml  4.56 ml/m LA Vol (A4C):   23.0 ml 13.72 ml/m LA Biplane Vol: 31.4 ml 18.73 ml/m  AORTIC VALVE LVOT Vmax:   90.60 cm/s LVOT Vmean:  63.800 cm/s LVOT VTI:    0.142 m  AORTA Ao Root diam: 2.80 cm MITRAL VALVE MV Area (PHT): 4.49 cm     SHUNTS MV Peak grad:  18.5 mmHg    Systemic VTI:  0.14 m MV Mean grad:  9.0 mmHg     Systemic Diam: 2.00 cm MV Vmax:       2.15 m/s MV Vmean:      142.0 cm/s MV Decel Time: 169 msec MV E velocity: 155.00 cm/s MV A velocity: 172.00 cm/s MV E/A ratio:  0.90 Armanda Magic MD Electronically signed by Armanda Magic MD Signature Date/Time: 08/27/2019/12:03:05 PM    Final     PHYSICAL EXAM    Temp:  [98.7 F (37.1 C)-100.9 F (38.3 C)] 99.7 F (37.6 C) (06/17 0800) Pulse Rate:   [78-110] 99 (06/17 0800) Resp:  [16-35] 21 (06/17 0800) BP: (106-181)/(47-114) 131/55 (06/17 0800) SpO2:  [93 %-100 %] 100 % (06/17 0800) Arterial Line BP: (135-219)/(16-67) 165/58 (06/17 0800)  General - Well nourished, well developed, drowsy sleepy lethargic  Ophthalmologic - fundi not visualized due to noncooperation.  Cardiovascular - Regular rhythm and rate.  Neuro - drowsy sleepy difficulty arouse but per RN she was just had a bath and worked with speech so she was exhausted. However, she was answer all orientated questions and following commands earlier today. With forced eye opening, right gaze preference, not cross midline. Left neglect, not blinking to visual threat on the left. PERRL. Left facial droop. Left UE 1/5 and LLE 0/5 with pain stimulation. RUE 3/5 at least and RLE 2/5 with pain. Sensation, coordination and gait not tested.   ASSESSMENT/PLAN [name] is a 69  y.o. 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 MRI with scattered R MCA watershed infarcts, L frontal cortex and R cerebellar infarcts.   Intracranial Stenoses s/p angioplasty History of stroke  06/2018 R lentiform nucleus, external and extreme capsule infarct. mild periventricular small vessel disease. multiple uncontrolled risk factors. LDL 131 and A1C 11.4. OP w/u. Put on plavix but non-compliant. Intolerant to statins.   Found to have severe intracranial stenoses on MRA - referred to Dr. Corliss Skains.    10/2018 cerebral angio - 85% stenosis of the mid M1 segment of the right MCA. Severe high-grade approximately 90% stenosis of left VBJ, and of 60% just distal to the origin of the AICA. Approximately 50% stenosis at  the origin of the right ICA at the bulb, and also about 60% of the right ICA at the cervical petrous junction.   Plan for 11/2018 procedure but P2Y12 was high, plavix changed to brilinta and procedure postponed.   08/25/2019 (Dr. Corliss Skains) S/p R cervical ICA/Petrous jxn angioplasty 08/25/19 ->25% residual stenosis. Severe stenosis of RT MCA M 1 seg with improved flow post angioplasty.    post-procedure worsening L sided weakness and neglect  MRI with scattered R MCA watershed infarcts, L frontal cortex and R cerebellar infarcts.   Hematoma R Thigh Severe Acute Blood Loss Anemia  CT abd/pelvis 6/15 large R groin retroperitoneal hematoma iliac & inguinal mostly in muscle and soft tissue  Hgb 13.3->6.9->7->5.1->4.8->5.0->5.2  P2Y12 164  VVS consult Edilia Bo) - no procedure needed  Pt is a Jehov[employer]'s Witness - refuses all blood products including albumin, son confirmed  Aspirin and brilinta now on hold  Not safe for wound exploration d/t risk of additional blood loss  hgb drop with dilutional component d/t increased fluids to maintain stent patency  Femstop considered, not felt to be actively bleeding  Repeat CT abd/pelvis 6/16 no active bleeding  Treated with IV iron/erythropoietin following consult w/ [facility]  Stroke:   R MCA scattered watershed, L frontal and R cerebellar infarcts following R ICA/P2 jxn angioplasty   MRI  Scattered R MCA/watershed infarcts. L parietal cortex infarct. R cerebellar infarct.  MRA   Improved R ICA patency. Severe R M1 stenosis. High-grade L V4 and moderate mid BA stenosis.   CT head No progression of infarcts or hemorrhage  2D echo EF 60-65%. No source of embolus  aspirin 81 mg daily and Brilinta (ticagrelor) 90 mg bid prior to admission, continue hold given hmg   Therapy recommendations:  pending   Disposition:  pending   Acute Respiratory distress   Increasing O2 demand  Received one dose of lasix 6/16  On low  dose precedex -> off now   CCM on board  Hypotension  BP goal 120-160  PICC line placed  Treated w/ Max neo -> now off  BP now ~120  CCM onboard   . Long-term BP goal 130-150 given intracranial stenoses  Hyperlipidemia  Home meds:  Repatha & fish oil  Intolerant to statins  LDL 132 in March, goal < 70  Continue Repatha at discharge  Diabetes type II Uncontrolled  HgbA1c 11.2 in March, goal < 7.0  CBGs  SSI  Dysphagia . Secondary to stroke . NPO . Cortrak @ 50 & TF @ 25 . Speech on  board  Other Stroke Risk Factors  Advanced age  Overweight, Body mass index is 28.53 kg/m., recommend weight loss, diet and exercise as appropriate   Hx stroke/TIA (see above)  Family hx stroke (mother)  Snoring. OP sleep eval showed mild Obstructive sleep apnea overall but severe apnea during REM w/ CPAP recommended   Other Active Problems  Hx noncompliance  Metabolic enceaphalopath  AKI pre-renal in setting of anemia 0.93->1.78->1.12->0.96  Leukocytosis 11.8->11.4->11.6->13.1  Hypokalemia 4.5->3.5->3.4->3.7->3.5  Hx palpitations on coreg, event monitor ordered 09/2018 but never completed   Low grade fever Tmax 100.9  Hospital day # 3  Patient continues to be critically ill for the last 24 hours, continues to have severe anemia, drowsy sleepy with AMS, hypotension needed intermittent neo, respiratory distress needed CCM on board and I have adjusted her BP goal and IVF with TF, continued her neuro check and discussed with Dr. Corliss Skains and son.   This patient is critically ill due to stroke post procedure, severe anemia, hematoma on the thigh, hypotension and at significant risk of neurological worsening, death form severe anemia, bleeding, recurrent stroke, intracranial stenosis. This patient's care requires constant monitoring of vital signs, hemodynamics, respiratory and cardiac monitoring, review of multiple databases, neurological assessment, discussion with  family, other specialists and medical decision making of high complexity. I spent 35 minutes of neurocritical care time in the care of this patient.   Marvel Plan, MD PhD Stroke Neurology 08/28/2019 9:50 AM    To contact Stroke Continuity provider, please refer to [email or web address]. After hours, contact General Neurology

## 2019-08-28 NOTE — Progress Notes (Signed)
SLP Cancellation Note  Patient Details Name: Donna [name] MRN: [id number] DOB: [birth year]   Cancelled treatment:       Reason Eval/Treat Not Completed: Fatigue/lethargy limiting ability to participate    Mahala Menghini., M.A. CCC-SLP Acute Rehabilitation Services Pager [phone] Office [phone]  08/28/2019, 9:29 AM

## 2019-08-28 NOTE — Progress Notes (Signed)
NAME:  Donna [name], MRN:  [id number], DOB:  [birth year], LOS: 3 ADMISSION DATE:  08/25/2019, CONSULTATION DATE:  08/27/2019 REFERRING MD:  Dr. Cheral Marker, CHIEF COMPLAINT:  Hypotension/ ABLA  Brief History   14 yoF with recent CVA in April 2020 with residual severe high grade MCA stenosis admitted 6/14 s/p RT ICA cervical/ petrous junction balloon angioplasty who developed lethargy and increasing left sided weakness found to have new watershead infarcts thought possibly related to hypoperfusion vs post procedure.  Neurology following.  Started on vasopressor for goal SBP 140-160 for cerebral perfusion.  Also found to have Hgb drop 13-> 6.9, new AKI, found to have right thigh hematoma and mild to moderate right iliac and inguinal retroperitoneal bleed.  Vascular following.  She is a Jehovah witness and has refused all blood products. PCCM consulted to help with blood pressure management and possible need for additional vasopressor requirements.   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  Was placed on ASA/ brillinta.  Monitored in Neuro ICU.  Noted to have developed hypotension, increased left sided weakness and lethargy on 6/15 am with Hgb drop from 13.3 to 6.9 with new AKI.  Post MRI/ MRA showed scattered small acute infarcts along the right MCA/ watershed territory suspected to be resultant either post procedure vs hypoperfusion.  Neurology was consulted with recommendations to keep SBP > 140 for cerebral perfusion.  A CT abd/ pelvis was obtained given concern for retroperitoneal bleed which showed a mild to moderate  amount of retroperitoneal hematoma in the right iliac and inguinal regiones with moderate size hematoma noted the the soft tissues anterior to the musculature of the right hip; no definite intrapertioneal hemorrhage. INR, PT, and platelets rechecked and were wnl.  Vascular surgery was consulted with plans to observe given bleeding was mostly into the muscle and tissue.  She was started on neosynephrine to keep SBP goal > 140-160 for cerebral perfusion.  PCCM consulted to help with vasopressor support.  S/p 1L bolus  Repeat Hgb now 6.9-> 7-> 5.1.   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    Significant Diagnostic Tests:   6/15 MRA/ MRI brain  >> 1. Scattered small acute infarcts along the right MCA/watershed territory. Solitary small acute infarct in the left parietal cortex and right cerebellum. 2. Improved right ICA patency at the petrous segment. 3. Known severe right M1 segment stenosis. 4. 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  6/15 CT a/p wo contrast >> 1. Mild to moderate amount of retroperitoneal hematoma is noted in the right iliac and inguinal regions. Moderate size hematoma is noted in the soft tissues anterior to the musculature of the right hip. No definite intraperitoneal hemorrhage is noted. These results will be called to the ordering clinician or representative by the Radiologist  Assistant, and communication documented in the PACS or zVision Dashboard. 2. Moderate size fat containing periumbilical hernia. Aortic Atherosclerosis   CXR  6/17> increased interstitial opacities, possibly edema. BUL ASD. R PICC with tip in SVC, NGT reaches stomach   Micro Data:  6/10 SARS 2 >> neg 6/14 MRSA PCR >> neg  Antimicrobials:  6/14 cefazolin pre-op   Interim history/subjective:  CXR overnight with increased possible edema. Pt examined by bedside team for agitation, wheezing, increased O2 requirement.  Given lasix, duonebs and started on low-dose precedex.  This morning, weaned off of precedex, weaning O2   Objective   Blood pressure (!) 127/52, pulse 80, temperature 99.2 F (37.3 C), temperature source Axillary, resp. rate (!) 22, height 5\' 1"  (1.549 m), weight 68.5 kg, SpO2 99 %. CVP:  [12 mmHg-53 mmHg] 14 mmHg      Intake/Output Summary (Last 24 hours) at 08/28/2019 0810 Last data filed at 08/28/2019 0600 Gross per 24 hour  Intake 800.6 ml  Output 3950 ml  Net -3149.4 ml   Filed Weights   08/25/19 0705  Weight: 68.5 kg    Examination: General:  Elderly chronically ill appearing female, reclined in bed NAD  HEENT: [place]. Pink mmm. NGT secure. Anicteric sclera Neuro: Lethargic. Responds to voice. Oriented to person place time situation. PERRL. L sided neglect. CV: RRR s1s2 no rgm cap refill < 3 seconds. 2+ radial pulses  PULM:  Mild RUL rhonchi, improved with cough. Symmetrical chest expansion, even unlabored respiration.  GI: Soft obese, ndnt. + bowel sounds  Extremities: Symmetrical muscle bulk and tone. No obvious large joint deformity. R thigh edema.  Skin: Pink, moist skin beneath pannus fold. R thigh ecchymosis. C/d/w   Resolved Hospital Problem list   Hypotension   Assessment & Plan:   Acute encephalopathy -in setting of CNS depressing medications, R MCA CVA P -minimize CNS depressing medications as able -supportive care, CVA support per neurology  Acute respiratory failure with hypoxia -CXR with pulm edema, mild BUL opacities  -6/17 AM: protecting airway  P -Monitor airway protection, intubation risk   -Supplemental O2 as needed for SpO2 goal > 92% -Duonebs -Continue diuresis -Pulm hygiene   s/p RT ICA cer/petrous junction balloon angioplasty for severe stenosis. Post-procedure with residual stenosis of approx 25 %. Severe stenosis of RT MCA M1 seg with improved flow post angioplasty Right MCA watershed territory infarct Small left frontal cortex and right cerebellum  P:  - Per Neuro IR and Neurology - Pending further stroke workup - TTE, lipid panel, A1c  Acute blood loss anemia - CTA Abd/Pelvis with hematoma of the right groin and retroperitoneum with expansion to the right rectus femoris expansion. No active bleeding Jehovah's Witness - no blood products including albumin P -Recommend against trending H/H -- will not change management -limit lab draws as able, favor QOD  -Hold ASA/Brillinta -aranesp, feraheme  - PICC to be placed giving increasing vasopressor requirements -Appreciate VVS following for ongoing evaluation of R fem -- no evidence of further bleeding   AKI, improving  - etiology: Pre-renal in setting of anemia - baseline sCr 0.93 P:  - Continue supportive care - trend renal indices, I/O   Leukocytosis, mild -reactive vs infective  P:  -Monitor off abx -trend WBC, fever curve   DM, type II - uncontrolled P:  - SSI + Lantus + EN coverage   Hypokalemia, improved  P: -continue to monitor and replace PRN   Best practice:  Diet: NPO  Pain/Anxiety/Delirium protocol (if  indicated): n/a VAP protocol (if indicated): n/a DVT prophylaxis: SCDs only in setting of acute anemia/blood loss GI prophylaxis: Pepcid Glucose control: CBG q 4 Mobility: BR Code Status: Full  Family Communication: - son, Evaristo Bury at [phone] per primary team .  Disposition: ICU   Labs   CBC: Recent Labs  Lab 08/25/19 0715 08/25/19 0715 08/26/19 0555 08/26/19 0555 08/26/19 0732 08/26/19 2244 08/27/19 0230 08/27/19 0529 08/27/19 0546 08/28/19 0500 08/28/19 0505   WBC 4.8  --  11.4*  --  11.8*  --  11.6*  --   --  13.1*  --   NEUTROABS 2.4  --  8.9*  --  8.9*  --   --   --   --   --   --   HGB 13.3   < > 6.9*   < > 7.0*   < > 4.8* 5.0* 6.1* 5.2* 11.9*  HCT 42.2   < > 20.4*   < > 20.4*   < > 15.1* 16.1* 18.0* 16.5* 35.0*  MCV 90.2  --  91.1  --  89.9  --  91.5  --   --  93.2  --   PLT 283  --  206  --  223  --  182  --   --  177  --    < > = values in this interval not displayed.    Basic Metabolic Panel: Recent Labs  Lab 08/26/19 0555 08/27/19 0230 08/27/19 0546 08/28/19 0500 08/28/19 0505  NA 134* 141 144 143 144  K 4.5 3.5 3.4* 3.7 3.5  CL 103 114*  --  111  --   CO2 17* 19*  --  22  --   GLUCOSE 289* 189*  --  316*  --   BUN 31* 21  --  13  --   CREATININE 1.78* 1.12*  --  0.96  --   CALCIUM 8.0* 7.6*  --  8.3*  --   MG  --   --   --  1.9  --    GFR: Estimated Creatinine Clearance: 49.7 mL/min (by C-G formula based on SCr of 0.96 mg/dL). Recent Labs  Lab 08/26/19 0555 08/26/19 0732 08/27/19 0230 08/28/19 0500  WBC 11.4* 11.8* 11.6* 13.1*    Liver Function Tests: Recent Labs  Lab 08/27/19 0230 08/28/19 0500  AST 21 29  ALT 21 24  ALKPHOS 36* 46  BILITOT 0.1* 0.9  PROT 4.9* 5.7*  ALBUMIN 2.4* 2.7*   No results for input(s): LIPASE, AMYLASE in the last 168 hours. No results for input(s): AMMONIA in the last 168 hours.  ABG    Component Value Date/Time   PHART 7.423 08/28/2019 0505   PCO2ART 34.4 08/28/2019 0505   PO2ART 69 (L) 08/28/2019 0505   HCO3 22.4 08/28/2019 0505   TCO2 23 08/28/2019 0505   ACIDBASEDEF 1.0 08/28/2019 0505   O2SAT 94.0 08/28/2019 0505     Coagulation Profile: Recent Labs  Lab 08/26/19 1715 08/27/19 0230  INR 1.1 1.2    Cardiac Enzymes: No results for input(s): CKTOTAL, CKMB, CKMBINDEX, TROPONINI in the last 168 hours.  HbA1C: Hemoglobin A1C  Date/Time Value Ref Range Status  10/12/2017 12:00 AM 9.7  Final   Hgb A1c MFr Bld  Date/Time Value Ref Range Status  05/22/2019  01:12 PM 11.2 (H) 4.8 - 5.6 % Final    Comment:             Prediabetes: 5.7 - 6.4  Diabetes: >6.4          Glycemic control for adults with diabetes: <7.0   11/27/2018 07:27 AM 11.3 (H) 4.8 - 5.6 % Final    Comment:    (NOTE)         Prediabetes: 5.7 - 6.4         Diabetes: >6.4         Glycemic control for adults with diabetes: <7.0     CBG: Recent Labs  Lab 08/27/19 1701 08/27/19 1909 08/27/19 2314 08/28/19 0305 08/28/19 0731  GLUCAP 178* 177* 146* 224* 256*     CRITICAL CARE Performed by: Lanier Clam   Total critical care time: 40 minutes  Critical care time was exclusive of separately billable procedures and treating other patients. Critical care was necessary to treat or prevent imminent or life-threatening deterioration.  Critical care was time spent personally by me on the following activities: development of treatment plan with patient and/or surrogate as well as nursing, discussions with consultants, evaluation of patient's response to treatment, examination of patient, obtaining history from patient or surrogate, ordering and performing treatments and interventions, ordering and review of laboratory studies, ordering and review of radiographic studies, pulse oximetry and re-evaluation of patient's condition.  Tessie Fass MSN, AGACNP-BC [facility] [phone] If no answer, [phone] 08/28/2019, 8:10 AM

## 2019-08-28 NOTE — Progress Notes (Signed)
OT Cancellation Note  Patient Details Name: Donna [name] MRN: [id number] DOB: 04-29-1950   Cancelled Treatment:    Reason Eval/Treat Not Completed: Patient not medically ready.  Will reattempt.  Eber Jones., OTR/L Acute Rehabilitation Services Pager [phone] Office [phone]   Jeani Hawking M 08/28/2019, 10:18 AM

## 2019-08-28 NOTE — Progress Notes (Signed)
RT obtained ABG on pt with the following results. RT will continue to monitor.   Results for Donna [name], Donna [name] (MRN [id number]) as of 08/28/2019 05:15  Ref. Range 08/28/2019 05:05  Sample type Unknown ARTERIAL  pH, Arterial Latest Ref Range: 7.35 - 7.45  7.423  pCO2 arterial Latest Ref Range: 32 - 48 mmHg 34.4  pO2, Arterial Latest Ref Range: 83 - 108 mmHg 69 (L)  TCO2 Latest Ref Range: 22 - 32 mmol/L 23  Acid-base deficit Latest Ref Range: 0.0 - 2.0 mmol/L 1.0  Bicarbonate Latest Ref Range: 20.0 - 28.0 mmol/L 22.4  O2 Saturation Latest Units: % 94.0  Patient temperature Unknown 99.2 F  Collection site Unknown art line

## 2019-08-28 NOTE — Progress Notes (Addendum)
Requested by [facility] physician that I evaluate patient.  Has been agitated and uncomfortable appearing for much of the night, now more hypertensive (180 systolic at 3am), now with wheeze and some tachypnea, increasing O2 need.  CXR still appears stable from this am, mild interstitial infiltrates (very mild) no effusion.  ABG P.  Wheezing diffusely.  Awake, answers simple questions.  Asking for water.   Nebs treatment pending (about to start).  Duonebs wwere helpful earlier today when she was wheezing.   I will also treat HTN,  Trial of low dose of hydralazine.  HTN could be temporarily worsening pulm edema. Will repeat dose of lasix as UOP has slowed down a bit. She was diuresed today, but till net positive over a liter for this hospitalization.   HHF nasal canula vs NRB - whichever she will tolerate.   Trial of low dose precedex for agitation.

## 2019-08-28 NOTE — Progress Notes (Signed)
Referring Physician(s): Dr. 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 Estanislado Pandy. R groin/RP hematoma with critically low hemoglobin  Subjective: Patient arousable.  Alert and able to answer questions appropriately, however becomes quickly short of breath with minimal exertion.  Per RN, requiring precedex overnight for agitiation and restlessness.  Being weaned this AM and now remains lethargic.  Groin stable.  Firmness to mid thigh noted, no new bruising, bleeding.  Hemoglobin stable at 5.2 this AM after diuresis yesterday.  No family at bedside this AM.  When asked if she is continuing to desire no transfusion/blood products be given, she states "No blood."  Allergies: Patient has no known allergies.  Medications: Prior to Admission medications   Medication Sig Start Date End Date Taking? Authorizing Provider  amLODipine (NORVASC) 10 MG tablet Take 1 tablet (10 mg total) by mouth daily. 05/12/19  Yes Minette Brine, FNP  aspirin EC 81 MG tablet Take 81 mg by mouth every evening.   Yes [provider]  Calcium Carbonate-Vitamin D (CALCIUM-D PO) Take 2 tablets by mouth daily at 12 noon.    Yes [provider]  carvedilol (COREG) 6.25 MG tablet TAKE 1 TABLET(6.25 MG) BY MOUTH TWICE DAILY Patient taking differently: Take 6.25 mg by mouth 2 (two) times daily with a meal.  07/31/19  Yes Minette Brine, FNP  hydrALAZINE (APRESOLINE) 50 MG tablet Take 1 tablet (50 mg total) by mouth 2 (two) times daily. 06/16/19 09/14/19 Yes Elouise Munroe, MD  Insulin Degludec-Liraglutide (XULTOPHY) 100-3.6 UNIT-MG/ML SOPN Inject 30 Units into the skin daily. 08/14/19  Yes Minette Brine, FNP  telmisartan-hydrochlorothiazide (MICARDIS HCT) 40-12.5 MG tablet Take 1 tablet by mouth daily.  07/31/19  Yes Minette Brine, FNP  ticagrelor (BRILINTA) 90 MG TABS tablet Take 1 tablet (90 mg total) by mouth 2 (two) times daily. 06/12/19  Yes Elouise Munroe, MD  vitamin C (ASCORBIC ACID) 250 MG tablet Take 500 mg by mouth daily at 12 noon.   Yes [provider]  blood glucose meter kit and supplies KIT Dispense based on patient and insurance preference. Use up to four times daily as directed. (FOR ICD-9 250.00, 250.01). 05/10/18   Minette Brine, FNP  Blood Glucose Monitoring Suppl (TRUE METRIX METER) w/Device KIT 1 each by Does not apply route in the morning, at noon, in the evening, and at bedtime. 05/22/19   Minette Brine, FNP  Evolocumab (REPATHA SURECLICK) 742 MG/ML SOAJ Inject 140 mg into the skin every 14 (fourteen) days. 06/30/19   Elouise Munroe, MD  glucose blood (TRUE METRIX BLOOD GLUCOSE TEST) test strip Check blood sugar 4 times a day before meals and bedtime 05/22/19   Minette Brine, FNP  Insulin Glargine-Lixisenatide (SOLIQUA) 100-33 UNT-MCG/ML SOPN Inject 25 Units into the skin daily. Patient taking differently: Inject 30 Units into the skin daily.  07/14/19   Minette Brine, FNP  Multiple Vitamin (MULTIVITAMIN PO) Take 1 tablet by mouth daily at 12 noon.     [provider]  Omega-3 Fatty Acids (OMEGA-3 PLUS PO) Take 2 capsules by mouth daily at 12 noon.    [provider]     Vital Signs: BP (!) 127/52   Pulse 80   Temp 99.7 F (37.6 C) (Axillary)   Resp (!) 22   Ht 5' 1"  (1.549 m)   Wt 151  lb (68.5 kg)   SpO2 99%   BMI 28.53 kg/m   Physical Exam  NAD, alert, appears uncomfortable at time, but denies pain. Chest: increased WOB, audible expiratory wheezing, rhonchi throughout Heart: RRR Groin: R groin puncture site soft, some firmness noted to mid thigh however medial thigh more compressible and soft.  No extension of bruising.  No active oozing/bleeding. Neuro: now weaning from precedex, lethargic/drowsy.  Answers questions appropriately,  but unable to follow commands consistently due to lethargy.  No movement noted on left although attempt is made.   Imaging: CT ABDOMEN PELVIS WO CONTRAST  Result Date: 08/26/2019 CLINICAL DATA:  Abdominal distension. EXAM: CT ABDOMEN AND PELVIS WITHOUT CONTRAST TECHNIQUE: Multidetector CT imaging of the abdomen and pelvis was performed following the standard protocol without IV contrast. COMPARISON:  None. FINDINGS: Lower chest: No acute abnormality. Hepatobiliary: No focal liver abnormality is seen. No gallstones, gallbladder wall thickening, or biliary dilatation. Pancreas: Unremarkable. No pancreatic ductal dilatation or surrounding inflammatory changes. Spleen: Normal in size without focal abnormality. Adrenals/Urinary Tract: Adrenal glands and kidneys are unremarkable. No hydronephrosis or renal obstruction is noted. Foley catheter is noted within urinary bladder. Stomach/Bowel: Stomach is within normal limits. Appendix appears normal. No evidence of bowel wall thickening, distention, or inflammatory changes. Vascular/Lymphatic: Atherosclerosis of abdominal aorta is noted without aneurysm formation. There does appear to be a mild to moderate amount of retroperitoneal hematoma in the right iliac and inguinal regions. Moderate size hematoma is noted in the soft tissues anterior to the musculature of the right hip. No definite intraperitoneal hemorrhage is noted. Reproductive: Status post hysterectomy. No adnexal masses. Other: Moderate size fat containing periumbilical hernia is noted. No ascites is noted. Musculoskeletal: No acute or significant osseous findings. IMPRESSION: 1. Mild to moderate amount of retroperitoneal hematoma is noted in the right iliac and inguinal regions. Moderate size hematoma is noted in the soft tissues anterior to the musculature of the right hip. No definite intraperitoneal hemorrhage is noted. These results will be called to the ordering clinician or representative by the  Radiologist Assistant, and communication documented in the PACS or zVision Dashboard. 2. Moderate size fat containing periumbilical hernia. Aortic Atherosclerosis (ICD10-I70.0). Electronically Signed   By: Marijo Conception M.D.   On: 08/26/2019 16:31   CT HEAD WO CONTRAST  Result Date: 08/27/2019 CLINICAL DATA:  Evaluate for source of hemorrhage EXAM: CT HEAD WITHOUT CONTRAST TECHNIQUE: Contiguous axial images were obtained from the base of the skull through the vertex without intravenous contrast. COMPARISON:  Brain MRI from yesterday FINDINGS: Brain: Small infarcts by brain MRI are largely occult on this study. Remote perforator infarct at the right basal ganglia and lateral left cerebellum. Mild cerebral volume loss. No hemorrhage, hydrocephalus, or masslike finding. Vascular: No hyperdense vessel or unexpected calcification. Skull: Normal. Negative for fracture or focal lesion. Sinuses/Orbits: Chronic left maxillary sinusitis with complete opacification. Patchy mucosal thickening elsewhere. IMPRESSION: 1. Known acute infarcts that are underestimated by CT. No detected progression or acute hemorrhage. 2. 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 Electronically Signed   By: Monte Fantasia M.D.   On: 08/27/2019 06:40   MR ANGIO HEAD WO CONTRAST  Result Date: 08/26/2019 CLINICAL DATA:  Right ICA angioplasty with left-sided weakness EXAM: MRI HEAD WITHOUT CONTRAST MRA HEAD WITHOUT CONTRAST TECHNIQUE: Multiplanar, multiecho pulse sequences of the brain and surrounding structures were obtained without intravenous contrast. Angiographic images of the head were obtained using MRA technique without contrast. COMPARISON:  10/09/2018 FINDINGS: MRI HEAD  FINDINGS Brain: Small (subcentimeter) scattered cortical and white matter infarcts along the right cerebral convexity and in the high left parietal cortex. On the right these are in a roughly watershed distribution. Small acute infarct in the upper  right cerebellum. Small remote left cerebellar infarct Remote perforator infarct at the right basal ganglia. Mild chronic small vessel ischemic change in the cerebral white matter. No hydrocephalus or collection. Vascular: See below Skull and upper cervical spine: Normal marrow signal Sinuses/Orbits: Chronic left maxillary sinusitis with complete opacification also seen in 2020. MRA HEAD FINDINGS The right ICA is smaller than the left. Only mild narrowing at the right cervical petrous segment, an improvement. Severe right MCA stenosis with patent downstream vasculature. No left-sided or anterior cerebral stenosis or branch occlusion. High-grade narrowing at the non dominant right V4 segment. Moderate mid basilar stenosis. Patent bilateral PCA. IMPRESSION: 1. Scattered small acute infarcts along the right MCA/watershed territory. Solitary small acute infarct in the left parietal cortex and right cerebellum. 2. Improved right ICA patency at the petrous segment. 3. Known severe right M1 segment stenosis. 4. 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 Electronically Signed   By: Monte Fantasia M.D.   On: 08/26/2019 11:05   MR BRAIN WO CONTRAST  Result Date: 08/26/2019 CLINICAL DATA:  Right ICA angioplasty with left-sided weakness EXAM: MRI HEAD WITHOUT CONTRAST MRA HEAD WITHOUT CONTRAST TECHNIQUE: Multiplanar, multiecho pulse sequences of the brain and surrounding structures were obtained without intravenous contrast. Angiographic images of the head were obtained using MRA technique without contrast. COMPARISON:  10/09/2018 FINDINGS: MRI HEAD FINDINGS Brain: Small (subcentimeter) scattered cortical and white matter infarcts along the right cerebral convexity and in the high left parietal cortex. On the right these are in a roughly watershed distribution. Small acute infarct in the upper right cerebellum. Small remote left cerebellar infarct Remote perforator infarct at the right basal ganglia. Mild  chronic small vessel ischemic change in the cerebral white matter. No hydrocephalus or collection. Vascular: See below Skull and upper cervical spine: Normal marrow signal Sinuses/Orbits: Chronic left maxillary sinusitis with complete opacification also seen in 2020. MRA HEAD FINDINGS The right ICA is smaller than the left. Only mild narrowing at the right cervical petrous segment, an improvement. Severe right MCA stenosis with patent downstream vasculature. No left-sided or anterior cerebral stenosis or branch occlusion. High-grade narrowing at the non dominant right V4 segment. Moderate mid basilar stenosis. Patent bilateral PCA. IMPRESSION: 1. Scattered small acute infarcts along the right MCA/watershed territory. Solitary small acute infarct in the left parietal cortex and right cerebellum. 2. Improved right ICA patency at the petrous segment. 3. Known severe right M1 segment stenosis. 4. 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 Electronically Signed   By: Monte Fantasia M.D.   On: 08/26/2019 11:05   DG CHEST PORT 1 VIEW  Result Date: 08/28/2019 CLINICAL DATA:  Hypoxia EXAM: PORTABLE CHEST 1 VIEW COMPARISON:  Yesterday FINDINGS: Increased generalized interstitial opacity. There is also more dense hazy opacity over the upper lungs, usually scarring in this location but not seen in 2020. Right PICC with tip at the SVC. Feeding tube which reaches the stomach IMPRESSION: Increased interstitial opacity, question edema. There also appears to be airspace disease in the upper lobes, infection not excluded. Electronically Signed   By: Monte Fantasia M.D.   On: 08/28/2019 04:13   DG CHEST PORT 1 VIEW  Result Date: 08/27/2019 CLINICAL DATA:  Wheezing EXAM: PORTABLE CHEST 1 VIEW COMPARISON:  11/29/2018 FINDINGS:  Esophageal tube tip is below the diaphragm but incompletely visualized. Right upper extremity central venous catheter tip over the SVC. Mild diffuse interstitial opacity with subtle  nodularity. Stable cardiomediastinal silhouette with aortic atherosclerosis. No pneumothorax. IMPRESSION: Mild diffuse increased interstitial opacity with subtle nodularity, potentially due to atypical infection. Electronically Signed   By: Donavan Foil M.D.   On: 08/27/2019 19:20   ECHOCARDIOGRAM COMPLETE  Result Date: 08/27/2019    ECHOCARDIOGRAM REPORT   Patient Name:   Donna [name] Date of Exam: 08/27/2019 Medical Rec #:  [id number]       Height:       61.0 in Accession #:    [id number]      Weight:       151.0 lb Date of Birth:  August 26, 1950       BSA:          1.676 m Patient Age:    39 years        BP:           117/53 mmHg Patient Gender: F               HR:           106 bpm. Exam Location:  Inpatient Procedure: 2D Echo Indications:    Stroke  History:        Patient has prior history of Echocardiogram examinations, most                 recent 10/08/2018. Risk Factors:Hypertension, Dyslipidemia and                 Diabetes.  Sonographer:    Mikki Santee RDCS (AE) Referring Phys: [id number] [place]  1. Left ventricular ejection fraction, by estimation, is 60 to 65%. The left ventricle has normal function. The left ventricle has no regional wall motion abnormalities. There is moderate left ventricular hypertrophy of the basal-septal segment. Left ventricular diastolic function could not be evaluated.  2. Right ventricular systolic function is normal. The right ventricular size is normal. Tricuspid regurgitation signal is inadequate for assessing PA pressure.  3. The mitral valve is normal in structure. Trivial mitral valve regurgitation. No evidence of mitral stenosis.  4. The aortic valve was not well visualized. Aortic valve regurgitation is not visualized. No aortic stenosis is present.  5. The inferior vena cava is normal in size with greater than 50% respiratory variability, suggesting right atrial pressure of 3 mmHg. FINDINGS  Left Ventricle: Left ventricular ejection fraction, by  estimation, is 60 to 65%. The left ventricle has normal function. The left ventricle has no regional wall motion abnormalities. The left ventricular internal cavity size was normal in size. There is  moderate left ventricular hypertrophy of the basal-septal segment. Left ventricular diastolic function could not be evaluated. Right Ventricle: The right ventricular size is normal. No increase in right ventricular wall thickness. Right ventricular systolic function is normal. Tricuspid regurgitation signal is inadequate for assessing PA pressure. Left Atrium: Left atrial size was normal in size. Right Atrium: Right atrial size was normal in size. Pericardium: There is no evidence of pericardial effusion. Mitral Valve: The mitral valve is normal in structure. Normal mobility of the mitral valve leaflets. Severe mitral annular calcification. Trivial mitral valve regurgitation. No evidence of mitral valve stenosis. MV peak gradient, 18.5 mmHg. The mean mitral valve gradient is 9.0 mmHg. Tricuspid Valve: The tricuspid valve is normal in structure. Tricuspid valve regurgitation is not demonstrated. No evidence of tricuspid stenosis.  Aortic Valve: The aortic valve was not well visualized. . There is moderate thickening and moderate calcification of the aortic valve. Aortic valve regurgitation is not visualized. No aortic stenosis is present. There is moderate thickening of the aortic  valve. There is moderate calcification of the aortic valve. Pulmonic Valve: The pulmonic valve was normal in structure. Pulmonic valve regurgitation is not visualized. No evidence of pulmonic stenosis. Aorta: The aortic root is normal in size and structure. Venous: The inferior vena cava is normal in size with greater than 50% respiratory variability, suggesting right atrial pressure of 3 mmHg. IAS/Shunts: No atrial level shunt detected by color flow Doppler.  LEFT VENTRICLE PLAX 2D LVIDd:         4.20 cm LVIDs:         2.90 cm LV PW:          1.10 cm LV IVS:        1.30 cm LVOT diam:     2.00 cm LV SV:         45 LV SV Index:   27 LVOT Area:     3.14 cm  RIGHT VENTRICLE RV S prime:     10.90 cm/s LEFT ATRIUM             Index       RIGHT ATRIUM          Index LA diam:        3.00 cm 1.79 cm/m  RA Area:     5.38 cm LA Vol (A2C):   37.0 ml 22.07 ml/m RA Volume:   7.65 ml  4.56 ml/m LA Vol (A4C):   23.0 ml 13.72 ml/m LA Biplane Vol: 31.4 ml 18.73 ml/m  AORTIC VALVE LVOT Vmax:   90.60 cm/s LVOT Vmean:  63.800 cm/s LVOT VTI:    0.142 m  AORTA Ao Root diam: 2.80 cm MITRAL VALVE MV Area (PHT): 4.49 cm     SHUNTS MV Peak grad:  18.5 mmHg    Systemic VTI:  0.14 m MV Mean grad:  9.0 mmHg     Systemic Diam: 2.00 cm MV Vmax:       2.15 m/s MV Vmean:      142.0 cm/s MV Decel Time: 169 msec MV E velocity: 155.00 cm/s MV A velocity: 172.00 cm/s MV E/A ratio:  0.90 Fransico Him MD Electronically signed by Fransico Him MD Signature Date/Time: 08/27/2019/12:03:05 PM    Final    [place] EKG SITE RITE  Result Date: 08/27/2019 If Site Rite image not attached, placement could not be confirmed due to current cardiac rhythm.  CT Angio Abd/Pel w/ and/or w/o  Result Date: 08/27/2019 CLINICAL DATA:  Acute blood loss anemia.  Recent arteriogram. EXAM: CTA ABDOMEN AND PELVIS WITHOUT AND WITH CONTRAST TECHNIQUE: Multidetector CT imaging of the abdomen and pelvis was performed using the standard protocol during bolus administration of intravenous contrast. Multiplanar reconstructed images and MIPs were obtained and reviewed to evaluate the vascular anatomy. CONTRAST:  133m OMNIPAQUE IOHEXOL 350 MG/ML SOLN COMPARISON:  Yesterday FINDINGS: VASCULAR Aorta: Diffuse atheromatous plaque.  Negative for aneurysm Celiac: Plaque at the origin moderate narrowing. No branch occlusion. SMA: Diffusely patent without stenosis or dissection Renals: Accessory left upper pole renal artery. There is atherosclerotic plaque without proximal high-grade stenosis. IMA: Patent Inflow: Atheromatous  plaque. Proximal Outflow: No visible irregularity of common femoral or visible superficial femoral arteries. Veins: Hemorrhage tracks along the external iliac vein extends into the pelvis, overall small volume for  the procedure. Review of the MIP images confirms the above findings. NON-VASCULAR Lower chest: Partially covered opacity at the right lower lobe with trace left pleural effusion. The airways appear cuffed and there are a few thickened interlobular septae a. Hepatobiliary: No focal liver abnormality.Contrast excretion into the gallbladder. Pancreas: Unremarkable. Spleen: Unremarkable. Adrenals/Urinary Tract: Negative adrenals. No hydronephrosis or stone. Unremarkable bladder. Stomach/Bowel:  No obstruction. No appendicitis. Lymphatic: No mass or adenopathy.  Fatty umbilical hernia. Reproductive:No pathologic findings. Other: No ascites or pneumoperitoneum. Musculoskeletal: No acute abnormalities. Lower lumbar disc bulging and facet spurring. Case discussed with Dr. Scot Dock, fortunately the thigh remain soft. IMPRESSION: 1. No visualized active bleeding. There is unchanged and relatively mild blood products in the right groin and retroperitoneum. There was right rectus femoris expansion and hemorrhage on the preceding abdominal CT, less well covered today. 2. Mild pulmonary edema. Electronically Signed   By: Monte Fantasia M.D.   On: 08/27/2019 07:02    Labs:  CBC: Recent Labs    08/26/19 0555 08/26/19 0555 08/26/19 0732 08/26/19 2244 08/27/19 0230 08/27/19 0230 08/27/19 0529 08/27/19 0546 08/28/19 0500 08/28/19 0505  WBC 11.4*  --  11.8*  --  11.6*  --   --   --  13.1*  --   HGB 6.9*   < > 7.0*   < > 4.8*   < > 5.0* 6.1* 5.2* 11.9*  HCT 20.4*   < > 20.4*   < > 15.1*   < > 16.1* 18.0* 16.5* 35.0*  PLT 206  --  223  --  182  --   --   --  177  --    < > = values in this interval not displayed.    COAGS: Recent Labs    11/27/18 0728 08/18/19 0705 08/26/19 1715 08/27/19 0230   INR 0.9 0.9 1.1 1.2    BMP: Recent Labs    08/18/19 0705 08/18/19 0705 08/26/19 0555 08/26/19 0555 08/27/19 0230 08/27/19 0546 08/28/19 0500 08/28/19 0505  NA 136   < > 134*   < > 141 144 143 144  K 3.6   < > 4.5   < > 3.5 3.4* 3.7 3.5  CL 101  --  103  --  114*  --  111  --   CO2 25  --  17*  --  19*  --  22  --   GLUCOSE 211*  --  289*  --  189*  --  316*  --   BUN 26*  --  31*  --  21  --  13  --   CALCIUM 9.7  --  8.0*  --  7.6*  --  8.3*  --   CREATININE 0.93  --  1.78*  --  1.12*  --  0.96  --   GFRNONAA >60  --  29*  --  50*  --  >60  --   GFRAA >60  --  33*  --  58*  --  >60  --    < > = values in this interval not displayed.    LIVER FUNCTION TESTS: Recent Labs    10/07/18 1259 05/22/19 1312 08/27/19 0230 08/28/19 0500  BILITOT 0.3 0.3 0.1* 0.9  AST 17 15 21 29   ALT 20 19 21 24   ALKPHOS 73 74 36* 46  PROT 7.3 7.5 4.9* 5.7*  ALBUMIN 4.1 4.0 2.4* 2.7*    Assessment and Plan: Right ICA cervical/petrous junction stenosis s/p revascularization using balloon  angioplasty via right radial and right femoral approach6/14/2021by Dr. Estanislado Pandy. Mild-moderate R RP hematoma, moderate right thigh/soft tissue hematoma.  Patient with continued lethargy, no movement on the left side.  Groin/thigh stable this AM.  No new bruising, no signs of active bleeding. CT Abdomen Pelvis repeated yesterday and shows no new bleeding. Vascular surgery has seen this AM and recommends careful monitoring, but no intervention.  Hemoglobin stable at 5.2 this AM Now with increased respiratory compromise noted by increased WOB, audible wheezing, and diffuse rhonchi.  CXR this AM shows opacity, possible edema. CCM following.  Continuing duonebs, diuresis, and gentle hydration.  She has started tube feeds.   Agree with minimizing lab draws.   No family at bedside this AM.   Continue to hold Brilinta/Aspirin.  Appreciate CCM/neurology/TRH/vascular surgery assistance with management. NIR  to follow.  Electronically Signed: Docia Barrier, PA 08/28/2019, 8:44 AM   I spent a total of 15 Minutes at the the patient's bedside AND on the patient's hospital floor or unit, greater than 50% of which was counseling/coordinating care for R ICA stenosis, R RP hematoma, R thigh hematoma.

## 2019-08-28 NOTE — Progress Notes (Signed)
PROGRESS NOTE  Donna [name] ZOX:[id number] DOB: July 01, 1950 DOA: 08/25/2019 PCP: Arnette Felts, FNP   LOS: 3 days   Brief narrative: As per HPI and previous provider,  Donna [name] is an 69 y.o. female with history of  CVA (2020); OSA; DM type II; essential HTN; and hyperlipidemia who was admitted by interventional radiology on 6/14 status post R ICA angioplasty/stent placement.  There was report of acute on chronic left-sided weakness with altered mentation with significant weakness on the left side.  Hospitalist team was consulted for further evaluation  Assessment/Plan:   Principal Problem:   Internal carotid artery stenosis, right Active Problems:   Essential hypertension   Hyperlipidemia   Cerebral infarction (HCC)   Type 2 diabetes mellitus with vascular disease (HCC)   AKI (acute kidney injury) (HCC)   Anemia  Right ICA stenosis -status post revascularization of Right  ICA stenosis via angioplasty. Vascular surgery on board.   Increasing oxygen demand with acute hypoxic respiratory failure.  Patient received IV Lasix, DuoNeb's.  Chest x-ray from 08/28/2019 showed increased interstitial opacity question edema/airspace disease in the upper lobes infection not excluded.  Patient had positive balance.  Could consider Lasix daily.  Low-grade fever.  Temperature max of 100.9 F on 08/27/2019 at 8 PM.  Need to closely monitor.  There is high risk of aspiration secondary to decreased mentation.  Consider blood cultures if continues to spike fever plus antibiotic.  Metabolic encephalopathy.  Likely multifactorial from hypoperfusion, severe anemia, CA watershed infarct, left parietal and cerebellar infarct.  Seen appears to be lethargic with no significant response.  Prognosis is guarded.  Acute CVA Patient with prior h/o CVA with left-sided deficits. Despite right ICA angioplasty, weakness progressed with altered mental status. MRI/MRA confirmed a small solitary infract in Left  parietal cortex and right cerebellum as well as scattered small acute infarcts along the R MCA/watershed territory.  Neurology on board.  Continue physical therapy, occupational therapy.  Platelets currently on hold.  CT angiogram of the abdomen and pelvis with right groin and retroperitoneal hematoma.  Neurology on board.  Acute blood loss anemia Likely secondary to groin and retroperitoneal hematoma.  Hemoglobin of 5.02 today.  Patient is Jehovah's Witness.  Family wishing erythropoietin and iron infusion in the meantime.    Acute kidney injury. Improved. Creatinine of 0.9.  Likely secondary to hypoperfusion.  Essential HTN Currently not on any antihypertensives. Needed hydralazine.  Hyperlipidemia -Patient is on repatha for statin-intolerant (myopathy) as outpatient.  DM type II A1c was 11.2 on 3/11. On [place] as an outpatient,on hold. continue on SSI, basal added yesterday at Lantus 10 units .  Will closely monitor blood glucose levels.   Leukocytosis WBC of 13.1 today from 11.6.  Off antibiotic.  Will need to closely monitor.  Hypokalemia.  Replenished.  Potassium of 3.7 today.   Prognosis.  Prognosis of the patient is guarded/poor due to severe anemia, full infarct, encephalopathy, poor nutritional status, lack of clinical improvement  VTE Prophylaxis: Sequential compression device  Code Status: Full code  Family Communication: None today. Spoke with the patient's sister at bedside.  Status is: Inpatient  Remains inpatient appropriate because: Unsafe d/c plan, Inpatient level of care appropriate due to severity of illness and Severe anemia, respiratory failure , metabolic encephalopathy, acute stroke  Dispo: The patient is from: Home              Anticipated d/c is to: Undetermined.  Anticipated d/c date is: > 3 days              Patient currently is not medically stable to d/c.    Consultants:  Neurology  Critical  care  Procedures:  Right ICA angioplasty  Antibiotics:  . none  Anti-infectives (From admission, onward)   Start     Dose/Rate Route Frequency Ordered Stop   08/25/19 0723  ceFAZolin (ANCEF) IVPB 2g/100 mL premix        2 g 200 mL/hr over 30 Minutes Intravenous 60 min pre-op 08/25/19 0723 08/25/19 0930     Subjective: Today, patient was seen and examined at bedside.  Appears to be lethargic.  Not much responsive.  Objective: Vitals:   08/28/19 0500 08/28/19 0600  BP: (!) 141/66 (!) 127/52  Pulse: 100 80  Resp: (!) 28 (!) 22  Temp:    SpO2: 93% 99%    Intake/Output Summary (Last 24 hours) at 08/28/2019 0732 Last data filed at 08/28/2019 0600 Gross per 24 hour  Intake 800.6 ml  Output 3950 ml  Net -3149.4 ml   Filed Weights   08/25/19 0705  Weight: 68.5 kg   Body mass index is 28.53 kg/m.   Physical Exam:  GENERAL: Patient is lethargic, not answering questions.  On nasal cannula oxygen.  Appears chronically ill HENT: Pallor noted.  Oral mucosa is moist.  Trach tube in place. NECK: is supple, no gross swelling noted. CHEST: Scattered wheezes, diminished breath sounds bilaterally.  Coarse breath sounds noted. CVS: S1 and S2 heard, no murmur. Regular rate and rhythm.  ABDOMEN: Soft, non-tender, bowel sounds are present. EXTREMITIES: Left hemiplegia.   Right thigh with marking.  No obvious discoloration.  Right upper extremity PICC line in place. CNS: Left-sided hemiparesis.  Lethargic SKIN: warm and dry without rashes.  Data Review: I have personally reviewed the following laboratory data and studies,  CBC: Recent Labs  Lab 08/25/19 0715 08/25/19 0715 08/26/19 0555 08/26/19 0555 08/26/19 0732 08/26/19 2244 08/27/19 0230 08/27/19 0529 08/27/19 0546 08/28/19 0500 08/28/19 0505  WBC 4.8  --  11.4*  --  11.8*  --  11.6*  --   --  13.1*  --   NEUTROABS 2.4  --  8.9*  --  8.9*  --   --   --   --   --   --   HGB 13.3   < > 6.9*   < > 7.0*   < > 4.8* 5.0*  6.1* 5.2* 11.9*  HCT 42.2   < > 20.4*   < > 20.4*   < > 15.1* 16.1* 18.0* 16.5* 35.0*  MCV 90.2  --  91.1  --  89.9  --  91.5  --   --  93.2  --   PLT 283  --  206  --  223  --  182  --   --  177  --    < > = values in this interval not displayed.   Basic Metabolic Panel: Recent Labs  Lab 08/26/19 0555 08/27/19 0230 08/27/19 0546 08/28/19 0500 08/28/19 0505  NA 134* 141 144 143 144  K 4.5 3.5 3.4* 3.7 3.5  CL 103 114*  --  111  --   CO2 17* 19*  --  22  --   GLUCOSE 289* 189*  --  316*  --   BUN 31* 21  --  13  --   CREATININE 1.78* 1.12*  --  0.96  --  CALCIUM 8.0* 7.6*  --  8.3*  --   MG  --   --   --  1.9  --    Liver Function Tests: Recent Labs  Lab 08/27/19 0230 08/28/19 0500  AST 21 29  ALT 21 24  ALKPHOS 36* 46  BILITOT 0.1* 0.9  PROT 4.9* 5.7*  ALBUMIN 2.4* 2.7*   No results for input(s): LIPASE, AMYLASE in the last 168 hours. No results for input(s): AMMONIA in the last 168 hours. Cardiac Enzymes: No results for input(s): CKTOTAL, CKMB, CKMBINDEX, TROPONINI in the last 168 hours. BNP (last 3 results) No results for input(s): BNP in the last 8760 hours.  ProBNP (last 3 results) No results for input(s): PROBNP in the last 8760 hours.  CBG: Recent Labs  Lab 08/27/19 1153 08/27/19 1701 08/27/19 1909 08/27/19 2314 08/28/19 0305  GLUCAP 160* 178* 177* 146* 224*   Recent Results (from the past 240 hour(s))  SARS CORONAVIRUS 2 (TAT 6-24 HRS) Nasopharyngeal Nasopharyngeal Swab     Status: None   Collection Time: 08/21/19 12:45 PM   Specimen: Nasopharyngeal Swab  Result Value Ref Range Status   SARS Coronavirus 2 NEGATIVE NEGATIVE Final    Comment: (NOTE) SARS-CoV-2 target nucleic acids are NOT DETECTED.  The SARS-CoV-2 RNA is generally detectable in upper and lower respiratory specimens during the acute phase of infection. Negative results do not preclude SARS-CoV-2 infection, do not rule out co-infections with other pathogens, and should not be  used as the sole basis for treatment or other patient management decisions. Negative results must be combined with clinical observations, patient history, and epidemiological information. The expected result is Negative.  Fact Sheet for Patients: HairSlick.nohttps://www.fda.gov/media/138098/download  Fact Sheet for Healthcare Providers: quierodirigir.comhttps://www.fda.gov/media/138095/download  This test is not yet approved or cleared by the [place] FDA and  has been authorized for detection and/or diagnosis of SARS-CoV-2 by FDA under an Emergency Use Authorization (EUA). This EUA will remain  in effect (meaning this test can be used) for the duration of the COVID-19 declaration under Se ction 564(b)(1) of the Act, 21 U.S.C. section 360bbb-3(b)(1), unless the authorization is terminated or revoked sooner.  Performed at [facility], [address] [place], [place] [address]   MRSA PCR Screening     Status: None   Collection Time: 08/25/19  3:42 PM   Specimen: Nasopharyngeal  Result Value Ref Range Status   MRSA by PCR NEGATIVE NEGATIVE Final    Comment:        The GeneXpert MRSA Assay (FDA approved for NASAL specimens only), is one component of a comprehensive MRSA colonization surveillance program. It is not intended to diagnose MRSA infection nor to guide or monitor treatment for MRSA infections. Performed at [facility], [address] [place], [place] [address]      Studies: CT ABDOMEN PELVIS WO CONTRAST  Result Date: 08/26/2019 CLINICAL DATA:  Abdominal distension. EXAM: CT ABDOMEN AND PELVIS WITHOUT CONTRAST TECHNIQUE: Multidetector CT imaging of the abdomen and pelvis was performed following the standard protocol without IV contrast. COMPARISON:  None. FINDINGS: Lower chest: No acute abnormality. Hepatobiliary: No focal liver abnormality is seen. No gallstones, gallbladder wall thickening, or biliary dilatation. Pancreas: Unremarkable. No pancreatic ductal dilatation or  surrounding inflammatory changes. Spleen: Normal in size without focal abnormality. Adrenals/Urinary Tract: Adrenal glands and kidneys are unremarkable. No hydronephrosis or renal obstruction is noted. Foley catheter is noted within urinary bladder. Stomach/Bowel: Stomach is within normal limits. Appendix appears normal. No  evidence of bowel wall thickening, distention, or inflammatory changes. Vascular/Lymphatic: Atherosclerosis of abdominal aorta is noted without aneurysm formation. There does appear to be a mild to moderate amount of retroperitoneal hematoma in the right iliac and inguinal regions. Moderate size hematoma is noted in the soft tissues anterior to the musculature of the right hip. No definite intraperitoneal hemorrhage is noted. Reproductive: Status post hysterectomy. No adnexal masses. Other: Moderate size fat containing periumbilical hernia is noted. No ascites is noted. Musculoskeletal: No acute or significant osseous findings. IMPRESSION: 1. Mild to moderate amount of retroperitoneal hematoma is noted in the right iliac and inguinal regions. Moderate size hematoma is noted in the soft tissues anterior to the musculature of the right hip. No definite intraperitoneal hemorrhage is noted. These results will be called to the ordering clinician or representative by the Radiologist Assistant, and communication documented in the PACS or zVision Dashboard. 2. Moderate size fat containing periumbilical hernia. Aortic Atherosclerosis (ICD10-I70.0). Electronically Signed   By: Lupita Raider M.D.   On: 08/26/2019 16:31   CT HEAD WO CONTRAST  Result Date: 08/27/2019 CLINICAL DATA:  Evaluate for source of hemorrhage EXAM: CT HEAD WITHOUT CONTRAST TECHNIQUE: Contiguous axial images were obtained from the base of the skull through the vertex without intravenous contrast. COMPARISON:  Brain MRI from yesterday FINDINGS: Brain: Small infarcts by brain MRI are largely occult on this study. Remote perforator  infarct at the right basal ganglia and lateral left cerebellum. Mild cerebral volume loss. No hemorrhage, hydrocephalus, or masslike finding. Vascular: No hyperdense vessel or unexpected calcification. Skull: Normal. Negative for fracture or focal lesion. Sinuses/Orbits: Chronic left maxillary sinusitis with complete opacification. Patchy mucosal thickening elsewhere. IMPRESSION: 1. Known acute infarcts that are underestimated by CT. No detected progression or acute hemorrhage. 2. 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 Electronically Signed   By: Marnee Spring M.D.   On: 08/27/2019 06:40   MR ANGIO HEAD WO CONTRAST  Result Date: 08/26/2019 CLINICAL DATA:  Right ICA angioplasty with left-sided weakness EXAM: MRI HEAD WITHOUT CONTRAST MRA HEAD WITHOUT CONTRAST TECHNIQUE: Multiplanar, multiecho pulse sequences of the brain and surrounding structures were obtained without intravenous contrast. Angiographic images of the head were obtained using MRA technique without contrast. COMPARISON:  10/09/2018 FINDINGS: MRI HEAD FINDINGS Brain: Small (subcentimeter) scattered cortical and white matter infarcts along the right cerebral convexity and in the high left parietal cortex. On the right these are in a roughly watershed distribution. Small acute infarct in the upper right cerebellum. Small remote left cerebellar infarct Remote perforator infarct at the right basal ganglia. Mild chronic small vessel ischemic change in the cerebral white matter. No hydrocephalus or collection. Vascular: See below Skull and upper cervical spine: Normal marrow signal Sinuses/Orbits: Chronic left maxillary sinusitis with complete opacification also seen in 2020. MRA HEAD FINDINGS The right ICA is smaller than the left. Only mild narrowing at the right cervical petrous segment, an improvement. Severe right MCA stenosis with patent downstream vasculature. No left-sided or anterior cerebral stenosis or branch occlusion.  High-grade narrowing at the non dominant right V4 segment. Moderate mid basilar stenosis. Patent bilateral PCA. IMPRESSION: 1. Scattered small acute infarcts along the right MCA/watershed territory. Solitary small acute infarct in the left parietal cortex and right cerebellum. 2. Improved right ICA patency at the petrous segment. 3. Known severe right M1 segment stenosis. 4. 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 Electronically Signed   By: Marnee Spring M.D.   On: 08/26/2019 11:05  MR BRAIN WO CONTRAST  Result Date: 08/26/2019 CLINICAL DATA:  Right ICA angioplasty with left-sided weakness EXAM: MRI HEAD WITHOUT CONTRAST MRA HEAD WITHOUT CONTRAST TECHNIQUE: Multiplanar, multiecho pulse sequences of the brain and surrounding structures were obtained without intravenous contrast. Angiographic images of the head were obtained using MRA technique without contrast. COMPARISON:  10/09/2018 FINDINGS: MRI HEAD FINDINGS Brain: Small (subcentimeter) scattered cortical and white matter infarcts along the right cerebral convexity and in the high left parietal cortex. On the right these are in a roughly watershed distribution. Small acute infarct in the upper right cerebellum. Small remote left cerebellar infarct Remote perforator infarct at the right basal ganglia. Mild chronic small vessel ischemic change in the cerebral white matter. No hydrocephalus or collection. Vascular: See below Skull and upper cervical spine: Normal marrow signal Sinuses/Orbits: Chronic left maxillary sinusitis with complete opacification also seen in 2020. MRA HEAD FINDINGS The right ICA is smaller than the left. Only mild narrowing at the right cervical petrous segment, an improvement. Severe right MCA stenosis with patent downstream vasculature. No left-sided or anterior cerebral stenosis or branch occlusion. High-grade narrowing at the non dominant right V4 segment. Moderate mid basilar stenosis. Patent bilateral PCA.  IMPRESSION: 1. Scattered small acute infarcts along the right MCA/watershed territory. Solitary small acute infarct in the left parietal cortex and right cerebellum. 2. Improved right ICA patency at the petrous segment. 3. Known severe right M1 segment stenosis. 4. 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 Electronically Signed   By: Marnee Spring M.D.   On: 08/26/2019 11:05   DG CHEST PORT 1 VIEW  Result Date: 08/28/2019 CLINICAL DATA:  Hypoxia EXAM: PORTABLE CHEST 1 VIEW COMPARISON:  Yesterday FINDINGS: Increased generalized interstitial opacity. There is also more dense hazy opacity over the upper lungs, usually scarring in this location but not seen in 2020. Right PICC with tip at the SVC. Feeding tube which reaches the stomach IMPRESSION: Increased interstitial opacity, question edema. There also appears to be airspace disease in the upper lobes, infection not excluded. Electronically Signed   By: Marnee Spring M.D.   On: 08/28/2019 04:13   DG CHEST PORT 1 VIEW  Result Date: 08/27/2019 CLINICAL DATA:  Wheezing EXAM: PORTABLE CHEST 1 VIEW COMPARISON:  11/29/2018 FINDINGS: Esophageal tube tip is below the diaphragm but incompletely visualized. Right upper extremity central venous catheter tip over the SVC. Mild diffuse interstitial opacity with subtle nodularity. Stable cardiomediastinal silhouette with aortic atherosclerosis. No pneumothorax. IMPRESSION: Mild diffuse increased interstitial opacity with subtle nodularity, potentially due to atypical infection. Electronically Signed   By: Jasmine Pang M.D.   On: 08/27/2019 19:20   ECHOCARDIOGRAM COMPLETE  Result Date: 08/27/2019    ECHOCARDIOGRAM REPORT   Patient Name:   ODELLE KOSIER Dehart Date of Exam: 08/27/2019 Medical Rec #:  [id number]       Height:       61.0 in Accession #:    [id number]      Weight:       151.0 lb Date of Birth:  1950/10/08       BSA:          1.676 m Patient Age:    69 years        BP:           117/53 mmHg Patient  Gender: F               HR:           106 bpm. Exam  Location:  Inpatient Procedure: 2D Echo Indications:    Stroke  History:        Patient has prior history of Echocardiogram examinations, most                 recent 10/08/2018. Risk Factors:Hypertension, Dyslipidemia and                 Diabetes.  Sonographer:    Thurman Coyer RDCS (AE) Referring Phys: [id number] Marvel Plan IMPRESSIONS  1. Left ventricular ejection fraction, by estimation, is 60 to 65%. The left ventricle has normal function. The left ventricle has no regional wall motion abnormalities. There is moderate left ventricular hypertrophy of the basal-septal segment. Left ventricular diastolic function could not be evaluated.  2. Right ventricular systolic function is normal. The right ventricular size is normal. Tricuspid regurgitation signal is inadequate for assessing PA pressure.  3. The mitral valve is normal in structure. Trivial mitral valve regurgitation. No evidence of mitral stenosis.  4. The aortic valve was not well visualized. Aortic valve regurgitation is not visualized. No aortic stenosis is present.  5. The inferior vena cava is normal in size with greater than 50% respiratory variability, suggesting right atrial pressure of 3 mmHg. FINDINGS  Left Ventricle: Left ventricular ejection fraction, by estimation, is 60 to 65%. The left ventricle has normal function. The left ventricle has no regional wall motion abnormalities. The left ventricular internal cavity size was normal in size. There is  moderate left ventricular hypertrophy of the basal-septal segment. Left ventricular diastolic function could not be evaluated. Right Ventricle: The right ventricular size is normal. No increase in right ventricular wall thickness. Right ventricular systolic function is normal. Tricuspid regurgitation signal is inadequate for assessing PA pressure. Left Atrium: Left atrial size was normal in size. Right Atrium: Right atrial size was normal in size.  Pericardium: There is no evidence of pericardial effusion. Mitral Valve: The mitral valve is normal in structure. Normal mobility of the mitral valve leaflets. Severe mitral annular calcification. Trivial mitral valve regurgitation. No evidence of mitral valve stenosis. MV peak gradient, 18.5 mmHg. The mean mitral valve gradient is 9.0 mmHg. Tricuspid Valve: The tricuspid valve is normal in structure. Tricuspid valve regurgitation is not demonstrated. No evidence of tricuspid stenosis. Aortic Valve: The aortic valve was not well visualized. . There is moderate thickening and moderate calcification of the aortic valve. Aortic valve regurgitation is not visualized. No aortic stenosis is present. There is moderate thickening of the aortic  valve. There is moderate calcification of the aortic valve. Pulmonic Valve: The pulmonic valve was normal in structure. Pulmonic valve regurgitation is not visualized. No evidence of pulmonic stenosis. Aorta: The aortic root is normal in size and structure. Venous: The inferior vena cava is normal in size with greater than 50% respiratory variability, suggesting right atrial pressure of 3 mmHg. IAS/Shunts: No atrial level shunt detected by color flow Doppler.  LEFT VENTRICLE PLAX 2D LVIDd:         4.20 cm LVIDs:         2.90 cm LV PW:         1.10 cm LV IVS:        1.30 cm LVOT diam:     2.00 cm LV SV:         45 LV SV Index:   27 LVOT Area:     3.14 cm  RIGHT VENTRICLE RV S prime:     10.90 cm/s LEFT ATRIUM  Index       RIGHT ATRIUM          Index LA diam:        3.00 cm 1.79 cm/m  RA Area:     5.38 cm LA Vol (A2C):   37.0 ml 22.07 ml/m RA Volume:   7.65 ml  4.56 ml/m LA Vol (A4C):   23.0 ml 13.72 ml/m LA Biplane Vol: 31.4 ml 18.73 ml/m  AORTIC VALVE LVOT Vmax:   90.60 cm/s LVOT Vmean:  63.800 cm/s LVOT VTI:    0.142 m  AORTA Ao Root diam: 2.80 cm MITRAL VALVE MV Area (PHT): 4.49 cm     SHUNTS MV Peak grad:  18.5 mmHg    Systemic VTI:  0.14 m MV Mean grad:  9.0  mmHg     Systemic Diam: 2.00 cm MV Vmax:       2.15 m/s MV Vmean:      142.0 cm/s MV Decel Time: 169 msec MV E velocity: 155.00 cm/s MV A velocity: 172.00 cm/s MV E/A ratio:  0.90 Armanda Magic MD Electronically signed by Armanda Magic MD Signature Date/Time: 08/27/2019/12:03:05 PM    Final    [place] EKG SITE RITE  Result Date: 08/27/2019 If Site Rite image not attached, placement could not be confirmed due to current cardiac rhythm.  CT Angio Abd/Pel w/ and/or w/o  Result Date: 08/27/2019 CLINICAL DATA:  Acute blood loss anemia.  Recent arteriogram. EXAM: CTA ABDOMEN AND PELVIS WITHOUT AND WITH CONTRAST TECHNIQUE: Multidetector CT imaging of the abdomen and pelvis was performed using the standard protocol during bolus administration of intravenous contrast. Multiplanar reconstructed images and MIPs were obtained and reviewed to evaluate the vascular anatomy. CONTRAST:  OMNIPAQUE IOHEXOL 350 MG/ML SOLN COMPARISON:  Yesterday FINDINGS: VASCULAR Aorta: Diffuse atheromatous plaque.  Negative for aneurysm Celiac: Plaque at the origin moderate narrowing. No branch occlusion. SMA: Diffusely patent without stenosis or dissection Renals: Accessory left upper pole renal artery. There is atherosclerotic plaque without proximal high-grade stenosis. IMA: Patent Inflow: Atheromatous plaque. Proximal Outflow: No visible irregularity of common femoral or visible superficial femoral arteries. Veins: Hemorrhage tracks along the external iliac vein extends into the pelvis, overall small volume for the procedure. Review of the MIP images confirms the above findings. NON-VASCULAR Lower chest: Partially covered opacity at the right lower lobe with trace left pleural effusion. The airways appear cuffed and there are a few thickened interlobular septae a. Hepatobiliary: No focal liver abnormality.Contrast excretion into the gallbladder. Pancreas: Unremarkable. Spleen: Unremarkable. Adrenals/Urinary Tract: Negative adrenals. No  hydronephrosis or stone. Unremarkable bladder. Stomach/Bowel:  No obstruction. No appendicitis. Lymphatic: No mass or adenopathy.  Fatty umbilical hernia. Reproductive:No pathologic findings. Other: No ascites or pneumoperitoneum. Musculoskeletal: No acute abnormalities. Lower lumbar disc bulging and facet spurring. Case discussed with Dr. Edilia Bo, fortunately the thigh remain soft. IMPRESSION: 1. No visualized active bleeding. There is unchanged and relatively mild blood products in the right groin and retroperitoneum. There was right rectus femoris expansion and hemorrhage on the preceding abdominal CT, less well covered today. 2. Mild pulmonary edema. Electronically Signed   By: Marnee Spring M.D.   On: 08/27/2019 07:02      Joycelyn Das, MD  [facility] 08/28/2019

## 2019-08-28 NOTE — Progress Notes (Signed)
   VASCULAR SURGERY ASSESSMENT & PLAN:   RIGHT GROIN HEMATOMA: The swelling in her right thigh is unchanged.  Her abdomen is soft.  Based on her CT scan yesterday there is no evidence of active bleeding.  The patient has refused blood.  Nothing further to add from a vascular standpoint.  Vascular surgery will be available as needed.  SUBJECTIVE:   Lethargic  PHYSICAL EXAM:   Vitals:   08/28/19 0400 08/28/19 0500 08/28/19 0600 08/28/19 0800  BP: (!) 181/77 (!) 141/66 (!) 127/52   Pulse: (!) 110 100 80   Resp: (!) 23 (!) 28 (!) 22   Temp: 99.2 F (37.3 C)   99.7 F (37.6 C)  TempSrc: Axillary   Axillary  SpO2: 95% 93% 99%   Weight:      Height:       No change in swelling in the right thigh.  No hematoma at the cannulation site.  Abdomen is soft and nontender.  LABS:   Lab Results  Component Value Date   WBC 13.1 (H) 08/28/2019   HGB 11.9 (L) 08/28/2019   HCT 35.0 (L) 08/28/2019   MCV 93.2 08/28/2019   PLT 177 08/28/2019   Lab Results  Component Value Date   CREATININE 0.96 08/28/2019   Lab Results  Component Value Date   INR 1.2 08/27/2019   CBG (last 3)  Recent Labs    08/27/19 2314 08/28/19 0305 08/28/19 0731  GLUCAP 146* 224* 256*    PROBLEM LIST:    Principal Problem:   Internal carotid artery stenosis, right Active Problems:   Essential hypertension   Hyperlipidemia   Cerebral infarction (HCC)   Type 2 diabetes mellitus with vascular disease (HCC)   AKI (acute kidney injury) (HCC)   Anemia   CURRENT MEDS:   . Chlorhexidine Gluconate Cloth  6 each Topical Daily  . darbepoetin (ARANESP) injection - NON-DIALYSIS  40 mcg Subcutaneous Q Wed-1800  . docusate sodium  100 mg Oral BID  . feeding supplement (PRO-STAT SUGAR FREE 64)  30 mL Per Tube BID  . insulin aspart  0-15 Units Subcutaneous Q4H  . insulin aspart  3 Units Subcutaneous Q4H  . insulin glargine  10 Units Subcutaneous Daily  . sodium chloride flush  10-40 mL Intracatheter Q12H     Waverly Ferrari Office: [phone] 08/28/2019

## 2019-08-28 NOTE — Progress Notes (Addendum)
eLink Physician-Brief Progress Note Patient Name: TANEKA ESPIRITU DOB: 09/30/1950 MRN: [id number]   Date of Service  08/28/2019  HPI/Events of Note  Hypoxia / Increased O2 requirement - 2 L/min [place] --> 6 L/min [place]. Nursing reports wheezing. Nursing also reports BP = 172/60 and agitation.   eICU Interventions  Plan: 1. Albuterol 2.5 mg via Neb Q 4 hours PRN wheezing or SOB.  2. Portable CXR STAT.  3. ABG STAT. 4. Will ask ground team to evaluate at bedside.      Intervention Category Major Interventions: Hypoxemia - evaluation and management  Makalah Asberry Eugene 08/28/2019, 3:51 AM

## 2019-08-28 NOTE — Progress Notes (Signed)
PT Cancellation Note  Patient Details Name: Donna [name] MRN: [id number] DOB: 1951-01-29   Cancelled Treatment:    Reason Eval/Treat Not Completed: Medical issues which prohibited therapy. Per RN pt not appropriate for therapy today. PT will continue to f/u with pt acutely as available and appropriate.    Alessandra Bevels Molleigh Huot 08/28/2019, 9:31 AM

## 2019-08-28 NOTE — Progress Notes (Signed)
Initial Nutrition Assessment  DOCUMENTATION CODES:   Not applicable  INTERVENTION:   Tube feeding via cortrak tube: Jevity 1.2 at 50 ml/h (1200 ml per day) Pro-stat 30 ml BID  Provides 1640 kcal, 96 gm protein, 973 ml free water daily   NUTRITION DIAGNOSIS:   Inadequate oral intake related to inability to eat as evidenced by NPO status.  GOAL:   Patient will meet greater than or equal to 90% of their needs  MONITOR:   TF tolerance, Labs  REASON FOR ASSESSMENT:   Consult Enteral/tube feeding initiation and management  ASSESSMENT:   Pt who is a Jehovah witness with PMH of HTN, HLD, R MCA CVA 06/2018, DM, anemia, obesity, OSA admitted 6/14 for cerebral angiogram s/p R ICA angioplasty for severe stenosis.    6/14 s/p balloon angioplasty for severe stenosis; pt developed lethargy and increased L sided weakness found to have new watershed infarcts; started on vasopressors for cerebral perfusion 6/16 cortrak placed; tip in stomach  6/17 pt with increased agitation; requiring increased O2    Medications reviewed and include: colace, 3 units novolog every 4 hours, 10 units lantus daily  Precedex  Labs reviewed: 224-256 Lab Results  Component Value Date   HGBA1C 11.2 (H) 05/22/2019     UOP: 3950 ml   TF: Jevity 1.2 @ 40 ml/hr with 30 ml Prostat BID Provides: 1352 kcal and 83 grams protein  NUTRITION - FOCUSED PHYSICAL EXAM:    Most Recent Value  Orbital Region No depletion  Upper Arm Region No depletion  Thoracic and Lumbar Region No depletion  Buccal Region No depletion  Temple Region No depletion  Clavicle Bone Region No depletion  Clavicle and Acromion Bone Region No depletion  Scapular Bone Region No depletion  Dorsal Hand No depletion  Patellar Region No depletion  Anterior Thigh Region No depletion  Posterior Calf Region No depletion  Edema (RD Assessment) None  Hair Reviewed  Eyes Unable to assess  Mouth Unable to assess  Skin Reviewed  Nails  Reviewed       Diet Order:   Diet Order            Diet NPO time specified  Diet effective now                 EDUCATION NEEDS:   Not appropriate for education at this time  Skin:  Skin Assessment: Reviewed RN Assessment  Last BM:  6/16  Height:   Ht Readings from Last 1 Encounters:  08/25/19 5\' 1"  (1.549 m)    Weight:   Wt Readings from Last 1 Encounters:  08/25/19 68.5 kg    Ideal Body Weight:  47.7 kg  BMI:  Body mass index is 28.53 kg/m.  Estimated Nutritional Needs:   Kcal:  1550-1750  Protein:  85-100 grams  Fluid:  >1.5 L/day   08/27/19., RD, LDN, CNSC See AMiON for contact information

## 2019-08-29 ENCOUNTER — Telehealth: Payer: Medicare HMO

## 2019-08-29 ENCOUNTER — Inpatient Hospital Stay (HOSPITAL_COMMUNITY): Payer: Medicare HMO

## 2019-08-29 ENCOUNTER — Ambulatory Visit (INDEPENDENT_AMBULATORY_CARE_PROVIDER_SITE_OTHER): Payer: Medicare HMO

## 2019-08-29 DIAGNOSIS — I10 Essential (primary) hypertension: Secondary | ICD-10-CM | POA: Diagnosis not present

## 2019-08-29 DIAGNOSIS — I771 Stricture of artery: Secondary | ICD-10-CM

## 2019-08-29 DIAGNOSIS — I6350 Cerebral infarction due to unspecified occlusion or stenosis of unspecified cerebral artery: Secondary | ICD-10-CM | POA: Diagnosis not present

## 2019-08-29 DIAGNOSIS — Z794 Long term (current) use of insulin: Secondary | ICD-10-CM | POA: Diagnosis not present

## 2019-08-29 DIAGNOSIS — E11319 Type 2 diabetes mellitus with unspecified diabetic retinopathy without macular edema: Secondary | ICD-10-CM

## 2019-08-29 DIAGNOSIS — E785 Hyperlipidemia, unspecified: Secondary | ICD-10-CM | POA: Diagnosis not present

## 2019-08-29 DIAGNOSIS — E876 Hypokalemia: Secondary | ICD-10-CM

## 2019-08-29 DIAGNOSIS — I639 Cerebral infarction, unspecified: Secondary | ICD-10-CM

## 2019-08-29 DIAGNOSIS — E7800 Pure hypercholesterolemia, unspecified: Secondary | ICD-10-CM

## 2019-08-29 LAB — CBC
HCT: 15.6 % — ABNORMAL LOW (ref 36.0–46.0)
Hemoglobin: 4.7 g/dL — CL (ref 12.0–15.0)
MCH: 29.6 pg (ref 26.0–34.0)
MCHC: 30.1 g/dL (ref 30.0–36.0)
MCV: 98.1 fL (ref 80.0–100.0)
Platelets: 177 10*3/uL (ref 150–400)
RBC: 1.59 MIL/uL — ABNORMAL LOW (ref 3.87–5.11)
RDW: 14.3 % (ref 11.5–15.5)
WBC: 10.2 10*3/uL (ref 4.0–10.5)
nRBC: 2.3 % — ABNORMAL HIGH (ref 0.0–0.2)

## 2019-08-29 LAB — COMPREHENSIVE METABOLIC PANEL
ALT: 21 U/L (ref 0–44)
AST: 22 U/L (ref 15–41)
Albumin: 2.4 g/dL — ABNORMAL LOW (ref 3.5–5.0)
Alkaline Phosphatase: 42 U/L (ref 38–126)
Anion gap: 8 (ref 5–15)
BUN: 14 mg/dL (ref 8–23)
CO2: 27 mmol/L (ref 22–32)
Calcium: 8.2 mg/dL — ABNORMAL LOW (ref 8.9–10.3)
Chloride: 112 mmol/L — ABNORMAL HIGH (ref 98–111)
Creatinine, Ser: 0.87 mg/dL (ref 0.44–1.00)
GFR calc Af Amer: >60 mL/min (ref >60)
GFR calc non Af Amer: >60 mL/min (ref >60)
Glucose, Bld: 177 mg/dL — ABNORMAL HIGH (ref 70–99)
Potassium: 2.8 mmol/L — ABNORMAL LOW (ref 3.5–5.1)
Sodium: 147 mmol/L — ABNORMAL HIGH (ref 135–145)
Total Bilirubin: 0.8 mg/dL (ref 0.3–1.2)
Total Protein: 5.5 g/dL — ABNORMAL LOW (ref 6.5–8.1)

## 2019-08-29 LAB — GLUCOSE, CAPILLARY
Glucose-Capillary: 186 mg/dL — ABNORMAL HIGH (ref 70–99)
Glucose-Capillary: 133 mg/dL — ABNORMAL HIGH (ref 70–99)
Glucose-Capillary: 114 mg/dL — ABNORMAL HIGH (ref 70–99)
Glucose-Capillary: 307 mg/dL — ABNORMAL HIGH (ref 70–99)
Glucose-Capillary: 281 mg/dL — ABNORMAL HIGH (ref 70–99)
Glucose-Capillary: 192 mg/dL — ABNORMAL HIGH (ref 70–99)

## 2019-08-29 LAB — MAGNESIUM: Magnesium: 2 mg/dL (ref 1.7–2.4)

## 2019-08-29 LAB — PHOSPHORUS: Phosphorus: 1.7 mg/dL — ABNORMAL LOW (ref 2.5–4.6)

## 2019-08-29 MED ORDER — POTASSIUM PHOSPHATES 15 MMOLE/5ML IV SOLN
30.0000 mmol | Freq: Once | INTRAVENOUS | Status: AC
  Administered 2019-08-29: 30 mmol via INTRAVENOUS
  Filled 2019-08-29: qty 10

## 2019-08-29 MED ORDER — ORAL CARE MOUTH RINSE
15.0000 mL | Freq: Two times a day (BID) | OROMUCOSAL | Status: DC
  Administered 2019-08-29 – 2019-08-30 (×3): 15 mL via OROMUCOSAL

## 2019-08-29 MED ORDER — POTASSIUM CHLORIDE 20 MEQ/15ML (10%) PO SOLN
40.0000 meq | Freq: Three times a day (TID) | ORAL | Status: AC
  Administered 2019-08-29 (×3): 40 meq
  Filled 2019-08-29 (×3): qty 30

## 2019-08-29 MED ORDER — CHLORHEXIDINE GLUCONATE 0.12 % MT SOLN
15.0000 mL | Freq: Two times a day (BID) | OROMUCOSAL | Status: DC
  Administered 2019-08-29 – 2019-09-03 (×11): 15 mL via OROMUCOSAL
  Filled 2019-08-29 (×4): qty 15

## 2019-08-29 MED ORDER — K PHOS MONO-SOD PHOS DI & MONO 155-852-130 MG PO TABS
500.0000 mg | ORAL_TABLET | Freq: Three times a day (TID) | ORAL | Status: DC
  Administered 2019-08-29: 500 mg
  Filled 2019-08-29: qty 2

## 2019-08-29 MED ORDER — IPRATROPIUM-ALBUTEROL 0.5-2.5 (3) MG/3ML IN SOLN
3.0000 mL | RESPIRATORY_TRACT | Status: AC
  Administered 2019-08-29: 3 mL via RESPIRATORY_TRACT
  Filled 2019-08-29: qty 3

## 2019-08-29 MED ORDER — DARBEPOETIN ALFA 60 MCG/0.3ML IJ SOSY
60.0000 ug | PREFILLED_SYRINGE | Freq: Once | INTRAMUSCULAR | Status: AC
  Administered 2019-08-29: 60 ug via SUBCUTANEOUS
  Filled 2019-08-29: qty 0.3

## 2019-08-29 MED ORDER — POTASSIUM CHLORIDE 20 MEQ/15ML (10%) PO SOLN: 40.0000 meq | Freq: Once | ORAL | Status: DC

## 2019-08-29 MED ORDER — METHYLPREDNISOLONE SODIUM SUCC 125 MG IJ SOLR
60.0000 mg | Freq: Once | INTRAMUSCULAR | Status: AC
  Administered 2019-08-29: 60 mg via INTRAVENOUS
  Filled 2019-08-29: qty 2

## 2019-08-29 MED ORDER — FUROSEMIDE 10 MG/ML IJ SOLN
20.0000 mg | Freq: Once | INTRAMUSCULAR | Status: AC
  Administered 2019-08-29: 20 mg via INTRAVENOUS
  Filled 2019-08-29: qty 2

## 2019-08-29 MED ORDER — RACEPINEPHRINE HCL 2.25 % IN NEBU
0.5000 mL | INHALATION_SOLUTION | Freq: Once | RESPIRATORY_TRACT | Status: AC
  Administered 2019-08-29: 0.5 mL via RESPIRATORY_TRACT
  Filled 2019-08-29: qty 0.5

## 2019-08-29 MED ORDER — DOCUSATE SODIUM 50 MG/5ML PO LIQD
100.0000 mg | Freq: Two times a day (BID) | ORAL | Status: DC
  Administered 2019-08-29 – 2019-09-19 (×28): 100 mg
  Filled 2019-08-29 (×30): qty 10

## 2019-08-29 MED ORDER — DARBEPOETIN ALFA 100 MCG/0.5ML IJ SOSY
100.0000 ug | PREFILLED_SYRINGE | INTRAMUSCULAR | Status: DC
  Administered 2019-09-03: 100 ug via SUBCUTANEOUS
  Filled 2019-08-29: qty 0.5

## 2019-08-29 MED ORDER — K PHOS MONO-SOD PHOS DI & MONO 155-852-130 MG PO TABS
500.0000 mg | ORAL_TABLET | Freq: Three times a day (TID) | ORAL | Status: DC
  Filled 2019-08-29 (×2): qty 2

## 2019-08-29 MED ORDER — POTASSIUM CHLORIDE 10 MEQ/50ML IV SOLN
10.0000 meq | INTRAVENOUS | Status: AC
  Administered 2019-08-29 (×4): 10 meq via INTRAVENOUS
  Filled 2019-08-29 (×5): qty 50

## 2019-08-29 NOTE — Progress Notes (Signed)
NAME:  Donna [name], MRN:  [id number], DOB:  [birth year], LOS: 6 ADMISSION DATE:  08/25/2019, CONSULTATION DATE:  08/27/2019 REFERRING MD:  Dr. 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  Was placed on ASA/ brillinta.  Monitored in Neuro ICU.  Noted to have developed hypotension, increased left sided weakness and lethargy on 6/15 am with Hgb drop from 13.3 to 6.9 with new AKI.  Post MRI/ MRA showed scattered small acute infarcts along the right MCA/ watershed territory suspected to be resultant either post procedure vs hypoperfusion.  Neurology was consulted with recommendations to keep SBP > 140 for cerebral perfusion.  A CT abd/ pelvis was obtained given concern for retroperitoneal bleed which showed a mild to moderate amount of retroperitoneal hematoma in the right iliac and inguinal regiones with moderate size hematoma noted the the soft tissues anterior to the musculature of the right hip; no definite intrapertioneal hemorrhage. INR, PT, and platelets rechecked and were wnl.  Vascular surgery was consulted with plans to observe given bleeding was mostly into the muscle and tissue.  She was started on neosynephrine to keep SBP goal > 140-160 for cerebral perfusion.  PCCM consulted to help with vasopressor support.  S/p 1L bolus  Repeat Hgb now 6.9-> 7-> 5.1.   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     Significant Diagnostic Tests:   6/15 MRA/ MRI brain  >> 1. Scattered small acute infarcts along the right MCA/watershed territory. Solitary small acute infarct in the left parietal cortex and right cerebellum. 2. Improved right ICA patency at the petrous segment. 3. Known severe right M1 segment stenosis. 4. 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  6/15 CT a/p wo contrast >> 1. Mild to moderate amount of retroperitoneal hematoma is noted in the right iliac and inguinal regions. Moderate size hematoma is noted in the soft tissues anterior to the musculature of the right hip. No definite intraperitoneal hemorrhage is noted. These results will be called to the ordering clinician or representative by the Radiologist Assistant, and communication documented in the PACS or zVision Dashboard. 2. Moderate size fat containing periumbilical hernia. Aortic Atherosclerosis   CXR 6/17> increased interstitial opacities, possibly edema. BUL ASD. R PICC with tip in SVC, NGT reaches stomach   Micro Data:  6/10 SARS 2 >> neg 6/14 MRSA PCR >> neg  Antimicrobials:  6/14 cefazolin pre-op   Interim history/subjective:    08/29/2019 - > more awake and more purposeful but also has more respiratory distress.  Did pull out the core track today.  Objective   Blood pressure (!) 143/61, pulse 96, temperature 98.8 F (37.1 C), temperature source Axillary, resp. rate (!) 26, height 5\' 1"  (1.549 m), weight 68.5 kg, SpO2 91 %. CVP:  [  20 mmHg-56 mmHg] 23 mmHg  FiO2 (%):  [36 %] 36 %   Intake/Output Summary (Last 24 hours) at 08/29/2019 0841 Last data filed at 08/29/2019 0600 Gross per 24 hour  Intake 970.72 ml  Output 1650 ml  Net -679.28 ml    Filed Weights   08/25/19 0705  Weight: 68.5 kg    Examination:  Morbidly obese female.  More awake.  Able to move her right side purposefully.  Able to move the left upper extremity somewhat purposefully but left lower extremity is flaccid.  She seems to have some upper airway wheeze today.  Slightly paradoxical respiratory rate in the mid 20s.  Visceral obesity present.  No cyanosis no clubbing no edema  Resolved Hospital Problem list   Hypotension   Assessment & Plan:   Acute encephalopathy - -in setting of CNS depressing medications, R MCA CVA  08/29/2019 - improving but still did pull out core track.  P -minimize CNS depressing medications as able -supportive care, CVA support per neurology   s/p RT ICA cer/petrous junction balloon angioplasty for severe stenosis. Post-procedure with residual stenosis of approx 25 %. Severe stenosis of RT MCA M1 seg with improved flow post angioplasty Right MCA watershed territory infarct Small left frontal cortex and right cerebellum    P:  - Per Neuro IR and Neurology - Pending further stroke workup - TTE, lipid panel, A1c   Acute respiratory failure with hypoxia - -CXR with pulm edema, mild BUL opacities    -08/29/2019: Protecting airway but in moderate respiratory distress with mild to moderate paradoxus.  Possible upper airway wheeze.  Nursing does not think she will be able to tolerate BiPAP.  Critically low phosphorus   P -Replete K-Phos -Racemic epinephrine x1 -IV Solu-Medrol 60 mg x 1 -Lasix 20 mg IV x1 -DuoNeb x1 -Intubate if she gets worse and deteriorates -Monitor airway protection, intubation risk  -Supplemental O2 as needed for SpO2 goal > 92% -Duonebs -Continue diuresis -Pulm hygiene    Acute blood loss anemia - Jehovah's Witness: no blood products including albumin  - CTA Abd/Pelvis 08/26/19 with hematoma of the right groin and retroperitoneum with expansion to the right rectus femoris expansion. No active  bleeding  P -Recommend against trending H/H -- will not change management -limit cbc to  QOD  -Hold ASA/Brillinta -aranesp half dosage weekly (first dose August 27, 2019] -  feraheme every 72 hours [first dose August 27, 2019] x2 doses -Appreciate VVS following for ongoing evaluation of R fem -- no evidence of further bleeding   AKI, August 26, 2019 - - etiology: Pre-renal in setting of anemia ( baseline sCr 0.93)  August 29, 2019: Resolved as of August 28, 2019  P:  - Continue supportive care - trend renal indices, I/O    DM, type II - uncontrolled P:  - SSI + Lantus + EN coverage   Electrolyte imbalance  08/29/2019: Severely low phosphorus and potassium P: -Replete potassium and phosphorus  Best practice:  Diet: NPO  Pain/Anxiety/Delirium protocol (if indicated): n/a VAP protocol (if indicated): n/a DVT prophylaxis: SCDs only in setting of acute anemia/blood loss GI prophylaxis: Pepcid Glucose control: CBG q 4 Mobility: BR Code Status: Full   Family Communication: - son, Evaristo Bury at [phone] -> updated of above and risk for intubation  Disposition: ICU   D/with Dr Earl Lagos at bedside   ATTESTATION & SIGNATURE   The patient ALEXIANNA NACHREINER is critically ill with multiple organ systems failure and requires high  complexity decision making for assessment and support, frequent evaluation and titration of therapies, application of advanced monitoring technologies and extensive interpretation of multiple databases.   Critical Care Time devoted to patient care services described in this note is  31  Minutes. This time reflects time of care of this signee Dr Kalman Shan. This critical care time does not reflect procedure time, or teaching time or supervisory time of PA/NP/Med student/Med Resident etc but could involve care discussion time     Dr. Kalman Shan, M.D., [facility].C.P Pulmonary and Critical Care Medicine Staff Physician [facility]  Pulmonary and Critical Care Pager: [phone], If no answer or between  15:00h - 7:00h: call [phone]  08/29/2019 8:41 AM    LABS    PULMONARY Recent Labs  Lab 08/27/19 0546 08/28/19 0505 08/28/19 1608  PHART 7.442 7.423 7.499*  PCO2ART 25.2* 34.4 34.1  PO2ART 64* 69* 97  HCO3 17.2* 22.4 26.5  TCO2 18* 23 28  O2SAT 94.0 94.0 98.0    CBC Recent Labs  Lab 08/27/19 0230 08/27/19 0529 08/28/19 0500 08/28/19 0500 08/28/19 0505 08/28/19 1608 08/29/19 0457  HGB 4.8*   < > 5.2*   < > 11.9* 6.8* 4.7*  HCT 15.1*   < > 16.5*   < > 35.0* 20.0* 15.6*  WBC 11.6*  --  13.1*  --   --   --  10.2  PLT 182  --  177  --   --   --  177   < > = values in this interval not displayed.    COAGULATION Recent Labs  Lab 08/26/19 1715 08/27/19 0230  INR 1.1 1.2    CARDIAC  No results for input(s): TROPONINI in the last 168 hours. No results for input(s): PROBNP in the last 168 hours.   CHEMISTRY Recent Labs  Lab 08/26/19 0555 08/26/19 0555 08/27/19 0230 08/27/19 0230 08/27/19 0546 08/27/19 0546 08/28/19 0500 08/28/19 0500 08/28/19 0505 08/28/19 0505 08/28/19 1608 08/29/19 0457  NA 134*   < > 141   < > 144  --  143  --  144  --  146* 147*  K 4.5   < > 3.5   < > 3.4*   < > 3.7   < > 3.5   < > 3.3* 2.8*  CL 103  --  114*  --   --   --  111  --   --   --   --  112*  CO2 17*  --  19*  --   --   --  22  --   --   --   --  27  GLUCOSE 289*  --  189*  --   --   --  316*  --   --   --   --  177*  BUN 31*  --  21  --   --   --  13  --   --   --   --  14  CREATININE 1.78*  --  1.12*  --   --   --  0.96  --   --   --   --  0.87  CALCIUM 8.0*  --  7.6*  --   --   --  8.3*  --   --   --   --  8.2*  MG  --   --   --   --   --   --  1.9  --   --   --   --  2.0  PHOS  --   --   --   --   --   --   --   --   --   --   --  1.7*   < > = values in this interval not displayed.   Estimated Creatinine Clearance: 54.8 mL/min (by C-G formula based on SCr of 0.87  mg/dL).   LIVER Recent Labs  Lab 08/26/19 1715 08/27/19 0230 08/28/19 0500 08/29/19 0457  AST  --  21 29 22   ALT  --  21 24 21   ALKPHOS  --  36* 46 42  BILITOT  --  0.1* 0.9 0.8  PROT  --  4.9* 5.7* 5.5*  ALBUMIN  --  2.4* 2.7* 2.4*  INR 1.1 1.2  --   --      INFECTIOUS No results for input(s): LATICACIDVEN, PROCALCITON in the last 168 hours.   ENDOCRINE CBG (last 3)  Recent Labs    08/28/19 2302 08/29/19 0314 08/29/19 0737  GLUCAP 155* 186* 133*         IMAGING x48h  - image(s) personally visualized  -   highlighted in bold DG CHEST PORT 1 VIEW  Result Date: 08/29/2019 CLINICAL DATA:  Pulmonary edema. EXAM: PORTABLE CHEST 1 VIEW COMPARISON:  08/28/2019.  08/27/2019.  11/29/2018. FINDINGS: Feeding tube and right PICC line stable position. Heart size normal. Stable mild interstitial prominence. No alveolar infiltrate. No pleural effusion or pneumothorax. No acute bony abnormality. IMPRESSION: 1.  Lines and tubes in stable position. 2. Stable mild bilateral interstitial prominence. Again mild interstitial edema and/or pneumonitis cannot be excluded. Electronically Signed   By: Maisie Fushomas  Register   On: 08/29/2019 08:21   DG CHEST PORT 1 VIEW  Result Date: 08/28/2019 CLINICAL DATA:  Hypoxia EXAM: PORTABLE CHEST 1 VIEW COMPARISON:  Yesterday FINDINGS: Increased generalized interstitial opacity. There is also more dense hazy opacity over the upper lungs, usually scarring in this location but not seen in 2020. Right PICC with tip at the SVC. Feeding tube which reaches the stomach IMPRESSION: Increased interstitial opacity, question edema. There also appears to be airspace disease in the upper lobes, infection not excluded. Electronically Signed   By: Marnee SpringJonathon  Watts M.D.   On: 08/28/2019 04:13   DG CHEST PORT 1 VIEW  Result Date: 08/27/2019 CLINICAL DATA:  Wheezing EXAM: PORTABLE CHEST 1 VIEW COMPARISON:  11/29/2018 FINDINGS: Esophageal tube tip is below the diaphragm but  incompletely visualized. Right upper extremity central venous catheter tip over the SVC. Mild diffuse interstitial opacity with subtle nodularity. Stable cardiomediastinal silhouette with aortic atherosclerosis. No pneumothorax. IMPRESSION: Mild diffuse increased interstitial opacity with subtle nodularity, potentially due to atypical infection. Electronically Signed   By: Jasmine PangKim  Fujinaga M.D.   On: 08/27/2019 19:20   ECHOCARDIOGRAM COMPLETE  Result Date: 08/27/2019    ECHOCARDIOGRAM REPORT   Patient Name:   Princella IonGARNETTE S Bollard Date of Exam: 08/27/2019 Medical Rec #:  [id number]       Height:       61.0 in Accession #:    [id number]      Weight:       151.0 lb Date of Birth:  06-03-1950       BSA:          1.676 m Patient Age:    69 years        BP:  117/53 mmHg Patient Gender: F               HR:           106 bpm. Exam Location:  Inpatient Procedure: 2D Echo Indications:    Stroke  History:        Patient has prior history of Echocardiogram examinations, most                 recent 10/08/2018. Risk Factors:Hypertension, Dyslipidemia and                 Diabetes.  Sonographer:    Thurman Coyer RDCS (AE) Referring Phys: [id number] Marvel Plan IMPRESSIONS  1. Left ventricular ejection fraction, by estimation, is 60 to 65%. The left ventricle has normal function. The left ventricle has no regional wall motion abnormalities. There is moderate left ventricular hypertrophy of the basal-septal segment. Left ventricular diastolic function could not be evaluated.  2. Right ventricular systolic function is normal. The right ventricular size is normal. Tricuspid regurgitation signal is inadequate for assessing PA pressure.  3. The mitral valve is normal in structure. Trivial mitral valve regurgitation. No evidence of mitral stenosis.  4. The aortic valve was not well visualized. Aortic valve regurgitation is not visualized. No aortic stenosis is present.  5. The inferior vena cava is normal in size with greater than 50%  respiratory variability, suggesting right atrial pressure of 3 mmHg. FINDINGS  Left Ventricle: Left ventricular ejection fraction, by estimation, is 60 to 65%. The left ventricle has normal function. The left ventricle has no regional wall motion abnormalities. The left ventricular internal cavity size was normal in size. There is  moderate left ventricular hypertrophy of the basal-septal segment. Left ventricular diastolic function could not be evaluated. Right Ventricle: The right ventricular size is normal. No increase in right ventricular wall thickness. Right ventricular systolic function is normal. Tricuspid regurgitation signal is inadequate for assessing PA pressure. Left Atrium: Left atrial size was normal in size. Right Atrium: Right atrial size was normal in size. Pericardium: There is no evidence of pericardial effusion. Mitral Valve: The mitral valve is normal in structure. Normal mobility of the mitral valve leaflets. Severe mitral annular calcification. Trivial mitral valve regurgitation. No evidence of mitral valve stenosis. MV peak gradient, 18.5 mmHg. The mean mitral valve gradient is 9.0 mmHg. Tricuspid Valve: The tricuspid valve is normal in structure. Tricuspid valve regurgitation is not demonstrated. No evidence of tricuspid stenosis. Aortic Valve: The aortic valve was not well visualized. . There is moderate thickening and moderate calcification of the aortic valve. Aortic valve regurgitation is not visualized. No aortic stenosis is present. There is moderate thickening of the aortic  valve. There is moderate calcification of the aortic valve. Pulmonic Valve: The pulmonic valve was normal in structure. Pulmonic valve regurgitation is not visualized. No evidence of pulmonic stenosis. Aorta: The aortic root is normal in size and structure. Venous: The inferior vena cava is normal in size with greater than 50% respiratory variability, suggesting right atrial pressure of 3 mmHg. IAS/Shunts: No  atrial level shunt detected by color flow Doppler.  LEFT VENTRICLE PLAX 2D LVIDd:         4.20 cm LVIDs:         2.90 cm LV PW:         1.10 cm LV IVS:        1.30 cm LVOT diam:     2.00 cm LV SV:  45 LV SV Index:   27 LVOT Area:     3.14 cm  RIGHT VENTRICLE RV S prime:     10.90 cm/s LEFT ATRIUM             Index       RIGHT ATRIUM          Index LA diam:        3.00 cm 1.79 cm/m  RA Area:     5.38 cm LA Vol (A2C):   37.0 ml 22.07 ml/m RA Volume:   7.65 ml  4.56 ml/m LA Vol (A4C):   23.0 ml 13.72 ml/m LA Biplane Vol: 31.4 ml 18.73 ml/m  AORTIC VALVE LVOT Vmax:   90.60 cm/s LVOT Vmean:  63.800 cm/s LVOT VTI:    0.142 m  AORTA Ao Root diam: 2.80 cm MITRAL VALVE MV Area (PHT): 4.49 cm     SHUNTS MV Peak grad:  18.5 mmHg    Systemic VTI:  0.14 m MV Mean grad:  9.0 mmHg     Systemic Diam: 2.00 cm MV Vmax:       2.15 m/s MV Vmean:      142.0 cm/s MV Decel Time: 169 msec MV E velocity: 155.00 cm/s MV A velocity: 172.00 cm/s MV E/A ratio:  0.90 Armanda Magic MD Electronically signed by Armanda Magic MD Signature Date/Time: 08/27/2019/12:03:05 PM    Final

## 2019-08-29 NOTE — Chronic Care Management (AMB) (Signed)
Chronic Care Management   Follow Up Note   08/29/2019 Name: Donna [name] MRN: [id number] DOB: 07-Feb-1951  Referred by: Arnette Felts, FNP Reason for referral : Chronic Care Management (FU RN CM - IP status)   Donna [name] is a 69 y.o. year old female who is a primary care patient of Arnette Felts, FNP. The CCM team was consulted for assistance with chronic disease management and care coordination needs.    Review of patient status, including review of consultants reports, relevant laboratory and other test results, and collaboration with appropriate care team members and the patient's provider was performed as part of comprehensive patient evaluation and provision of chronic care management services.    SDOH (Social Determinants of Health) assessments performed: No See Care Plan activities for detailed interventions related to [facility])   Reviewed chart for Inpatient admission status. Patient is still IP.  Noted the following update provided by Dr. Marvel Plan MD on 08/29/19;   Following the procedure for right carotid artery stenosis, post-procedure developed worsening L sided weakness and neglect. MRI with scattered R MCA watershed infarcts, L frontal cortex and R cerebellar infarcts. Patient is not ready for medical therapy at this time. The Stroke Team is monitoring. Patient's disposition is pending at this time.    Facility-Administered Encounter Medications as of 08/29/2019  Medication  . 0.9 %  sodium chloride infusion  . 0.9 %  sodium chloride infusion  . acetaminophen (TYLENOL) tablet 650 mg   Or  . acetaminophen (TYLENOL) 160 MG/5ML solution 650 mg   Or  . acetaminophen (TYLENOL) suppository 650 mg  . albuterol (PROVENTIL) (2.5 MG/3ML) 0.083% nebulizer solution 2.5 mg  . chlorhexidine (PERIDEX) 0.12 % solution 15 mL  . Chlorhexidine Gluconate Cloth 2 % PADS 6 each  . [START ON 09/03/2019] Darbepoetin Alfa (ARANESP) injection 100 mcg  . Darbepoetin Alfa (ARANESP) injection  60 mcg  . docusate sodium (COLACE) capsule 100 mg  . famotidine (PEPCID) IVPB 20 mg premix  . feeding supplement (JEVITY 1.2 CAL) liquid 1,000 mL  . feeding supplement (PRO-STAT SUGAR FREE 64) liquid 30 mL  . ferumoxytol (FERAHEME) 510 mg in sodium chloride 0.9 % 100 mL IVPB  . insulin aspart (novoLOG) injection 0-15 Units  . insulin aspart (novoLOG) injection 3 Units  . insulin glargine (LANTUS) injection 10 Units  . ipratropium-albuterol (DUONEB) 0.5-2.5 (3) MG/3ML nebulizer solution 3 mL  . MEDLINE mouth rinse  . nitroGLYCERIN 1 mg/10 mL (100 mcg/mL) - IR/CATH LAB  . potassium chloride 20 MEQ/15ML (10%) solution 40 mEq  . Radial Cocktail (nitroglycerin/verapamil/heparin) for IR  . sodium chloride flush (NS) 0.9 % injection 10-40 mL  . sodium chloride flush (NS) 0.9 % injection 10-40 mL   Outpatient Encounter Medications as of 08/29/2019  Medication Sig Note  . amLODipine (NORVASC) 10 MG tablet Take 1 tablet (10 mg total) by mouth daily.   Marland Kitchen aspirin EC 81 MG tablet Take 81 mg by mouth every evening.   . blood glucose meter kit and supplies KIT Dispense based on patient and insurance preference. Use up to four times daily as directed. (FOR ICD-9 250.00, 250.01). 06/13/2018: Bought ReliON meter from [employer]    . Blood Glucose Monitoring Suppl (TRUE METRIX METER) w/Device KIT 1 each by Does not apply route in the morning, at noon, in the evening, and at bedtime.   . Calcium Carbonate-Vitamin D (CALCIUM-D PO) Take 2 tablets by mouth daily at 12 noon.    . carvedilol (COREG) 6.25 MG  tablet TAKE 1 TABLET(6.25 MG) BY MOUTH TWICE DAILY (Patient taking differently: Take 6.25 mg by mouth 2 (two) times daily with a meal. )   . Evolocumab (REPATHA SURECLICK) 381 MG/ML SOAJ Inject 140 mg into the skin every 14 (fourteen) days. 08/06/2019: Pt has not started med   . glucose blood (TRUE METRIX BLOOD GLUCOSE TEST) test strip Check blood sugar 4 times a day before meals and bedtime   . hydrALAZINE  (APRESOLINE) 50 MG tablet Take 1 tablet (50 mg total) by mouth 2 (two) times daily.   . Insulin Degludec-Liraglutide (XULTOPHY) 100-3.6 UNIT-MG/ML SOPN Inject 30 Units into the skin daily.   . Insulin Glargine-Lixisenatide (SOLIQUA) 100-33 UNT-MCG/ML SOPN Inject 25 Units into the skin daily. (Patient taking differently: Inject 30 Units into the skin daily. )   . Multiple Vitamin (MULTIVITAMIN PO) Take 1 tablet by mouth daily at 12 noon.    . Omega-3 Fatty Acids (OMEGA-3 PLUS PO) Take 2 capsules by mouth daily at 12 noon.   Marland Kitchen telmisartan-hydrochlorothiazide (MICARDIS HCT) 40-12.5 MG tablet Take 1 tablet by mouth daily.   . ticagrelor (BRILINTA) 90 MG TABS tablet Take 1 tablet (90 mg total) by mouth 2 (two) times daily.   . vitamin C (ASCORBIC ACID) 250 MG tablet Take 500 mg by mouth daily at 12 noon.      Objective:  Lab Results  Component Value Date   HGBA1C 11.2 (H) 05/22/2019   HGBA1C 11.3 (H) 11/27/2018   HGBA1C 12.3 (H) 10/07/2018   Lab Results  Component Value Date   MICROALBUR 80 01/08/2019   LDLCALC 132 (H) 05/22/2019   CREATININE 0.87 08/29/2019   BP Readings from Last 3 Encounters:  08/29/19 131/64  08/18/19 (!) 120/56  08/18/19 (!) 150/70    Plan:   The care management team will continue to monitor for IP disposition and outreach to the patient accordingly.   Barb Merino, RN, BSN, CCM Care Management Coordinator [facility]  Direct Phone: [phone]

## 2019-08-29 NOTE — Progress Notes (Signed)
Clinical Pharmacist Progress Note  Patient has received 1 dose of Feraheme (second dose to be given tomorrow) and one dose of Aranesp for low hgb and cannot accept blood products. Hgb is down to 4.7 today. Literature review around epo dosing with Jehovah's Witnesses suggest that the benefit was seen at higher doses than what is currently prescribed with Aranesp. Spoke with CCM and will increase dose to 100 mcg weekly. We will give an additional 60 mcg today since she has received 40 mcg on 08/27/19.  Danae Orleans, PharmD, [facility] PGY2 Cardiology Pharmacy Resident Phone [phone] 08/29/2019       3:44 PM  Please check [email or web address] for unit-specific pharmacist phone numbers

## 2019-08-29 NOTE — Progress Notes (Signed)
PROGRESS NOTE  Donna [name] NIO:[id number] DOB: [birth year] DOA: 08/25/2019 PCP: Arnette Felts, FNP   LOS: 4 days   Brief narrative:  Donna [name] is an 69 y.o. female with history of  CVA (2020); OSA; DM type II; essential HTN; and hyperlipidemia who was admitted by interventional radiology on 6/14 status post R ICA angioplasty/stent placement.  There was report of acute on chronic left-sided weakness with altered mentation with significant weakness on the left side.  Hospitalist team was consulted for further evaluation  Assessment/Plan:   Principal Problem:   Internal carotid artery stenosis, right Active Problems:   Essential hypertension   Hyperlipidemia   Cerebral infarction (HCC)   Type 2 diabetes mellitus with vascular disease (HCC)   AKI (acute kidney injury) (HCC)   Anemia   Acute respiratory failure with hypoxia (HCC)  Right ICA stenosis -status post revascularization of Right  ICA stenosis via angioplasty. Vascular surgery on board.   Acute hypoxic respiratory failure.  Patient received IV Lasix, DuoNeb's.  Chest x-ray from 08/28/2019 showed increased interstitial opacity question edema/airspace disease in the upper lobes infection not excluded.  Chest x-ray from 08/29/2019 showed stable bilateral interstitial prominence .  Received 1 dose of Lasix and Solu-Medrol today.  On 5 L of nasal cannula oxygen today.  PCCM on board.   Fever.  Temperature max of 101.79 F  Need to closely monitor.  There is high risk of aspiration secondary to decreased mentation.  Will send for blood cultures.  Strictly with similar findings compared to yesterday.  Hypotension requiring intermittent pressors.   Metabolic encephalopathy.  Likely multifactorial from hypoperfusion, severe anemia, CA watershed infarct, left parietal and cerebellar infarct.  Is more alert awake and communicative today.  Acute CVA Patient with prior h/o CVA with left-sided deficits. Despite right ICA angioplasty,  weakness progressed with altered mental status. MRI/MRA confirmed a small solitary infract in Left parietal cortex and right cerebellum as well as scattered small acute infarcts along the R MCA/watershed territory.  Neurology on board.  Continue physical therapy, occupational therapy.  Platelets currently on hold.  CT angiogram of the abdomen and pelvis with right groin and retroperitoneal hematoma.  Neurology on board.  Acute blood loss anemia  Likely secondary to mild to moderate groin and retroperitoneal hematoma.  Hemoglobin of 4.7 today.  Patient is Jehovah's Witness.  Received erythropoietin and iron infusion    Hypophosphatemia.  Replace with potassium phosphate, check levels in a.m.  Hypokalemia.  Potassium of 2.8 today.  Will replace with potassium phosphate and via tube.  Acute kidney injury. Improved. Creatinine of 0.9.  Likely secondary to hypoperfusion.  Essential HTN Currently not on any antihypertensives.  Hyperlipidemia -Patient is on repatha for statin-intolerant (myopathy) as outpatient.  DM type II A1c was 11.2 on 3/11. On [place] as an outpatient,on hold. continue on SSI, basal Lantus 10 units .  Will closely monitor blood glucose levels.   Leukocytosis WBC of 13.1 today from 11.6.  Off antibiotic.  Will need to closely monitor.  Prognosis.  Prognosis of the patient is guarded due to severe anemia, cerebral multiple infarcts, encephalopathy, poor nutritional status   VTE Prophylaxis: Sequential compression device  Code Status: Full code  Family Communication: None today.    Consultants:  Neurology  Critical care  Procedures:  Right ICA angioplasty  Antibiotics:  . none  Anti-infectives (From admission, onward)   Start     Dose/Rate Route Frequency Ordered Stop   08/25/19 0723  ceFAZolin (  ANCEF) IVPB 2g/100 mL premix        2 g 200 mL/hr over 30 Minutes Intravenous 60 min pre-op 08/25/19 0723 08/25/19 0930      Subjective: Today, patient was seen and examined at bedside.  Appears to be more alert awake communicative today.  Denies any pain shortness of breath, cough .  Patient had pulled out cortrak tube  Objective: Vitals:   08/29/19 0500 08/29/19 0600  BP: 138/68 (!) 152/113  Pulse: 95 (!) 106  Resp: (!) 22 (!) 26  Temp:    SpO2: 100% 94%    Intake/Output Summary (Last 24 hours) at 08/29/2019 0728 Last data filed at 08/29/2019 0600 Gross per 24 hour  Intake 970.72 ml  Output 1650 ml  Net -679.28 ml   Filed Weights   08/25/19 0705  Weight: 68.5 kg   Body mass index is 28.53 kg/m.   Physical Exam:  GENERAL: Patient is alert awake and communicative today.  Answering appropriately.  On nasal cannula oxygen.  Appears chronically ill HENT: Pallor noted.  Oral mucosa is moist.  Off cortrak tube today NECK: is supple, no gross swelling noted. CHEST: Diminished breath sounds bilaterally.  Coarse breath sounds noted. CVS: S1 and S2 heard, no murmur. Regular rate and rhythm.  ABDOMEN: Soft, non-tender, bowel sounds are present. EXTREMITIES: Left hemiplegia.   Right thigh with marking.    Right upper extremity PICC line in place. CNS: Left-sided hemiparesis.  SKIN: warm and dry without rashes.  Data Review: I have personally reviewed the following laboratory data and studies,  CBC: Recent Labs  Lab 08/25/19 0715 08/25/19 0715 08/26/19 0555 08/26/19 0555 08/26/19 0732 08/26/19 2244 08/27/19 0230 08/27/19 0529 08/27/19 0546 08/28/19 0500 08/28/19 0505 08/28/19 1608 08/29/19 0457  WBC 4.8   < > 11.4*  --  11.8*  --  11.6*  --   --  13.1*  --   --  10.2  NEUTROABS 2.4  --  8.9*  --  8.9*  --   --   --   --   --   --   --   --   HGB 13.3   < > 6.9*   < > 7.0*   < > 4.8*   < > 6.1* 5.2* 11.9* 6.8* 4.7*  HCT 42.2   < > 20.4*   < > 20.4*   < > 15.1*   < > 18.0* 16.5* 35.0* 20.0* 15.6*  MCV 90.2   < > 91.1  --  89.9  --  91.5  --   --  93.2  --   --  98.1  PLT 283   < > 206  --   223  --  182  --   --  177  --   --  177   < > = values in this interval not displayed.   Basic Metabolic Panel: Recent Labs  Lab 08/26/19 0555 08/26/19 0555 08/27/19 0230 08/27/19 0230 08/27/19 0546 08/28/19 0500 08/28/19 0505 08/28/19 1608 08/29/19 0457  NA 134*   < > 141   < > 144 143 144 146* 147*  K 4.5   < > 3.5   < > 3.4* 3.7 3.5 3.3* 2.8*  CL 103  --  114*  --   --  111  --   --  112*  CO2 17*  --  19*  --   --  22  --   --  27  GLUCOSE 289*  --  189*  --   --  316*  --   --  177*  BUN 31*  --  21  --   --  13  --   --  14  CREATININE 1.78*  --  1.12*  --   --  0.96  --   --  0.87  CALCIUM 8.0*  --  7.6*  --   --  8.3*  --   --  8.2*  MG  --   --   --   --   --  1.9  --   --  2.0  PHOS  --   --   --   --   --   --   --   --  1.7*   < > = values in this interval not displayed.   Liver Function Tests: Recent Labs  Lab 08/27/19 0230 08/28/19 0500 08/29/19 0457  AST 21 29 22   ALT 21 24 21   ALKPHOS 36* 46 42  BILITOT 0.1* 0.9 0.8  PROT 4.9* 5.7* 5.5*  ALBUMIN 2.4* 2.7* 2.4*   No results for input(s): LIPASE, AMYLASE in the last 168 hours. No results for input(s): AMMONIA in the last 168 hours. Cardiac Enzymes: No results for input(s): CKTOTAL, CKMB, CKMBINDEX, TROPONINI in the last 168 hours. BNP (last 3 results) No results for input(s): BNP in the last 8760 hours.  ProBNP (last 3 results) No results for input(s): PROBNP in the last 8760 hours.  CBG: Recent Labs  Lab 08/28/19 1105 08/28/19 1527 08/28/19 1908 08/28/19 2302 08/29/19 0314  GLUCAP 236* 237* 162* 155* 186*   Recent Results (from the past 240 hour(s))  SARS CORONAVIRUS 2 (TAT 6-24 HRS) Nasopharyngeal Nasopharyngeal Swab     Status: None   Collection Time: 08/21/19 12:45 PM   Specimen: Nasopharyngeal Swab  Result Value Ref Range Status   SARS Coronavirus 2 NEGATIVE NEGATIVE Final    Comment: (NOTE) SARS-CoV-2 target nucleic acids are NOT DETECTED.  The SARS-CoV-2 RNA is generally  detectable in upper and lower respiratory specimens during the acute phase of infection. Negative results do not preclude SARS-CoV-2 infection, do not rule out co-infections with other pathogens, and should not be used as the sole basis for treatment or other patient management decisions. Negative results must be combined with clinical observations, patient history, and epidemiological information. The expected result is Negative.  Fact Sheet for Patients: 08/31/19  Fact Sheet for Healthcare Providers: 10/21/19  This test is not yet approved or cleared by the [place] FDA and  has been authorized for detection and/or diagnosis of SARS-CoV-2 by FDA under an Emergency Use Authorization (EUA). This EUA will remain  in effect (meaning this test can be used) for the duration of the COVID-19 declaration under Se ction 564(b)(1) of the Act, 21 U.S.C. section 360bbb-3(b)(1), unless the authorization is terminated or revoked sooner.  Performed at [facility], [address], [place], [address] [place]   MRSA PCR Screening     Status: None   Collection Time: 08/25/19  3:42 PM   Specimen: Nasopharyngeal  Result Value Ref Range Status   MRSA by PCR NEGATIVE NEGATIVE Final    Comment:        The GeneXpert MRSA Assay (FDA approved for NASAL specimens only), is one component of a comprehensive MRSA colonization surveillance program. It is not intended to diagnose MRSA infection nor to guide or monitor treatment for MRSA infections. Performed at [facility], [address] [place], [place] [address]      Studies: DG CHEST PORT 1 VIEW  Result Date: 08/28/2019 CLINICAL DATA:  Hypoxia EXAM: PORTABLE CHEST 1 VIEW COMPARISON:  Yesterday FINDINGS: Increased generalized interstitial opacity. There is also more dense hazy opacity over the upper lungs, usually scarring in this location but not seen in 2020.  Right PICC with tip at the SVC. Feeding tube which reaches the stomach IMPRESSION: Increased interstitial opacity, question edema. There also appears to be airspace disease in the upper lobes, infection not excluded. Electronically Signed   By: Marnee SpringJonathon  Watts M.D.   On: 08/28/2019 04:13   DG CHEST PORT 1 VIEW  Result Date: 08/27/2019 CLINICAL DATA:  Wheezing EXAM: PORTABLE CHEST 1 VIEW COMPARISON:  11/29/2018 FINDINGS: Esophageal tube tip is below the diaphragm but incompletely visualized. Right upper extremity central venous catheter tip over the SVC. Mild diffuse interstitial opacity with subtle nodularity. Stable cardiomediastinal silhouette with aortic atherosclerosis. No pneumothorax. IMPRESSION: Mild diffuse increased interstitial opacity with subtle nodularity, potentially due to atypical infection. Electronically Signed   By: Jasmine PangKim  Fujinaga M.D.   On: 08/27/2019 19:20   ECHOCARDIOGRAM COMPLETE  Result Date: 08/27/2019    ECHOCARDIOGRAM REPORT   Patient Name:   Donna [name] Date of Exam: 08/27/2019 Medical Rec #:  [id number]       Height:       61.0 in Accession #:    [id number]      Weight:       151.0 lb Date of Birth:  Aug 05, 1950       BSA:          1.676 m Patient Age:    69 years        BP:           117/53 mmHg Patient Gender: F               HR:           106 bpm. Exam Location:  Inpatient Procedure: 2D Echo Indications:    Stroke  History:        Patient has prior history of Echocardiogram examinations, most                 recent 10/08/2018. Risk Factors:Hypertension, Dyslipidemia and                 Diabetes.  Sonographer:    Thurman Coyerasey Kirkpatrick RDCS (AE) Referring Phys: [id number] Marvel PlanJINDONG XU IMPRESSIONS  1. Left ventricular ejection fraction, by estimation, is 60 to 65%. The left ventricle has normal function. The left ventricle has no regional wall motion abnormalities. There is moderate left ventricular hypertrophy of the basal-septal segment. Left ventricular diastolic function could not be  evaluated.  2. Right ventricular systolic function is normal. The right ventricular size is normal. Tricuspid regurgitation signal is inadequate for assessing PA pressure.  3. The mitral valve is normal in structure. Trivial mitral valve regurgitation. No evidence of mitral stenosis.  4. The aortic valve was not well visualized. Aortic valve regurgitation is not visualized. No aortic stenosis is present.  5. The inferior vena cava is normal in size with greater than 50% respiratory variability, suggesting right atrial pressure of 3 mmHg. FINDINGS  Left Ventricle: Left ventricular ejection fraction, by estimation, is 60 to 65%. The left ventricle has normal function. The left ventricle has no regional wall motion abnormalities. The left ventricular internal cavity size was normal in size. There is  moderate left ventricular hypertrophy of the  basal-septal segment. Left ventricular diastolic function could not be evaluated. Right Ventricle: The right ventricular size is normal. No increase in right ventricular wall thickness. Right ventricular systolic function is normal. Tricuspid regurgitation signal is inadequate for assessing PA pressure. Left Atrium: Left atrial size was normal in size. Right Atrium: Right atrial size was normal in size. Pericardium: There is no evidence of pericardial effusion. Mitral Valve: The mitral valve is normal in structure. Normal mobility of the mitral valve leaflets. Severe mitral annular calcification. Trivial mitral valve regurgitation. No evidence of mitral valve stenosis. MV peak gradient, 18.5 mmHg. The mean mitral valve gradient is 9.0 mmHg. Tricuspid Valve: The tricuspid valve is normal in structure. Tricuspid valve regurgitation is not demonstrated. No evidence of tricuspid stenosis. Aortic Valve: The aortic valve was not well visualized. . There is moderate thickening and moderate calcification of the aortic valve. Aortic valve regurgitation is not visualized. No aortic  stenosis is present. There is moderate thickening of the aortic  valve. There is moderate calcification of the aortic valve. Pulmonic Valve: The pulmonic valve was normal in structure. Pulmonic valve regurgitation is not visualized. No evidence of pulmonic stenosis. Aorta: The aortic root is normal in size and structure. Venous: The inferior vena cava is normal in size with greater than 50% respiratory variability, suggesting right atrial pressure of 3 mmHg. IAS/Shunts: No atrial level shunt detected by color flow Doppler.  LEFT VENTRICLE PLAX 2D LVIDd:         4.20 cm LVIDs:         2.90 cm LV PW:         1.10 cm LV IVS:        1.30 cm LVOT diam:     2.00 cm LV SV:         45 LV SV Index:   27 LVOT Area:     3.14 cm  RIGHT VENTRICLE RV S prime:     10.90 cm/s LEFT ATRIUM             Index       RIGHT ATRIUM          Index LA diam:        3.00 cm 1.79 cm/m  RA Area:     5.38 cm LA Vol (A2C):   37.0 ml 22.07 ml/m RA Volume:   7.65 ml  4.56 ml/m LA Vol (A4C):   23.0 ml 13.72 ml/m LA Biplane Vol: 31.4 ml 18.73 ml/m  AORTIC VALVE LVOT Vmax:   90.60 cm/s LVOT Vmean:  63.800 cm/s LVOT VTI:    0.142 m  AORTA Ao Root diam: 2.80 cm MITRAL VALVE MV Area (PHT): 4.49 cm     SHUNTS MV Peak grad:  18.5 mmHg    Systemic VTI:  0.14 m MV Mean grad:  9.0 mmHg     Systemic Diam: 2.00 cm MV Vmax:       2.15 m/s MV Vmean:      142.0 cm/s MV Decel Time: 169 msec MV E velocity: 155.00 cm/s MV A velocity: 172.00 cm/s MV E/A ratio:  0.90 Armanda Magic MD Electronically signed by Armanda Magic MD Signature Date/Time: 08/27/2019/12:03:05 PM    Final       Joycelyn Das, MD  [facility] 08/29/2019

## 2019-08-29 NOTE — Progress Notes (Signed)
SLP Cancellation Note  Patient Details Name: Donna [name] MRN: [id number] DOB: 1950/10/03   Cancelled treatment:       Reason Eval/Treat Not Completed: Medical issues which prohibited therapy. Order received for swallow evaluation. In reviewing the chart and discussing with RN, although pt is more alert today she is also in more respiratory distress and at risk for intubation. Will hold PO trials for now and f/u as able.   Mahala Menghini., M.A. CCC-SLP Acute Rehabilitation Services Pager [phone] Office [phone]  08/29/2019, 9:54 AM

## 2019-08-29 NOTE — Procedures (Signed)
Cortrak  Person Inserting Tube:  Vana Arif E, RD Tube Type:  Cortrak - 43 inches Tube Location:  Left nare Initial Placement:  Stomach Secured by: Bridle Technique Used to Measure Tube Placement:  Documented cm marking at nare/ corner of mouth Cortrak Secured At:  60 cm    Cortrak Tube Team Note:  Consult received to place a Cortrak feeding tube.   No x-ray is required. RN may begin using tube.   If the tube becomes dislodged please keep the tube and contact the Cortrak team at [email or web address] ([email or web address]) for replacement.  If after hours and replacement cannot be delayed, place a NG tube and confirm placement with an abdominal x-ray.    Theressa Piedra, MS, RD, LDN RD pager number and weekend/on-call pager number located in Amion.   

## 2019-08-29 NOTE — Progress Notes (Signed)
STROKE TEAM PROGRESS NOTE   INTERVAL HISTORY  RN at bedside. Pt lying in bed, eyes spontaneously open, but still has right gaze and left neglect, left hemiplegia. Able to follow simple commands but not fully orientated. Hb done to 4.7 this am.    Vitals:   08/29/19 0100 08/29/19 0200 08/29/19 0300 08/29/19 0400  BP: (!) 144/64 129/60 (!) 139/121 134/74  Pulse: (!) 105 83 (!) 106 97  Resp: (!) 22 17 (!) 23 (!) 23  Temp:    98.8 F (37.1 C)  TempSrc:    Axillary  SpO2: 100% 100% 100% 100%  Weight:      Height:       CBC:  Recent Labs  Lab 08/26/19 0555 08/26/19 0555 08/26/19 0732 08/26/19 2244 08/27/19 0230 08/27/19 0529 08/28/19 0500 08/28/19 0500 08/28/19 0505 08/28/19 1608  WBC 11.4*   < > 11.8*  --  11.6*  --  13.1*  --   --   --   NEUTROABS 8.9*  --  8.9*  --   --   --   --   --   --   --   HGB 6.9*   < > 7.0*   < > 4.8*   < > 5.2*   < > 11.9* 6.8*  HCT 20.4*   < > 20.4*   < > 15.1*   < > 16.5*   < > 35.0* 20.0*  MCV 91.1   < > 89.9  --  91.5  --  93.2  --   --   --   PLT 206   < > 223  --  182  --  177  --   --   --    < > = values in this interval not displayed.   Basic Metabolic Panel:  Recent Labs  Lab 08/28/19 0500 08/28/19 0505 08/28/19 1608 08/29/19 0457  NA 143   < > 146* 147*  K 3.7   < > 3.3* 2.8*  CL 111  --   --  112*  CO2 22  --   --  27  GLUCOSE 316*  --   --  177*  BUN 13  --   --  14  CREATININE 0.96  --   --  0.87  CALCIUM 8.3*  --   --  8.2*  MG 1.9  --   --  2.0  PHOS  --   --   --  1.7*   < > = values in this interval not displayed.   Lipid Panel:     Component Value Date/Time   CHOL 217 (H) 05/22/2019 1312   TRIG 114 05/22/2019 1312   HDL 65 05/22/2019 1312   CHOLHDL 3.3 05/22/2019 1312   LDLCALC 132 (H) 05/22/2019 1312   HgbA1c:  Lab Results  Component Value Date   HGBA1C 11.2 (H) 05/22/2019   Urine Drug Screen: No results found for: LABOPIA, COCAINSCRNUR, LABBENZ, AMPHETMU, THCU, LABBARB  Alcohol Level No results found  for: ETH  IMAGING past 24 hours No results found.  PHYSICAL EXAM  Temp:  [98.8 F (37.1 C)-101.7 F (38.7 C)] 98.8 F (37.1 C) (06/18 0400) Pulse Rate:  [72-107] 97 (06/18 0400) Resp:  [17-28] 23 (06/18 0400) BP: (108-155)/(49-121) 134/74 (06/18 0400) SpO2:  [95 %-100 %] 100 % (06/18 0400) Arterial Line BP: (123-165)/(42-106) 155/106 (06/17 1600) FiO2 (%):  [36 %] 36 % (06/17 1527)  General - Well nourished, well developed, lethargic but more awake alert  Ophthalmologic - fundi not visualized due to noncooperation.  Cardiovascular - Regular rhythm and rate.  Neuro - lethargic but more awake alert than before, eyes spontaneously open, orientated to age and month but not able to name the hospital. Following simple commands on the right hand and foot. With forced eye opening, right gaze preference, not cross midline. Left neglect, not blinking to visual threat on the left. PERRL. Left facial droop. Left UE 1/5 and LLE 0/5 with pain stimulation. RUE 3/5 at least and RLE 2/5 with pain. Sensation, coordination not cooperative and gait not tested.   ASSESSMENT/[name] [name] is a 69 y.o. 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 MRI with scattered R MCA watershed infarcts, L frontal cortex and R cerebellar infarcts.   Intracranial Stenoses s/p angioplasty History of stroke  06/2018 R lentiform nucleus, external and extreme capsule infarct. mild periventricular small vessel disease. multiple uncontrolled risk factors. LDL 131 and A1C 11.4. OP w/u. Put on plavix but non-compliant. Intolerant to statins.   Found to have severe intracranial stenoses on MRA - referred to Dr. Corliss Skains.     10/2018 cerebral angio - 85% stenosis of the mid M1 segment of the right MCA. Severe high-grade approximately 90% stenosis of left VBJ, and of 60% just distal to the origin of the AICA. Approximately 50% stenosis at the origin of the right ICA at the bulb, and also about 60% of the right ICA at the cervical petrous junction.   [name] for 11/2018 procedure but P2Y12 was high, plavix changed to brilinta and procedure postponed.   08/25/2019 (Dr. Corliss Skains) S/p R cervical ICA/Petrous jxn angioplasty 08/25/19 ->25% residual stenosis. Severe stenosis of RT MCA M 1 seg with improved flow post angioplasty.    post-procedure worsening L sided weakness and neglect  MRI with scattered R MCA watershed infarcts, L frontal cortex and R cerebellar infarcts.   Hematoma R Thigh Severe Acute Blood Loss Anemia  CT abd/pelvis 6/15 large R groin retroperitoneal hematoma iliac & inguinal mostly in muscle and soft tissue  Hgb 13.3->6.9->7->5.1->4.8->5.0->5.2->4.7  P2Y12 164  [facility] consult (Dr Edilia Bo) - no procedure needed  Pt is a Je[employer]'s [employer] - refuses all blood products including albumin, son confirmed  Aspirin and brilinta now on hold  Not safe for wound exploration d/t risk of additional blood loss  Hgb drop with dilutional component d/t increased fluids to maintain stent patency  Femstop considered, not felt to be actively bleeding  Repeat CT abd/pelvis 6/16 no active bleeding  Treated with IV iron/erythropoietin following consult w/ [facility] Liason Committee  Stroke:   R MCA scattered watershed, L frontal and R cerebellar infarcts following R ICA/P2 jxn angioplasty   MRI  Scattered R MCA/watershed infarcts. L parietal cortex infarct. R cerebellar infarct.  MRA   Improved R ICA patency. Severe R M1 stenosis. High-grade L V4 and moderate mid BA stenosis.   CT head No progression of infarcts or hemorrhage  2D echo EF 60-65%. No source of embolus  aspirin 81 mg daily and Brilinta  (ticagrelor) 90 mg bid prior to admission, continue hold given hmg   Therapy recommendations:  - not medically ready for therapy  Disposition:  pending   Acute Respiratory distress   Increasing O2 demand  Received one dose  of lasix 6/16  On low dose precedex -> off now   CCM on board  Hypotension  BP goal 120-160  PICC line placed  Treated w/ Max neo -> now off  BP now ~120 (SBP 129->144)  CCM onboard    Long-term BP goal 130-150 given intracranial stenoses  Hyperlipidemia  Home meds:  Repatha & fish oil  Intolerant to statins  LDL 132 in March, goal < 70  Continue Repatha at discharge  Diabetes type II Uncontrolled  HgbA1c 11.2 in March, goal < 7.0  CBGs  SSI  Dysphagia  Secondary to stroke  NPO  Cortrak TF @ 50 & IVF @ 25  Speech on board  Other Stroke Risk Factors  Advanced age  Overweight, Body mass index is 28.53 kg/m., recommend weight loss, diet and exercise as appropriate   Hx stroke/TIA (see above)  Family hx stroke (mother)  Snoring. OP sleep eval showed mild Obstructive sleep apnea overall but severe apnea during REM w/ CPAP recommended   Other Active Problems  Hx noncompliance  Metabolic enceaphalopath  AKI pre-renal in setting of anemia 0.93->1.78->1.12->0.96  Leukocytosis 11.8->11.4->11.6->13.1  Hypokalemia 4.5->3.5->3.4->3.7->3.5->3.3 ->2.8-> supplement (Cortrak)  Hx palpitations on coreg, event monitor ordered 09/2018 but never completed   Fever Tmax 100.9->101.7   Hospital day # 4   This patient is critically ill due to stroke post procedure, severe anemia, hematoma on the thigh, hypotension and at significant risk of neurological worsening, death form severe anemia, bleeding, recurrent stroke, intracranial stenosis. This patient's care requires constant monitoring of vital signs, hemodynamics, respiratory and cardiac monitoring, review of multiple databases, neurological assessment, discussion with family,  other specialists and medical decision making of high complexity. I spent 35 minutes of neurocritical care time in the care of this patient  Donna Plan, MD PhD Stroke Neurology 08/29/2019 11:32 PM      To contact Stroke Continuity provider, please refer to [email or web address]. After hours, contact General Neurology

## 2019-08-29 NOTE — Progress Notes (Signed)
Referring Physician(s): Dr. Jaynee Eagles  Supervising Physician: Luanne Bras  Patient Status:  [facility] - In-pt  Chief Complaint: Right ICA cervical/petrous junction stenosis s/p revascularization using balloon angioplasty via right radial and right femoral approach6/14/2021by Dr. Estanislado Pandy. R groin/RP hematoma with critically low hemoglobin  Subjective: Patient arousable.  Alert and able to answer questions appropriately Was reportedly in more respiratory distress this AM, however post diuresis, solumedrol, and nebulizer now breathing more comfortably.   Pulled her NGT out. Will need to be replaced.  Hemoglobin down to 4.7 this AM.  Thigh unchanged, no evidence of rebleeding.  No family at bedside this AM.   Allergies: Patient has no known allergies.  Medications: Prior to Admission medications   Medication Sig Start Date End Date Taking? Authorizing Provider  amLODipine (NORVASC) 10 MG tablet Take 1 tablet (10 mg total) by mouth daily. 05/12/19  Yes Minette Brine, FNP  aspirin EC 81 MG tablet Take 81 mg by mouth every evening.   Yes [provider]  Calcium Carbonate-Vitamin D (CALCIUM-D PO) Take 2 tablets by mouth daily at 12 noon.    Yes [provider]  carvedilol (COREG) 6.25 MG tablet TAKE 1 TABLET(6.25 MG) BY MOUTH TWICE DAILY Patient taking differently: Take 6.25 mg by mouth 2 (two) times daily with a meal.  07/31/19  Yes Minette Brine, FNP  hydrALAZINE (APRESOLINE) 50 MG tablet Take 1 tablet (50 mg total) by mouth 2 (two) times daily. 06/16/19 09/14/19 Yes Elouise Munroe, MD  Insulin Degludec-Liraglutide (XULTOPHY) 100-3.6 UNIT-MG/ML SOPN Inject 30 Units into the skin daily. 08/14/19  Yes Minette Brine, FNP  telmisartan-hydrochlorothiazide (MICARDIS HCT) 40-12.5 MG tablet Take 1 tablet by mouth daily. 07/31/19  Yes Minette Brine, FNP  ticagrelor (BRILINTA) 90 MG TABS tablet Take 1 tablet (90 mg total) by mouth 2 (two) times daily. 06/12/19  Yes Elouise Munroe, MD  vitamin C (ASCORBIC ACID) 250 MG tablet Take 500 mg by mouth daily at 12 noon.   Yes [provider]  blood glucose meter kit and supplies KIT Dispense based on patient and insurance preference. Use up to four times daily as directed. (FOR ICD-9 250.00, 250.01). 05/10/18   Minette Brine, FNP  Blood Glucose Monitoring Suppl (TRUE METRIX METER) w/Device KIT 1 each by Does not apply route in the morning, at noon, in the evening, and at bedtime. 05/22/19   Minette Brine, FNP  Evolocumab (REPATHA SURECLICK) 470 MG/ML SOAJ Inject 140 mg into the skin every 14 (fourteen) days. 06/30/19   Elouise Munroe, MD  glucose blood (TRUE METRIX BLOOD GLUCOSE TEST) test strip Check blood sugar 4 times a day before meals and bedtime 05/22/19   Minette Brine, FNP  Insulin Glargine-Lixisenatide (SOLIQUA) 100-33 UNT-MCG/ML SOPN Inject 25 Units into the skin daily. Patient taking differently: Inject 30 Units into the skin daily.  07/14/19   Minette Brine, FNP  Multiple Vitamin (MULTIVITAMIN PO) Take 1 tablet by mouth daily at 12 noon.     [provider]  Omega-3 Fatty Acids (OMEGA-3 PLUS PO) Take 2 capsules by mouth daily at 12 noon.    [provider]     Vital Signs: BP (!) 135/52   Pulse 87   Temp (!) 100.5 F (38.1 C) (Axillary)   Resp 16   Ht _0  (1.549 m)   Wt 151 lb (68.5 kg)   SpO2 100%   BMI 28.53 kg/m   Physical Exam  NAD, alert, appears uncomfortable at time, but denies  pain. Chest: increased WOB, audible expiratory wheezing, rhonchi throughout Heart: tachycardia Groin: R groin puncture site soft, thigh more compressible and soft.  No extension of bruising.  No active oozing/bleeding. Neuro: remains drowsy and at times is confused, however answers questions appropriately and answered orientation questions correctly.  Following command.  No movement noted on left although attempt is made.   Imaging: CT ABDOMEN PELVIS WO CONTRAST  Result Date:  08/26/2019 CLINICAL DATA:  Abdominal distension. EXAM: CT ABDOMEN AND PELVIS WITHOUT CONTRAST TECHNIQUE: Multidetector CT imaging of the abdomen and pelvis was performed following the standard protocol without IV contrast. COMPARISON:  None. FINDINGS: Lower chest: No acute abnormality. Hepatobiliary: No focal liver abnormality is seen. No gallstones, gallbladder wall thickening, or biliary dilatation. Pancreas: Unremarkable. No pancreatic ductal dilatation or surrounding inflammatory changes. Spleen: Normal in size without focal abnormality. Adrenals/Urinary Tract: Adrenal glands and kidneys are unremarkable. No hydronephrosis or renal obstruction is noted. Foley catheter is noted within urinary bladder. Stomach/Bowel: Stomach is within normal limits. Appendix appears normal. No evidence of bowel wall thickening, distention, or inflammatory changes. Vascular/Lymphatic: Atherosclerosis of abdominal aorta is noted without aneurysm formation. There does appear to be a mild to moderate amount of retroperitoneal hematoma in the right iliac and inguinal regions. Moderate size hematoma is noted in the soft tissues anterior to the musculature of the right hip. No definite intraperitoneal hemorrhage is noted. Reproductive: Status post hysterectomy. No adnexal masses. Other: Moderate size fat containing periumbilical hernia is noted. No ascites is noted. Musculoskeletal: No acute or significant osseous findings. IMPRESSION: 1. Mild to moderate amount of retroperitoneal hematoma is noted in the right iliac and inguinal regions. Moderate size hematoma is noted in the soft tissues anterior to the musculature of the right hip. No definite intraperitoneal hemorrhage is noted. These results will be called to the ordering clinician or representative by the Radiologist Assistant, and communication documented in the PACS or zVision Dashboard. 2. Moderate size fat containing periumbilical hernia. Aortic Atherosclerosis (ICD10-I70.0).  Electronically Signed   By: Marijo Conception M.D.   On: 08/26/2019 16:31   CT HEAD WO CONTRAST  Result Date: 08/27/2019 CLINICAL DATA:  Evaluate for source of hemorrhage EXAM: CT HEAD WITHOUT CONTRAST TECHNIQUE: Contiguous axial images were obtained from the base of the skull through the vertex without intravenous contrast. COMPARISON:  Brain MRI from yesterday FINDINGS: Brain: Small infarcts by brain MRI are largely occult on this study. Remote perforator infarct at the right basal ganglia and lateral left cerebellum. Mild cerebral volume loss. No hemorrhage, hydrocephalus, or masslike finding. Vascular: No hyperdense vessel or unexpected calcification. Skull: Normal. Negative for fracture or focal lesion. Sinuses/Orbits: Chronic left maxillary sinusitis with complete opacification. Patchy mucosal thickening elsewhere. IMPRESSION: 1. Known acute infarcts that are underestimated by CT. No detected progression or acute hemorrhage. 2. 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 Electronically Signed   By: Monte Fantasia M.D.   On: 08/27/2019 06:40   MR ANGIO HEAD WO CONTRAST  Result Date: 08/26/2019 CLINICAL DATA:  Right ICA angioplasty with left-sided weakness EXAM: MRI HEAD WITHOUT CONTRAST MRA HEAD WITHOUT CONTRAST TECHNIQUE: Multiplanar, multiecho pulse sequences of the brain and surrounding structures were obtained without intravenous contrast. Angiographic images of the head were obtained using MRA technique without contrast. COMPARISON:  10/09/2018 FINDINGS: MRI HEAD FINDINGS Brain: Small (subcentimeter) scattered cortical and white matter infarcts along the right cerebral convexity and in the high left parietal cortex. On the right these are in a roughly watershed distribution.  Small acute infarct in the upper right cerebellum. Small remote left cerebellar infarct Remote perforator infarct at the right basal ganglia. Mild chronic small vessel ischemic change in the cerebral white matter.  No hydrocephalus or collection. Vascular: See below Skull and upper cervical spine: Normal marrow signal Sinuses/Orbits: Chronic left maxillary sinusitis with complete opacification also seen in 2020. MRA HEAD FINDINGS The right ICA is smaller than the left. Only mild narrowing at the right cervical petrous segment, an improvement. Severe right MCA stenosis with patent downstream vasculature. No left-sided or anterior cerebral stenosis or branch occlusion. High-grade narrowing at the non dominant right V4 segment. Moderate mid basilar stenosis. Patent bilateral PCA. IMPRESSION: 1. Scattered small acute infarcts along the right MCA/watershed territory. Solitary small acute infarct in the left parietal cortex and right cerebellum. 2. Improved right ICA patency at the petrous segment. 3. Known severe right M1 segment stenosis. 4. 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 Electronically Signed   By: Monte Fantasia M.D.   On: 08/26/2019 11:05   MR BRAIN WO CONTRAST  Result Date: 08/26/2019 CLINICAL DATA:  Right ICA angioplasty with left-sided weakness EXAM: MRI HEAD WITHOUT CONTRAST MRA HEAD WITHOUT CONTRAST TECHNIQUE: Multiplanar, multiecho pulse sequences of the brain and surrounding structures were obtained without intravenous contrast. Angiographic images of the head were obtained using MRA technique without contrast. COMPARISON:  10/09/2018 FINDINGS: MRI HEAD FINDINGS Brain: Small (subcentimeter) scattered cortical and white matter infarcts along the right cerebral convexity and in the high left parietal cortex. On the right these are in a roughly watershed distribution. Small acute infarct in the upper right cerebellum. Small remote left cerebellar infarct Remote perforator infarct at the right basal ganglia. Mild chronic small vessel ischemic change in the cerebral white matter. No hydrocephalus or collection. Vascular: See below Skull and upper cervical spine: Normal marrow signal Sinuses/Orbits:  Chronic left maxillary sinusitis with complete opacification also seen in 2020. MRA HEAD FINDINGS The right ICA is smaller than the left. Only mild narrowing at the right cervical petrous segment, an improvement. Severe right MCA stenosis with patent downstream vasculature. No left-sided or anterior cerebral stenosis or branch occlusion. High-grade narrowing at the non dominant right V4 segment. Moderate mid basilar stenosis. Patent bilateral PCA. IMPRESSION: 1. Scattered small acute infarcts along the right MCA/watershed territory. Solitary small acute infarct in the left parietal cortex and right cerebellum. 2. Improved right ICA patency at the petrous segment. 3. Known severe right M1 segment stenosis. 4. 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 Electronically Signed   By: Monte Fantasia M.D.   On: 08/26/2019 11:05   DG CHEST PORT 1 VIEW  Result Date: 08/29/2019 CLINICAL DATA:  Pulmonary edema. EXAM: PORTABLE CHEST 1 VIEW COMPARISON:  08/28/2019.  08/27/2019.  11/29/2018. FINDINGS: Feeding tube and right PICC line stable position. Heart size normal. Stable mild interstitial prominence. No alveolar infiltrate. No pleural effusion or pneumothorax. No acute bony abnormality. IMPRESSION: 1.  Lines and tubes in stable position. 2. Stable mild bilateral interstitial prominence. Again mild interstitial edema and/or pneumonitis cannot be excluded. Electronically Signed   By: Marcello Moores  Register   On: 08/29/2019 08:21   DG CHEST PORT 1 VIEW  Result Date: 08/28/2019 CLINICAL DATA:  Hypoxia EXAM: PORTABLE CHEST 1 VIEW COMPARISON:  Yesterday FINDINGS: Increased generalized interstitial opacity. There is also more dense hazy opacity over the upper lungs, usually scarring in this location but not seen in 2020. Right PICC with tip at the SVC. Feeding tube which  reaches the stomach IMPRESSION: Increased interstitial opacity, question edema. There also appears to be airspace disease in the upper lobes,  infection not excluded. Electronically Signed   By: Monte Fantasia M.D.   On: 08/28/2019 04:13   DG CHEST PORT 1 VIEW  Result Date: 08/27/2019 CLINICAL DATA:  Wheezing EXAM: PORTABLE CHEST 1 VIEW COMPARISON:  11/29/2018 FINDINGS: Esophageal tube tip is below the diaphragm but incompletely visualized. Right upper extremity central venous catheter tip over the SVC. Mild diffuse interstitial opacity with subtle nodularity. Stable cardiomediastinal silhouette with aortic atherosclerosis. No pneumothorax. IMPRESSION: Mild diffuse increased interstitial opacity with subtle nodularity, potentially due to atypical infection. Electronically Signed   By: Donavan Foil M.D.   On: 08/27/2019 19:20   ECHOCARDIOGRAM COMPLETE  Result Date: 08/27/2019    ECHOCARDIOGRAM REPORT   Patient Name:   Donna [name] Filkins Date of Exam: 08/27/2019 Medical Rec #:  [id number]       Height:       61.0 in Accession #:    [id number]      Weight:       151.0 lb Date of Birth:  1950-12-06       BSA:          1.676 m Patient Age:    69 years        BP:           117/53 mmHg Patient Gender: F               HR:           106 bpm. Exam Location:  Inpatient Procedure: 2D Echo Indications:    Stroke  History:        Patient has prior history of Echocardiogram examinations, most                 recent 10/08/2018. Risk Factors:Hypertension, Dyslipidemia and                 Diabetes.  Sonographer:    Mikki Santee RDCS (AE) Referring Phys: [id number] [place]  1. Left ventricular ejection fraction, by estimation, is 60 to 65%. The left ventricle has normal function. The left ventricle has no regional wall motion abnormalities. There is moderate left ventricular hypertrophy of the basal-septal segment. Left ventricular diastolic function could not be evaluated.  2. Right ventricular systolic function is normal. The right ventricular size is normal. Tricuspid regurgitation signal is inadequate for assessing PA pressure.  3. The mitral valve  is normal in structure. Trivial mitral valve regurgitation. No evidence of mitral stenosis.  4. The aortic valve was not well visualized. Aortic valve regurgitation is not visualized. No aortic stenosis is present.  5. The inferior vena cava is normal in size with greater than 50% respiratory variability, suggesting right atrial pressure of 3 mmHg. FINDINGS  Left Ventricle: Left ventricular ejection fraction, by estimation, is 60 to 65%. The left ventricle has normal function. The left ventricle has no regional wall motion abnormalities. The left ventricular internal cavity size was normal in size. There is  moderate left ventricular hypertrophy of the basal-septal segment. Left ventricular diastolic function could not be evaluated. Right Ventricle: The right ventricular size is normal. No increase in right ventricular wall thickness. Right ventricular systolic function is normal. Tricuspid regurgitation signal is inadequate for assessing PA pressure. Left Atrium: Left atrial size was normal in size. Right Atrium: Right atrial size was normal in size. Pericardium: There is no evidence of pericardial effusion. Mitral Valve:  The mitral valve is normal in structure. Normal mobility of the mitral valve leaflets. Severe mitral annular calcification. Trivial mitral valve regurgitation. No evidence of mitral valve stenosis. MV peak gradient, 18.5 mmHg. The mean mitral valve gradient is 9.0 mmHg. Tricuspid Valve: The tricuspid valve is normal in structure. Tricuspid valve regurgitation is not demonstrated. No evidence of tricuspid stenosis. Aortic Valve: The aortic valve was not well visualized. . There is moderate thickening and moderate calcification of the aortic valve. Aortic valve regurgitation is not visualized. No aortic stenosis is present. There is moderate thickening of the aortic  valve. There is moderate calcification of the aortic valve. Pulmonic Valve: The pulmonic valve was normal in structure. Pulmonic valve  regurgitation is not visualized. No evidence of pulmonic stenosis. Aorta: The aortic root is normal in size and structure. Venous: The inferior vena cava is normal in size with greater than 50% respiratory variability, suggesting right atrial pressure of 3 mmHg. IAS/Shunts: No atrial level shunt detected by color flow Doppler.  LEFT VENTRICLE PLAX 2D LVIDd:         4.20 cm LVIDs:         2.90 cm LV PW:         1.10 cm LV IVS:        1.30 cm LVOT diam:     2.00 cm LV SV:         45 LV SV Index:   27 LVOT Area:     3.14 cm  RIGHT VENTRICLE RV S prime:     10.90 cm/s LEFT ATRIUM             Index       RIGHT ATRIUM          Index LA diam:        3.00 cm 1.79 cm/m  RA Area:     5.38 cm LA Vol (A2C):   37.0 ml 22.07 ml/m RA Volume:   7.65 ml  4.56 ml/m LA Vol (A4C):   23.0 ml 13.72 ml/m LA Biplane Vol: 31.4 ml 18.73 ml/m  AORTIC VALVE LVOT Vmax:   90.60 cm/s LVOT Vmean:  63.800 cm/s LVOT VTI:    0.142 m  AORTA Ao Root diam: 2.80 cm MITRAL VALVE MV Area (PHT): 4.49 cm     SHUNTS MV Peak grad:  18.5 mmHg    Systemic VTI:  0.14 m MV Mean grad:  9.0 mmHg     Systemic Diam: 2.00 cm MV Vmax:       2.15 m/s MV Vmean:      142.0 cm/s MV Decel Time: 169 msec MV E velocity: 155.00 cm/s MV A velocity: 172.00 cm/s MV E/A ratio:  0.90 Fransico Him MD Electronically signed by Fransico Him MD Signature Date/Time: 08/27/2019/12:03:05 PM    Final    [place] EKG SITE RITE  Result Date: 08/27/2019 If Site Rite image not attached, placement could not be confirmed due to current cardiac rhythm.  CT Angio Abd/Pel w/ and/or w/o  Result Date: 08/27/2019 CLINICAL DATA:  Acute blood loss anemia.  Recent arteriogram. EXAM: CTA ABDOMEN AND PELVIS WITHOUT AND WITH CONTRAST TECHNIQUE: Multidetector CT imaging of the abdomen and pelvis was performed using the standard protocol during bolus administration of intravenous contrast. Multiplanar reconstructed images and MIPs were obtained and reviewed to evaluate the vascular anatomy. CONTRAST:   126m OMNIPAQUE IOHEXOL 350 MG/ML SOLN COMPARISON:  Yesterday FINDINGS: VASCULAR Aorta: Diffuse atheromatous plaque.  Negative for aneurysm Celiac: Plaque at the origin  moderate narrowing. No branch occlusion. SMA: Diffusely patent without stenosis or dissection Renals: Accessory left upper pole renal artery. There is atherosclerotic plaque without proximal high-grade stenosis. IMA: Patent Inflow: Atheromatous plaque. Proximal Outflow: No visible irregularity of common femoral or visible superficial femoral arteries. Veins: Hemorrhage tracks along the external iliac vein extends into the pelvis, overall small volume for the procedure. Review of the MIP images confirms the above findings. NON-VASCULAR Lower chest: Partially covered opacity at the right lower lobe with trace left pleural effusion. The airways appear cuffed and there are a few thickened interlobular septae a. Hepatobiliary: No focal liver abnormality.Contrast excretion into the gallbladder. Pancreas: Unremarkable. Spleen: Unremarkable. Adrenals/Urinary Tract: Negative adrenals. No hydronephrosis or stone. Unremarkable bladder. Stomach/Bowel:  No obstruction. No appendicitis. Lymphatic: No mass or adenopathy.  Fatty umbilical hernia. Reproductive:No pathologic findings. Other: No ascites or pneumoperitoneum. Musculoskeletal: No acute abnormalities. Lower lumbar disc bulging and facet spurring. Case discussed with Dr. Scot Dock, fortunately the thigh remain soft. IMPRESSION: 1. No visualized active bleeding. There is unchanged and relatively mild blood products in the right groin and retroperitoneum. There was right rectus femoris expansion and hemorrhage on the preceding abdominal CT, less well covered today. 2. Mild pulmonary edema. Electronically Signed   By: Monte Fantasia M.D.   On: 08/27/2019 07:02    Labs:  CBC: Recent Labs    08/26/19 0732 08/26/19 2244 08/27/19 0230 08/27/19 0529 08/28/19 0500 08/28/19 0505 08/28/19 1608  08/29/19 0457  WBC 11.8*  --  11.6*  --  13.1*  --   --  10.2  HGB 7.0*   < > 4.8*   < > 5.2* 11.9* 6.8* 4.7*  HCT 20.4*   < > 15.1*   < > 16.5* 35.0* 20.0* 15.6*  PLT 223  --  182  --  177  --   --  177   < > = values in this interval not displayed.    COAGS: Recent Labs    11/27/18 0728 08/18/19 0705 08/26/19 1715 08/27/19 0230  INR 0.9 0.9 1.1 1.2    BMP: Recent Labs    08/26/19 0555 08/26/19 0555 08/27/19 0230 08/27/19 0546 08/28/19 0500 08/28/19 0505 08/28/19 1608 08/29/19 0457  NA 134*   < > 141   < > 143 144 146* 147*  K 4.5   < > 3.5   < > 3.7 3.5 3.3* 2.8*  CL 103  --  114*  --  111  --   --  112*  CO2 17*  --  19*  --  22  --   --  27  GLUCOSE 289*  --  189*  --  316*  --   --  177*  BUN 31*  --  21  --  13  --   --  14  CALCIUM 8.0*  --  7.6*  --  8.3*  --   --  8.2*  CREATININE 1.78*  --  1.12*  --  0.96  --   --  0.87  GFRNONAA 29*  --  50*  --  >60  --   --  >60  GFRAA 33*  --  58*  --  >60  --   --  >60   < > = values in this interval not displayed.    LIVER FUNCTION TESTS: Recent Labs    05/22/19 1312 08/27/19 0230 08/28/19 0500 08/29/19 0457  BILITOT 0.3 0.1* 0.9 0.8  AST _0 ALT 19 21  24 21  ALKPHOS 74 36* 46 42  PROT 7.5 4.9* 5.7* 5.5*  ALBUMIN 4.0 2.4* 2.7* 2.4*    Assessment and Plan: Right ICA cervical/petrous junction stenosis s/p revascularization using balloon angioplasty via right radial and right femoral approach6/14/2021by Dr. Estanislado Pandy. Mild-moderate R RP hematoma, moderate right thigh/soft tissue hematoma.  Neuro exam unchanged, although more alert during my visit.  Groin/thigh stable this AM.  No new bruising, no signs of active bleeding. Hemoglobin down to 4.7 this AM Ongoing respiratory compromise noted by increased WOB, audible wheezing, and diffuse rhonchi. Getting regular treatments/intervention with CCM. On 5L [place] at visit today.  CXR this AM stable.   No family at bedside this AM.   Continue to hold  Brilinta/Aspirin.  Appreciate CCM/neurology/TRH/vascular surgery assistance with management. NIR to follow.  Electronically Signed: Docia Barrier, PA 08/29/2019, 12:21 PM   I spent a total of 15 Minutes at the the patient's bedside AND on the patient's hospital floor or unit, greater than 50% of which was counseling/coordinating care for R ICA stenosis, R RP hematoma, R thigh hematoma.

## 2019-08-29 NOTE — Progress Notes (Signed)
PT Cancellation Note  Patient Details Name: Donna [name] MRN: [id number] DOB: 1951/02/22   Cancelled Treatment:    Reason Eval/Treat Not Completed: Medical issues which prohibited therapy. Pt with Hgb of 4.7 this morning. PT will hold at this time.   Arlyss Gandy 08/29/2019, 9:00 AM

## 2019-08-29 NOTE — Progress Notes (Signed)
OT Cancellation Note  Patient Details Name: Donna [name] MRN: [id number] DOB: Apr 28, 1950   Cancelled Treatment:    Reason Eval/Treat Not Completed: Patient not medically ready.  Hbg 4.7. Will reattempt when medically stable.  Eber Jones., OTR/L Acute Rehabilitation Services Pager [phone] Office [phone]   Jeani Hawking M 08/29/2019, 9:24 AM

## 2019-08-30 DIAGNOSIS — D62 Acute posthemorrhagic anemia: Secondary | ICD-10-CM

## 2019-08-30 LAB — URINALYSIS, COMPLETE (UACMP) WITH MICROSCOPIC
Bilirubin Urine: NEGATIVE
Glucose, UA: NEGATIVE mg/dL
Hgb urine dipstick: NEGATIVE
Ketones, ur: NEGATIVE mg/dL
Leukocytes,Ua: NEGATIVE
Nitrite: NEGATIVE
Protein, ur: 30 mg/dL — AB
Specific Gravity, Urine: 1.019 (ref 1.005–1.030)
pH: 5 (ref 5.0–8.0)

## 2019-08-30 LAB — GLUCOSE, CAPILLARY
Glucose-Capillary: 129 mg/dL — ABNORMAL HIGH (ref 70–99)
Glucose-Capillary: 149 mg/dL — ABNORMAL HIGH (ref 70–99)
Glucose-Capillary: 174 mg/dL — ABNORMAL HIGH (ref 70–99)
Glucose-Capillary: 154 mg/dL — ABNORMAL HIGH (ref 70–99)
Glucose-Capillary: 163 mg/dL — ABNORMAL HIGH (ref 70–99)
Glucose-Capillary: 346 mg/dL — ABNORMAL HIGH (ref 70–99)

## 2019-08-30 MED ORDER — ETOMIDATE 2 MG/ML IV SOLN
20.0000 mg | Freq: Once | INTRAVENOUS | Status: AC
  Administered 2019-08-30: 20 mg via INTRAVENOUS

## 2019-08-30 MED ORDER — ETOMIDATE 2 MG/ML IV SOLN: 20.0000 mg | Freq: Once | INTRAVENOUS | Status: AC

## 2019-08-30 MED ORDER — SUCCINYLCHOLINE CHLORIDE 20 MG/ML IJ SOLN
110.0000 mg | Freq: Once | INTRAMUSCULAR | Status: AC
  Administered 2019-08-30: 110 mg via INTRAVENOUS
  Filled 2019-08-30: qty 5.5

## 2019-08-30 MED ORDER — NOREPINEPHRINE 4 MG/250ML-% IV SOLN: 2.0000 ug/min | INTRAVENOUS | Status: DC

## 2019-08-30 MED ORDER — SUCCINYLCHOLINE CHLORIDE 20 MG/ML IJ SOLN: 110.0000 mg | Freq: Once | INTRAMUSCULAR | Status: AC

## 2019-08-30 MED ORDER — FENTANYL 2500MCG IN NS 250ML (10MCG/ML) PREMIX INFUSION
10.0000 ug/h | INTRAVENOUS | Status: DC
  Administered 2019-08-31: 25 ug/h via INTRAVENOUS
  Filled 2019-08-30: qty 250

## 2019-08-30 MED ORDER — NOREPINEPHRINE 4 MG/250ML-% IV SOLN
INTRAVENOUS | Status: AC
  Filled 2019-08-30: qty 250

## 2019-08-30 MED ORDER — FENTANYL CITRATE (PF) 100 MCG/2ML IJ SOLN
INTRAMUSCULAR | Status: AC
  Filled 2019-08-30: qty 2

## 2019-08-30 MED ORDER — FENTANYL CITRATE (PF) 100 MCG/2ML IJ SOLN
100.0000 ug | Freq: Once | INTRAMUSCULAR | Status: AC
  Administered 2019-08-31: 50 ug via INTRAVENOUS

## 2019-08-30 MED ORDER — SODIUM CHLORIDE 0.9 % IV SOLN
250.0000 mL | INTRAVENOUS | Status: DC
  Administered 2019-09-04 – 2019-09-23 (×8): 250 mL via INTRAVENOUS

## 2019-08-30 NOTE — Progress Notes (Signed)
Referring Physician(s): Dr. Jaynee Eagles  Supervising Physician: Luanne Bras  Patient Status:  [facility] - In-pt  Chief Complaint:  F/U stroke  Brief History:  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  She was admitted by Neuro IR on 6/14 for cerebral angiogram s/p RT ICA cervical/ petrous junction balloon angioplasty for severe stenosis via right femoral approach.    On 08/26/19 she developed hypotension, increased left sided weakness, and lethargy with Hgb drop from 13.3 to 6.9.  Post MRI/ MRA showed scattered small acute infarcts along the right MCA/ watershed territory suspected to be resultant either post procedure vs hypoperfusion.    Neurology was consulted with recommendations to keep SBP > 140 for cerebral perfusion.    A CT abd/ pelvis was obtained given concern for retroperitoneal bleed which showed a mild to moderate amount of retroperitoneal hematoma in the right iliac and inguinal regiones with moderate size hematoma noted the the soft tissues anterior to the musculature of the right hip; no definite intrapertioneal hemorrhage.   Vascular surgery was consulted with plans to observe given bleeding was mostly into the muscle and tissue.    She was started on neosynephrine to keep SBP goal > 140-160 for cerebral perfusion.    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 Hgb now down to 4.7. Currently holding ASA and Brilinta due to bleeding and dangerously low Hgb.  Subjective:  Patient lying in bed. Moaning. Does not open eyes to voice. Does not follow commands.  Allergies: Patient has no known allergies.  Medications: Prior to Admission medications   Medication Sig Start Date End Date Taking? Authorizing Provider  amLODipine (NORVASC) 10 MG tablet Take 1 tablet (10 mg total) by mouth daily.  05/12/19  Yes Minette Brine, FNP  aspirin EC 81 MG tablet Take 81 mg by mouth every evening.   Yes [provider]  Calcium Carbonate-Vitamin D (CALCIUM-D PO) Take 2 tablets by mouth daily at 12 noon.    Yes [provider]  carvedilol (COREG) 6.25 MG tablet TAKE 1 TABLET(6.25 MG) BY MOUTH TWICE DAILY Patient taking differently: Take 6.25 mg by mouth 2 (two) times daily with a meal.  07/31/19  Yes Minette Brine, FNP  hydrALAZINE (APRESOLINE) 50 MG tablet Take 1 tablet (50 mg total) by mouth 2 (two) times daily. 06/16/19 09/14/19 Yes Elouise Munroe, MD  Insulin Degludec-Liraglutide (XULTOPHY) 100-3.6 UNIT-MG/ML SOPN Inject 30 Units into the skin daily. 08/14/19  Yes Minette Brine, FNP  telmisartan-hydrochlorothiazide (MICARDIS HCT) 40-12.5 MG tablet Take 1 tablet by mouth daily. 07/31/19  Yes Minette Brine, FNP  ticagrelor (BRILINTA) 90 MG TABS tablet Take 1 tablet (90 mg total) by mouth 2 (two) times daily. 06/12/19  Yes Elouise Munroe, MD  vitamin C (ASCORBIC ACID) 250 MG tablet Take 500 mg by mouth daily at 12 noon.   Yes [provider]  blood glucose meter kit and supplies KIT Dispense based on patient and insurance preference. Use up to four times daily as directed. (FOR ICD-9 250.00, 250.01). 05/10/18   Minette Brine, FNP  Blood Glucose Monitoring Suppl (TRUE METRIX METER) w/Device KIT 1 each by Does not apply route in the morning, at noon, in the evening, and at bedtime. 05/22/19   Minette Brine, FNP  Evolocumab (REPATHA SURECLICK) 740 MG/ML SOAJ Inject 140 mg into the skin every 14 (fourteen) days.  06/30/19   Elouise Munroe, MD  glucose blood (TRUE METRIX BLOOD GLUCOSE TEST) test strip Check blood sugar 4 times a day before meals and bedtime 05/22/19   Minette Brine, FNP  Insulin Glargine-Lixisenatide (SOLIQUA) 100-33 UNT-MCG/ML SOPN Inject 25 Units into the skin daily. Patient taking differently: Inject 30 Units into the skin daily.  07/14/19   Minette Brine, FNP   Multiple Vitamin (MULTIVITAMIN PO) Take 1 tablet by mouth daily at 12 noon.     [provider]  Omega-3 Fatty Acids (OMEGA-3 PLUS PO) Take 2 capsules by mouth daily at 12 noon.    [provider]     Vital Signs: BP 131/60   Pulse 93   Temp 99.1 F (37.3 C)   Resp (!) 23   Ht 5' 1"  (1.549 m)   Wt 77.5 kg   SpO2 96%   BMI 32.28 kg/m   Physical Exam Vitals reviewed.  Constitutional:      Comments: Moaning. Does not open eyes. Does not follow commands.  HENT:     Head: Normocephalic.  Cardiovascular:     Rate and Rhythm: Normal rate.  Pulmonary:     Effort: Pulmonary effort is normal.  Abdominal:     Palpations: Abdomen is soft.  Musculoskeletal:     Comments: Right thigh stable     Imaging: CT ABDOMEN PELVIS WO CONTRAST  Result Date: 08/26/2019 CLINICAL DATA:  Abdominal distension. EXAM: CT ABDOMEN AND PELVIS WITHOUT CONTRAST TECHNIQUE: Multidetector CT imaging of the abdomen and pelvis was performed following the standard protocol without IV contrast. COMPARISON:  None. FINDINGS: Lower chest: No acute abnormality. Hepatobiliary: No focal liver abnormality is seen. No gallstones, gallbladder wall thickening, or biliary dilatation. Pancreas: Unremarkable. No pancreatic ductal dilatation or surrounding inflammatory changes. Spleen: Normal in size without focal abnormality. Adrenals/Urinary Tract: Adrenal glands and kidneys are unremarkable. No hydronephrosis or renal obstruction is noted. Foley catheter is noted within urinary bladder. Stomach/Bowel: Stomach is within normal limits. Appendix appears normal. No evidence of bowel wall thickening, distention, or inflammatory changes. Vascular/Lymphatic: Atherosclerosis of abdominal aorta is noted without aneurysm formation. There does appear to be a mild to moderate amount of retroperitoneal hematoma in the right iliac and inguinal regions. Moderate size hematoma is noted in the soft tissues anterior to the  musculature of the right hip. No definite intraperitoneal hemorrhage is noted. Reproductive: Status post hysterectomy. No adnexal masses. Other: Moderate size fat containing periumbilical hernia is noted. No ascites is noted. Musculoskeletal: No acute or significant osseous findings. IMPRESSION: 1. Mild to moderate amount of retroperitoneal hematoma is noted in the right iliac and inguinal regions. Moderate size hematoma is noted in the soft tissues anterior to the musculature of the right hip. No definite intraperitoneal hemorrhage is noted. These results will be called to the ordering clinician or representative by the Radiologist Assistant, and communication documented in the PACS or zVision Dashboard. 2. Moderate size fat containing periumbilical hernia. Aortic Atherosclerosis (ICD10-I70.0). Electronically Signed   By: Marijo Conception M.D.   On: 08/26/2019 16:31   CT HEAD WO CONTRAST  Result Date: 08/27/2019 CLINICAL DATA:  Evaluate for source of hemorrhage EXAM: CT HEAD WITHOUT CONTRAST TECHNIQUE: Contiguous axial images were obtained from the base of the skull through the vertex without intravenous contrast. COMPARISON:  Brain MRI from yesterday FINDINGS: Brain: Small infarcts by brain MRI are largely occult on this study. Remote perforator infarct at the right basal ganglia and lateral left cerebellum. Mild cerebral volume loss.  No hemorrhage, hydrocephalus, or masslike finding. Vascular: No hyperdense vessel or unexpected calcification. Skull: Normal. Negative for fracture or focal lesion. Sinuses/Orbits: Chronic left maxillary sinusitis with complete opacification. Patchy mucosal thickening elsewhere. IMPRESSION: 1. Known acute infarcts that are underestimated by CT. No detected progression or acute hemorrhage. 2. 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 Electronically Signed   By: Monte Fantasia M.D.   On: 08/27/2019 06:40   DG CHEST PORT 1 VIEW  Result Date: 08/29/2019 CLINICAL  DATA:  Pulmonary edema. EXAM: PORTABLE CHEST 1 VIEW COMPARISON:  08/28/2019.  08/27/2019.  11/29/2018. FINDINGS: Feeding tube and right PICC line stable position. Heart size normal. Stable mild interstitial prominence. No alveolar infiltrate. No pleural effusion or pneumothorax. No acute bony abnormality. IMPRESSION: 1.  Lines and tubes in stable position. 2. Stable mild bilateral interstitial prominence. Again mild interstitial edema and/or pneumonitis cannot be excluded. Electronically Signed   By: Marcello Moores  Register   On: 08/29/2019 08:21   DG CHEST PORT 1 VIEW  Result Date: 08/28/2019 CLINICAL DATA:  Hypoxia EXAM: PORTABLE CHEST 1 VIEW COMPARISON:  Yesterday FINDINGS: Increased generalized interstitial opacity. There is also more dense hazy opacity over the upper lungs, usually scarring in this location but not seen in 2020. Right PICC with tip at the SVC. Feeding tube which reaches the stomach IMPRESSION: Increased interstitial opacity, question edema. There also appears to be airspace disease in the upper lobes, infection not excluded. Electronically Signed   By: Monte Fantasia M.D.   On: 08/28/2019 04:13   DG CHEST PORT 1 VIEW  Result Date: 08/27/2019 CLINICAL DATA:  Wheezing EXAM: PORTABLE CHEST 1 VIEW COMPARISON:  11/29/2018 FINDINGS: Esophageal tube tip is below the diaphragm but incompletely visualized. Right upper extremity central venous catheter tip over the SVC. Mild diffuse interstitial opacity with subtle nodularity. Stable cardiomediastinal silhouette with aortic atherosclerosis. No pneumothorax. IMPRESSION: Mild diffuse increased interstitial opacity with subtle nodularity, potentially due to atypical infection. Electronically Signed   By: Donavan Foil M.D.   On: 08/27/2019 19:20   ECHOCARDIOGRAM COMPLETE  Result Date: 08/27/2019    ECHOCARDIOGRAM REPORT   Patient Name:   Donna [name] Gebel Date of Exam: 08/27/2019 Medical Rec #:  [id number]       Height:       61.0 in Accession #:     [id number]      Weight:       151.0 lb Date of Birth:  1951/01/25       BSA:          1.676 m Patient Age:    71 years        BP:           117/53 mmHg Patient Gender: F               HR:           106 bpm. Exam Location:  Inpatient Procedure: 2D Echo Indications:    Stroke  History:        Patient has prior history of Echocardiogram examinations, most                 recent 10/08/2018. Risk Factors:Hypertension, Dyslipidemia and                 Diabetes.  Sonographer:    Mikki Santee RDCS (AE) Referring Phys: [id number]  1. Left ventricular ejection fraction, by estimation, is 60 to 65%. The left ventricle has normal function. The left ventricle  has no regional wall motion abnormalities. There is moderate left ventricular hypertrophy of the basal-septal segment. Left ventricular diastolic function could not be evaluated.  2. Right ventricular systolic function is normal. The right ventricular size is normal. Tricuspid regurgitation signal is inadequate for assessing PA pressure.  3. The mitral valve is normal in structure. Trivial mitral valve regurgitation. No evidence of mitral stenosis.  4. The aortic valve was not well visualized. Aortic valve regurgitation is not visualized. No aortic stenosis is present.  5. The inferior vena cava is normal in size with greater than 50% respiratory variability, suggesting right atrial pressure of 3 mmHg. FINDINGS  Left Ventricle: Left ventricular ejection fraction, by estimation, is 60 to 65%. The left ventricle has normal function. The left ventricle has no regional wall motion abnormalities. The left ventricular internal cavity size was normal in size. There is  moderate left ventricular hypertrophy of the basal-septal segment. Left ventricular diastolic function could not be evaluated. Right Ventricle: The right ventricular size is normal. No increase in right ventricular wall thickness. Right ventricular systolic function is normal. Tricuspid  regurgitation signal is inadequate for assessing PA pressure. Left Atrium: Left atrial size was normal in size. Right Atrium: Right atrial size was normal in size. Pericardium: There is no evidence of pericardial effusion. Mitral Valve: The mitral valve is normal in structure. Normal mobility of the mitral valve leaflets. Severe mitral annular calcification. Trivial mitral valve regurgitation. No evidence of mitral valve stenosis. MV peak gradient, 18.5 mmHg. The mean mitral valve gradient is 9.0 mmHg. Tricuspid Valve: The tricuspid valve is normal in structure. Tricuspid valve regurgitation is not demonstrated. No evidence of tricuspid stenosis. Aortic Valve: The aortic valve was not well visualized. . There is moderate thickening and moderate calcification of the aortic valve. Aortic valve regurgitation is not visualized. No aortic stenosis is present. There is moderate thickening of the aortic  valve. There is moderate calcification of the aortic valve. Pulmonic Valve: The pulmonic valve was normal in structure. Pulmonic valve regurgitation is not visualized. No evidence of pulmonic stenosis. Aorta: The aortic root is normal in size and structure. Venous: The inferior vena cava is normal in size with greater than 50% respiratory variability, suggesting right atrial pressure of 3 mmHg. IAS/Shunts: No atrial level shunt detected by color flow Doppler.  LEFT VENTRICLE PLAX 2D LVIDd:         4.20 cm LVIDs:         2.90 cm LV PW:         1.10 cm LV IVS:        1.30 cm LVOT diam:     2.00 cm LV SV:         45 LV SV Index:   27 LVOT Area:     3.14 cm  RIGHT VENTRICLE RV S prime:     10.90 cm/s LEFT ATRIUM             Index       RIGHT ATRIUM          Index LA diam:        3.00 cm 1.79 cm/m  RA Area:     5.38 cm LA Vol (A2C):   37.0 ml 22.07 ml/m RA Volume:   7.65 ml  4.56 ml/m LA Vol (A4C):   23.0 ml 13.72 ml/m LA Biplane Vol: 31.4 ml 18.73 ml/m  AORTIC VALVE LVOT Vmax:   90.60 cm/s LVOT Vmean:  63.800 cm/s LVOT  VTI:  0.142 m  AORTA Ao Root diam: 2.80 cm MITRAL VALVE MV Area (PHT): 4.49 cm     SHUNTS MV Peak grad:  18.5 mmHg    Systemic VTI:  0.14 m MV Mean grad:  9.0 mmHg     Systemic Diam: 2.00 cm MV Vmax:       2.15 m/s MV Vmean:      142.0 cm/s MV Decel Time: 169 msec MV E velocity: 155.00 cm/s MV A velocity: 172.00 cm/s MV E/A ratio:  0.90 Fransico Him MD Electronically signed by Fransico Him MD Signature Date/Time: 08/27/2019/12:03:05 PM    Final    [place] EKG SITE RITE  Result Date: 08/27/2019 If Site Rite image not attached, placement could not be confirmed due to current cardiac rhythm.  CT Angio Abd/Pel w/ and/or w/o  Result Date: 08/27/2019 CLINICAL DATA:  Acute blood loss anemia.  Recent arteriogram. EXAM: CTA ABDOMEN AND PELVIS WITHOUT AND WITH CONTRAST TECHNIQUE: Multidetector CT imaging of the abdomen and pelvis was performed using the standard protocol during bolus administration of intravenous contrast. Multiplanar reconstructed images and MIPs were obtained and reviewed to evaluate the vascular anatomy. CONTRAST:  139m OMNIPAQUE IOHEXOL 350 MG/ML SOLN COMPARISON:  Yesterday FINDINGS: VASCULAR Aorta: Diffuse atheromatous plaque.  Negative for aneurysm Celiac: Plaque at the origin moderate narrowing. No branch occlusion. SMA: Diffusely patent without stenosis or dissection Renals: Accessory left upper pole renal artery. There is atherosclerotic plaque without proximal high-grade stenosis. IMA: Patent Inflow: Atheromatous plaque. Proximal Outflow: No visible irregularity of common femoral or visible superficial femoral arteries. Veins: Hemorrhage tracks along the external iliac vein extends into the pelvis, overall small volume for the procedure. Review of the MIP images confirms the above findings. NON-VASCULAR Lower chest: Partially covered opacity at the right lower lobe with trace left pleural effusion. The airways appear cuffed and there are a few thickened interlobular septae a. Hepatobiliary: No  focal liver abnormality.Contrast excretion into the gallbladder. Pancreas: Unremarkable. Spleen: Unremarkable. Adrenals/Urinary Tract: Negative adrenals. No hydronephrosis or stone. Unremarkable bladder. Stomach/Bowel:  No obstruction. No appendicitis. Lymphatic: No mass or adenopathy.  Fatty umbilical hernia. Reproductive:No pathologic findings. Other: No ascites or pneumoperitoneum. Musculoskeletal: No acute abnormalities. Lower lumbar disc bulging and facet spurring. Case discussed with Dr. DScot Dock fortunately the thigh remain soft. IMPRESSION: 1. No visualized active bleeding. There is unchanged and relatively mild blood products in the right groin and retroperitoneum. There was right rectus femoris expansion and hemorrhage on the preceding abdominal CT, less well covered today. 2. Mild pulmonary edema. Electronically Signed   By: JMonte FantasiaM.D.   On: 08/27/2019 07:02    Labs:  CBC: Recent Labs    08/26/19 0732 08/26/19 2244 08/27/19 0230 08/27/19 0529 08/28/19 0500 08/28/19 0505 08/28/19 1608 08/29/19 0457  WBC 11.8*  --  11.6*  --  13.1*  --   --  10.2  HGB 7.0*   < > 4.8*   < > 5.2* 11.9* 6.8* 4.7*  HCT 20.4*   < > 15.1*   < > 16.5* 35.0* 20.0* 15.6*  PLT 223  --  182  --  177  --   --  177   < > = values in this interval not displayed.    COAGS: Recent Labs    11/27/18 0728 08/18/19 0705 08/26/19 1715 08/27/19 0230  INR 0.9 0.9 1.1 1.2    BMP: Recent Labs    08/26/19 0555 08/26/19 0555 08/27/19 0230 08/27/19 0546 08/28/19 0500 08/28/19 0505 08/28/19 1608 08/29/19  0457  NA 134*   < > 141   < > 143 144 146* 147*  K 4.5   < > 3.5   < > 3.7 3.5 3.3* 2.8*  CL 103  --  114*  --  111  --   --  112*  CO2 17*  --  19*  --  22  --   --  27  GLUCOSE 289*  --  189*  --  316*  --   --  177*  BUN 31*  --  21  --  13  --   --  14  CALCIUM 8.0*  --  7.6*  --  8.3*  --   --  8.2*  CREATININE 1.78*  --  1.12*  --  0.96  --   --  0.87  GFRNONAA 29*  --  50*  --  >60   --   --  >60  GFRAA 33*  --  58*  --  >60  --   --  >60   < > = values in this interval not displayed.    LIVER FUNCTION TESTS: Recent Labs    05/22/19 1312 08/27/19 0230 08/28/19 0500 08/29/19 0457  BILITOT 0.3 0.1* 0.9 0.8  AST 15 21 29 22   ALT 19 21 24 21   ALKPHOS 74 36* 46 42  PROT 7.5 4.9* 5.7* 5.5*  ALBUMIN 4.0 2.4* 2.7* 2.4*    Assessment and Plan:  Right ICA cervical/petrous junction stenosis.  S/p revascularization using balloon angioplasty via right radial and right femoral approach6/14/2021by Dr. Estanislado Pandy.  Mild-moderate R RP hematoma, moderate right thigh/soft tissue hematoma.   Neuro exam = less alert. Not following commands.  Profound anemia secondary to Retroperitoneal hematoma = no blood products due to religious preference.  Continue care per CCM. Continue to hold Brilinta and ASA as risk of bleeding and profound anemia outweigh benefit of antiplatelet therapy at this time.  Electronically Signed: Murrell Redden, PA-C 08/30/2019, 12:23 PM    I spent a total of 15 Minutes at the the patient's bedside AND on the patient's hospital floor or unit, greater than 50% of which was counseling/coordinating care for f/u cerebral intervention.

## 2019-08-30 NOTE — Progress Notes (Addendum)
Called [facility] and notified that patient needs a PRN medication for blood pressure. Awaiting call back/orders from [facility] MD. Will continue to monitor.

## 2019-08-30 NOTE — Progress Notes (Signed)
NAME:  Donna [name], MRN:  [id number], DOB:  [birth year], LOS: 5 ADMISSION DATE:  08/25/2019, CONSULTATION DATE:  08/27/2019 REFERRING MD:  Dr. 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  Was placed on ASA/ brillinta.  Monitored in Neuro ICU.  Noted to have developed hypotension, increased left sided weakness and lethargy on 6/15 am with Hgb drop from 13.3 to 6.9 with new AKI.  Post MRI/ MRA showed scattered small acute infarcts along the right MCA/ watershed territory suspected to be resultant either post procedure vs hypoperfusion.  Neurology was consulted with recommendations to keep SBP > 140 for cerebral perfusion.  A CT abd/ pelvis was obtained given concern for retroperitoneal bleed which showed a mild to moderate amount of retroperitoneal hematoma in the right iliac and inguinal regiones with moderate size hematoma noted the the soft tissues anterior to the musculature of the right hip; no definite intrapertioneal hemorrhage. INR, PT, and platelets rechecked and were wnl.  Vascular surgery was consulted with plans to observe given bleeding was mostly into the muscle and tissue.  She was started on neosynephrine to keep SBP goal > 140-160 for cerebral perfusion.  PCCM consulted to help with vasopressor support.  S/p 1L bolus  Repeat Hgb now 6.9-> 7-> 5.1.   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     Significant Diagnostic Tests:   6/15 MRA/ MRI brain  >> 1. Scattered small acute infarcts along the right MCA/watershed territory. Solitary small acute infarct in the left parietal cortex and right cerebellum. 2. Improved right ICA patency at the petrous segment. 3. Known severe right M1 segment stenosis. 4. 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  6/15 CT a/p wo contrast >> 1. Mild to moderate amount of retroperitoneal hematoma is noted in the right iliac and inguinal regions. Moderate size hematoma is noted in the soft tissues anterior to the musculature of the right hip. No definite intraperitoneal hemorrhage is noted. 2. Moderate size fat containing periumbilical hernia. Aortic Atherosclerosis    Micro Data:  6/10 SARS 2 >> neg 6/14 MRSA PCR >> neg  Antimicrobials:  6/14 cefazolin pre-op   Interim history/subjective:   Afebrile No respiratory distress  Objective   Blood pressure (!) 131/55, pulse (!) 101, temperature 99.8 F (37.7 C), temperature source Axillary, resp. rate (!) 21, height 5\' 1"  (1.549 m), weight 77.5 kg, SpO2 96 %.        Intake/Output Summary (Last 24 hours) at 08/30/2019 1136 Last data filed at 08/30/2019 1100 Gross per 24 hour  Intake 3838.23 ml  Output 1100 ml  Net 2738.23 ml   Filed Weights   08/25/19 0705 08/30/19 0500  Weight: 68.5 kg 77.5 kg    Examination:  Obese woman No JVD, no  accessory muscle use Bilateral breath sounds clear, no rhonchi S1-S2 normal Soft nontender abdomen Neuro -right gaze preference, purposeful on left arm but flaccid left lower extremity, moves right side well Hematoma right groin and on back appears to be improving   Chest x-ray 6/18  shows interstitial prominence. Labs from 6/18 show mild hyponatremia and hypokalemia, no leukocytosis, slight drop in hemoglobin to 4.7 from 5.2   Resolved Hospital Problem list   Hypotension   Assessment & Plan:   Acute encephalopathy - -in setting of CNS depressing medications, R MCA CVA  P -minimize CNS depressing medications as able -supportive care, CVA support per neurology   s/p RT ICA cer/petrous junction balloon angioplasty for severe stenosis. Post-procedure with residual stenosis of approx 25 %. Severe stenosis of RT MCA M1 seg with improved flow post angioplasty Right MCA watershed territory infarct Small left frontal cortex and right cerebellum    P:  - Per Neuro IR and Neurology -PT eval   Acute respiratory failure with hypoxia - -CXR with pulm edema, mild BUL opacities  -Mild upper airway pseudowheeze 6/18, diuresed and given Solu-Medrol for stridor  P -Supplemental O2 as needed for SpO2 goal > 92% -Duonebs -Pulm hygiene    Acute blood loss anemia - Jehovah's Witness: no blood products including albumin  - CTA Abd/Pelvis 08/26/19 with hematoma of the right groin and retroperitoneum with expansion to the right rectus femoris expansion. No active bleeding  P -Getting labs every other day -Hold ASA/Brillinta -aranesp half dosage weekly (first dose August 27, 2019] -  feraheme every 72 hours [first dose August 27, 2019] x2 doses   AKI -resolved   DM, type II - uncontrolled P:  - SSI + Lantus + EN coverage   Hypokalemia and hypophosphatemia P: -Replete potassium and phosphorus  Best practice:  Diet: NPO  Pain/Anxiety/Delirium protocol (if indicated): n/a VAP protocol (if indicated): n/a DVT prophylaxis: SCDs only in setting of acute anemia/blood loss GI prophylaxis: Pepcid Glucose control: CBG q 4 Mobility: BR Code Status: Full   Family Communication: - son, [name] at [phone] -> updated of above and risk for intubation  Disposition:  ICU  [facility] following, PCCM will see again Monday but available as needed  Kara Mead MD. [facility]. [facility]  If no response to pager , please call [phone]   08/30/2019

## 2019-08-30 NOTE — Progress Notes (Signed)
STROKE TEAM PROGRESS NOTE   INTERVAL HISTORY  She is a little drowsy today per the nurse but overall seems about the same.   Vitals:   08/30/19 0200 08/30/19 0300 08/30/19 0400 08/30/19 0500  BP: (!) 134/55 (!) 118/48 (!) 130/56 (!) 118/48  Pulse: 81 71 79 73  Resp: 19 (!) 35 (!) 34 17  Temp:   99.5 F (37.5 C)   TempSrc:   Axillary   SpO2: 100% 100% 100% 100%  Weight:    77.5 kg  Height:       CBC:  Recent Labs  Lab 08/26/19 0555 08/26/19 0555 08/26/19 0732 08/26/19 2244 08/28/19 0500 08/28/19 0505 08/28/19 1608 08/29/19 0457  WBC 11.4*   < > 11.8*   < > 13.1*  --   --  10.2  NEUTROABS 8.9*  --  8.9*  --   --   --   --   --   HGB 6.9*   < > 7.0*   < > 5.2*   < > 6.8* 4.7*  HCT 20.4*   < > 20.4*   < > 16.5*   < > 20.0* 15.6*  MCV 91.1   < > 89.9   < > 93.2  --   --  98.1  PLT 206   < > 223   < > 177  --   --  177   < > = values in this interval not displayed.   Basic Metabolic Panel:  Recent Labs  Lab 08/28/19 0500 08/28/19 0505 08/28/19 1608 08/29/19 0457  NA 143   < > 146* 147*  K 3.7   < > 3.3* 2.8*  CL 111  --   --  112*  CO2 22  --   --  27  GLUCOSE 316*  --   --  177*  BUN 13  --   --  14  CREATININE 0.96  --   --  0.87  CALCIUM 8.3*  --   --  8.2*  MG 1.9  --   --  2.0  PHOS  --   --   --  1.7*   < > = values in this interval not displayed.   Lipid Panel:     Component Value Date/Time   CHOL 217 (H) 05/22/2019 1312   TRIG 114 05/22/2019 1312   HDL 65 05/22/2019 1312   CHOLHDL 3.3 05/22/2019 1312   LDLCALC 132 (H) 05/22/2019 1312   HgbA1c:  Lab Results  Component Value Date   HGBA1C 11.2 (H) 05/22/2019   Urine Drug Screen: No results found for: LABOPIA, COCAINSCRNUR, LABBENZ, AMPHETMU, THCU, LABBARB  Alcohol Level No results found for: ETH  IMAGING past 24 hours No results found.  PHYSICAL EXAM   Temp:  [99 F (37.2 C)-100.5 F (38.1 C)] 99.5 F (37.5 C) (06/19 0400) Pulse Rate:  [70-117] 73 (06/19 0500) Resp:  [16-40] 17 (06/19  0500) BP: (114-169)/(46-78) 118/48 (06/19 0500) SpO2:  [91 %-100 %] 100 % (06/19 0500) Weight:  [77.5 kg] 77.5 kg (06/19 0500)  General - Well nourished, well developed, lethargic but more awake alert  Ophthalmologic - fundi not visualized due to noncooperation.  Cardiovascular - Regular rhythm and rate.  Neuro - lethargic but more awake alert than before, eyes spontaneously open, orientated to age and month but not able to name the hospital. Following simple commands on the right hand and foot. With forced eye opening, right gaze preference, not cross midline. Left neglect, not  blinking to visual threat on the left. PERRL. Left facial droop. Left UE 0/5 and LLE 0/5 with pain stimulation. RUE 3/5 at least and RLE 2/5 with pain. Sensation, coordination not cooperative and gait not tested.   ASSESSMENT/PLAN [name] is a 69 y.o. 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 MRI with scattered R MCA watershed infarcts, L frontal cortex and R cerebellar infarcts.   Intracranial Stenoses s/p angioplasty History of stroke  06/2018 R lentiform nucleus, external and extreme capsule infarct. mild periventricular small vessel disease. multiple uncontrolled risk factors. LDL 131 and A1C 11.4. OP w/u. Put on plavix but non-compliant. Intolerant to statins.   Found to have severe intracranial stenoses on MRA - referred to Dr. Corliss [name].    10/2018 cerebral angio - 85% stenosis of the mid M1 segment of the right MCA. Severe high-grade approximately 90% stenosis of left VBJ, and of 60% just distal to the origin of the AICA. Approximately 50% stenosis at the origin of the right ICA at the bulb, and also about  60% of the right ICA at the cervical petrous junction.   Plan for 11/2018 procedure but P2Y12 was high, plavix changed to brilinta and procedure postponed.   08/25/2019 (Dr. Corliss [name]) S/p R cervical ICA/Petrous jxn angioplasty 08/25/19 ->25% residual stenosis. Severe stenosis of RT MCA M 1 seg with improved flow post angioplasty.    post-procedure worsening L sided weakness and neglect  MRI with scattered R MCA watershed infarcts, L frontal cortex and R cerebellar infarcts.   Severe Acute Blood Loss Anemia - Hematoma R Thigh  CT abd/pelvis 6/15 large R groin retroperitoneal hematoma iliac & inguinal mostly in muscle and soft tissue  Hgb 13.3->6.9->7->5.1->4.8->5.0->5.2->4.7 - pending  P2Y12 164  On Aranesp / Feraheme   VVS consult (Dr Donna [name]) - no procedure needed  Pt is a Jehovah's Witness - refuses all blood products including albumin, son confirmed  Aspirin and brilinta now on hold  Not safe for wound exploration d/t risk of additional blood loss  Hgb drop with dilutional component d/t increased fluids to maintain stent patency - now getting NS at 25 cc / hr ; Jevity 50 cc / hr  Femstop considered, not felt to be actively bleeding  Repeat CT abd/pelvis 6/16 no active bleeding  Treated with IV iron/erythropoietin following consult w/ [facility]  Limit blood draws where possible   Stroke:   R MCA scattered watershed, L frontal and R cerebellar infarcts following R ICA/P2 jxn angioplasty   MRI  Scattered R MCA/watershed infarcts. L parietal cortex infarct. R cerebellar infarct.  MRA   Improved R ICA patency. Severe R M1 stenosis. High-grade L V4 and moderate mid BA stenosis.   CT head No progression of infarcts or hemorrhage  2D echo EF 60-65%. No source of embolus  aspirin 81 mg daily and Brilinta (ticagrelor) 90 mg bid prior to admission, continue hold given hmg   Therapy recommendations:  not medically ready for therapy  Disposition:  pending    Acute Respiratory distress   Increasing O2 demand  Received one dose of lasix 6/16  On low dose precedex -> off now   CCM on board  Hypotension  BP goal 120-160  PICC line placed  Treated w/ Max neo ->  now off  BP now ~120 (SBP 118->134)  CCM onboard   . Long-term BP goal 130-150 given intracranial stenoses  Hyperlipidemia  Home meds:  Repatha & fish oil  Intolerant to statins  LDL 132 in March, goal < 70  Continue Repatha at discharge  Diabetes type II Uncontrolled  HgbA1c 11.2 in March, goal < 7.0  CBGs  SSI  Dysphagia . Secondary to stroke . NPO . Cortrak TF @ 50 & IVF @ 25 . Speech on board   Hypokalemia  Potassium - 4.5->3.5->3.4->3.7->3.5->3.3 ->2.8->  Pt had total of 160 meq KCL (Cortrak) yesterday plus 30 mmol potassium phosphate  Lasix 20 mg IV yesterday   Hypophosphatemia - 1.7  Limit blood draws where possible due to anemia  Fever  Tmax - 110.9->101.7->99.5  Leukocytosis 11.8->11.4->11.6->13.1->10.2   Currently no antibiotics  Solumedrol 60 mg IV x 1 on 08/29/19   Blood cultures - pending   CXR 08/29/19 - Stable mild bilateral interstitial prominence. Again mild interstitial edema and/or pneumonitis cannot be excluded.  Check U/A  Limit blood draws where possible due to anemia  Other Stroke Risk Factors  Advanced age  Overweight, Body mass index is 32.28 kg/m., recommend weight loss, diet and exercise as appropriate   Hx stroke/TIA (see above)  Family hx stroke (mother)  Snoring. OP sleep eval showed mild Obstructive sleep apnea overall but severe apnea during REM w/ CPAP recommended   Other Active Problems  Hx noncompliance  Metabolic enceaphalopath  AKI pre-renal in setting of anemia 0.93->1.78->1.12->0.96->0.87   Hx palpitations on coreg, event monitor ordered 09/2018 but never completed   Code status - full code   Hospital day # 5  This patient is critically ill due to stroke post procedure,  severe anemia, hematoma on the thigh, hypotension and at significant risk of neurological worsening, death form severe anemia, bleeding, recurrent stroke, intracranial stenosis. This patient's care requires constant monitoring of vital signs, hemodynamics, respiratory and cardiac monitoring, review of multiple databases, neurological assessment, other specialists and medical decision making of high complexity. I spent69minutes of neurocritical care time in the care of this patient     To contact Stroke Continuity provider, please refer to [email or web address]. After hours, contact General Neurology

## 2019-08-30 NOTE — Progress Notes (Signed)
PROGRESS NOTE  LAELLE BRIDGETT HQI:[id number] DOB: 08-08-1950 DOA: 08/25/2019 PCP: Arnette Felts, FNP   LOS: 5 days   Brief narrative:  Donna [name] is an 69 y.o. female with history of  CVA (2020); OSA; DM type II; essential HTN; and hyperlipidemia who was admitted by interventional radiology on 6/14 status post R ICA angioplasty/stent placement.  There was report of acute on chronic left-sided weakness with altered mentation with significant weakness on the left side.  Hospitalist team was consulted for further evaluation  Assessment/Plan:   Principal Problem:   Internal carotid artery stenosis, right Active Problems:   Essential hypertension   Hyperlipidemia   Cerebral infarction (HCC)   Type 2 diabetes mellitus with vascular disease (HCC)   AKI (acute kidney injury) (HCC)   Anemia   Acute respiratory failure with hypoxia (HCC)  Right ICA stenosis -status post revascularization of Right  ICA stenosis via angioplasty. Vascular surgery on board.   Acute hypoxic respiratory failure.  Patient received IV Lasix, DuoNeb's.  Chest x-ray from 08/28/2019 showed increased interstitial opacity, question edema/airspace disease in the upper lobes infection not excluded.  Chest x-ray from 08/29/2019 showed stable bilateral interstitial prominence .  On 2 L of nasal cannula oxygen today.  PCCM on board.    Fever.  Temperature max of 100.5 F   There is high risk of aspiration secondary to decreased mentation.  Blood cultures ordered with pending likely secondary to blood conservation efforts.  PCCM on board  Hypotension requiring intermittent pressors.    Metabolic encephalopathy.  Likely multifactorial from hypoperfusion, severe anemia, CvA watershed infarct, left parietal and cerebellar infarct.  Is more alert awake and communicative today.  Acute CVA Patient with prior h/o embolic CVA with left-sided deficits. Despite right ICA angioplasty, weakness progressed with altered mental status.  MRI/MRA confirmed a small solitary infract in Left parietal cortex and right cerebellum as well as scattered small acute infarcts along the R MCA/watershed territory.  Neurology on board.  Continue physical therapy, occupational therapy as able.  Antiplatelets currently on hold.  CT angiogram of the abdomen and pelvis with right groin and retroperitoneal hematoma.  Neurology on board.  Acute blood loss anemia  Likely secondary to mild to moderate groin and retroperitoneal hematoma.  Hemoglobin of 4.7 today.  Patient is Jehovah's Witness.  Received erythropoietin and iron infusion .  Pharmacy on board.  No blood work done today.  Hypophosphatemia.  Replace with potassium phosphate, check levels in a.m.  Hypokalemia.  Potassium of 2.8 on 08/29/2019.  Replenished with potassium phosphate yesterday.  Acute kidney injury. Improved.  Latest creatinine of 0.9.  Likely secondary to hypoperfusion.  Essential HTN Currently not on any antihypertensives.  Hyperlipidemia -Patient is on repatha for statin-intolerant (myopathy) as outpatient.  DM type II A1c was 11.2 on 3/11. On [place] as an outpatient,on hold. continue on SSI, basal Lantus 10 units .  Will closely monitor blood glucose levels.  Latest POC glucose of 149  Leukocytosis trending down.  Off antibiotic.  Will need to closely monitor.  Prognosis.  Prognosis of the patient is guarded due to severe anemia, cerebral multiple infarcts, encephalopathy, poor nutritional status   VTE Prophylaxis: Sequential compression device  Code Status: Full code  Family Communication: None today.    Consultants:  Neurology  Critical care  Procedures:  Right ICA angioplasty  Antibiotics:  . none  Anti-infectives (From admission, onward)   Start     Dose/Rate Route Frequency Ordered Stop  08/25/19 0723  ceFAZolin (ANCEF) IVPB 2g/100 mL premix        2 g 200 mL/hr over 30 Minutes Intravenous 60 min pre-op 08/25/19 0723  08/25/19 0930     Subjective: Today, patient was seen and examined at bedside.  Appears to be little more lethargic this morning.  Not much could be obtained from the patient Objective: Vitals:   08/30/19 0600 08/30/19 0700  BP: 127/82 131/61  Pulse: (!) 113 76  Resp: (!) 25 18  Temp:    SpO2: 100% 100%    Intake/Output Summary (Last 24 hours) at 08/30/2019 0815 Last data filed at 08/30/2019 0600 Gross per 24 hour  Intake 1800.15 ml  Output 1750 ml  Net 50.15 ml   Filed Weights   08/25/19 0705 08/30/19 0500  Weight: 68.5 kg 77.5 kg   Body mass index is 32.28 kg/m.   Physical Exam:  GENERAL: Patient is lethargic today. On nasal cannula oxygen.  Appears chronically ill HENT: Pallor noted.  Oral mucosa is moist. cortrak tube in place. NECK: is supple, no gross swelling noted. CHEST: Diminished breath sounds bilaterally.  Coarse breath sounds noted. CVS: S1 and S2 heard, no murmur. Regular rate and rhythm.  ABDOMEN: Soft, non-tender, bowel sounds are present. EXTREMITIES: Left hemiplegia.   Right thigh with marking.    Right upper extremity PICC line in place.  Right upper extremity mittens in place. CNS: Left-sided hemiparesis.  SKIN: warm and dry  Data Review: I have personally reviewed the following laboratory data and studies,  CBC: Recent Labs  Lab 08/25/19 0715 08/25/19 0715 08/26/19 0555 08/26/19 0555 08/26/19 0732 08/26/19 2244 08/27/19 0230 08/27/19 0529 08/27/19 0546 08/28/19 0500 08/28/19 0505 08/28/19 1608 08/29/19 0457  WBC 4.8   < > 11.4*  --  11.8*  --  11.6*  --   --  13.1*  --   --  10.2  NEUTROABS 2.4  --  8.9*  --  8.9*  --   --   --   --   --   --   --   --   HGB 13.3   < > 6.9*   < > 7.0*   < > 4.8*   < > 6.1* 5.2* 11.9* 6.8* 4.7*  HCT 42.2   < > 20.4*   < > 20.4*   < > 15.1*   < > 18.0* 16.5* 35.0* 20.0* 15.6*  MCV 90.2   < > 91.1  --  89.9  --  91.5  --   --  93.2  --   --  98.1  PLT 283   < > 206  --  223  --  182  --   --  177  --    --  177   < > = values in this interval not displayed.   Basic Metabolic Panel: Recent Labs  Lab 08/26/19 0555 08/26/19 0555 08/27/19 0230 08/27/19 0230 08/27/19 0546 08/28/19 0500 08/28/19 0505 08/28/19 1608 08/29/19 0457  NA 134*   < > 141   < > 144 143 144 146* 147*  K 4.5   < > 3.5   < > 3.4* 3.7 3.5 3.3* 2.8*  CL 103  --  114*  --   --  111  --   --  112*  CO2 17*  --  19*  --   --  22  --   --  27  GLUCOSE 289*  --  189*  --   --  316*  --   --  177*  BUN 31*  --  21  --   --  13  --   --  14  CREATININE 1.78*  --  1.12*  --   --  0.96  --   --  0.87  CALCIUM 8.0*  --  7.6*  --   --  8.3*  --   --  8.2*  MG  --   --   --   --   --  1.9  --   --  2.0  PHOS  --   --   --   --   --   --   --   --  1.7*   < > = values in this interval not displayed.   Liver Function Tests: Recent Labs  Lab 08/27/19 0230 08/28/19 0500 08/29/19 0457  AST 21 29 22   ALT 21 24 21   ALKPHOS 36* 46 42  BILITOT 0.1* 0.9 0.8  PROT 4.9* 5.7* 5.5*  ALBUMIN 2.4* 2.7* 2.4*   No results for input(s): LIPASE, AMYLASE in the last 168 hours. No results for input(s): AMMONIA in the last 168 hours. Cardiac Enzymes: No results for input(s): CKTOTAL, CKMB, CKMBINDEX, TROPONINI in the last 168 hours. BNP (last 3 results) No results for input(s): BNP in the last 8760 hours.  ProBNP (last 3 results) No results for input(s): PROBNP in the last 8760 hours.  CBG: Recent Labs  Lab 08/29/19 1528 08/29/19 1932 08/29/19 2311 08/30/19 0311 08/30/19 0724  GLUCAP 307* 281* 192* 129* 149*   Recent Results (from the past 240 hour(s))  SARS CORONAVIRUS 2 (TAT 6-24 HRS) Nasopharyngeal Nasopharyngeal Swab     Status: None   Collection Time: 08/21/19 12:45 PM   Specimen: Nasopharyngeal Swab  Result Value Ref Range Status   SARS Coronavirus 2 NEGATIVE NEGATIVE Final    Comment: (NOTE) SARS-CoV-2 target nucleic acids are NOT DETECTED.  The SARS-CoV-2 RNA is generally detectable in upper and  lower respiratory specimens during the acute phase of infection. Negative results do not preclude SARS-CoV-2 infection, do not rule out co-infections with other pathogens, and should not be used as the sole basis for treatment or other patient management decisions. Negative results must be combined with clinical observations, patient history, and epidemiological information. The expected result is Negative.  Fact Sheet for Patients: SugarRoll.be  Fact Sheet for Healthcare Providers: https://www.woods-mathews.com/  This test is not yet approved or cleared by the [place] FDA and  has been authorized for detection and/or diagnosis of SARS-CoV-2 by FDA under an Emergency Use Authorization (EUA). This EUA will remain  in effect (meaning this test can be used) for the duration of the COVID-19 declaration under Se ction 564(b)(1) of the Act, 21 U.S.C. section 360bbb-3(b)(1), unless the authorization is terminated or revoked sooner.  Performed at [facility], [place] [address], [place], [place] [address]   MRSA PCR Screening     Status: None   Collection Time: 08/25/19  3:42 PM   Specimen: Nasopharyngeal  Result Value Ref Range Status   MRSA by PCR NEGATIVE NEGATIVE Final    Comment:        The GeneXpert MRSA Assay (FDA approved for NASAL specimens only), is one component of a comprehensive MRSA colonization surveillance program. It is not intended to diagnose MRSA infection nor to guide or monitor treatment for MRSA infections. Performed at [facility], [place] [address] [place], [place] [address]  Studies: DG CHEST PORT 1 VIEW  Result Date: 08/29/2019 CLINICAL DATA:  Pulmonary edema. EXAM: PORTABLE CHEST 1 VIEW COMPARISON:  08/28/2019.  08/27/2019.  11/29/2018. FINDINGS: Feeding tube and right PICC line stable position. Heart size normal. Stable mild interstitial prominence. No alveolar infiltrate. No pleural  effusion or pneumothorax. No acute bony abnormality. IMPRESSION: 1.  Lines and tubes in stable position. 2. Stable mild bilateral interstitial prominence. Again mild interstitial edema and/or pneumonitis cannot be excluded. Electronically Signed   By: Maisie Fus  Register   On: 08/29/2019 08:21      Joycelyn Das, MD  [facility] 08/30/2019

## 2019-08-30 NOTE — Progress Notes (Signed)
PT Cancellation Note  Patient Details Name: Donna [name] MRN: [id number] DOB: [birth year]   Cancelled Treatment:    Reason Eval/Treat Not Completed: Active bedrest order;Medical issues which prohibited therapy. Pt with most recent activity order of bedrest. Pt also with critical Hgb value of 4.7 at last blood draw. PT will hold until bedrest orders are removed and pt is more medically stable.   Arlyss Gandy 08/30/2019, 3:20 PM

## 2019-08-30 NOTE — Progress Notes (Addendum)
eLink Physician-Brief Progress Note Patient Name: Donna [name] DOB: [birth year] MRN: [id number]   Date of Service  08/30/2019  HPI/Events of Note  Notified of hypertension requesting PRN meds. On camera assessment patient in respiratory distress and with decreased level of consciousness.  eICU Interventions  Dr Coralie Common at bedside preparing for intubation     Intervention Category Major Interventions: Hypertension - evaluation and management  Darl Pikes 08/30/2019, 11:43 PM

## 2019-08-31 ENCOUNTER — Inpatient Hospital Stay (HOSPITAL_COMMUNITY): Payer: Medicare HMO

## 2019-08-31 DIAGNOSIS — R4182 Altered mental status, unspecified: Secondary | ICD-10-CM

## 2019-08-31 DIAGNOSIS — I6350 Cerebral infarction due to unspecified occlusion or stenosis of unspecified cerebral artery: Secondary | ICD-10-CM

## 2019-08-31 LAB — POCT I-STAT 7, (LYTES, BLD GAS, ICA,H+H)
Acid-Base Excess: 6 mmol/L — ABNORMAL HIGH (ref 0.0–2.0)
Bicarbonate: 32.2 mmol/L — ABNORMAL HIGH (ref 20.0–28.0)
Calcium, Ion: 1.27 mmol/L (ref 1.15–1.40)
HCT: 15 % — ABNORMAL LOW (ref 36.0–46.0)
Hemoglobin: 5.1 g/dL — CL (ref 12.0–15.0)
O2 Saturation: 100 %
Patient temperature: 99.1
Potassium: 4.6 mmol/L (ref 3.5–5.1)
Sodium: 150 mmol/L — ABNORMAL HIGH (ref 135–145)
TCO2: 34 mmol/L — ABNORMAL HIGH (ref 22–32)
pCO2 arterial: 60.8 mmHg — ABNORMAL HIGH (ref 32.0–48.0)
pH, Arterial: 7.333 — ABNORMAL LOW (ref 7.350–7.450)
pO2, Arterial: 295 mmHg — ABNORMAL HIGH (ref 83.0–108.0)

## 2019-08-31 LAB — BASIC METABOLIC PANEL
Anion gap: 7 (ref 5–15)
BUN: 33 mg/dL — ABNORMAL HIGH (ref 8–23)
CO2: 28 mmol/L (ref 22–32)
Calcium: 8.5 mg/dL — ABNORMAL LOW (ref 8.9–10.3)
Chloride: 113 mmol/L — ABNORMAL HIGH (ref 98–111)
Creatinine, Ser: 0.93 mg/dL (ref 0.44–1.00)
GFR calc Af Amer: >60 mL/min (ref >60)
GFR calc non Af Amer: >60 mL/min (ref >60)
Glucose, Bld: 326 mg/dL — ABNORMAL HIGH (ref 70–99)
Potassium: 4.5 mmol/L (ref 3.5–5.1)
Sodium: 148 mmol/L — ABNORMAL HIGH (ref 135–145)

## 2019-08-31 LAB — GLUCOSE, CAPILLARY
Glucose-Capillary: 111 mg/dL — ABNORMAL HIGH (ref 70–99)
Glucose-Capillary: 208 mg/dL — ABNORMAL HIGH (ref 70–99)
Glucose-Capillary: 217 mg/dL — ABNORMAL HIGH (ref 70–99)
Glucose-Capillary: 234 mg/dL — ABNORMAL HIGH (ref 70–99)
Glucose-Capillary: 240 mg/dL — ABNORMAL HIGH (ref 70–99)
Glucose-Capillary: 261 mg/dL — ABNORMAL HIGH (ref 70–99)

## 2019-08-31 LAB — CBC
HCT: 16.8 % — ABNORMAL LOW (ref 36.0–46.0)
Hemoglobin: 5 g/dL — CL (ref 12.0–15.0)
MCH: 30.9 pg (ref 26.0–34.0)
MCHC: 29.8 g/dL — ABNORMAL LOW (ref 30.0–36.0)
MCV: 103.7 fL — ABNORMAL HIGH (ref 80.0–100.0)
Platelets: 246 10*3/uL (ref 150–400)
RBC: 1.62 MIL/uL — ABNORMAL LOW (ref 3.87–5.11)
RDW: 15.1 % (ref 11.5–15.5)
WBC: 18.4 10*3/uL — ABNORMAL HIGH (ref 4.0–10.5)
nRBC: 7.8 % — ABNORMAL HIGH (ref 0.0–0.2)

## 2019-08-31 LAB — MAGNESIUM: Magnesium: 2.4 mg/dL (ref 1.7–2.4)

## 2019-08-31 LAB — PHOSPHORUS: Phosphorus: 4 mg/dL (ref 2.5–4.6)

## 2019-08-31 LAB — BRAIN NATRIURETIC PEPTIDE: B Natriuretic Peptide: 800 pg/mL — ABNORMAL HIGH (ref 0.0–100.0)

## 2019-08-31 MED ORDER — FUROSEMIDE 10 MG/ML IJ SOLN
40.0000 mg | Freq: Once | INTRAMUSCULAR | Status: AC
  Administered 2019-08-31: 40 mg via INTRAVENOUS
  Filled 2019-08-31: qty 4

## 2019-08-31 MED ORDER — SODIUM CHLORIDE 0.9 % IV SOLN
3.0000 g | Freq: Four times a day (QID) | INTRAVENOUS | Status: DC
  Administered 2019-08-31 – 2019-09-02 (×8): 3 g via INTRAVENOUS
  Filled 2019-08-31 (×5): qty 3
  Filled 2019-08-31: qty 8
  Filled 2019-08-31 (×3): qty 3
  Filled 2019-08-31: qty 8

## 2019-08-31 MED ORDER — INSULIN ASPART 100 UNIT/ML ~~LOC~~ SOLN
5.0000 [IU] | SUBCUTANEOUS | Status: DC
  Administered 2019-08-31 – 2019-09-03 (×17): 5 [IU] via SUBCUTANEOUS

## 2019-08-31 MED ORDER — DEXMEDETOMIDINE HCL IN NACL 400 MCG/100ML IV SOLN
0.4000 ug/kg/h | INTRAVENOUS | Status: DC
  Administered 2019-08-31 – 2019-09-01 (×2): 0.4 ug/kg/h via INTRAVENOUS
  Administered 2019-09-01: 0.7 ug/kg/h via INTRAVENOUS
  Administered 2019-09-02 (×2): 0.6 ug/kg/h via INTRAVENOUS
  Administered 2019-09-02: 0.7 ug/kg/h via INTRAVENOUS
  Administered 2019-09-03: 0.6 ug/kg/h via INTRAVENOUS
  Administered 2019-09-04: 0.8 ug/kg/h via INTRAVENOUS
  Administered 2019-09-04: 0.7 ug/kg/h via INTRAVENOUS
  Administered 2019-09-04: 0.8 ug/kg/h via INTRAVENOUS
  Administered 2019-09-04 – 2019-09-05 (×4): 0.7 ug/kg/h via INTRAVENOUS
  Administered 2019-09-06: 0.6 ug/kg/h via INTRAVENOUS
  Administered 2019-09-06 – 2019-09-08 (×6): 0.7 ug/kg/h via INTRAVENOUS
  Administered 2019-09-08 – 2019-09-09 (×3): 0.6 ug/kg/h via INTRAVENOUS
  Administered 2019-09-09: 0.8 ug/kg/h via INTRAVENOUS
  Administered 2019-09-10 – 2019-09-11 (×2): 0.5 ug/kg/h via INTRAVENOUS
  Administered 2019-09-11: 0.8 ug/kg/h via INTRAVENOUS
  Administered 2019-09-12: 0.2 ug/kg/h via INTRAVENOUS
  Administered 2019-09-12: 0.8 ug/kg/h via INTRAVENOUS
  Administered 2019-09-13: 1.2 ug/kg/h via INTRAVENOUS
  Administered 2019-09-13 (×2): 0.6 ug/kg/h via INTRAVENOUS
  Administered 2019-09-13: 0.4 ug/kg/h via INTRAVENOUS
  Administered 2019-09-14 (×2): 0.6 ug/kg/h via INTRAVENOUS
  Administered 2019-09-14: 1 ug/kg/h via INTRAVENOUS
  Administered 2019-09-15: 0.5 ug/kg/h via INTRAVENOUS
  Administered 2019-09-15: 1 ug/kg/h via INTRAVENOUS
  Administered 2019-09-15 – 2019-09-16 (×2): 0.5 ug/kg/h via INTRAVENOUS
  Administered 2019-09-16: 0.3 ug/kg/h via INTRAVENOUS
  Administered 2019-09-17: 0.6 ug/kg/h via INTRAVENOUS
  Administered 2019-09-17: 0.4 ug/kg/h via INTRAVENOUS
  Administered 2019-09-18 – 2019-09-19 (×2): 0.6 ug/kg/h via INTRAVENOUS
  Administered 2019-09-19 (×2): 0.5 ug/kg/h via INTRAVENOUS
  Administered 2019-09-20: 0.4 ug/kg/h via INTRAVENOUS
  Administered 2019-09-21: 0.5 ug/kg/h via INTRAVENOUS
  Administered 2019-09-22 – 2019-09-23 (×2): 0.4 ug/kg/h via INTRAVENOUS
  Filled 2019-08-31 (×11): qty 100
  Filled 2019-08-31 (×2): qty 200
  Filled 2019-08-31 (×2): qty 100
  Filled 2019-08-31: qty 200
  Filled 2019-08-31 (×4): qty 100
  Filled 2019-08-31: qty 200
  Filled 2019-08-31 (×5): qty 100
  Filled 2019-08-31: qty 200
  Filled 2019-08-31 (×6): qty 100
  Filled 2019-08-31: qty 200
  Filled 2019-08-31 (×2): qty 100
  Filled 2019-08-31: qty 200
  Filled 2019-08-31 (×16): qty 100

## 2019-08-31 MED ORDER — ORAL CARE MOUTH RINSE
15.0000 mL | OROMUCOSAL | Status: DC
  Administered 2019-08-31 – 2019-09-03 (×35): 15 mL via OROMUCOSAL

## 2019-08-31 MED ORDER — FAMOTIDINE 20 MG PO TABS
20.0000 mg | ORAL_TABLET | Freq: Every day | ORAL | Status: DC
  Administered 2019-08-31 – 2019-09-11 (×11): 20 mg
  Filled 2019-08-31 (×10): qty 1

## 2019-08-31 MED ORDER — FENTANYL CITRATE (PF) 100 MCG/2ML IJ SOLN
25.0000 ug | INTRAMUSCULAR | Status: DC | PRN
  Administered 2019-09-04 – 2019-09-13 (×10): 100 ug via INTRAVENOUS
  Administered 2019-09-16: 50 ug via INTRAVENOUS
  Administered 2019-09-17: 100 ug via INTRAVENOUS
  Administered 2019-09-18 (×2): 50 ug via INTRAVENOUS
  Administered 2019-09-19 – 2019-09-20 (×3): 100 ug via INTRAVENOUS
  Administered 2019-09-21: 50 ug via INTRAVENOUS
  Administered 2019-09-21 – 2019-10-12 (×8): 100 ug via INTRAVENOUS
  Filled 2019-08-31 (×26): qty 2

## 2019-08-31 MED ORDER — LABETALOL HCL 5 MG/ML IV SOLN
10.0000 mg | INTRAVENOUS | Status: DC | PRN
  Administered 2019-09-03 – 2019-10-08 (×35): 10 mg via INTRAVENOUS
  Filled 2019-08-31 (×32): qty 4

## 2019-08-31 MED ORDER — INSULIN GLARGINE 100 UNIT/ML ~~LOC~~ SOLN
15.0000 [IU] | Freq: Every day | SUBCUTANEOUS | Status: DC
  Administered 2019-09-01 – 2019-09-02 (×2): 15 [IU] via SUBCUTANEOUS
  Filled 2019-08-31 (×3): qty 0.15

## 2019-08-31 MED ORDER — FENTANYL 2500MCG IN NS 250ML (10MCG/ML) PREMIX INFUSION: 25.0000 ug/h | INTRAVENOUS | Status: DC

## 2019-08-31 MED FILL — Medication: Qty: 1 | Status: AC

## 2019-08-31 NOTE — Progress Notes (Signed)
Pharmacy Antibiotic Note  Donna [name] is a 69 y.o. female admitted on 08/25/2019 for cerebral angiogram s/p RT ICA cervical/ petrous junction balloon angioplasty for severe stenosis via right femoral approach. Developled hypotension, acute blood loss anemia and L-sided weakness 6/15 AM found to have scattered small acute infarcts along the R-MCA watershed territory. Patient required reintubation for acute respiratory distress 6/19 PM and CXR shows bilateral opacities concerning for aspiration versus edema. Patient's WBC are increasing at 18.4, Tmax 100.1 over last 24 hrs.  Pharmacy has been consulted for Unasyn dosing.    Plan: Unasyn 3 grams IV every 6 hours.  Monitor renal function, culture results, and clinical status.   Height: 5\' 1"  (154.9 cm) Weight: 77.5 kg (170 lb 13.7 oz) IBW/kg (Calculated) : 47.8  Temp (24hrs), Avg:99.2 F (37.3 C), Min:98 F (36.7 C), Max:100.1 F (37.8 C)  Recent Labs  Lab 08/26/19 0555 08/26/19 0555 08/26/19 0732 08/27/19 0230 08/28/19 0500 08/29/19 0457 08/31/19 0216  WBC 11.4*   < > 11.8* 11.6* 13.1* 10.2 18.4*  CREATININE 1.78*  --   --  1.12* 0.96 0.87 0.93   < > = values in this interval not displayed.    Estimated Creatinine Clearance: 54.6 mL/min (by C-G formula based on SCr of 0.93 mg/dL).    No Known Allergies  Antimicrobials this admission: Unasyn 6/20 >  Dose adjustments this admission:   Microbiology results: 6/18 Resp cx >> 6/18 Blood cx >> 6/14 MRSA PCR negative  Thank you for allowing pharmacy to be a part of this patient's care.  7/14, PharmD, BCPS, BCCCP Clinical Pharmacist Please refer to [facility] for [facility] numbers 08/31/2019 1:48 PM

## 2019-08-31 NOTE — Progress Notes (Signed)
NAME:  Donna [name], MRN:  [id number], DOB:  July 01, 1950, LOS: 6 ADMISSION DATE:  08/25/2019, CONSULTATION DATE:  08/27/2019 REFERRING MD:  Dr. 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  Was placed on ASA/ brillinta.  Monitored in Neuro ICU.  Noted to have developed hypotension, increased left sided weakness and lethargy on 6/15 am with Hgb drop from 13.3 to 6.9 with new AKI.  Post MRI/ MRA showed scattered small acute infarcts along the right MCA/ watershed territory suspected to be resultant either post procedure vs hypoperfusion.  Neurology was consulted with recommendations to keep SBP > 140 for cerebral perfusion.  A CT abd/ pelvis was obtained given concern for retroperitoneal bleed which showed a mild to moderate amount of retroperitoneal hematoma in the right iliac and inguinal regiones with moderate size hematoma noted the the soft tissues anterior to the musculature of the right hip; no definite intrapertioneal hemorrhage. INR, PT, and platelets rechecked and were wnl.  Vascular surgery was consulted with plans to observe given bleeding was mostly into the muscle and tissue.  She was started on neosynephrine to keep SBP goal > 140-160 for cerebral perfusion.  PCCM consulted to help with vasopressor support.  S/p 1L bolus  Repeat Hgb now 6.9-> 7-> 5.1.   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    ETT 6/19 >>  Significant Diagnostic Tests:   6/15 MRA/ MRI brain  >> 1. Scattered small acute infarcts along the right MCA/watershed territory. Solitary small acute infarct in the left parietal cortex and right cerebellum. 2. Improved right ICA patency at the petrous segment. 3. Known severe right M1 segment stenosis. 4. 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  6/15 CT a/p wo contrast >> 1. Mild to moderate amount of retroperitoneal hematoma is noted in the right iliac and inguinal regions. Moderate size hematoma is noted in the soft tissues anterior to the musculature of the right hip. No definite intraperitoneal hemorrhage is noted. 2. Moderate size fat containing periumbilical hernia. Aortic Atherosclerosis   Echo 6/16 >> normal LVEF   Micro Data:  6/10 SARS 2 >> neg 6/14 MRSA PCR >> neg  Antimicrobials:  6/14 cefazolin pre-op  6/20 unasyn >>  Interim history/subjective:   Developed hypoxia and respiratory distress overnight requiring intubation. Sedated with fentanyl drip   Objective   Blood pressure (!) 139/52, pulse 88, temperature 99.4 F (37.4 C), temperature source Axillary, resp. rate 15, height 5\' 1"  (1.549 m), weight 77.5 kg, SpO2 100 %.    Vent Mode: PRVC FiO2 (%):  [40 %-100 %] 40 % Set Rate:  [15 bmp] 15 bmp Vt Set:  [410 mL] 410 mL PEEP:  [5 cmH20] 5 cmH20 Plateau Pressure:  [12 cmH20-28 cmH20] 18 cmH20  Intake/Output Summary (Last 24 hours) at 08/31/2019 1320 Last data filed at 08/31/2019 1200 Gross per 24 hour  Intake 2579.75 ml  Output 775 ml  Net 1804.75 ml   Filed Weights   08/25/19 0705 08/30/19 0500  Weight: 68.5 kg 77.5 kg    Examination:  Obese woman , intubated, no  distress, sedated on fentanyl drip No JVD, no accessory muscle use Bilateral breath sounds clear, no rhonchi S1-S2 normal Soft nontender abdomen Neuro -mild agitation, RASS +1, does not follow commands, right gaze preference, purposeful on left arm but flaccid left lower extremity, moves right side well Hematoma right groin and on back appears to be improving   Chest x-ray 6/ 20 shows increased bilateral airspace disease, no effusions Labs from 6/20 show hyponatremia, hyperglycemia, new leukocytosis, stable anemia   Resolved Hospital Problem list   Hypotension   Assessment & Plan:   Acute encephalopathy - -in setting of CNS depressing medications, R MCA CVA  P -Switch from fentanyl drip to Precedex, goal RASS 0 to -1    s/p RT ICA cer/petrous junction balloon angioplasty for severe stenosis. Post-procedure with residual stenosis of approx 25 %. Severe stenosis of RT MCA M1 seg with improved flow post angioplasty Right MCA watershed territory infarct Small left frontal cortex and right cerebellum    P:  - Per Neuro IR and Neurology -PT eval   Acute respiratory failure with hypoxia - -CXR with pulm edema, mild BUL opacities  -Mild upper airway pseudowheeze 6/18, diuresed and given Solu-Medrol for stridor -Reintubated for acute hypercarbic respiratory failure 6/20 with bilateral infiltrates?  Aspiration versus edema , favor aspiration given leukocytosis  P -Supplemental O2 as needed for SpO2 goal > 92% -Duonebs -Check respiratory culture and empirically add Unasyn. -Check BNP and diurese with Lasix   Acute blood loss anemia - Jehovah's Witness: no blood products including albumin  - CTA Abd/Pelvis 08/26/19 with hematoma of the right groin and retroperitoneum with expansion to the right rectus femoris expansion. No active bleeding S/p -  feraheme x2 doses P -Getting labs every other day -Hold ASA/Brillinta -aranesp half dosage weekly (first dose August 27, 2019]    AKI -resolved   DM, type II - uncontrolled P:  Increase Lantus 15 units daily Increase tube feed coverage 5 units every 4 SSI  Hypokalemia and hypophosphatemia P: -Repleted  Best practice:  Diet: NPO  Pain/Anxiety/Delirium protocol (if indicated): n/a VAP protocol (if indicated): n/a DVT prophylaxis: SCDs only in setting of acute anemia/blood loss GI prophylaxis: Pepcid Glucose control: CBG q 4 Mobility: BR Code Status: Full   Family Communication: - son, [name] at [phone]   Disposition: ICU  The patient is critically ill with multiple organ systems failure and requires high complexity decision making for assessment and support, frequent evaluation and titration of therapies, application of advanced monitoring technologies and extensive interpretation of multiple databases. Critical Care Time devoted to patient care services described in this note independent of APP/resident  time is 35 minutes.    Kara Mead MD. [facility] Flood. [facility]  If no response to pager , please call [phone]   08/31/2019

## 2019-08-31 NOTE — Progress Notes (Signed)
PT Cancellation Note  Patient Details Name: Donna [name] MRN: [id number] DOB: 16-Jul-1950   Cancelled Treatment:    Reason Eval/Treat Not Completed: Medical issues which prohibited therapy. Pt requiring intubation overnight. This is the 5th consecutive day PT has cancelled due to medical inappropriateness or bedrest orders. PT will sign off at this time. Please re-consult when pt is medically appropriate for skilled PT intervention, able to follow commands, and when off bedrest.   Arlyss Gandy 08/31/2019, 7:31 AM

## 2019-08-31 NOTE — Progress Notes (Signed)
eLink Physician-Brief Progress Note Patient Name: Donna [name] DOB: 1950/09/10 MRN: [id number]   Date of Service  08/31/2019  HPI/Events of Note  Notified of agitation. Patient s/p intubation CXR reviewed and ETT in adequate position  eICU Interventions  Fentanyl drip order revised      Intervention Category Minor Interventions: Agitation / anxiety - evaluation and management  Darl Pikes 08/31/2019, 12:40 AM

## 2019-08-31 NOTE — Progress Notes (Signed)
EEG complete - results pending 

## 2019-08-31 NOTE — Significant Event (Signed)
  Critical Care Note While on the unit, approached by RN to urgently assess the patient's respiratory status.  Patient previously on [facility], has become progressively lethargic with increased WOB and increasing oxygen requirement now on 100% NRB with sats still around 80%.  Quickly assessed patient. Patient obtunded, not responding.  Very labored respiratory efforts.  Coarse breath sounds.  Emergent need for intubation -- performed RSI without difficulty.  Gave Etomidate 20mg  and Succinylcholine 110mg .  Positive colometric CO2 color change, equal breath sounds and improved oxygen afterward.  Vitals stable.   Critical Care time:  45 minutes  ___________________ . , MD [facility]

## 2019-08-31 NOTE — Progress Notes (Addendum)
CRITICAL VALUE ALERT  Critical Value:  Hgb 5.0  Date & Time Notied:  08/31/2019 0255  Provider Notified: E-Link  Orders Received/Actions taken: E-link RN notified, who I asked to pass on to E-Link MD. Awaiting any new orders, however patient is a blood products refusal and hgb has been low.

## 2019-08-31 NOTE — Procedures (Signed)
Intubation Procedure Note Donna [name] [id number] 1951/02/23  Procedure: Intubation Indications: Respiratory insufficiency  Procedure Details Consent: Unable to obtain consent because of emergent medical necessity. Time Out: Verified patient identification, verified procedure, site/side was marked, verified correct patient position, special equipment/implants available, medications/allergies/relevent history reviewed, required imaging and test results available.  Performed  Maximum sterile technique was used including n/a.  MAC and 4    Evaluation Hemodynamic Status: BP stable throughout; O2 sats: transiently fell to 83% during intubation, quickly recovered after intubated and bagged. Patient's Current Condition: stable Complications: No apparent complications Patient did tolerate procedure well. Chest X-ray ordered to verify placement.  CXR: tube position acceptable.  ___________________ Samuella Cota. Reinaldo Berber, MD [place]

## 2019-08-31 NOTE — Progress Notes (Signed)
Pt's son, Christne Platts, called and was update by RN that his mother had been intubated overnight.

## 2019-08-31 NOTE — Progress Notes (Addendum)
E-Link RN called this RN to ask me to get fentanyl onboard for sedation. This RN notified her that I had it in hand and was in the midst of hanging it. This RN then asked E-Link RN if she will speak to E-Link MD. I requested a change to fentanyl infusion orders to be titratable with a higher range for sedation. Current ordered is 10 mcg/hour continuous. Also updated Neuro IR physician on call de Melchor Amour about patient's condition, that patient is more lethargic and that patient required intubation. I inquired about a head CT. No orders for head CT. New orders for a CBC lab. Will draw ordered morning CBC early. Will continue to monitor.

## 2019-08-31 NOTE — Progress Notes (Signed)
Patient critically ill.  Noted patient was intubated and mechanically ventilated.  Will sign off from the hospitalist medicine side.  Consult medical team if necessary after transfer from the ICU.

## 2019-08-31 NOTE — Procedures (Addendum)
Patient Name: Donna [name]  MRN: [id number]  Epilepsy Attending: Charlsie Quest  Referring Physician/Provider: Dr Janit Bern Date: 08/31/2019 Duration: 24.33 mins  Patient history: 69yo F with R MCA infarct and worsening ams. EEG to evaluate for seizure  Level of alertness: lethargic  AEDs during EEG study: None  Technical aspects: This EEG study was done with scalp electrodes positioned according to the 10-20 International system of electrode placement. Electrical activity was acquired at a sampling rate of 500Hz  and reviewed with a high frequency filter of 70Hz  and a low frequency filter of 1Hz . EEG data were recorded continuously and digitally stored.   Description: No posterior dominant rhythm was seen. EEG showed continuous generalized and lateralized right hemisphere 3 to 6 Hz theta-delta slowing. Sharp transients were seen in left anterior -temporal region. Hyperventilation and photic stimulation were not performed.     ABNORMALITY -Continuous slow, generalized and lateralized right hemisphere  IMPRESSION: This study is suggestive of cortical dysfunction in right hemisphere most likely secondary to underlying infarct. There is also evidence of moderate diffuse encephalopathy, nonspecific etiology. No seizures or definite epileptiform discharges were seen throughout the recording.     Donna [name] 

## 2019-08-31 NOTE — Progress Notes (Signed)
STROKE TEAM PROGRESS NOTE   INTERVAL HISTORY  The patient became acutely dyspneic overnight requiring intubation.  Etiology is unclear at this time.  Neurologically seems unchanged although somewhat less responsive today.   Vitals:   08/31/19 0600 08/31/19 0700 08/31/19 0732 08/31/19 0800  BP: 131/60 (!) 126/55  (!) 115/47  Pulse: 93 86 94 78  Resp: 14 15 16 15   Temp:    99.4 F (37.4 C)  TempSrc:    Axillary  SpO2: 100% 100% 100% 98%  Weight:      Height:   5\' 1"  (1.549 m)    CBC:  Recent Labs  Lab 08/26/19 0555 08/26/19 0555 08/26/19 0732 08/26/19 2244 08/29/19 0457 08/29/19 0457 08/31/19 0107 08/31/19 0216  WBC 11.4*   < > 11.8*   < > 10.2  --   --  18.4*  NEUTROABS 8.9*  --  8.9*  --   --   --   --   --   HGB 6.9*   < > 7.0*   < > 4.7*   < > 5.1* 5.0*  HCT 20.4*   < > 20.4*   < > 15.6*   < > 15.0* 16.8*  MCV 91.1   < > 89.9   < > 98.1  --   --  103.7*  PLT 206   < > 223   < > 177  --   --  246   < > = values in this interval not displayed.   Basic Metabolic Panel:  Recent Labs  Lab 08/29/19 0457 08/29/19 0457 08/31/19 0107 08/31/19 0216  NA 147*   < > 150* 148*  K 2.8*   < > 4.6 4.5  CL 112*  --   --  113*  CO2 27  --   --  28  GLUCOSE 177*  --   --  326*  BUN 14  --   --  33*  CREATININE 0.87  --   --  0.93  CALCIUM 8.2*  --   --  8.5*  MG 2.0  --   --  2.4  PHOS 1.7*  --   --  4.0   < > = values in this interval not displayed.   Lipid Panel:     Component Value Date/Time   CHOL 217 (H) 05/22/2019 1312   TRIG 114 05/22/2019 1312   HDL 65 05/22/2019 1312   CHOLHDL 3.3 05/22/2019 1312   LDLCALC 132 (H) 05/22/2019 1312   HgbA1c:  Lab Results  Component Value Date   HGBA1C 11.2 (H) 05/22/2019   Urine Drug Screen: No results found for: LABOPIA, COCAINSCRNUR, LABBENZ, AMPHETMU, THCU, LABBARB  Alcohol Level No results found for: [facility]  IMAGING past 24 hours Portable Chest x-ray  Result Date: 08/31/2019 CLINICAL DATA:  Endotracheal tube present.   OG tube placement. EXAM: PORTABLE CHEST 1 VIEW COMPARISON:  Radiograph 08/29/2019 FINDINGS: Endotracheal tube tip 2 cm from the carina. Previous weighted enteric tube is been removed, and there is a new enteric tube with tip and side-port below the diaphragm. Right upper extremity central line tip in the SVC. Unchanged heart size and mediastinal contours. Progressive pulmonary edema from prior exam. Possible trace pleural effusions. No pneumothorax. IMPRESSION: 1. Endotracheal tube tip 2 cm from the carina. 2. Enteric tube tip and side-port below the diaphragm in the stomach. 3. Progressive pulmonary edema from prior exam. Electronically Signed   By: Keith Rake M.D.   On: 08/31/2019 00:22  PHYSICAL EXAM    She is intubated.  Temp:  [98 F (36.7 C)-100.1 F (37.8 C)] 99.4 F (37.4 C) (06/20 0800) Pulse Rate:  [78-117] 78 (06/20 0800) Resp:  [11-28] 15 (06/20 0800) BP: (109-187)/(36-114) 115/47 (06/20 0800) SpO2:  [86 %-100 %] 98 % (06/20 0800) FiO2 (%):  [40 %-100 %] 40 % (06/20 0732)  General - Well nourished, well developed  Ophthalmologic - fundi not visualized due to noncooperation.  Cardiovascular - Regular rhythm and rate.  Neuro - lethargic; she does open her eyes to sternal rub but does not follow commands.  Eyes are midline.  Pupils are pinpoint.  With forced eye opening, right gaze preference, not cross midline. Left neglect, not blinking to visual threat on the left. PERRL. Left facial droop. Left UE 0/5 and LLE 0/5 with pain stimulation. RUE 3/5 at least and RLE 2/5 with pain. Sensation, coordination not cooperative and gait not tested.   ASSESSMENT/PLAN [name] is a 69 y.o. 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 MRI with scattered R MCA watershed infarcts, L frontal cortex and R cerebellar infarcts.   Intracranial Stenoses s/p angioplasty History of stroke  06/2018 R lentiform nucleus, external and extreme capsule infarct. mild periventricular small vessel disease. multiple uncontrolled risk factors. LDL 131 and A1C 11.4. OP w/u. Put on plavix but non-compliant. Intolerant to statins.   Found to have severe intracranial stenoses on MRA - referred to Dr. Corliss Skains.    10/2018 cerebral angio - 85% stenosis of the mid M1 segment of the right MCA. Severe high-grade approximately 90% stenosis of left VBJ, and of 60% just distal to the origin of the AICA. Approximately 50% stenosis at the origin of the right ICA at the bulb, and also about 60% of the right ICA at the cervical petrous junction.   Plan for 11/2018 procedure but P2Y12 was high, plavix changed to brilinta and procedure postponed.   08/25/2019 (Dr. Corliss Skains) S/p R cervical ICA/Petrous jxn angioplasty 08/25/19 ->25% residual stenosis. Severe stenosis of RT MCA M 1 seg with improved flow post angioplasty.    post-procedure worsening L sided weakness and neglect  MRI with scattered R MCA watershed infarcts, L frontal cortex and R cerebellar infarcts.   As needed labetalol started for persistent elevated blood pressure.  Severe Acute Blood Loss Anemia - Hematoma R Thigh and retroperitoneal hematoma  CT abd/pelvis 6/15 large R groin retroperitoneal hematoma iliac & inguinal mostly in muscle and soft tissue  Hgb 13.3->6.9->7->5.1->4.8->5.0->5.2->4.7->5.1->5.0  P2Y12 164  On Aranesp / Feraheme   VVS consult (Dr Edilia Bo) - no procedure needed  Pt is a Jehovah's Witness - refuses all blood products including albumin, son confirmed  Aspirin and brilinta now on hold  Not safe for wound exploration d/t risk of additional blood loss  Hgb drop with dilutional component d/t increased  fluids to maintain stent patency - now getting NS at 25 cc / hr ; Jevity 50 cc / hr  Femstop considered, not felt to be actively bleeding  Repeat CT abd/pelvis 6/16 no active bleeding  Treated with IV iron/erythropoietin following consult w/ [facility]  Limit blood draws where possible   Stroke:   R MCA scattered watershed, L frontal and R cerebellar infarcts following R ICA/P2  jxn angioplasty   MRI  Scattered R MCA/watershed infarcts. L parietal cortex infarct. R cerebellar infarct.  MRA   Improved R ICA patency. Severe R M1 stenosis. High-grade L V4 and moderate mid BA stenosis.   CT head No progression of infarcts or hemorrhage  2D echo EF 60-65%. No source of embolus  aspirin 81 mg daily and Brilinta (ticagrelor) 90 mg bid prior to admission, continue hold given hmg   Therapy recommendations:  not medically ready for therapy  Disposition:  pending   Acute Respiratory distress   Increasing O2 demand  Received one dose of lasix 6/16  On low dose precedex -> off now   CCM on board  Currently intubated.  We will try to reduce sedation as much as possible.  An EEG was obtained to evaluate for possible seizures for the acute respiratory changes.  Hypotension  BP goal 120-160  PICC line placed  Treated w/ Max neo -> now off  BP now ~120 (SBP 115->131)  CCM onboard   . Long-term BP goal 130-150 given intracranial stenoses  Hyperlipidemia  Home meds:  Repatha & fish oil  Intolerant to statins  LDL 132 in March, goal < 70  Continue Repatha at discharge  Diabetes type II Uncontrolled  HgbA1c 11.2 in March, goal < 7.0  CBGs  SSI  Hyperglycemia - 326 (SSI and scheduled insulin) continue to monitor on tube feeds  Dysphagia . Secondary to stroke . NPO . Cortrak TF @ 50 & IVF @ 25 . Speech on board   Hypokalemia  Potassium - 4.5->3.5->3.4->3.7->3.5->3.3 ->2.8->4.6->4.5  Pt had total of 160 meq KCL (Cortrak) yesterday plus 30 mmol  potassium phosphate  Lasix 20 mg IV yesterday   Hypophosphatemia - 1.7->4.0  Limit blood draws where possible due to anemia  Fever  Tmax - 110.9->101.7->99.5->99.4 ax  Leukocytosis 11.8->11.4->11.6->13.1->10.2->18.4  Currently no antibiotics  Solumedrol 60 mg IV x 1 on 08/29/19   Blood cultures - 6/18 - no growth x 2 days - final pending   CXR 08/29/19 - Stable mild bilateral interstitial prominence. Again mild interstitial edema and/or pneumonitis cannot be excluded.  U/A - 6/19 - rare bacteria - not c/w UTI  Limit blood draws where possible due to anemia  Other Stroke Risk Factors  Advanced age  Overweight, Body mass index is 32.28 kg/m., recommend weight loss, diet and exercise as appropriate   Hx stroke/TIA (see above)  Family hx stroke (mother)  Snoring. OP sleep eval showed mild Obstructive sleep apnea overall but severe apnea during REM w/ CPAP recommended   Other Active Problems  Hx noncompliance  Metabolic enceaphalopath  AKI pre-renal in setting of anemia 0.93->1.78->1.12->0.96->0.87->0.93  Hx palpitations on coreg, event monitor ordered 09/2018 but never completed   Code status - full code  Appreciate CCM assistance   Hospital day # 6  This patient is critically ill due to stroke post procedure, severe anemia, hematoma on the thigh, hypotension and at significant risk of neurological worsening, death form severe anemia, bleeding, recurrent stroke, intracranial stenosis. This patient's care requires constant monitoring of vital signs, hemodynamics, respiratory and cardiac monitoring, review of multiple databases, neurological assessment, other specialists and medical decision making of high complexity. I spent86minutes of neurocritical care time in the care of this patient     To contact Stroke Continuity provider, please refer to [email or web address]. After hours, contact General Neurology

## 2019-08-31 NOTE — Evaluation (Signed)
SLP Cancellation Note  Patient Details Name: OCEANIA NOORI MRN: [id number] DOB: Feb 17, 1951   Cancelled treatment:       Reason Eval/Treat Not Completed: Other (comment) (pt intubated over night, will continue efforts)   [name] 08/31/2019, 7:47 AM   Rolena Infante, MS [facility] SLP Acute Rehab Services Office [phone]

## 2019-08-31 NOTE — Progress Notes (Addendum)
Patient has oxygen saturation in 80s with non rebreather mask. Dr. Coralie Common of critical care on unit. I asked him to assess patient. Still awaiting call back from [employer] about blood pressure PRN medication. Will continue to monitor.

## 2019-09-01 ENCOUNTER — Inpatient Hospital Stay (HOSPITAL_COMMUNITY): Payer: Medicare HMO

## 2019-09-01 LAB — PATHOLOGIST SMEAR REVIEW

## 2019-09-01 LAB — GLUCOSE, CAPILLARY
Glucose-Capillary: 229 mg/dL — ABNORMAL HIGH (ref 70–99)
Glucose-Capillary: 192 mg/dL — ABNORMAL HIGH (ref 70–99)
Glucose-Capillary: 142 mg/dL — ABNORMAL HIGH (ref 70–99)
Glucose-Capillary: 136 mg/dL — ABNORMAL HIGH (ref 70–99)

## 2019-09-01 MED ORDER — FUROSEMIDE 10 MG/ML IJ SOLN
40.0000 mg | Freq: Once | INTRAMUSCULAR | Status: AC
  Administered 2019-09-01: 40 mg via INTRAVENOUS
  Filled 2019-09-01: qty 4

## 2019-09-01 NOTE — Progress Notes (Signed)
Orthopedic Tech Progress Note Patient Details:  Donna [name] 1951/01/23 [id number] Called in order to [employer] for a RESTING HAND SPLINT Patient ID: Donna [name], female   DOB: 08-23-1950, 69 y.o.   MRN: [id number]   Donald Pore 09/01/2019, 4:12 PM

## 2019-09-01 NOTE — Progress Notes (Signed)
NAME:  Donna [name], MRN:  [id number], DOB:  [birth year], LOS: 7 ADMISSION DATE:  08/25/2019, CONSULTATION DATE:  08/27/2019 REFERRING MD:  Dr. 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  Was placed on ASA/ brillinta.  Monitored in Neuro ICU.  Noted to have developed hypotension, increased left sided weakness and lethargy on 6/15 am with Hgb drop from 13.3 to 6.9 with new AKI.  Post MRI/ MRA showed scattered small acute infarcts along the right MCA/ watershed territory suspected to be resultant either post procedure vs hypoperfusion.  Neurology was consulted with recommendations to keep SBP > 140 for cerebral perfusion.  A CT abd/ pelvis was obtained given concern for retroperitoneal bleed which showed a mild to moderate amount of retroperitoneal hematoma in the right iliac and inguinal regiones with moderate size hematoma noted the the soft tissues anterior to the musculature of the right hip; no definite intrapertioneal hemorrhage. INR, PT, and platelets rechecked and were wnl.  Vascular surgery was consulted with plans to observe given bleeding was mostly into the muscle and tissue.  She was started on neosynephrine to keep SBP goal > 140-160 for cerebral perfusion.  PCCM consulted to help with vasopressor support.  S/p 1L bolus  Repeat Hgb now 6.9-> 7-> 5.1.   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    ETT 6/19 >>  Significant Diagnostic Tests:   6/15 MRA/ MRI brain  >> 1. Scattered small acute infarcts along the right MCA/watershed territory. Solitary small acute infarct in the left parietal cortex and right cerebellum. 2. Improved right ICA patency at the petrous segment. 3. Known severe right M1 segment stenosis. 4. 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  6/15 CT a/p wo contrast >> 1. Mild to moderate amount of retroperitoneal hematoma is noted in the right iliac and inguinal regions. Moderate size hematoma is noted in the soft tissues anterior to the musculature of the right hip. No definite intraperitoneal hemorrhage is noted. 2. Moderate size fat containing periumbilical hernia. Aortic Atherosclerosis   Echo 6/16 >> normal LVEF   Micro Data:  6/10 SARS 2 >> neg 6/14 MRSA PCR >> neg  Antimicrobials:  6/14 cefazolin pre-op  6/20 unasyn >>  Interim history/subjective:   No issues overnight.  Remains comfortable in pressure support ventilation.  Discussed with bedside nursing  Objective   Blood pressure (!) 137/58, pulse 89, temperature 99.1 F (37.3 C), temperature source Axillary, resp. rate (!) 24, height 5\' 1"  (1.549 m), weight 77.5 kg, SpO2 100 %.    Vent Mode: PSV;CPAP FiO2 (%):  [40 %] 40 % Set Rate:  [15 bmp] 15 bmp Vt Set:  [410 mL] 410 mL PEEP:  [5 cmH20] 5 cmH20 Pressure Support:  [10 cmH20] 10 cmH20 Plateau  Pressure:  [17 cmH20-18 cmH20] 17 cmH20   Intake/Output Summary (Last 24 hours) at 09/01/2019 0743 Last data filed at 09/01/2019 0600 Gross per 24 hour  Intake 1743.09 ml  Output 2300 ml  Net -556.91 ml   Filed Weights   08/25/19 0705 08/30/19 0500  Weight: 68.5 kg 77.5 kg     Examination:  General: Elderly obese female intubated on mechanical life support HEENT: NCAT, not tracking Neck: No JVD Lungs: Bilateral ventilated breath sounds Heart: S1-S2, regular rate rhythm Abdomen: Soft nontender nondistended Neuro: Does not follow commands, still has a rightward gaze could not get her to follow any commands this morning. Extremities: Hematoma in the right groin per nursing has improved much softer.  Chest x-ray 09/01/2019: Bilateral pulmonary vascular congestion Last labs were from 08/31/2019: Repeats planning for tomorrow.  Trying to hold off on excess blood draws   Resolved Hospital Problem list   Hypotension   Assessment & Plan:   Acute metabolic encephalopathy - -in setting of CNS depressing medications, R MCA CVA P -Continue Precedex alone Goal RASS 0 -Mental status precludes liberation ventilator at this time   s/p RT ICA cer/petrous junction balloon angioplasty for severe stenosis. Post-procedure with residual stenosis of approx 25 %. Severe stenosis of RT MCA M1 seg with improved flow post angioplasty Right MCA watershed territory infarct Small left frontal cortex and right cerebellum  P:  -Per neuro IR and neurology  Acute respiratory failure with hypoxia - -CXR with pulm edema, mild BUL opacities  -Mild upper airway pseudowheeze 6/18, diuresed and given Solu-Medrol for stridor -Reintubated for acute hypercarbic respiratory failure 6/20 with bilateral infiltrates?  Aspiration versus edema , favor aspiration given leukocytosis P -Continue weaning from ventilator -Wean PEEP and FiO2 -Complete course of Unasyn -She does have pulmonary edema on chest x-ray today 09/01/2019, Lasix 40 mg x 1   Acute blood loss anemia - Jehovah's Witness: no blood products including albumin - CTA Abd/Pelvis 08/26/19 with hematoma of the right groin and retroperitoneum with expansion to the right rectus femoris expansion. No active bleeding S/p -  feraheme x2  doses P -Labs every other day to avoid excess blood drawl -No blood products  AKI -resolved  DM, type II - uncontrolled P:  Lantus plus CBG plus SSI  Hypokalemia and hypophosphatemia P: -Replete as necessary -Continue to monitor  Best practice:  Diet: NPO  Pain/Anxiety/Delirium protocol (if indicated): n/a VAP protocol (if indicated): n/a DVT prophylaxis: SCDs only in setting of acute anemia/blood loss GI prophylaxis: Pepcid Glucose control: CBG q 4 Mobility: BR Code Status: Full  Family Communication: - son, [name] at [phone]  Disposition: ICU  This patient is critically ill with multiple organ system failure; which, requires frequent high complexity decision making, assessment, support, evaluation, and titration of therapies. This was completed through the application of advanced monitoring technologies and extensive interpretation of multiple databases. During this encounter critical care time was devoted to patient care services described in this note for 32 minutes.  Garner Nash, DO [facility] 09/01/2019 7:43 AM

## 2019-09-01 NOTE — Progress Notes (Signed)
STROKE TEAM PROGRESS NOTE   INTERVAL HISTORY  She remains intubated and on ventilatory support for respiratory failure..  Neurologically seems unchanged although somewhat less responsive today.  She is afebrile.  Vital signs are stable.  She has been started on Unasyn yesterday for presumed aspiration pneumonia she remains intermittently agitated and is presently on Precedex for sedation.  She remains anemic with hemoglobin of 5 but is a Jehovah's Witness and cannot be transfused.  Chest x-ray today shows bibasilar airspace disease reflecting atelectasis with slight increase in moderate pulmonary vascular congestion.  Vitals:   09/01/19 1300 09/01/19 1400 09/01/19 1506 09/01/19 1507  BP: (!) 145/64 (!) 140/49 (!) 137/53   Pulse: 94 77 76   Resp: 15 15 14    Temp:      TempSrc:      SpO2: 100% 100% 100% 100%  Weight:      Height:       CBC:  Recent Labs  Lab 08/26/19 0555 08/26/19 0555 08/26/19 0732 08/26/19 2244 08/29/19 0457 08/29/19 0457 08/31/19 0107 08/31/19 0216  WBC 11.4*   < > 11.8*   < > 10.2  --   --  18.4*  NEUTROABS 8.9*  --  8.9*  --   --   --   --   --   HGB 6.9*   < > 7.0*   < > 4.7*   < > 5.1* 5.0*  HCT 20.4*   < > 20.4*   < > 15.6*   < > 15.0* 16.8*  MCV 91.1   < > 89.9   < > 98.1  --   --  103.7*  PLT 206   < > 223   < > 177  --   --  246   < > = values in this interval not displayed.   Basic Metabolic Panel:  Recent Labs  Lab 08/29/19 0457 08/29/19 0457 08/31/19 0107 08/31/19 0216  NA 147*   < > 150* 148*  K 2.8*   < > 4.6 4.5  CL 112*  --   --  113*  CO2 27  --   --  28  GLUCOSE 177*  --   --  326*  BUN 14  --   --  33*  CREATININE 0.87  --   --  0.93  CALCIUM 8.2*  --   --  8.5*  MG 2.0  --   --  2.4  PHOS 1.7*  --   --  4.0   < > = values in this interval not displayed.   Lipid Panel:     Component Value Date/Time   CHOL 217 (H) 05/22/2019 1312   TRIG 114 05/22/2019 1312   HDL 65 05/22/2019 1312   CHOLHDL 3.3 05/22/2019 1312   LDLCALC  132 (H) 05/22/2019 1312   HgbA1c:  Lab Results  Component Value Date   HGBA1C 11.2 (H) 05/22/2019   Urine Drug Screen: No results found for: LABOPIA, COCAINSCRNUR, LABBENZ, AMPHETMU, THCU, LABBARB  Alcohol Level No results found for: ETH  IMAGING past 24 hours DG Chest Port 1 View  Result Date: 09/01/2019 CLINICAL DATA:  Acute respiratory failure. EXAM: PORTABLE CHEST 1 VIEW COMPARISON:  One-view chest x-ray 08/29/2019 FINDINGS: Endotracheal tube is stable. NG tube courses off the inferior border the film. Right-sided PICC line is stable. Heart size is exaggerated by low lung volumes. Atherosclerotic changes are noted at the aortic arch. Moderate pulmonary vascular congestion has increased slightly. Bibasilar airspace opacities likely  reflect atelectasis. Atherosclerotic calcifications are noted in the neck. IMPRESSION: 1. Slight increase in moderate pulmonary vascular congestion. 2. Bibasilar airspace disease likely reflects atelectasis. Electronically Signed   By: Marin Roberts M.D.   On: 09/01/2019 07:16   EEG adult  Result Date: 08/31/2019 Charlsie Quest, MD     08/31/2019  4:20 PM Patient Name: MALIHA OUTTEN MRN: [id number] Epilepsy Attending: Charlsie Quest Referring Physician/Provider: Dr Janit Bern Date: 08/31/2019 Duration: 24.33 mins Patient history: 69yo F with R MCA infarct and worsening ams. EEG to evaluate for seizure Level of alertness: lethargic AEDs during EEG study: None Technical aspects: This EEG study was done with scalp electrodes positioned according to the 10-20 International system of electrode placement. Electrical activity was acquired at a sampling rate of 500Hz  and reviewed with a high frequency filter of 70Hz  and a low frequency filter of 1Hz . EEG data were recorded continuously and digitally stored. Description: No posterior dominant rhythm was seen. EEG showed continuous generalized and lateralized right hemisphere 3 to 6 Hz theta-delta slowing. Sharp  transients were seen in left anterior -temporal region. Hyperventilation and photic stimulation were not performed.   ABNORMALITY -Continuous slow, generalized and lateralized right hemisphere IMPRESSION: This study is suggestive of cortical dysfunction in right hemisphere most likely secondary to underlying infarct. There is also evidence of moderate diffuse encephalopathy, nonspecific etiology. No seizures or definite epileptiform discharges were seen throughout the recording. Priyanka O Yadav    PHYSICAL EXAM    She is intubated.  Temp:  [98.7 F (37.1 C)-101.3 F (38.5 C)] 99.4 F (37.4 C) (06/21 1200) Pulse Rate:  [76-107] 76 (06/21 1506) Resp:  [13-24] 14 (06/21 1506) BP: (112-145)/(47-64) 137/53 (06/21 1506) SpO2:  [100 %] 100 % (06/21 1507) FiO2 (%):  [40 %] 40 % (06/21 1507)  General -middle-aged African-American lady who is intubated and sedated. Ophthalmologic - fundi not visualized due to noncooperation.  Cardiovascular - Regular rhythm and rate.  Neuro -stuporous she does open her eyes to sternal rub but does   follow only occasional right sided body commands.  Eyes are midline.  Pupils are pinpoint.  With forced eye opening, right gaze preference, not cross midline. Left neglect, not blinking to visual threat on the left. PERRL. Left facial droop. Left UE 0/5 and LLE 0/5 with pain stimulation. RUE 3/5 at least and RLE 2/5 with pain. Sensation, coordination not cooperative and gait not tested.   ASSESSMENT/PLAN Ms. DARITZA BREES is a 69 y.o. 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 MRI with scattered R MCA watershed infarcts, L frontal cortex and R cerebellar infarcts.    Intracranial Stenoses s/p angioplasty History of stroke  06/2018 R lentiform nucleus, external and extreme capsule infarct. mild periventricular small vessel disease. multiple uncontrolled risk factors. LDL 131 and A1C 11.4. OP w/u. Put on plavix but non-compliant. Intolerant to statins.   Found to have severe intracranial stenoses on MRA - referred to Dr. 73.    10/2018 cerebral angio - 85% stenosis of the mid M1 segment of the right MCA. Severe high-grade approximately 90% stenosis of left VBJ, and of 60% just distal to the origin of the AICA. Approximately 50% stenosis at the origin of the right ICA at the bulb, and also about 60% of  the right ICA at the cervical petrous junction.   Plan for 11/2018 procedure but P2Y12 was high, plavix changed to brilinta and procedure postponed.   08/25/2019 (Dr. Corliss Skains) S/p R cervical ICA/Petrous jxn angioplasty 08/25/19 ->25% residual stenosis. Severe stenosis of RT MCA M 1 seg with improved flow post angioplasty.    post-procedure worsening L sided weakness and neglect  MRI with scattered R MCA watershed infarcts, L frontal cortex and R cerebellar infarcts.   As needed labetalol started for persistent elevated blood pressure.  Severe Acute Blood Loss Anemia - Hematoma R Thigh and retroperitoneal hematoma  CT abd/pelvis 6/15 large R groin retroperitoneal hematoma iliac & inguinal mostly in muscle and soft tissue  Hgb 13.3->6.9->7->5.1->4.8->5.0->5.2->4.7->5.1->5.0  P2Y12 164  On Aranesp / Feraheme   [facility] consult (Dr Edilia Bo) - no procedure needed  Pt is a Jehovah's Witness - refuses all blood products including albumin, son confirmed  Aspirin and brilinta now on hold  Not safe for wound exploration d/t risk of additional blood loss  Hgb drop with dilutional component d/t increased fluids to maintain stent patency - now getting NS at 25 cc / hr ; Jevity 50 cc / hr  Femstop considered, not felt to be actively bleeding  Repeat CT  abd/pelvis 6/16 no active bleeding  Treated with IV iron/erythropoietin following consult w/ [facility]  Limit blood draws where possible   Stroke:   R MCA scattered watershed, L frontal and R cerebellar infarcts following R ICA/P2 jxn angioplasty   MRI  Scattered R MCA/watershed infarcts. L parietal cortex infarct. R cerebellar infarct.  MRA   Improved R ICA patency. Severe R M1 stenosis. High-grade L V4 and moderate mid BA stenosis.   CT head No progression of infarcts or hemorrhage  2D echo EF 60-65%. No source of embolus  aspirin 81 mg daily and Brilinta (ticagrelor) 90 mg bid prior to admission, continue hold given hmg   Therapy recommendations:  not medically ready for therapy  Disposition:  pending   Acute Respiratory distress   Increasing O2 demand  Received one dose of lasix 6/16  On low dose precedex -> off now   CCM on board  Currently intubated.  We will try to reduce sedation as much as possible.  An EEG was obtained to evaluate for possible seizures for the acute respiratory changes.  Hypotension  BP goal 120-160  PICC line placed  Treated w/ Max neo -> now off  BP now ~120 (SBP 115->131)  CCM onboard   . Long-term BP goal 130-150 given intracranial stenoses  Hyperlipidemia  Home meds:  Repatha & fish oil  Intolerant to statins  LDL 132 in March, goal < 70  Continue Repatha at discharge  Diabetes type II Uncontrolled  HgbA1c 11.2 in March, goal < 7.0  CBGs  SSI  Hyperglycemia - 326 (SSI and scheduled insulin) continue to monitor on tube feeds  Dysphagia . Secondary to stroke . NPO . Cortrak TF @ 50 & IVF @ 25 . Speech on board   Hypokalemia  Potassium - 4.5->3.5->3.4->3.7->3.5->3.3 ->2.8->4.6->4.5  Pt had total of 160 meq KCL (Cortrak) yesterday plus 30 mmol potassium phosphate  Lasix 20 mg IV yesterday   Hypophosphatemia - 1.7->4.0  Limit blood draws where possible due to anemia  Fever  Tmax -  110.9->101.7->99.5->99.4 ax  Leukocytosis 11.8->11.4->11.6->13.1->10.2->18.4  Currently no antibiotics  Solumedrol 60 mg IV x 1 on 08/29/19   Blood cultures - 6/18 - no growth x 2 days -  final pending   CXR 08/29/19 - Stable mild bilateral interstitial prominence. Again mild interstitial edema and/or pneumonitis cannot be excluded.  U/A - 6/19 - rare bacteria - not c/w UTI  Limit blood draws where possible due to anemia  Other Stroke Risk Factors  Advanced age  Overweight, Body mass index is 32.28 kg/m., recommend weight loss, diet and exercise as appropriate   Hx stroke/TIA (see above)  Family hx stroke (mother)  Snoring. OP sleep eval showed mild Obstructive sleep apnea overall but severe apnea during REM w/ CPAP recommended   Other Active Problems  Hx noncompliance  Metabolic enceaphalopath  AKI pre-renal in setting of anemia 0.93->1.78->1.12->0.96->0.87->0.93  Hx palpitations on coreg, event monitor ordered 09/2018 but never completed   Code status - full code  Appreciate CCM assistance   Hospital day # 7 Continue ventilatory support for respiratory failure as per CCM team and Unasyn for presumed aspiration pneumonia.  Neurological exam remains unchanged no further testing needed at present time.  Discussed with Dr.Icard critical care team.  No family available for discussion.  This patient is critically ill due to stroke post procedure, severe anemia, hematoma on the thigh, hypotension and at significant risk of neurological worsening, death form severe anemia, bleeding, recurrent stroke, intracranial stenosis. This patient's care requires constant monitoring of vital signs, hemodynamics, respiratory and cardiac monitoring, review of multiple databases, neurological assessment, other specialists and medical decision making of high complexity. I spent53minutes of neurocritical care time in the care of this patient  Delia Heady, MD   To contact Stroke Continuity  provider, please refer to [email or web address]. After hours, contact General Neurology

## 2019-09-01 NOTE — Progress Notes (Signed)
Referring Physician(s): Melvenia Beam  Supervising Physician: Luanne Bras  Patient Status:  [facility] - In-pt  Chief Complaint: None- intubated without sedation  Subjective:  Right ICA cervical/petrous junction stenosis s/p revascularization using balloon angioplasty via right radial and right femoral approach6/14/2021by Dr. Estanislado Pandy. Patient intubated without sedation. She opens eyes to name and follows simple commands of right side. Can spontaneously move right side with left-sided neglect. Right groin and right radial puncture sites c/d/i. Most recent hgb 08/31/2019 stable at 5.0. No family at bedside this AM.   Allergies: Patient has no known allergies.  Medications: Prior to Admission medications   Medication Sig Start Date End Date Taking? Authorizing Provider  amLODipine (NORVASC) 10 MG tablet Take 1 tablet (10 mg total) by mouth daily. 05/12/19  Yes Minette Brine, FNP  aspirin EC 81 MG tablet Take 81 mg by mouth every evening.   Yes [provider]  Calcium Carbonate-Vitamin D (CALCIUM-D PO) Take 2 tablets by mouth daily at 12 noon.    Yes [provider]  carvedilol (COREG) 6.25 MG tablet TAKE 1 TABLET(6.25 MG) BY MOUTH TWICE DAILY Patient taking differently: Take 6.25 mg by mouth 2 (two) times daily with a meal.  07/31/19  Yes Minette Brine, FNP  hydrALAZINE (APRESOLINE) 50 MG tablet Take 1 tablet (50 mg total) by mouth 2 (two) times daily. 06/16/19 09/14/19 Yes Elouise Munroe, MD  Insulin Degludec-Liraglutide (XULTOPHY) 100-3.6 UNIT-MG/ML SOPN Inject 30 Units into the skin daily. 08/14/19  Yes Minette Brine, FNP  telmisartan-hydrochlorothiazide (MICARDIS HCT) 40-12.5 MG tablet Take 1 tablet by mouth daily. 07/31/19  Yes Minette Brine, FNP  ticagrelor (BRILINTA) 90 MG TABS tablet Take 1 tablet (90 mg total) by mouth 2 (two) times daily. 06/12/19  Yes Elouise Munroe, MD  vitamin C (ASCORBIC ACID) 250 MG tablet Take 500 mg by mouth daily at 12 noon.    Yes [provider]  blood glucose meter kit and supplies KIT Dispense based on patient and insurance preference. Use up to four times daily as directed. (FOR ICD-9 250.00, 250.01). 05/10/18   Minette Brine, FNP  Blood Glucose Monitoring Suppl (TRUE METRIX METER) w/Device KIT 1 each by Does not apply route in the morning, at noon, in the evening, and at bedtime. 05/22/19   Minette Brine, FNP  Evolocumab (REPATHA SURECLICK) 449 MG/ML SOAJ Inject 140 mg into the skin every 14 (fourteen) days. 06/30/19   Elouise Munroe, MD  glucose blood (TRUE METRIX BLOOD GLUCOSE TEST) test strip Check blood sugar 4 times a day before meals and bedtime 05/22/19   Minette Brine, FNP  Insulin Glargine-Lixisenatide (SOLIQUA) 100-33 UNT-MCG/ML SOPN Inject 25 Units into the skin daily. Patient taking differently: Inject 30 Units into the skin daily.  07/14/19   Minette Brine, FNP  Multiple Vitamin (MULTIVITAMIN PO) Take 1 tablet by mouth daily at 12 noon.     [provider]  Omega-3 Fatty Acids (OMEGA-3 PLUS PO) Take 2 capsules by mouth daily at 12 noon.    [provider]     Vital Signs: BP (!) 137/58   Pulse 89   Temp 98.7 F (37.1 C) (Axillary)   Resp 14   Ht 5' 1"  (1.549 m)   Wt 170 lb 13.7 oz (77.5 kg)   SpO2 100%   BMI 32.28 kg/m   Physical Exam Vitals and nursing note reviewed.  Constitutional:      General: She is not in acute distress.    Comments: Intubated  without sedation.  Pulmonary:     Effort: Pulmonary effort is normal. No respiratory distress.     Comments: Intubated without sedation. Skin:    General: Skin is warm and dry.     Comments: Right groin puncture site soft without active bleeding or hematoma. Right radial puncture site soft without active bleeding or hematoma.  Neurological:     Comments: Intubated without sedation. She opens eyes to name and follows simple commands of right side. PERRL bilaterally. Can spontaneously move right side but no  spontaneous movements of left side (left-sided neglect). Distal pulses (DPs) palpable bilaterally with Doppler.      Imaging: DG Chest Port 1 View  Result Date: 09/01/2019 CLINICAL DATA:  Acute respiratory failure. EXAM: PORTABLE CHEST 1 VIEW COMPARISON:  One-view chest x-ray 08/29/2019 FINDINGS: Endotracheal tube is stable. NG tube courses off the inferior border the film. Right-sided PICC line is stable. Heart size is exaggerated by low lung volumes. Atherosclerotic changes are noted at the aortic arch. Moderate pulmonary vascular congestion has increased slightly. Bibasilar airspace opacities likely reflect atelectasis. Atherosclerotic calcifications are noted in the neck. IMPRESSION: 1. Slight increase in moderate pulmonary vascular congestion. 2. Bibasilar airspace disease likely reflects atelectasis. Electronically Signed   By: San Morelle M.D.   On: 09/01/2019 07:16   Portable Chest x-ray  Result Date: 08/31/2019 CLINICAL DATA:  Endotracheal tube present.  OG tube placement. EXAM: PORTABLE CHEST 1 VIEW COMPARISON:  Radiograph 08/29/2019 FINDINGS: Endotracheal tube tip 2 cm from the carina. Previous weighted enteric tube is been removed, and there is a new enteric tube with tip and side-port below the diaphragm. Right upper extremity central line tip in the SVC. Unchanged heart size and mediastinal contours. Progressive pulmonary edema from prior exam. Possible trace pleural effusions. No pneumothorax. IMPRESSION: 1. Endotracheal tube tip 2 cm from the carina. 2. Enteric tube tip and side-port below the diaphragm in the stomach. 3. Progressive pulmonary edema from prior exam. Electronically Signed   By: Keith Rake M.D.   On: 08/31/2019 00:22   DG CHEST PORT 1 VIEW  Result Date: 08/29/2019 CLINICAL DATA:  Pulmonary edema. EXAM: PORTABLE CHEST 1 VIEW COMPARISON:  08/28/2019.  08/27/2019.  11/29/2018. FINDINGS: Feeding tube and right PICC line stable position. Heart size normal.  Stable mild interstitial prominence. No alveolar infiltrate. No pleural effusion or pneumothorax. No acute bony abnormality. IMPRESSION: 1.  Lines and tubes in stable position. 2. Stable mild bilateral interstitial prominence. Again mild interstitial edema and/or pneumonitis cannot be excluded. Electronically Signed   By: Marcello Moores  Register   On: 08/29/2019 08:21   EEG adult  Result Date: 08/31/2019 Lora Havens, MD     08/31/2019  4:20 PM Patient Name: Donna [name] MRN: [id number] Epilepsy Attending: Lora Havens Referring Physician/Provider: Dr Valla Leaver Date: 08/31/2019 Duration: 24.33 mins Patient history: 69yo F with R MCA infarct and worsening ams. EEG to evaluate for seizure Level of alertness: lethargic AEDs during EEG study: None Technical aspects: This EEG study was done with scalp electrodes positioned according to the 10-20 International system of electrode placement. Electrical activity was acquired at a sampling rate of '500Hz'$  and reviewed with a high frequency filter of '70Hz'$  and a low frequency filter of '1Hz'$ . EEG data were recorded continuously and digitally stored. Description: No posterior dominant rhythm was seen. EEG showed continuous generalized and lateralized right hemisphere 3 to 6 Hz theta-delta slowing. Sharp transients were seen in left anterior -temporal region. Hyperventilation and photic stimulation were  not performed.   ABNORMALITY -Continuous slow, generalized and lateralized right hemisphere IMPRESSION: This study is suggestive of cortical dysfunction in right hemisphere most likely secondary to underlying infarct. There is also evidence of moderate diffuse encephalopathy, nonspecific etiology. No seizures or definite epileptiform discharges were seen throughout the recording. Priyanka O Yadav    Labs:  CBC: Recent Labs    08/27/19 0230 08/27/19 0529 08/28/19 0500 08/28/19 0505 08/28/19 1608 08/29/19 0457 08/31/19 0107 08/31/19 0216  WBC 11.6*  --  13.1*   --   --  10.2  --  18.4*  HGB 4.8*   < > 5.2*   < > 6.8* 4.7* 5.1* 5.0*  HCT 15.1*   < > 16.5*   < > 20.0* 15.6* 15.0* 16.8*  PLT 182  --  177  --   --  177  --  246   < > = values in this interval not displayed.    COAGS: Recent Labs    11/27/18 0728 08/18/19 0705 08/26/19 1715 08/27/19 0230  INR 0.9 0.9 1.1 1.2    BMP: Recent Labs    08/27/19 0230 08/27/19 0546 08/28/19 0500 08/28/19 0505 08/28/19 1608 08/29/19 0457 08/31/19 0107 08/31/19 0216  NA 141   < > 143   < > 146* 147* 150* 148*  K 3.5   < > 3.7   < > 3.3* 2.8* 4.6 4.5  CL 114*  --  111  --   --  112*  --  113*  CO2 19*  --  22  --   --  27  --  28  GLUCOSE 189*  --  316*  --   --  177*  --  326*  BUN 21  --  13  --   --  14  --  33*  CALCIUM 7.6*  --  8.3*  --   --  8.2*  --  8.5*  CREATININE 1.12*  --  0.96  --   --  0.87  --  0.93  GFRNONAA 50*  --  >60  --   --  >60  --  >60  GFRAA 58*  --  >60  --   --  >60  --  >60   < > = values in this interval not displayed.    LIVER FUNCTION TESTS: Recent Labs    05/22/19 1312 08/27/19 0230 08/28/19 0500 08/29/19 0457  BILITOT 0.3 0.1* 0.9 0.8  AST 15 21 29 22   ALT 19 21 24 21   ALKPHOS 74 36* 46 42  PROT 7.5 4.9* 5.7* 5.5*  ALBUMIN 4.0 2.4* 2.7* 2.4*    Assessment and Plan:  Right ICA cervical/petrous junction stenosis s/p revascularization using balloon angioplasty via right radial and right femoral approach6/14/2021by Dr. Estanislado Pandy. Patient intubated without sedation, opens eyes to name, follows simple commands of right side, with left-sided neglect. Right groin incision stable, no signs of active bleeding. Continue to hold Brilinta/Aspirin. Agree with CCM plans to trent labs QOD to avoid excess blood drawl. Further plans per CCM/neurology/vascular surgery- appreciate and agree with management. NIR to follow.   Electronically Signed: Earley Abide, PA-C 09/01/2019, 8:56 AM   I spent a total of 25 Minutes at the the patient's bedside AND  on the patient's hospital floor or unit, greater than 50% of which was counseling/coordinating care for right ICA stenosis s/p revascularization.

## 2019-09-01 NOTE — Evaluation (Signed)
Occupational Therapy Evaluation Patient Details Name: Donna [name] MRN: [id number] DOB: 14-Dec-1950 Today's Date: 09/01/2019    History of Present Illness 69 y.o. female with PMH significant for HTN, HLD, CVA 06/2018, DM, anemia, obesity, OSA, and OA. She is known to [facility] and has been followed by Dr. Corliss Skains since 10/2018. She first presented to our department at the request of Dr. Lucia Gaskins for management of intracranial stenosis, thought to be cause of recent CVA at that time. Pt underwent R ICA angioplasty on 6/14. Pt with decline in neuro status and hemoglobin on 6/15, found to have a large R groin hematoma.  Pt has had Hbg 5.0 or less and is a Jehovah's witness.  She was intubated on 6/19   Clinical Impression   Pt admitted with above. She demonstrates the below listed deficits and will benefit from continued OT to maximize safety and independence with BADLs.  Pt continues with bedrest orders,has Hbg 5.0, and is intubated.  She is not yet appropriate for full OT eval, but did assess her for splinting Lt UE.  She demonstrates no active movement of Lt UE as well as increased edema Lt hand.  Recommend resting hand splint to be worn 4hours on then 4 hours off.  Will establish splint schedule and monitor for tolerance.       Follow Up Recommendations  Other (comment) (To be determined )    Equipment Recommendations  None recommended by OT    Recommendations for Other Services       Precautions / Restrictions Precautions Precautions: Fall Precaution Comments: ETT      Mobility Bed Mobility               General bed mobility comments: did not attempt due to pt agitation and then lethargy   Transfers                 General transfer comment: unable to attempt     Balance                                           ADL either performed or assessed with clinical judgement   ADL                                         General ADL  Comments: pt currently requires total A      Vision   Additional Comments: unable to accurately assess this date      Perception     Praxis      Pertinent Vitals/Pain Pain Assessment: No/denies pain     Hand Dominance     Extremity/Trunk Assessment Upper Extremity Assessment Upper Extremity Assessment: LUE deficits/detail;RUE deficits/detail RUE Deficits / Details: moving spontaneously  LUE Deficits / Details: moderate edema noted.  No active movement noted.  PROM WFL  LUE Coordination: decreased gross motor;decreased fine motor   Lower Extremity Assessment Lower Extremity Assessment: Defer to PT evaluation       Communication Communication Communication: No difficulties   Cognition Arousal/Alertness: Awake/alert;Lethargic Behavior During Therapy: Agitated Overall Cognitive Status: Impaired/Different from baseline Area of Impairment: Following commands;Attention                   Current Attention Level: Focused   Following Commands: Follows one step commands  inconsistently       General Comments: Pt intubated, restrained with Lt UE and intubated.  Followed by lethargy.  She did follow 2 one step commands    General Comments  VSS     Exercises     Shoulder Instructions      Home Living Family/patient expects to be discharged to:: Private residence Living Arrangements: Children;Other relatives Available Help at Discharge: Family Type of Home: House Home Access: Level entry     Home Layout: One level     Bathroom Shower/Tub: Occupational psychologist: Standard     Home Equipment: None   Additional Comments: No family present. Unsure of accuracy.       Prior Functioning/Environment Level of Independence: Independent                 OT Problem List: Decreased strength;Decreased range of motion;Decreased activity tolerance;Impaired balance (sitting and/or standing);Decreased safety awareness;Decreased knowledge of use of DME  or AE;Cardiopulmonary status limiting activity;Impaired UE functional use;Increased edema      OT Treatment/Interventions: Self-care/ADL training;Neuromuscular education;Splinting;Patient/family education    OT Goals(Current goals can be found in the care plan section) Acute Rehab OT Goals OT Goal Formulation: Patient unable to participate in goal setting Time For Goal Achievement: 09/15/19 Potential to Achieve Goals: Good ADL Goals Additional ADL Goal #1: Pt will tolerate splint wear Lt UE  OT Frequency: Min 2X/week   Barriers to D/C:            Co-evaluation              AM-PAC OT "6 Clicks" Daily Activity     Outcome Measure Help from another person eating meals?: Total Help from another person taking care of personal grooming?: Total Help from another person toileting, which includes using toliet, bedpan, or urinal?: Total Help from another person bathing (including washing, rinsing, drying)?: Total Help from another person to put on and taking off regular upper body clothing?: Total Help from another person to put on and taking off regular lower body clothing?: Total 6 Click Score: 6   End of Session Equipment Utilized During Treatment: Oxygen  Activity Tolerance: Patient limited by lethargy;Other (comment) (agitation ) Patient left: in bed;with restraints reapplied  OT Visit Diagnosis: Muscle weakness (generalized) (M62.81);Cognitive communication deficit (R41.841);Hemiplegia and hemiparesis Symptoms and signs involving cognitive functions: Cerebral infarction Hemiplegia - Right/Left: Left Hemiplegia - dominant/non-dominant: Non-Dominant Hemiplegia - caused by: Cerebral infarction                Time: 9371-6967 OT Time Calculation (min): 8 min Charges:  OT General Charges $OT Visit: 1 Visit OT Evaluation $OT Eval Moderate Complexity: 1 Mod  Nilsa Nutting., OTR/L Acute Rehabilitation Services Pager [phone] Office [phone]   Lucille Passy  M 09/01/2019, 3:28 PM

## 2019-09-02 LAB — GLUCOSE, CAPILLARY
Glucose-Capillary: 196 mg/dL — ABNORMAL HIGH (ref 70–99)
Glucose-Capillary: 186 mg/dL — ABNORMAL HIGH (ref 70–99)
Glucose-Capillary: 92 mg/dL (ref 70–99)
Glucose-Capillary: 187 mg/dL — ABNORMAL HIGH (ref 70–99)
Glucose-Capillary: 189 mg/dL — ABNORMAL HIGH (ref 70–99)
Glucose-Capillary: 256 mg/dL — ABNORMAL HIGH (ref 70–99)

## 2019-09-02 LAB — CULTURE, RESPIRATORY W GRAM STAIN

## 2019-09-02 MED ORDER — ETOMIDATE 2 MG/ML IV SOLN
INTRAVENOUS | Status: AC
  Filled 2019-09-02: qty 20

## 2019-09-02 MED ORDER — FUROSEMIDE 10 MG/ML IJ SOLN
40.0000 mg | Freq: Once | INTRAMUSCULAR | Status: AC
  Administered 2019-09-02: 40 mg via INTRAVENOUS
  Filled 2019-09-02: qty 4

## 2019-09-02 NOTE — Progress Notes (Signed)
Referring Physician(s): Melvenia Beam  Supervising Physician: Luanne Bras  Patient Status:  [facility] - In-pt  Chief Complaint: None- intubated with sedation  Subjective:  Right ICA cervical/petrous junction stenosis s/p revascularization using balloon angioplasty via right radial and right femoral approach6/14/2021by Dr. Estanislado Pandy. Patient intubated with sedation. She does not respond to voice and does not follow simple commands. Can spontaneously move right side with left-sided neglect. Right groin and right radial puncture sites c/d/i. Most recent hgb 08/31/2019 stable at 5.0. No family at bedside this AM.   Allergies: Patient has no known allergies.  Medications: Prior to Admission medications   Medication Sig Start Date End Date Taking? Authorizing Provider  amLODipine (NORVASC) 10 MG tablet Take 1 tablet (10 mg total) by mouth daily. 05/12/19  Yes Minette Brine, FNP  aspirin EC 81 MG tablet Take 81 mg by mouth every evening.   Yes [provider]  Calcium Carbonate-Vitamin D (CALCIUM-D PO) Take 2 tablets by mouth daily at 12 noon.    Yes [provider]  carvedilol (COREG) 6.25 MG tablet TAKE 1 TABLET(6.25 MG) BY MOUTH TWICE DAILY Patient taking differently: Take 6.25 mg by mouth 2 (two) times daily with a meal.  07/31/19  Yes Minette Brine, FNP  hydrALAZINE (APRESOLINE) 50 MG tablet Take 1 tablet (50 mg total) by mouth 2 (two) times daily. 06/16/19 09/14/19 Yes Elouise Munroe, MD  Insulin Degludec-Liraglutide (XULTOPHY) 100-3.6 UNIT-MG/ML SOPN Inject 30 Units into the skin daily. 08/14/19  Yes Minette Brine, FNP  telmisartan-hydrochlorothiazide (MICARDIS HCT) 40-12.5 MG tablet Take 1 tablet by mouth daily. 07/31/19  Yes Minette Brine, FNP  ticagrelor (BRILINTA) 90 MG TABS tablet Take 1 tablet (90 mg total) by mouth 2 (two) times daily. 06/12/19  Yes Elouise Munroe, MD  vitamin C (ASCORBIC ACID) 250 MG tablet Take 500 mg by mouth daily at 12 noon.   Yes  [provider]  blood glucose meter kit and supplies KIT Dispense based on patient and insurance preference. Use up to four times daily as directed. (FOR ICD-9 250.00, 250.01). 05/10/18   Minette Brine, FNP  Blood Glucose Monitoring Suppl (TRUE METRIX METER) w/Device KIT 1 each by Does not apply route in the morning, at noon, in the evening, and at bedtime. 05/22/19   Minette Brine, FNP  Evolocumab (REPATHA SURECLICK) 100 MG/ML SOAJ Inject 140 mg into the skin every 14 (fourteen) days. 06/30/19   Elouise Munroe, MD  glucose blood (TRUE METRIX BLOOD GLUCOSE TEST) test strip Check blood sugar 4 times a day before meals and bedtime 05/22/19   Minette Brine, FNP  Insulin Glargine-Lixisenatide (SOLIQUA) 100-33 UNT-MCG/ML SOPN Inject 25 Units into the skin daily. Patient taking differently: Inject 30 Units into the skin daily.  07/14/19   Minette Brine, FNP  Multiple Vitamin (MULTIVITAMIN PO) Take 1 tablet by mouth daily at 12 noon.     [provider]  Omega-3 Fatty Acids (OMEGA-3 PLUS PO) Take 2 capsules by mouth daily at 12 noon.    [provider]     Vital Signs: BP (!) 147/57   Pulse 75   Temp (!) 101.4 F (38.6 C) (Axillary)   Resp 18   Ht _0  (1.549 m)   Wt 170 lb 13.7 oz (77.5 kg)   SpO2 100%   BMI 32.28 kg/m   Physical Exam Vitals and nursing note reviewed.  Constitutional:      General: She is not in acute distress.    Comments:  Intubated with sedation.  Cardiovascular:     Pulses: Normal pulses.     Heart sounds: No murmur heard.      Comments: Intubated with sedation. Skin:    General: Skin is warm and dry.     Comments: Right groin puncture site soft without active bleeding or hematoma. Right radial puncture site soft without active bleeding or hematoma.   Neurological:     Comments: Intubated with sedation. She does not respond to voice and does not follows simple commands of right side. PERRL bilaterally. Right gaze preference. Can  spontaneously move right side but no spontaneous movements of left side (left-sided neglect). Distal pulses (DPs) 1+ bilaterally.       Imaging: DG Chest Port 1 View  Result Date: 09/01/2019 CLINICAL DATA:  Acute respiratory failure. EXAM: PORTABLE CHEST 1 VIEW COMPARISON:  One-view chest x-ray 08/29/2019 FINDINGS: Endotracheal tube is stable. NG tube courses off the inferior border the film. Right-sided PICC line is stable. Heart size is exaggerated by low lung volumes. Atherosclerotic changes are noted at the aortic arch. Moderate pulmonary vascular congestion has increased slightly. Bibasilar airspace opacities likely reflect atelectasis. Atherosclerotic calcifications are noted in the neck. IMPRESSION: 1. Slight increase in moderate pulmonary vascular congestion. 2. Bibasilar airspace disease likely reflects atelectasis. Electronically Signed   By: San Morelle M.D.   On: 09/01/2019 07:16   Portable Chest x-ray  Result Date: 08/31/2019 CLINICAL DATA:  Endotracheal tube present.  OG tube placement. EXAM: PORTABLE CHEST 1 VIEW COMPARISON:  Radiograph 08/29/2019 FINDINGS: Endotracheal tube tip 2 cm from the carina. Previous weighted enteric tube is been removed, and there is a new enteric tube with tip and side-port below the diaphragm. Right upper extremity central line tip in the SVC. Unchanged heart size and mediastinal contours. Progressive pulmonary edema from prior exam. Possible trace pleural effusions. No pneumothorax. IMPRESSION: 1. Endotracheal tube tip 2 cm from the carina. 2. Enteric tube tip and side-port below the diaphragm in the stomach. 3. Progressive pulmonary edema from prior exam. Electronically Signed   By: Keith Rake M.D.   On: 08/31/2019 00:22   EEG adult  Result Date: 08/31/2019 Lora Havens, MD     08/31/2019  4:20 PM Patient Name: Donna [name] MRN: [id number] Epilepsy Attending: Lora Havens Referring Physician/Provider: Dr Valla Leaver Date:  08/31/2019 Duration: 24.33 mins Patient history: 69yo F with R MCA infarct and worsening ams. EEG to evaluate for seizure Level of alertness: lethargic AEDs during EEG study: None Technical aspects: This EEG study was done with scalp electrodes positioned according to the 10-20 International system of electrode placement. Electrical activity was acquired at a sampling rate of _0  and reviewed with a high frequency filter of _1  and a low frequency filter of _2 . EEG data were recorded continuously and digitally stored. Description: No posterior dominant rhythm was seen. EEG showed continuous generalized and lateralized right hemisphere 3 to 6 Hz theta-delta slowing. Sharp transients were seen in left anterior -temporal region. Hyperventilation and photic stimulation were not performed.   ABNORMALITY -Continuous slow, generalized and lateralized right hemisphere IMPRESSION: This study is suggestive of cortical dysfunction in right hemisphere most likely secondary to underlying infarct. There is also evidence of moderate diffuse encephalopathy, nonspecific etiology. No seizures or definite epileptiform discharges were seen throughout the recording. [place]:  CBC: Recent Labs    08/27/19 0230 08/27/19 0529 08/28/19 0500 08/28/19 0505 08/28/19 1608 08/29/19 4010 08/31/19 0107 08/31/19 0216  WBC 11.6*  --  13.1*  --   --  10.2  --  18.4*  HGB 4.8*   < > 5.2*   < > 6.8* 4.7* 5.1* 5.0*  HCT 15.1*   < > 16.5*   < > 20.0* 15.6* 15.0* 16.8*  PLT 182  --  177  --   --  177  --  246   < > = values in this interval not displayed.    COAGS: Recent Labs    11/27/18 0728 08/18/19 0705 08/26/19 1715 08/27/19 0230  INR 0.9 0.9 1.1 1.2    BMP: Recent Labs    08/27/19 0230 08/27/19 0546 08/28/19 0500 08/28/19 0505 08/28/19 1608 08/29/19 0457 08/31/19 0107 08/31/19 0216  NA 141   < > 143   < > 146* 147* 150* 148*  K 3.5   < > 3.7   < > 3.3* 2.8* 4.6 4.5  CL 114*  --  111   --   --  112*  --  113*  CO2 19*  --  22  --   --  27  --  28  GLUCOSE 189*  --  316*  --   --  177*  --  326*  BUN 21  --  13  --   --  14  --  33*  CALCIUM 7.6*  --  8.3*  --   --  8.2*  --  8.5*  CREATININE 1.12*  --  0.96  --   --  0.87  --  0.93  GFRNONAA 50*  --  >60  --   --  >60  --  >60  GFRAA 58*  --  >60  --   --  >60  --  >60   < > = values in this interval not displayed.    LIVER FUNCTION TESTS: Recent Labs    05/22/19 1312 08/27/19 0230 08/28/19 0500 08/29/19 0457  BILITOT 0.3 0.1* 0.9 0.8  AST _0 ALT _1 ALKPHOS 74 36* 46 42  PROT 7.5 4.9* 5.7* 5.5*  ALBUMIN 4.0 2.4* 2.7* 2.4*    Assessment and Plan:  Right ICA cervical/petrous junction stenosis s/p revascularization using balloon angioplasty via right radial and right femoral approach6/14/2021by Dr. Estanislado Pandy. Patient intubated with sedation (weaning), does not follow simple commands, has spontaneous movements of right side but no spontaneous movements of left side. Right groin incision stable, no signs of active bleeding. For possible extubation today. Continue to hold Brilinta/Aspirin. Agree with CCM plans to trent labs QOD to avoid excess blood drawl. Further plans per CCM/neurology/vascular surgery- appreciate and agree with management. NIR to follow.   Electronically Signed: Earley Abide, PA-C 09/02/2019, 9:06 AM   I spent a total of 25 Minutes at the the patient's bedside AND on the patient's hospital floor or unit, greater than 50% of which was counseling/coordinating care for right ICA stenosis s/p revascularization.

## 2019-09-02 NOTE — Progress Notes (Signed)
Occupational Therapy Treatment Patient Details Name: Donna [name] MRN: [id number] DOB: May 16, 1950 Today's Date: 09/02/2019    History of present illness 69 y.o. female with PMH significant for HTN, HLD, CVA 06/2018, DM, anemia, obesity, OSA, and OA. She is known to [facility] and has been followed by Dr. Corliss Skains since 10/2018. She first presented to our department at the request of Dr. Lucia Gaskins for management of intracranial stenosis, thought to be cause of recent CVA at that time. Pt underwent R ICA angioplasty on 6/14. Pt with decline in neuro status and hemoglobin on 6/15, found to have a large R groin hematoma.  Pt has had Hbg 5.0 or less and is a Jehovah's witness.  She was intubated on 6/19   OT comments  Splint appears to be fitting well with no evidence of pressure, increased edema, or pain.  Splint schedule established and posted in room - pt to wear splint on for 4 hours; off for 4 hours.  Will continue to follow.   Follow Up Recommendations  Other (comment) (To be determined )    Equipment Recommendations  None recommended by OT    Recommendations for Other Services      Precautions / Restrictions Precautions Precautions: Fall Precaution Comments: ETT       Mobility Bed Mobility                  Transfers                      Balance                                           ADL either performed or assessed with clinical judgement   ADL                                               Vision       Perception     Praxis      Cognition Arousal/Alertness: Lethargic                                     General Comments: Pt lethargic and sleeping during splint check         Exercises Other Exercises Other Exercises: Splint removed.  No signs of redness/pressure.  Edema mildly decreased.   Splint schedule posted in room and nsg informed    Shoulder Instructions       General Comments       Pertinent Vitals/ Pain       Pain Assessment: No/denies pain  Home Living                                          Prior Functioning/Environment              Frequency  Min 2X/week        Progress Toward Goals  OT Goals(current goals can now be found in the care plan section)  Progress towards OT goals: Progressing toward goals     Plan Discharge plan remains appropriate  Co-evaluation                 AM-PAC OT "6 Clicks" Daily Activity     Outcome Measure   Help from another person eating meals?: Total Help from another person taking care of personal grooming?: Total Help from another person toileting, which includes using toliet, bedpan, or urinal?: Total Help from another person bathing (including washing, rinsing, drying)?: Total Help from another person to put on and taking off regular upper body clothing?: Total Help from another person to put on and taking off regular lower body clothing?: Total 6 Click Score: 6    End of Session Equipment Utilized During Treatment: Oxygen  OT Visit Diagnosis: Muscle weakness (generalized) (M62.81);Cognitive communication deficit (R41.841);Hemiplegia and hemiparesis Symptoms and signs involving cognitive functions: Cerebral infarction Hemiplegia - Right/Left: Left Hemiplegia - dominant/non-dominant: Non-Dominant Hemiplegia - caused by: Cerebral infarction   Activity Tolerance Patient tolerated treatment well   Patient Left in bed;with call bell/phone within reach;with restraints reapplied   Nurse Communication Other (comment) (splint schedule )        Time: 3419-6222 OT Time Calculation (min): 11 min  Charges: OT General Charges $OT Visit: 1 Visit OT Treatments $Orthotics/Prosthetics Check: 8-22 mins  Nilsa Nutting OTR/L Acute Rehabilitation Services Pager [phone] Office [place], [place] 09/02/2019, 4:08 PM

## 2019-09-02 NOTE — Progress Notes (Signed)
NAME:  Donna [name], MRN:  [id number], DOB:  [birth year], LOS: 37 ADMISSION DATE:  08/25/2019, CONSULTATION DATE:  08/27/2019 REFERRING MD:  Dr. 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  Was placed on ASA/ brillinta.  Monitored in Neuro ICU.  Noted to have developed hypotension, increased left sided weakness and lethargy on 6/15 am with Hgb drop from 13.3 to 6.9 with new AKI.  Post MRI/ MRA showed scattered small acute infarcts along the right MCA/ watershed territory suspected to be resultant either post procedure vs hypoperfusion.  Neurology was consulted with recommendations to keep SBP > 140 for cerebral perfusion.  A CT abd/ pelvis was obtained given concern for retroperitoneal bleed which showed a mild to moderate amount of retroperitoneal hematoma in the right iliac and inguinal regiones with moderate size hematoma noted the the soft tissues anterior to the musculature of the right hip; no definite intrapertioneal hemorrhage. INR, PT, and platelets rechecked and were wnl.  Vascular surgery was consulted with plans to observe given bleeding was mostly into the muscle and tissue.  She was started on neosynephrine to keep SBP goal > 140-160 for cerebral perfusion.  PCCM consulted to help with vasopressor support.  S/p 1L bolus  Repeat Hgb now 6.9-> 7-> 5.1.   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    ETT 6/19 >>  Significant Diagnostic Tests:   6/15 MRA/ MRI brain  >> 1. Scattered small acute infarcts along the right MCA/watershed territory. Solitary small acute infarct in the left parietal cortex and right cerebellum. 2. Improved right ICA patency at the petrous segment. 3. Known severe right M1 segment stenosis. 4. 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  6/15 CT a/p wo contrast >> 1. Mild to moderate amount of retroperitoneal hematoma is noted in the right iliac and inguinal regions. Moderate size hematoma is noted in the soft tissues anterior to the musculature of the right hip. No definite intraperitoneal hemorrhage is noted. 2. Moderate size fat containing periumbilical hernia. Aortic Atherosclerosis   Echo 6/16 >> normal LVEF   Micro Data:  6/10 SARS 2 >> neg 6/14 MRSA PCR >> neg  Antimicrobials:  6/14 cefazolin pre-op  6/20 unasyn >> 09/02/2019  Interim history/subjective:   No issues overnight. Good urine output  Objective   Blood pressure (!) 147/57, pulse 75, temperature 99 F (37.2 C), temperature source Axillary, resp. rate 18, height 5\' 1"  (1.549 m), weight 77.5 kg, SpO2 100 %.    Vent Mode: PSV;CPAP FiO2 (%):  [40 %] 40 % Set Rate:  [15 bmp] 15 bmp Vt Set:  [410 mL] 410 mL PEEP:  [5 cmH20] 5 cmH20 Pressure Support:  [8 cmH20] 8 cmH20 Plateau Pressure:  [12 cmH20-15 cmH20] 15 cmH20  Intake/Output Summary (Last 24 hours) at 09/02/2019 0816 Last data filed at 09/02/2019 0536 Gross per 24 hour  Intake 630.8 ml  Output 2500 ml  Net -1869.2 ml   Filed Weights   08/25/19 0705 08/30/19 0500  Weight: 68.5 kg 77.5 kg    Examination:  General: elderly obese fm, intubated on mv  HEENT:  NCAT tracking appropriately  Neck: no jvd  Lungs: BL vented breaths  Heart: RRR, s1 s2  Abdomen: soft nt nd  Neuro: off sedation following commands  Extremities: soft left leg  Chest x-ray 09/01/2019: Bilateral pulmonary vascular congestion Last labs were from 08/31/2019: Repeats planning for tomorrow.  Trying to hold off on excess blood draws   Resolved Hospital Problem list   Hypotension   Assessment & Plan:   s/p RT ICA cer/petrous junction balloon angioplasty for severe stenosis. Post-procedure with residual stenosis of approx 25 %. Severe stenosis of RT MCA M1 seg with improved flow post angioplasty Right MCA watershed territory infarct Small left frontal cortex and right cerebellum  P:  -per neuro IR   Acute respiratory failure with hypoxia - -CXR with pulm edema, mild BUL opacities  -Mild upper airway pseudowheeze 6/18, diuresed and given Solu-Medrol for stridor -Reintubated for acute hypercarbic respiratory failure 6/20 with bilateral infiltrates?  Aspiration versus edema , favor aspiration given leukocytosis P -completed course of unasyn  - diuresis again today X1  - CXR prn   Acute metabolic encephalopathy - -in setting of CNS depressing medications, R MCA CVA P - hold sedation  - SBT SAT this AM - Possible extubation  - Discussed with nursing this AM   Acute blood loss anemia - Jehovah's Witness: no blood products including albumin - CTA Abd/Pelvis 08/26/19 with hematoma of the right groin and retroperitoneum with expansion to the right rectus femoris expansion. No active bleeding S/p -  feraheme x2 doses P - labs every other day  - avoid excess blood draws   AKI -resolved  DM, type II - uncontrolled P:  lantus + SSI with CBGS   Hypokalemia and hypophosphatemia P: -replete as needed   Best practice:  Diet: NPO  Pain/Anxiety/Delirium protocol (if indicated): n/a VAP protocol (if indicated): n/a DVT prophylaxis: SCDs only in setting of acute  anemia/blood loss GI prophylaxis: Pepcid Glucose control: CBG q 4 Mobility: BR Code Status: Full  Family Communication: - son, Evaristo Bury at [phone]  Disposition: ICU   This patient is critically ill with multiple organ system failure; which, requires frequent high complexity decision making, assessment, support, evaluation, and titration of therapies. This was completed through the application of advanced monitoring technologies and extensive interpretation of multiple databases. During this encounter critical care time was devoted to patient care services described in this note for 32 minutes.  Josephine Igo, DO [facility] 09/02/2019 8:17 AM

## 2019-09-02 NOTE — Progress Notes (Signed)
STROKE TEAM PROGRESS NOTE   INTERVAL HISTORY  She remains intubated and on ventilatory support for respiratory failure..  Neurologically seems unchanged .arousable and follows commands.  She is being weaned off vent  support with plans to extubate later today if tolerated.  Vital signs are stable.  T-max 100.4 Vitals:   09/02/19 1500 09/02/19 1600 09/02/19 1613 09/02/19 1614  BP: (!) 133/47 (!) 128/49 (!) 128/49   Pulse: 84 69 72   Resp: 15 16 15    Temp:  (!) 100.4 F (38 C)    TempSrc:  Axillary    SpO2: 100% 100% 100% 100%  Weight:      Height:       CBC:  Recent Labs  Lab 08/29/19 0457 08/29/19 0457 08/31/19 0107 08/31/19 0216  WBC 10.2  --   --  18.4*  HGB 4.7*   < > 5.1* 5.0*  HCT 15.6*   < > 15.0* 16.8*  MCV 98.1  --   --  103.7*  PLT 177  --   --  246   < > = values in this interval not displayed.   Basic Metabolic Panel:  Recent Labs  Lab 08/29/19 0457 08/29/19 0457 08/31/19 0107 08/31/19 0216  NA 147*   < > 150* 148*  K 2.8*   < > 4.6 4.5  CL 112*  --   --  113*  CO2 27  --   --  28  GLUCOSE 177*  --   --  326*  BUN 14  --   --  33*  CREATININE 0.87  --   --  0.93  CALCIUM 8.2*  --   --  8.5*  MG 2.0  --   --  2.4  PHOS 1.7*  --   --  4.0   < > = values in this interval not displayed.   Lipid Panel:     Component Value Date/Time   CHOL 217 (H) 05/22/2019 1312   TRIG 114 05/22/2019 1312   HDL 65 05/22/2019 1312   CHOLHDL 3.3 05/22/2019 1312   LDLCALC 132 (H) 05/22/2019 1312   HgbA1c:  Lab Results  Component Value Date   HGBA1C 11.2 (H) 05/22/2019   Urine Drug Screen: No results found for: LABOPIA, COCAINSCRNUR, LABBENZ, AMPHETMU, THCU, LABBARB  Alcohol Level No results found for: ETH  IMAGING past 24 hours No results found.  PHYSICAL EXAM    She is intubated.  And on ventilatory support  Temp:  [98.8 F (37.1 C)-101.4 F (38.6 C)] 100.4 F (38 C) (06/22 1600) Pulse Rate:  [65-93] 72 (06/22 1613) Resp:  [15-27] 15 (06/22 1613) BP:  (128-147)/(45-73) 128/49 (06/22 1613) SpO2:  [100 %] 100 % (06/22 1614) FiO2 (%):  [40 %] 40 % (06/22 1614)  General -middle-aged African-American lady who is intubated  Ophthalmologic - fundi not visualized due to noncooperation.  Cardiovascular - Regular rhythm and rate.  Neuro -drowsy she does open her eyes to sternal rub but does   follow only occasional right sided body commands.  Eyes are midline.  Pupils are pinpoint.  With forced eye opening, right gaze preference, not cross midline. Left neglect, not blinking to visual threat on the left. PERRL. Left facial droop. Left UE 0/5 and LLE 0/5 with pain stimulation. RUE 3/5 at least and RLE 2/5 with pain. Sensation, coordination not cooperative and gait not tested.   ASSESSMENT/PLAN [name] is a 69 y.o. female with history of right MCA territory stroke  with residual mild left hemiparesis, multiple intracranial stenosis including those of anterior and posterior circulation, sleep apnea, hypertension, hyperlipidemia, diabetes s/p R cervical ICA/P2 jxn angioplasty 08/25/19 who post-procedure developed worsening L sided weakness and neglect. MRI with scattered R MCA watershed infarcts, L frontal cortex and R cerebellar infarcts.   Intracranial Stenoses s/p angioplasty History of stroke  06/2018 R lentiform nucleus, external and extreme capsule infarct. mild periventricular small vessel disease. multiple uncontrolled risk factors. LDL 131 and A1C 11.4. OP w/u. Put on plavix but non-compliant. Intolerant to statins.   Found to have severe intracranial stenoses on MRA - referred to Dr. Corliss Skains.    10/2018 cerebral angio - 85% stenosis of the mid M1 segment of the right MCA. Severe high-grade approximately 90% stenosis of left VBJ, and of 60% just distal to the origin of the AICA. Approximately 50% stenosis at the origin of the right ICA at the bulb, and also about 60% of the right ICA at the cervical petrous junction.   Plan for 11/2018  procedure but P2Y12 was high, plavix changed to brilinta and procedure postponed.   08/25/2019 (Dr. Corliss Skains) S/p R cervical ICA/Petrous jxn angioplasty 08/25/19 ->25% residual stenosis. Severe stenosis of RT MCA M 1 seg with improved flow post angioplasty.    post-procedure worsening L sided weakness and neglect  MRI with scattered R MCA watershed infarcts, L frontal cortex and R cerebellar infarcts.   As needed labetalol started for persistent elevated blood pressure.  Severe Acute Blood Loss Anemia - Hematoma R Thigh and retroperitoneal hematoma  CT abd/pelvis 6/15 large R groin retroperitoneal hematoma iliac & inguinal mostly in muscle and soft tissue  Hgb 13.3->6.9->7->5.1->4.8->5.0->5.2->4.7->5.1->5.0  P2Y12 164  On Aranesp / Feraheme   VVS consult (Dr Edilia Bo) - no procedure needed  Pt is a Jehovah's Witness - refuses all blood products including albumin, son confirmed  Aspirin and brilinta now on hold  Not safe for wound exploration d/t risk of additional blood loss  Hgb drop with dilutional component d/t increased fluids to maintain stent patency - now getting NS at 25 cc / hr ; Jevity 50 cc / hr  Femstop considered, not felt to be actively bleeding  Repeat CT abd/pelvis 6/16 no active bleeding  Treated with IV iron/erythropoietin following consult w/ [facility]  Limit blood draws where possible   Stroke:   R MCA scattered watershed, L frontal and R cerebellar infarcts following R ICA/P2 jxn angioplasty   MRI  Scattered R MCA/watershed infarcts. L parietal cortex infarct. R cerebellar infarct.  MRA   Improved R ICA patency. Severe R M1 stenosis. High-grade L V4 and moderate mid BA stenosis.   CT head No progression of infarcts or hemorrhage  2D echo EF 60-65%. No source of embolus  aspirin 81 mg daily and Brilinta (ticagrelor) 90 mg bid prior to admission, continue hold given hmg   Therapy recommendations:  not medically ready for  therapy  Disposition:  pending   Acute Respiratory distress   Increasing O2 demand  Received one dose of lasix 6/16  On low dose precedex -> off now   CCM on board  Currently intubated.  We will try to reduce sedation as much as possible.  An EEG was obtained to evaluate for possible seizures for the acute respiratory changes.  Hypotension  BP goal 120-160  PICC line placed  Treated w/ Max neo -> now off  BP now ~120 (SBP 115->131)  CCM onboard   . Long-term BP  goal 130-150 given intracranial stenoses  Hyperlipidemia  Home meds:  Repatha & fish oil  Intolerant to statins  LDL 132 in March, goal < 70  Continue Repatha at discharge  Diabetes type II Uncontrolled  HgbA1c 11.2 in March, goal < 7.0  CBGs  SSI  Hyperglycemia - 326 (SSI and scheduled insulin) continue to monitor on tube feeds  Dysphagia . Secondary to stroke . NPO . Cortrak TF @ 50 & IVF @ 25 . Speech on board   Hypokalemia  Potassium - 4.5->3.5->3.4->3.7->3.5->3.3 ->2.8->4.6->4.5  Pt had total of 160 meq KCL (Cortrak) yesterday plus 30 mmol potassium phosphate  Lasix 20 mg IV yesterday   Hypophosphatemia - 1.7->4.0  Limit blood draws where possible due to anemia  Fever  Tmax - 110.9->101.7->99.5->99.4 ax  Leukocytosis 11.8->11.4->11.6->13.1->10.2->18.4  Currently no antibiotics  Solumedrol 60 mg IV x 1 on 08/29/19   Blood cultures - 6/18 - no growth x 2 days - final pending   CXR 08/29/19 - Stable mild bilateral interstitial prominence. Again mild interstitial edema and/or pneumonitis cannot be excluded.  U/A - 6/19 - rare bacteria - not c/w UTI  Limit blood draws where possible due to anemia  Other Stroke Risk Factors  Advanced age  Overweight, Body mass index is 32.28 kg/m., recommend weight loss, diet and exercise as appropriate   Hx stroke/TIA (see above)  Family hx stroke (mother)  Snoring. OP sleep eval showed mild Obstructive sleep apnea overall but severe  apnea during REM w/ CPAP recommended   Other Active Problems  Hx noncompliance  Metabolic enceaphalopath  AKI pre-renal in setting of anemia 0.93->1.78->1.12->0.96->0.87->0.93  Hx palpitations on coreg, event monitor ordered 09/2018 but never completed   Code status - full code  [facility] day # 8 Continue ventilatory support for respiratory failure as per CCM team and trial of weaning if tolerated and extubate and Unasyn for presumed aspiration pneumonia.  Neurological exam remains unchanged  .  Discussed with Dr.Icard critical care team.  No family available for discussion.  This patient is critically ill due to stroke post procedure, severe anemia, hematoma on the thigh, hypotension and at significant risk of neurological worsening, death form severe anemia, bleeding, recurrent stroke, intracranial stenosis. This patient's care requires constant monitoring of vital signs, hemodynamics, respiratory and cardiac monitoring, review of multiple databases, neurological assessment, other specialists and medical decision making of high complexity. I spent18minutes of neurocritical care time in the care of this patient  Antony Contras, MD   To contact Stroke Continuity provider, please refer to [email or web address] After hours, contact General Neurology

## 2019-09-03 ENCOUNTER — Inpatient Hospital Stay (HOSPITAL_COMMUNITY): Payer: Medicare HMO

## 2019-09-03 LAB — SODIUM: Sodium: 158 mmol/L — ABNORMAL HIGH (ref 135–145)

## 2019-09-03 LAB — CBC
HCT: 18.5 % — ABNORMAL LOW (ref 36.0–46.0)
Hemoglobin: 5.1 g/dL — CL (ref 12.0–15.0)
MCH: 30.4 pg (ref 26.0–34.0)
MCHC: 27.6 g/dL — ABNORMAL LOW (ref 30.0–36.0)
MCV: 110.1 fL — ABNORMAL HIGH (ref 80.0–100.0)
Platelets: 229 10*3/uL (ref 150–400)
RBC: 1.68 MIL/uL — ABNORMAL LOW (ref 3.87–5.11)
RDW: 19.2 % — ABNORMAL HIGH (ref 11.5–15.5)
WBC: 9.1 10*3/uL (ref 4.0–10.5)
nRBC: 4.1 % — ABNORMAL HIGH (ref 0.0–0.2)

## 2019-09-03 LAB — GLUCOSE, CAPILLARY
Glucose-Capillary: 112 mg/dL — ABNORMAL HIGH (ref 70–99)
Glucose-Capillary: 201 mg/dL — ABNORMAL HIGH (ref 70–99)
Glucose-Capillary: 257 mg/dL — ABNORMAL HIGH (ref 70–99)
Glucose-Capillary: 140 mg/dL — ABNORMAL HIGH (ref 70–99)
Glucose-Capillary: 102 mg/dL — ABNORMAL HIGH (ref 70–99)
Glucose-Capillary: 170 mg/dL — ABNORMAL HIGH (ref 70–99)
Glucose-Capillary: 115 mg/dL — ABNORMAL HIGH (ref 70–99)
Glucose-Capillary: 117 mg/dL — ABNORMAL HIGH (ref 70–99)

## 2019-09-03 LAB — POCT I-STAT 7, (LYTES, BLD GAS, ICA,H+H)
Acid-Base Excess: 13 mmol/L — ABNORMAL HIGH (ref 0.0–2.0)
Bicarbonate: 38 mmol/L — ABNORMAL HIGH (ref 20.0–28.0)
Calcium, Ion: 1.3 mmol/L (ref 1.15–1.40)
HCT: 15 % — ABNORMAL LOW (ref 36.0–46.0)
Hemoglobin: 5.1 g/dL — CL (ref 12.0–15.0)
O2 Saturation: 96 %
Patient temperature: 99.9
Potassium: 3.4 mmol/L — ABNORMAL LOW (ref 3.5–5.1)
Sodium: 159 mmol/L — ABNORMAL HIGH (ref 135–145)
TCO2: 40 mmol/L — ABNORMAL HIGH (ref 22–32)
pCO2 arterial: 60.9 mmHg — ABNORMAL HIGH (ref 32.0–48.0)
pH, Arterial: 7.407 (ref 7.350–7.450)
pO2, Arterial: 87 mmHg (ref 83.0–108.0)

## 2019-09-03 LAB — CULTURE, BLOOD (ROUTINE X 2)
Culture: NO GROWTH
Culture: NO GROWTH
Special Requests: ADEQUATE
Special Requests: ADEQUATE

## 2019-09-03 LAB — COMPREHENSIVE METABOLIC PANEL
ALT: 17 U/L (ref 0–44)
AST: 23 U/L (ref 15–41)
Albumin: 2.2 g/dL — ABNORMAL LOW (ref 3.5–5.0)
Alkaline Phosphatase: 58 U/L (ref 38–126)
Anion gap: 10 (ref 5–15)
BUN: 23 mg/dL (ref 8–23)
CO2: 35 mmol/L — ABNORMAL HIGH (ref 22–32)
Calcium: 9.2 mg/dL (ref 8.9–10.3)
Chloride: 117 mmol/L — ABNORMAL HIGH (ref 98–111)
Creatinine, Ser: 0.9 mg/dL (ref 0.44–1.00)
GFR calc Af Amer: >60 mL/min (ref >60)
GFR calc non Af Amer: >60 mL/min (ref >60)
Glucose, Bld: 111 mg/dL — ABNORMAL HIGH (ref 70–99)
Potassium: 3.6 mmol/L (ref 3.5–5.1)
Sodium: 162 mmol/L (ref 135–145)
Total Bilirubin: 0.8 mg/dL (ref 0.3–1.2)
Total Protein: 6.3 g/dL — ABNORMAL LOW (ref 6.5–8.1)

## 2019-09-03 MED ORDER — ORAL CARE MOUTH RINSE
15.0000 mL | OROMUCOSAL | Status: DC
  Administered 2019-09-03 – 2019-09-15 (×112): 15 mL via OROMUCOSAL

## 2019-09-03 MED ORDER — CHLORHEXIDINE GLUCONATE 0.12% ORAL RINSE (MEDLINE KIT)
15.0000 mL | Freq: Two times a day (BID) | OROMUCOSAL | Status: DC
  Administered 2019-09-03 – 2019-09-15 (×24): 15 mL via OROMUCOSAL

## 2019-09-03 MED ORDER — INSULIN GLARGINE 100 UNIT/ML ~~LOC~~ SOLN
20.0000 [IU] | Freq: Every day | SUBCUTANEOUS | Status: DC
  Administered 2019-09-03 – 2019-09-13 (×11): 20 [IU] via SUBCUTANEOUS
  Filled 2019-09-03 (×12): qty 0.2

## 2019-09-03 MED ORDER — INSULIN ASPART 100 UNIT/ML ~~LOC~~ SOLN
8.0000 [IU] | SUBCUTANEOUS | Status: DC
  Administered 2019-09-03 – 2019-09-05 (×10): 8 [IU] via SUBCUTANEOUS

## 2019-09-03 MED ORDER — FREE WATER
200.0000 mL | Status: DC
  Administered 2019-09-03 – 2019-09-05 (×11): 200 mL

## 2019-09-03 MED ORDER — ROCURONIUM BROMIDE 10 MG/ML (PF) SYRINGE
80.0000 mg | PREFILLED_SYRINGE | Freq: Once | INTRAVENOUS | Status: AC
  Administered 2019-09-03: 80 mg via INTRAVENOUS

## 2019-09-03 MED ORDER — ENOXAPARIN SODIUM 40 MG/0.4ML ~~LOC~~ SOLN
40.0000 mg | SUBCUTANEOUS | Status: DC
  Administered 2019-09-03 – 2019-09-08 (×6): 40 mg via SUBCUTANEOUS
  Filled 2019-09-03 (×6): qty 0.4

## 2019-09-03 MED ORDER — MIDAZOLAM HCL 2 MG/2ML IJ SOLN
2.0000 mg | Freq: Once | INTRAMUSCULAR | Status: AC | PRN
  Administered 2019-09-03: 2 mg via INTRAVENOUS

## 2019-09-03 MED ORDER — ETOMIDATE 2 MG/ML IV SOLN
20.0000 mg | Freq: Once | INTRAVENOUS | Status: AC
  Administered 2019-09-03: 20 mg via INTRAVENOUS

## 2019-09-03 MED ORDER — RACEPINEPHRINE HCL 2.25 % IN NEBU
INHALATION_SOLUTION | RESPIRATORY_TRACT | Status: AC
  Filled 2019-09-03: qty 0.5

## 2019-09-03 MED ORDER — MIDAZOLAM HCL 2 MG/2ML IJ SOLN
INTRAMUSCULAR | Status: AC
  Filled 2019-09-03: qty 2

## 2019-09-03 MED ORDER — FENTANYL CITRATE (PF) 100 MCG/2ML IJ SOLN
100.0000 ug | Freq: Once | INTRAMUSCULAR | Status: AC
  Administered 2019-09-03: 100 ug via INTRAVENOUS

## 2019-09-03 MED ORDER — RACEPINEPHRINE HCL 2.25 % IN NEBU
0.5000 mL | INHALATION_SOLUTION | Freq: Once | RESPIRATORY_TRACT | Status: AC
  Administered 2019-09-03: 0.5 mL via RESPIRATORY_TRACT

## 2019-09-03 NOTE — Progress Notes (Signed)
STROKE TEAM PROGRESS NOTE   INTERVAL HISTORY  She remains intubated and on ventilatory support for respiratory failure..  Neurologically seems unchanged .arousable and follows commands.  She is being weaned off vent  support with plans to extubate later today if tolerated.  Vital signs are stable.  T-max 100.4 Vitals:   09/03/19 1143 09/03/19 1200 09/03/19 1300 09/03/19 1502  BP: (!) 161/55 (!) 174/71 115/66 (!) 191/91  Pulse: 98 100 98 (!) 103  Resp: 16 16 16  (!) 21  Temp:  (!) 100.5 F (38.1 C)    TempSrc:  Axillary    SpO2: 100% 100% 100% 100%  Weight:      Height:       CBC:  Recent Labs  Lab 08/31/19 0216 09/03/19 0834  WBC 18.4* 9.1  HGB 5.0* 5.1*  HCT 16.8* 18.5*  MCV 103.7* 110.1*  PLT 246 229   Basic Metabolic Panel:  Recent Labs  Lab 08/29/19 0457 08/31/19 0107 08/31/19 0216 09/03/19 0834  NA 147*   < > 148* 162*  K 2.8*   < > 4.5 3.6  CL 112*  --  113* 117*  CO2 27  --  28 35*  GLUCOSE 177*  --  326* 111*  BUN 14  --  33* 23  CREATININE 0.87  --  0.93 0.90  CALCIUM 8.2*  --  8.5* 9.2  MG 2.0  --  2.4  --   PHOS 1.7*  --  4.0  --    < > = values in this interval not displayed.   Lipid Panel:     Component Value Date/Time   CHOL 217 (H) 05/22/2019 1312   TRIG 114 05/22/2019 1312   HDL 65 05/22/2019 1312   CHOLHDL 3.3 05/22/2019 1312   LDLCALC 132 (H) 05/22/2019 1312   HgbA1c:  Lab Results  Component Value Date   HGBA1C 11.2 (H) 05/22/2019   Urine Drug Screen: No results found for: LABOPIA, COCAINSCRNUR, LABBENZ, AMPHETMU, THCU, LABBARB  Alcohol Level No results found for: ETH  IMAGING past 24 hours No results found.  PHYSICAL EXAM    She is intubated.  And on ventilatory support  Temp:  [99.1 F (37.3 C)-100.5 F (38.1 C)] 100.5 F (38.1 C) (06/23 1200) Pulse Rate:  [63-103] 103 (06/23 1502) Resp:  [15-21] 21 (06/23 1502) BP: (115-191)/(47-132) 191/91 (06/23 1502) SpO2:  [94 %-100 %] 100 % (06/23 1502) FiO2 (%):  [40 %] 40 %  (06/23 1502)  General -middle-aged African-American lady who is intubated  Ophthalmologic - fundi not visualized due to noncooperation.  Cardiovascular - Regular rhythm and rate.  Neuro -drowsy she does open her eyes to sternal rub but does   follow only occasional right sided body commands.  Eyes are midline.  Pupils are pinpoint.  With forced eye opening, right gaze preference, not cross midline. Left neglect, not blinking to visual threat on the left. PERRL. Left facial droop. Left UE 0/5 and LLE 0/5 with pain stimulation. RUE 3/5 at least and RLE 2/5 with pain. Sensation, coordination not cooperative and gait not tested.   ASSESSMENT/PLAN Donna [name] is a 69 y.o. 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 MRI with scattered R MCA watershed infarcts, L frontal cortex and R cerebellar infarcts.  Intracranial Stenoses s/p angioplasty History of stroke  06/2018 R lentiform nucleus, external and extreme capsule infarct. mild periventricular small vessel disease. multiple uncontrolled risk factors. LDL 131 and A1C 11.4. OP w/u. Put on plavix but non-compliant. Intolerant to statins.   Found to have severe intracranial stenoses on MRA - referred to Dr. Estanislado Pandy.    10/2018 cerebral angio - 85% stenosis of the mid M1 segment of the right MCA. Severe high-grade approximately 90% stenosis of left VBJ, and of 60% just distal to the origin of the AICA. Approximately 50% stenosis at the origin of the right ICA at the bulb, and also about 60% of the right ICA at the cervical petrous junction.   Plan for 11/2018 procedure but P2Y12 was high, plavix changed to brilinta and procedure postponed.   08/25/2019  (Dr. Estanislado Pandy) S/p R cervical ICA/Petrous jxn angioplasty 08/25/19 ->25% residual stenosis. Severe stenosis of RT MCA M 1 seg with improved flow post angioplasty.    post-procedure worsening L sided weakness and neglect  MRI with scattered R MCA watershed infarcts, L frontal cortex and R cerebellar infarcts.   As needed labetalol started for persistent elevated blood pressure.  Severe Acute Blood Loss Anemia - Hematoma R Thigh and retroperitoneal hematoma  CT abd/pelvis 6/15 large R groin retroperitoneal hematoma iliac & inguinal mostly in muscle and soft tissue  Hgb 13.3->6.9->7->5.1->4.8->5.0->5.2->4.7->5.1->5.0  P2Y12 164  On Aranesp / Feraheme   VVS consult (Dr Scot Dock) - no procedure needed  Pt is a Jehovah's Witness - refuses all blood products including albumin, son confirmed  Aspirin and brilinta now on hold  Not safe for wound exploration d/t risk of additional blood loss  Hgb drop with dilutional component d/t increased fluids to maintain stent patency - now getting NS at 25 cc / hr ; Jevity 50 cc / hr  Femstop considered, not felt to be actively bleeding  Repeat CT abd/pelvis 6/16 no active bleeding  Treated with IV iron/erythropoietin following consult w/ [place]  Limit blood draws where possible   Stroke:   R MCA scattered watershed, L frontal and R cerebellar infarcts following R ICA/P2 jxn angioplasty   MRI  Scattered R MCA/watershed infarcts. L parietal cortex infarct. R cerebellar infarct.  MRA   Improved R ICA patency. Severe R M1 stenosis. High-grade L V4 and moderate mid BA stenosis.   CT head No progression of infarcts or hemorrhage  2D echo EF 60-65%. No source of embolus  aspirin 81 mg daily and Brilinta (ticagrelor) 90 mg bid prior to admission, continue hold given hmg   Therapy recommendations:  not medically ready for therapy  Disposition:  pending   Acute Respiratory distress   Increasing O2 demand  Received one  dose of lasix 6/16  On low dose precedex -> off now   CCM on board  Currently intubated.  We will try to reduce sedation as much as possible.  An EEG was obtained to evaluate for possible seizures for the acute respiratory changes.  Hypotension  BP goal 120-160  PICC line placed  Treated w/ Max neo -> now off  BP now ~120 (SBP 115->131)  CCM onboard   . Long-term BP goal 130-150 given intracranial stenoses  Hyperlipidemia  Home meds:  Repatha & fish oil  Intolerant to statins  LDL 132 in March, goal < 70  Continue Repatha at discharge  Diabetes type II Uncontrolled  HgbA1c 11.2 in March, goal < 7.0  CBGs  SSI  Hyperglycemia -  326 (SSI and scheduled insulin) continue to monitor on tube feeds  Dysphagia . Secondary to stroke . NPO . Cortrak TF @ 50 & IVF @ 25 . Speech on board   Hypokalemia  Potassium - 4.5->3.5->3.4->3.7->3.5->3.3 ->2.8->4.6->4.5  Pt had total of 160 meq KCL (Cortrak) yesterday plus 30 mmol potassium phosphate  Lasix 20 mg IV yesterday   Hypophosphatemia - 1.7->4.0  Limit blood draws where possible due to anemia  Fever  Tmax - 110.9->101.7->99.5->99.4 ax  Leukocytosis 11.8->11.4->11.6->13.1->10.2->18.4  Currently no antibiotics  Solumedrol 60 mg IV x 1 on 08/29/19   Blood cultures - 6/18 - no growth x 2 days - final pending   CXR 08/29/19 - Stable mild bilateral interstitial prominence. Again mild interstitial edema and/or pneumonitis cannot be excluded.  U/A - 6/19 - rare bacteria - not c/w UTI  Limit blood draws where possible due to anemia  Other Stroke Risk Factors  Advanced age  Overweight, Body mass index is 32.28 kg/m., recommend weight loss, diet and exercise as appropriate   Hx stroke/TIA (see above)  Family hx stroke (mother)  Snoring. OP sleep eval showed mild Obstructive sleep apnea overall but severe apnea during REM w/ CPAP recommended   Other Active Problems  Hx noncompliance  Metabolic  enceaphalopath  AKI pre-renal in setting of anemia 0.93->1.78->1.12->0.96->0.87->0.93  Hx palpitations on coreg, event monitor ordered 09/2018 but never completed   Code status - full code  Appreciate CCM assistance   Hospital day # 9 Continue ventilatory support for respiratory failure as per CCM team and trial of weaning if tolerated and extubate and Unasyn for presumed aspiration pneumonia.  Neurological exam remains unchanged  .  Discussed with Dr Val Eagle critical care team.  This patient is critically ill due to stroke post procedure, severe anemia, hematoma on the thigh, hypotension and at significant risk of neurological worsening, death form severe anemia, bleeding, recurrent stroke, intracranial stenosis. This patient's care requires constant monitoring of vital signs, hemodynamics, respiratory and cardiac monitoring, review of multiple databases, neurological assessment, other specialists and medical decision making of high complexity. I spent62minutes of neurocritical care time in the care of this patient  Donna Heady, MD   To contact Stroke Continuity provider, please refer to [email or web address]. After hours, contact General Neurology

## 2019-09-03 NOTE — Progress Notes (Addendum)
eLink Physician-Brief Progress Note Patient Name: Donna [name] DOB: 1950/07/31 MRN: [id number]   Date of Service  09/03/2019  HPI/Events of Note  Post-extubation respiratory difficulties, Pt is on BIPAP but does not appear comfortable.  eICU Interventions  Stat ABG and portable CXR, Pt may require re-intubation. Will ask ground team to evaluate for possible re-intubation.        Thomasene Lot Sheriece Jefcoat 09/03/2019, 8:40 PM

## 2019-09-03 NOTE — Progress Notes (Signed)
Referring Physician(s): Melvenia Beam  Supervising Physician: Luanne Bras  Patient Status:  [facility] - In-pt  Chief Complaint: None- intubated without sedation  Subjective:  Right ICA cervical/petrous junction stenosis s/p revascularization using balloon angioplasty via right radial and right femoral approach6/14/2021by Dr. Estanislado Pandy. Patientintubated without sedation. She responds to voice and follows simple commands. Is tearful on exam. Can spontaneously move right sidewith left-sided neglect. Right groin and right radial puncture sites c/d/i. Just had Cortrack placed bedside. Per RN having many BMs this AM. Most recent hgb6/20/2021 stable at 5.0- for repeat today. No family at bedside this AM.   Allergies: Patient has no known allergies.  Medications: Prior to Admission medications   Medication Sig Start Date End Date Taking? Authorizing Provider  amLODipine (NORVASC) 10 MG tablet Take 1 tablet (10 mg total) by mouth daily. 05/12/19  Yes Minette Brine, FNP  aspirin EC 81 MG tablet Take 81 mg by mouth every evening.   Yes [provider]  Calcium Carbonate-Vitamin D (CALCIUM-D PO) Take 2 tablets by mouth daily at 12 noon.    Yes [provider]  carvedilol (COREG) 6.25 MG tablet TAKE 1 TABLET(6.25 MG) BY MOUTH TWICE DAILY Patient taking differently: Take 6.25 mg by mouth 2 (two) times daily with a meal.  07/31/19  Yes Minette Brine, FNP  hydrALAZINE (APRESOLINE) 50 MG tablet Take 1 tablet (50 mg total) by mouth 2 (two) times daily. 06/16/19 09/14/19 Yes Elouise Munroe, MD  Insulin Degludec-Liraglutide (XULTOPHY) 100-3.6 UNIT-MG/ML SOPN Inject 30 Units into the skin daily. 08/14/19  Yes Minette Brine, FNP  telmisartan-hydrochlorothiazide (MICARDIS HCT) 40-12.5 MG tablet Take 1 tablet by mouth daily. 07/31/19  Yes Minette Brine, FNP  ticagrelor (BRILINTA) 90 MG TABS tablet Take 1 tablet (90 mg total) by mouth 2 (two) times daily. 06/12/19  Yes Elouise Munroe, MD  vitamin C (ASCORBIC ACID) 250 MG tablet Take 500 mg by mouth daily at 12 noon.   Yes [provider]  blood glucose meter kit and supplies KIT Dispense based on patient and insurance preference. Use up to four times daily as directed. (FOR ICD-9 250.00, 250.01). 05/10/18   Minette Brine, FNP  Blood Glucose Monitoring Suppl (TRUE METRIX METER) w/Device KIT 1 each by Does not apply route in the morning, at noon, in the evening, and at bedtime. 05/22/19   Minette Brine, FNP  Evolocumab (REPATHA SURECLICK) 048 MG/ML SOAJ Inject 140 mg into the skin every 14 (fourteen) days. 06/30/19   Elouise Munroe, MD  glucose blood (TRUE METRIX BLOOD GLUCOSE TEST) test strip Check blood sugar 4 times a day before meals and bedtime 05/22/19   Minette Brine, FNP  Insulin Glargine-Lixisenatide (SOLIQUA) 100-33 UNT-MCG/ML SOPN Inject 25 Units into the skin daily. Patient taking differently: Inject 30 Units into the skin daily.  07/14/19   Minette Brine, FNP  Multiple Vitamin (MULTIVITAMIN PO) Take 1 tablet by mouth daily at 12 noon.     [provider]  Omega-3 Fatty Acids (OMEGA-3 PLUS PO) Take 2 capsules by mouth daily at 12 noon.    [provider]     Vital Signs: BP (!) 175/63 Comment: cortrak getting placed at this time  Pulse (!) 101   Temp 99.1 F (37.3 C) (Axillary)   Resp 17   Ht 5' 1"  (1.549 m)   Wt 170 lb 13.7 oz (77.5 kg)   SpO2 100%   BMI 32.28 kg/m   Physical Exam Vitals and nursing note reviewed.  Constitutional:      General: She is not in acute distress.    Comments: Intubated without sedation.  Pulmonary:     Effort: Pulmonary effort is normal. No respiratory distress.     Comments: Intubated without sedation. Skin:    General: Skin is warm and dry.     Comments: Right groin puncture site soft without active bleeding or hematoma. Right radial puncture site soft without active bleeding or hematoma.    Neurological:     Comments: Intubated  without sedation. She responds to voice and follows simple commands. Tearful on exam. PERRL bilaterally. Right gaze preference. Can spontaneously move right side but no spontaneous movements of left side (left-sided neglect). Distal pulses (DPs) 1+ bilaterally and (radial) 2+ bilaterally.     Psychiatric:     Comments: Tearful.     Imaging: DG Chest Port 1 View  Result Date: 09/01/2019 CLINICAL DATA:  Acute respiratory failure. EXAM: PORTABLE CHEST 1 VIEW COMPARISON:  One-view chest x-ray 08/29/2019 FINDINGS: Endotracheal tube is stable. NG tube courses off the inferior border the film. Right-sided PICC line is stable. Heart size is exaggerated by low lung volumes. Atherosclerotic changes are noted at the aortic arch. Moderate pulmonary vascular congestion has increased slightly. Bibasilar airspace opacities likely reflect atelectasis. Atherosclerotic calcifications are noted in the neck. IMPRESSION: 1. Slight increase in moderate pulmonary vascular congestion. 2. Bibasilar airspace disease likely reflects atelectasis. Electronically Signed   By: San Morelle M.D.   On: 09/01/2019 07:16   Portable Chest x-ray  Result Date: 08/31/2019 CLINICAL DATA:  Endotracheal tube present.  OG tube placement. EXAM: PORTABLE CHEST 1 VIEW COMPARISON:  Radiograph 08/29/2019 FINDINGS: Endotracheal tube tip 2 cm from the carina. Previous weighted enteric tube is been removed, and there is a new enteric tube with tip and side-port below the diaphragm. Right upper extremity central line tip in the SVC. Unchanged heart size and mediastinal contours. Progressive pulmonary edema from prior exam. Possible trace pleural effusions. No pneumothorax. IMPRESSION: 1. Endotracheal tube tip 2 cm from the carina. 2. Enteric tube tip and side-port below the diaphragm in the stomach. 3. Progressive pulmonary edema from prior exam. Electronically Signed   By: Keith Rake M.D.   On: 08/31/2019 00:22   EEG adult  Result  Date: 08/31/2019 Lora Havens, MD     08/31/2019  4:20 PM Patient Name: Donna [name] MRN: [id number] Epilepsy Attending: Lora Havens Referring Physician/Provider: Dr Valla Leaver Date: 08/31/2019 Duration: 24.33 mins Patient history: 69yo F with R MCA infarct and worsening ams. EEG to evaluate for seizure Level of alertness: lethargic AEDs during EEG study: None Technical aspects: This EEG study was done with scalp electrodes positioned according to the 10-20 International system of electrode placement. Electrical activity was acquired at a sampling rate of '500Hz'$  and reviewed with a high frequency filter of '70Hz'$  and a low frequency filter of '1Hz'$ . EEG data were recorded continuously and digitally stored. Description: No posterior dominant rhythm was seen. EEG showed continuous generalized and lateralized right hemisphere 3 to 6 Hz theta-delta slowing. Sharp transients were seen in left anterior -temporal region. Hyperventilation and photic stimulation were not performed.   ABNORMALITY -Continuous slow, generalized and lateralized right hemisphere IMPRESSION: This study is suggestive of cortical dysfunction in right hemisphere most likely secondary to underlying infarct. There is also evidence of moderate diffuse encephalopathy, nonspecific etiology. No seizures or definite epileptiform discharges were seen throughout the recording. Priyanka O Yadav    Labs:  CBC:  Recent Labs    08/27/19 0230 08/27/19 0529 08/28/19 0500 08/28/19 0505 08/28/19 1608 08/29/19 0457 08/31/19 0107 08/31/19 0216  WBC 11.6*  --  13.1*  --   --  10.2  --  18.4*  HGB 4.8*   < > 5.2*   < > 6.8* 4.7* 5.1* 5.0*  HCT 15.1*   < > 16.5*   < > 20.0* 15.6* 15.0* 16.8*  PLT 182  --  177  --   --  177  --  246   < > = values in this interval not displayed.    COAGS: Recent Labs    11/27/18 0728 08/18/19 0705 08/26/19 1715 08/27/19 0230  INR 0.9 0.9 1.1 1.2    BMP: Recent Labs    08/27/19 0230 08/27/19 0546  08/28/19 0500 08/28/19 0505 08/28/19 1608 08/29/19 0457 08/31/19 0107 08/31/19 0216  NA 141   < > 143   < > 146* 147* 150* 148*  K 3.5   < > 3.7   < > 3.3* 2.8* 4.6 4.5  CL 114*  --  111  --   --  112*  --  113*  CO2 19*  --  22  --   --  27  --  28  GLUCOSE 189*  --  316*  --   --  177*  --  326*  BUN 21  --  13  --   --  14  --  33*  CALCIUM 7.6*  --  8.3*  --   --  8.2*  --  8.5*  CREATININE 1.12*  --  0.96  --   --  0.87  --  0.93  GFRNONAA 50*  --  >60  --   --  >60  --  >60  GFRAA 58*  --  >60  --   --  >60  --  >60   < > = values in this interval not displayed.    LIVER FUNCTION TESTS: Recent Labs    05/22/19 1312 08/27/19 0230 08/28/19 0500 08/29/19 0457  BILITOT 0.3 0.1* 0.9 0.8  AST 15 21 29 22   ALT 19 21 24 21   ALKPHOS 74 36* 46 42  PROT 7.5 4.9* 5.7* 5.5*  ALBUMIN 4.0 2.4* 2.7* 2.4*    Assessment and Plan:  Right ICA cervical/petrous junction stenosis s/p revascularization using balloon angioplasty via right radial and right femoral approach6/14/2021by Dr. Estanislado Pandy. Patientintubated without sedation (weaning), opens eyes to voice and follows simple commands, has spontaneous movements of right side but no spontaneous movements of left side. Right groin and right radial puncture sites stable, no signs of active bleeding. For possible extubation today. Continue to hold Brilinta/Aspirin.Agree with CCM plans to trent labs QOD to avoid excess blood drawl- awaiting CBC/CMP results from today. Further plans per CCM/neurology/vascular surgery- appreciate and agree with management. NIR to follow.   Electronically Signed: Earley Abide, PA-C 09/03/2019, 11:27 AM   I spent a total of 25 Minutes at the the patient's bedside AND on the patient's hospital floor or unit, greater than 50% of which was counseling/coordinating care for right ICA stenosis s/p revascularization.

## 2019-09-03 NOTE — Progress Notes (Signed)
SLP Cancellation Note  Patient Details Name: Donna [name] MRN: [id number] DOB: [birth year]   Cancelled treatment:        Remains intubated. Will continue to follow.   Royce Macadamia 09/03/2019, 7:40 AM  Breck Coons Lonell Face.Ed Nurse, children's [phone] Office [phone]

## 2019-09-03 NOTE — Progress Notes (Signed)
Nutrition Follow-up  RD working remotely.  DOCUMENTATION CODES:   Not applicable  INTERVENTION:   Continue tube feeding via Cortrak: - Jevity 1.2 @ 50 ml/hr (1200 ml/day) - Pro-stat 30 ml BID - Free water per MD, currently 200 ml q 4 hours  Tube feeding regimen provides 1640 kcal, 96 grams of protein, and 973 ml of H2O.   Free water flushes provide an additional 1200 ml free water for a total of 2173 ml free water.  NUTRITION DIAGNOSIS:   Inadequate oral intake related to inability to eat as evidenced by NPO status.  Ongoing, being addressed via TF  GOAL:   Patient will meet greater than or equal to 90% of their needs  Met via TF  MONITOR:   TF tolerance, Labs  REASON FOR ASSESSMENT:   Consult Enteral/tube feeding initiation and management  ASSESSMENT:   Pt who is a Jehovah witness with PMH of HTN, HLD, R MCA CVA 06/2018, DM, anemia, obesity, OSA admitted 6/14 for cerebral angiogram s/p R ICA angioplasty for severe stenosis.   Pt with severe acute blood loss anemia related to right thigh hematoma and retroperitoneal hematoma.  6/14 - s/p balloon angioplasty for severe stenosis, pt developed lethargy and increased L sided weakness found to have new watershed infarcts, started on vasopressors for cerebral perfusion 6/16 - cortrak placed, tip in stomach  6/17 - pt with increased agitation requiring increased O2 6/18 - pt pulled out Cortrak, Cortrak replaced 6/19 - Cortrak removed 6/20 - intubated 6/23 - Cortrak replaced, extubated  Current TF regimen: Jevity 1.2 @ 50 ml/hr, Pro-stat 30 ml BID, free water 200 ml q 4 hours  Medications reviewed and include: colace, pepcid, SSI q 4 hours, Novolog 8 units q 4 hours, Lantus 20 units daily  Labs reviewed: sodium 162, hemoglobin 5.1 CBG's: 102-257 x 24 hours  UOP: 1350 ml x 24 hours  Diet Order:   Diet Order            Diet NPO time specified  Diet effective now                 EDUCATION NEEDS:   Not  appropriate for education at this time  Skin:  Skin Assessment: Skin Integrity Issues: Incisions: right groin  Last BM:  09/03/19 large type 7  Height:   Ht Readings from Last 1 Encounters:  08/31/19 5' 1"  (1.549 m)    Weight:   Wt Readings from Last 1 Encounters:  08/30/19 77.5 kg    Ideal Body Weight:  47.7 kg  BMI:  Body mass index is 32.28 kg/m.  Estimated Nutritional Needs:   Kcal:  1550-1750  Protein:  85-100 grams  Fluid:  >1.5 L/day    Gaynell Face, MS, RD, LDN Inpatient Clinical Dietitian Pager: [phone] Weekend/After Hours: [phone]

## 2019-09-03 NOTE — Procedures (Signed)
Intubation Procedure Note HARINI DEARMOND  [id number]  [birth year]    Procedure: Intubation Indications: Respiratory insufficiency   Procedure Details Consent: Unable to obtain consent because of altered level of consciousness. Time Out: Performed   Maximum clean technique was used including gloves, hand hygiene and mask  Laryngoscopy equipment used: MAC 3 Glidescope   Medications: Fentanyl 100 mcg, Versed 0 mg, Etomidate 20 mg, Rocuronium 80 mg   Grade 1 airway view  Intubated with one attempt Positive color change on end tidal CO2 device.    Evaluation Hemodynamic Status: BP stable throughout, stable throughout Patient's Current Condition: stable Patient did tolerate procedure well Chest X-ray ordered to verify placement.  pending  Georgann Housekeeper, AGACNP-BC [place] Pager [phone] or [phone]  09/03/2019 9:32 PM

## 2019-09-03 NOTE — Progress Notes (Addendum)
PCCM INTERVAL PROGRESS NOTE   Called to evaluate patient for respiratory distress. She was extubated earlier today and almost immediately struggled requiring BiPAP. Has been on BiPAP since about 1600. Now 2100. She is tachypneic, hypertensive, and tachycardic. Appears to be in respiratory distress. Is unable to communicate any dyspnea. Coarse bilateral breath sounds. ABG shows PO2 in the 70s despite 40% oxygen. Concernf  Plan: Proceed with re-intubation.  Place back on prior vent settings Restart precedex infusion for sedation.  CXR ABG VAP bundle  Family updated  Additional critical care time 20 minutes.   Joneen Roach, AGACNP-BC [place] [facility]  See Amion for personal pager PCCM on call pager [phone]  09/03/2019 9:34 PM

## 2019-09-03 NOTE — Procedures (Signed)
Cortrak  Person Inserting Tube:  Tenicia Gural M, RD Tube Type:  Cortrak - 43 inches Tube Location:  Right nare Initial Placement:  Stomach Secured by: Bridle Technique Used to Measure Tube Placement:  Documented cm marking at nare/ corner of mouth Cortrak Secured At:  65 cm Procedure Comments:  Cortrak Tube Team Note:  Consult received to place a Cortrak feeding tube.   No x-ray is required. RN may begin using tube.   If the tube becomes dislodged please keep the tube and contact the Cortrak team at [email or web address] ([email or web address]) for replacement.  If after hours and replacement cannot be delayed, place a NG tube and confirm placement with an abdominal x-ray.      Donna Clapham, MS, RD, LDN, CNSC Inpatient Clinical Dietitian RD pager # available in AMION  After hours/weekend pager # available in AMION      

## 2019-09-03 NOTE — Progress Notes (Signed)
NAME:  Donna [name], MRN:  [id number], DOB:  1950/12/25, LOS: 9 ADMISSION DATE:  08/25/2019, CONSULTATION DATE:  08/27/2019 REFERRING MD:  Dr. 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  Was placed on ASA/ brillinta.  Monitored in Neuro ICU.  Noted to have developed hypotension, increased left sided weakness and lethargy on 6/15 am with Hgb drop from 13.3 to 6.9 with new AKI.  Post MRI/ MRA showed scattered small acute infarcts along the right MCA/ watershed territory suspected to be resultant either post procedure vs hypoperfusion.  Neurology was consulted with recommendations to keep SBP > 140 for cerebral perfusion.  A CT abd/ pelvis was obtained given concern for retroperitoneal bleed which showed a mild to moderate amount of retroperitoneal hematoma in the right iliac and inguinal regiones with moderate size hematoma noted the the soft tissues anterior to the musculature of the right hip; no definite intrapertioneal hemorrhage. INR, PT, and platelets rechecked and were wnl.  Vascular surgery was consulted with plans to observe given bleeding was mostly into the muscle and tissue.  She was started on neosynephrine to keep SBP goal > 140-160 for cerebral perfusion.  PCCM consulted to help with vasopressor support.  S/p 1L bolus  Repeat Hgb now 6.9-> 7-> 5.1.   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    ETT 6/19 >>  Significant Diagnostic Tests:   6/15 MRA/ MRI brain  >> 1. Scattered small acute infarcts along the right MCA/watershed territory. Solitary small acute infarct in the left parietal cortex and right cerebellum. 2. Improved right ICA patency at the petrous segment. 3. Known severe right M1 segment stenosis. 4. 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  6/15 CT a/p wo contrast >> 1. Mild to moderate amount of retroperitoneal hematoma is noted in the right iliac and inguinal regions. Moderate size hematoma is noted in the soft tissues anterior to the musculature of the right hip. No definite intraperitoneal hemorrhage is noted. 2. Moderate size fat containing periumbilical hernia. Aortic Atherosclerosis   Echo 6/16 >> normal LVEF  Micro Data:  6/10 SARS 2 >> neg 6/14 MRSA PCR >> neg  Antimicrobials:  6/14 cefazolin pre-op  6/20 unasyn >> 09/02/2019  Interim history/subjective:   No issues overnight. On SBT SAT this AM. Follows some commands   Objective   Blood pressure 139/63, pulse 80, temperature 99.2 F (37.3 C), temperature source Axillary, resp. rate 17, height 5\' 1"  (1.549 m), weight 77.5 kg, SpO2 100 %.    Vent Mode: PRVC FiO2 (%):  [40 %] 40 % Set Rate:  [15 bmp] 15 bmp Vt Set:  [410 mL] 410 mL PEEP:  [5 cmH20] 5 cmH20 Pressure Support:  [10 cmH20] 10 cmH20 Plateau Pressure:  [13 cmH20-18 cmH20]  17 cmH20   Intake/Output Summary (Last 24 hours) at 09/03/2019 0830 Last data filed at 09/03/2019 0800 Gross per 24 hour  Intake 3025.92 ml  Output 1350 ml  Net 1675.92 ml   Filed Weights   08/25/19 0705 08/30/19 0500  Weight: 68.5 kg 77.5 kg    Examination:  General: elderly fm, intubated  on MV HEENT: NCAT, tracking   Neck: no JVD  Lungs: BL vented breaths Heart: RRR, s1 s2 Abdomen: soft nt nd  Neuro: off sedation, no movement on left, moves right UE  Extremities: no edema   Chest x-ray 09/01/2019: reviewed  Labs: labs every other day    Resolved Hospital Problem list   Hypotension   Assessment & Plan:   s/p RT ICA cer/petrous junction balloon angioplasty for severe stenosis. Post-procedure with residual stenosis of approx 25 %. Severe stenosis of RT MCA M1 seg with improved flow post angioplasty Right MCA watershed territory infarct Small left frontal cortex and right cerebellum  P:  - per neuro IR - support care   Acute respiratory failure with hypoxia - -CXR with pulm edema, mild BUL opacities  -Mild upper airway pseudowheeze 6/18, diuresed and given Solu-Medrol for stridor -Reintubated for acute hypercarbic respiratory failure 6/20 with bilateral infiltrates?  Aspiration versus edema , favor aspiration given leukocytosis P - completed course of unasyn  - recheck cbc today to look at WBC  - good UOP   Acute metabolic encephalopathy - -in setting of CNS depressing medications, R MCA CVA P - holding sedation  - sbt sat this AM - plans for liberation this morning if possible   Acute blood loss anemia - Jehovah's Witness: no blood products including albumin - CTA Abd/Pelvis 08/26/19 with hematoma of the right groin and retroperitoneum with expansion to the right rectus femoris expansion. No active bleeding S/p -  feraheme x2 doses P - lab repeats today   AKI -resolved  DM, type II - uncontrolled P:  lantus + CBGs and SSI  Increase lantus  Hypokalemia and hypophosphatemia P: -replete as needed   Nutrition:  cortrack placement   Best practice:  Diet: NPO  Pain/Anxiety/Delirium protocol (if indicated): n/a VAP protocol (if indicated): n/a DVT prophylaxis: SCDs only in setting of acute anemia/blood loss GI prophylaxis: Pepcid Glucose  control: CBG q 4 Mobility: BR Code Status: Full  Family Communication: - son, [name] at [phone]  Disposition: ICU  This patient is critically ill with multiple organ system failure; which, requires frequent high complexity decision making, assessment, support, evaluation, and titration of therapies. This was completed through the application of advanced monitoring technologies and extensive interpretation of multiple databases. During this encounter critical care time was devoted to patient care services described in this note for 32 minutes.  Garner Nash, DO [facility] 09/03/2019 8:30 AM

## 2019-09-03 NOTE — Procedures (Signed)
Extubation Procedure Note  Patient Details:   Name: Donna [name] DOB: 03-13-1951 MRN: [id number]   Airway Documentation:    Vent end date: 09/03/19 Vent end time: 1615   Evaluation  O2 sats: stable throughout Complications: No apparent complications Patient did tolerate procedure well. Bilateral Breath Sounds: Rhonchi, Diminished   Yes   Order received for extubation.  Cuff leak positive prior to extubation.  Placed on 4l nasal cannula.  Patient able to vocalize post extubation; no stridor noted.  No complications noted.  Lysbeth Penner [facility] 09/03/2019, 4:15 PM

## 2019-09-04 LAB — GLUCOSE, CAPILLARY
Glucose-Capillary: 157 mg/dL — ABNORMAL HIGH (ref 70–99)
Glucose-Capillary: 146 mg/dL — ABNORMAL HIGH (ref 70–99)
Glucose-Capillary: 163 mg/dL — ABNORMAL HIGH (ref 70–99)
Glucose-Capillary: 127 mg/dL — ABNORMAL HIGH (ref 70–99)
Glucose-Capillary: 82 mg/dL (ref 70–99)

## 2019-09-04 MED ORDER — VITAL AF 1.2 CAL PO LIQD
1000.0000 mL | ORAL | Status: DC
  Administered 2019-09-04 – 2019-09-22 (×18): 1000 mL

## 2019-09-04 MED ORDER — CHLORHEXIDINE GLUCONATE CLOTH 2 % EX PADS
6.0000 | MEDICATED_PAD | Freq: Every day | CUTANEOUS | Status: DC
  Administered 2019-09-05 – 2019-10-31 (×58): 6 via TOPICAL

## 2019-09-04 MED ORDER — PRO-STAT SUGAR FREE PO LIQD
30.0000 mL | Freq: Every day | ORAL | Status: DC
  Administered 2019-09-05 – 2019-09-25 (×21): 30 mL
  Filled 2019-09-04 (×22): qty 30

## 2019-09-04 NOTE — TOC Progression Note (Signed)
Transition of Care [facility]) - Progression Note    Patient Details  Name: Donna [name] MRN: [id number] Date of Birth: [birth year]  [facility] of [facility] CM/SW Contact  Glennon Mac, RN Phone Number: 09/04/2019, 4:46 PM  Clinical Narrative:   Pt extubated on 6/23, but reintubated overnight as unable to protect airway.  Hemoglobin remains critically low.  Palliative consult requested today to assist family with decision making.  Will follow.       Expected Discharge Plan: IP Rehab Facility Barriers to Discharge: Continued Medical Work up  Expected Discharge Plan and Services Expected Discharge Plan: IP Rehab Facility                                               Social Determinants of Health (SDOH) Interventions    Readmission Risk Interventions No flowsheet data found.  Quintella Baton, RN, BSN  Trauma/Neuro ICU Case Manager [phone]

## 2019-09-04 NOTE — Progress Notes (Signed)
Nutrition Follow-up  DOCUMENTATION CODES:   Not applicable  INTERVENTION:   Tube feeding via Cortrak: - Change to Vital AF 1.2 @ 50 ml/hr (1200 ml/day) - Pro-stat 30 ml daily - Free water per CCM, currently 200 ml q 4 hours  Tube feeding regimen provides 1540 kcal, 105 grams of protein, and 973 ml of H2O.  Free water flushes provide an additional 1200 ml of free water for a total of 2173 ml free water daily.  NUTRITION DIAGNOSIS:   Inadequate oral intake related to inability to eat as evidenced by NPO status.  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  Pt with severe acute blood loss anemia related to right thigh hematoma and retroperitoneal hematoma.  6/14 - s/p balloon angioplasty for severe stenosis, pt developed lethargy and increased L sided weakness found to have new watershed infarcts, started on vasopressors for cerebral perfusion 6/16 - cortrak placed, tip in stomach 6/17 - pt with increased agitation requiring increased O2 6/18 - pt pulled out Cortrak, Cortrak replaced 6/19 - Cortrak removed 6/20 - intubated 6/23 - Cortrak replaced (tip gastric per Cortrak team), extubated, later reintubated  Discussed pt with RN.  Per CCM note, Palliative has been consulted to help family with discussions regarding pt's potential need for prolonged ventilator support.  Pt with non-pitting generalized edema and mild pitting edema to LUE. Will utilize weight of 68.5 kg from 08/18/19 encounter as EDW (BMI 28.55).  Current TF regimen: Jevity 1.2 @ 50 ml/hr, Pro-stat 30 ml BID, free water 200 ml q 4 hours  Patient is  currently intubated on ventilator support MV: 7.2 L/min Temp (24hrs), Avg:99.2 F (37.3 C), Min:98.1 F (36.7 C), Max:100.5 F (38.1 C) BP (cuff): 136/54 MAP (cuff): 77  Drips: Precedex: 11.6 ml/hr NS: 10 ml/hr  Medications reviewed and include: colace, pepcid, SSI q 4 hours, Novolog 8 units q 4 hours, Lantus 20 units daily  Labs reviewed: sodium 159, potassium 3.4, hemoglobin 5.1 CBG's: 102-170 x 24 hours  UOP: 1050 ml x 24 hours  Diet Order:   Diet Order            Diet NPO time specified  Diet effective now                 EDUCATION NEEDS:   Not appropriate for education at this time  Skin:  Skin Assessment: Skin Integrity Issues: Incisions: right groin  Last BM:  09/03/19 large type 7  Height:   Ht Readings from Last 1 Encounters:  08/31/19 5' 1"  (1.549 m)    Weight:   Wt Readings from Last 1 Encounters:  09/04/19 75.3 kg    Ideal Body Weight:  47.7 kg  BMI:  Body mass index is 31.37 kg/m.  Estimated Nutritional Needs:   Kcal:  1485  Protein:  90-105 grams  Fluid:  >1.5 L/day    Gaynell Face, MS, RD, LDN Inpatient Clinical Dietitian Pager: [phone] Weekend/After Hours: [phone]

## 2019-09-04 NOTE — Progress Notes (Signed)
STROKE TEAM PROGRESS NOTE   INTERVAL HISTORY  She remains intubated and on ventilatory support for respiratory failure.Marland Kitchen She was extubated yesterday but developed respiratory distress due to inability to protect airway and was reintubated.  She may need prolonged ventilatory support possibly tracheostomy family needs to make long-term decisions and palliative care consult would be appropriate.  Neurological exam is unchanged Vitals:   09/04/19 1800 09/04/19 1900 09/04/19 1930 09/04/19 2000  BP: (!) 150/46 (!) 149/45 (!) 149/45 (!) 145/53  Pulse: 66 65 70 71  Resp: 18 18 18 18   Temp:  99.1 F (37.3 C)    TempSrc:  Axillary    SpO2: 100% 100%  100%  Weight:      Height:       CBC:  Recent Labs  Lab 08/31/19 0216 08/31/19 0216 09/03/19 0834 09/03/19 2106  WBC 18.4*  --  9.1  --   HGB 5.0*   < > 5.1* 5.1*  HCT 16.8*   < > 18.5* 15.0*  MCV 103.7*  --  110.1*  --   PLT 246  --  229  --    < > = values in this interval not displayed.   Basic Metabolic Panel:  Recent Labs  Lab 08/29/19 0457 08/31/19 0107 08/31/19 0216 08/31/19 0216 09/03/19 09/05/19 09/03/19 0834 09/03/19 1633 09/03/19 2106  NA 147*   < > 148*   < > 162*   < > 158* 159*  K 2.8*   < > 4.5   < > 3.6  --   --  3.4*  CL 112*  --  113*  --  117*  --   --   --   CO2 27  --  28  --  35*  --   --   --   GLUCOSE 177*  --  326*  --  111*  --   --   --   BUN 14  --  33*  --  23  --   --   --   CREATININE 0.87  --  0.93  --  0.90  --   --   --   CALCIUM 8.2*  --  8.5*  --  9.2  --   --   --   MG 2.0  --  2.4  --   --   --   --   --   PHOS 1.7*  --  4.0  --   --   --   --   --    < > = values in this interval not displayed.   Lipid Panel:     Component Value Date/Time   CHOL 217 (H) 05/22/2019 1312   TRIG 114 05/22/2019 1312   HDL 65 05/22/2019 1312   CHOLHDL 3.3 05/22/2019 1312   LDLCALC 132 (H) 05/22/2019 1312   HgbA1c:  Lab Results  Component Value Date   HGBA1C 11.2 (H) 05/22/2019   Urine Drug Screen: No  results found for: LABOPIA, COCAINSCRNUR, LABBENZ, AMPHETMU, THCU, LABBARB  Alcohol Level No results found for: ETH  IMAGING past 24 hours DG Chest Port 1 View  Result Date: 09/03/2019 CLINICAL DATA:  Respiratory failure EXAM: PORTABLE CHEST 1 VIEW COMPARISON:  09/01/2019 FINDINGS: Support Apparatus: --Endotracheal tube: Tip 1.5 cm above the inferior margin of the carina. --Enteric tube:Tip and sideport are below the field of view. --Catheter(s):Right internal jugular vein approach central venous catheter tip is in lower SVC. --Other: None The heart size and mediastinal contours  are within normal limits. The lungs are better aerated. No pleural effusion or pneumothorax. IMPRESSION: 1. Endotracheal tube tip 1.5 cm above the inferior margin of the carina. 2. Improved aeration of the lungs. Electronically Signed   By: Deatra Robinson M.D.   On: 09/03/2019 21:44    PHYSICAL EXAM    She is intubated. and on ventilatory support  Temp:  [98.1 F (36.7 C)-99.1 F (37.3 C)] 99.1 F (37.3 C) (06/24 1900) Pulse Rate:  [58-89] 71 (06/24 2000) Resp:  [16-27] 18 (06/24 2000) BP: (103-158)/(44-101) 145/53 (06/24 2000) SpO2:  [100 %] 100 % (06/24 2000) FiO2 (%):  [40 %-50 %] 40 % (06/24 2000) Weight:  [75.3 kg] 75.3 kg (06/24 0500)  General -middle-aged African-American lady who is intubated  Ophthalmologic - fundi not visualized due to noncooperation.  Cardiovascular - Regular rhythm and rate.  Neuro -drowsy she does open her eyes to sternal rub but does   follow only occasional right sided body commands.  Eyes are midline.  Pupils are pinpoint.  With forced eye opening, right gaze preference, not cross midline. Left neglect, not blinking to visual threat on the left. PERRL. Left facial droop. Left UE 0/5 and LLE 0/5 with pain stimulation. RUE 3/5 at least and RLE 2/5 with pain. Sensation, coordination not cooperative and gait not tested.   ASSESSMENT/PLAN Donna [name] is a 69 y.o. female with  history of right MCA territory stroke with residual mild left hemiparesis, multiple intracranial stenosis including those of anterior and posterior circulation, sleep apnea, hypertension, hyperlipidemia, diabetes s/p R cervical ICA/P2 jxn angioplasty 08/25/19 who post-procedure developed worsening L sided weakness and neglect. MRI with scattered R MCA watershed infarcts, L frontal cortex and R cerebellar infarcts.   Intracranial Stenoses s/p angioplasty History of stroke  06/2018 R lentiform nucleus, external and extreme capsule infarct. mild periventricular small vessel disease. multiple uncontrolled risk factors. LDL 131 and A1C 11.4. OP w/u. Put on plavix but non-compliant. Intolerant to statins.   Found to have severe intracranial stenoses on MRA - referred to Dr. Corliss Skains.    10/2018 cerebral angio - 85% stenosis of the mid M1 segment of the right MCA. Severe high-grade approximately 90% stenosis of left VBJ, and of 60% just distal to the origin of the AICA. Approximately 50% stenosis at the origin of the right ICA at the bulb, and also about 60% of the right ICA at the cervical petrous junction.   Plan for 11/2018 procedure but P2Y12 was high, plavix changed to brilinta and procedure postponed.   08/25/2019 (Dr. Corliss Skains) S/p R cervical ICA/Petrous jxn angioplasty 08/25/19 ->25% residual stenosis. Severe stenosis of RT MCA M 1 seg with improved flow post angioplasty.    post-procedure worsening L sided weakness and neglect  MRI with scattered R MCA watershed infarcts, L frontal cortex and R cerebellar infarcts.   As needed labetalol started for persistent elevated blood pressure.  Severe Acute Blood Loss Anemia - Hematoma R Thigh and retroperitoneal hematoma  CT abd/pelvis 6/15 large R groin retroperitoneal hematoma iliac & inguinal mostly in muscle and soft tissue  Hgb 13.3->6.9->7->5.1->4.8->5.0->5.2->4.7->5.1->5.0  P2Y12 164  On Aranesp / Feraheme   VVS consult (Dr Edilia Bo) - no  procedure needed  Pt is a Jehovah's Witness - refuses all blood products including albumin, son confirmed  Aspirin and brilinta now on hold  Not safe for wound exploration d/t risk of additional blood loss  Hgb drop with dilutional component d/t increased fluids to maintain stent patency -  now getting NS at 25 cc / hr ; Jevity 50 cc / hr  Femstop considered, not felt to be actively bleeding  Repeat CT abd/pelvis 6/16 no active bleeding  Treated with IV iron/erythropoietin following consult w/ [facility] Committee  Limit blood draws where possible   Stroke:   R MCA scattered watershed, L frontal and R cerebellar infarcts following R ICA/P2 jxn angioplasty   MRI  Scattered R MCA/watershed infarcts. L parietal cortex infarct. R cerebellar infarct.  MRA   Improved R ICA patency. Severe R M1 stenosis. High-grade L V4 and moderate mid BA stenosis.   CT head No progression of infarcts or hemorrhage  2D echo EF 60-65%. No source of embolus  aspirin 81 mg daily and Brilinta (ticagrelor) 90 mg bid prior to admission, continue hold given hmg   Therapy recommendations:  not medically ready for therapy  Disposition:  pending   Acute Respiratory distress   Increasing O2 demand  Received one dose of lasix 6/16  On low dose precedex -> off now   CCM on board Currently intubated.  Was extubated 09/03/2019 but reintubated due to inability to protect airway. Hypotension  BP goal 120-160  PICC line placed  Treated w/ Max neo -> now off  BP now ~120 (SBP 115->131)  CCM onboard   . Long-term BP goal 130-150 given intracranial stenoses  Hyperlipidemia  Home meds:  Repatha & fish oil  Intolerant to statins  LDL 132 in March, goal < 70  Continue Repatha at discharge  Diabetes type II Uncontrolled  HgbA1c 11.2 in March, goal < 7.0  CBGs  SSI  Hyperglycemia - 326 (SSI and scheduled insulin) continue to monitor on tube feeds  Dysphagia . Secondary to  stroke . NPO . Cortrak TF @ 50 & IVF @ 25 . Speech on board   Hypokalemia  Potassium - 4.5->3.5->3.4->3.7->3.5->3.3 ->2.8->4.6->4.5  Pt had total of 160 meq KCL (Cortrak) yesterday plus 30 mmol potassium phosphate  Lasix 20 mg IV yesterday   Hypophosphatemia - 1.7->4.0  Limit blood draws where possible due to anemia  Fever  Tmax - 110.9->101.7->99.5->99.4 ax  Leukocytosis 11.8->11.4->11.6->13.1->10.2->18.4  Currently no antibiotics  Solumedrol 60 mg IV x 1 on 08/29/19   Blood cultures - 6/18 - no growth x 2 days - final pending   CXR 08/29/19 - Stable mild bilateral interstitial prominence. Again mild interstitial edema and/or pneumonitis cannot be excluded.  U/A - 6/19 - rare bacteria - not c/w UTI  Limit blood draws where possible due to anemia  Other Stroke Risk Factors  Advanced age  Overweight, Body mass index is 31.37 kg/m., recommend weight loss, diet and exercise as appropriate   Hx stroke/TIA (see above)  Family hx stroke (mother)  Snoring. OP sleep eval showed mild Obstructive sleep apnea overall but severe apnea during REM w/ CPAP recommended   Other Active Problems  Hx noncompliance  Metabolic enceaphalopath  AKI pre-renal in setting of anemia 0.93->1.78->1.12->0.96->0.87->0.93  Hx palpitations on coreg, event monitor ordered 09/2018 but never completed   Code status - full code  Appreciate CCM assistance   Hospital day # 10 Continue ventilatory support for respiratory failure as per CCM team and patient will likely need long-term ventilatory support due to inability to protect airway damage from stroke.  Prognosis remains guarded..  Neurological exam remains unchanged  .  Discussed with DrIcard critical care team.  This patient is critically ill due to stroke post procedure, severe anemia, hematoma on the thigh, hypotension  and at significant risk of neurological worsening, death form severe anemia, bleeding, recurrent stroke, intracranial  stenosis. This patient's care requires constant monitoring of vital signs, hemodynamics, respiratory and cardiac monitoring, review of multiple databases, neurological assessment, other specialists and medical decision making of high complexity. I spent37minutes of neurocritical care time in the care of this patient.  Palliative care consult would be appropriate.  Stroke team will sign off.  Kindly call for questions.  Antony Contras, MD   To contact Stroke Continuity provider, please refer to [email or web address] After hours, contact General Neurology

## 2019-09-04 NOTE — Progress Notes (Signed)
Referring Physician(s): Dr. Jaynee Eagles  Supervising Physician: Luanne Bras  Patient Status:  [facility] - In-pt  Chief Complaint: Right ICA cervical/petrous junction stenosis s/p revascularization using balloon angioplasty via right radial and right femoral approach6/14/2021by Dr. Estanislado Pandy. R groin/RP hematoma with critically low hemoglobin  Subjective: Unable to maintain airway and O2 after trial extubation yesterday. She immediately required Bi-pap post-extubated, then was ultimately re-intubated overnight.  HgB stable at 5.0  Eyes closed on ventilator, but grimaces face with name is called.  Still no movement on left side.  No family at bedside this AM.   Allergies: Patient has no known allergies.  Medications: Prior to Admission medications   Medication Sig Start Date End Date Taking? Authorizing Provider  amLODipine (NORVASC) 10 MG tablet Take 1 tablet (10 mg total) by mouth daily. 05/12/19  Yes Minette Brine, FNP  aspirin EC 81 MG tablet Take 81 mg by mouth every evening.   Yes [provider]  Calcium Carbonate-Vitamin D (CALCIUM-D PO) Take 2 tablets by mouth daily at 12 noon.    Yes [provider]  carvedilol (COREG) 6.25 MG tablet TAKE 1 TABLET(6.25 MG) BY MOUTH TWICE DAILY Patient taking differently: Take 6.25 mg by mouth 2 (two) times daily with a meal.  07/31/19  Yes Minette Brine, FNP  hydrALAZINE (APRESOLINE) 50 MG tablet Take 1 tablet (50 mg total) by mouth 2 (two) times daily. 06/16/19 09/14/19 Yes Elouise Munroe, MD  Insulin Degludec-Liraglutide (XULTOPHY) 100-3.6 UNIT-MG/ML SOPN Inject 30 Units into the skin daily. 08/14/19  Yes Minette Brine, FNP  telmisartan-hydrochlorothiazide (MICARDIS HCT) 40-12.5 MG tablet Take 1 tablet by mouth daily. 07/31/19  Yes Minette Brine, FNP  ticagrelor (BRILINTA) 90 MG TABS tablet Take 1 tablet (90 mg total) by mouth 2 (two) times daily. 06/12/19  Yes Elouise Munroe, MD  vitamin C (ASCORBIC ACID) 250 MG tablet  Take 500 mg by mouth daily at 12 noon.   Yes [provider]  blood glucose meter kit and supplies KIT Dispense based on patient and insurance preference. Use up to four times daily as directed. (FOR ICD-9 250.00, 250.01). 05/10/18   Minette Brine, FNP  Blood Glucose Monitoring Suppl (TRUE METRIX METER) w/Device KIT 1 each by Does not apply route in the morning, at noon, in the evening, and at bedtime. 05/22/19   Minette Brine, FNP  Evolocumab (REPATHA SURECLICK) 330 MG/ML SOAJ Inject 140 mg into the skin every 14 (fourteen) days. 06/30/19   Elouise Munroe, MD  glucose blood (TRUE METRIX BLOOD GLUCOSE TEST) test strip Check blood sugar 4 times a day before meals and bedtime 05/22/19   Minette Brine, FNP  Insulin Glargine-Lixisenatide (SOLIQUA) 100-33 UNT-MCG/ML SOPN Inject 25 Units into the skin daily. Patient taking differently: Inject 30 Units into the skin daily.  07/14/19   Minette Brine, FNP  Multiple Vitamin (MULTIVITAMIN PO) Take 1 tablet by mouth daily at 12 noon.     [provider]  Omega-3 Fatty Acids (OMEGA-3 PLUS PO) Take 2 capsules by mouth daily at 12 noon.    [provider]     Vital Signs: BP (!) 136/54   Pulse 68   Temp 98.1 F (36.7 C) (Axillary)   Resp 18   Ht 5' 1"  (1.549 m)   Wt 166 lb 0.1 oz (75.3 kg)   SpO2 100%   BMI 31.37 kg/m   Physical Exam  Intubated, sedated.  Grimaces when name is called but does not follow commands Chest: on vent,  coarse breath sounds. Groin: thigh soft Neuro: awakens, but does not open eyes. Now sedated.   Imaging: DG Chest Port 1 View  Result Date: 09/03/2019 CLINICAL DATA:  Respiratory failure EXAM: PORTABLE CHEST 1 VIEW COMPARISON:  09/01/2019 FINDINGS: Support Apparatus: --Endotracheal tube: Tip 1.5 cm above the inferior margin of the carina. --Enteric tube:Tip and sideport are below the field of view. --Catheter(s):Right internal jugular vein approach central venous catheter tip is in lower SVC. --Other:  None The heart size and mediastinal contours are within normal limits. The lungs are better aerated. No pleural effusion or pneumothorax. IMPRESSION: 1. Endotracheal tube tip 1.5 cm above the inferior margin of the carina. 2. Improved aeration of the lungs. Electronically Signed   By: Ulyses Jarred M.D.   On: 09/03/2019 21:44   DG Chest Port 1 View  Result Date: 09/01/2019 CLINICAL DATA:  Acute respiratory failure. EXAM: PORTABLE CHEST 1 VIEW COMPARISON:  One-view chest x-ray 08/29/2019 FINDINGS: Endotracheal tube is stable. NG tube courses off the inferior border the film. Right-sided PICC line is stable. Heart size is exaggerated by low lung volumes. Atherosclerotic changes are noted at the aortic arch. Moderate pulmonary vascular congestion has increased slightly. Bibasilar airspace opacities likely reflect atelectasis. Atherosclerotic calcifications are noted in the neck. IMPRESSION: 1. Slight increase in moderate pulmonary vascular congestion. 2. Bibasilar airspace disease likely reflects atelectasis. Electronically Signed   By: San Morelle M.D.   On: 09/01/2019 07:16   EEG adult  Result Date: 08/31/2019 Lora Havens, MD     08/31/2019  4:20 PM Patient Name: Donna [name] MRN: [id number] Epilepsy Attending: Lora Havens Referring Physician/Provider: Dr Valla Leaver Date: 08/31/2019 Duration: 24.33 mins Patient history: 69yo F with R MCA infarct and worsening ams. EEG to evaluate for seizure Level of alertness: lethargic AEDs during EEG study: None Technical aspects: This EEG study was done with scalp electrodes positioned according to the 10-20 International system of electrode placement. Electrical activity was acquired at a sampling rate of '500Hz'$  and reviewed with a high frequency filter of '70Hz'$  and a low frequency filter of '1Hz'$ . EEG data were recorded continuously and digitally stored. Description: No posterior dominant rhythm was seen. EEG showed continuous generalized and  lateralized right hemisphere 3 to 6 Hz theta-delta slowing. Sharp transients were seen in left anterior -temporal region. Hyperventilation and photic stimulation were not performed.   ABNORMALITY -Continuous slow, generalized and lateralized right hemisphere IMPRESSION: This study is suggestive of cortical dysfunction in right hemisphere most likely secondary to underlying infarct. There is also evidence of moderate diffuse encephalopathy, nonspecific etiology. No seizures or definite epileptiform discharges were seen throughout the recording. [place]:  CBC: Recent Labs    08/28/19 0500 08/28/19 0505 08/29/19 0457 08/29/19 0457 08/31/19 0107 08/31/19 0216 09/03/19 0834 09/03/19 2106  WBC 13.1*  --  10.2  --   --  18.4* 9.1  --   HGB 5.2*   < > 4.7*   < > 5.1* 5.0* 5.1* 5.1*  HCT 16.5*   < > 15.6*   < > 15.0* 16.8* 18.5* 15.0*  PLT 177  --  177  --   --  246 229  --    < > = values in this interval not displayed.    COAGS: Recent Labs    11/27/18 0728 08/18/19 0705 08/26/19 1715 08/27/19 0230  INR 0.9 0.9 1.1 1.2    BMP: Recent Labs    08/28/19 0500 08/28/19 0505  08/29/19 0457 08/29/19 0457 08/31/19 0107 08/31/19 0107 08/31/19 0216 09/03/19 0834 09/03/19 1633 09/03/19 2106  NA 143   < > 147*   < > 150*   < > 148* 162* 158* 159*  K 3.7   < > 2.8*   < > 4.6  --  4.5 3.6  --  3.4*  CL 111  --  112*  --   --   --  113* 117*  --   --   CO2 22  --  27  --   --   --  28 35*  --   --   GLUCOSE 316*  --  177*  --   --   --  326* 111*  --   --   BUN 13  --  14  --   --   --  33* 23  --   --   CALCIUM 8.3*  --  8.2*  --   --   --  8.5* 9.2  --   --   CREATININE 0.96  --  0.87  --   --   --  0.93 0.90  --   --   GFRNONAA >60  --  >60  --   --   --  >60 >60  --   --   GFRAA >60  --  >60  --   --   --  >60 >60  --   --    < > = values in this interval not displayed.    LIVER FUNCTION TESTS: Recent Labs    08/27/19 0230 08/28/19 0500 08/29/19 0457  09/03/19 0834  BILITOT 0.1* 0.9 0.8 0.8  AST 21 29 22 23   ALT 21 24 21 17   ALKPHOS 36* 46 42 58  PROT 4.9* 5.7* 5.5* 6.3*  ALBUMIN 2.4* 2.7* 2.4* 2.2*    Assessment and Plan: Right ICA cervical/petrous junction stenosis s/p revascularization using balloon angioplasty via right radial and right femoral approach6/14/2021by Dr. Estanislado Pandy. Mild-moderate R RP hematoma, moderate right thigh/soft tissue hematoma.  Again re-intubated after trial extubation yesterday.  Was unable to maintain on bipap alone-- respiratory failure vs. Mental status? Neuro exam unchanged, although now sedated.  Still no movement noted with left side.   She was taken off all anticoagulants/antiplatlets in the setting of acute bleed with critical anemia. Hemoglobin stable at 5.1  Ongoing respiratory compromise with concern for need for prolonged support.  Appreciate CCM assistance with discussion with family and Palliative care consult initiation.  We will of course make effort to be available to participate in these conversations with family.  No family at bedside this AM.   Continue to hold Brilinta/Aspirin.  Appreciate CCM/neurology/TRH/vascular surgery assistance with management. NIR to follow.  Electronically Signed: Docia Barrier, PA 09/04/2019, 11:50 AM   I spent a total of 15 Minutes at the the patient's bedside AND on the patient's hospital floor or unit, greater than 50% of which was counseling/coordinating care for R ICA stenosis, R RP hematoma, R thigh hematoma.

## 2019-09-04 NOTE — Progress Notes (Signed)
SLP Cancellation Note  Patient Details Name: NADIYAH ZEIS MRN: [id number] DOB: February 25, 1951   Cancelled treatment:       Reason Eval/Treat Not Completed: Medical issues which prohibited therapy. Pt was extubated on previous date but has since been reintubated due to respiratory distress. She is on the vent, sedated. SLP to sign off at this time. Please reorder when ready.     Mahala Menghini., M.A. CCC-SLP Acute Rehabilitation Services Pager [phone] Office [phone]  09/04/2019, 7:57 AM

## 2019-09-04 NOTE — Progress Notes (Signed)
NAME:  Donna [name], MRN:  [id number], DOB:  06-04-1950, LOS: 22 ADMISSION DATE:  08/25/2019, CONSULTATION DATE:  08/27/2019 REFERRING MD:  Dr. 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  Was placed on ASA/ brillinta.  Monitored in Neuro ICU.  Noted to have developed hypotension, increased left sided weakness and lethargy on 6/15 am with Hgb drop from 13.3 to 6.9 with new AKI.  Post MRI/ MRA showed scattered small acute infarcts along the right MCA/ watershed territory suspected to be resultant either post procedure vs hypoperfusion.  Neurology was consulted with recommendations to keep SBP > 140 for cerebral perfusion.  A CT abd/ pelvis was obtained given concern for retroperitoneal bleed which showed a mild to moderate amount of retroperitoneal hematoma in the right iliac and inguinal regiones with moderate size hematoma noted the the soft tissues anterior to the musculature of the right hip; no definite intrapertioneal hemorrhage. INR, PT, and platelets rechecked and were wnl.  Vascular surgery was consulted with plans to observe given bleeding was mostly into the muscle and tissue.  She was started on neosynephrine to keep SBP goal > 140-160 for cerebral perfusion.  PCCM consulted to help with vasopressor support.  S/p 1L bolus  Repeat Hgb now 6.9-> 7-> 5.1.   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    ETT 6/19 >>  Significant Diagnostic Tests:   6/15 MRA/ MRI brain  >> 1. Scattered small acute infarcts along the right MCA/watershed territory. Solitary small acute infarct in the left parietal cortex and right cerebellum. 2. Improved right ICA patency at the petrous segment. 3. Known severe right M1 segment stenosis. 4. 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  6/15 CT a/p wo contrast >> 1. Mild to moderate amount of retroperitoneal hematoma is noted in the right iliac and inguinal regions. Moderate size hematoma is noted in the soft tissues anterior to the musculature of the right hip. No definite intraperitoneal hemorrhage is noted. 2. Moderate size fat containing periumbilical hernia. Aortic Atherosclerosis   Echo 6/16 >> normal LVEF  Micro Data:  6/10 SARS 2 >> neg 6/14 MRSA PCR >> neg  Antimicrobials:  6/14 cefazolin pre-op  6/20 unasyn >> 09/02/2019  Interim history/subjective:   reintubated overnight.   Objective   Blood pressure (!) 134/49, pulse 60, temperature 98.7 F (37.1 C), temperature source Axillary, resp. rate 18, height 5\' 1"  (1.549 m), weight 75.3 kg, SpO2 100 %.    Vent Mode: PRVC FiO2 (%):  [40 %-50 %] 40 % Set Rate:  [8 bmp-18 bmp] 18 bmp Vt Set:  [410 mL] 410 mL PEEP:  [5 cmH20-6 cmH20] 5 cmH20 Pressure Support:  [5 cmH20] 5 cmH20 Plateau Pressure:  [14 cmH20-20 cmH20] 18 cmH20   Intake/Output  Summary (Last 24 hours) at 09/04/2019 0820 Last data filed at 09/04/2019 0800 Gross per 24 hour  Intake 2209.08 ml  Output 1050 ml  Net 1159.08 ml   Filed Weights   08/25/19 0705 08/30/19 0500 09/04/19 0500  Weight: 68.5 kg 77.5 kg 75.3 kg    Examination:  General: elderly fm,  intubated on mv  HEENT: NCAT, sclera clear  Neck: no jvd  Lungs: BL vented breaths Heart: RRR, s1 s2  Abdomen: soft, nd nt  Neuro: no following commands Extremities: no edema   Chest x-ray 09/04/2019: reviewed, ETT in place  Labs: labs every other day    Resolved Hospital Problem list   Hypotension   Assessment & Plan:   s/p RT ICA cer/petrous junction balloon angioplasty for severe stenosis. Post-procedure with residual stenosis of approx 25 %. Severe stenosis of RT MCA M1 seg with improved flow post angioplasty Right MCA watershed territory infarct Small left frontal cortex and right cerebellum  P:  - per neuro IR   Acute respiratory failure with hypoxia - -CXR with pulm edema, mild BUL opacities  -Mild upper airway pseudowheeze 6/18, diuresed and given Solu-Medrol for stridor -Reintubated for acute hypercarbic respiratory failure 6/20 with bilateral infiltrates?  Aspiration versus edema , favor aspiration given leukocytosis P - reintubated  - may need prolonged support  - not sure she can clear her airway  - I called and spoke with the patients son this morning. He is going to talk to his brother.  - I consulted palliative care to help with this discussion   Acute metabolic encephalopathy - -in setting of CNS depressing medications, R MCA CVA P - supportive care - delirium precautions    Acute blood loss anemia - Jehovah's Witness: no blood products including albumin - CTA Abd/Pelvis 08/26/19 with hematoma of the right groin and retroperitoneum with expansion to the right rectus femoris expansion. No active bleeding S/p -  feraheme x2 doses P - H&H stable  - supportive care   AKI -resolved  DM, type II - uncontrolled P:  Lantus, ssi and cbgs   Hypokalemia and hypophosphatemia P: -replete as needed   Nutrition:  cortrack   Best practice:  Diet: NPO  Pain/Anxiety/Delirium protocol (if indicated): n/a VAP protocol (if indicated): n/a DVT prophylaxis:  SCDs only in setting of acute anemia/blood loss GI prophylaxis: Pepcid Glucose control: CBG q 4 Mobility: BR Code Status: Full  Family Communication: - son, Evaristo Bury at [phone]  Disposition: ICU  This patient is critically ill with multiple organ system failure; which, requires frequent high complexity decision making, assessment, support, evaluation, and titration of therapies. This was completed through the application of advanced monitoring technologies and extensive interpretation of multiple databases. During this encounter critical care time was devoted to patient care services described in this note for 32 minutes.  Josephine Igo, DO [facility] 09/04/2019 8:20 AM

## 2019-09-05 ENCOUNTER — Ambulatory Visit: Payer: Self-pay

## 2019-09-05 ENCOUNTER — Telehealth: Payer: Medicare HMO

## 2019-09-05 DIAGNOSIS — Z7189 Other specified counseling: Secondary | ICD-10-CM

## 2019-09-05 DIAGNOSIS — Z515 Encounter for palliative care: Secondary | ICD-10-CM

## 2019-09-05 DIAGNOSIS — E11319 Type 2 diabetes mellitus with unspecified diabetic retinopathy without macular edema: Secondary | ICD-10-CM

## 2019-09-05 DIAGNOSIS — Z9911 Dependence on respirator [ventilator] status: Secondary | ICD-10-CM

## 2019-09-05 DIAGNOSIS — Z978 Presence of other specified devices: Secondary | ICD-10-CM

## 2019-09-05 DIAGNOSIS — E785 Hyperlipidemia, unspecified: Secondary | ICD-10-CM

## 2019-09-05 DIAGNOSIS — I10 Essential (primary) hypertension: Secondary | ICD-10-CM

## 2019-09-05 LAB — COMPREHENSIVE METABOLIC PANEL
ALT: 21 U/L (ref 0–44)
AST: 26 U/L (ref 15–41)
Albumin: 2 g/dL — ABNORMAL LOW (ref 3.5–5.0)
Alkaline Phosphatase: 56 U/L (ref 38–126)
Anion gap: 9 (ref 5–15)
BUN: 27 mg/dL — ABNORMAL HIGH (ref 8–23)
CO2: 31 mmol/L (ref 22–32)
Calcium: 8.6 mg/dL — ABNORMAL LOW (ref 8.9–10.3)
Chloride: 110 mmol/L (ref 98–111)
Creatinine, Ser: 0.75 mg/dL (ref 0.44–1.00)
GFR calc Af Amer: >60 mL/min (ref >60)
GFR calc non Af Amer: >60 mL/min (ref >60)
Glucose, Bld: 290 mg/dL — ABNORMAL HIGH (ref 70–99)
Potassium: 3.6 mmol/L (ref 3.5–5.1)
Sodium: 150 mmol/L — ABNORMAL HIGH (ref 135–145)
Total Bilirubin: 1 mg/dL (ref 0.3–1.2)
Total Protein: 5.8 g/dL — ABNORMAL LOW (ref 6.5–8.1)

## 2019-09-05 LAB — CBC
HCT: 17.7 % — ABNORMAL LOW (ref 36.0–46.0)
Hemoglobin: 4.8 g/dL — CL (ref 12.0–15.0)
MCH: 29.8 pg (ref 26.0–34.0)
MCHC: 27.1 g/dL — ABNORMAL LOW (ref 30.0–36.0)
MCV: 109.9 fL — ABNORMAL HIGH (ref 80.0–100.0)
Platelets: 184 10*3/uL (ref 150–400)
RBC: 1.61 MIL/uL — ABNORMAL LOW (ref 3.87–5.11)
RDW: 19.8 % — ABNORMAL HIGH (ref 11.5–15.5)
WBC: 8 10*3/uL (ref 4.0–10.5)
nRBC: 4.2 % — ABNORMAL HIGH (ref 0.0–0.2)

## 2019-09-05 LAB — GLUCOSE, CAPILLARY
Glucose-Capillary: 114 mg/dL — ABNORMAL HIGH (ref 70–99)
Glucose-Capillary: 234 mg/dL — ABNORMAL HIGH (ref 70–99)
Glucose-Capillary: 258 mg/dL — ABNORMAL HIGH (ref 70–99)

## 2019-09-05 MED ORDER — FREE WATER
300.0000 mL | Status: DC
  Administered 2019-09-05 – 2019-09-07 (×13): 300 mL

## 2019-09-05 MED ORDER — INSULIN ASPART 100 UNIT/ML ~~LOC~~ SOLN
11.0000 [IU] | SUBCUTANEOUS | Status: DC
  Administered 2019-09-05 – 2019-09-07 (×7): 11 [IU] via SUBCUTANEOUS

## 2019-09-05 MED ORDER — DEXTROSE 50 % IV SOLN
25.0000 mL | Freq: Once | INTRAVENOUS | Status: AC
  Administered 2019-09-05: 25 mL via INTRAVENOUS

## 2019-09-05 MED ORDER — DEXTROSE 50 % IV SOLN
INTRAVENOUS | Status: AC
  Filled 2019-09-05: qty 50

## 2019-09-05 NOTE — Progress Notes (Addendum)
NAME:  Donna [name], MRN:  [id number], DOB:  Feb 23, 1951, LOS: 11 ADMISSION DATE:  08/25/2019, CONSULTATION DATE:  08/27/2019 REFERRING MD:  Dr. 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  Was placed on ASA/ brillinta.  Monitored in Neuro ICU.  Noted to have developed hypotension, increased left sided weakness and lethargy on 6/15 am with Hgb drop from 13.3 to 6.9 with new AKI.  Post MRI/ MRA showed scattered small acute infarcts along the right MCA/ watershed territory suspected to be resultant either post procedure vs hypoperfusion.  Neurology was consulted with recommendations to keep SBP > 140 for cerebral perfusion.  A CT abd/ pelvis was obtained given concern for retroperitoneal bleed which showed a mild to moderate amount of retroperitoneal hematoma in the right iliac and inguinal regiones with moderate size hematoma noted the the soft tissues anterior to the musculature of the right hip; no definite intrapertioneal hemorrhage. INR, PT, and platelets rechecked and were wnl.  Vascular surgery was consulted with plans to observe given bleeding was mostly into the muscle and tissue.  She was started on neosynephrine to keep SBP goal > 140-160 for cerebral perfusion.  PCCM consulted to help with vasopressor support.  S/p 1L bolus  Repeat Hgb now 6.9-> 7-> 5.1.   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    ETT 6/19 >6/23,  6/23>>  Significant Diagnostic Tests:   6/15 MRA/ MRI brain  >> 1. Scattered small acute infarcts along the right MCA/watershed territory. Solitary small acute infarct in the left parietal cortex and right cerebellum. 2. Improved right ICA patency at the petrous segment. 3. Known severe right M1 segment stenosis. 4. 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  6/15 CT a/p wo contrast >> 1. Mild to moderate amount of retroperitoneal hematoma is noted in the right iliac and inguinal regions. Moderate size hematoma is noted in the soft tissues anterior to the musculature of the right hip. No definite intraperitoneal hemorrhage is noted. 2. Moderate size fat containing periumbilical hernia. Aortic Atherosclerosis   Echo 6/16 >> normal LVEF  Micro Data:  6/10 SARS 2 >> neg 6/14 MRSA PCR >> neg  Antimicrobials:  6/14 cefazolin pre-op  6/20 unasyn >> 09/02/2019  Interim history/subjective:  Remains intubated Weaning on PSV/CPAP this morning, following R sided commands on precedex but seems marginal/weak  Objective   Blood pressure (!) 147/40, pulse 70, temperature 99.5 F (37.5 C), temperature source Axillary, resp. rate 18, height 5\' 1"  (1.549 m), weight 75.4 kg, SpO2 100 %.    Vent Mode: PRVC FiO2 (%):  [40 %] 40 % Set Rate:  [18 bmp-180 bmp] 180 bmp Vt Set:  [410 mL] 410 mL PEEP:  [5 cmH20]  5 cmH20 Plateau Pressure:  [13 cmH20-20 cmH20] 20 cmH20   Intake/Output Summary (Last 24 hours) at 09/05/2019 0722 Last data filed at 09/05/2019 0700 Gross per 24 hour  Intake 2354.96 ml  Output 1200 ml  Net 1154.96 ml   Filed Weights   08/30/19 0500 09/04/19 0500  09/05/19 0500  Weight: 77.5 kg 75.3 kg 75.4 kg    Examination:  General: elderly chronically and critically ill appearing F, intubated sedated NAD  HEENT: NCAT  Pink tacky mm. ETT and cortrak secure. Anicteric sclera Lungs: Symmetrical chest expansion, no adventitious sounds. Even, unlabored respirations of PSV/CPAP  Heart: RRR s1s2. Cap refill < 3 seconds  Abdomen: soft, nd nt  Neuro: Lightly sedated on dexmet. Awakens to voice. Following commands RUE RLE. Can lift head off pillow, nod/shake head. Does not stick out tongue on command nor cough on command. Cough when suctioned weak.  Extremities: LUE dependent edema. No cyanosis or clubbing   Resolved Hospital Problem list   Hypotension  AKI  Assessment & Plan:   s/p RT ICA cer/petrous junction balloon angioplasty for severe stenosis. Post-procedure with residual stenosis of approx 25 %. Severe stenosis of RT MCA M1 seg with improved flow post angioplasty Right MCA watershed territory infarct Small left frontal cortex and right cerebellum  P:  - per neuro IR (primary team) - stroke team has signed off   Acute respiratory failure with hypoxia - -CXR with pulm edema, mild BUL opacities  -Mild upper airway pseudowheeze 6/18, diuresed and given Solu-Medrol for stridor -Reintubated for acute hypercarbic respiratory failure 6/20 with bilateral infiltrates?  Aspiration versus edema , favor aspiration given leukocytosis P -WUA/SBT -PRN CXR -VAP, PAD, pulm hygiene -not sure she can clear her airway and anticipate we are looking at prolonged support (trach/vent peg) vs decision to pursue palliation  -Palliative care consulted 6/24 for assistance.  Acute metabolic encephalopathy - -in setting of CNS depressing medications, R MCA CVA P - supportive care - delirium precautions    Acute blood loss anemia - Jehovah's Witness: no blood products including albumin - CTA Abd/Pelvis 08/26/19 with hematoma of the right groin and  retroperitoneum with expansion to the right rectus femoris expansion. No active bleeding S/p -  feraheme x2 doses -interval drop in H/H -- no s/sx ongoing bleeding, likely component of dilution as well as anemia of critical illness  P - PRN CBC, limit labs as able - supportive care   DM, type II - uncontrolled P:  Lantus, ssi and cbgs   Hypokalemia, improved Hypernatremia  P: -replete as needed  -FWF to q4hr   Nutrition:  -cortrack   Debilitation  -PT/OT Best practice:  Diet: EN per RDN  Pain/Anxiety/Delirium protocol (if indicated): precedex, fentanyl  VAP protocol (if indicated): yes  DVT prophylaxis: SCDs only  GI prophylaxis: Pepcid Glucose control: CBG q 4 Mobility: PT/OT  Code Status: Full  Family Communication: pending 6/25 - son, Evaristo Bury at [phone]  Disposition: ICU  CRITICAL CARE Performed by: Lanier Clam   Total critical care time: 43 minutes  Critical care time was exclusive of separately billable procedures and treating other patients. Critical care was necessary to treat or prevent imminent or life-threatening deterioration.  Critical care was time spent personally by me on the following activities: development of treatment plan with patient and/or surrogate as well as nursing, discussions with consultants, evaluation of patient's response to treatment, examination of patient, obtaining history from patient or surrogate, ordering and performing treatments and interventions, ordering  and review of laboratory studies, ordering and review of radiographic studies, pulse oximetry and re-evaluation of patient's condition.   Eliseo Gum MSN, AGACNP-BC [place] [phone] If no answer, [phone] 09/05/2019, 7:22 AM    PCCM Attending:   69 yo FM, HTN HLD, CVA. S/p RT ICU balloon angioplasty. Had RP bleeding from sheath. Now confused. Failed coming off the vent.   BP (!) 142/60   Pulse 89   Temp 99 F (37.2  C) (Axillary)   Resp 19   Ht 5\' 1"  (1.549 m)   Wt 75.4 kg   SpO2 100%   BMI 31.41 kg/m   Gen: elderly fm HENT: NCAT, not tracking  Heart: RRR, s1 s2 Lungs: on vent, bl vented breath sounds   Labs: reviewed   A:  Right ICA, severe stenosis  Right MCA watershed infarct  AHRF on Vent  Acute blood loss anemia  RP bleed and hematoma   P: pallative care discussions in place  Would agree with one-way extubation when family ready  We appreciate pallative input. I called and spoke with the patients sons yesterday  H&H stable continue to follow No products due to religoius belief  This patient is critically ill with multiple organ system failure; which, requires frequent high complexity decision making, assessment, support, evaluation, and titration of therapies. This was completed through the application of advanced monitoring technologies and extensive interpretation of multiple databases. During this encounter critical care time was devoted to patient care services described in this note for 32 minutes.  Garner Nash, DO [facility] 09/05/2019 11:23 AM

## 2019-09-05 NOTE — Progress Notes (Signed)
Inpatient Diabetes Program Recommendations  AACE/ADA: New Consensus Statement on Inpatient Glycemic Control (2015)  Target Ranges:  Prepandial:   less than 140 mg/dL      Peak postprandial:   less than 180 mg/dL (1-2 hours)      Critically ill patients:  140 - 180 mg/dL   Lab Results  Component Value Date   GLUCAP 234 (H) 09/05/2019   HGBA1C 11.2 (H) 05/22/2019    Review of Glycemic Control Results for MYKENZIE, EBANKS (MRN [id number]) as of 09/05/2019 11:54  Ref. Range 09/04/2019 19:35 09/04/2019 23:35 09/05/2019 04:25 09/05/2019 07:35  Glucose-Capillary Latest Ref Range: 70 - 99 mg/dL 82 606 (H) 004 (H) 599 (H)   Diabetes history: Type 2 DM Current orders for Inpatient glycemic control: Novolog 0-15 units Q4H, Novolog 11 units Q4H, Lantus 20 units QD Vital  Inpatient Diabetes Program Recommendations:    Noted insulin increase to tube feeds this AM. No steroids admin.   With CBg trends yesterday and concern for hypoglycemia, would consider changing tube feed coverage back to Novolog 8 units Q4H.  Thanks, Lujean Rave, MSN, RNC-OB Diabetes Coordinator [phone] (8a-5p)

## 2019-09-05 NOTE — Consult Note (Signed)
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 C/B retroperitoneal bleeding.   Palliative care was asked to aid in goals of care conversations in the setting of patient having failed weaning trials from ventilatory support.    Clinical Assessment/Goals of Care: I have reviewed medical records including EPIC notes, labs and imaging, received report from bedside RN, assessed the patient.    I called Philisha Weinel (son) to further discuss diagnosis prognosis, GOC, EOL wishes, disposition and options.   I introduced Palliative Medicine as specialized medical care for people living with serious illness. It focuses on providing relief from the symptoms and stress of a serious illness. The goal is to improve quality of life for both the patient and the family.  I asked [name] to tell me about his mother. He shares that she is a widow, her husband died of arterial blockage suddenly. They share three children, all boys. Akela worked at [employer] for may years, recently she was forced to retire in the setting of recurrent strokes. She then went to live with her son, [name] where she has been for the last nine months. He works six days a week as a Administrator though his son is also present with [employer] during the day. He states that she is a loving woman who likes to cook and dance. [name] shares that Bethany is an avid Jehovah Witness and will not accept blood products  unless they are fractionated.   [name] expressed frustration that his mother is in her present state. He shares that he wishes he did not send her in for surgery. He states that she is now in a much worse position than she was initially. I allowed him time to express himself. I provided therapeutic listening and reassurance that he truly was offering his mother the best care prior to admission and acting in her best interests with the pursuit of surgery.   I asked [name] what he understood about his mothers current health state. He shares that she was "doing well" then was unable to move her arm well and was progressively more weak in the setting of blood loss. He knows that at the present time her situation is not looking good, but he expresses difficulty coming to terms with this reality.  A detailed discussion was had today regarding advanced directives, [name] shares that there are none formally though he and his two other brother make decisions on his mothers behalf.     Concepts specific to code status, artifical feeding and hydration, continued IV antibiotics and rehospitalization was had.  The difference between a aggressive medical intervention path  and a palliative comfort care path for this patient at this time was had. Eran remains full code presently. I shared with [name] that at this point it is important that he and his brothers determine what would be an acceptable quality of life to [place]. Whether she would wish to be on a ventilator for a prolonged period with a tracheostomy. We reviewed that if Monick's heart  were to stop performing CPR would likely result in terrible trauma to her body. He stated understanding. He plans to meet with his brother this afternoon to discuss these topics in greater detail. I offered to be present as a support if needed. I requested a medical update for he and his brother as well.   Discussed the importance of continued conversation with family and  their  medical providers regarding overall plan of care and treatment options, ensuring decisions are within the context of the patients values and GOCs.  Decision Maker: Deshanti Adcox (Son) - [phone]  SUMMARY OF RECOMMENDATIONS   Full Code, full scope of care for the time being  Ongoing GOC conversations, appreciate medical team updates  Plan for family meeting tomorrow around noon  Chaplain to provide ongoing JW support  Code Status/Advance Care Planning: FULL CODE   Palliative Prophylaxis:   Oral Care, Turn Q2H, Constipation  Additional Recommendations (Limitations, Scope, Preferences):  Continue current scope of care   Psycho-social/Spiritual:   Desire for further Chaplaincy support: Yes, Jehovah's Witness  Additional Recommendations: Education on Palliative Care    Prognosis: Poor  Discharge Planning: Unclear  PPS: 10%    This conversation/these recommendations were discussed with patient primary care team, Dr. Corliss Skains and Dr. Tonia Brooms  Time In: 0910 Time Out: 1020 Total Time: 70 Greater than 50%  of this time was spent counseling and coordinating care related to the above assessment and plan.  Lamarr Lulas [place] Palliative Medicine Team Team Cell Phone: [phone] Please utilize secure chat with additional questions, if there is no response within 30 minutes please call the above phone number  Palliative Medicine Team providers are available by phone from 7am to 7pm daily and can be reached through the team cell phone.  Should this patient require assistance outside of these hours, please call the patient's attending physician.

## 2019-09-05 NOTE — Progress Notes (Signed)
Referring Physician(s): Melvenia Beam  Supervising Physician: Luanne Bras  Patient Status:  [facility] - In-pt  Chief Complaint: None- intubated with sedation  Subjective:  Right ICA cervical/petrous junction stenosis s/p revascularization using balloon angioplasty via right radial and right femoral approach6/14/2021by Dr. Estanislado Pandy. Patientintubated with sedation.She opens eyes to voice and quickly falls back asleep Can spontaneously move right sidewith left-sided neglect. Right groin and right radial puncture sites c/d/i. Most recent hgbthis AM 4.8 (down from 5.1 09/03/2019). No family at bedside this AM.   Allergies: Patient has no known allergies.  Medications: Prior to Admission medications   Medication Sig Start Date End Date Taking? Authorizing Provider  amLODipine (NORVASC) 10 MG tablet Take 1 tablet (10 mg total) by mouth daily. 05/12/19  Yes Minette Brine, FNP  aspirin EC 81 MG tablet Take 81 mg by mouth every evening.   Yes [provider]  Calcium Carbonate-Vitamin D (CALCIUM-D PO) Take 2 tablets by mouth daily at 12 noon.    Yes [provider]  carvedilol (COREG) 6.25 MG tablet TAKE 1 TABLET(6.25 MG) BY MOUTH TWICE DAILY Patient taking differently: Take 6.25 mg by mouth 2 (two) times daily with a meal.  07/31/19  Yes Minette Brine, FNP  hydrALAZINE (APRESOLINE) 50 MG tablet Take 1 tablet (50 mg total) by mouth 2 (two) times daily. 06/16/19 09/14/19 Yes Elouise Munroe, MD  Insulin Degludec-Liraglutide (XULTOPHY) 100-3.6 UNIT-MG/ML SOPN Inject 30 Units into the skin daily. 08/14/19  Yes Minette Brine, FNP  telmisartan-hydrochlorothiazide (MICARDIS HCT) 40-12.5 MG tablet Take 1 tablet by mouth daily. 07/31/19  Yes Minette Brine, FNP  ticagrelor (BRILINTA) 90 MG TABS tablet Take 1 tablet (90 mg total) by mouth 2 (two) times daily. 06/12/19  Yes Elouise Munroe, MD  vitamin C (ASCORBIC ACID) 250 MG tablet Take 500 mg by mouth daily at 12 noon.    Yes [provider]  blood glucose meter kit and supplies KIT Dispense based on patient and insurance preference. Use up to four times daily as directed. (FOR ICD-9 250.00, 250.01). 05/10/18   Minette Brine, FNP  Blood Glucose Monitoring Suppl (TRUE METRIX METER) w/Device KIT 1 each by Does not apply route in the morning, at noon, in the evening, and at bedtime. 05/22/19   Minette Brine, FNP  Evolocumab (REPATHA SURECLICK) 706 MG/ML SOAJ Inject 140 mg into the skin every 14 (fourteen) days. 06/30/19   Elouise Munroe, MD  glucose blood (TRUE METRIX BLOOD GLUCOSE TEST) test strip Check blood sugar 4 times a day before meals and bedtime 05/22/19   Minette Brine, FNP  Insulin Glargine-Lixisenatide (SOLIQUA) 100-33 UNT-MCG/ML SOPN Inject 25 Units into the skin daily. Patient taking differently: Inject 30 Units into the skin daily.  07/14/19   Minette Brine, FNP  Multiple Vitamin (MULTIVITAMIN PO) Take 1 tablet by mouth daily at 12 noon.     [provider]  Omega-3 Fatty Acids (OMEGA-3 PLUS PO) Take 2 capsules by mouth daily at 12 noon.    [provider]     Vital Signs: BP (!) 143/47 (BP Location: Left Arm)   Pulse 75   Temp 99 F (37.2 C) (Axillary)   Resp 16   Ht 5' 1"  (1.549 m)   Wt 166 lb 3.6 oz (75.4 kg)   SpO2 100%   BMI 31.41 kg/m   Physical Exam Vitals and nursing note reviewed.  Constitutional:      General: She is not in acute distress.    Comments:  Intubated with sedation.  Pulmonary:     Effort: Pulmonary effort is normal. No respiratory distress.     Comments: Intubated with sedation. Skin:    General: Skin is warm and dry.     Comments: Right groin puncture site soft without active bleeding or hematoma. Right radial puncture site soft without active bleeding or hematoma.     Neurological:     Comments: Intubated with sedation. She opens eyes to voice and quickly falls back asleep. PERRL bilaterally. Can spontaneously move right side but no  spontaneous movements of left side (left-sided neglect). Distal pulses (DPs) 1+ bilaterally and (radial) 2+ bilaterally.       Imaging: DG Chest Port 1 View  Result Date: 09/03/2019 CLINICAL DATA:  Respiratory failure EXAM: PORTABLE CHEST 1 VIEW COMPARISON:  09/01/2019 FINDINGS: Support Apparatus: --Endotracheal tube: Tip 1.5 cm above the inferior margin of the carina. --Enteric tube:Tip and sideport are below the field of view. --Catheter(s):Right internal jugular vein approach central venous catheter tip is in lower SVC. --Other: None The heart size and mediastinal contours are within normal limits. The lungs are better aerated. No pleural effusion or pneumothorax. IMPRESSION: 1. Endotracheal tube tip 1.5 cm above the inferior margin of the carina. 2. Improved aeration of the lungs. Electronically Signed   By: Ulyses Jarred M.D.   On: 09/03/2019 21:44    Labs:  CBC: Recent Labs    08/29/19 0457 08/31/19 0107 08/31/19 0216 09/03/19 0834 09/03/19 2106 09/05/19 0618  WBC 10.2  --  18.4* 9.1  --  8.0  HGB 4.7*   < > 5.0* 5.1* 5.1* 4.8*  HCT 15.6*   < > 16.8* 18.5* 15.0* 17.7*  PLT 177  --  246 229  --  184   < > = values in this interval not displayed.    COAGS: Recent Labs    11/27/18 0728 08/18/19 0705 08/26/19 1715 08/27/19 0230  INR 0.9 0.9 1.1 1.2    BMP: Recent Labs    08/29/19 0457 08/31/19 0107 08/31/19 0216 08/31/19 0216 09/03/19 0834 09/03/19 1633 09/03/19 2106 09/05/19 0618  NA 147*   < > 148*   < > 162* 158* 159* 150*  K 2.8*   < > 4.5  --  3.6  --  3.4* 3.6  CL 112*  --  113*  --  117*  --   --  110  CO2 27  --  28  --  35*  --   --  31  GLUCOSE 177*  --  326*  --  111*  --   --  290*  BUN 14  --  33*  --  23  --   --  27*  CALCIUM 8.2*  --  8.5*  --  9.2  --   --  8.6*  CREATININE 0.87  --  0.93  --  0.90  --   --  0.75  GFRNONAA >60  --  >60  --  >60  --   --  >60  GFRAA >60  --  >60  --  >60  --   --  >60   < > = values in this interval not  displayed.    LIVER FUNCTION TESTS: Recent Labs    08/28/19 0500 08/29/19 0457 09/03/19 0834 09/05/19 0618  BILITOT 0.9 0.8 0.8 1.0  AST 29 22 23 26   ALT 24 21 17 21   ALKPHOS 46 42 58 56  PROT 5.7* 5.5* 6.3* 5.8*  ALBUMIN  2.7* 2.4* 2.2* 2.0*    Assessment and Plan:  Right ICA cervical/petrous junction stenosis s/p revascularization using balloon angioplasty via right radial and right femoral approach6/14/2021by Dr. Estanislado Pandy. Mild-moderate right thigh (with some retroperitoneal component) hematoma. Patient's condition stable- remainsintubated with sedation(weaning),opens eyes to voice and follows simple commands, has spontaneous movements of right side but no spontaneous movements of left side. Right groin and right radial puncture sites stable, no signs of active bleeding. Ongoing discussions between CCM/palliative care and patient's sons regarding ongoing respiratory compromise (possible tracheostomy)- appreciate assistance with this. Continue to hold Brilinta/Aspirin.Agree with CCM plans to trent labs QOD to avoid excess blood drawl. Further plans per CCM/neurology/palliative care- appreciate and agree with management. NIR to follow.   Electronically Signed: Earley Abide, PA-C 09/05/2019, 8:44 AM   I spent a total of 25 Minutes at the the patient's bedside AND on the patient's hospital floor or unit, greater than 50% of which was counseling/coordinating care for right ICA stenosis s/p revascularization.

## 2019-09-06 LAB — GLUCOSE, CAPILLARY
Glucose-Capillary: 156 mg/dL — ABNORMAL HIGH (ref 70–99)
Glucose-Capillary: 73 mg/dL (ref 70–99)
Glucose-Capillary: 101 mg/dL — ABNORMAL HIGH (ref 70–99)
Glucose-Capillary: 130 mg/dL — ABNORMAL HIGH (ref 70–99)
Glucose-Capillary: 63 mg/dL — ABNORMAL LOW (ref 70–99)
Glucose-Capillary: 140 mg/dL — ABNORMAL HIGH (ref 70–99)
Glucose-Capillary: 202 mg/dL — ABNORMAL HIGH (ref 70–99)
Glucose-Capillary: 155 mg/dL — ABNORMAL HIGH (ref 70–99)
Glucose-Capillary: 77 mg/dL (ref 70–99)
Glucose-Capillary: 106 mg/dL — ABNORMAL HIGH (ref 70–99)
Glucose-Capillary: 94 mg/dL (ref 70–99)

## 2019-09-06 NOTE — Progress Notes (Signed)
NAME:  Donna [name], MRN:  [id number], DOB:  Mar 24, 1950, LOS: 12 ADMISSION DATE:  08/25/2019, CONSULTATION DATE:  08/27/2019 REFERRING MD:  Dr. 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  Was placed on ASA/ brillinta.  Monitored in Neuro ICU.  Noted to have developed hypotension, increased left sided weakness and lethargy on 6/15 am with Hgb drop from 13.3 to 6.9 with new AKI.  Post MRI/ MRA showed scattered small acute infarcts along the right MCA/ watershed territory suspected to be resultant either post procedure vs hypoperfusion.  Neurology was consulted with recommendations to keep SBP > 140 for cerebral perfusion.  A CT abd/ pelvis was obtained given concern for retroperitoneal bleed which showed a mild to moderate amount of retroperitoneal hematoma in the right iliac and inguinal regiones with moderate size hematoma noted the the soft tissues anterior to the musculature of the right hip; no definite intrapertioneal hemorrhage. INR, PT, and platelets rechecked and were wnl.  Vascular surgery was consulted with plans to observe given bleeding was mostly into the muscle and tissue.  She was started on neosynephrine to keep SBP goal > 140-160 for cerebral perfusion.  PCCM consulted to help with vasopressor support.  S/p 1L bolus  Repeat Hgb now 6.9-> 7-> 5.1.   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    ETT 6/19 >6/23,  6/23>>  Significant Diagnostic Tests:   6/15 MRA/ MRI brain  >> 1. Scattered small acute infarcts along the right MCA/watershed territory. Solitary small acute infarct in the left parietal cortex and right cerebellum. 2. Improved right ICA patency at the petrous segment. 3. Known severe right M1 segment stenosis. 4. 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  6/15 CT a/p wo contrast >> 1. Mild to moderate amount of retroperitoneal hematoma is noted in the right iliac and inguinal regions. Moderate size hematoma is noted in the soft tissues anterior to the musculature of the right hip. No definite intraperitoneal hemorrhage is noted. 2. Moderate size fat containing periumbilical hernia. Aortic Atherosclerosis   Echo 6/16 >> normal LVEF  Micro Data:  6/10 SARS 2 >> neg 6/14 MRSA PCR >> neg  Antimicrobials:  6/14 cefazolin pre-op  6/20 unasyn >> 09/02/2019  Interim history/subjective:  Remains intubated Weaning on PSV/CPAP this morning, moves right side to request on precedex but seems marginal/weak  Objective   Blood pressure (!) 157/58, pulse 100, temperature 99.8 F (37.7 C), temperature source Axillary, resp. rate (!) 24, height 5\' 1"  (1.549 m), weight 78 kg, SpO2 100 %.    Vent Mode: PSV;CPAP FiO2 (%):  [40 %] 40 % Set Rate:  [18 bmp] 18 bmp Vt Set:  [410 mL] 410 mL PEEP:  [5  cmH20] 5 cmH20 Pressure Support:  [5 KDT26-71 cmH20] 5 cmH20 Plateau Pressure:  [17 cmH20-18 cmH20] 18 cmH20   Intake/Output Summary (Last 24 hours) at 09/06/2019 1319 Last data filed at 09/06/2019 1200 Gross per 24 hour  Intake 2581.16 ml  Output 975 ml  Net 1606.16 ml    Filed Weights   09/04/19 0500 09/05/19 0500 09/06/19 0500  Weight: 75.3 kg 75.4 kg 78 kg    Examination:  General: elderly chronically and critically ill appearing F, intubated sedated but arousable on dexmetatomidine NAD  HEENT: NCAT  Pink tacky mm. ETT and cortrak secure. Anicteric sclera Lungs: Symmetrical chest expansion, no adventitious sounds. Even, unlabored respirations of PSV/CPAP  Heart: RRR s1s2. Cap refill < 3 seconds  Abdomen: soft, nd nt  Neuro: Lightly sedated on dexmet. Awakens to voice. Following commands RUE RLE. Flacid LUE, LLE. Can lift head off pillow, nod/shake head. Does not stick out tongue on command nor cough on command. Minimal tracking left. Cough when suctioned weak. +gag, but minimal Extremities: LUE dependent edema. No cyanosis or clubbing   Resolved Hospital Problem list   Hypotension  AKI  Assessment & Plan:   s/p RT ICA cer/petrous junction balloon angioplasty for severe stenosis. Post-procedure with residual stenosis of approx 25 %. Severe stenosis of RT MCA M1 seg with improved flow post angioplasty Right MCA watershed territory infarct Small left frontal cortex and right cerebellum  P:  - per neuro IR (primary team) - stroke team has signed off   Acute respiratory failure with hypoxia - -CXR with pulm edema, mild BUL opacities  -Mild upper airway pseudowheeze 6/18, diuresed and given Solu-Medrol for stridor -Reintubated for acute hypercarbic respiratory failure 6/20 with bilateral infiltrates?  Aspiration versus edema , favor aspiration given leukocytosis P -WUA/SBT -PRN CXR -VAP, PAD, pulm hygiene -not sure she can clear her airway and anticipate we are looking at prolonged support (trach/vent peg) vs decision to pursue palliation  -Palliative care consulted 6/24 for assistance;meeting with family today?  Acute metabolic encephalopathy - -in setting of CNS depressing medications, R MCA CVA P - supportive care - delirium precautions     Acute blood loss anemia - Jehovah's Witness: no blood products including albumin - CTA Abd/Pelvis 08/26/19 with hematoma of the right groin and retroperitoneum with expansion to the right rectus femoris expansion. No active bleeding S/p -  feraheme x2 doses -interval drop in H/H -- no s/sx ongoing bleeding, likely component of dilution as well as anemia of critical illness  P - PRN CBC, limit labs as able - supportive care   DM, type II - uncontrolled P:  Lantus, ssi and cbgs   Hypokalemia, improved Hypernatremia  P: -replete as needed  -FWF to 371ml q4hr   Nutrition:  -cortrack   Debilitation  -PT/OT Best practice:  Diet: EN per RDN  Pain/Anxiety/Delirium protocol (if indicated): precedex, fentanyl  VAP protocol (if indicated): yes  DVT prophylaxis: SCDs only  GI prophylaxis: Pepcid Glucose control: CBG q 4 Mobility: PT/OT  Code Status: Full  Family Communication: pending 6/25 - son, [name] at [phone]  Disposition: ICU  CRITICAL CARE Performed by: Bonna Gains  This patient is critically ill with multiple organ system failure; which, requires frequent high complexity decision making, assessment, support, evaluation, and titration of therapies. This was completed through the application of advanced monitoring technologies and extensive interpretation of multiple databases. During this encounter critical care time was devoted to patient care services described in this note for 32 minutes.  Gwynne Edinger, MD PhD  09/06/2019 1:19 PM

## 2019-09-06 NOTE — Progress Notes (Signed)
 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 C/B retroperitoneal bleeding.   Palliative care was asked to aid in goals of care conversations in the setting of patient having failed weaning trials from ventilatory support.   Today's Discussion (09/06/2019): I met this afternoon with Donna [name], Donna [name], Donna [name], Donna [name], Donna [name], and Donna [name].   I introduced Palliative Medicine as specialized medical care for people living with serious illness. It focuses on providing relief from the symptoms and stress of a serious illness. The goal is to improve quality of life for both the patient and the family.  I was joined by patients bedside RN, Amy. We started the meeting with introductions. I then asked for Amy who has worked with Donna [name] over the last three days to share her insights from being at the bedside.   Patients son, Donna [name] expressed frustration with the medical team. He very much so feels that there was negligence on the part of the medical team and that if the retroperitoneal bleed had been caught sooner all of this could be avoided. He vocalizes feelings of anger about the situation we are now in. I allowed him time to express these feelings.   Donna [name]'s cousin, Donna [name] requested information on an array of topics inclusive of who performed the patients procedures. Together we reviewed diagnostic imagine results, lab results, and the medication record. Donna [name] was quite  medically literate. She requested to know why a fecal occult blood, iron panel had not been completed. She requested to know why Donna [name] was not on a higher dose or aranesp.  We reviewed the dosing changes that the patient had had since hospitalization.   We reviewed in detail the patient hemoglobin levels since admission and post procedure. We talked about the importance of maintaining without blood products as a Jehovah's Witness. Donna [name] brought up that [facility] is a "blood free" hospital and has a population of JW patients that they may opt to treat with better knowledge of their faith. She and her family feel that if Donna [name] gets to a point where she is stable enough it would be worthwhile to arrange for transfer there. I shared that this is not something that I do though I can bring it to the attention of the medical team.   He discussed the importance for appropriate hemoglobin level for oxygenation and how without that you can be quite impaired and compromise other organ systems.   Donna [name] remained to offer feeling upset by the whole situation sharing that he only has one mother and does not want to give up on her. I vocalized understanding of this. He wants to know if anything further could be done to improve her health state. We discussed the possibly scenarios moving forward. I shared with him that whatever we opt to do we should be mindful of what his mother would want in the long term.   Donna [name] and his family have requested a formal meeting with the critical care physician. I shared that we will try to coordinate   this via a telephone line so that everyone may call in to be present.   Donna [name] said that Donna [name] likes to watch the [employer] channel and requested that we put this on for her to enjoy.   Donna [name] completed a list of participants for the meeting this upcoming week. I was able to explain to her what a tracheostmy and g-tube would look like. We discussed LTACH settings where Donna [name] would  likely need to go.  We commenced the meeting agreeing to be in contact to schedule the telephone meeting at the beginning of the week. I shared that I need to first verify when the critical care team would be available.  The family was very thankful for the time of myself and Amy, RN  Decision Maker: Donna [name] Hallums (Son) - [phone]  SUMMARY OF RECOMMENDATIONS Full Code, full scope of care for the time being  Plan for a meeting with family and critical care team  Family interested in a transfer to [facility]  Chaplain to provide ongoing JW support  Time Spent: 120 Greater than 50% of the time was spent in counseling and coordination of care ______________________________________________________________________________________    Palliative Medicine Team Team Cell Phone: [phone] Please utilize secure chat with additional questions, if there is no response within 30 minutes please call the above phone number  Palliative Medicine Team providers are available by phone from 7am to 7pm daily and can be reached through the team cell phone.  Should this patient require assistance outside of these hours, please call the patient's attending physician.     

## 2019-09-06 NOTE — Progress Notes (Signed)
   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 C/B retroperitoneal bleeding.   Palliative care was asked to aid in goals of care conversations in the setting of patient having failed weaning trials from ventilatory support.   Today's Discussion (09/06/2019): Chart reviewed. I met with patients bedside RN, Amy at bedside who vocalizes that there had not been much change as compared to the day prior. Donna [name] remains intubated and sedated.  I called [name] this morning to confirm the plan to meet at noon. He stated that he has been working since [employer] and needs to take a nap. He shares that he plans to be here later in the day though is unable to commit to a specific time. I again summarize our conversation from the day prior and the importance of all of [place] getting together to talk about the plan moving forward.   Discussed the importance of continued conversation with family and their  medical providers regarding overall plan of care and treatment options, ensuring decisions are within the context of the patients values and GOCs.  Questions and concerns addressed   Decision Maker: Donna [name] (Son) [phone]  SUMMARY OF RECOMMENDATIONS Full Code, full scope of care for the time being  Awaiting family to confirm meeting time, patients son Donna [name] stated he would arrive today at noon though he later said he could not be here until later in the day  and would call to arrange a time  Chaplain to provide ongoing JW support  Time Spent: 25 Greater than 50% of the time was spent in counseling and coordination of care ______________________________________________________________________________________ [place] Team Team Cell Phone: [phone] Please utilize secure chat with additional questions, if there is no response within 30 minutes please call the above phone number  Palliative Medicine Team providers are available by phone from 7am to 7pm daily and can be reached through the team cell phone.  Should this patient require assistance outside of these hours, please call the patient's attending physician.

## 2019-09-06 NOTE — Progress Notes (Signed)
Referring Physician(s): Melvenia Beam  Supervising Physician: Luanne Bras  Patient Status:  Donna [name] - In-pt  Chief Complaint: None- intubated with sedation  Subjective:  Right ICA cervical/petrous junction stenosis s/p revascularization using balloon angioplasty via right radial and right femoral approach6/14/2021by Dr. Estanislado Pandy. Patientintubated with sedation.She opens eyes to voice. Spontaneously move right sidewith left-sided neglect. Right groin and right radial puncture sites c/d/i. Most recent hgbthis AM 4.8 (down from 5.1 09/03/2019). No new labs today. No family at bedside this AM.   Allergies: Patient has no known allergies.  Medications:  Current Facility-Administered Medications:  .  0.9 %  sodium chloride infusion, 250 mL, Intravenous, Continuous, Akingbade, Jimi O, MD, Last Rate: 10 mL/hr at 09/06/19 0800, Rate Verify at 09/06/19 0800 .  acetaminophen (TYLENOL) tablet 650 mg, 650 mg, Oral, Q4H PRN **OR** acetaminophen (TYLENOL) 160 MG/5ML solution 650 mg, 650 mg, Per Tube, Q4H PRN, 650 mg at 09/05/19 2133 **OR** acetaminophen (TYLENOL) suppository 650 mg, 650 mg, Rectal, Q4H PRN, [name], Sanjeev, MD .  albuterol (PROVENTIL) (2.5 MG/3ML) 0.083% nebulizer solution 2.5 mg, 2.5 mg, Nebulization, Q4H PRN, Anders Simmonds, MD .  chlorhexidine gluconate (MEDLINE KIT) (PERIDEX) 0.12 % solution 15 mL, 15 mL, Mouth Rinse, BID, Corey Harold, NP, 15 mL at 09/06/19 0733 .  Chlorhexidine Gluconate Cloth 2 % PADS 6 each, 6 each, Topical, Daily, [name], Sanjeev, MD, 6 each at 09/06/19 0400 .  Darbepoetin Alfa (ARANESP) injection 100 mcg, 100 mcg, Subcutaneous, Q Wed-1800, Ramaswamy, Murali, MD, 100 mcg at 09/03/19 1840 .  dexmedetomidine (PRECEDEX) 400 MCG/100ML (4 mcg/mL) infusion, 0.4-1.2 mcg/kg/hr, Intravenous, Titrated, Rigoberto Noel, MD, Last Rate: 9.69 mL/hr at 09/06/19 0800, 0.5 mcg/kg/hr at 09/06/19 0800 .  docusate (COLACE) 50 MG/5ML liquid 100 mg,  100 mg, Per Tube, BID, [name], Sanjeev, MD, 100 mg at 09/05/19 0925 .  enoxaparin (LOVENOX) injection 40 mg, 40 mg, Subcutaneous, Q24H, Garvin Fila, MD, 40 mg at 09/05/19 0926 .  famotidine (PEPCID) tablet 20 mg, 20 mg, Per Tube, Daily, Rigoberto Noel, MD, 20 mg at 09/05/19 0925 .  feeding supplement (PRO-STAT SUGAR FREE 64) liquid 30 mL, 30 mL, Per Tube, Daily, Icard, Bradley L, DO, 30 mL at 09/05/19 0925 .  feeding supplement (VITAL AF 1.2 CAL) liquid 1,000 mL, 1,000 mL, Per Tube, Continuous, Icard, Bradley L, DO, Last Rate: 50 mL/hr at 09/06/19 0815, 1,000 mL at 09/06/19 0815 .  fentaNYL (SUBLIMAZE) injection 25-100 mcg, 25-100 mcg, Intravenous, Q2H PRN, Rigoberto Noel, MD, 100 mcg at 09/06/19 0402 .  free water 300 mL, 300 mL, Per Tube, Q4H, Bowser, Grace E, NP, 300 mL at 09/06/19 0736 .  insulin aspart (novoLOG) injection 0-15 Units, 0-15 Units, Subcutaneous, Q4H, Jennelle Human B, NP, 5 Units at 09/06/19 0734 .  insulin aspart (novoLOG) injection 11 Units, 11 Units, Subcutaneous, Q4H, Bowser, Laurel Dimmer, NP, 11 Units at 09/06/19 0734 .  insulin glargine (LANTUS) injection 20 Units, 20 Units, Subcutaneous, Daily, Icard, Bradley L, DO, 20 Units at 09/05/19 0925 .  ipratropium-albuterol (DUONEB) 0.5-2.5 (3) MG/3ML nebulizer solution 3 mL, 3 mL, Nebulization, Q4H PRN, Collier Bullock, MD, 3 mL at 08/28/19 1527 .  labetalol (NORMODYNE) injection 10 mg, 10 mg, Intravenous, Q2H PRN, Merlene Laughter, Kofi, MD, 10 mg at 09/05/19 0616 .  MEDLINE mouth rinse, 15 mL, Mouth Rinse, 10 times per day, Corey Harold, NP, 15 mL at 09/06/19 0557 .  sodium chloride flush (NS) 0.9 % injection 10-40 mL, 10-40 mL, Intracatheter, Q12H,  Luanne Bras, MD, 10 mL at 09/05/19 2135 .  sodium chloride flush (NS) 0.9 % injection 10-40 mL, 10-40 mL, Intracatheter, PRN, Luanne Bras, MD    Vital Signs: BP (!) 143/55 (BP Location: Left Arm)   Pulse 68   Temp 98.5 F (36.9 C) (Axillary)   Resp 17   Ht _0   (1.549 m)   Wt 78 kg   SpO2 100%   BMI 32.49 kg/m   Physical Exam Vitals and nursing note reviewed.  Constitutional:      General: She is not in acute distress.    Comments: Intubated with sedation.  Pulmonary:     Effort: Pulmonary effort is normal. No respiratory distress.     Comments: Intubated with sedation. Skin:    General: Skin is warm and dry.     Comments: Right groin puncture site soft without active bleeding or hematoma. Right radial puncture site soft without active bleeding or hematoma.     Neurological:     Comments: Intubated with sedation. She opens eyes to voice and quickly falls back asleep. PERRL bilaterally. Can spontaneously move right side but no spontaneous movements of left side (left-sided neglect). Distal pulses (DPs) 1+ bilaterally and (radial) 2+ bilaterally.       Imaging: DG Chest Port 1 View  Result Date: 09/03/2019 CLINICAL DATA:  Respiratory failure EXAM: PORTABLE CHEST 1 VIEW COMPARISON:  09/01/2019 FINDINGS: Support Apparatus: --Endotracheal tube: Tip 1.5 cm above the inferior margin of the carina. --Enteric tube:Tip and sideport are below the field of view. --Catheter(s):Right internal jugular vein approach central venous catheter tip is in lower SVC. --Other: None The heart size and mediastinal contours are within normal limits. The lungs are better aerated. No pleural effusion or pneumothorax. IMPRESSION: 1. Endotracheal tube tip 1.5 cm above the inferior margin of the carina. 2. Improved aeration of the lungs. Electronically Signed   By: Ulyses Jarred M.D.   On: 09/03/2019 21:44    Labs:  CBC: Recent Labs    08/29/19 0457 08/31/19 0107 08/31/19 0216 09/03/19 0834 09/03/19 2106 09/05/19 0618  WBC 10.2  --  18.4* 9.1  --  8.0  HGB 4.7*   < > 5.0* 5.1* 5.1* 4.8*  HCT 15.6*   < > 16.8* 18.5* 15.0* 17.7*  PLT 177  --  246 229  --  184   < > = values in this interval not displayed.    COAGS: Recent Labs    11/27/18 0728  08/18/19 0705 08/26/19 1715 08/27/19 0230  INR 0.9 0.9 1.1 1.2    BMP: Recent Labs    08/29/19 0457 08/31/19 0107 08/31/19 0216 08/31/19 0216 09/03/19 0834 09/03/19 1633 09/03/19 2106 09/05/19 0618  NA 147*   < > 148*   < > 162* 158* 159* 150*  K 2.8*   < > 4.5  --  3.6  --  3.4* 3.6  CL 112*  --  113*  --  117*  --   --  110  CO2 27  --  28  --  35*  --   --  31  GLUCOSE 177*  --  326*  --  111*  --   --  290*  BUN 14  --  33*  --  23  --   --  27*  CALCIUM 8.2*  --  8.5*  --  9.2  --   --  8.6*  CREATININE 0.87  --  0.93  --  0.90  --   --  0.75  GFRNONAA >60  --  >60  --  >60  --   --  >60  GFRAA >60  --  >60  --  >60  --   --  >60   < > = values in this interval not displayed.    LIVER FUNCTION TESTS: Recent Labs    08/28/19 0500 08/29/19 0457 09/03/19 0834 09/05/19 0618  BILITOT 0.9 0.8 0.8 1.0  AST _0 ALT _1 ALKPHOS 46 42 58 56  PROT 5.7* 5.5* 6.3* 5.8*  ALBUMIN 2.7* 2.4* 2.2* 2.0*    Assessment and Plan:  Right ICA cervical/petrous junction stenosis s/p revascularization using balloon angioplasty via right radial and right femoral approach6/14/2021by Dr. Estanislado Pandy. Mild-moderate right thigh (with some retroperitoneal component) hematoma. Patient's condition stable- remainsintubated with sedation(weaning),opens eyes to voice and follows simple commands, has spontaneous movements of right side but no spontaneous movements of left side. Right groin and right radial puncture sites stable, no signs of active bleeding. Ongoing discussions between CCM/palliative care and patient's sons regarding ongoing respiratory compromise (possible tracheostomy)- appreciate assistance with this. Continue to hold Brilinta/Aspirin.Agree with CCM plans to trent labs QOD to avoid excess blood drawl. Further plans per CCM/neurology/palliative care- appreciate and agree with management. NIR to follow.   Electronically Signed: Ascencion Dike,  PA-C 09/06/2019, 9:18 AM   I spent a total of 25 Minutes at the the patient's bedside AND on the patient's hospital floor or unit, greater than 50% of which was counseling/coordinating care for right ICA stenosis s/p revascularization.

## 2019-09-07 LAB — COMPREHENSIVE METABOLIC PANEL
ALT: 28 U/L (ref 0–44)
AST: 34 U/L (ref 15–41)
Albumin: 1.8 g/dL — ABNORMAL LOW (ref 3.5–5.0)
Alkaline Phosphatase: 46 U/L (ref 38–126)
Anion gap: 8 (ref 5–15)
BUN: 23 mg/dL (ref 8–23)
CO2: 28 mmol/L (ref 22–32)
Calcium: 8.1 mg/dL — ABNORMAL LOW (ref 8.9–10.3)
Chloride: 108 mmol/L (ref 98–111)
Creatinine, Ser: 0.76 mg/dL (ref 0.44–1.00)
GFR calc Af Amer: >60 mL/min (ref >60)
GFR calc non Af Amer: >60 mL/min (ref >60)
Glucose, Bld: 148 mg/dL — ABNORMAL HIGH (ref 70–99)
Potassium: 3.4 mmol/L — ABNORMAL LOW (ref 3.5–5.1)
Sodium: 144 mmol/L (ref 135–145)
Total Bilirubin: 0.8 mg/dL (ref 0.3–1.2)
Total Protein: 5.5 g/dL — ABNORMAL LOW (ref 6.5–8.1)

## 2019-09-07 LAB — CBC
HCT: 15.7 % — ABNORMAL LOW (ref 36.0–46.0)
Hemoglobin: 4.5 g/dL — CL (ref 12.0–15.0)
MCH: 30.2 pg (ref 26.0–34.0)
MCHC: 28.7 g/dL — ABNORMAL LOW (ref 30.0–36.0)
MCV: 105.4 fL — ABNORMAL HIGH (ref 80.0–100.0)
Platelets: 175 10*3/uL (ref 150–400)
RBC: 1.49 MIL/uL — ABNORMAL LOW (ref 3.87–5.11)
RDW: 20.8 % — ABNORMAL HIGH (ref 11.5–15.5)
WBC: 8.4 10*3/uL (ref 4.0–10.5)
nRBC: 1.9 % — ABNORMAL HIGH (ref 0.0–0.2)

## 2019-09-07 LAB — GLUCOSE, CAPILLARY
Glucose-Capillary: 52 mg/dL — ABNORMAL LOW (ref 70–99)
Glucose-Capillary: 83 mg/dL (ref 70–99)
Glucose-Capillary: 134 mg/dL — ABNORMAL HIGH (ref 70–99)
Glucose-Capillary: 117 mg/dL — ABNORMAL HIGH (ref 70–99)
Glucose-Capillary: 159 mg/dL — ABNORMAL HIGH (ref 70–99)
Glucose-Capillary: 169 mg/dL — ABNORMAL HIGH (ref 70–99)
Glucose-Capillary: 197 mg/dL — ABNORMAL HIGH (ref 70–99)

## 2019-09-07 MED ORDER — FREE WATER
30.0000 mL | Status: DC
  Administered 2019-09-07 – 2019-09-13 (×36): 30 mL

## 2019-09-07 MED ORDER — DEXTROSE 10 % IV SOLN
INTRAVENOUS | Status: DC
  Filled 2019-09-07: qty 1000

## 2019-09-07 MED ORDER — POTASSIUM CHLORIDE 20 MEQ PO PACK
40.0000 meq | PACK | Freq: Once | ORAL | Status: AC
  Administered 2019-09-07: 40 meq via ORAL
  Filled 2019-09-07: qty 2

## 2019-09-07 NOTE — Progress Notes (Signed)
Referring Physician(s): Melvenia Beam  Supervising Physician: Luanne Bras  Patient Status:  [facility] - In-pt  Chief Complaint: None- intubated with sedation  Subjective:  Right ICA cervical/petrous junction stenosis s/p revascularization using balloon angioplasty via right radial and right femoral approach6/14/2021by Dr. Estanislado Pandy. Patientintubated with sedation. Spontaneously move right sidewith left-sided neglect. Right groin and right radial puncture sites c/d/i. Most recent hgbthis AM 4.5 (down from 5.1 09/03/2019). No family at bedside this AM.   Allergies: Patient has no known allergies.  Medications:  Current Facility-Administered Medications:  .  0.9 %  sodium chloride infusion, 250 mL, Intravenous, Continuous, Akingbade, Jimi O, MD, Last Rate: 10 mL/hr at 09/06/19 1800, Rate Verify at 09/06/19 1800 .  acetaminophen (TYLENOL) tablet 650 mg, 650 mg, Oral, Q4H PRN **OR** acetaminophen (TYLENOL) 160 MG/5ML solution 650 mg, 650 mg, Per Tube, Q4H PRN, 650 mg at 09/07/19 0759 **OR** acetaminophen (TYLENOL) suppository 650 mg, 650 mg, Rectal, Q4H PRN, [name], Sanjeev, MD .  albuterol (PROVENTIL) (2.5 MG/3ML) 0.083% nebulizer solution 2.5 mg, 2.5 mg, Nebulization, Q4H PRN, Anders Simmonds, MD .  chlorhexidine gluconate (MEDLINE KIT) (PERIDEX) 0.12 % solution 15 mL, 15 mL, Mouth Rinse, BID, Corey Harold, NP, 15 mL at 09/07/19 0747 .  Chlorhexidine Gluconate Cloth 2 % PADS 6 each, 6 each, Topical, Daily, Luanne Bras, MD, 6 each at 09/06/19 2140 .  Darbepoetin Alfa (ARANESP) injection 100 mcg, 100 mcg, Subcutaneous, Q Wed-1800, Ramaswamy, Murali, MD, 100 mcg at 09/03/19 1840 .  dexmedetomidine (PRECEDEX) 400 MCG/100ML (4 mcg/mL) infusion, 0.4-1.2 mcg/kg/hr, Intravenous, Titrated, Rigoberto Noel, MD, Last Rate: 13.56 mL/hr at 09/07/19 0800, 0.7 mcg/kg/hr at 09/07/19 0800 .  docusate (COLACE) 50 MG/5ML liquid 100 mg, 100 mg, Per Tube, BID, [name], Sanjeev,  MD, 100 mg at 09/06/19 2140 .  enoxaparin (LOVENOX) injection 40 mg, 40 mg, Subcutaneous, Q24H, Garvin Fila, MD, 40 mg at 09/06/19 0934 .  famotidine (PEPCID) tablet 20 mg, 20 mg, Per Tube, Daily, Rigoberto Noel, MD, 20 mg at 09/06/19 0935 .  feeding supplement (PRO-STAT SUGAR FREE 64) liquid 30 mL, 30 mL, Per Tube, Daily, Icard, Bradley L, DO, 30 mL at 09/06/19 0935 .  feeding supplement (VITAL AF 1.2 CAL) liquid 1,000 mL, 1,000 mL, Per Tube, Continuous, Icard, Bradley L, DO, Last Rate: 50 mL/hr at 09/07/19 0726, 1,000 mL at 09/07/19 0726 .  fentaNYL (SUBLIMAZE) injection 25-100 mcg, 25-100 mcg, Intravenous, Q2H PRN, Rigoberto Noel, MD, 100 mcg at 09/06/19 1439 .  free water 30 mL, 30 mL, Per Tube, Q4H, Icard, Bradley L, DO .  insulin aspart (novoLOG) injection 0-15 Units, 0-15 Units, Subcutaneous, Q4H, Jennelle Human B, NP, 2 Units at 09/07/19 0746 .  insulin glargine (LANTUS) injection 20 Units, 20 Units, Subcutaneous, Daily, Icard, Bradley L, DO, 20 Units at 09/06/19 0934 .  ipratropium-albuterol (DUONEB) 0.5-2.5 (3) MG/3ML nebulizer solution 3 mL, 3 mL, Nebulization, Q4H PRN, Collier Bullock, MD, 3 mL at 08/28/19 1527 .  labetalol (NORMODYNE) injection 10 mg, 10 mg, Intravenous, Q2H PRN, Merlene Laughter, Kofi, MD, 10 mg at 09/05/19 0616 .  MEDLINE mouth rinse, 15 mL, Mouth Rinse, 10 times per day, Corey Harold, NP, 15 mL at 09/07/19 0554 .  sodium chloride flush (NS) 0.9 % injection 10-40 mL, 10-40 mL, Intracatheter, Q12H, [name], Sanjeev, MD, 10 mL at 09/06/19 0936 .  sodium chloride flush (NS) 0.9 % injection 10-40 mL, 10-40 mL, Intracatheter, PRN, Luanne Bras, MD    Vital Signs: BP (!) 133/51 (  BP Location: Left Arm)   Pulse 79   Temp 99.3 F (37.4 C) (Axillary)   Resp 20   Ht '5\' 1"'$  (1.549 m)   Wt 80 kg   SpO2 100%   BMI 33.32 kg/m   Physical Exam Vitals and nursing note reviewed.  Constitutional:      General: She is not in acute distress.    Comments: Intubated  with sedation.  Pulmonary:     Effort: Pulmonary effort is normal. No respiratory distress.     Comments: Intubated with sedation. Skin:    General: Skin is warm and dry.     Comments: Right groin puncture site soft without active bleeding or hematoma. Right radial puncture site soft without active bleeding or hematoma.     Neurological:     Comments: Intubated with sedation. Did not arouse this am. PERRL bilaterally. Can spontaneously move right side but no spontaneous movements of left side (left-sided neglect). Distal pulses (DPs) 1+ bilaterally and (radial) 2+ bilaterally.       Imaging: DG Chest Port 1 View  Result Date: 09/03/2019 CLINICAL DATA:  Respiratory failure EXAM: PORTABLE CHEST 1 VIEW COMPARISON:  09/01/2019 FINDINGS: Support Apparatus: --Endotracheal tube: Tip 1.5 cm above the inferior margin of the carina. --Enteric tube:Tip and sideport are below the field of view. --Catheter(s):Right internal jugular vein approach central venous catheter tip is in lower SVC. --Other: None The heart size and mediastinal contours are within normal limits. The lungs are better aerated. No pleural effusion or pneumothorax. IMPRESSION: 1. Endotracheal tube tip 1.5 cm above the inferior margin of the carina. 2. Improved aeration of the lungs. Electronically Signed   By: Ulyses Jarred M.D.   On: 09/03/2019 21:44    Labs:  CBC: Recent Labs    08/31/19 0216 08/31/19 0216 09/03/19 0834 09/03/19 2106 09/05/19 0618 09/07/19 0512  WBC 18.4*  --  9.1  --  8.0 8.4  HGB 5.0*   < > 5.1* 5.1* 4.8* 4.5*  HCT 16.8*   < > 18.5* 15.0* 17.7* 15.7*  PLT 246  --  229  --  184 175   < > = values in this interval not displayed.    COAGS: Recent Labs    11/27/18 0728 08/18/19 0705 08/26/19 1715 08/27/19 0230  INR 0.9 0.9 1.1 1.2    BMP: Recent Labs    08/31/19 0216 08/31/19 0216 09/03/19 0834 09/03/19 0834 09/03/19 1633 09/03/19 2106 09/05/19 0618 09/07/19 0512  NA 148*   < > 162*    < > 158* 159* 150* 144  K 4.5   < > 3.6  --   --  3.4* 3.6 3.4*  CL 113*  --  117*  --   --   --  110 108  CO2 28  --  35*  --   --   --  31 28  GLUCOSE 326*  --  111*  --   --   --  290* 148*  BUN 33*  --  23  --   --   --  27* 23  CALCIUM 8.5*  --  9.2  --   --   --  8.6* 8.1*  CREATININE 0.93  --  0.90  --   --   --  0.75 0.76  GFRNONAA >60  --  >60  --   --   --  >60 >60  GFRAA >60  --  >60  --   --   --  >60 >  60   < > = values in this interval not displayed.    LIVER FUNCTION TESTS: Recent Labs    08/29/19 0457 09/03/19 0834 09/05/19 0618 09/07/19 0512  BILITOT 0.8 0.8 1.0 0.8  AST 22 23 26  34  ALT 21 17 21 28   ALKPHOS 42 58 56 46  PROT 5.5* 6.3* 5.8* 5.5*  ALBUMIN 2.4* 2.2* 2.0* 1.8*    Assessment and Plan:  Right ICA cervical/petrous junction stenosis s/p revascularization using balloon angioplasty via right radial and right femoral approach6/14/2021by Dr. Estanislado Pandy. Mild-moderate right thigh (with some retroperitoneal component) hematoma. Patient's condition stable- remainsintubated with sedation(weaning),opens eyes to voice and follows simple commands, has spontaneous movements of right side but no spontaneous movements of left side. Right groin and right radial puncture sites stable, no signs of active bleeding. Hgb cont slow trend down, now 4.5 Ongoing discussions between CCM/palliative care and patient's sons regarding ongoing respiratory compromise (possible tracheostomy)- appreciate assistance with this. Continue to hold Brilinta/Aspirin. Will report to Dr. Estanislado Pandy   Electronically Signed: Ascencion Dike, PA-C 09/07/2019, 8:54 AM   I spent a total of 25 Minutes at the the patient's bedside AND on the patient's hospital floor or unit, greater than 50% of which was counseling/coordinating care for right ICA stenosis s/p revascularization.

## 2019-09-07 NOTE — Progress Notes (Addendum)
   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   Palliative care was asked to aid in goals of care conversations in the setting of patient having failed weaning trials from ventilatory support.  Today's Discussion (09/07/2019): Chart reviewed. I met with patients son, Donna [name] at bedside. He shared with me that he was very upset that ferritin had not been given daily and that aranesp was not being titrated.He remained to say that someone needs to take responsibility for his mother present health state. I told him the plan to meet with Dr. Estanislado Pandy so that he could better understand things from the vascular interventional radiology perspective.  Donna [name], his brother and sister in law all met [place] in the conference room on 4N. Dr. Mercy Riding explained the procedure he performed in great detail. He described the reasons why a patient may be anemic post-operatively and most especially in [name] case how things were far more complicated being that she could not receive blood products. He described the production of red blood cells and how a lack of them can result in multiple organ dysfunction/failure.   There were some questions such as why was iron not given every day and why was the aranesp dose not increased more liberally that were unable to be answered during this meeting.  The decision was made to have an additional meeting  tomorrow with all of [name] family present on a conference line to ask their questions.   Discussed the importance of continued conversation with family and their  medical providers regarding overall plan of care and treatment options, ensuring decisions are within the context of the patients values and GOCs.  Questions and concerns addressed   Decision Maker: Donna [name] (Son) [phone]  SUMMARY OF RECOMMENDATIONS Full Code, full scope of care for the time being  Plan for a meeting tomorrow at 1230 with Vascular interventional Neurology and Critical Care team  Family interested in a transfer to [facility] to provide ongoing JW support Recommendations and Plan:  Discussed with Dr. Estanislado Pandy  Time Spent: 48 Greater than 50% of the time was spent in counseling and coordination of care ______________________________________________________________________________________ [name] Team Team Cell Phone: [phone] Please utilize secure chat with additional questions, if there is no response within 30 minutes please call the above phone number  Palliative Medicine Team providers are available by phone from 7am to 7pm daily and can be reached through the team cell phone.  Should this patient require assistance outside of these hours, please call the patient's attending physician.

## 2019-09-07 NOTE — Progress Notes (Signed)
NAME:  Donna [name], MRN:  [id number], DOB:  01-02-1951, LOS: 13 ADMISSION DATE:  08/25/2019, CONSULTATION DATE:  08/27/2019 REFERRING MD:  Dr. 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  Was placed on ASA/ brillinta.  Monitored in Neuro ICU.  Noted to have developed hypotension, increased left sided weakness and lethargy on 6/15 am with Hgb drop from 13.3 to 6.9 with new AKI.  Post MRI/ MRA showed scattered small acute infarcts along the right MCA/ watershed territory suspected to be resultant either post procedure vs hypoperfusion.  Neurology was consulted with recommendations to keep SBP > 140 for cerebral perfusion.  A CT abd/ pelvis was obtained given concern for retroperitoneal bleed which showed a mild to moderate amount of retroperitoneal hematoma in the right iliac and inguinal regiones with moderate size hematoma noted the the soft tissues anterior to the musculature of the right hip; no definite intrapertioneal hemorrhage. INR, PT, and platelets rechecked and were wnl.  Vascular surgery was consulted with plans to observe given bleeding was mostly into the muscle and tissue.  She was started on neosynephrine to keep SBP goal > 140-160 for cerebral perfusion.  PCCM consulted to help with vasopressor support.  S/p 1L bolus  Repeat Hgb now 6.9-> 7-> 5.1.   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    ETT 6/19 >6/23,  6/23>>  Significant Diagnostic Tests:   6/15 MRA/ MRI brain  >> 1. Scattered small acute infarcts along the right MCA/watershed territory. Solitary small acute infarct in the left parietal cortex and right cerebellum. 2. Improved right ICA patency at the petrous segment. 3. Known severe right M1 segment stenosis. 4. 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  6/15 CT a/p wo contrast >> 1. Mild to moderate amount of retroperitoneal hematoma is noted in the right iliac and inguinal regions. Moderate size hematoma is noted in the soft tissues anterior to the musculature of the right hip. No definite intraperitoneal hemorrhage is noted. 2. Moderate size fat containing periumbilical hernia. Aortic Atherosclerosis   Echo 6/16 >> normal LVEF  Micro Data:  6/10 SARS 2 >> neg 6/14 MRSA PCR >> neg  Antimicrobials:  6/14 cefazolin pre-op  6/20 unasyn >> 09/02/2019  Interim history/subjective:   Still weak. Ok in PSV. Family discussions underway.   Objective   Blood pressure (!) 136/51, pulse 81, temperature 99 F (37.2 C), temperature source Oral, resp. rate (!) 22, height 5\' 1"  (1.549 m), weight 80 kg, SpO2 100 %.    Vent Mode: PSV;CPAP FiO2 (%):  [40 %] 40 % Set Rate:  [18 bmp] 18 bmp Vt Set:  [410 mL] 410 mL PEEP:  [5 cmH20] 5 cmH20 Pressure Support:  [5  [place] Pressure:  [18 [id number] cmH20] 20 cmH20   Intake/Output Summary (Last 24 hours) at 09/07/2019 0757 Last data filed at 09/07/2019 0700 Gross per 24 hour  Intake 2946.95 ml  Output 1025 ml  Net 1921.95 ml   Filed Weights   09/05/19 0500 09/06/19 0500 09/07/19 0500  Weight: 75.4 kg  78 kg 80 kg    Examination:  General: elderly FM, intubated on life support  HEENT: NCAT, tracks Lungs: BL vented breaths  Heart: RRR, s1 s2  Abdomen: soft, nt nd  Neuro: alert to voice, hemiplegic on left  Extremities: LUE edema   Resolved Hospital Problem list   Hypotension  AKI  Assessment & Plan:   s/p RT ICA cer/petrous junction balloon angioplasty for severe stenosis. Post-procedure with residual stenosis of approx 25 %. Severe stenosis of RT MCA M1 seg with improved flow post angioplasty Right MCA watershed territory infarct Small left frontal cortex and right cerebellum  P:  - Neuro IR Primary  - I suggest that NeuroIR meet with family regarding prognosis and recoverability of her stroke   Acute respiratory failure with hypoxia - -CXR with pulm edema, mild BUL opacities  -Mild upper airway pseudowheeze 6/18, diuresed and given Solu-Medrol for stridor -Reintubated for acute hypercarbic respiratory failure 6/20 with bilateral infiltrates?  Aspiration versus edema , favor aspiration given leukocytosis P - continue palliative care discussions - if family desires prolonged support then she will likely need tracheostomy and peg tube placement   Acute metabolic encephalopathy - -in setting of CNS depressing medications, R MCA CVA P - remains on minimal precedex for comfort  - mental status following cns injury has not improved enough to consider liberation from vent   Acute blood loss anemia - Jehovah's Witness: no blood products including albumin - CTA Abd/Pelvis 08/26/19 with hematoma of the right groin and retroperitoneum with expansion to the right rectus femoris expansion. No active bleeding S/p -  feraheme x2 doses -interval drop in H/H -- no s/sx ongoing bleeding, likely component of dilution as well as anemia of critical illness  P - supportive care - avoid un-necessary blood draws   DM, type II - uncontrolled P:  lantus plus ssi Stopped scheduled TF  coverage  Hypokalemia, improved Hypernatremia, resolved  P: - replete as needed   Nutrition:  - cortrack and TFs   Debilitation  - PT/OT if and when able   Best practice:  Diet: EN per RDN  Pain/Anxiety/Delirium protocol (if indicated): precedex, VAP protocol (if indicated): yes  DVT prophylaxis: SCDs only  GI prophylaxis: Pepcid Glucose control: CBG q 4 Mobility: PT/OT  Code Status: Full  Family Communication: son, [name] at [phone]  Disposition: ICU   This patient is critically ill with multiple organ system failure; which, requires frequent high complexity decision making, assessment, support, evaluation, and titration of therapies. This was completed through the application of advanced monitoring technologies and extensive interpretation of multiple databases. During this encounter critical care time was devoted to patient care services described in this note for 31 minutes.  Garner Nash, DO [facility] 09/07/2019 7:57 AM

## 2019-09-07 NOTE — Progress Notes (Signed)
eLink Physician-Brief Progress Note Patient Name: Donna [name] DOB: 24-Nov-1950 MRN: [id number]   Date of Service  09/07/2019  HPI/Events of Note  Hypoglycemia - Blood glucose = 56. Currently on Lantus + moderate Novolog SSI  eICU Interventions  Plan: 1. D10W to run IV at 40 mL/hour.      Intervention Category Major Interventions: Other:  Lenell Antu 09/07/2019, 3:55 AM

## 2019-09-07 NOTE — Progress Notes (Signed)
PT Cancellation/Discharge Note  Patient Details Name: Donna [name] MRN: [id number] DOB: 01-26-1951   Cancelled Treatment:    Reason Eval/Treat Not Completed: Medical issues which prohibited therapy   Noted Hgb remains critically low and remains intubated. Per last communication with Dr. Pearlean Brownie, await extubation and may be able to proceed slowly with therapy/activity based on her symptoms.   Will sign-off for now and await re-order if/when pt is extubated or deemed appropriate by neuro.   Jerolyn Center, PT Pager [phone]    Scherrie November Maylie Ashton 09/07/2019, 10:10 AM

## 2019-09-08 ENCOUNTER — Inpatient Hospital Stay (HOSPITAL_COMMUNITY): Payer: Medicare HMO

## 2019-09-08 ENCOUNTER — Other Ambulatory Visit: Payer: Medicare HMO | Admitting: Orthotics

## 2019-09-08 LAB — GLUCOSE, CAPILLARY
Glucose-Capillary: 211 mg/dL — ABNORMAL HIGH (ref 70–99)
Glucose-Capillary: 208 mg/dL — ABNORMAL HIGH (ref 70–99)
Glucose-Capillary: 187 mg/dL — ABNORMAL HIGH (ref 70–99)
Glucose-Capillary: 192 mg/dL — ABNORMAL HIGH (ref 70–99)
Glucose-Capillary: 166 mg/dL — ABNORMAL HIGH (ref 70–99)
Glucose-Capillary: 135 mg/dL — ABNORMAL HIGH (ref 70–99)
Glucose-Capillary: 111 mg/dL — ABNORMAL HIGH (ref 70–99)

## 2019-09-08 MED ORDER — ETOMIDATE 2 MG/ML IV SOLN: 20.0000 mg | Freq: Once | INTRAVENOUS | Status: AC

## 2019-09-08 MED ORDER — SUCCINYLCHOLINE CHLORIDE 20 MG/ML IJ SOLN: 100.0000 mg | Freq: Once | INTRAMUSCULAR | Status: AC

## 2019-09-08 MED ORDER — ETOMIDATE 2 MG/ML IV SOLN
INTRAVENOUS | Status: AC
  Administered 2019-09-08: 20 mg via INTRAVENOUS
  Filled 2019-09-08: qty 20

## 2019-09-08 MED ORDER — FENTANYL CITRATE (PF) 100 MCG/2ML IJ SOLN
INTRAMUSCULAR | Status: AC
  Administered 2019-09-08: 100 ug via INTRAVENOUS
  Filled 2019-09-08: qty 2

## 2019-09-08 MED ORDER — FOLIC ACID 1 MG PO TABS
1.0000 mg | ORAL_TABLET | Freq: Every day | ORAL | Status: DC
  Administered 2019-09-08: 1 mg via ORAL
  Filled 2019-09-08 (×2): qty 1

## 2019-09-08 MED ORDER — SUCCINYLCHOLINE CHLORIDE 200 MG/10ML IV SOSY
PREFILLED_SYRINGE | INTRAVENOUS | Status: AC
  Administered 2019-09-08: 100 mg via INTRAVENOUS
  Filled 2019-09-08: qty 10

## 2019-09-08 MED ORDER — FENTANYL CITRATE (PF) 100 MCG/2ML IJ SOLN: 100.0000 ug | Freq: Once | INTRAMUSCULAR | Status: AC

## 2019-09-08 MED ORDER — ROCURONIUM BROMIDE 10 MG/ML (PF) SYRINGE
PREFILLED_SYRINGE | INTRAVENOUS | Status: AC
  Filled 2019-09-08: qty 10

## 2019-09-08 MED ORDER — INSULIN ASPART 100 UNIT/ML ~~LOC~~ SOLN
4.0000 [IU] | SUBCUTANEOUS | Status: DC
  Administered 2019-09-08 – 2019-10-03 (×143): 4 [IU] via SUBCUTANEOUS

## 2019-09-08 MED ORDER — MIDAZOLAM HCL 2 MG/2ML IJ SOLN
INTRAMUSCULAR | Status: AC
  Filled 2019-09-08: qty 4

## 2019-09-08 NOTE — Procedures (Signed)
Extubation Procedure Note  Patient Details:   Name: ASLEY BASKERVILLE DOB: 1951/01/18 MRN: [id number]   Airway Documentation:  Airway 7.5 mm (Active)  Secured at (cm) 23 cm 08/20/19 0000   Vent end date: 09/08/19 Vent end time: 1405   Evaluation  O2 sats: fell immediately Complications: Complications of stridor, desaturations, tachypnea Patient did not tolerate procedure well. Bilateral Breath Sounds: Rhonchi, Diminished   Patient was extubated per MD order. Patient met extubation criteria & had a positive cuff leak. Once extubated patient had immediate stridor followed by desaturations, respiratory distress. MD notified & came to reintubate.  Kathie Dike 09/08/2019, 2:30 PM

## 2019-09-08 NOTE — Procedures (Signed)
Intubation Procedure Note  LETINA LUCKETT  [id number]  06-Apr-1950  Date:09/08/19  Time:3:15 PM   Provider Performing:Jahaziel Francois    Procedure: Intubation (31500)  Indication(s) Respiratory Failure  Consent Unable to obtain consent due to emergent nature of procedure.   Anesthesia Etomidate, Fentanyl and Succinylcholine   Time Out Verified patient identification, verified procedure, site/side was marked, verified correct patient position, special equipment/implants available, medications/allergies/relevant history reviewed, required imaging and test results available.   Sterile Technique Usual hand hygeine, masks, and gloves were used   Procedure Description Patient positioned in bed supine.  Sedation given as noted above.  Patient was intubated with endotracheal tube using Glidescope.  View was Grade 1 full glottis .  Number of attempts was 1.  Colorimetric CO2 detector was consistent with tracheal placement.  Small glottis with granulation tissue on left vocal cord.  Complications/Tolerance None; patient tolerated the procedure well. Chest X-ray is ordered to verify placement.   EBL Minimal  Lynnell Catalan, MD [facility] ICU Physician [facility]  Critical Care  Pager: [phone] Mobile: [phone] After hours: [phone].  09/08/2019, 3:17 PM

## 2019-09-08 NOTE — Progress Notes (Addendum)
Palliative: Donna [name] is sitting up in the chair position in the bed.  She is intubated/ventilated.  She is able to follow commands.  Phone conference with sons and siblings including Donna [name], Donna [name], Donna [name], Donna [name].  Present for today's conference is CCM attending Dr. Denese Killings and neuroradiology attending Dr. Corliss Skains.  We talked in particular about Donna [name] respiratory status, ready for extubation.  We talked about some "what if's and maybe's".  At this point Donna [name] would be reintubated if needed, with plans for trach in the future if she needs reintubation.  We talked in detail about being able to clear secretions, cough, swallow safely, nutrition.  We discussed PEG tube for feeding if Donna [name] needs a trach.  Donna [name] asks detailed questions about Donna [name] blood levels, treatment plan related to encourage her natural blood production.  The treatment plan was discussed in detail.  Can you asks about transfer to a "no blood" hospital such as [facility].  We talked medical necessity for transfers, this facility can provide all needs related to [facility] patients.  Donna [name] asks for time for family to discuss options. PMT calls back 30 minutes later, Donna [name] states that he would like for family member to be present when Donna [name] is extubated.  I share that Donna [name] is certainly ready for extubation at this time, and consider that it would be very difficult for her physically and emotionally to wait for extubation when she is clearly ready.  Donna [name] states that he will reach out to his family.  Donna [name] returns call stating he understands the appropriate choice is to extubate Donna [name].  He states that he will be coming to the hospital later this evening.  He is reassured about extubation.  Team updated.  PMT to continue to follow for future goalsetting, to hear Donna [name] voice about what matters to her.  Addendum: Donna [name] was extubated, but quickly  developed stridor.  She needed to be reintubated.  Son Donna [name] updated.  We talked about need for trach/PEG in the future.  Donna [name] reassured that Donna [name] will be cared for in the most up-to-date way respecting her wishes.  He tells me that he will reach out to their family.  Plan:   Ready for extubation.  Reintubate if needed.  Trach/PEG in the future if needed. Updated plan: Continue with intubation, scheduled trach/PEG.  Continue goals of care discussions with family.  70 minutes, extended time. Donna Carmel, NP Palliative medicine team Team phone [phone] greater than 50% of this time was spent counseling and coordinating care related to the above assessment and plan.

## 2019-09-08 NOTE — Progress Notes (Signed)
NAME:  Donna [name], MRN:  [id number], DOB:  04-01-1950, LOS: 14 ADMISSION DATE:  08/25/2019, CONSULTATION DATE:  08/27/2019 REFERRING MD:  Dr. 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  Was placed on ASA/ brillinta.  Monitored in Neuro ICU.  Noted to have developed hypotension, increased left sided weakness and lethargy on 6/15 am with Hgb drop from 13.3 to 6.9 with new AKI.  Post MRI/ MRA showed scattered small acute infarcts along the right MCA/ watershed territory suspected to be resultant either post procedure vs hypoperfusion.  Neurology was consulted with recommendations to keep SBP > 140 for cerebral perfusion.  A CT abd/ pelvis was obtained given concern for retroperitoneal bleed which showed a mild to moderate amount of retroperitoneal hematoma in the right iliac and inguinal regiones with moderate size hematoma noted the the soft tissues anterior to the musculature of the right hip; no definite intrapertioneal hemorrhage. INR, PT, and platelets rechecked and were wnl.  Vascular surgery was consulted with plans to observe given bleeding was mostly into the muscle and tissue.  She was started on neosynephrine to keep SBP goal > 140-160 for cerebral perfusion.  PCCM consulted to help with vasopressor support.  S/p 1L bolus  Repeat Hgb now 6.9-> 7-> 5.1.   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    ETT 6/19 >6/23,  6/23>>  Significant Diagnostic Tests:   6/15 MRA/ MRI brain  >> 1. Scattered small acute infarcts along the right MCA/watershed territory. Solitary small acute infarct in the left parietal cortex and right cerebellum. 2. Improved right ICA patency at the petrous segment. 3. Known severe right M1 segment stenosis. 4. 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  6/15 CT a/p wo contrast >> 1. Mild to moderate amount of retroperitoneal hematoma is noted in the right iliac and inguinal regions. Moderate size hematoma is noted in the soft tissues anterior to the musculature of the right hip. No definite intraperitoneal hemorrhage is noted. 2. Moderate size fat containing periumbilical hernia. Aortic Atherosclerosis   Echo 6/16 >> normal LVEF  Micro Data:  6/10 SARS 2 >> neg 6/14 MRSA PCR >> neg 6/20 trach asp >> few diptheroids/ corynebacterium species 6/18 BC x 2 >> neg   Antimicrobials:  6/14 cefazolin pre-op  6/20 unasyn >> 09/02/2019  Interim history/subjective:  S/p family meeting 6/27; additional meeting scheduled today at 1230 No events overnight Remains on precedex  Objective   Blood pressure (!) 132/45, pulse 85, temperature 99 F (37.2 C), temperature source Axillary, resp. rate 16, height 5\' 1"  (1.549 m), weight 80.5 kg, SpO2 100 %.    Vent Mode: PRVC FiO2 (%):  [40 %] 40 % Set Rate:  [  18 bmp] 18 bmp Vt Set:  [410 mL] 410 mL PEEP:  [5 cmH20] 5 cmH20 Pressure Support:  [5 cmH20] 5 cmH20 Plateau Pressure:  [16 cmH20-21 cmH20] 21 cmH20   Intake/Output Summary (Last 24 hours) at 09/08/2019 0731 Last data filed at 09/08/2019  0600 Gross per 24 hour  Intake 1667.47 ml  Output 1050 ml  Net 617.47 ml   Filed Weights   09/06/19 0500 09/07/19 0500 09/08/19 0500  Weight: 78 kg 80 kg 80.5 kg    Examination: General:  Elderly female in NAD intubated on mechanical ventilation HEENT: MM pale/moist, ETT, R nare cortrak, pupils 3/reactive Neuro: Awakes to voice, follows simple commands - moves RUE/ RLE, flaccid on left,  CV: rr, NSR, no murmur PULM:  On PSV 5/5 doing well, diffuse rhonchi clears with suctioning, mild yellowish secretions, poor cough GI: obese, soft, bs active, NT, purwick catheter Extremities: warm/dry, no LE edema, mild dependent UE edema Skin: no rashes  Resolved Hospital Problem list   Hypotension  AKI  Assessment & Plan:   s/p RT ICA cer/petrous junction balloon angioplasty for severe stenosis. Post-procedure with residual stenosis of approx 25 %. Severe stenosis of RT MCA M1 seg with improved flow post angioplasty Right MCA watershed territory infarct Small left frontal cortex and right cerebellum  P:  - Neuro IR Primary   Acute respiratory failure with hypoxia - -CXR with pulm edema, mild BUL opacities  -Mild upper airway pseudowheeze 6/18, diuresed and given Solu-Medrol for stridor -Reintubated for acute hypercarbic respiratory failure 6/20 with bilateral infiltrates?  Aspiration versus edema , favor aspiration given leukocytosis P: - full MV support, ongoing palliative care discussions.  Weaning well today, tolerated 12 hours of 5/5 yesterday; mental status has been a barrier to extubation.  She is following commands today, will discuss with attending, but likely to trial extubation attempt once GOCs decided, if fails, will need prolonged support, tracheostomy, and likely peg tube placement  Acute metabolic encephalopathy - -in setting of CNS depressing medications, R MCA CVA P: - remains on minimal precedex for comfort   Acute blood loss anemia - Jehovah's Witness: no blood  products including albumin - CTA Abd/Pelvis 08/26/19 with hematoma of the right groin and retroperitoneum with expansion to the right rectus femoris expansion. No active bleeding S/p -  feraheme x2 doses ( 6/19 ) -interval drop in H/H -- no s/sx ongoing bleeding, likely component of dilution as well as anemia of critical illness  P: - continue supportive care - aranesp weekly (last titrated 6/23)  - minimize blood draws, CBC q 48 hr  DM, type II - uncontrolled P:  lantus plus SSI moderate Add TF coverage today  Hypokalemia, improved Hypernatremia, resolved  P: - trend on BMP, replete as necessary   Nutrition P:  - TF at goal via cortrak   Debilitation  P:  - PT/OT if and when able   Best practice:  Diet: EN per RDN  Pain/Anxiety/Delirium protocol (if indicated): precedex, prn fentanyl (not needed) VAP protocol (if indicated): yes  DVT prophylaxis: SCDs, lovenox GI prophylaxis: Pepcid Glucose control: CBG q 4 Mobility: PT/OT  Code Status: Full  Family Communication: son, [name] at [phone], pending for 6/28, family meeting with PMT, NIR, and CCM scheduled today at 81 Disposition: ICU  CCT: 30 mins  Kennieth Rad, MSN, AGACNP-BC [facility] 09/08/2019, 7:31 AM  See Shea Evans for personal pager PCCM on call pager [phone]

## 2019-09-08 NOTE — Progress Notes (Signed)
  Referring Physician(s): Ahern, Antonia B  Supervising Physician: [name], Sanjeev  Patient Status:  [facility] - In-pt  Chief Complaint: None- intubated with sedation  Subjective:  Right ICA cervical/petrous junction stenosis s/p revascularization using balloon angioplasty via right radial and right femoral approach6/14/2021by Dr. [name]. Patientintubated withsedation.Sheopens eyes to voice and follows simple commands. Can spontaneously move right sidewith left-sided neglect. Right groin and right radial puncture sites c/d/i. Most recent hgbyesterday was 4.5 (down from 4.8 09/03/2019). No family at bedside this AM.   Allergies: Patient has no known allergies.  Medications: Prior to Admission medications   Medication Sig Start Date End Date Taking? Authorizing Provider  amLODipine (NORVASC) 10 MG tablet Take 1 tablet (10 mg total) by mouth daily. 05/12/19  Yes Moore, [name], FNP  aspirin EC 81 MG tablet Take 81 mg by mouth every evening.   Yes [provider]  Calcium Carbonate-Vitamin D (CALCIUM-D PO) Take 2 tablets by mouth daily at 12 noon.    Yes [provider]  carvedilol (COREG) 6.25 MG tablet TAKE 1 TABLET(6.25 MG) BY MOUTH TWICE DAILY Patient taking differently: Take 6.25 mg by mouth 2 (two) times daily with a meal.  07/31/19  Yes Moore, [name], FNP  hydrALAZINE (APRESOLINE) 50 MG tablet Take 1 tablet (50 mg total) by mouth 2 (two) times daily. 06/16/19 09/14/19 Yes Acharya, Gayatri A, MD  Insulin Degludec-Liraglutide (XULTOPHY) 100-3.6 UNIT-MG/ML SOPN Inject 30 Units into the skin daily. 08/14/19  Yes Moore, [name], FNP  telmisartan-hydrochlorothiazide (MICARDIS HCT) 40-12.5 MG tablet Take 1 tablet by mouth daily. 07/31/19  Yes Moore, [name], FNP  ticagrelor (BRILINTA) 90 MG TABS tablet Take 1 tablet (90 mg total) by mouth 2 (two) times daily. 06/12/19  Yes Acharya, Gayatri A, MD  vitamin C (ASCORBIC ACID) 250 MG tablet Take 500 mg by mouth daily at 12 noon.    Yes [provider]  blood glucose meter kit and supplies KIT Dispense based on patient and insurance preference. Use up to four times daily as directed. (FOR ICD-9 250.00, 250.01). 05/10/18   Moore, [name], FNP  Blood Glucose Monitoring Suppl (TRUE METRIX METER) w/Device KIT 1 each by Does not apply route in the morning, at noon, in the evening, and at bedtime. 05/22/19   Moore, [name], FNP  Evolocumab (REPATHA SURECLICK) 140 MG/ML SOAJ Inject 140 mg into the skin every 14 (fourteen) days. 06/30/19   Acharya, Gayatri A, MD  glucose blood (TRUE METRIX BLOOD GLUCOSE TEST) test strip Check blood sugar 4 times a day before meals and bedtime 05/22/19   Moore, [name], FNP  Insulin Glargine-Lixisenatide (SOLIQUA) 100-33 UNT-MCG/ML SOPN Inject 25 Units into the skin daily. Patient taking differently: Inject 30 Units into the skin daily.  07/14/19   Moore, [name], FNP  Multiple Vitamin (MULTIVITAMIN PO) Take 1 tablet by mouth daily at 12 noon.     [provider]  Omega-3 Fatty Acids (OMEGA-3 PLUS PO) Take 2 capsules by mouth daily at 12 noon.    [provider]     Vital Signs: BP 138/61 (BP Location: Left Arm)   Pulse 87   Temp 98.4 F (36.9 C) (Axillary)   Resp 18   Ht 5' 1" (1.549 m)   Wt 177 lb 7.5 oz (80.5 kg)   SpO2 100%   BMI 33.53 kg/m   Physical Exam Vitals and nursing note reviewed.  Constitutional:      General: She is not in acute distress.    Comments: Intubated with sedation.    Pulmonary:     Effort: Pulmonary effort is normal. No respiratory distress.     Comments: Intubated with sedation. Skin:    General: Skin is warm and dry.     Comments: Right groin puncture site soft without active bleeding or hematoma. Right radial puncture site soft without active bleeding or hematoma.   Neurological:     Comments:  Intubated with sedation. She opens eyes to voice and follows simple commands. PERRL bilaterally. Can spontaneously move right side but no  spontaneous movements of left side (left-sided neglect). Distal pulses (DPs) 1+ bilaterally and (radial) 2+ bilaterally.      Imaging: No results found.  Labs:  CBC: Recent Labs    08/31/19 0216 08/31/19 0216 09/03/19 0834 09/03/19 2106 09/05/19 0618 09/07/19 0512  WBC 18.4*  --  9.1  --  8.0 8.4  HGB 5.0*   < > 5.1* 5.1* 4.8* 4.5*  HCT 16.8*   < > 18.5* 15.0* 17.7* 15.7*  PLT 246  --  229  --  184 175   < > = values in this interval not displayed.    COAGS: Recent Labs    11/27/18 0728 08/18/19 0705 08/26/19 1715 08/27/19 0230  INR 0.9 0.9 1.1 1.2    BMP: Recent Labs    08/31/19 0216 08/31/19 0216 09/03/19 0834 09/03/19 0834 09/03/19 1633 09/03/19 2106 09/05/19 0618 09/07/19 0512  NA 148*   < > 162*   < > 158* 159* 150* 144  K 4.5   < > 3.6  --   --  3.4* 3.6 3.4*  CL 113*  --  117*  --   --   --  110 108  CO2 28  --  35*  --   --   --  31 28  GLUCOSE 326*  --  111*  --   --   --  290* 148*  BUN 33*  --  23  --   --   --  27* 23  CALCIUM 8.5*  --  9.2  --   --   --  8.6* 8.1*  CREATININE 0.93  --  0.90  --   --   --  0.75 0.76  GFRNONAA >60  --  >60  --   --   --  >60 >60  GFRAA >60  --  >60  --   --   --  >60 >60   < > = values in this interval not displayed.    LIVER FUNCTION TESTS: Recent Labs    08/29/19 0457 09/03/19 0834 09/05/19 0618 09/07/19 0512  BILITOT 0.8 0.8 1.0 0.8  AST _0 34  ALT _1 ALKPHOS 42 58 56 46  PROT 5.5* 6.3* 5.8* 5.5*  ALBUMIN 2.4* 2.2* 2.0* 1.8*    Assessment and Plan:  Right ICA cervical/petrous junction stenosis s/p revascularization using balloon angioplasty via right radial and right femoral approach6/14/2021by Dr. Estanislado Pandy. Mild-moderate right thigh (with some retroperitoneal component) hematoma. Patient's condition stable- remainsintubated with sedation(weaning),opens eyes to voice and follows simple commands, has spontaneous movements of right side but no spontaneous movements of  left side. Right groinand right radial puncture sitesstable, no signs of active bleeding. For family meeting today to discuss [place]. Continue to hold Brilinta/Aspirin.Agree with CCM plans to trent labs QOD to avoid excess blood drawl. Further plans per CCM/neurology/palliative care- appreciate and agree with management. NIR to follow.   Electronically Signed: Earley Abide, PA-C 09/08/2019, 9:12 AM  I spent a total of 25 Minutes at the the patient's bedside AND on the patient's hospital floor or unit, greater than 50% of which was counseling/coordinating care for right ICA stenosis s/p revascularization.

## 2019-09-08 NOTE — Chronic Care Management (AMB) (Signed)
Chronic Care Management   Follow Up Note   09/08/2019 Name: Donna [name] MRN: [id number] DOB: [birth year]  Referred by: Donna Brine, FNP Reason for referral : Chronic Care Management (FU RN CM Call - post d/c follow up )   Donna [name] is a 69 y.o. year old female who is a primary care patient of Donna [name], [place]. The CCM team was consulted for assistance with chronic disease management and care coordination needs.    Review of patient status, including review of consultants reports, relevant laboratory and other test results, and collaboration with appropriate care team members and the patient's provider was performed as part of comprehensive patient evaluation and provision of chronic care management services.    SDOH (Social Determinants of Health) assessments performed: No See Care Plan activities for detailed interventions related to [facility])   Patient remains to be IP at [facility].     Facility-Administered Encounter Medications as of 09/05/2019  Medication Note  . 0.9 %  sodium chloride infusion   . acetaminophen (TYLENOL) tablet 650 mg    Or  . acetaminophen (TYLENOL) 160 MG/5ML solution 650 mg    Or  . acetaminophen (TYLENOL) suppository 650 mg   . albuterol (PROVENTIL) (2.5 MG/3ML) 0.083% nebulizer solution 2.5 mg   . chlorhexidine gluconate (MEDLINE KIT) (PERIDEX) 0.12 % solution 15 mL   . Chlorhexidine Gluconate Cloth 2 % PADS 6 each   . Darbepoetin Alfa (ARANESP) injection 100 mcg   . dexmedetomidine (PRECEDEX) 400 MCG/100ML (4 mcg/mL) infusion   . docusate (COLACE) 50 MG/5ML liquid 100 mg   . [COMPLETED] etomidate (AMIDATE) injection 20 mg   . famotidine (PEPCID) tablet 20 mg   . feeding supplement (PRO-STAT SUGAR FREE 64) liquid 30 mL   . feeding supplement (VITAL AF 1.2 CAL) liquid 1,000 mL   . [COMPLETED] fentaNYL (SUBLIMAZE) injection 100 mcg   . fentaNYL (SUBLIMAZE) injection 25-100 mcg   . folic acid (FOLVITE) tablet 1 mg   . free  water 30 mL   . insulin aspart (novoLOG) injection 0-15 Units   . insulin aspart (novoLOG) injection 4 Units   . insulin glargine (LANTUS) injection 20 Units   . ipratropium-albuterol (DUONEB) 0.5-2.5 (3) MG/3ML nebulizer solution 3 mL   . labetalol (NORMODYNE) injection 10 mg   . MEDLINE mouth rinse   . [COMPLETED] potassium chloride (KLOR-CON) packet 40 mEq   . sodium chloride flush (NS) 0.9 % injection 10-40 mL   . sodium chloride flush (NS) 0.9 % injection 10-40 mL   . [COMPLETED] succinylcholine (ANECTINE) injection 100 mg   . [DISCONTINUED] dextrose 10 % infusion   . [DISCONTINUED] enoxaparin (LOVENOX) injection 40 mg 09/08/2019: Per Dr. Estanislado Pandy  . [DISCONTINUED] free water 300 mL   . [DISCONTINUED] insulin aspart (novoLOG) injection 11 Units   . [DISCONTINUED] midazolam (VERSED) 2 MG/2ML injection   . [DISCONTINUED] rocuronium bromide 100 MG/10ML SOSY    Outpatient Encounter Medications as of 09/05/2019  Medication Sig Note  . amLODipine (NORVASC) 10 MG tablet Take 1 tablet (10 mg total) by mouth daily.   Marland Kitchen aspirin EC 81 MG tablet Take 81 mg by mouth every evening.   . blood glucose meter kit and supplies KIT Dispense based on patient and insurance preference. Use up to four times daily as directed. (FOR ICD-9 250.00, 250.01). 06/13/2018: Bought Reli[employer] meter from [employer]    . Blood Glucose Monitoring Suppl (TRUE METRIX METER) w/Device KIT 1 each by Does not apply route  in the morning, at noon, in the evening, and at bedtime.   . Calcium Carbonate-Vitamin D (CALCIUM-D PO) Take 2 tablets by mouth daily at 12 noon.    . carvedilol (COREG) 6.25 MG tablet TAKE 1 TABLET(6.25 MG) BY MOUTH TWICE DAILY (Patient taking differently: Take 6.25 mg by mouth 2 (two) times daily with a meal. )   . Evolocumab (REPATHA SURECLICK) 366 MG/ML SOAJ Inject 140 mg into the skin every 14 (fourteen) days. 08/06/2019: Pt has not started med   . glucose blood (TRUE METRIX BLOOD GLUCOSE TEST) test strip Check  blood sugar 4 times a day before meals and bedtime   . hydrALAZINE (APRESOLINE) 50 MG tablet Take 1 tablet (50 mg total) by mouth 2 (two) times daily.   . Insulin Degludec-Liraglutide (XULTOPHY) 100-3.6 UNIT-MG/ML SOPN Inject 30 Units into the skin daily.   . Insulin Glargine-Lixisenatide (SOLIQUA) 100-33 UNT-MCG/ML SOPN Inject 25 Units into the skin daily. (Patient taking differently: Inject 30 Units into the skin daily. )   . Multiple Vitamin (MULTIVITAMIN PO) Take 1 tablet by mouth daily at 12 noon.    . Omega-3 Fatty Acids (OMEGA-3 PLUS PO) Take 2 capsules by mouth daily at 12 noon.   Marland Kitchen telmisartan-hydrochlorothiazide (MICARDIS HCT) 40-12.5 MG tablet Take 1 tablet by mouth daily.   . ticagrelor (BRILINTA) 90 MG TABS tablet Take 1 tablet (90 mg total) by mouth 2 (two) times daily.   . vitamin C (ASCORBIC ACID) 250 MG tablet Take 500 mg by mouth daily at 12 noon.      Objective:  Lab Results  Component Value Date   HGBA1C 11.2 (H) 05/22/2019   HGBA1C 11.3 (H) 11/27/2018   HGBA1C 12.3 (H) 10/07/2018   Lab Results  Component Value Date   MICROALBUR 80 01/08/2019   LDLCALC 132 (H) 05/22/2019   CREATININE 0.76 09/07/2019   BP Readings from Last 3 Encounters:  09/08/19 119/88  08/18/19 (!) 120/56  08/18/19 (!) 150/70    Plan:   The CCM team will continue to monitor for IP status/discharge.   Barb Merino, RN, BSN, CCM Care Management Coordinator [facility]  Direct Phone: [phone]

## 2019-09-09 ENCOUNTER — Inpatient Hospital Stay (HOSPITAL_COMMUNITY): Payer: Medicare HMO

## 2019-09-09 LAB — COMPREHENSIVE METABOLIC PANEL
ALT: 29 U/L (ref 0–44)
AST: 28 U/L (ref 15–41)
Albumin: 1.7 g/dL — ABNORMAL LOW (ref 3.5–5.0)
Alkaline Phosphatase: 49 U/L (ref 38–126)
Anion gap: 7 (ref 5–15)
BUN: 24 mg/dL — ABNORMAL HIGH (ref 8–23)
CO2: 29 mmol/L (ref 22–32)
Calcium: 8.4 mg/dL — ABNORMAL LOW (ref 8.9–10.3)
Chloride: 110 mmol/L (ref 98–111)
Creatinine, Ser: 0.76 mg/dL (ref 0.44–1.00)
GFR calc Af Amer: >60 mL/min (ref >60)
GFR calc non Af Amer: >60 mL/min (ref >60)
Glucose, Bld: 170 mg/dL — ABNORMAL HIGH (ref 70–99)
Potassium: 3.8 mmol/L (ref 3.5–5.1)
Sodium: 146 mmol/L — ABNORMAL HIGH (ref 135–145)
Total Bilirubin: 0.4 mg/dL (ref 0.3–1.2)
Total Protein: 5.8 g/dL — ABNORMAL LOW (ref 6.5–8.1)

## 2019-09-09 LAB — GLUCOSE, CAPILLARY
Glucose-Capillary: 151 mg/dL — ABNORMAL HIGH (ref 70–99)
Glucose-Capillary: 121 mg/dL — ABNORMAL HIGH (ref 70–99)
Glucose-Capillary: 142 mg/dL — ABNORMAL HIGH (ref 70–99)
Glucose-Capillary: 112 mg/dL — ABNORMAL HIGH (ref 70–99)
Glucose-Capillary: 79 mg/dL (ref 70–99)
Glucose-Capillary: 125 mg/dL — ABNORMAL HIGH (ref 70–99)

## 2019-09-09 LAB — CBC
HCT: 15.6 % — ABNORMAL LOW (ref 36.0–46.0)
Hemoglobin: 4.3 g/dL — CL (ref 12.0–15.0)
MCH: 30.3 pg (ref 26.0–34.0)
MCHC: 27.6 g/dL — ABNORMAL LOW (ref 30.0–36.0)
MCV: 109.9 fL — ABNORMAL HIGH (ref 80.0–100.0)
Platelets: 239 10*3/uL (ref 150–400)
RBC: 1.42 MIL/uL — ABNORMAL LOW (ref 3.87–5.11)
RDW: 20.3 % — ABNORMAL HIGH (ref 11.5–15.5)
WBC: 6.5 10*3/uL (ref 4.0–10.5)
nRBC: 2.6 % — ABNORMAL HIGH (ref 0.0–0.2)

## 2019-09-09 MED ORDER — FOLIC ACID 1 MG PO TABS
1.0000 mg | ORAL_TABLET | Freq: Every day | ORAL | Status: DC
  Administered 2019-09-09 – 2019-09-24 (×16): 1 mg
  Filled 2019-09-09 (×17): qty 1

## 2019-09-09 MED ORDER — VITAMIN C 500 MG/5ML PO SYRP
500.0000 mg | ORAL_SOLUTION | Freq: Three times a day (TID) | ORAL | Status: AC
  Administered 2019-09-09 – 2019-09-11 (×9): 500 mg
  Filled 2019-09-09 (×10): qty 5

## 2019-09-09 MED ORDER — DARBEPOETIN ALFA 60 MCG/0.3ML IJ SOSY
60.0000 ug | PREFILLED_SYRINGE | Freq: Every day | INTRAMUSCULAR | Status: AC
  Administered 2019-09-09 – 2019-09-18 (×10): 60 ug via SUBCUTANEOUS
  Filled 2019-09-09 (×17): qty 0.3

## 2019-09-09 MED ORDER — VITAMIN B-12 1000 MCG PO TABS
1000.0000 ug | ORAL_TABLET | Freq: Every day | ORAL | Status: DC
  Filled 2019-09-09 (×2): qty 1

## 2019-09-09 MED ORDER — SODIUM CHLORIDE 0.9 % IV SOLN
125.0000 mg | Freq: Every day | INTRAVENOUS | Status: AC
  Administered 2019-09-09 – 2019-09-18 (×10): 125 mg via INTRAVENOUS
  Filled 2019-09-09 (×10): qty 10

## 2019-09-09 MED ORDER — VITAMIN C 500 MG/5ML PO SYRP
500.0000 mg | ORAL_SOLUTION | Freq: Three times a day (TID) | ORAL | Status: DC
  Filled 2019-09-09: qty 5

## 2019-09-09 MED ORDER — VITAMIN C 500 MG/5ML PO SYRP: 500.0000 mg | ORAL_SOLUTION | Freq: Three times a day (TID) | ORAL | Status: DC

## 2019-09-09 MED ORDER — FOLIC ACID 1 MG PO TABS: 1.0000 mg | ORAL_TABLET | Freq: Every day | ORAL | Status: DC

## 2019-09-09 NOTE — Progress Notes (Signed)
Occupational Therapy Treatment Patient Details Name: Donna [name] MRN: [id number] DOB: 14-Apr-1950 Today's Date: 09/09/2019    History of present illness 69 y.o. female with PMH significant for HTN, HLD, CVA 06/2018, DM, anemia, obesity, OSA, and OA. She is [facility] to [facility] and has been followed by Dr. Estanislado Pandy since 10/2018. She first presented to our department at the request of Dr. Jaynee Eagles for management of intracranial stenosis, thought to be cause of recent CVA at that time. Pt underwent R ICA angioplasty on 6/14. Pt with decline in neuro status and hemoglobin on 6/15, found to have a large R groin hematoma.  Pt has had Hbg 5.0 or less and is a Jehovah's witness.  She was intubated on 6/19   OT comments  Pt appears to be tolerating splint wear without evidence of pressure.  PROM of Lt UE WFL.  Pt remains intubated and sedated, and not able to actively participate at this time.  OT will sign off at this time.  Please reorder once pt stable enough to participate.   Follow Up Recommendations  Other (comment) (LTACH vs. SNF depending on progress )    Equipment Recommendations  None recommended by OT    Recommendations for Other Services      Precautions / Restrictions Precautions Precautions: Fall Precaution Comments: ETT       Mobility Bed Mobility               General bed mobility comments: did not attempt   Transfers                      Balance                                           ADL either performed or assessed with clinical judgement   ADL                                         General ADL Comments: pt unable to assist with any ADLs      Vision       Perception     Praxis      Cognition Arousal/Alertness: Lethargic Behavior During Therapy: Flat affect                                   General Comments: Pt will look to midline with max cues/stimulation.  She followed no commands today          Exercises General Exercises - Upper Extremity Shoulder Flexion: PROM;Left;10 reps;Supine Shoulder Extension: PROM;Left;10 reps;Supine Shoulder ABduction: PROM;Left;10 reps;Supine Shoulder ADduction: PROM;Left;10 reps;Supine Shoulder Horizontal ABduction: PROM;Left;10 reps;Supine Shoulder Horizontal ADduction: PROM;Left;10 reps;Supine Elbow Flexion: PROM;Left;10 reps;Supine Elbow Extension: PROM;Left;10 reps;Supine Wrist Flexion: PROM;Left;10 reps;Supine Wrist Extension: PROM;Left;10 reps;Supine Digit Composite Flexion: PROM;Left;10 reps;Supine Composite Extension: PROM;Left;10 reps;Supine Other Exercises Other Exercises: RN reports splints seemingly have been fitting well    Shoulder Instructions       General Comments      Pertinent Vitals/ Pain       Pain Assessment: Faces Faces Pain Scale: No hurt  Home Living  Prior Functioning/Environment              Frequency           Progress Toward Goals  OT Goals(current goals can now be found in the care plan section)  Progress towards OT goals: Goals met/education completed, patient discharged from [facility] All goals met and education completed, patient discharged from OT services    Co-evaluation                 AM-PAC OT "6 Clicks" Daily Activity     Outcome Measure   Help from another person eating meals?: Total Help from another person taking care of personal grooming?: Total Help from another person toileting, which includes using toliet, bedpan, or urinal?: Total Help from another person bathing (including washing, rinsing, drying)?: Total Help from another person to put on and taking off regular upper body clothing?: Total Help from another person to put on and taking off regular lower body clothing?: Total 6 Click Score: 6    End of Session Equipment Utilized During Treatment: Oxygen  OT Visit Diagnosis: Muscle weakness  (generalized) (M62.81);Cognitive communication deficit (R41.841);Hemiplegia and hemiparesis Symptoms and signs involving cognitive functions: Cerebral infarction Hemiplegia - Right/Left: Left Hemiplegia - dominant/non-dominant: Non-Dominant Hemiplegia - caused by: Cerebral infarction   Activity Tolerance Patient limited by lethargy   Patient Left in bed;with call bell/phone within reach;with nursing/sitter in room   Nurse Communication          Time: 2500-3704 OT Time Calculation (min): 8 min  Charges: OT General Charges $OT Visit: 1 Visit OT Treatments $Therapeutic Activity: 8-22 mins  Nilsa Nutting., OTR/L Acute Rehabilitation Services Pager [phone] Office [phone]    Lucille Passy M 09/09/2019, 12:55 PM

## 2019-09-09 NOTE — Progress Notes (Signed)
Patient was taken to MRI & back on the ventilator with no problems.

## 2019-09-09 NOTE — Progress Notes (Signed)
Palliative: Donna [name] is lying quietly in bed.  She is intubated/ventilated, sedated.  She will open her eyes and briefly make but not keep eye contact.  She appears acutely/chronically ill and frail.  There is no family at bedside at this time.  Call to son, Donna [name].  Reassured son Donna [name] that Donna [name] is receiving a Jehovah's Witness type protocol for those with low hemoglobin who declined to take blood products.  We review selected labs.  We talked in detail about retroperitoneal bleed, that this is not GI bleeding.  We talk about the treatment plan, including but not limited to [facility] Witness protocol for increasing blood, swelling/damage to vocal cords, medication regimen.   We talked about need for trach in the future, and also PEG.  We talked about time for outcomes, and building up blood.    Conference with attending, CCM, bedside nursing staff related to patient condition, needs, goals of care.    PMT provider will continue to follow Donna [name], to be seen Friday, July 2 unless there is imminent need.  Plan:  Continue to treat the treatable.  Trach/PEG in future.  We did not talk about CODE STATUS today.      45 minutes  Lillia Carmel, NP Palliative medicine team Team phone [phone] Greater than 50% of this time was spent counseling and coordinating care related to the above assessment and plan.

## 2019-09-09 NOTE — Progress Notes (Signed)
NAME:  Donna [name], MRN:  [id number], DOB:  04/08/1950, LOS: 15 ADMISSION DATE:  08/25/2019, CONSULTATION DATE:  08/27/2019 REFERRING MD:  Dr. 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  Was placed on ASA/ brillinta.  Monitored in Neuro ICU.  Noted to have developed hypotension, increased left sided weakness and lethargy on 6/15 am with Hgb drop from 13.3 to 6.9 with new AKI.  Post MRI/ MRA showed scattered small acute infarcts along the right MCA/ watershed territory suspected to be resultant either post procedure vs hypoperfusion.  Neurology was consulted with recommendations to keep SBP > 140 for cerebral perfusion.  A CT abd/ pelvis was obtained given concern for retroperitoneal bleed which showed a mild to moderate amount of retroperitoneal hematoma in the right iliac and inguinal regiones with moderate size hematoma noted the the soft tissues anterior to the musculature of the right hip; no definite intrapertioneal hemorrhage. INR, PT, and platelets rechecked and were wnl.  Vascular surgery was consulted with plans to observe given bleeding was mostly into the muscle and tissue.  She was started on neosynephrine to keep SBP goal > 140-160 for cerebral perfusion.  PCCM consulted to help with vasopressor support.  S/p 1L bolus  Repeat Hgb now 6.9-> 7-> 5.1.   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    ETT 6/19 >6/23,  6/23>>  Significant Diagnostic Tests:   6/15 MRA/ MRI brain  >> 1. Scattered small acute infarcts along the right MCA/watershed territory. Solitary small acute infarct in the left parietal cortex and right cerebellum. 2. Improved right ICA patency at the petrous segment. 3. Known severe right M1 segment stenosis. 4. 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  6/15 CT a/p wo contrast >> 1. Mild to moderate amount of retroperitoneal hematoma is noted in the right iliac and inguinal regions. Moderate size hematoma is noted in the soft tissues anterior to the musculature of the right hip. No definite intraperitoneal hemorrhage is noted. 2. Moderate size fat containing periumbilical hernia. Aortic Atherosclerosis   Echo 6/16 >> normal LVEF  Micro Data:  6/10 SARS 2 >> neg 6/14 MRSA PCR >> neg 6/20 trach asp >> few diptheroids/ corynebacterium species 6/18 BC x 2 >> neg   Antimicrobials:  6/14 cefazolin pre-op  6/20 unasyn >> 09/02/2019  Interim history/subjective:  Failed attempt of extubation yesterday due to upper airway stridor.  Objective   Blood pressure (!) 126/53, pulse 74, temperature 98.9 F (37.2 C), temperature source Axillary, resp. rate 18, height 5\' 1"  (1.549 m), weight 80.5 kg, SpO2 100 %.    Vent Mode: CPAP;PSV FiO2 (%):  [40 %] 40 % Set Rate:  [18 bmp] 18 bmp Vt  Set:  [410 mL] 410 mL PEEP:  [5 cmH20] 5 cmH20 Pressure Support:  [10 cmH20] 10 cmH20 Plateau Pressure:  [18 cmH20-24 cmH20] 24 cmH20   Intake/Output Summary (Last 24 hours) at 09/09/2019 1129 Last data filed at 09/09/2019 0700 Gross per 24 hour  Intake 1204.33  ml  Output 690 ml  Net 514.33 ml   Filed Weights   09/06/19 0500 09/07/19 0500 09/08/19 0500  Weight: 78 kg 80 kg 80.5 kg    Examination: General:  Elderly female in NAD intubated on mechanical ventilation HEENT: MM pale/moist, ETT, R nare cortrak, pupils 3/reactive Neuro: Awakes to voice, follows simple commands - moves RUE/ RLE, flaccid on left,  CV: rr, NSR, no murmur PULM:  On PSV 5/5 doing well, diffuse rhonchi clears with suctioning, mild yellowish secretions, poor cough GI: obese, soft, bs active, NT, purwick catheter Extremities: warm/dry, no LE edema, mild dependent UE edema Skin: no rashes  Resolved Hospital Problem list   Hypotension  AKI  Assessment & Plan:   Right MCA CVA due to bilateral intracranial stenoses s/p RT ICA cer/petrous junction balloon angioplasty for severe stenosis. Persistent left-sided weakness. -Continue secondary stroke prevention. -Avoiding antiplatelet agents given high risk of bleeding.  Critically ill due to acute respiratory failure with hypoxia requiring mechanical ventilation.  Failed trial of extubation due to upper airway stridor.  Cords visibly scarred on reintubation. -Continue daily SBT's. -Patient needs tracheostomy.   Acute blood loss anemia - Jehovah's Witness: no blood products including albumin -Started on severe anemia protocol of iron, vitamin C, vitamin B12 folic acid and erythropoietin.  Daily Goals Checklist  Pain/Anxiety/Delirium protocol (if indicated): Precedex Neuro vitals: every 4 hours hours AED's: None VAP protocol (if indicated): Bundle in place Respiratory support goals: Daily SBT Blood pressure target: Keep systolic blood pressure less than 160 DVT prophylaxis: Mechanical DVT prophylaxis only given anemia and high bleeding risk. Nutrition Status: Tolerating tube feeds. GI prophylaxis: Pantoprazole Fluid status goals: Allow autoregulation Urinary catheter: External catheter Central lines: PICC  line Glucose control: Adequate glycemic control, type 2 diabetes. Mobility/therapy needs: PT OT Antibiotic de-escalation: No antibiotics Home medication reconciliation: On hold Daily labs: CBC daily. Code Status: Full code. Family Communication: Family meeting yesterday.  Goal to proceed with tracheostomy and PEG tube. Disposition: ICU.  CRITICAL CARE Performed by: Lynnell Catalan   Total critical care time: 35 minutes  Critical care time was exclusive of separately billable procedures and treating other patients.  Critical care was necessary to treat or prevent imminent or life-threatening deterioration.  Critical care was time spent personally by me on the following activities: development of treatment plan with patient and/or surrogate as well as nursing, discussions with consultants, evaluation of patient's response to treatment, examination of patient, obtaining history from patient or surrogate, ordering and performing treatments and interventions, ordering and review of laboratory studies, ordering and review of radiographic studies, pulse oximetry, re-evaluation of patient's condition and participation in multidisciplinary rounds.  Lynnell Catalan, MD [facility] ICU Physician [facility]  Pager: [phone] Mobile: [phone] After hours: [phone].

## 2019-09-09 NOTE — Progress Notes (Addendum)
This RN spoke to patient's son, Donna [name], and "family-friend" together on the phone at this time regarding patient's status and goals of care. RN received permission from son, Donna [name], to also speak to family friend. Both son and family-friend inquired about the patient and what is going on with her at this time. RN explained to them that patient still has a hgb of 4.3 despite giving vitamin C supplements, B12 supplements, iron, and Aranesp. Family asked why that was the case and why her hgb was not increasing. This RN explained that the patient has a critically low hgb and that supplementing with iron and vitamins will not be a quick fix and will not increase her hgb in a few days. RN explained that in situations like this we would normally replace RBC but that her treatment team at [facility] understands and respects that that is not an option due to patient's religious beliefs (Jehovah's Witness). Family also did not know that patient had a history of anemia. Family told RN "She does not have anemia, her last hgb at the doctor's was 64." RN told family that this is what is in patient's chart with [facility] and that RN does not have control what was in the chart prior to patient's admission to [facility] ICU. Family had concerns with "miscommunication between doctors" specifically regarding patient's hgb being "stable", that they were told patient did not have an active bleed, and that the provider's were comfortable with putting a trach in the patient with a low hgb. RN explained to family that RN does not know what was said in the family conversation with the provider because RN was not present for it, but that the patient does have a retroperitoneal bleed, the patient's hgb is critically low and putting in trach is a huge bleeding risk, and that the provider would not put a trach in the patient unless consent was given from family and risks/benefits were thoroughly discussed with them by the physican him/herself. Family  told RN "I thought we discussed during the family meeting on Monday that we were going to put a trach in". RN said there was no consent form signed for the trach in the patient's chart and the physicians would have to explain risk/benefits to the family before they could consent to a procedure, especially with patient's hgb being so low. Family was then concerned that one of the physicians said " that the patient was stable and they were comfortable putting trach in with patient's current hgb". RN explained to family that she does not know what the physician said but the physician likely mean't that the hgb was stable, meaning, not trending upward or downward in the last couple of days. Son then said "none of this was discussed in the family meeting so we are very confused". RN apologized for the confusion and offered a solution to where son, Donna [name], comes in tomorrow during visiting hours to speak to the CCM doctor in person to further avoid any confusion. Son said he would do that when he gets off work, sometime in the afternoon/evening. Family then asked about patient's airway. RN explained that because patient had to be emergently reintubated on Monday after attempting extubation, her vocal cords are injured and that next steps would need to be discussed with attending physician about what family would like to do about further treatment. Further treatment possibly including tracheostomy. Again, RN explained that this would need to be a conversation they would need to have with the  physician about plan of care and that RN can only and will be willing to answer any questions the family might have. Family also expressed concern about a fecal occult not being ordered on patient. Son specifically said "I ordered a fecal occult on the patient and it was not done. Why is that?"  RN told family that unless the attending physician orders the fecal occult, it will not be done and if the physician didn't order it there was  likely a reason for them not doing so.  Family then inquired about patient receiving the drug Aranesp to help produce red blood cells. RN stated that the they started giving the patient 60 mcg subQ daily starting 09/09/2019. The son then said "wait, you mean to tell me that you just started this today and she has been there for weeks?" RN explained that the physicians likely did not start the drug daily until now due to the fact that it would not have made much of a difference in patient's hgb due to it being critically low but they can follow up with the physician tomorrow when they come to the hospital. RN also explained that they had the patient on Aranesp one a week but just now switched it to daily because patient's hgb was not improving. Son then said "Okay. Thank you" then hung up the phone. Will continue to monitor patient and answer any questions from family.

## 2019-09-09 NOTE — Progress Notes (Addendum)
Referring Physician(s): Melvenia Beam  Supervising Physician: Luanne Bras  Patient Status:  [facility] - In-pt  Chief Complaint: None- intubated with sedation  Subjective:  Right ICA cervical/petrous junction stenosis s/p revascularization using balloon angioplasty via right radial and right femoral approach6/14/2021by Dr. Estanislado Pandy. Patientintubated withsedation.Sheopens eyes to voice and follows simple commands. Can spontaneously move right sidewith left-sided neglect- no change. Right groin and right radial puncture sites c/d/i. She was extubated yesterday and unfortunately had to be re-intubated for airway protection. Most recent today was 4.3 (down from 4.5 09/07/2019). No family at bedside this AM.   Allergies: Patient has no known allergies.  Medications: Prior to Admission medications   Medication Sig Start Date End Date Taking? Authorizing Provider  amLODipine (NORVASC) 10 MG tablet Take 1 tablet (10 mg total) by mouth daily. 05/12/19  Yes Minette Brine, FNP  aspirin EC 81 MG tablet Take 81 mg by mouth every evening.   Yes [provider]  Calcium Carbonate-Vitamin D (CALCIUM-D PO) Take 2 tablets by mouth daily at 12 noon.    Yes [provider]  carvedilol (COREG) 6.25 MG tablet TAKE 1 TABLET(6.25 MG) BY MOUTH TWICE DAILY Patient taking differently: Take 6.25 mg by mouth 2 (two) times daily with a meal.  07/31/19  Yes Minette Brine, FNP  hydrALAZINE (APRESOLINE) 50 MG tablet Take 1 tablet (50 mg total) by mouth 2 (two) times daily. 06/16/19 09/14/19 Yes Elouise Munroe, MD  Insulin Degludec-Liraglutide (XULTOPHY) 100-3.6 UNIT-MG/ML SOPN Inject 30 Units into the skin daily. 08/14/19  Yes Minette Brine, FNP  telmisartan-hydrochlorothiazide (MICARDIS HCT) 40-12.5 MG tablet Take 1 tablet by mouth daily. 07/31/19  Yes Minette Brine, FNP  ticagrelor (BRILINTA) 90 MG TABS tablet Take 1 tablet (90 mg total) by mouth 2 (two) times daily. 06/12/19  Yes  Elouise Munroe, MD  vitamin C (ASCORBIC ACID) 250 MG tablet Take 500 mg by mouth daily at 12 noon.   Yes [provider]  blood glucose meter kit and supplies KIT Dispense based on patient and insurance preference. Use up to four times daily as directed. (FOR ICD-9 250.00, 250.01). 05/10/18   Minette Brine, FNP  Blood Glucose Monitoring Suppl (TRUE METRIX METER) w/Device KIT 1 each by Does not apply route in the morning, at noon, in the evening, and at bedtime. 05/22/19   Minette Brine, FNP  Evolocumab (REPATHA SURECLICK) 962 MG/ML SOAJ Inject 140 mg into the skin every 14 (fourteen) days. 06/30/19   Elouise Munroe, MD  glucose blood (TRUE METRIX BLOOD GLUCOSE TEST) test strip Check blood sugar 4 times a day before meals and bedtime 05/22/19   Minette Brine, FNP  Insulin Glargine-Lixisenatide (SOLIQUA) 100-33 UNT-MCG/ML SOPN Inject 25 Units into the skin daily. Patient taking differently: Inject 30 Units into the skin daily.  07/14/19   Minette Brine, FNP  Multiple Vitamin (MULTIVITAMIN PO) Take 1 tablet by mouth daily at 12 noon.     [provider]  Omega-3 Fatty Acids (OMEGA-3 PLUS PO) Take 2 capsules by mouth daily at 12 noon.    [provider]     Vital Signs: BP (!) 126/53   Pulse 74   Temp 98.9 F (37.2 C) (Axillary)   Resp 18   Ht 5' 1"  (1.549 m)   Wt 177 lb 7.5 oz (80.5 kg)   SpO2 100%   BMI 33.53 kg/m   Physical Exam Vitals and nursing note reviewed.  Constitutional:      General: She is  not in acute distress.    Comments: Intubated with sedation.   Pulmonary:     Effort: Pulmonary effort is normal. No respiratory distress.     Comments: Intubated with sedation.  Skin:    General: Skin is warm and dry.     Comments: Right groin puncture site soft without active bleeding or hematoma. Right radial puncture site soft without active bleeding or hematoma.    Neurological:     Mental Status: She is alert.     Comments: Intubated with sedation.  She opens eyes to voice and follows simple commands. PERRL bilaterally. Can spontaneously move right side but no spontaneous movements of left side (left-sided neglect). Distal pulses (DPs) 1+ bilaterally and (radial) 2+ bilaterally.       Imaging: DG CHEST PORT 1 VIEW  Result Date: 09/08/2019 CLINICAL DATA:  Hypoxia EXAM: PORTABLE CHEST 1 VIEW COMPARISON:  September 03, 2019 FINDINGS: Endotracheal tube tip is 3.8 cm above the carina. Central catheter tip is in the superior vena cava. Feeding tube tip is below the diaphragm. No pneumothorax. There is atelectatic change in the left mid lung and lung base regions. No edema or airspace opacity. Heart size and pulmonary vascularity are normal. There is aortic atherosclerosis. No adenopathy. Note that stomach is mildly distended with air. IMPRESSION: Tube and catheter positions as described without pneumothorax. Areas of mild atelectasis without edema or airspace opacity. Cardiac silhouette normal. Aortic Atherosclerosis (ICD10-I70.0). Stomach mildly distended with air. Electronically Signed   By: Lowella Grip III M.D.   On: 09/08/2019 14:41    Labs:  CBC: Recent Labs    09/03/19 0834 09/03/19 0834 09/03/19 2106 09/05/19 0618 09/07/19 0512 09/09/19 0525  WBC 9.1  --   --  8.0 8.4 6.5  HGB 5.1*   < > 5.1* 4.8* 4.5* 4.3*  HCT 18.5*   < > 15.0* 17.7* 15.7* 15.6*  PLT 229  --   --  184 175 239   < > = values in this interval not displayed.    COAGS: Recent Labs    11/27/18 0728 08/18/19 0705 08/26/19 1715 08/27/19 0230  INR 0.9 0.9 1.1 1.2    BMP: Recent Labs    09/03/19 0834 09/03/19 1633 09/03/19 2106 09/05/19 0618 09/07/19 0512 09/09/19 0525  NA 162*   < > 159* 150* 144 146*  K 3.6  --  3.4* 3.6 3.4* 3.8  CL 117*  --   --  110 108 110  CO2 35*  --   --  31 28 29   GLUCOSE 111*  --   --  290* 148* 170*  BUN 23  --   --  27* 23 24*  CALCIUM 9.2  --   --  8.6* 8.1* 8.4*  CREATININE 0.90  --   --  0.75 0.76 0.76   GFRNONAA >60  --   --  >60 >60 >60  GFRAA >60  --   --  >60 >60 >60   < > = values in this interval not displayed.    LIVER FUNCTION TESTS: Recent Labs    09/03/19 0834 09/05/19 0618 09/07/19 0512 09/09/19 0525  BILITOT 0.8 1.0 0.8 0.4  AST 23 26 34 28  ALT 17 21 28 29   ALKPHOS 58 56 46 49  PROT 6.3* 5.8* 5.5* 5.8*  ALBUMIN 2.2* 2.0* 1.8* 1.7*    Assessment and Plan:  Right ICA cervical/petrous junction stenosis s/p revascularization using balloon angioplasty via right radial and right femoral approach6/14/2021by Dr.  [name]. Mild-moderate right thigh (with some retroperitoneal component) hematoma. Patient's condition stable- remainsintubated with sedation(weaning),opens eyes to voice and follows simple commands, has spontaneous movements of right side but no spontaneous movements of left side. Right groinand right radial puncture sitesstable, no signs of active bleeding. Continue to hold Brilinta/Aspirin.Agree with CCM plans to trent labs QOD to avoid excess blood drawl. Further plans per CCM/neurology/palliative care- appreciate and agree with management. NIR to follow.   Electronically Signed: Earley Abide, PA-C 09/09/2019, 10:51 AM   I spent a total of 25 Minutes at the the patient's bedside AND on the patient's hospital floor or unit, greater than 50% of which was counseling/coordinating care for right ICA stenosis s/p revascularization.

## 2019-09-10 LAB — CBC
HCT: 16.3 % — ABNORMAL LOW (ref 36.0–46.0)
Hemoglobin: 4.4 g/dL — CL (ref 12.0–15.0)
MCH: 29.7 pg (ref 26.0–34.0)
MCHC: 27 g/dL — ABNORMAL LOW (ref 30.0–36.0)
MCV: 110.1 fL — ABNORMAL HIGH (ref 80.0–100.0)
Platelets: 274 10*3/uL (ref 150–400)
RBC: 1.48 MIL/uL — ABNORMAL LOW (ref 3.87–5.11)
RDW: 20.3 % — ABNORMAL HIGH (ref 11.5–15.5)
WBC: 6.9 10*3/uL (ref 4.0–10.5)
nRBC: 2.3 % — ABNORMAL HIGH (ref 0.0–0.2)

## 2019-09-10 LAB — GLUCOSE, CAPILLARY
Glucose-Capillary: 119 mg/dL — ABNORMAL HIGH (ref 70–99)
Glucose-Capillary: 174 mg/dL — ABNORMAL HIGH (ref 70–99)
Glucose-Capillary: 205 mg/dL — ABNORMAL HIGH (ref 70–99)
Glucose-Capillary: 152 mg/dL — ABNORMAL HIGH (ref 70–99)
Glucose-Capillary: 141 mg/dL — ABNORMAL HIGH (ref 70–99)
Glucose-Capillary: 154 mg/dL — ABNORMAL HIGH (ref 70–99)

## 2019-09-10 MED ORDER — VITAMIN B-12 1000 MCG PO TABS
1000.0000 ug | ORAL_TABLET | Freq: Every day | ORAL | Status: AC
  Administered 2019-09-10 – 2019-09-18 (×9): 1000 ug
  Filled 2019-09-10 (×9): qty 1

## 2019-09-10 NOTE — Progress Notes (Signed)
Referring Physician(s): Melvenia Beam  Supervising Physician: Luanne Bras  Patient Status:  [facility] - In-pt  Chief Complaint: None- intubated with sedation  Subjective:  Right ICA cervical/petrous junction stenosis s/p revascularization using balloon angioplasty via right radial and right femoral approach6/14/2021by Dr. Estanislado Pandy. Patientintubated withsedation.Sheopens eyes to voiceand follows simple commands. Can spontaneously move right sidewith left-sided neglect- no change. Right groin and right radial puncture sites c/d/i. Most recent hgb was4.3 yesterday(down from 4.56/27/2021). No family at bedside this AM.   Allergies: Patient has no known allergies.  Medications: Prior to Admission medications   Medication Sig Start Date End Date Taking? Authorizing Provider  amLODipine (NORVASC) 10 MG tablet Take 1 tablet (10 mg total) by mouth daily. 05/12/19  Yes Minette Brine, FNP  aspirin EC 81 MG tablet Take 81 mg by mouth every evening.   Yes [provider]  Calcium Carbonate-Vitamin D (CALCIUM-D PO) Take 2 tablets by mouth daily at 12 noon.    Yes [provider]  carvedilol (COREG) 6.25 MG tablet TAKE 1 TABLET(6.25 MG) BY MOUTH TWICE DAILY Patient taking differently: Take 6.25 mg by mouth 2 (two) times daily with a meal.  07/31/19  Yes Minette Brine, FNP  hydrALAZINE (APRESOLINE) 50 MG tablet Take 1 tablet (50 mg total) by mouth 2 (two) times daily. 06/16/19 09/14/19 Yes Elouise Munroe, MD  Insulin Degludec-Liraglutide (XULTOPHY) 100-3.6 UNIT-MG/ML SOPN Inject 30 Units into the skin daily. 08/14/19  Yes Minette Brine, FNP  telmisartan-hydrochlorothiazide (MICARDIS HCT) 40-12.5 MG tablet Take 1 tablet by mouth daily. 07/31/19  Yes Minette Brine, FNP  ticagrelor (BRILINTA) 90 MG TABS tablet Take 1 tablet (90 mg total) by mouth 2 (two) times daily. 06/12/19  Yes Elouise Munroe, MD  vitamin C (ASCORBIC ACID) 250 MG tablet Take 500 mg by mouth daily  at 12 noon.   Yes [provider]  blood glucose meter kit and supplies KIT Dispense based on patient and insurance preference. Use up to four times daily as directed. (FOR ICD-9 250.00, 250.01). 05/10/18   Minette Brine, FNP  Blood Glucose Monitoring Suppl (TRUE METRIX METER) w/Device KIT 1 each by Does not apply route in the morning, at noon, in the evening, and at bedtime. 05/22/19   Minette Brine, FNP  Evolocumab (REPATHA SURECLICK) 546 MG/ML SOAJ Inject 140 mg into the skin every 14 (fourteen) days. 06/30/19   Elouise Munroe, MD  glucose blood (TRUE METRIX BLOOD GLUCOSE TEST) test strip Check blood sugar 4 times a day before meals and bedtime 05/22/19   Minette Brine, FNP  Insulin Glargine-Lixisenatide (SOLIQUA) 100-33 UNT-MCG/ML SOPN Inject 25 Units into the skin daily. Patient taking differently: Inject 30 Units into the skin daily.  07/14/19   Minette Brine, FNP  Multiple Vitamin (MULTIVITAMIN PO) Take 1 tablet by mouth daily at 12 noon.     [provider]  Omega-3 Fatty Acids (OMEGA-3 PLUS PO) Take 2 capsules by mouth daily at 12 noon.    [provider]     Vital Signs: BP (!) 137/50   Pulse 77   Temp 99.6 F (37.6 C) (Axillary)   Resp 18   Ht _0  (1.549 m)   Wt 177 lb 7.5 oz (80.5 kg)   SpO2 100%   BMI 33.53 kg/m   Physical Exam Vitals and nursing note reviewed.  Constitutional:      General: She is not in acute distress.    Comments: Intubated with sedation.  Pulmonary:  Effort: Pulmonary effort is normal. No respiratory distress.     Comments: Intubated with sedation. Skin:    General: Skin is warm and dry.     Comments: Right groin puncture site soft without active bleeding or hematoma. Right radial puncture site soft without active bleeding or hematoma.    Neurological:     Comments: Intubated with sedation. She opens eyes to voice and follows simple commands. PERRL bilaterally. Can spontaneously move right side but no spontaneous  movements of left side (left-sided neglect). Distal pulses (DPs) 1+ bilaterally and (radial) 2+ bilaterally.        Imaging: MR BRAIN WO CONTRAST  Result Date: 09/09/2019 CLINICAL DATA:  69 year old female status post right ICA angioplasty with scattered small infarcts mostly in the right hemisphere on 08/26/2019. Acute blood loss anemia. Subsequent encounter. EXAM: MRI HEAD WITHOUT CONTRAST TECHNIQUE: Multiplanar, multiecho pulse sequences of the brain and surrounding structures were obtained without intravenous contrast. COMPARISON:  Brain MRI 08/26/2019. FINDINGS: Brain: Substantially increased size and confluence of multifocal restricted diffusion in the right cerebral hemisphere since 08/26/2019. Confluent involvement of the posterior right MCA and the superior right PCA territory white matter (series 5, image 84). This progression is relatively spared the cortex. And there is associated confluent T2 and FLAIR hyperintensity with evidence of petechial blood products (right occipital white matter series 14, image 31), but no malignant hemorrhagic transformation. There are also several new small foci of restricted diffusion elsewhere including the anterior left frontal lobe (also on image 84), left basal ganglia (series 5, image 78), right cerebellum, and brainstem including the left medullary pyramid on series 5, image 61. Superimposed chronic right basal ganglia infarct with hemosiderin and ex vacuo enlargement of the right lateral ventricle. Chronic left lateral cerebellar infarct is stable. No midline shift, mass effect, evidence of mass lesion, ventriculomegaly, extra-axial collection. Cervicomedullary junction and pituitary are within normal limits. Vascular: Major intracranial vascular flow voids are stable. Skull and upper cervical spine: Decreased T1 marrow signal in the skull and cervical spine might indicate red marrow reactivation. Sinuses/Orbits: Negative orbits. Paranasal sinus fluid and  mucosal thickening not significantly changed. Other: Mastoids remain well pneumatized. Small volume retained secretions now in the nasopharynx. IMPRESSION: 1. Progressed size and confluence of ischemia throughout much of the right hemisphere white matter since 08/26/2019. Associated petechial blood products but no malignant hemorrhagic transformation or mass effect. 2. There are also multiple new lacunar type infarcts elsewhere, including the brainstem and the left basal ganglia. 3. Underlying chronic infarcts of the right basal ganglia and left cerebellum. 4. Suspect interval red marrow reactivation in the skull and cervical spine. Electronically Signed   By: Genevie Ann M.D.   On: 09/09/2019 15:19   DG CHEST PORT 1 VIEW  Result Date: 09/08/2019 CLINICAL DATA:  Hypoxia EXAM: PORTABLE CHEST 1 VIEW COMPARISON:  September 03, 2019 FINDINGS: Endotracheal tube tip is 3.8 cm above the carina. Central catheter tip is in the superior vena cava. Feeding tube tip is below the diaphragm. No pneumothorax. There is atelectatic change in the left mid lung and lung base regions. No edema or airspace opacity. Heart size and pulmonary vascularity are normal. There is aortic atherosclerosis. No adenopathy. Note that stomach is mildly distended with air. IMPRESSION: Tube and catheter positions as described without pneumothorax. Areas of mild atelectasis without edema or airspace opacity. Cardiac silhouette normal. Aortic Atherosclerosis (ICD10-I70.0). Stomach mildly distended with air. Electronically Signed   By: Lowella Grip III M.D.   On: 09/08/2019  14:41    Labs:  CBC: Recent Labs    09/03/19 0834 09/03/19 0834 09/03/19 2106 09/05/19 0618 09/07/19 0512 09/09/19 0525  WBC 9.1  --   --  8.0 8.4 6.5  HGB 5.1*   < > 5.1* 4.8* 4.5* 4.3*  HCT 18.5*   < > 15.0* 17.7* 15.7* 15.6*  PLT 229  --   --  184 175 239   < > = values in this interval not displayed.    COAGS: Recent Labs    11/27/18 0728 08/18/19 0705  08/26/19 1715 08/27/19 0230  INR 0.9 0.9 1.1 1.2    BMP: Recent Labs    09/03/19 0834 09/03/19 1633 09/03/19 2106 09/05/19 0618 09/07/19 0512 09/09/19 0525  NA 162*   < > 159* 150* 144 146*  K 3.6  --  3.4* 3.6 3.4* 3.8  CL 117*  --   --  110 108 110  CO2 35*  --   --  _0 GLUCOSE 111*  --   --  290* 148* 170*  BUN 23  --   --  27* 23 24*  CALCIUM 9.2  --   --  8.6* 8.1* 8.4*  CREATININE 0.90  --   --  0.75 0.76 0.76  GFRNONAA >60  --   --  >60 >60 >60  GFRAA >60  --   --  >60 >60 >60   < > = values in this interval not displayed.    LIVER FUNCTION TESTS: Recent Labs    09/03/19 0834 09/05/19 0618 09/07/19 0512 09/09/19 0525  BILITOT 0.8 1.0 0.8 0.4  AST 23 26 34 28  ALT _1 ALKPHOS 58 56 46 49  PROT 6.3* 5.8* 5.5* 5.8*  ALBUMIN 2.2* 2.0* 1.8* 1.7*    Assessment and Plan:  Right ICA cervical/petrous junction stenosis s/p revascularization using balloon angioplasty via right radial and right femoral approach6/14/2021by Dr. Estanislado Pandy. Mild-moderate right thigh (with some retroperitoneal component) hematoma. Patient's condition stable- remainsintubated with sedation(weaning),opens eyes to voice and follows simple commands, has spontaneous movements of right side but no spontaneous movements of left side. Right groinand right radial puncture sitesstable, no signs of active bleeding. Continue to hold Brilinta/Aspirin/Lovenox.Agree with CCM plans to avoid excess blood drawl. Further plans per CCM/neurology/palliative care- appreciate and agree with management. NIR to follow.   Electronically Signed: Earley Abide, PA-C 09/10/2019, 10:12 AM   I spent a total of 25 Minutes at the the patient's bedside AND on the patient's hospital floor or unit, greater than 50% of which was counseling/coordinating care for right ICA stenosis s/p revascularization.

## 2019-09-10 NOTE — Progress Notes (Signed)
NAME:  Donna [name], MRN:  [id number], DOB:  11-26-1950, LOS: 16 ADMISSION DATE:  08/25/2019, CONSULTATION DATE:  08/27/2019 REFERRING MD:  Dr. 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  Was placed on ASA/ brillinta.  Monitored in Neuro ICU.  Noted to have developed hypotension, increased left sided weakness and lethargy on 6/15 am with Hgb drop from 13.3 to 6.9 with new AKI.  Post MRI/ MRA showed scattered small acute infarcts along the right MCA/ watershed territory suspected to be resultant either post procedure vs hypoperfusion.  Neurology was consulted with recommendations to keep SBP > 140 for cerebral perfusion.  A CT abd/ pelvis was obtained given concern for retroperitoneal bleed which showed a mild to moderate amount of retroperitoneal hematoma in the right iliac and inguinal regiones with moderate size hematoma noted the the soft tissues anterior to the musculature of the right hip; no definite intrapertioneal hemorrhage. INR, PT, and platelets rechecked and were wnl.  Vascular surgery was consulted with plans to observe given bleeding was mostly into the muscle and tissue.  She was started on neosynephrine to keep SBP goal > 140-160 for cerebral perfusion.  PCCM consulted to help with vasopressor support.  S/p 1L bolus  Repeat Hgb now 6.9-> 7-> 5.1.   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    ETT 6/19 >6/23,  6/23>>  Significant Diagnostic Tests:   6/15 MRA/ MRI brain  >> 1. Scattered small acute infarcts along the right MCA/watershed territory. Solitary small acute infarct in the left parietal cortex and right cerebellum. 2. Improved right ICA patency at the petrous segment. 3. Known severe right M1 segment stenosis. 4. 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  6/15 CT a/p wo contrast >> 1. Mild to moderate amount of retroperitoneal hematoma is noted in the right iliac and inguinal regions. Moderate size hematoma is noted in the soft tissues anterior to the musculature of the right hip. No definite intraperitoneal hemorrhage is noted. 2. Moderate size fat containing periumbilical hernia. Aortic Atherosclerosis   Echo 6/16 >> normal LVEF  Micro Data:  6/10 SARS 2 >> neg 6/14 MRSA PCR >> neg 6/20 trach asp >> few diptheroids/ corynebacterium species 6/18 BC x 2 >> neg   Antimicrobials:  6/14 cefazolin pre-op  6/20 unasyn >> 09/02/2019  Interim history/subjective:  Does not meet the criteria for extubation at this time  Objective   Blood pressure (!) 131/52, pulse 78, temperature 99.6 F (37.6 C), temperature source Axillary, resp. rate 18, height 5\' 1"  (1.549 m), weight 80.5 kg, SpO2 100 %.    Vent Mode: PRVC FiO2 (%):  [40 %] 40 % Set Rate:  [18 bmp] 18 bmp Vt  Set:  [410 mL] 410 mL PEEP:  [5 cmH20] 5 cmH20 Pressure Support:  [10 cmH20] 10 cmH20 Plateau Pressure:  [12 cmH20-17 cmH20] 12 cmH20   Intake/Output Summary (Last 24 hours) at 09/10/2019 0901 Last data filed at 09/10/2019 0800 Gross per 24 hour  Intake 2342.44 ml  Output 905  ml  Net 1437.44 ml   Filed Weights   09/06/19 0500 09/07/19 0500 09/08/19 0500  Weight: 78 kg 80 kg 80.5 kg    Examination: General: Morbid obese female who is poorly responsive at this time on Precedex HEENT: Endotracheal tube is in place. Neuro: Follows some commands, reported to be hard of hearing, moves right foot greater than right arm left side with hemiplegia CV: Heart sounds are distant PULM: Decreased breath sounds in the Vent basis pressure regulated volume control with a rate of 18 FIO2 40% PEEP 5   GI: soft, bsx4 active  GU: Extremities: warm/dry,  edema  Skin: no rashes or lesions   Resolved Hospital Problem list   Hypotension  AKI  Assessment & Plan:   Right MCA CVA due to bilateral intracranial stenoses s/p RT ICA cer/petrous junction balloon angioplasty for severe stenosis. Persistent left-sided weakness. Continue secondary stroke prevention Given high risk of bleeding avoid antiplatelet medications    Critically ill due to acute respiratory failure with hypoxia requiring mechanical ventilation.  Failed trial of extubation due to upper airway stridor.  Cords visibly scarred on reintubation. Continue to monitor for weaning Currently on full mechanical dilatory support at time of examination   Acute blood loss anemia - Jehovah's Witness: no blood products including albumin  Currently on severe anemia protocol of iron vitamin C vitamin B12 folic acid and Epogen Limit blood draws  Daily Goals Checklist  Pain/Anxiety/Delirium protocol (if indicated): Precedex Neuro vitals: every 4 hours hours AED's: None VAP protocol (if indicated): Bundle in place Respiratory support goals: Daily SBT Blood pressure target: Keep systolic blood pressure less than 160 DVT prophylaxis: Mechanical DVT prophylaxis only given anemia and high bleeding risk. Nutrition Status: Tolerating tube feeds. GI prophylaxis: Pantoprazole Fluid status goals: Allow  autoregulation Urinary catheter: External catheter Central lines: PICC line Glucose control: Adequate glycemic control, type 2 diabetes. Mobility/therapy needs: PT OT Antibiotic de-escalation: No antibiotics Home medication reconciliation: On hold Daily labs: CBC daily. Code Status: Full code. Family Communication: Family meeting yesterday.  Goal to proceed with tracheostomy and PEG tube. Disposition: ICU.  App cct 40 min  Brett Canales Bristyn Kulesza ACNP Acute Care Nurse Practitioner [facility] Pulmonary/Critical Care Please consult Amion 09/10/2019, 9:01 AM

## 2019-09-11 LAB — CBC
HCT: 16.3 % — ABNORMAL LOW (ref 36.0–46.0)
Hemoglobin: 4.4 g/dL — CL (ref 12.0–15.0)
MCH: 29.7 pg (ref 26.0–34.0)
MCHC: 27 g/dL — ABNORMAL LOW (ref 30.0–36.0)
MCV: 110.1 fL — ABNORMAL HIGH (ref 80.0–100.0)
Platelets: 304 10*3/uL (ref 150–400)
RBC: 1.48 MIL/uL — ABNORMAL LOW (ref 3.87–5.11)
RDW: 20.4 % — ABNORMAL HIGH (ref 11.5–15.5)
WBC: 7 10*3/uL (ref 4.0–10.5)
nRBC: 2.3 % — ABNORMAL HIGH (ref 0.0–0.2)

## 2019-09-11 LAB — COMPREHENSIVE METABOLIC PANEL
ALT: 28 U/L (ref 0–44)
AST: 23 U/L (ref 15–41)
Albumin: 1.7 g/dL — ABNORMAL LOW (ref 3.5–5.0)
Alkaline Phosphatase: 51 U/L (ref 38–126)
Anion gap: 5 (ref 5–15)
BUN: 21 mg/dL (ref 8–23)
CO2: 30 mmol/L (ref 22–32)
Calcium: 8.5 mg/dL — ABNORMAL LOW (ref 8.9–10.3)
Chloride: 111 mmol/L (ref 98–111)
Creatinine, Ser: 0.69 mg/dL (ref 0.44–1.00)
GFR calc Af Amer: >60 mL/min (ref >60)
GFR calc non Af Amer: >60 mL/min (ref >60)
Glucose, Bld: 214 mg/dL — ABNORMAL HIGH (ref 70–99)
Potassium: 4 mmol/L (ref 3.5–5.1)
Sodium: 146 mmol/L — ABNORMAL HIGH (ref 135–145)
Total Bilirubin: 0.7 mg/dL (ref 0.3–1.2)
Total Protein: 6 g/dL — ABNORMAL LOW (ref 6.5–8.1)

## 2019-09-11 LAB — GLUCOSE, CAPILLARY
Glucose-Capillary: 187 mg/dL — ABNORMAL HIGH (ref 70–99)
Glucose-Capillary: 186 mg/dL — ABNORMAL HIGH (ref 70–99)
Glucose-Capillary: 173 mg/dL — ABNORMAL HIGH (ref 70–99)
Glucose-Capillary: 173 mg/dL — ABNORMAL HIGH (ref 70–99)
Glucose-Capillary: 224 mg/dL — ABNORMAL HIGH (ref 70–99)
Glucose-Capillary: 148 mg/dL — ABNORMAL HIGH (ref 70–99)

## 2019-09-11 MED ORDER — FAMOTIDINE 20 MG PO TABS
20.0000 mg | ORAL_TABLET | Freq: Two times a day (BID) | ORAL | Status: DC
  Administered 2019-09-11 – 2019-11-01 (×98): 20 mg
  Filled 2019-09-11 (×99): qty 1

## 2019-09-11 NOTE — Progress Notes (Signed)
  Referring Physician(s): Ahern, Antonia B  Supervising Physician: [name], Sanjeev  Patient Status:  [facility] - In-pt  Chief Complaint: None- intubated with sedation  Subjective: Remains intubated with sedation.  Follows simple commands. HgB stable at 4.4 today.  No signs of active bleeding.  Thigh soft.   No family at bedside this AM.   Allergies: Patient has no known allergies.  Medications: Prior to Admission medications   Medication Sig Start Date End Date Taking? Authorizing Provider  amLODipine (NORVASC) 10 MG tablet Take 1 tablet (10 mg total) by mouth daily. 05/12/19  Yes Moore, [name], FNP  aspirin EC 81 MG tablet Take 81 mg by mouth every evening.   Yes [provider]  Calcium Carbonate-Vitamin D (CALCIUM-D PO) Take 2 tablets by mouth daily at 12 noon.    Yes [provider]  carvedilol (COREG) 6.25 MG tablet TAKE 1 TABLET(6.25 MG) BY MOUTH TWICE DAILY Patient taking differently: Take 6.25 mg by mouth 2 (two) times daily with a meal.  07/31/19  Yes Moore, [name], FNP  hydrALAZINE (APRESOLINE) 50 MG tablet Take 1 tablet (50 mg total) by mouth 2 (two) times daily. 06/16/19 09/14/19 Yes Acharya, Gayatri A, MD  Insulin Degludec-Liraglutide (XULTOPHY) 100-3.6 UNIT-MG/ML SOPN Inject 30 Units into the skin daily. 08/14/19  Yes Moore, [name], FNP  telmisartan-hydrochlorothiazide (MICARDIS HCT) 40-12.5 MG tablet Take 1 tablet by mouth daily. 07/31/19  Yes Moore, [name], FNP  ticagrelor (BRILINTA) 90 MG TABS tablet Take 1 tablet (90 mg total) by mouth 2 (two) times daily. 06/12/19  Yes Acharya, Gayatri A, MD  vitamin C (ASCORBIC ACID) 250 MG tablet Take 500 mg by mouth daily at 12 noon.   Yes [provider]  blood glucose meter kit and supplies KIT Dispense based on patient and insurance preference. Use up to four times daily as directed. (FOR ICD-9 250.00, 250.01). 05/10/18   Moore, [name], FNP  Blood Glucose Monitoring Suppl (TRUE METRIX METER) w/Device KIT 1  each by Does not apply route in the morning, at noon, in the evening, and at bedtime. 05/22/19   Moore, [name], FNP  Evolocumab (REPATHA SURECLICK) 140 MG/ML SOAJ Inject 140 mg into the skin every 14 (fourteen) days. 06/30/19   Acharya, Gayatri A, MD  glucose blood (TRUE METRIX BLOOD GLUCOSE TEST) test strip Check blood sugar 4 times a day before meals and bedtime 05/22/19   Moore, [name], FNP  Insulin Glargine-Lixisenatide (SOLIQUA) 100-33 UNT-MCG/ML SOPN Inject 25 Units into the skin daily. Patient taking differently: Inject 30 Units into the skin daily.  07/14/19   Moore, [name], FNP  Multiple Vitamin (MULTIVITAMIN PO) Take 1 tablet by mouth daily at 12 noon.     [provider]  Omega-3 Fatty Acids (OMEGA-3 PLUS PO) Take 2 capsules by mouth daily at 12 noon.    [provider]     Vital Signs: BP (!) 159/69   Pulse 99   Temp 98.4 F (36.9 C) (Axillary)   Resp (!) 21   Ht 5' 1" (1.549 m)   Wt 177 lb 7.5 oz (80.5 kg)   SpO2 100%   BMI 33.53 kg/m   Physical Exam Vitals and nursing note reviewed.    Intubated with sedation. She opens eyes to voice and follows simple commands.  Grimaces when spoken to.  PERRL bilaterally. Neuro: Can spontaneously move right side but no spontaneous movements of left side.  Groin: Right groin puncture site soft without active bleeding or hematoma. Right radial puncture site soft   without active bleeding or hematoma.  Thigh soft, compressible.    Imaging: MR BRAIN WO CONTRAST  Result Date: 09/09/2019 CLINICAL DATA:  69 year old female status post right ICA angioplasty with scattered small infarcts mostly in the right hemisphere on 08/26/2019. Acute blood loss anemia. Subsequent encounter. EXAM: MRI HEAD WITHOUT CONTRAST TECHNIQUE: Multiplanar, multiecho pulse sequences of the brain and surrounding structures were obtained without intravenous contrast. COMPARISON:  Brain MRI 08/26/2019. FINDINGS: Brain: Substantially increased size and  confluence of multifocal restricted diffusion in the right cerebral hemisphere since 08/26/2019. Confluent involvement of the posterior right MCA and the superior right PCA territory white matter (series 5, image 84). This progression is relatively spared the cortex. And there is associated confluent T2 and FLAIR hyperintensity with evidence of petechial blood products (right occipital white matter series 14, image 31), but no malignant hemorrhagic transformation. There are also several new small foci of restricted diffusion elsewhere including the anterior left frontal lobe (also on image 84), left basal ganglia (series 5, image 78), right cerebellum, and brainstem including the left medullary pyramid on series 5, image 61. Superimposed chronic right basal ganglia infarct with hemosiderin and ex vacuo enlargement of the right lateral ventricle. Chronic left lateral cerebellar infarct is stable. No midline shift, mass effect, evidence of mass lesion, ventriculomegaly, extra-axial collection. Cervicomedullary junction and pituitary are within normal limits. Vascular: Major intracranial vascular flow voids are stable. Skull and upper cervical spine: Decreased T1 marrow signal in the skull and cervical spine might indicate red marrow reactivation. Sinuses/Orbits: Negative orbits. Paranasal sinus fluid and mucosal thickening not significantly changed. Other: Mastoids remain well pneumatized. Small volume retained secretions now in the nasopharynx. IMPRESSION: 1. Progressed size and confluence of ischemia throughout much of the right hemisphere white matter since 08/26/2019. Associated petechial blood products but no malignant hemorrhagic transformation or mass effect. 2. There are also multiple new lacunar type infarcts elsewhere, including the brainstem and the left basal ganglia. 3. Underlying chronic infarcts of the right basal ganglia and left cerebellum. 4. Suspect interval red marrow reactivation in the skull and  cervical spine. Electronically Signed   By: Genevie Ann M.D.   On: 09/09/2019 15:19   DG CHEST PORT 1 VIEW  Result Date: 09/08/2019 CLINICAL DATA:  Hypoxia EXAM: PORTABLE CHEST 1 VIEW COMPARISON:  September 03, 2019 FINDINGS: Endotracheal tube tip is 3.8 cm above the carina. Central catheter tip is in the superior vena cava. Feeding tube tip is below the diaphragm. No pneumothorax. There is atelectatic change in the left mid lung and lung base regions. No edema or airspace opacity. Heart size and pulmonary vascularity are normal. There is aortic atherosclerosis. No adenopathy. Note that stomach is mildly distended with air. IMPRESSION: Tube and catheter positions as described without pneumothorax. Areas of mild atelectasis without edema or airspace opacity. Cardiac silhouette normal. Aortic Atherosclerosis (ICD10-I70.0). Stomach mildly distended with air. Electronically Signed   By: Lowella Grip III M.D.   On: 09/08/2019 14:41    Labs:  CBC: Recent Labs    09/07/19 0512 09/09/19 0525 09/10/19 1134 09/11/19 0412  WBC 8.4 6.5 6.9 7.0  HGB 4.5* 4.3* 4.4* 4.4*  HCT 15.7* 15.6* 16.3* 16.3*  PLT 175 239 274 304    COAGS: Recent Labs    11/27/18 0728 08/18/19 0705 08/26/19 1715 08/27/19 0230  INR 0.9 0.9 1.1 1.2    BMP: Recent Labs    09/05/19 0618 09/07/19 0512 09/09/19 0525 09/11/19 0412  NA 150* 144 146* 146*  K  3.6 3.4* 3.8 4.0  CL 110 108 110 111  CO2 _0 GLUCOSE 290* 148* 170* 214*  BUN 27* 23 24* 21  CALCIUM 8.6* 8.1* 8.4* 8.5*  CREATININE 0.75 0.76 0.76 0.69  GFRNONAA >60 >60 >60 >60  GFRAA >60 >60 >60 >60    LIVER FUNCTION TESTS: Recent Labs    09/05/19 0618 09/07/19 0512 09/09/19 0525 09/11/19 0412  BILITOT 1.0 0.8 0.4 0.7  AST 26 34 28 23  ALT _1 ALKPHOS 56 46 49 51  PROT 5.8* 5.5* 5.8* 6.0*  ALBUMIN 2.0* 1.8* 1.7* 1.7*    Assessment and Plan:  Right ICA cervical/petrous junction stenosis s/p revascularization using balloon  angioplasty via right radial and right femoral approach6/14/2021by Dr. Estanislado Pandy. Mild-moderate right thigh (with some retroperitoneal component) hematoma. Patient's condition stable- remainsintubated with sedation. She does follow simple commands despite precedex.opens eyes to voice and follows simple commands, has spontaneous movements of right side but no spontaneous movements of left side. Right groinand right radial puncture sitesstable, no signs of active bleeding.  Hgb stable at 4.4.  She is receiving supplementation per severe anemia protocol.   Continue to hold Brilinta/Aspirin/Lovenox.Agree with CCM plans to avoid excess blood drawl. Further plans per CCM/neurology/palliative care- appreciate and agree with management.  Currently full code with plans for trach/PEG once hemoglobin improves.  NIR to follow.  No family at bedside this AM  Electronically Signed: Docia Barrier, PA 09/11/2019, 10:13 AM   I spent a total of 25 Minutes at the the patient's bedside AND on the patient's hospital floor or unit, greater than 50% of which was counseling/coordinating care for right ICA stenosis s/p revascularization.

## 2019-09-11 NOTE — Progress Notes (Signed)
Nutrition Follow-up  DOCUMENTATION CODES:   Not applicable  INTERVENTION:   Tube feeding via Cortrak: - Vital AF 1.2 @ 50 ml/hr (1200 ml/day) - Pro-stat 30 ml daily - Free water per CCM, currently 30 ml q 4 hours  Tube feeding regimen provides 1540 kcal, 105 grams of protein, and 973 ml of H2O. Total free water: 1153 ml   NUTRITION DIAGNOSIS:   Inadequate oral intake related to inability to eat as evidenced by NPO status.  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  Pt with severe acute blood loss anemia related to right thigh hematoma and retroperitoneal hematoma. Per MD plan for trach/PEG once hemoglobin acceptable.   6/14 - s/p balloon angioplasty for severe stenosis, pt developed lethargy and increased L sided weakness found to have new watershed infarcts, started on vasopressors for cerebral perfusion 6/16 - cortrak placed, tip in stomach 6/17 - pt with increased agitation requiring increased O2 6/18 - pt pulled out Cortrak, Cortrak replaced 6/19 - Cortrak removed 6/20 - intubated 6/23 - Cortrak replaced (tip gastric per Cortrak team), extubated, later reintubated   Patient is currently intubated on ventilator support MV: 7.5 L/min Temp (24hrs), Avg:99.1 F (37.3 C), Min:98.4 F (36.9 C), Max:100.2 F (37.9 C) Medications reviewed and include: colace, pepcid, SSI q 4 hours, Novolog 4 units q 4 hours, Lantus 20 units daily, vitamin B12, ferric gluconate    Labs reviewed: sodium 146, hemoglobin: 4.4  CBG's: 173-186 x 24 hours  Diet Order:   Diet Order            Diet NPO time specified  Diet effective  now                 EDUCATION NEEDS:   Not appropriate for education at this time  Skin:  Skin Assessment: Skin Integrity Issues: Incisions: right groin  Last BM:  7/1  Height:   Ht Readings from Last 1 Encounters:  08/31/19 5' 1"  (1.549 m)    Weight:   Wt Readings from Last 1 Encounters:  09/08/19 80.5 kg    Ideal Body Weight:  47.7 kg  BMI:  Body mass index is 33.53 kg/m.  Estimated Nutritional Needs:   Kcal:  1485  Protein:  90-105 grams  Fluid:  >1.5 L/day  Lockie Pares., RD, LDN, CNSC See [facility] for contact information

## 2019-09-11 NOTE — Plan of Care (Signed)
  Problem: Nutrition: Goal: Adequate nutrition will be maintained Outcome: Progressing   

## 2019-09-11 NOTE — Progress Notes (Addendum)
NAME:  Donna [name], MRN:  [id number], DOB:  30-Apr-1950, LOS: 17 ADMISSION DATE:  08/25/2019, CONSULTATION DATE:  08/27/2019 REFERRING MD:  Dr. 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  Was placed on ASA/ brillinta.  Monitored in Neuro ICU.  Noted to have developed hypotension, increased left sided weakness and lethargy on 6/15 am with Hgb drop from 13.3 to 6.9 with new AKI.  Post MRI/ MRA showed scattered small acute infarcts along the right MCA/ watershed territory suspected to be resultant either post procedure vs hypoperfusion.  Neurology was consulted with recommendations to keep SBP > 140 for cerebral perfusion.  A CT abd/ pelvis was obtained given concern for retroperitoneal bleed which showed a mild to moderate amount of retroperitoneal hematoma in the right iliac and inguinal regiones with moderate size hematoma noted the the soft tissues anterior to the musculature of the right hip; no definite intrapertioneal hemorrhage. INR, PT, and platelets rechecked and were wnl.  Vascular surgery was consulted with plans to observe given bleeding was mostly into the muscle and tissue.  She was started on neosynephrine to keep SBP goal > 140-160 for cerebral perfusion.  PCCM consulted to help with vasopressor support.  S/p 1L bolus  Repeat Hgb now 6.9-> 7-> 5.1.   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    ETT 6/19 >6/23,  6/23>>  Significant Diagnostic Tests:   6/15 MRA/ MRI brain  >> 1. Scattered small acute infarcts along the right MCA/watershed territory. Solitary small acute infarct in the left parietal cortex and right cerebellum. 2. Improved right ICA patency at the petrous segment. 3. Known severe right M1 segment stenosis. 4. 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  6/15 CT a/p wo contrast >> 1. Mild to moderate amount of retroperitoneal hematoma is noted in the right iliac and inguinal regions. Moderate size hematoma is noted in the soft tissues anterior to the musculature of the right hip. No definite intraperitoneal hemorrhage is noted. 2. Moderate size fat containing periumbilical hernia. Aortic Atherosclerosis   Echo 6/16 >> normal LVEF  Micro Data:  6/10 SARS 2 >> neg 6/14 MRSA PCR >> neg 6/20 trach asp >> few diptheroids/ corynebacterium species 6/18 BC x 2 >> neg   Antimicrobials:  6/14 cefazolin pre-op  6/20 unasyn >> 09/02/2019  Interim history/subjective:   Remains intubated.  Exam unchanged.  Objective   Blood pressure (!) 159/51, pulse 92, temperature 98.4 F (36.9 C), temperature source Axillary, resp. rate 16, height 5\' 1"  (1.549 m), weight 80.5 kg, SpO2 100 %.    Vent Mode: PSV;CPAP FiO2 (%):  [40 %] 40 % Set Rate:  [18 bmp] 18 bmp Vt Set:  [410 mL]  410 mL PEEP:  [5 cmH20] 5 cmH20 Pressure Support:  [12 cmH20] 12 cmH20 Plateau Pressure:  [18 cmH20-23 cmH20] 23 cmH20   Intake/Output Summary (Last 24 hours) at 09/11/2019 0908 Last data filed at 09/11/2019 0600 Gross per 24 hour  Intake 1568.62 ml  Output 900 ml  Net 668.62  ml   Filed Weights   09/06/19 0500 09/07/19 0500 09/08/19 0500  Weight: 78 kg 80 kg 80.5 kg    Examination: General:  obese female, on Precedex.  Follows commands. HEENT: Endotracheal tube is in place. Neuro: Follows some commands, reported to be hard of hearing, moves right foot greater than right arm left side with hemiplegia CV: Heart sounds are distant PULM: Tolerating mechanical ventilation with no asynchrony.  Rhonchi.  Copious secretions. GI: soft, bsx4 active  GU: Extremities: warm/dry,  edema  Skin: no rashes or lesions   Resolved Hospital Problem list   Hypotension  AKI  Assessment & Plan:   Right MCA CVA due to bilateral intracranial stenoses s/p RT ICA cer/petrous junction balloon angioplasty for severe stenosis. Persistent left-sided weakness. Continue secondary stroke prevention Given high risk of bleeding avoid antiplatelet medications  Critically ill due to acute respiratory failure with hypoxia requiring mechanical ventilation.  Failed trial of extubation due to upper airway stridor.  Cords visibly scarred on reintubation. Continue full ventilatory support with daily SBT awaiting tracheostomy. Chest physiotherapy and bronchodilators for secretions. Tracheostomy once hemoglobin 5-6.  Acute blood loss anemia - Jehovah's Witness: no blood products including albumin Hemoglobin has stabilized at 4.4.  No evidence of active bleeding. Currently on severe anemia protocol of iron vitamin C vitamin B12 folic acid and Epogen Limit blood draws  Daily Goals Checklist  Pain/Anxiety/Delirium protocol (if indicated): Precedex Neuro vitals: every 4 hours hours AED's: None VAP protocol (if indicated): Bundle in place Respiratory support goals: Daily SBT Blood pressure target: Keep systolic blood pressure less than 160 DVT prophylaxis: Mechanical DVT prophylaxis only given anemia and high bleeding risk. Nutrition Status: Tolerating tube feeds. GI prophylaxis:  Pantoprazole Fluid status goals: Allow autoregulation Urinary catheter: External catheter Central lines: PICC line Glucose control: Adequate glycemic control, type 2 diabetes. Mobility/therapy needs: PT OT Antibiotic de-escalation: No antibiotics Home medication reconciliation: On hold Daily labs: CBC daily. Code Status: Full code. Family Communication: Family meeting yesterday.  Goal to proceed with tracheostomy and PEG tube. Disposition: ICU.  Lynnell Catalan, MD [facility] ICU Physician [facility]  Pager: [phone] Mobile: [phone] After hours: [phone].  09/11/2019, 9:14 AM

## 2019-09-12 DIAGNOSIS — I6521 Occlusion and stenosis of right carotid artery: Secondary | ICD-10-CM | POA: Diagnosis not present

## 2019-09-12 DIAGNOSIS — L7622 Postprocedural hemorrhage and hematoma of skin and subcutaneous tissue following other procedure: Secondary | ICD-10-CM | POA: Diagnosis not present

## 2019-09-12 LAB — GLUCOSE, CAPILLARY
Glucose-Capillary: 150 mg/dL — ABNORMAL HIGH (ref 70–99)
Glucose-Capillary: 150 mg/dL — ABNORMAL HIGH (ref 70–99)
Glucose-Capillary: 107 mg/dL — ABNORMAL HIGH (ref 70–99)
Glucose-Capillary: 115 mg/dL — ABNORMAL HIGH (ref 70–99)
Glucose-Capillary: 104 mg/dL — ABNORMAL HIGH (ref 70–99)
Glucose-Capillary: 130 mg/dL — ABNORMAL HIGH (ref 70–99)

## 2019-09-12 NOTE — Progress Notes (Signed)
Referring Physician(s): Melvenia Beam  Supervising Physician: Luanne Bras  Patient Status:  [facility] - In-pt  Chief Complaint: None- intubated with sedation  Subjective:  Right ICA cervical/petrous junction stenosis s/p revascularization using balloon angioplasty via right radial and right femoral approach6/14/2021by Dr. Estanislado Pandy. Mild-moderate right thigh (with some retroperitoneal component) hematoma. Acute blood loss anemia, Jehovah's Witness. Patientintubated withsedation.Sheopens eyes to voiceand follows simple commands. More alert today. Can spontaneously move right sidewith left-sided neglect- no change. Right groin and right radial puncture sites c/d/i. Most recent hgbwas4.4 yesterday(stable from 09/10/2019). No family at bedside this AM.   Allergies: Patient has no known allergies.  Medications: Prior to Admission medications   Medication Sig Start Date End Date Taking? Authorizing Provider  amLODipine (NORVASC) 10 MG tablet Take 1 tablet (10 mg total) by mouth daily. 05/12/19  Yes Minette Brine, FNP  aspirin EC 81 MG tablet Take 81 mg by mouth every evening.   Yes [provider]  Calcium Carbonate-Vitamin D (CALCIUM-D PO) Take 2 tablets by mouth daily at 12 noon.    Yes [provider]  carvedilol (COREG) 6.25 MG tablet TAKE 1 TABLET(6.25 MG) BY MOUTH TWICE DAILY Patient taking differently: Take 6.25 mg by mouth 2 (two) times daily with a meal.  07/31/19  Yes Minette Brine, FNP  hydrALAZINE (APRESOLINE) 50 MG tablet Take 1 tablet (50 mg total) by mouth 2 (two) times daily. 06/16/19 09/14/19 Yes Elouise Munroe, MD  Insulin Degludec-Liraglutide (XULTOPHY) 100-3.6 UNIT-MG/ML SOPN Inject 30 Units into the skin daily. 08/14/19  Yes Minette Brine, FNP  telmisartan-hydrochlorothiazide (MICARDIS HCT) 40-12.5 MG tablet Take 1 tablet by mouth daily. 07/31/19  Yes Minette Brine, FNP  ticagrelor (BRILINTA) 90 MG TABS tablet Take 1 tablet (90 mg  total) by mouth 2 (two) times daily. 06/12/19  Yes Elouise Munroe, MD  vitamin C (ASCORBIC ACID) 250 MG tablet Take 500 mg by mouth daily at 12 noon.   Yes [provider]  blood glucose meter kit and supplies KIT Dispense based on patient and insurance preference. Use up to four times daily as directed. (FOR ICD-9 250.00, 250.01). 05/10/18   Minette Brine, FNP  Blood Glucose Monitoring Suppl (TRUE METRIX METER) w/Device KIT 1 each by Does not apply route in the morning, at noon, in the evening, and at bedtime. 05/22/19   Minette Brine, FNP  Evolocumab (REPATHA SURECLICK) 045 MG/ML SOAJ Inject 140 mg into the skin every 14 (fourteen) days. 06/30/19   Elouise Munroe, MD  glucose blood (TRUE METRIX BLOOD GLUCOSE TEST) test strip Check blood sugar 4 times a day before meals and bedtime 05/22/19   Minette Brine, FNP  Insulin Glargine-Lixisenatide (SOLIQUA) 100-33 UNT-MCG/ML SOPN Inject 25 Units into the skin daily. Patient taking differently: Inject 30 Units into the skin daily.  07/14/19   Minette Brine, FNP  Multiple Vitamin (MULTIVITAMIN PO) Take 1 tablet by mouth daily at 12 noon.     [provider]  Omega-3 Fatty Acids (OMEGA-3 PLUS PO) Take 2 capsules by mouth daily at 12 noon.    [provider]     Vital Signs: BP (!) 154/76   Pulse (!) 101   Temp 98.5 F (36.9 C) (Axillary)   Resp 20   Ht 5' 1"  (1.549 m)   Wt 190 lb 14.7 oz (86.6 kg)   SpO2 100%   BMI 36.07 kg/m   Physical Exam Vitals and nursing note reviewed.  Constitutional:      General: She  is not in acute distress.    Comments: Intubated with sedation.   Pulmonary:     Effort: Pulmonary effort is normal. No respiratory distress.     Comments: Intubated with sedation.  Skin:    General: Skin is warm and dry.     Comments: Right groin puncture site soft without active bleeding or hematoma. Right radial puncture site soft without active bleeding or hematoma.  Neurological:     Comments:  Intubated with sedation. She opens eyes to voice and follows simple commands. More alert today. PERRL bilaterally. Can spontaneously move right side but no spontaneous movements of left side (left-sided neglect). Distal pulses (DPs) 1+ bilaterally and (radial) 2+ bilaterally.      Imaging: MR BRAIN WO CONTRAST  Result Date: 09/09/2019 CLINICAL DATA:  69 year old female status post right ICA angioplasty with scattered small infarcts mostly in the right hemisphere on 08/26/2019. Acute blood loss anemia. Subsequent encounter. EXAM: MRI HEAD WITHOUT CONTRAST TECHNIQUE: Multiplanar, multiecho pulse sequences of the brain and surrounding structures were obtained without intravenous contrast. COMPARISON:  Brain MRI 08/26/2019. FINDINGS: Brain: Substantially increased size and confluence of multifocal restricted diffusion in the right cerebral hemisphere since 08/26/2019. Confluent involvement of the posterior right MCA and the superior right PCA territory white matter (series 5, image 84). This progression is relatively spared the cortex. And there is associated confluent T2 and FLAIR hyperintensity with evidence of petechial blood products (right occipital white matter series 14, image 31), but no malignant hemorrhagic transformation. There are also several new small foci of restricted diffusion elsewhere including the anterior left frontal lobe (also on image 84), left basal ganglia (series 5, image 78), right cerebellum, and brainstem including the left medullary pyramid on series 5, image 61. Superimposed chronic right basal ganglia infarct with hemosiderin and ex vacuo enlargement of the right lateral ventricle. Chronic left lateral cerebellar infarct is stable. No midline shift, mass effect, evidence of mass lesion, ventriculomegaly, extra-axial collection. Cervicomedullary junction and pituitary are within normal limits. Vascular: Major intracranial vascular flow voids are stable. Skull and upper cervical  spine: Decreased T1 marrow signal in the skull and cervical spine might indicate red marrow reactivation. Sinuses/Orbits: Negative orbits. Paranasal sinus fluid and mucosal thickening not significantly changed. Other: Mastoids remain well pneumatized. Small volume retained secretions now in the nasopharynx. IMPRESSION: 1. Progressed size and confluence of ischemia throughout much of the right hemisphere white matter since 08/26/2019. Associated petechial blood products but no malignant hemorrhagic transformation or mass effect. 2. There are also multiple new lacunar type infarcts elsewhere, including the brainstem and the left basal ganglia. 3. Underlying chronic infarcts of the right basal ganglia and left cerebellum. 4. Suspect interval red marrow reactivation in the skull and cervical spine. Electronically Signed   By: Genevie Ann M.D.   On: 09/09/2019 15:19   DG CHEST PORT 1 VIEW  Result Date: 09/08/2019 CLINICAL DATA:  Hypoxia EXAM: PORTABLE CHEST 1 VIEW COMPARISON:  September 03, 2019 FINDINGS: Endotracheal tube tip is 3.8 cm above the carina. Central catheter tip is in the superior vena cava. Feeding tube tip is below the diaphragm. No pneumothorax. There is atelectatic change in the left mid lung and lung base regions. No edema or airspace opacity. Heart size and pulmonary vascularity are normal. There is aortic atherosclerosis. No adenopathy. Note that stomach is mildly distended with air. IMPRESSION: Tube and catheter positions as described without pneumothorax. Areas of mild atelectasis without edema or airspace opacity. Cardiac silhouette normal. Aortic Atherosclerosis (ICD10-I70.0).  Stomach mildly distended with air. Electronically Signed   By: Lowella Grip III M.D.   On: 09/08/2019 14:41    Labs:  CBC: Recent Labs    09/07/19 0512 09/09/19 0525 09/10/19 1134 09/11/19 0412  WBC 8.4 6.5 6.9 7.0  HGB 4.5* 4.3* 4.4* 4.4*  HCT 15.7* 15.6* 16.3* 16.3*  PLT 175 239 274 304    COAGS: Recent  Labs    11/27/18 0728 08/18/19 0705 08/26/19 1715 08/27/19 0230  INR 0.9 0.9 1.1 1.2    BMP: Recent Labs    09/05/19 0618 09/07/19 0512 09/09/19 0525 09/11/19 0412  NA 150* 144 146* 146*  K 3.6 3.4* 3.8 4.0  CL 110 108 110 111  CO2 31 28 29 30   GLUCOSE 290* 148* 170* 214*  BUN 27* 23 24* 21  CALCIUM 8.6* 8.1* 8.4* 8.5*  CREATININE 0.75 0.76 0.76 0.69  GFRNONAA >60 >60 >60 >60  GFRAA >60 >60 >60 >60    LIVER FUNCTION TESTS: Recent Labs    09/05/19 0618 09/07/19 0512 09/09/19 0525 09/11/19 0412  BILITOT 1.0 0.8 0.4 0.7  AST 26 34 28 23  ALT 21 28 29 28   ALKPHOS 56 46 49 51  PROT 5.8* 5.5* 5.8* 6.0*  ALBUMIN 2.0* 1.8* 1.7* 1.7*    Assessment and Plan:  Right ICA cervical/petrous junction stenosis s/p revascularization using balloon angioplasty via right radial and right femoral approach6/14/2021by Dr. Estanislado Pandy. Mild-moderate right thigh (with some retroperitoneal component) hematoma. Acute blood loss anemia, Jehovah's Witness. Patient's condition stable- more alert today but remainsintubated with sedation(weaning),opens eyes to voice and follows simple commands, has spontaneous movements of right side but no spontaneous movements of left side. Right groinand right radial puncture sitesstable, no signs of active bleeding. Plans for tracheostomy in future- awaiting hgb 5-6 range for this per sons request, hgb currently stable at 4.4. Continue to hold Brilinta/Aspirin/Lovenox.Agree with CCM plans to avoid excess blood drawl. Further plans per CCM/neurology/palliative care- appreciate and agree with management. NIR to follow.   Electronically Signed: Earley Abide, PA-C 09/12/2019, 10:04 AM   I spent a total of 25 Minutes at the the patient's bedside AND on the patient's hospital floor or unit, greater than 50% of which was counseling/coordinating care for right ICA stenosis s/p revascularization.

## 2019-09-12 NOTE — Progress Notes (Addendum)
NAME:  Donna [name], MRN:  [id number], DOB:  [birth year], LOS: 18 ADMISSION DATE:  08/25/2019, CONSULTATION DATE:  08/27/2019 REFERRING MD:  Dr. 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  Was placed on ASA/ brillinta.  Monitored in Neuro ICU.  Noted to have developed hypotension, increased left sided weakness and lethargy on 6/15 am with Hgb drop from 13.3 to 6.9 with new AKI.  Post MRI/ MRA showed scattered small acute infarcts along the right MCA/ watershed territory suspected to be resultant either post procedure vs hypoperfusion.  Neurology was consulted with recommendations to keep SBP > 140 for cerebral perfusion.  A CT abd/ pelvis was obtained given concern for retroperitoneal bleed which showed a mild to moderate amount of retroperitoneal hematoma in the right iliac and inguinal regiones with moderate size hematoma noted the the soft tissues anterior to the musculature of the right hip; no definite intrapertioneal hemorrhage. INR, PT, and platelets rechecked and were wnl.  Vascular surgery was consulted with plans to observe given bleeding was mostly into the muscle and tissue.  She was started on neosynephrine to keep SBP goal > 140-160 for cerebral perfusion.  PCCM consulted to help with vasopressor support.  S/p 1L bolus  Repeat Hgb now 6.9-> 7-> 5.1.   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    ETT 6/19 > 6/23,  6/23 >  Significant Diagnostic Tests:   6/15 MRA/ MRI brain  > Scattered small acute infarcts along the right MCA/watershed territory. Solitary small acute infarct in the left parietal cortex and right cerebellum. Improved right ICA patency at the petrous segment. Known severe right M1 segment stenosis. 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  6/15 CT a/p wo contrast > Mild to moderate amount of retroperitoneal hematoma is noted in the right iliac and inguinal regions. Moderate size hematoma is noted in the soft tissues anterior to the musculature of the right hip. No definite intraperitoneal hemorrhage is noted. Moderate size fat containing periumbilical hernia. Aortic Atherosclerosis.   Echo 6/16 > normal LVEF  Micro Data:  6/10 SARS 2 >> neg 6/14 MRSA PCR >> neg 6/20 trach asp >> few diptheroids/ corynebacterium species 6/18 BC x 2 >> neg   Antimicrobials:  6/14 cefazolin pre-op  6/20 unasyn >> 09/02/2019  Interim history/subjective:  No acute events overnight. On PSV wean.   Objective   Blood pressure (!) 143/58, pulse 82, temperature 98.8 F (37.1 C), temperature source Oral, resp. rate 20, height 5\' 1"  (1.549 m), weight 86.6 kg, SpO2 100 %.    Vent Mode: PRVC FiO2 (%):  [40 %] 40 % Set Rate:  [18 bmp] 18 bmp Vt Set:  [410 mL] 410 mL  PEEP:  [5 cmH20] 5 cmH20 Pressure Support:  [12 cmH20] 12 cmH20 Plateau Pressure:  [14 cmH20] 14 cmH20   Intake/Output Summary (Last 24 hours) at 09/12/2019 0740 Last data filed at 09/12/2019 0600 Gross per 24 hour  Intake 1807.69 ml  Output 1000 ml  Net 807.69 ml   Filed Weights   09/07/19 0500  09/08/19 0500 09/12/19 0500  Weight: 80 kg 80.5 kg 86.6 kg    Examination:  General:  Obese female in NAD on vent.  HEENT: NCAT, PERRL, no JVD. ETT in place.  Neuro: Follows commands on the R. 4/5 RUE strength. No movement to command on L.  CV: RRR, no MRG PULM: Tolerating PSV for now. Rhonchi.  GI: soft, bsx4 active  VW:[id number] catheter Extremities: warm/dry,  Trace edema.  Skin: Grossly intact.    Resolved Hospital Problem list   Hypotension  AKI  Assessment & Plan:   Right MCA CVA due to bilateral intracranial stenoses  s/p RT ICA cer/petrous junction balloon angioplasty for severe stenosis c/b right thigh and retroperitoneal hematoma.  Persistent left-sided weakness. Continue secondary stroke prevention Avoiding anticoagulation and antiplatelets considering high bleeding risk and low hemoglobin.   Critically ill due to acute respiratory failure with hypoxia requiring mechanical ventilation.  Failed trial of extubation due to upper airway stridor.  Cords visibly scarred on reintubation. Full vent support with daily SBT awaiting tracheostomy. Continue chest PT Hopefully hemoglobin will improve to allow for tracheostomy. 5-6 range.   Acute blood loss anemia - Jehovah's Witness: no blood products including albumin Hemoglobin has stabilized at 4.4.  No evidence of active bleeding. Currently on severe anemia protocol of iron vitamin C vitamin B12 folic acid and Epogen Limit blood draws No labs today to limit blood draws. Will repeat hemoglobin 7/3.  Daily Goals Checklist  Pain/Anxiety/Delirium protocol (if indicated): Precedex Neuro vitals: q 4 hours AED's: None VAP protocol (if indicated): Bundle in place Respiratory support goals: Daily SBT Blood pressure target: Keep systolic blood pressure less than 160 DVT prophylaxis: Mechanical DVT prophylaxis only given anemia and high bleeding risk. Nutrition Status: Tolerating tube feeds GI prophylaxis: Pantoprazole Fluid  status goals: Allow autoregulation Urinary catheter: External catheter Central lines: PICC line Glucose control: Adequate glycemic control, type 2 diabetes. Mobility/therapy needs: PT OT Antibiotic de-escalation: No antibiotics Home medication reconciliation: On hold Daily labs: CBC daily. Code Status: Full code. Family Communication: Son Evaristo Bury updated at length regarding plan of care. Still wants to proceed with trach, but is perhaps open to other options after discussing her care with his aunt (the patient's sister) regarding the patient's suffering and desire to be off the ventilator.  Disposition: ICU.   Joneen Roach, AGACNP-BC [facility]  See Amion for personal pager PCCM on call pager [phone]  09/12/2019 7:43 AM

## 2019-09-12 NOTE — Progress Notes (Signed)
   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   Palliative care was asked to aid in goals of care conversations in the setting of patient having failed weaning trials from ventilatory support.  Today's Discussion (09/12/2019): Chart reviewed. I met with Huntley at bedside. She remains intubated and sedated. She is able to open her eyes and does appear to recognize my presence.  I called patients son, [name] to offer support this afternoon. I asked him if there were any additional questions that he or his family may have that I can help answer. He stated that his aunt had called him and there is some concern that the patient may be suffering.   [name] asked if a tracheostomy could be placed with her Hgb of 4.4. I kindly told him that I do not do these procedures therefore am not appropriate to answer this question. I shared that no matter what we do which is invasive there is always a risk of some blood loss.   I strongly encouraged [name] at this juncture to consider DNR code status given all that his mother has been through so far.  We talked about the two paths we could travel, either continuing with current interventions and potential tracheostomy + g-tube or transitioning focus to comfort. [name] said "and let her die?" I answered yes, we would allow her to pass away peacefully without pain.   [name] laments on the  difficulty of all of this given that he had hoped she could spend the rest of her days without worry. He vocalized that he has been talking to his brother and aunt regularly and he is trying to process all of this. I shared that we plan to offer ongoing support as nothing about these situations is easy.   Discussed the importance of continued conversation with family and their  medical providers regarding overall plan of care and treatment options, ensuring decisions are within the context of the patients values and GOCs.  Questions and concerns addressed   Decision Maker: Jene Huq (Son) [phone]  SUMMARY OF RECOMMENDATIONS Full Code, full scope of care for the time being --> strongly recommended consideration of DNR  Discussed options of continuing current level of care or transitioning towards more of a comfort emphasis  Ongoing PMT support  Chaplain to provide ongoing JW support  Time Spent: 35 Greater than 50% of the time was spent in counseling and coordination of care ______________________________________________________________________________________ [facility] Team Team Cell Phone: [phone] Please utilize secure chat with additional questions, if there is no response within 30 minutes please call the above phone number  Palliative Medicine Team providers are available by phone from 7am to 7pm daily and can be reached through the team cell phone.  Should this patient require assistance outside of these hours, please call the patient's attending physician.

## 2019-09-13 ENCOUNTER — Inpatient Hospital Stay (HOSPITAL_COMMUNITY): Payer: Medicare HMO

## 2019-09-13 LAB — CBC
HCT: 15.4 % — ABNORMAL LOW (ref 36.0–46.0)
Hemoglobin: 4.3 g/dL — CL (ref 12.0–15.0)
MCH: 31.6 pg (ref 26.0–34.0)
MCHC: 27.9 g/dL — ABNORMAL LOW (ref 30.0–36.0)
MCV: 113.2 fL — ABNORMAL HIGH (ref 80.0–100.0)
Platelets: 366 10*3/uL (ref 150–400)
RBC: 1.36 MIL/uL — ABNORMAL LOW (ref 3.87–5.11)
RDW: 20.8 % — ABNORMAL HIGH (ref 11.5–15.5)
WBC: 6.2 10*3/uL (ref 4.0–10.5)
nRBC: 5.3 % — ABNORMAL HIGH (ref 0.0–0.2)

## 2019-09-13 LAB — COMPREHENSIVE METABOLIC PANEL
ALT: 24 U/L (ref 0–44)
AST: 23 U/L (ref 15–41)
Albumin: 1.7 g/dL — ABNORMAL LOW (ref 3.5–5.0)
Alkaline Phosphatase: 55 U/L (ref 38–126)
Anion gap: 8 (ref 5–15)
BUN: 24 mg/dL — ABNORMAL HIGH (ref 8–23)
CO2: 28 mmol/L (ref 22–32)
Calcium: 8.6 mg/dL — ABNORMAL LOW (ref 8.9–10.3)
Chloride: 111 mmol/L (ref 98–111)
Creatinine, Ser: 0.73 mg/dL (ref 0.44–1.00)
GFR calc Af Amer: >60 mL/min (ref >60)
GFR calc non Af Amer: >60 mL/min (ref >60)
Glucose, Bld: 118 mg/dL — ABNORMAL HIGH (ref 70–99)
Potassium: 4.1 mmol/L (ref 3.5–5.1)
Sodium: 147 mmol/L — ABNORMAL HIGH (ref 135–145)
Total Bilirubin: 0.6 mg/dL (ref 0.3–1.2)
Total Protein: 5.8 g/dL — ABNORMAL LOW (ref 6.5–8.1)

## 2019-09-13 LAB — GLUCOSE, CAPILLARY
Glucose-Capillary: 100 mg/dL — ABNORMAL HIGH (ref 70–99)
Glucose-Capillary: 141 mg/dL — ABNORMAL HIGH (ref 70–99)
Glucose-Capillary: 185 mg/dL — ABNORMAL HIGH (ref 70–99)
Glucose-Capillary: 65 mg/dL — ABNORMAL LOW (ref 70–99)
Glucose-Capillary: 102 mg/dL — ABNORMAL HIGH (ref 70–99)
Glucose-Capillary: 183 mg/dL — ABNORMAL HIGH (ref 70–99)

## 2019-09-13 MED ORDER — FENTANYL CITRATE (PF) 100 MCG/2ML IJ SOLN
200.0000 ug | Freq: Once | INTRAMUSCULAR | Status: AC
  Administered 2019-09-13 (×2): 100 ug via INTRAVENOUS
  Filled 2019-09-13: qty 4

## 2019-09-13 MED ORDER — FREE WATER
100.0000 mL | Status: DC
  Administered 2019-09-13 – 2019-09-14 (×6): 100 mL

## 2019-09-13 MED ORDER — MIDAZOLAM HCL 2 MG/2ML IJ SOLN
5.0000 mg | Freq: Once | INTRAMUSCULAR | Status: AC
  Administered 2019-09-13 (×2): 2 mg via INTRAVENOUS
  Filled 2019-09-13: qty 6

## 2019-09-13 MED ORDER — VECURONIUM BROMIDE 10 MG IV SOLR
10.0000 mg | Freq: Once | INTRAVENOUS | Status: AC
  Administered 2019-09-13: 10 mg via INTRAVENOUS
  Filled 2019-09-13: qty 10

## 2019-09-13 MED ORDER — ETOMIDATE 2 MG/ML IV SOLN
40.0000 mg | Freq: Once | INTRAVENOUS | Status: AC
  Administered 2019-09-13: 20 mg via INTRAVENOUS
  Filled 2019-09-13: qty 20

## 2019-09-13 MED ORDER — DEXTROSE 50 % IV SOLN
INTRAVENOUS | Status: AC
  Administered 2019-09-13: 25 mL
  Filled 2019-09-13: qty 50

## 2019-09-13 NOTE — Procedures (Signed)
Diagnostic Bronchoscopy  Donna [name]  [id number]  25-Jan-1951  Date:09/13/19  Time:4:18 PM   Provider Performing:Jonavon Trieu F Earlene Plater   Procedure: Diagnostic Bronchoscopy [phone])  Indication(s) Assist with direct visualization of tracheostomy placement   Consent Risks of the procedure as well as the alternatives and risks of each were explained to the patient and/or caregiver.  Consent for the procedure was obtained.   Anesthesia See separate tracheostomy note   Time Out Verified patient identification, verified procedure, site/side was marked, verified correct patient position, special equipment/implants available, medications/allergies/relevant history reviewed, required imaging and test results available.   Sterile Technique Usual hand hygiene, masks, gowns, and gloves were used   Procedure Description Bronchoscope advanced through endotracheal tube and into airway.  After suctioning out tracheal secretions, bronchoscope used to provide direct visualization of tracheostomy placement.   Complications/Tolerance None; patient tolerated the procedure well.   EBL None  Specimen(s) None  Delfin Gant, NP-C [facility] Contact / Pager information can be found on Amion  09/13/2019, 4:19 PM

## 2019-09-13 NOTE — Progress Notes (Signed)
  Referring Physician(s): Ahern,A  Supervising Physician: [name], Sanjeev  Patient Status:  [facility] - In-pt  Chief Complaint: stroke   Subjective: Right ICA cervical/petrous junction stenosis s/p revascularization using balloon angioplasty via right radial and right femoral approach6/14/2021by Dr. [name]. Mild-moderate right thigh (with some retroperitoneal component) hematoma. Acute blood loss anemia, Jehovah's Witness  Remains intubated; eyes open; follows commands; responds to voice; pupils 2 mm /equal; purposeful movement on right, not left; son in room  For trach today; hgb remains low at 4.3  Allergies: Patient has no known allergies.  Medications: Prior to Admission medications   Medication Sig Start Date End Date Taking? Authorizing Provider  amLODipine (NORVASC) 10 MG tablet Take 1 tablet (10 mg total) by mouth daily. 05/12/19  Yes Moore, [name], FNP  aspirin EC 81 MG tablet Take 81 mg by mouth every evening.   Yes [provider]  Calcium Carbonate-Vitamin D (CALCIUM-D PO) Take 2 tablets by mouth daily at 12 noon.    Yes [provider]  carvedilol (COREG) 6.25 MG tablet TAKE 1 TABLET(6.25 MG) BY MOUTH TWICE DAILY Patient taking differently: Take 6.25 mg by mouth 2 (two) times daily with a meal.  07/31/19  Yes Moore, [name], FNP  hydrALAZINE (APRESOLINE) 50 MG tablet Take 1 tablet (50 mg total) by mouth 2 (two) times daily. 06/16/19 09/14/19 Yes Acharya, Gayatri A, MD  Insulin Degludec-Liraglutide (XULTOPHY) 100-3.6 UNIT-MG/ML SOPN Inject 30 Units into the skin daily. 08/14/19  Yes Moore, [name], FNP  telmisartan-hydrochlorothiazide (MICARDIS HCT) 40-12.5 MG tablet Take 1 tablet by mouth daily. 07/31/19  Yes Moore, [name], FNP  ticagrelor (BRILINTA) 90 MG TABS tablet Take 1 tablet (90 mg total) by mouth 2 (two) times daily. 06/12/19  Yes Acharya, Gayatri A, MD  vitamin C (ASCORBIC ACID) 250 MG tablet Take 500 mg by mouth daily at 12 noon.   Yes [provider]  blood glucose meter kit and supplies KIT Dispense based on patient and insurance preference. Use up to four times daily as directed. (FOR ICD-9 250.00, 250.01). 05/10/18   Moore, [name], FNP  Blood Glucose Monitoring Suppl (TRUE METRIX METER) w/Device KIT 1 each by Does not apply route in the morning, at noon, in the evening, and at bedtime. 05/22/19   Moore, [name], FNP  Evolocumab (REPATHA SURECLICK) 140 MG/ML SOAJ Inject 140 mg into the skin every 14 (fourteen) days. 06/30/19   Acharya, Gayatri A, MD  glucose blood (TRUE METRIX BLOOD GLUCOSE TEST) test strip Check blood sugar 4 times a day before meals and bedtime 05/22/19   Moore, [name], FNP  Insulin Glargine-Lixisenatide (SOLIQUA) 100-33 UNT-MCG/ML SOPN Inject 25 Units into the skin daily. Patient taking differently: Inject 30 Units into the skin daily.  07/14/19   Moore, [name], FNP  Multiple Vitamin (MULTIVITAMIN PO) Take 1 tablet by mouth daily at 12 noon.     [provider]  Omega-3 Fatty Acids (OMEGA-3 PLUS PO) Take 2 capsules by mouth daily at 12 noon.    [provider]     Vital Signs: BP (!) 150/69   Pulse 83   Temp 98.9 F (37.2 C) (Axillary)   Resp 17   Ht 5' 1" (1.549 m)   Wt 190 lb 11.2 oz (86.5 kg)   SpO2 100%   BMI 36.03 kg/m   Physical Exam intubated; eyes open; follows commands; responds to voice; pupils 2 mm /equal; purposeful movement on right, not left  Imaging: No results found.  Labs:  CBC: Recent Labs      09/09/19 0525 09/10/19 1134 09/11/19 0412 09/13/19 0500  WBC 6.5 6.9 7.0 6.2  HGB 4.3* 4.4* 4.4* 4.3*  HCT 15.6* 16.3* 16.3* 15.4*  PLT 239 274 304 366    COAGS: Recent Labs    11/27/18 0728 08/18/19 0705 08/26/19 1715 08/27/19 0230  INR 0.9 0.9 1.1 1.2    BMP: Recent Labs    09/07/19 0512 09/09/19 0525 09/11/19 0412 09/13/19 0500  NA 144 146* 146* 147*  K 3.4* 3.8 4.0 4.1  CL 108 110 111 111  CO2 28 29 30 28  GLUCOSE 148* 170* 214* 118*  BUN  23 24* 21 24*  CALCIUM 8.1* 8.4* 8.5* 8.6*  CREATININE 0.76 0.76 0.69 0.73  GFRNONAA >60 >60 >60 >60  GFRAA >60 >60 >60 >60    LIVER FUNCTION TESTS: Recent Labs    09/07/19 0512 09/09/19 0525 09/11/19 0412 09/13/19 0500  BILITOT 0.8 0.4 0.7 0.6  AST 34 28 23 23  ALT 28 29 28 24  ALKPHOS 46 49 51 55  PROT 5.5* 5.8* 6.0* 5.8*  ALBUMIN 1.8* 1.7* 1.7* 1.7*    Assessment and Plan: Right ICA cervical/petrous junction stenosis s/p revascularization using balloon angioplasty via right radial and right femoral approach6/14/2021by Dr. [name].Rt MCA CVA/left lacunar infarcts Mild-moderate right thigh (with some retroperitoneal component) hematoma. Acute blood loss anemia, Jehovah's Witness  Hgb 4.3 today; for trach today per CCM; possible G tube at some point depending upon stability of hgb; antiplatelets held   Electronically Signed: D. Kevin , PA-C 09/13/2019, 3:49 PM   I spent a total of 15 minutes at the the patient's bedside AND on the patient's hospital floor or unit, greater than 50% of which was counseling/coordinating care for right ICA stenosis s/p revascularization    Patient ID: Donna [name], female   DOB: 03/09/1951, 69 y.o.   MRN: [id number]  

## 2019-09-13 NOTE — Procedures (Signed)
Percutaneous Tracheostomy Procedure Note   Donna [name]  [id number]  1950/10/17  Date:09/13/19  Time:4:20 PM   Provider Performing:Gokul Waybright C Katrinka Blazing  Procedure: Percutaneous Tracheostomy with Bronchoscopic Guidance ([id number])  Indication(s) Persistent Respiratory Failure  Consent Risks of the procedure as well as the alternatives and risks of each were explained to the patient and/or caregiver.  Consent for the procedure was obtained.  Anesthesia Etomidate, Versed, Fentanyl, Vecuronium   Time Out Verified patient identification, verified procedure, site/side was marked, verified correct patient position, special equipment/implants available, medications/allergies/relevant history reviewed, required imaging and test results available.   Sterile Technique Maximal sterile technique including sterile barrier drape, hand hygiene, sterile gown, sterile gloves, mask, hair covering.    Procedure Description Appropriate anatomy identified by palpation.  Patient's neck prepped and draped in sterile fashion.  1% lidocaine with epinephrine was used to anesthetize skin overlying neck.  1.5cm incision made and blunt dissection performed until tracheal rings could be easily palpated.   Then a size 6-0 Shiley tracheostomy was placed under bronchoscopic visualization using usual Seldinger technique and serial dilation.   Bronchoscope confirmed placement above the carina.  Tracheostomy was sutured in place with adhesive pad to protect skin under pressure.    Patient connected to ventilator.   Complications/Tolerance None; patient tolerated the procedure well. Chest X-ray is ordered to confirm no post-procedural complication.   EBL Minimal   Specimen(s) None

## 2019-09-13 NOTE — Progress Notes (Signed)
NAME:  Donna [name], MRN:  [id number], DOB:  [birth year], LOS: 19 ADMISSION DATE:  08/25/2019, CONSULTATION DATE:  08/27/2019 REFERRING MD:  Dr. 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  Was placed on ASA/ brillinta.  Monitored in Neuro ICU.  Noted to have developed hypotension, increased left sided weakness and lethargy on 6/15 am with Hgb drop from 13.3 to 6.9 with new AKI.  Post MRI/ MRA showed scattered small acute infarcts along the right MCA/ watershed territory suspected to be resultant either post procedure vs hypoperfusion.  Neurology was consulted with recommendations to keep SBP > 140 for cerebral perfusion.  A CT abd/ pelvis was obtained given concern for retroperitoneal bleed which showed a mild to moderate amount of retroperitoneal hematoma in the right iliac and inguinal regiones with moderate size hematoma noted the the soft tissues anterior to the musculature of the right hip; no definite intrapertioneal hemorrhage. INR, PT, and platelets rechecked and were wnl.  Vascular surgery was consulted with plans to observe given bleeding was mostly into the muscle and tissue.  She was started on neosynephrine to keep SBP goal > 140-160 for cerebral perfusion.  PCCM consulted to help with vasopressor support.  S/p 1L bolus  Repeat Hgb now 6.9-> 7-> 5.1.   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    ETT 6/19 > 6/23,  6/23 >  Significant Diagnostic Tests:   6/15 MRA/ MRI brain  > Scattered small acute infarcts along the right MCA/watershed territory. Solitary small acute infarct in the left parietal cortex and right cerebellum. Improved right ICA patency at the petrous segment. Known severe right M1 segment stenosis. 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  6/15 CT a/p wo contrast > Mild to moderate amount of retroperitoneal hematoma is noted in the right iliac and inguinal regions. Moderate size hematoma is noted in the soft tissues anterior to the musculature of the right hip. No definite intraperitoneal hemorrhage is noted. Moderate size fat containing periumbilical hernia. Aortic Atherosclerosis.   Echo 6/16 > normal LVEF  6/29 MRI brain > Progressed size and confluence of ischemia throughout much of the right hemisphere white matter since 08/26/2019. Associated petechial blood products but no malignant hemorrhagic transformation or mass Effect. There are also multiple new lacunar type infarcts elsewhere, including the brainstem and the left basal ganglia. Underlying chronic infarcts of the right basal ganglia and left cerebellum.  Micro Data:  6/10 SARS 2 >> neg 6/14 MRSA PCR >> neg 6/20 trach asp >> few diptheroids/ corynebacterium species 6/18 BC x 2 >> neg   Antimicrobials:  6/14 cefazolin pre-op  6/20 unasyn >> 09/02/2019  Interim history/subjective:  Patient seen lying in bed with  eyes spontaneously open T-max of 101.9 overnight Remains 11 L positive  Objective   Blood pressure (!) 115/47, pulse 72, temperature 98.7 F (37.1 C), temperature  source Oral, resp. rate 18, height 5\' 1"  (1.549 m), weight 86.6 kg, SpO2 100 %.    Vent Mode: PRVC FiO2 (%):  [40 %] 40 % Set Rate:  [18 bmp] 18 bmp Vt Set:  [410 mL] 410 mL PEEP:  [5 cmH20] 5 cmH20 Pressure Support:  [10 cmH20-12 cmH20] 10 cmH20 Plateau Pressure:  [22 cmH20] 22 cmH20   Intake/Output Summary (Last 24 hours) at 09/13/2019 11/14/2019 Last data filed at 09/13/2019 0600 Gross per 24 hour  Intake 1745.28 ml  Output 1025 ml  Net 720.28 ml   Filed Weights   09/08/19 0500 09/12/19 0500 09/13/19 0500  Weight: 80.5 kg 86.6 kg 86.6 kg    Examination:  General: Chronically ill appearing deconditioned elderly female lying in bed on mechanical ventilation, in NAD HEENT: ETT, MM pink/moist, PERRL, sclera nonicteric Neuro: Seen with spontaneous eye opening but does not appear to track or follow any commands CV: s1s2 regular rate and rhythm, no murmur, rubs, or gallops,  PULM: Bilateral rhonchi, tolerating vent well, no increased work of breathing GI: soft, bowel sounds active in all 4 quadrants, non-tender, non-distended, tolerating tube feeds Extremities: warm/dry, generalized nonpitting edema  Skin: no rashes or lesions  Resolved Hospital Problem list   Hypotension  AKI  Assessment & Plan:   Right MCA CVA due to bilateral intracranial stenoses  -s/p RT ICA cer/petrous junction balloon angioplasty for severe stenosis c/b right thigh and retroperitoneal hematoma.  Persistent left-sided weakness. P: Management per neurology  Maintain neuro protective measures Nutrition and bowel regiment  Seizure precautions  Continue secondary stroke prevention measures Avoid anticoagulation or antiplatelets given high risk for bleed   Acute respiratory failure with hypoxia requiring mechanical ventilation.  -Failed trial of extubation due to upper airway stridor.  Cords visibly scarred on reintubation. P: Continue ventilator support with lung protective strategies  Awaiting stabilization  of hemoglobin/hematocrit prior to tracheostomy placement Continue chest PT Head of bed elevated 30 degrees. Plateau pressures less than 30 cm H20.  Follow intermittent chest x-ray and ABG.   SAT/SBT as tolerated, mentation preclude extubation  Ensure adequate pulmonary hygiene  Follow cultures  VAP bundle in place  PAD protocol  Acute blood loss anemia  - Jehovah's Witness, no blood products including albumin. Hemoglobin has stabilized in the 4 range.  No evidence of active bleeding. P: Continue on severe anemia protocol including: Iron, vitamin C, vitamin B12, folic acid, and Epogen Limit blood draws Supportive care  Daily Goals Checklist  Pain/Anxiety/Delirium protocol (if indicated): Precedex VAP protocol (if indicated): Bundle in place Respiratory support goals: Daily SBT Blood pressure target: Keep systolic blood pressure less than 160 DVT prophylaxis: Mechanical DVT prophylaxis only given anemia and high bleeding risk. Nutrition Status: Tolerating tube feeds GI prophylaxis: Pantoprazole Fluid status goals: Allow autoregulation Urinary catheter: External catheter Central lines: PICC line Glucose control: Adequate glycemic control, type 2 diabetes. Mobility/therapy needs: PT OT Antibiotic de-escalation: No antibiotics Home medication reconciliation: On hold Daily labs: CBC daily. Code Status: Full code. Family Communication: Son 11/14/19 updated at length regarding plan of care. Still wants to proceed with trach, but is perhaps open to other options after discussing her care with his aunt (the patient's sister) regarding the patient's suffering and desire to be off the ventilator.  Disposition: ICU.  Labs   CBC: Recent Labs  Lab 09/07/19 0512 09/09/19 0525 09/10/19 1134 09/11/19 0412 09/13/19 0500  WBC 8.4 6.5 6.9 7.0 6.2  HGB 4.5* 4.3* 4.4* 4.4* 4.3*  HCT 15.7* 15.6* 16.3* 16.3* 15.4*  MCV 105.4* 109.9* 110.1* 110.1* 113.2*  PLT 175 239 274 304 366    Basic  Metabolic Panel: Recent Labs  Lab 09/07/19 0512 09/09/19 0525 09/11/19 0412 09/13/19 0500  NA 144 146* 146* 147*  K 3.4* 3.8 4.0 4.1  CL 108 110 111 111  CO2 28 29 30 28   GLUCOSE 148* 170* 214* 118*  BUN 23 24* 21 24*  CREATININE 0.76 0.76 0.69 0.73  CALCIUM 8.1* 8.4* 8.5* 8.6*   GFR: Estimated Creatinine Clearance: 67.3 mL/min (by C-G formula based on SCr of 0.73 mg/dL). Recent Labs  Lab 09/09/19 0525 09/10/19 1134 09/11/19 0412 09/13/19 0500  WBC 6.5 6.9 7.0 6.2    Liver Function Tests: Recent Labs  Lab 09/07/19 0512 09/09/19 0525 09/11/19 0412 09/13/19 0500  AST 34 28 23 23   ALT 28 29 28 24   ALKPHOS 46 49 51 55  BILITOT 0.8 0.4 0.7 0.6  PROT 5.5* 5.8* 6.0* 5.8*  ALBUMIN 1.8* 1.7* 1.7* 1.7*   No results for input(s): LIPASE, AMYLASE in the last 168 hours. No results for input(s): AMMONIA in the last 168 hours.  ABG    Component Value Date/Time   PHART 7.407 09/03/2019 2106   PCO2ART 60.9 (H) 09/03/2019 2106   PO2ART 87 09/03/2019 2106   HCO3 38.0 (H) 09/03/2019 2106   TCO2 40 (H) 09/03/2019 2106   ACIDBASEDEF 1.0 08/28/2019 0505   O2SAT 96.0 09/03/2019 2106     Coagulation Profile: No results for input(s): INR, PROTIME in the last 168 hours.  Cardiac Enzymes: No results for input(s): CKTOTAL, CKMB, CKMBINDEX, TROPONINI in the last 168 hours.  HbA1C: Hemoglobin A1C  Date/Time Value Ref Range Status  10/12/2017 12:00 AM 9.7  Final   Hgb A1c MFr Bld  Date/Time Value Ref Range Status  05/22/2019 01:12 PM 11.2 (H) 4.8 - 5.6 % Final    Comment:             Prediabetes: 5.7 - 6.4          Diabetes: >6.4          Glycemic control for adults with diabetes: <7.0   11/27/2018 07:27 AM 11.3 (H) 4.8 - 5.6 % Final    Comment:    (NOTE)         Prediabetes: 5.7 - 6.4         Diabetes: >6.4         Glycemic control for adults with diabetes: <7.0     CBG: Recent Labs  Lab 09/12/19 1107 09/12/19 1500 09/12/19 1911 09/12/19 2308 09/13/19 0339   GLUCAP 107* 115* 104* 130* 100*    Critical care time:    Performed by: Delfin GantWhitney F Kafi Dotter  Total critical care time: 38 minutes  Critical care time was exclusive of separately billable procedures and treating other patients.  Critical care was necessary to treat or prevent imminent or life-threatening deterioration.  Critical care was time spent personally by me on the following activities: development of treatment plan with patient and/or surrogate as well as nursing, discussions with consultants, evaluation of patient's response to treatment, examination of patient, obtaining history from patient or surrogate, ordering and performing treatments and interventions, ordering and review of laboratory studies, ordering and review of radiographic studies, pulse oximetry and re-evaluation of patient's condition.  Naethan Bracewell  Lana Fish, NP-C [facility] Contact / Pager information can be found on Amion  09/13/2019, 7:21 AM

## 2019-09-14 LAB — GLUCOSE, CAPILLARY
Glucose-Capillary: 173 mg/dL — ABNORMAL HIGH (ref 70–99)
Glucose-Capillary: 204 mg/dL — ABNORMAL HIGH (ref 70–99)
Glucose-Capillary: 220 mg/dL — ABNORMAL HIGH (ref 70–99)
Glucose-Capillary: 222 mg/dL — ABNORMAL HIGH (ref 70–99)
Glucose-Capillary: 234 mg/dL — ABNORMAL HIGH (ref 70–99)
Glucose-Capillary: 241 mg/dL — ABNORMAL HIGH (ref 70–99)

## 2019-09-14 MED ORDER — INSULIN GLARGINE 100 UNIT/ML ~~LOC~~ SOLN
30.0000 [IU] | Freq: Every day | SUBCUTANEOUS | Status: DC
  Administered 2019-09-14 – 2019-09-15 (×2): 30 [IU] via SUBCUTANEOUS
  Filled 2019-09-14 (×3): qty 0.3

## 2019-09-14 MED ORDER — CLONIDINE HCL 0.1 MG PO TABS: 0.1000 mg | ORAL_TABLET | Freq: Three times a day (TID) | ORAL | Status: DC

## 2019-09-14 MED ORDER — POTASSIUM CHLORIDE 20 MEQ/15ML (10%) PO SOLN
40.0000 meq | Freq: Once | ORAL | Status: AC
  Administered 2019-09-14: 40 meq

## 2019-09-14 MED ORDER — CLONIDINE HCL 0.2 MG PO TABS
0.2000 mg | ORAL_TABLET | Freq: Three times a day (TID) | ORAL | Status: DC
  Filled 2019-09-14: qty 1

## 2019-09-14 MED ORDER — IPRATROPIUM-ALBUTEROL 0.5-2.5 (3) MG/3ML IN SOLN
3.0000 mL | Freq: Four times a day (QID) | RESPIRATORY_TRACT | Status: DC
  Administered 2019-09-14 – 2019-09-21 (×29): 3 mL via RESPIRATORY_TRACT
  Filled 2019-09-14 (×30): qty 3

## 2019-09-14 MED ORDER — FUROSEMIDE 10 MG/ML IJ SOLN
40.0000 mg | INTRAMUSCULAR | Status: AC
  Administered 2019-09-14 (×2): 40 mg via INTRAVENOUS
  Filled 2019-09-14 (×2): qty 4

## 2019-09-14 MED ORDER — CLONIDINE HCL 0.1 MG PO TABS
0.1000 mg | ORAL_TABLET | Freq: Three times a day (TID) | ORAL | Status: AC
  Administered 2019-09-16 – 2019-09-17 (×6): 0.1 mg
  Filled 2019-09-14 (×6): qty 1

## 2019-09-14 MED ORDER — FREE WATER
300.0000 mL | Status: DC
  Administered 2019-09-14 – 2019-09-16 (×11): 300 mL

## 2019-09-14 MED ORDER — POTASSIUM CHLORIDE 20 MEQ/15ML (10%) PO SOLN
40.0000 meq | Freq: Once | ORAL | Status: DC
  Filled 2019-09-14: qty 30

## 2019-09-14 MED ORDER — CLONIDINE HCL 0.2 MG PO TABS
0.2000 mg | ORAL_TABLET | Freq: Three times a day (TID) | ORAL | Status: AC
  Administered 2019-09-14 – 2019-09-15 (×6): 0.2 mg
  Filled 2019-09-14 (×5): qty 1

## 2019-09-14 NOTE — Progress Notes (Signed)
Referring Physician(s): Myrla Halsted  Supervising Physician: Corrie Mckusick  Patient Status:  [facility] - In-pt  Chief Complaint: stroke   Subjective: Pt s/p trach yesterday; follows commands; moving rt side, moves LLE to pain but not LUE ; no new labs   Allergies: Patient has no known allergies.  Medications: Prior to Admission medications   Medication Sig Start Date End Date Taking? Authorizing Provider  amLODipine (NORVASC) 10 MG tablet Take 1 tablet (10 mg total) by mouth daily. 05/12/19  Yes Minette Brine, FNP  aspirin EC 81 MG tablet Take 81 mg by mouth every evening.   Yes [provider]  Calcium Carbonate-Vitamin D (CALCIUM-D PO) Take 2 tablets by mouth daily at 12 noon.    Yes [provider]  carvedilol (COREG) 6.25 MG tablet TAKE 1 TABLET(6.25 MG) BY MOUTH TWICE DAILY Patient taking differently: Take 6.25 mg by mouth 2 (two) times daily with a meal.  07/31/19  Yes Minette Brine, FNP  hydrALAZINE (APRESOLINE) 50 MG tablet Take 1 tablet (50 mg total) by mouth 2 (two) times daily. 06/16/19 09/14/19 Yes Elouise Munroe, MD  Insulin Degludec-Liraglutide (XULTOPHY) 100-3.6 UNIT-MG/ML SOPN Inject 30 Units into the skin daily. 08/14/19  Yes Minette Brine, FNP  telmisartan-hydrochlorothiazide (MICARDIS HCT) 40-12.5 MG tablet Take 1 tablet by mouth daily. 07/31/19  Yes Minette Brine, FNP  ticagrelor (BRILINTA) 90 MG TABS tablet Take 1 tablet (90 mg total) by mouth 2 (two) times daily. 06/12/19  Yes Elouise Munroe, MD  vitamin C (ASCORBIC ACID) 250 MG tablet Take 500 mg by mouth daily at 12 noon.   Yes [provider]  blood glucose meter kit and supplies KIT Dispense based on patient and insurance preference. Use up to four times daily as directed. (FOR ICD-9 250.00, 250.01). 05/10/18   Minette Brine, FNP  Blood Glucose Monitoring Suppl (TRUE METRIX METER) w/Device KIT 1 each by Does not apply route in the morning, at noon, in the evening, and at bedtime. 05/22/19    Minette Brine, FNP  Evolocumab (REPATHA SURECLICK) 170 MG/ML SOAJ Inject 140 mg into the skin every 14 (fourteen) days. 06/30/19   Elouise Munroe, MD  glucose blood (TRUE METRIX BLOOD GLUCOSE TEST) test strip Check blood sugar 4 times a day before meals and bedtime 05/22/19   Minette Brine, FNP  Insulin Glargine-Lixisenatide (SOLIQUA) 100-33 UNT-MCG/ML SOPN Inject 25 Units into the skin daily. Patient taking differently: Inject 30 Units into the skin daily.  07/14/19   Minette Brine, FNP  Multiple Vitamin (MULTIVITAMIN PO) Take 1 tablet by mouth daily at 12 noon.     [provider]  Omega-3 Fatty Acids (OMEGA-3 PLUS PO) Take 2 capsules by mouth daily at 12 noon.    [provider]     Vital Signs: BP (!) 126/57   Pulse 90   Temp 98.5 F (36.9 C) (Oral)   Resp (!) 21   Ht _0  (1.549 m)   Wt 186 lb 11.7 oz (84.7 kg)   SpO2 100%   BMI 35.28 kg/m   Physical Exam trach in place; FC; moves rt side; moves LLE to pain but not LUE; pupils 47m equal but sluggish  Imaging: DG Chest Port 1 View  Result Date: 09/13/2019 CLINICAL DATA:  Status post tracheostomy placement. EXAM: PORTABLE CHEST 1 VIEW COMPARISON:  Single-view of the chest 09/08/2019. FINDINGS: Right PICC and feeding tube remain in place. New tracheostomy tube is seen with the tip in good position at the level  the clavicular heads. Support apparatus overlies the left upper chest but there appears to be airspace disease in the left upper lobe. Right lung clear. Heart size normal. Atherosclerosis. IMPRESSION: Tracheostomy tube in good position. Findings worrisome for left upper lobe pneumonia although support apparatus overlies the area of interest. Electronically Signed   By: Inge Rise M.D.   On: 09/13/2019 16:39    Labs:  CBC: Recent Labs    09/09/19 0525 09/10/19 1134 09/11/19 0412 09/13/19 0500  WBC 6.5 6.9 7.0 6.2  HGB 4.3* 4.4* 4.4* 4.3*  HCT 15.6* 16.3* 16.3* 15.4*  PLT 239 274 304 366     COAGS: Recent Labs    11/27/18 0728 08/18/19 0705 08/26/19 1715 08/27/19 0230  INR 0.9 0.9 1.1 1.2    BMP: Recent Labs    09/07/19 0512 09/09/19 0525 09/11/19 0412 09/13/19 0500  NA 144 146* 146* 147*  K 3.4* 3.8 4.0 4.1  CL 108 110 111 111  CO2 _0 GLUCOSE 148* 170* 214* 118*  BUN 23 24* 21 24*  CALCIUM 8.1* 8.4* 8.5* 8.6*  CREATININE 0.76 0.76 0.69 0.73  GFRNONAA >60 >60 >60 >60  GFRAA >60 >60 >60 >60    LIVER FUNCTION TESTS: Recent Labs    09/07/19 0512 09/09/19 0525 09/11/19 0412 09/13/19 0500  BILITOT 0.8 0.4 0.7 0.6  AST 34 _1 ALT _2 ALKPHOS 46 49 51 55  PROT 5.5* 5.8* 6.0* 5.8*  ALBUMIN 1.8* 1.7* 1.7* 1.7*    Assessment and Plan: Right ICA cervical/petrous junction stenosis s/p revascularization using balloon angioplasty via right radial and right femoral approach6/14/2021by Dr. Estanislado Pandy.Rt MCA CVA/left lacunar infarcts Mild-moderate right thigh (with some retroperitoneal component) hematoma. Acute blood loss anemia, Jehovah's Witness  S/p trach yesterday; CXR shows good positioning of trach,? LUL PNA- monitor; no new labs; possible G tube at some point depending upon stability of hgb; antiplatelets held   Electronically Signed: D. Rowe Robert, PA-C 09/14/2019, 10:30 AM   I spent a total of 15 minutes at the the patient's bedside AND on the patient's  floor or unit, greater than 50% of which was counseling/coordinating care for  right ICA stenosis s/p revascularization    Patient ID: Donna [name], female   DOB: [birth year], 69 y.o.   MRN: [id number]

## 2019-09-14 NOTE — Progress Notes (Signed)
Orthopedic Tech Progress Note Patient Details:  Donna [name] 1950/09/13 [id number]  Ortho Devices Type of Ortho Device: Prafo boot/shoe Ortho Device/Splint Location: LLE Ortho Device/Splint Interventions: Application, Ordered   Post Interventions Patient Tolerated: Well Instructions Provided: Care of device   [name] 09/14/2019, 2:16 PM

## 2019-09-14 NOTE — Progress Notes (Signed)
NAME:  Donna [name], MRN:  [id number], DOB:  [birth year], LOS: 20 ADMISSION DATE:  08/25/2019, CONSULTATION DATE:  08/27/2019 REFERRING MD:  Dr. 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  Was placed on ASA/ brillinta.  Monitored in Neuro ICU.  Noted to have developed hypotension, increased left sided weakness and lethargy on 6/15 am with Hgb drop from 13.3 to 6.9 with new AKI.  Post MRI/ MRA showed scattered small acute infarcts along the right MCA/ watershed territory suspected to be resultant either post procedure vs hypoperfusion.  Neurology was consulted with recommendations to keep SBP > 140 for cerebral perfusion.  A CT abd/ pelvis was obtained given concern for retroperitoneal bleed which showed a mild to moderate amount of retroperitoneal hematoma in the right iliac and inguinal regiones with moderate size hematoma noted the the soft tissues anterior to the musculature of the right hip; no definite intrapertioneal hemorrhage. INR, PT, and platelets rechecked and were wnl.  Vascular surgery was consulted with plans to observe given bleeding was mostly into the muscle and tissue.  She was started on neosynephrine to keep SBP goal > 140-160 for cerebral perfusion.  PCCM consulted to help with vasopressor support.  S/p 1L bolus  Repeat Hgb now 6.9-> 7-> 5.1.   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    ETT 6/19 > 6/23,  6/23 >  Significant Diagnostic Tests:   6/15 MRA/ MRI brain  > Scattered small acute infarcts along the right MCA/watershed territory. Solitary small acute infarct in the left parietal cortex and right cerebellum. Improved right ICA patency at the petrous segment. Known severe right M1 segment stenosis. 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  6/15 CT a/p wo contrast > Mild to moderate amount of retroperitoneal hematoma is noted in the right iliac and inguinal regions. Moderate size hematoma is noted in the soft tissues anterior to the musculature of the right hip. No definite intraperitoneal hemorrhage is noted. Moderate size fat containing periumbilical hernia. Aortic Atherosclerosis.   Echo 6/16 > normal LVEF  6/29 MRI brain > Progressed size and confluence of ischemia throughout much of the right hemisphere white matter since 08/26/2019. Associated petechial blood products but no malignant hemorrhagic transformation or mass Effect. There are also multiple new lacunar type infarcts elsewhere, including the brainstem and the left basal ganglia. Underlying chronic infarcts of the right basal ganglia and left cerebellum.  Micro Data:  6/10 SARS 2 >> neg 6/14 MRSA PCR >> neg 6/20 trach asp >> few diptheroids/ corynebacterium species 6/18 BC x 2 >> neg   Antimicrobials:  6/14 cefazolin pre-op  6/20 unasyn >> 09/02/2019  Interim history/subjective:  No acute events overnight Tolerating SBT  this am Remains 10L+  Objective   Blood pressure (!) 121/48, pulse 79, temperature 99.9 F (37.7 C), temperature source Axillary, resp. rate 17, height 5\' 1"   (1.549 m), weight 84.7 kg, SpO2 100 %.    Vent Mode: PRVC FiO2 (%):  [40 %] 40 % Set Rate:  [18 bmp] 18 bmp Vt Set:  [410 mL] 410 mL PEEP:  [5 cmH20] 5 cmH20 Pressure Support:  [10 cmH20] 10 cmH20 Plateau Pressure:  [21 cmH20] 21 cmH20   Intake/Output Summary (Last 24 hours) at 09/14/2019 11/15/2019 Last data filed at 09/14/2019 0600 Gross per 24 hour  Intake 1596.89 ml  Output 900 ml  Net 696.89 ml   Filed Weights   09/13/19 0500 09/13/19 1033 09/14/19 0443  Weight: 86.6 kg 86.5 kg 84.7 kg    Examination:  General: Chronically ill appearing deconditioned elderly female lying in bed on mechanical ventilation in NAD  HEENT: 6 cuffed shiley trach midline, MM pink/moist, PERRL, sclera non-icteric  Neuro: Alert with eyes spontaneously open but does not follow commands  CV: s1s2 regular rate and rhythm, no murmur, rubs, or gallops,  PULM:  Course bilateral but rhonchi improved compared to day prior  GI: soft, bowel sounds active in all 4 quadrants, non-tender, non-distended, tolerating TF Extremities: warm/dry, generalized non-pitting edema  Skin: no rashes or lesions   Resolved Hospital Problem list   Hypotension  AKI  Assessment & Plan:   Right MCA CVA due to bilateral intracranial stenoses  -s/p RT ICA cer/petrous junction balloon angioplasty for severe stenosis c/b right thigh and retroperitoneal hematoma.  Persistent left-sided weakness. P: Supportive care  PT/OT as able  Nutrition and bowel regiment  Seizure precautions  Continue secondary stroke prevention  Avoid anticoagulation or antiplatelets given high risk for bleed  Acute respiratory failure with hypoxia requiring mechanical ventilation.  -Failed trial of extubation due to upper airway stridor.  Cords visibly scarred on reintubation. -S/P trach 7/3 P: Continue ventilator support with lung protective strategies  Wean vent support with goal of ATC Continue chest PT Head of bed elevated 30 degrees. Plateau  pressures less than 30 cm H20.  Follow intermittent chest x-ray and ABG.   Ensure adequate pulmonary hygiene  Follow cultures  VAP bundle in place  PAD protocol  Acute blood loss anemia  - Jehovah's Witness, no blood products including albumin. Hemoglobin has stabilized in the 4 range.  No evidence of active bleeding. P: Continue severe anemia protocol: Iron, vitamin C, vitamin B12, folic acid, and Epogen Limit blood draws  Supportive care   Intermittently febrile  -Patient seen with intermittent fevers with Tmax 102 overnight, has been receiving PRN tylenol  P: WBC remains stable  Lung sounds improved with pulmonary hygiene (patient was bitting ETT preventing adequate pulmonary hygiene, since trach this has improved) Continue to monitor closely for need of antibiotics  Due for blood work tomorrow could consider obtunding PCT   Daily Goals Checklist  Pain/Anxiety/Delirium protocol (if indicated): Precedex VAP protocol (if indicated): Bundle in place Respiratory support goals: Daily SBT Blood pressure target: Keep systolic blood pressure less than 160 DVT prophylaxis: Mechanical DVT prophylaxis only given anemia and high bleeding risk. Nutrition Status: Tolerating tube feeds GI prophylaxis: Pantoprazole Fluid status goals: Allow autoregulation Urinary catheter: External catheter Central lines: PICC line Glucose control: Adequate glycemic control, type 2 diabetes. Mobility/therapy needs: PT OT Antibiotic de-escalation: No antibiotics Home medication reconciliation: On hold Daily labs: CBC daily. Code Status: Full code. Family Communication: Son 11/15/19 updated daily  Disposition: ICU.  Labs   CBC: Recent Labs  Lab 09/09/19 0525 09/10/19 1134 09/11/19 0412 09/13/19 0500  WBC 6.5 6.9 7.0 6.2  HGB 4.3* 4.4* 4.4* 4.3*  HCT 15.6* 16.3* 16.3* 15.4*  MCV 109.9* 110.1* 110.1* 113.2*  PLT 239 274 304 366    Basic Metabolic Panel: Recent Labs  Lab 09/09/19 0525  09/11/19 0412 09/13/19 0500  NA 146* 146* 147*  K 3.8 4.0 4.1  CL 110 111 111  CO2 29 30 28   GLUCOSE 170* 214* 118*  BUN 24* 21 24*  CREATININE 0.76 0.69 0.73  CALCIUM 8.4* 8.5* 8.6*   GFR: Estimated Creatinine Clearance: 66.5 mL/min (by C-G formula based on SCr of 0.73 mg/dL). Recent Labs  Lab 09/09/19 0525 09/10/19 1134 09/11/19 0412 09/13/19 0500  WBC 6.5 6.9 7.0 6.2    Liver Function Tests: Recent Labs  Lab 09/09/19 0525 09/11/19 0412 09/13/19 0500  AST 28 23 23   ALT 29 28 24   ALKPHOS 49 51 55  BILITOT 0.4 0.7 0.6  PROT 5.8* 6.0* 5.8*  ALBUMIN 1.7* 1.7* 1.7*   No results for input(s): LIPASE, AMYLASE in the last 168 hours. No results for input(s): AMMONIA in the last 168 hours.  ABG    Component Value Date/Time   PHART 7.407 09/03/2019 2106   PCO2ART 60.9 (H) 09/03/2019 2106   PO2ART 87 09/03/2019 2106   HCO3 38.0 (H) 09/03/2019 2106   TCO2 40 (H) 09/03/2019 2106   ACIDBASEDEF 1.0 08/28/2019 0505   O2SAT 96.0 09/03/2019 2106     Coagulation Profile: No results for input(s): INR, PROTIME in the last 168 hours.  Cardiac Enzymes: No results for input(s): CKTOTAL, CKMB, CKMBINDEX, TROPONINI in the last 168 hours.  HbA1C: Hemoglobin A1C  Date/Time Value Ref Range Status  10/12/2017 12:00 AM 9.7  Final   Hgb A1c MFr Bld  Date/Time Value Ref Range Status  05/22/2019 01:12 PM 11.2 (H) 4.8 - 5.6 % Final    Comment:             Prediabetes: 5.7 - 6.4          Diabetes: >6.4          Glycemic control for adults with diabetes: <7.0   11/27/2018 07:27 AM 11.3 (H) 4.8 - 5.6 % Final    Comment:    (NOTE)         Prediabetes: 5.7 - 6.4         Diabetes: >6.4         Glycemic control for adults with diabetes: <7.0     CBG: Recent Labs  Lab 09/13/19 1201 09/13/19 1545 09/13/19 1916 09/13/19 2321 09/14/19 0307  GLUCAP 185* 65* 102* 183* 234*    Critical care time:    Performed by: Delfin GantWhitney F Evalina Tabak  Total critical care time: 37  minutes  Critical care time was exclusive of separately billable procedures and treating other patients.  Critical care was necessary to treat or prevent imminent or life-threatening deterioration.  Critical care was time spent personally by me on the following activities: development of treatment plan with patient and/or surrogate as well as nursing, discussions with consultants, evaluation of patient's response to treatment, examination of patient, obtaining history from patient or surrogate, ordering and performing treatments and interventions, ordering and review of laboratory studies, ordering and review of radiographic studies, pulse oximetry and re-evaluation of patient's condition.  Delfin GantWhitney F Davinia Riccardi, NP-C [facility] Contact / Pager information can be found  on Amion  09/14/2019, 7:22 AM

## 2019-09-14 NOTE — Evaluation (Signed)
Physical Therapy Evaluation Patient Details Name: Donna [name] MRN: [id number] DOB: February 08, 1951 Today's Date: 09/14/2019   History of Present Illness  69 y.o. female with PMH significant for HTN, HLD, CVA 06/2018, DM, anemia, obesity, OSA, and OA. She is known to [facility] and has been followed by Dr. Corliss Skains since 10/2018. She first presented to our department at the request of Dr. Lucia Gaskins for management of intracranial stenosis, thought to be cause of recent CVA at that time. Pt underwent R ICA angioplasty on 6/14. Pt with decline in neuro status and hemoglobin on 6/15, found to have a large R groin hematoma.  Pt has had Hbg 5.0 or less and is a Jehovah's witness.  She was intubated on 6/19. Pt underwent tracheostomy on 7/3 and remains ventilated.  Clinical Impression  Pt presents to PT with deficits in functional mobility, gait, balance, endurance, strength, power, communication. Pt tolerates bed in chair mode well, following motor commands consistently with RUE and RLE but none with L side. Difficult to communicate with pt at this time as newly trached and still on the ventilator, pt unable to nod yes/no or close eyes to answer questions at this time. Pt is generally weak on R side and flaccid on left and due to prolonged immobility will likely require max-totalA for all functional mobility at this time. Pt will benefit from acute PT services to progress mobility and strength and to reduce caregiver burden. PT recommends SNF placement vs LTACH at the time of discharge at this time.    Follow Up Recommendations SNF    Equipment Recommendations  Wheelchair (measurements PT);Wheelchair cushion (measurements PT);Hospital bed (mechanical lift if home today)    Recommendations for Other Services       Precautions / Restrictions Precautions Precautions: Fall Precaution Comments: trach to vent Restrictions Weight Bearing Restrictions: No      Mobility  Bed Mobility Overal bed mobility: Needs  Assistance             General bed mobility comments: pt assisted into bed in chair position, pt pulls forward into long sit with maxA using RUE to clear back from bed  Transfers                    Ambulation/Gait                Stairs            Wheelchair Mobility    Modified Rankin (Stroke Patients Only) Modified Rankin (Stroke Patients Only) Pre-Morbid Rankin Score: No symptoms Modified Rankin: Severe disability     Balance Overall balance assessment: Needs assistance Sitting-balance support: Single extremity supported;Feet supported Sitting balance-Leahy Scale: Zero Sitting balance - Comments: maxA to maintain long sitting from bed in chair mode Postural control: Posterior lean                                   Pertinent Vitals/Pain Pain Assessment: Faces Pain Score: 0-No pain Faces Pain Scale: No hurt    Home Living Family/patient expects to be discharged to:: Private residence Living Arrangements: Children;Other relatives Available Help at Discharge: Family Type of Home: House Home Access: Level entry     Home Layout: One level Home Equipment: None Additional Comments: no family present, obtained from chart review of earlier OT eval this admission    Prior Function Level of Independence: Independent         Comments:  per OT eval earlier this admission     Hand Dominance        Extremity/Trunk Assessment   Upper Extremity Assessment Upper Extremity Assessment: RUE deficits/detail;LUE deficits/detail RUE Deficits / Details: grossly 4-/5 except shoulder flexion 3-/5. Shoulder flexion AROM limited to ~30 degrees, PROM WFL LUE Deficits / Details: flaccid, PROM WFL, edematous    Lower Extremity Assessment Lower Extremity Assessment: RLE deficits/detail;LLE deficits/detail RLE Deficits / Details: ankle PF/DF 3/5, ankle ROM to neutral DF, no AROM noted of knee or hip at this time, PROM WFL although stiff  initially LLE Deficits / Details: flaccid, PROM WFL although stiff initially    Cervical / Trunk Assessment Cervical / Trunk Assessment: Other exceptions Cervical / Trunk Exceptions: preference for R cervical rotation, can track left and can turn head to midline, requires assist to turn rotate head past midline  Communication   Communication: Tracheostomy (trach to vent)  Cognition Arousal/Alertness: Awake/alert Behavior During Therapy: Flat affect Overall Cognitive Status: Difficult to assess                                 General Comments: pt follows motor commands with RLE, PT unable to get pt to mouth words, nod head, or close eyes to questions in an attempt to communicate      General Comments General comments (skin integrity, edema, etc.): VSS, pt trach to vent 40% FiO2, PEEP of 5. BP pre-mobility 121/48, sitting 108/45, supine 120/53.    Exercises Other Exercises Other Exercises: PROM bilateral ankle, knee, hip flexion and extension for 10 reps PROM Other Exercises: PROM bilateral shoulder flexion/abduction, elbow flexion/extension, wrist flexion/extension and composite digit flexion/extension for 10 reps   Assessment/Plan    PT Assessment Patient needs continued PT services  PT Problem List Decreased strength;Decreased range of motion;Decreased activity tolerance;Decreased balance;Decreased mobility;Decreased coordination;Decreased cognition;Decreased knowledge of use of DME;Decreased safety awareness;Decreased knowledge of precautions;Cardiopulmonary status limiting activity       PT Treatment Interventions DME instruction;Gait training;Functional mobility training;Therapeutic activities;Therapeutic exercise;Balance training;Neuromuscular re-education;Cognitive remediation;Patient/family education;Wheelchair mobility training    PT Goals (Current goals can be found in the Care Plan section)  Acute Rehab PT Goals Patient Stated Goal: pt unable to state, PT  goal to improve bed mobility and transfer quality PT Goal Formulation: Patient unable to participate in goal setting Time For Goal Achievement: 09/28/19 Potential to Achieve Goals: Fair    Frequency Min 3X/week   Barriers to discharge        Co-evaluation               AM-PAC PT "6 Clicks" Mobility  Outcome Measure Help needed turning from your back to your side while in a flat bed without using bedrails?: Total Help needed moving from lying on your back to sitting on the side of a flat bed without using bedrails?: Total Help needed moving to and from a bed to a chair (including a wheelchair)?: Total Help needed standing up from a chair using your arms (e.g., wheelchair or bedside chair)?: Total Help needed to walk in hospital room?: Total Help needed climbing 3-5 steps with a railing? : Total 6 Click Score: 6    End of Session Equipment Utilized During Treatment: Oxygen Activity Tolerance: Patient tolerated treatment well Patient left: in bed;with call bell/phone within reach;with bed alarm set Nurse Communication: Mobility status;Need for lift equipment PT Visit Diagnosis: Other abnormalities of gait and mobility (R26.89);Muscle weakness (generalized) (M62.81);Other  symptoms and signs involving the nervous system (R29.898);Hemiplegia and hemiparesis Hemiplegia - Right/Left: Left Hemiplegia - caused by: Cerebral infarction    Time: 8127-5170 PT Time Calculation (min) (ACUTE ONLY): 14 min   Charges:   PT Evaluation $PT Eval High Complexity: 1 High          Arlyss Gandy, PT, DPT Acute Rehabilitation Pager: [phone]   Arlyss Gandy 09/14/2019, 1:52 PM

## 2019-09-14 NOTE — Progress Notes (Signed)
SLP Cancellation Note  Patient Details Name: Donna [name] MRN: [id number] DOB: Feb 28, 1951   Cancelled treatment:       Reason Eval/Treat Not Completed: Medical issues which prohibited therapy  .Patient with new tracheostomy on 09/13/19 and currently on the ventilator. Orders for SLP eval and treat for PMSV and swallowing received. Will follow pt closely for readiness for SLP interventions as appropriate.    Dimas Aguas, MA, CCC-SLP Acute Rehab SLP [phone]  Fleet Contras 09/14/2019, 7:06 AM

## 2019-09-14 NOTE — Progress Notes (Signed)
   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   Palliative care was asked to aid in goals of care conversations in the setting of patient having failed weaning trials from ventilatory support.  Today's Discussion (09/14/2019): Chart reviewed. I met with Donna [name] at bedside.   She not has a tracheostomy enabling more thorough oral care. Patients bedside RN stated that she was not able to follow commands as well overnight as compared to the day prior. She had a fever overnight as well. The primary team is aware of these concerns.   I called Donna [name] to provide an update. I asked him if he had any questions regarding our conversation from two days prior. He shares that he does not and remains optimistic. I told Donna [name] that we will remain peripherally involved and if he has any questions moving forward to call our service.   Discussed the importance of continued conversation with family and their  medical providers regarding overall plan of care and treatment options, ensuring decisions are within the context of the patients values and GOCs.  Questions and concerns addressed   Decision Maker: Donna [name] (Son) [phone]  SUMMARY OF RECOMMENDATIONS Full Code, full scope   S/P Tracheostomy remains on ventilator support  Chaplain to provide ongoing JW support  PMT will continue to peripherally follow, please call  directly if any immanent needs  Time Spent: 15 Greater than 50% of the time was spent in counseling and coordination of care ______________________________________________________________________________________ [place] Team Team Cell Phone: [phone] Please utilize secure chat with additional questions, if there is no response within 30 minutes please call the above phone number  Palliative Medicine Team providers are available by phone from 7am to 7pm daily and can be reached through the team cell phone.  Should this patient require assistance outside of these hours, please call the patient's attending physician.

## 2019-09-15 LAB — CBC
HCT: 17.5 % — ABNORMAL LOW (ref 36.0–46.0)
Hemoglobin: 4.9 g/dL — CL (ref 12.0–15.0)
MCH: 30.8 pg (ref 26.0–34.0)
MCHC: 28 g/dL — ABNORMAL LOW (ref 30.0–36.0)
MCV: 110.1 fL — ABNORMAL HIGH (ref 80.0–100.0)
Platelets: 524 10*3/uL — ABNORMAL HIGH (ref 150–400)
RBC: 1.59 MIL/uL — ABNORMAL LOW (ref 3.87–5.11)
RDW: 20.5 % — ABNORMAL HIGH (ref 11.5–15.5)
WBC: 14.9 10*3/uL — ABNORMAL HIGH (ref 4.0–10.5)
nRBC: 1.7 % — ABNORMAL HIGH (ref 0.0–0.2)

## 2019-09-15 LAB — GLUCOSE, CAPILLARY
Glucose-Capillary: 217 mg/dL — ABNORMAL HIGH (ref 70–99)
Glucose-Capillary: 250 mg/dL — ABNORMAL HIGH (ref 70–99)
Glucose-Capillary: 212 mg/dL — ABNORMAL HIGH (ref 70–99)
Glucose-Capillary: 208 mg/dL — ABNORMAL HIGH (ref 70–99)
Glucose-Capillary: 181 mg/dL — ABNORMAL HIGH (ref 70–99)
Glucose-Capillary: 216 mg/dL — ABNORMAL HIGH (ref 70–99)

## 2019-09-15 LAB — COMPREHENSIVE METABOLIC PANEL
ALT: 26 U/L (ref 0–44)
AST: 20 U/L (ref 15–41)
Albumin: 1.7 g/dL — ABNORMAL LOW (ref 3.5–5.0)
Alkaline Phosphatase: 72 U/L (ref 38–126)
Anion gap: 10 (ref 5–15)
BUN: 27 mg/dL — ABNORMAL HIGH (ref 8–23)
CO2: 27 mmol/L (ref 22–32)
Calcium: 8.5 mg/dL — ABNORMAL LOW (ref 8.9–10.3)
Chloride: 106 mmol/L (ref 98–111)
Creatinine, Ser: 0.78 mg/dL (ref 0.44–1.00)
GFR calc Af Amer: >60 mL/min (ref >60)
GFR calc non Af Amer: >60 mL/min (ref >60)
Glucose, Bld: 262 mg/dL — ABNORMAL HIGH (ref 70–99)
Potassium: 3.8 mmol/L (ref 3.5–5.1)
Sodium: 143 mmol/L (ref 135–145)
Total Bilirubin: 0.8 mg/dL (ref 0.3–1.2)
Total Protein: 6.7 g/dL (ref 6.5–8.1)

## 2019-09-15 MED ORDER — GUAIFENESIN 100 MG/5ML PO SOLN
10.0000 mL | Freq: Four times a day (QID) | ORAL | Status: AC
  Administered 2019-09-15 – 2019-09-18 (×12): 200 mg
  Filled 2019-09-15 (×9): qty 15
  Filled 2019-09-15: qty 30
  Filled 2019-09-15: qty 15
  Filled 2019-09-15: qty 30

## 2019-09-15 MED ORDER — CHLORHEXIDINE GLUCONATE 0.12 % MT SOLN
15.0000 mL | Freq: Two times a day (BID) | OROMUCOSAL | Status: DC
  Administered 2019-09-15 – 2019-09-17 (×5): 15 mL via OROMUCOSAL
  Filled 2019-09-15 (×4): qty 15

## 2019-09-15 MED ORDER — ORAL CARE MOUTH RINSE
15.0000 mL | Freq: Two times a day (BID) | OROMUCOSAL | Status: DC
  Administered 2019-09-15 – 2019-09-17 (×6): 15 mL via OROMUCOSAL

## 2019-09-15 MED ORDER — FUROSEMIDE 10 MG/ML IJ SOLN
40.0000 mg | Freq: Once | INTRAMUSCULAR | Status: AC
  Administered 2019-09-15: 40 mg via INTRAVENOUS
  Filled 2019-09-15: qty 4

## 2019-09-15 NOTE — Progress Notes (Signed)
Referring Physician(s): [place] Physician: Luanne Bras  Patient Status:  [facility] - In-pt  Chief Complaint: stroke   Subjective: Pt without acute changes; some increased secretions post trach; follows some commands; moves rt side, no sig left sided movement though   Allergies: Patient has no known allergies.  Medications: Prior to Admission medications   Medication Sig Start Date End Date Taking? Authorizing Provider  amLODipine (NORVASC) 10 MG tablet Take 1 tablet (10 mg total) by mouth daily. 05/12/19  Yes Minette Brine, FNP  aspirin EC 81 MG tablet Take 81 mg by mouth every evening.   Yes [provider]  Calcium Carbonate-Vitamin D (CALCIUM-D PO) Take 2 tablets by mouth daily at 12 noon.    Yes [provider]  carvedilol (COREG) 6.25 MG tablet TAKE 1 TABLET(6.25 MG) BY MOUTH TWICE DAILY Patient taking differently: Take 6.25 mg by mouth 2 (two) times daily with a meal.  07/31/19  Yes Minette Brine, FNP  hydrALAZINE (APRESOLINE) 50 MG tablet Take 1 tablet (50 mg total) by mouth 2 (two) times daily. 06/16/19 09/14/19 Yes Elouise Munroe, MD  Insulin Degludec-Liraglutide (XULTOPHY) 100-3.6 UNIT-MG/ML SOPN Inject 30 Units into the skin daily. 08/14/19  Yes Minette Brine, FNP  telmisartan-hydrochlorothiazide (MICARDIS HCT) 40-12.5 MG tablet Take 1 tablet by mouth daily. 07/31/19  Yes Minette Brine, FNP  ticagrelor (BRILINTA) 90 MG TABS tablet Take 1 tablet (90 mg total) by mouth 2 (two) times daily. 06/12/19  Yes Elouise Munroe, MD  vitamin C (ASCORBIC ACID) 250 MG tablet Take 500 mg by mouth daily at 12 noon.   Yes [provider]  blood glucose meter kit and supplies KIT Dispense based on patient and insurance preference. Use up to four times daily as directed. (FOR ICD-9 250.00, 250.01). 05/10/18   Minette Brine, FNP  Blood Glucose Monitoring Suppl (TRUE METRIX METER) w/Device KIT 1 each by Does not apply route in the morning, at noon, in  the evening, and at bedtime. 05/22/19   Minette Brine, FNP  Evolocumab (REPATHA SURECLICK) 144 MG/ML SOAJ Inject 140 mg into the skin every 14 (fourteen) days. 06/30/19   Elouise Munroe, MD  glucose blood (TRUE METRIX BLOOD GLUCOSE TEST) test strip Check blood sugar 4 times a day before meals and bedtime 05/22/19   Minette Brine, FNP  Insulin Glargine-Lixisenatide (SOLIQUA) 100-33 UNT-MCG/ML SOPN Inject 25 Units into the skin daily. Patient taking differently: Inject 30 Units into the skin daily.  07/14/19   Minette Brine, FNP  Multiple Vitamin (MULTIVITAMIN PO) Take 1 tablet by mouth daily at 12 noon.     [provider]  Omega-3 Fatty Acids (OMEGA-3 PLUS PO) Take 2 capsules by mouth daily at 12 noon.    [provider]     Vital Signs: BP (!) 147/67   Pulse (!) 110   Temp 99.1 F (37.3 C) (Axillary)   Resp (!) 23   Ht 5' 1"  (1.549 m)   Wt 186 lb 11.7 oz (84.7 kg)   SpO2 100%   BMI 35.28 kg/m   Physical Exam trach in place; FC; moves rt side; no sig left sided movement ; pupils 38m equal but sluggish  Imaging: DG Chest Port 1 View  Result Date: 09/13/2019 CLINICAL DATA:  Status post tracheostomy placement. EXAM: PORTABLE CHEST 1 VIEW COMPARISON:  Single-view of the chest 09/08/2019. FINDINGS: Right PICC and feeding tube remain in place. New tracheostomy tube is seen with the tip in good position at the  level the clavicular heads. Support apparatus overlies the left upper chest but there appears to be airspace disease in the left upper lobe. Right lung clear. Heart size normal. Atherosclerosis. IMPRESSION: Tracheostomy tube in good position. Findings worrisome for left upper lobe pneumonia although support apparatus overlies the area of interest. Electronically Signed   By: Inge Rise M.D.   On: 09/13/2019 16:39    Labs:  CBC: Recent Labs    09/09/19 0525 09/10/19 1134 09/11/19 0412 09/13/19 0500  WBC 6.5 6.9 7.0 6.2  HGB 4.3* 4.4* 4.4* 4.3*  HCT 15.6*  16.3* 16.3* 15.4*  PLT 239 274 304 366    COAGS: Recent Labs    11/27/18 0728 08/18/19 0705 08/26/19 1715 08/27/19 0230  INR 0.9 0.9 1.1 1.2    BMP: Recent Labs    09/07/19 0512 09/09/19 0525 09/11/19 0412 09/13/19 0500  NA 144 146* 146* 147*  K 3.4* 3.8 4.0 4.1  CL 108 110 111 111  CO2 28 29 30 28   GLUCOSE 148* 170* 214* 118*  BUN 23 24* 21 24*  CALCIUM 8.1* 8.4* 8.5* 8.6*  CREATININE 0.76 0.76 0.69 0.73  GFRNONAA >60 >60 >60 >60  GFRAA >60 >60 >60 >60    LIVER FUNCTION TESTS: Recent Labs    09/07/19 0512 09/09/19 0525 09/11/19 0412 09/13/19 0500  BILITOT 0.8 0.4 0.7 0.6  AST 34 28 23 23   ALT 28 29 28 24   ALKPHOS 46 49 51 55  PROT 5.5* 5.8* 6.0* 5.8*  ALBUMIN 1.8* 1.7* 1.7* 1.7*    Assessment and Plan: Right ICA cervical/petrous junction stenosis s/p revascularization using balloon angioplasty via right radial and right femoral approach6/14/2021by Dr. Estanislado Pandy.Rt MCA CVA/left lacunar infarcts Mild-moderate right thigh (with some retroperitoneal component) hematoma. Acute blood loss anemia, Jehovah's Witness  S/p trach 7/3; AC held; no new labs (last hgb 4.3 on 7/3); possible G tube at some point depending upon stability of hgb and assessment of swallowing once off sedation; further plans per CCM  Electronically Signed: D. Rowe Robert, PA-C 09/15/2019, 11:29 AM   I spent a total of 15 minutes at the the patient's bedside AND on the patient's hospital floor or unit, greater than 50% of which was counseling/coordinating care for right ICA stenosis s/p revascularization    Patient ID: Donna [name], female   DOB: January 13, 1951, 69 y.o.   MRN: [id number]

## 2019-09-15 NOTE — Progress Notes (Signed)
SLP Cancellation Note  Patient Details Name: SMT LOKEY MRN: [id number] DOB: 12-22-1950   Cancelled treatment:       Reason Eval/Treat Not Completed: Other (comment) Per RN/MD, pt currently weaning and may try to transition to [facility] today. RN plans to stop sedation soon. Will f/u for potential to try PMV once on TC.    Mahala Menghini., M.A. CCC-SLP Acute Rehabilitation Services Pager [phone] Office [phone]  09/15/2019, 9:41 AM

## 2019-09-15 NOTE — Plan of Care (Signed)
  Problem: Nutrition: Goal: Adequate nutrition will be maintained Outcome: Progressing   Problem: Elimination: Goal: Will not experience complications related to bowel motility Outcome: Progressing Goal: Will not experience complications related to urinary retention Outcome: Progressing   Problem: Pain Managment: Goal: General experience of comfort will improve Outcome: Progressing   Problem: Safety: Goal: Ability to remain free from injury will improve Outcome: Progressing   Problem: Activity: Goal: Risk for activity intolerance will decrease Outcome: Not Progressing   Problem: Health Behavior/Discharge Planning: Goal: Ability to manage health-related needs will improve Outcome: Not Progressing

## 2019-09-15 NOTE — Progress Notes (Signed)
Assisted tele visit to patient with family member.  Rollen Selders P, RN  

## 2019-09-15 NOTE — Progress Notes (Signed)
NAME:  Donna [name], MRN:  [id number], DOB:  [birth year], LOS: 21 ADMISSION DATE:  08/25/2019, CONSULTATION DATE:  08/27/2019 REFERRING MD:  Dr. 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  Admitted by Neuro IR on 6/14 for cerebral angiogram s/p RT ICA cervical/ petrous junction balloon angioplasty for severe stenosis via right femoral approach.  Was placed on ASA/ brillinta.  Monitored in Neuro ICU.  Noted to have developed hypotension, increased left sided weakness and lethargy on 6/15 am with Hgb drop from 13.3 to 6.9 with new AKI.  Post MRI/ MRA showed scattered small acute infarcts along the right MCA/ watershed territory suspected to be resultant either post procedure vs hypoperfusion.    A CT abd/ pelvis was obtained given concern for retroperitoneal bleed which showed a mild to moderate amount of retroperitoneal hematoma in the right iliac and inguinal regiones with moderate size hematoma noted the the soft tissues anterior to the musculature of the right hip; no definite intrapertioneal hemorrhage. INR, PT, and platelets rechecked and were wnl.  Vascular surgery was consulted with plans to observe given bleeding was mostly into the muscle and tissue.    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  Consults:  NIR - primary Neurology (stroke has signed off 6/24) Vascular surgery  PCCM Palliative  Care  Procedures:  6/14 cerebral angiogram s/p RT ICA cer/petrous junction balloon angioplasty for severe stenosis.    ETT 6/19 > 6/23,  6/23 >  Significant Diagnostic Tests:   6/15 MRA/ MRI brain  > Scattered small acute infarcts along the right MCA/watershed territory. Solitary small acute infarct in the left parietal cortex and right cerebellum. Improved right ICA patency at the petrous segment. Known severe right M1 segment stenosis. 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  6/15 CT a/p wo contrast > Mild to moderate amount of retroperitoneal hematoma is noted in the right iliac and inguinal regions. Moderate size hematoma is noted in the soft tissues anterior to the musculature of the right hip. No definite intraperitoneal hemorrhage is noted. Moderate size fat containing periumbilical hernia. Aortic Atherosclerosis.   Echo 6/16 > normal LVEF  6/29 MRI brain > Progressed size and confluence of ischemia throughout much of the right hemisphere white matter since 08/26/2019. Associated petechial blood products but no malignant hemorrhagic transformation or mass Effect. There are also multiple new lacunar type infarcts elsewhere, including the brainstem and the left basal ganglia. Underlying chronic infarcts of the right basal ganglia and left cerebellum.  Micro Data:  6/10 SARS 2 >> neg 6/14 MRSA PCR >> neg 6/20 trach asp >> few diptheroids/ corynebacterium species 6/18 BC x 2 >> neg   Antimicrobials:  6/14 cefazolin pre-op  6/20 unasyn >> 09/02/2019  Interim history/subjective:  Tracheostomy over weekend.  Tolerating SBT  Objective   Blood pressure (!) 147/67, pulse (!) 110, temperature 99.1 F (37.3 C), temperature source Axillary, resp. rate (!) 23, height 5\' 1"  (1.549 m), weight 84.7 kg, SpO2 100 %.  Vent Mode: CPAP;PSV FiO2 (%):  [40 %] 40 % Set Rate:  [18 bmp] 18 bmp Vt Set:  [410 mL] 410 mL PEEP:  [5 cmH20] 5 cmH20 Pressure Support:  [8 cmH20] 8  cmH20 Plateau Pressure:  [22 cmH20] 22 cmH20   Intake/Output Summary (Last 24 hours) at 09/15/2019 1106 Last data filed at 09/15/2019 1055 Gross per 24 hour  Intake 2446.72 ml  Output 1700 ml  Net 746.72 ml   Filed Weights   09/13/19 1033 09/14/19 0443 09/15/19 0500  Weight: 86.5 kg 84.7 kg 84.7 kg    Examination:  General: Chronically ill appearing deconditioned elderly female lying in bed on mechanical ventilation in NAD  HEENT: 6 cuffed shiley trach midline, MM pink/moist, PERRL, sclera non-icteric  Neuro: Alert with eyes spontaneously open follows commands weakly on the right.  No movement on the left.  Right gaze deviation. CV: s1s2 regular rate and rhythm, no murmur, rubs, or gallops,  PULM: Diffuse rhonchi GI: soft, bowel sounds active in all 4 quadrants, non-tender, non-distended, tolerating TF Extremities: warm/dry, generalized non-pitting edema  Skin: no rashes or lesions   Resolved Hospital Problem list   Hypotension  AKI  Assessment & Plan:   Right MCA CVA due to bilateral intracranial stenoses  -s/p RT ICA cer/petrous junction balloon angioplasty for severe stenosis c/b right thigh and retroperitoneal hematoma.  Persistent left-sided weakness. P: Supportive care  PT/OT as able  Nutrition and bowel regiment  Seizure precautions  Continue secondary stroke prevention  Avoid anticoagulation or antiplatelets given high risk for bleed  Acute respiratory failure with hypoxia requiring mechanical ventilation.  -Failed trial of extubation due to upper airway stridor.  Cords visibly scarred on reintubation. -S/P trach 7/3 P:  Trial of trach collar today.  Acute blood loss anemia  - Jehovah's Witness, no blood products including albumin. Hemoglobin has stabilized in the 4 range.  No evidence of active bleeding. P: Continue severe anemia protocol: Iron, vitamin C, vitamin B12, folic acid, and Epogen Limit blood draws  Supportive care-spoke to hematology who expects  recovery to be very slow.  Intermittently febrile  -Patient seen with intermittent fevers with Tmax 102 overnight, has been receiving PRN tylenol  P: WBC remains stable  Lung sounds improved with pulmonary hygiene (patient was bitting ETT preventing adequate pulmonary hygiene, since trach this has improved) Continue to monitor closely for need of antibiotics    Daily Goals Checklist  Pain/Anxiety/Delirium protocol (if indicated): Wean Precedex off VAP protocol (if indicated): Bundle in place Respiratory support goals: Daily SBT Blood pressure target: Keep systolic blood pressure less than 160 DVT prophylaxis: Mechanical DVT prophylaxis only given anemia and high bleeding risk. Nutrition Status: Tolerating tube feeds.  Continue tube feeds.  Attempt to see over the course of this week whether she would be able to swallow once off sedation.  Otherwise we will have to proceed with PEG tube. GI prophylaxis: Pantoprazole Fluid status goals: Allow autoregulation Urinary catheter: External catheter Central lines: PICC line Glucose control: Adequate glycemic control, type 2 diabetes. Mobility/therapy needs: PT OT Antibiotic de-escalation: No antibiotics Home medication reconciliation: On hold Daily labs: CBC daily. Code Status: Full code. Family Communication: Son Evaristo Bury updated by Dr. Denese Killings at today. Disposition: ICU.  Labs   CBC: Recent Labs  Lab 09/09/19 0525 09/10/19 1134 09/11/19 0412 09/13/19 0500  WBC 6.5 6.9 7.0 6.2  HGB 4.3* 4.4* 4.4* 4.3*  HCT 15.6* 16.3* 16.3* 15.4*  MCV 109.9* 110.1* 110.1* 113.2*  PLT 239 274 304  366    Basic Metabolic Panel: Recent Labs  Lab 09/09/19 0525 09/11/19 0412 09/13/19 0500  NA 146* 146* 147*  K 3.8 4.0 4.1  CL 110 111 111  CO2 29 30 28   GLUCOSE 170* 214* 118*  BUN 24* 21 24*  CREATININE 0.76 0.69 0.73  CALCIUM 8.4* 8.5* 8.6*   GFR: Estimated Creatinine Clearance: 66.5 mL/min (by C-G formula based on SCr of 0.73  mg/dL). Recent Labs  Lab 09/09/19 0525 09/10/19 1134 09/11/19 0412 09/13/19 0500  WBC 6.5 6.9 7.0 6.2    Liver Function Tests: Recent Labs  Lab 09/09/19 0525 09/11/19 0412 09/13/19 0500  AST 28 23 23   ALT 29 28 24   ALKPHOS 49 51 55  BILITOT 0.4 0.7 0.6  PROT 5.8* 6.0* 5.8*  ALBUMIN 1.7* 1.7* 1.7*   No results for input(s): LIPASE, AMYLASE in the last 168 hours. No results for input(s): AMMONIA in the last 168 hours.  ABG    Component Value Date/Time   PHART 7.407 09/03/2019 2106   PCO2ART 60.9 (H) 09/03/2019 2106   PO2ART 87 09/03/2019 2106   HCO3 38.0 (H) 09/03/2019 2106   TCO2 40 (H) 09/03/2019 2106   ACIDBASEDEF 1.0 08/28/2019 0505   O2SAT 96.0 09/03/2019 2106     Coagulation Profile: No results for input(s): INR, PROTIME in the last 168 hours.  Cardiac Enzymes: No results for input(s): CKTOTAL, CKMB, CKMBINDEX, TROPONINI in the last 168 hours.  HbA1C: Hemoglobin A1C  Date/Time Value Ref Range Status  10/12/2017 12:00 AM 9.7  Final   Hgb A1c MFr Bld  Date/Time Value Ref Range Status  05/22/2019 01:12 PM 11.2 (H) 4.8 - 5.6 % Final    Comment:             Prediabetes: 5.7 - 6.4          Diabetes: >6.4          Glycemic control for adults with diabetes: <7.0   11/27/2018 07:27 AM 11.3 (H) 4.8 - 5.6 % Final    Comment:    (NOTE)         Prediabetes: 5.7 - 6.4         Diabetes: >6.4         Glycemic control for adults with diabetes: <7.0     CBG: Recent Labs  Lab 09/14/19 1537 09/14/19 1912 09/14/19 2330 09/15/19 0335 09/15/19 0747  GLUCAP 222* 204* 173* 217* 250*    Critical care time:    Performed by: 11/15/19  Total critical care time: 35 minutes  Critical care time was exclusive of separately billable procedures and treating other patients.  Critical care was necessary to treat or prevent imminent or life-threatening deterioration.  Critical care was time spent personally by me on the following activities: development of  treatment plan with patient and/or surrogate as well as nursing, discussions with consultants, evaluation of patient's response to treatment, examination of patient, obtaining history from patient or surrogate, ordering and performing treatments and interventions, ordering and review of laboratory studies, ordering and review of radiographic studies, pulse oximetry and re-evaluation of patient's condition.  11/16/19, MD [facility] ICU Physician [facility] [place]  Pager: [phone] Mobile: [phone] After hours: [phone].  09/15/2019, 11:14 AM      09/15/2019, 11:06 AM

## 2019-09-16 ENCOUNTER — Inpatient Hospital Stay (HOSPITAL_COMMUNITY): Payer: Medicare HMO

## 2019-09-16 ENCOUNTER — Telehealth: Payer: Medicare HMO

## 2019-09-16 ENCOUNTER — Ambulatory Visit: Payer: Self-pay

## 2019-09-16 DIAGNOSIS — E11319 Type 2 diabetes mellitus with unspecified diabetic retinopathy without macular edema: Secondary | ICD-10-CM

## 2019-09-16 DIAGNOSIS — I10 Essential (primary) hypertension: Secondary | ICD-10-CM

## 2019-09-16 DIAGNOSIS — E785 Hyperlipidemia, unspecified: Secondary | ICD-10-CM

## 2019-09-16 DIAGNOSIS — R0902 Hypoxemia: Secondary | ICD-10-CM

## 2019-09-16 DIAGNOSIS — Z794 Long term (current) use of insulin: Secondary | ICD-10-CM

## 2019-09-16 DIAGNOSIS — A419 Sepsis, unspecified organism: Secondary | ICD-10-CM

## 2019-09-16 LAB — URINALYSIS, ROUTINE W REFLEX MICROSCOPIC
Bilirubin Urine: NEGATIVE
Glucose, UA: NEGATIVE mg/dL
Hgb urine dipstick: NEGATIVE
Ketones, ur: NEGATIVE mg/dL
Nitrite: NEGATIVE
Protein, ur: 30 mg/dL — AB
Specific Gravity, Urine: 1.021 (ref 1.005–1.030)
pH: 5 (ref 5.0–8.0)

## 2019-09-16 LAB — GLUCOSE, CAPILLARY
Glucose-Capillary: 173 mg/dL — ABNORMAL HIGH (ref 70–99)
Glucose-Capillary: 216 mg/dL — ABNORMAL HIGH (ref 70–99)
Glucose-Capillary: 214 mg/dL — ABNORMAL HIGH (ref 70–99)
Glucose-Capillary: 154 mg/dL — ABNORMAL HIGH (ref 70–99)
Glucose-Capillary: 90 mg/dL (ref 70–99)
Glucose-Capillary: 142 mg/dL — ABNORMAL HIGH (ref 70–99)

## 2019-09-16 MED ORDER — VANCOMYCIN HCL 1500 MG/300ML IV SOLN
1500.0000 mg | Freq: Once | INTRAVENOUS | Status: AC
  Administered 2019-09-16: 1500 mg via INTRAVENOUS
  Filled 2019-09-16: qty 300

## 2019-09-16 MED ORDER — PIPERACILLIN-TAZOBACTAM 3.375 G IVPB: 3.3750 g | Freq: Three times a day (TID) | INTRAVENOUS | Status: DC

## 2019-09-16 MED ORDER — SODIUM CHLORIDE 0.9 % IV SOLN: 1.0000 g | Freq: Two times a day (BID) | INTRAVENOUS | Status: DC

## 2019-09-16 MED ORDER — VANCOMYCIN HCL IN DEXTROSE 1-5 GM/200ML-% IV SOLN
1000.0000 mg | Freq: Two times a day (BID) | INTRAVENOUS | Status: DC
  Administered 2019-09-16 – 2019-09-17 (×3): 1000 mg via INTRAVENOUS
  Filled 2019-09-16 (×3): qty 200

## 2019-09-16 MED ORDER — FREE WATER
100.0000 mL | Status: DC
  Administered 2019-09-16 – 2019-09-18 (×14): 100 mL

## 2019-09-16 MED ORDER — VANCOMYCIN HCL IN DEXTROSE 1-5 GM/200ML-% IV SOLN: 1000.0000 mg | Freq: Two times a day (BID) | INTRAVENOUS | Status: DC

## 2019-09-16 MED ORDER — PIPERACILLIN-TAZOBACTAM 3.375 G IVPB 30 MIN
3.3750 g | Freq: Once | INTRAVENOUS | Status: AC
  Administered 2019-09-16: 3.375 g via INTRAVENOUS
  Filled 2019-09-16: qty 50

## 2019-09-16 MED ORDER — SODIUM CHLORIDE 0.9 % IV SOLN: 25.0000 mg | Freq: Once | INTRAVENOUS | Status: DC

## 2019-09-16 MED ORDER — LACTATED RINGERS IV BOLUS
1000.0000 mL | Freq: Once | INTRAVENOUS | Status: AC
  Administered 2019-09-16: 1000 mL via INTRAVENOUS

## 2019-09-16 MED ORDER — INSULIN GLARGINE 100 UNIT/ML ~~LOC~~ SOLN
34.0000 [IU] | Freq: Every day | SUBCUTANEOUS | Status: DC
  Administered 2019-09-16: 34 [IU] via SUBCUTANEOUS
  Filled 2019-09-16 (×2): qty 0.34

## 2019-09-16 MED ORDER — PIPERACILLIN-TAZOBACTAM 3.375 G IVPB
3.3750 g | Freq: Three times a day (TID) | INTRAVENOUS | Status: AC
  Administered 2019-09-16 – 2019-09-23 (×21): 3.375 g via INTRAVENOUS
  Filled 2019-09-16 (×22): qty 50

## 2019-09-16 NOTE — Progress Notes (Signed)
NAME:  Donna [name], MRN:  [id number], DOB:  [birth year], LOS: 22 ADMISSION DATE:  08/25/2019, CONSULTATION DATE:  08/27/2019 REFERRING MD:  Dr. 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  Admitted by Neuro IR on 6/14 for cerebral angiogram s/p RT ICA cervical/ petrous junction balloon angioplasty for severe stenosis via right femoral approach.  Was placed on ASA/ brillinta.  Monitored in Neuro ICU.  Noted to have developed hypotension, increased left sided weakness and lethargy on 6/15 am with Hgb drop from 13.3 to 6.9 with new AKI.  Post MRI/ MRA showed scattered small acute infarcts along the right MCA/ watershed territory suspected to be resultant either post procedure vs hypoperfusion.    A CT abd/ pelvis was obtained given concern for retroperitoneal bleed which showed a mild to moderate amount of retroperitoneal hematoma in the right iliac and inguinal regiones with moderate size hematoma noted the the soft tissues anterior to the musculature of the right hip; no definite intrapertioneal hemorrhage. INR, PT, and platelets rechecked and were wnl.  Vascular surgery was consulted with plans to observe given bleeding was mostly into the muscle and tissue.    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  7/4 Trached Consults:  NIR - primary Neurology (stroke has signed off 6/24) Vascular surgery   PCCM Palliative Care  Procedures:  6/14 cerebral angiogram s/p RT ICA cer/petrous junction balloon angioplasty for severe stenosis.    ETT 6/19 > 6/23,  6/23 > Trach 7/4  Significant Diagnostic Tests:   6/15 MRA/ MRI brain  > Scattered small acute infarcts along the right MCA/watershed territory. Solitary small acute infarct in the left parietal cortex and right cerebellum. Improved right ICA patency at the petrous segment. Known severe right M1 segment stenosis. 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  6/15 CT a/p wo contrast > Mild to moderate amount of retroperitoneal hematoma is noted in the right iliac and inguinal regions. Moderate size hematoma is noted in the soft tissues anterior to the musculature of the right hip. No definite intraperitoneal hemorrhage is noted. Moderate size fat containing periumbilical hernia. Aortic Atherosclerosis.   Echo 6/16 > normal LVEF  6/29 MRI brain > Progressed size and confluence of ischemia throughout much of the right hemisphere white matter since 08/26/2019. Associated petechial blood products but no malignant hemorrhagic transformation or mass Effect. There are also multiple new lacunar type infarcts elsewhere, including the brainstem and the left basal ganglia. Underlying chronic infarcts of the right basal ganglia and left cerebellum.  Micro Data:  6/10 SARS 2 >> neg 6/14 MRSA PCR >> neg 6/20 trach asp >> few diptheroids/ corynebacterium species 6/18 BC x 2 >> neg >>  Antimicrobials:  6/14 cefazolin pre-op  6/20 unasyn >> 09/02/2019 7/6 Vanc+ Zosyn>>  Interim history/subjective:  Patient became septic overnight, started getting febrile, tachycardic and tachypneic, x-ray chest was repeated this morning, consistent with right lower lobe pneumonia.  Lab work shows new leukocytosis.    Objective  Blood pressure (!) 141/59, pulse (!) 131, temperature (!) 101.2 F (38.4 C), temperature source Axillary, resp. rate (!) 31,  height 5\' 1"  (1.549 m), weight 84.7 kg, SpO2 100 %.    Vent Mode: PSV;CPAP FiO2 (%):  [40 %] 40 % Set Rate:  [18 bmp] 18 bmp Vt Set:  [410 mL] 410 mL PEEP:  [5 cmH20] 5 cmH20 Pressure Support:  [5 cmH20-10 cmH20] 10 cmH20 Plateau Pressure:  [16 cmH20-21 cmH20] 16 cmH20   Intake/Output Summary (Last 24 hours) at 09/16/2019 0932 Last data filed at 09/16/2019 0800 Gross per 24 hour  Intake 2037.36 ml  Output 1950 ml  Net 87.36 ml   Filed Weights   09/13/19 1033 09/14/19 0443 09/15/19 0500  Weight: 86.5 kg 84.7 kg 84.7 kg    Examination:  General: Acutely ill appearing deconditioned elderly female lying in bed on mechanical ventilation in NAD  HEENT: 6 cuffed shiley trach midline, MM pink/moist, PERRL, sclera non-icteric  Neuro: Alert with eyes spontaneously open follows commands weakly on the right.  Minimal movement on left upper extremity. Right gaze deviation. CV: Tachycardic with regular rhythm, no murmur, rubs, or gallops,  PULM: Diffuse rhonchi, crackles heard in right lower lung zone  GI: soft, bowel sounds active in all 4 quadrants, non-tender, non-distended, tolerating TF Extremities: warm/dry, generalized non-pitting edema  Skin: no rashes or lesions   Resolved Hospital Problem list   Hypotension  AKI  Assessment & Plan:   Right MCA CVA due to bilateral intracranial stenoses  -s/p RT ICA cer/petrous junction balloon angioplasty for severe stenosis c/b right thigh and retroperitoneal hematoma.  Persistent left-sided weakness. P: Supportive care  PT/OT as able  Nutrition and bowel regiment  Seizure precautions  Continue secondary stroke prevention  Avoid anticoagulation or antiplatelets given high risk for bleed  Sepsis due to right lower lobe pneumonia Patient started getting febrile, tachycardic and tachypneic, developed new leukocytosis.  X-ray chest repeated is consistent with right lower lobe infiltrates P: Bolus 1 L Ringer lactate Blood cultures sent x2,  UA is pending Started on IV vancomycin and Zosyn pending cultures specification  Acute respiratory failure with hypoxia requiring mechanical ventilation.  -Failed trial of extubation due to upper airway stridor.  Cords visibly scarred on reintubation. -S/P trach 7/3 P:  Trial of trach collar today.  Acute blood loss anemia  - Jehovah's Witness, no blood products including albumin. Hemoglobin has stabilized in the 4 range.  No evidence of active bleeding. P: Continue severe anemia protocol: Iron, vitamin C, vitamin B12, folic acid, and Epogen Limit blood draws  Supportive care-spoke to hematology who expects recovery to be very slow. Hemoglobin is slightly better today  Uncontrolled diabetes with hyperglycemia Patient blood sugars have been elevated about 200 range P: Increase Lantus to 34 units once daily Continue NovoLog insulin   Daily Goals Checklist  Pain/Anxiety/Delirium protocol (if indicated): Wean Precedex off VAP protocol (if indicated): Bundle in place Respiratory support goals: Daily SBT Blood pressure target: Keep systolic blood pressure less than 160 DVT prophylaxis: Mechanical DVT prophylaxis only given anemia and high bleeding risk. Nutrition Status: Tolerating tube feeds.  Continue tube feeds.  Attempt to see over the course of this week whether she would be able to swallow once off sedation.  Otherwise we will have to proceed with PEG tube. GI prophylaxis: Pantoprazole Fluid status goals: Allow autoregulation Urinary catheter: External catheter Central lines: PICC line Glucose control: Adequate glycemic control, type 2 diabetes. Mobility/therapy needs: PT OT Antibiotic de-escalation: No  antibiotics Home medication reconciliation: On hold Daily labs: CBC daily. Code Status: Full code. Family Communication: Son Evaristo Bury updated by Dr. Denese Killings at today. Disposition: ICU.  Labs   CBC: Recent Labs  Lab 09/10/19 1134 09/11/19 0412 09/13/19 0500 09/15/19 1211   WBC 6.9 7.0 6.2 14.9*  HGB 4.4* 4.4* 4.3* 4.9*  HCT 16.3* 16.3* 15.4* 17.5*  MCV 110.1* 110.1* 113.2* 110.1*  PLT 274 304 366 524*    Basic Metabolic Panel: Recent Labs  Lab 09/11/19 0412 09/13/19 0500 09/15/19 1211  NA 146* 147* 143  K 4.0 4.1 3.8  CL 111 111 106  CO2 30 28 27   GLUCOSE 214* 118* 262*  BUN 21 24* 27*  CREATININE 0.69 0.73 0.78  CALCIUM 8.5* 8.6* 8.5*   GFR: Estimated Creatinine Clearance: 66.5 mL/min (by C-G formula based on SCr of 0.78 mg/dL). Recent Labs  Lab 09/10/19 1134 09/11/19 0412 09/13/19 0500 09/15/19 1211  WBC 6.9 7.0 6.2 14.9*    Liver Function Tests: Recent Labs  Lab 09/11/19 0412 09/13/19 0500 09/15/19 1211  AST 23 23 20   ALT 28 24 26   ALKPHOS 51 55 72  BILITOT 0.7 0.6 0.8  PROT 6.0* 5.8* 6.7  ALBUMIN 1.7* 1.7* 1.7*   No results for input(s): LIPASE, AMYLASE in the last 168 hours. No results for input(s): AMMONIA in the last 168 hours.  ABG    Component Value Date/Time   PHART 7.407 09/03/2019 2106   PCO2ART 60.9 (H) 09/03/2019 2106   PO2ART 87 09/03/2019 2106   HCO3 38.0 (H) 09/03/2019 2106   TCO2 40 (H) 09/03/2019 2106   ACIDBASEDEF 1.0 08/28/2019 0505   O2SAT 96.0 09/03/2019 2106     Coagulation Profile: No results for input(s): INR, PROTIME in the last 168 hours.  Cardiac Enzymes: No results for input(s): CKTOTAL, CKMB, CKMBINDEX, TROPONINI in the last 168 hours.  HbA1C: Hemoglobin A1C  Date/Time Value Ref Range Status  10/12/2017 12:00 AM 9.7  Final   Hgb A1c MFr Bld  Date/Time Value Ref Range Status  05/22/2019 01:12 PM 11.2 (H) 4.8 - 5.6 % Final    Comment:             Prediabetes: 5.7 - 6.4          Diabetes: >6.4          Glycemic control for adults with diabetes: <7.0   11/27/2018 07:27 AM 11.3 (H) 4.8 - 5.6 % Final    Comment:    (NOTE)         Prediabetes: 5.7 - 6.4         Diabetes: >6.4         Glycemic control for adults with diabetes: <7.0     CBG: Recent Labs  Lab  09/15/19 1519 09/15/19 1911 09/15/19 2308 09/16/19 0301 09/16/19 0817  GLUCAP 208* 181* 216* 173* 216*    Critical care time:    Performed by: 11/16/19  Total critical care time: 37 minutes  Critical care time was exclusive of separately billable procedures and treating other patients.  Critical care was necessary to treat or prevent imminent or life-threatening deterioration.  Critical care was time spent personally by me on the following activities: development of treatment plan with patient and/or surrogate as well as nursing, discussions with consultants, evaluation of patient's response to treatment, examination of patient, obtaining history from patient or surrogate, ordering and performing treatments and interventions, ordering and review of laboratory studies, ordering and review of radiographic studies, pulse  oximetry and re-evaluation of patient's condition.  Cheri FowlerSudham Jasan Doughtie MD Critical care physician [facility]  Pager: [phone] Mobile: [phone]   09/16/2019, 9:32 AM      09/16/2019, 9:32 AM

## 2019-09-16 NOTE — Progress Notes (Signed)
Orthopedic Tech Progress Note Patient Details:  Donna [name] 10-28-1950 [id number] Called in order to [employer] for a RESTING HAND SPLINT to the left. Thumb strap is broken Patient ID: Donna [name], female   DOB: [birth year], 69 y.o.   MRN: [id number]   Donna [name] 09/16/2019, 9:48 AM

## 2019-09-16 NOTE — Progress Notes (Signed)
Referring Physician(s): Dr. Jaynee Eagles  Supervising Physician: Luanne Bras  Patient Status:  [facility] - In-pt  Chief Complaint: Stroke  Subjective: Patient now with fever and elevated WBC. Minimally follows commands. Flicker of movement for right hand grip. No left-sided movement this morning.   Allergies: Patient has no known allergies.  Medications: Prior to Admission medications   Medication Sig Start Date End Date Taking? Authorizing Provider  amLODipine (NORVASC) 10 MG tablet Take 1 tablet (10 mg total) by mouth daily. 05/12/19  Yes Minette Brine, FNP  aspirin EC 81 MG tablet Take 81 mg by mouth every evening.   Yes [provider]  Calcium Carbonate-Vitamin D (CALCIUM-D PO) Take 2 tablets by mouth daily at 12 noon.    Yes [provider]  carvedilol (COREG) 6.25 MG tablet TAKE 1 TABLET(6.25 MG) BY MOUTH TWICE DAILY Patient taking differently: Take 6.25 mg by mouth 2 (two) times daily with a meal.  07/31/19  Yes Minette Brine, FNP  hydrALAZINE (APRESOLINE) 50 MG tablet Take 1 tablet (50 mg total) by mouth 2 (two) times daily. 06/16/19 09/14/19 Yes Elouise Munroe, MD  Insulin Degludec-Liraglutide (XULTOPHY) 100-3.6 UNIT-MG/ML SOPN Inject 30 Units into the skin daily. 08/14/19  Yes Minette Brine, FNP  telmisartan-hydrochlorothiazide (MICARDIS HCT) 40-12.5 MG tablet Take 1 tablet by mouth daily. 07/31/19  Yes Minette Brine, FNP  ticagrelor (BRILINTA) 90 MG TABS tablet Take 1 tablet (90 mg total) by mouth 2 (two) times daily. 06/12/19  Yes Elouise Munroe, MD  vitamin C (ASCORBIC ACID) 250 MG tablet Take 500 mg by mouth daily at 12 noon.   Yes [provider]  blood glucose meter kit and supplies KIT Dispense based on patient and insurance preference. Use up to four times daily as directed. (FOR ICD-9 250.00, 250.01). 05/10/18   Minette Brine, FNP  Blood Glucose Monitoring Suppl (TRUE METRIX METER) w/Device KIT 1 each by Does not apply route in the morning, at  noon, in the evening, and at bedtime. 05/22/19   Minette Brine, FNP  Evolocumab (REPATHA SURECLICK) 657 MG/ML SOAJ Inject 140 mg into the skin every 14 (fourteen) days. 06/30/19   Elouise Munroe, MD  glucose blood (TRUE METRIX BLOOD GLUCOSE TEST) test strip Check blood sugar 4 times a day before meals and bedtime 05/22/19   Minette Brine, FNP  Insulin Glargine-Lixisenatide (SOLIQUA) 100-33 UNT-MCG/ML SOPN Inject 25 Units into the skin daily. Patient taking differently: Inject 30 Units into the skin daily.  07/14/19   Minette Brine, FNP  Multiple Vitamin (MULTIVITAMIN PO) Take 1 tablet by mouth daily at 12 noon.     [provider]  Omega-3 Fatty Acids (OMEGA-3 PLUS PO) Take 2 capsules by mouth daily at 12 noon.    [provider]     Vital Signs: BP (!) 141/59   Pulse (!) 131   Temp (!) 101.2 F (38.4 C) (Axillary)   Resp (!) 31   Ht _0  (1.549 m)   Wt 186 lb 11.7 oz (84.7 kg)   SpO2 100%   BMI 35.28 kg/m   Physical Exam Constitutional:      General: She is not in acute distress.    Comments: Tracheostomy, sedation. Opens eyes to voice.   Cardiovascular:     Rate and Rhythm: Normal rate and regular rhythm.  Pulmonary:     Effort: Pulmonary effort is normal.     Comments: Ventilator Skin:    General: Skin is warm and dry.  Comments: Right groin puncture site soft without active bleeding or hematoma. Right radial puncture site soft without active bleeding or hematoma.   Neurological:     Comments: Tracheostomy in place, on sedation. Opens eyes to voice. Less responsive today. Minimally follows simple commands. Spontaneous movements of right side per bedside RN. On our exam right upper extremity 2/5. No spontaneous movements of the left side.        Imaging: DG CHEST PORT 1 VIEW  Result Date: 09/16/2019 CLINICAL DATA:  Sepsis EXAM: PORTABLE CHEST 1 VIEW COMPARISON:  September 13, 2019 FINDINGS: Tracheostomy catheter tip is 4.9 cm above the carina. Feeding tube  tip is below the diaphragm. Central catheter tip is in the superior vena cava. No pneumothorax. There is mild bibasilar atelectasis. The lungs elsewhere are clear. Heart is upper normal in size with pulmonary vascularity normal. No adenopathy. There is aortic atherosclerosis. No bone lesions. There is calcification in the carotid arteries. IMPRESSION: Tube and catheter positions as described without pneumothorax. Slight bibasilar atelectasis. No edema or airspace opacity. Stable cardiac silhouette. Aortic Atherosclerosis (ICD10-I70.0). There is also bilateral carotid artery calcification. Electronically Signed   By: Lowella Grip III M.D.   On: 09/16/2019 08:14   DG Chest Port 1 View  Result Date: 09/13/2019 CLINICAL DATA:  Status post tracheostomy placement. EXAM: PORTABLE CHEST 1 VIEW COMPARISON:  Single-view of the chest 09/08/2019. FINDINGS: Right PICC and feeding tube remain in place. New tracheostomy tube is seen with the tip in good position at the level the clavicular heads. Support apparatus overlies the left upper chest but there appears to be airspace disease in the left upper lobe. Right lung clear. Heart size normal. Atherosclerosis. IMPRESSION: Tracheostomy tube in good position. Findings worrisome for left upper lobe pneumonia although support apparatus overlies the area of interest. Electronically Signed   By: Inge Rise M.D.   On: 09/13/2019 16:39    Labs:  CBC: Recent Labs    09/10/19 1134 09/11/19 0412 09/13/19 0500 09/15/19 1211  WBC 6.9 7.0 6.2 14.9*  HGB 4.4* 4.4* 4.3* 4.9*  HCT 16.3* 16.3* 15.4* 17.5*  PLT 274 304 366 524*    COAGS: Recent Labs    11/27/18 0728 08/18/19 0705 08/26/19 1715 08/27/19 0230  INR 0.9 0.9 1.1 1.2    BMP: Recent Labs    09/09/19 0525 09/11/19 0412 09/13/19 0500 09/15/19 1211  NA 146* 146* 147* 143  K 3.8 4.0 4.1 3.8  CL 110 111 111 106  CO2 _0 GLUCOSE 170* 214* 118* 262*  BUN 24* 21 24* 27*  CALCIUM 8.4*  8.5* 8.6* 8.5*  CREATININE 0.76 0.69 0.73 0.78  GFRNONAA >60 >60 >60 >60  GFRAA >60 >60 >60 >60    LIVER FUNCTION TESTS: Recent Labs    09/09/19 0525 09/11/19 0412 09/13/19 0500 09/15/19 1211  BILITOT 0.4 0.7 0.6 0.8  AST _1 ALT _2 ALKPHOS 49 51 55 72  PROT 5.8* 6.0* 5.8* 6.7  ALBUMIN 1.7* 1.7* 1.7* 1.7*    Assessment and Plan: Right ICA cervical/petrous junction stenosis s/p revascularization using balloon angioplasty via right radial and right femoral approach 08/25/19 by Dr. Estanislado Pandy. Right MCA CVA/left lacunar infarcts. Mild-moderate right thigh (with some retroperitoneal component) hematoma. Acute blood loss anemia; Jehovah's Witness.   S/P tracheostomy 09/13/19; AC held. Current Hgb 4.9. Current WBC 14.9. Possible gastrostomy tube depending on stability of hemoglobin and assessment of swallowing once off sedation. Further plans  per CCM. IR will continue to follow.   Electronically Signed: Soyla Dryer, AGACNP-BC [phone] 09/16/2019, 8:50 AM   I spent a total of 15 Minutes at the the patient's bedside AND on the patient's hospital floor or unit, greater than 50% of which was counseling/coordinating care for right ICA stenosis s/p revascularization

## 2019-09-16 NOTE — Progress Notes (Signed)
Pharmacy Antibiotic Note  Donna [name] is a 69 y.o. female admitted on 08/25/2019, now with PNA.  Pharmacy has been consulted for vancomycin dosing. Also started on Zosyn per MD. Tmax/24h 101.2, WBC up to 14.9. SCr stable 0.78 (currently drawing labs every other day).  Plan: Zosyn 3.375g IV ( infusion) x1; then 3.375g IV q8h (4h infusion) Vancomycin 1500mg  IV x 1; then 1g IV q12h Monitor clinical progress, c/s, renal function F/u de-escalation plan/LOT, vancomycin levels as indicated   Height: 5\' 1"  (154.9 cm) Weight: 84.7 kg (186 lb 11.7 oz) IBW/kg (Calculated) : 47.8  Temp (24hrs), Avg:99.8 F (37.7 C), Min:98.7 F (37.1 C), Max:101.2 F (38.4 C)  Recent Labs  Lab 09/10/19 1134 09/11/19 0412 09/13/19 0500 09/15/19 1211  WBC 6.9 7.0 6.2 14.9*  CREATININE  --  0.69 0.73 0.78    Estimated Creatinine Clearance: 66.5 mL/min (by C-G formula based on SCr of 0.78 mg/dL).    No Known Allergies  Antimicrobials this admission: Unasyn 6/20 >> 6/22 Zosyn 7/6 >> Vancomycin 7/6 >>  Dose adjustments this admission:   Microbiology results: 6/18 BCx - negative 6/18 MRSA PCR - negative 6/20 TA - few diphtheroids 7/4 TA - rare GPR, GPC in pairs   7/18, PharmD, BCPS Please check AMION for all [facility] Pharmacy contact numbers Clinical Pharmacist 09/16/2019 8:42 AM

## 2019-09-16 NOTE — Progress Notes (Signed)
Inpatient Diabetes Program Recommendations  AACE/ADA: New Consensus Statement on Inpatient Glycemic Control (2015)  Target Ranges:  Prepandial:   less than 140 mg/dL      Peak postprandial:   less than 180 mg/dL (1-2 hours)      Critically ill patients:  140 - 180 mg/dL   Lab Results  Component Value Date   GLUCAP 216 (H) 09/16/2019   HGBA1C 11.2 (H) 05/22/2019    Review of Glycemic Control Results for ADALAI, PERL (MRN [id number]) as of 09/16/2019 11:23  Ref. Range 09/15/2019 07:47 09/15/2019 11:22 09/15/2019 15:19 09/15/2019 19:11 09/15/2019 23:08 09/16/2019 03:01 09/16/2019 08:17  Glucose-Capillary Latest Ref Range: 70 - 99 mg/dL 045 (H)  Novolog 9 units  Lantus 30 212 (H)  Novolog 9 units 208 (H)  Novolog 9 units 181 (H)  Novolog 7 units 216 (H)  Novolog 9 units  173 (H)  Novolog 7 units 216 (H)  Novolog 9 units  Lantus 34 units   Diabetes history: DM 2 Outpatient Diabetes medications: Soliqua 30 units Current orders for Inpatient glycemic control:  Lantus 34 units Novolog 0-15 units Q4 hours Novolog 4 units Q4 hours Tube Feed Coverage  Inpatient Diabetes Program Recommendations:    Noted Lantus increased to 34 units this am.  -Consider increasing Novolog Tube Feed Coverage to 6 units Q4 hours.  Thanks,  Christena Deem RN, MSN, BC-ADM Inpatient Diabetes Coordinator Team Pager [phone] (8a-5p)

## 2019-09-16 NOTE — Plan of Care (Signed)
  Problem: Nutrition: Goal: Adequate nutrition will be maintained Outcome: Progressing   Problem: Elimination: Goal: Will not experience complications related to bowel motility Outcome: Progressing Goal: Will not experience complications related to urinary retention Outcome: Progressing   Problem: Safety: Goal: Ability to remain free from injury will improve Outcome: Progressing   Problem: Activity: Goal: Risk for activity intolerance will decrease Outcome: Not Progressing   Problem: Health Behavior/Discharge Planning: Goal: Ability to manage health-related needs will improve Outcome: Not Progressing   Problem: Self-Care: Goal: Ability to participate in self-care as condition permits will improve Outcome: Not Progressing

## 2019-09-16 NOTE — Chronic Care Management (AMB) (Signed)
Chronic Care Management   Follow Up Note   09/16/2019 Name: Donna [name] MRN: [id number] DOB: Dec 05, 1950  Referred by: Minette Brine, FNP Reason for referral : Chronic Care Management (FU RN CCM IP status )   Donna [name] is a 69 y.o. year old female who is a primary care patient of Minette Brine, [place]. The CCM team was consulted for assistance with chronic disease management and care coordination needs.    Review of patient status, including review of consultants reports, relevant laboratory and other test results, and collaboration with appropriate care team members and the patient's provider was performed as part of comprehensive patient evaluation and provision of chronic care management services.    SDOH (Social Determinants of Health) assessments performed: No See Care Plan activities for detailed interventions related to [facility])   Reviewed chart in preparation to contact patient. 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  Persistent left-sided weakness. P: Supportive care  PT/OT as able  Nutrition and bowel regiment  Seizure precautions  Continue secondary stroke prevention  Avoid anticoagulation or antiplatelets given high risk for bleed  Sepsis due to right lower lobe pneumonia Patient started getting febrile, tachycardic and tachypneic, developed new leukocytosis.  X-ray chest repeated is consistent with right lower lobe infiltrates P: Bolus 1 L Ringer lactate Blood cultures sent x2, UA is pending Started on IV vancomycin and Zosyn pending cultures specification  Acute respiratory failure with hypoxia requiring mechanical ventilation.  -Failed trial of extubation due to upper airway stridor.  Cords visibly scarred on reintubation. -S/P  trach 7/3 P:  Trial of trach collar today.  Acute blood loss anemia  - Jehovah's Witness, no blood products including albumin. Hemoglobin has stabilized in the 4 range.  No evidence of active bleeding. P: Continue severe anemia protocol: Iron, vitamin C, vitamin T88, folic acid, and Epogen Limit blood draws  Supportive care-spoke to hematology who expects recovery to be very slow. Hemoglobin is slightly better today  Uncontrolled diabetes with hyperglycemia Patient blood sugars have been elevated about 200 range P: Increase Lantus to 34 units once daily Continue NovoLog insulin    Facility-Administered Encounter Medications as of 09/16/2019  Medication  . 0.9 %  sodium chloride infusion  . acetaminophen (TYLENOL) tablet 650 mg   Or  . acetaminophen (TYLENOL) 160 MG/5ML solution 650 mg   Or  . acetaminophen (TYLENOL) suppository 650 mg  . albuterol (PROVENTIL) (2.5 MG/3ML) 0.083% nebulizer solution 2.5 mg  . chlorhexidine (PERIDEX) 0.12 % solution 15 mL  . Chlorhexidine Gluconate Cloth 2 % PADS 6 each  . cloNIDine (CATAPRES) tablet 0.1 mg  . Darbepoetin Alfa (ARANESP) injection 60 mcg  . dexmedetomidine (PRECEDEX) 400 MCG/100ML (4 mcg/mL) infusion  . docusate (COLACE) 50 MG/5ML liquid 100 mg  . famotidine (PEPCID) tablet 20 mg  . feeding supplement (PRO-STAT SUGAR FREE 64) liquid 30 mL  . feeding supplement (VITAL AF 1.2 CAL) liquid 1,000 mL  . fentaNYL (SUBLIMAZE) injection 25-100 mcg  . ferric gluconate (NULECIT) 125 mg in sodium chloride 0.9 % 100 mL IVPB  . folic acid (FOLVITE) tablet 1 mg  . free water 100 mL  . guaiFENesin (ROBITUSSIN) 100 MG/5ML solution 200 mg  . insulin aspart (novoLOG) injection 0-15 Units  . insulin aspart (novoLOG) injection 4 Units  . insulin  glargine (LANTUS) injection 34 Units  . ipratropium-albuterol (DUONEB) 0.5-2.5 (3) MG/3ML nebulizer solution 3 mL  . labetalol (NORMODYNE) injection 10 mg  . MEDLINE mouth rinse  .  piperacillin-tazobactam (ZOSYN) IVPB 3.375 g  . sodium chloride flush (NS) 0.9 % injection 10-40 mL  . sodium chloride flush (NS) 0.9 % injection 10-40 mL  . vancomycin (VANCOCIN) IVPB 1000 mg/200 mL premix  . vancomycin (VANCOREADY) IVPB 1500 mg/300 mL  . vitamin B-12 (CYANOCOBALAMIN) tablet 1,000 mcg   Outpatient Encounter Medications as of 09/16/2019  Medication Sig Note  . amLODipine (NORVASC) 10 MG tablet Take 1 tablet (10 mg total) by mouth daily.   Marland Kitchen aspirin EC 81 MG tablet Take 81 mg by mouth every evening.   . blood glucose meter kit and supplies KIT Dispense based on patient and insurance preference. Use up to four times daily as directed. (FOR ICD-9 250.00, 250.01). 06/13/2018: Bought ReliON meter from [employer]    . Blood Glucose Monitoring Suppl (TRUE METRIX METER) w/Device KIT 1 each by Does not apply route in the morning, at noon, in the evening, and at bedtime.   . Calcium Carbonate-Vitamin D (CALCIUM-D PO) Take 2 tablets by mouth daily at 12 noon.    . carvedilol (COREG) 6.25 MG tablet TAKE 1 TABLET(6.25 MG) BY MOUTH TWICE DAILY (Patient taking differently: Take 6.25 mg by mouth 2 (two) times daily with a meal. )   . Evolocumab (REPATHA SURECLICK) 909 MG/ML SOAJ Inject 140 mg into the skin every 14 (fourteen) days. 08/06/2019: Pt has not started med   . glucose blood (TRUE METRIX BLOOD GLUCOSE TEST) test strip Check blood sugar 4 times a day before meals and bedtime   . hydrALAZINE (APRESOLINE) 50 MG tablet Take 1 tablet (50 mg total) by mouth 2 (two) times daily.   . Insulin Degludec-Liraglutide (XULTOPHY) 100-3.6 UNIT-MG/ML SOPN Inject 30 Units into the skin daily.   . Insulin Glargine-Lixisenatide (SOLIQUA) 100-33 UNT-MCG/ML SOPN Inject 25 Units into the skin daily. (Patient taking differently: Inject 30 Units into the skin daily. )   . Multiple Vitamin (MULTIVITAMIN PO) Take 1 tablet by mouth daily at 12 noon.    . Omega-3 Fatty Acids (OMEGA-3 PLUS PO) Take 2 capsules by mouth  daily at 12 noon.   Marland Kitchen telmisartan-hydrochlorothiazide (MICARDIS HCT) 40-12.5 MG tablet Take 1 tablet by mouth daily.   . ticagrelor (BRILINTA) 90 MG TABS tablet Take 1 tablet (90 mg total) by mouth 2 (two) times daily.   . vitamin C (ASCORBIC ACID) 250 MG tablet Take 500 mg by mouth daily at 12 noon.      Objective: Lab Results  Component Value Date   HGBA1C 11.2 (H) 05/22/2019   HGBA1C 11.3 (H) 11/27/2018   HGBA1C 12.3 (H) 10/07/2018   Lab Results  Component Value Date   MICROALBUR 80 01/08/2019   LDLCALC 132 (H) 05/22/2019   CREATININE 0.78 09/15/2019   BP Readings from Last 3 Encounters:  09/16/19 (!) 152/103  08/18/19 (!) 120/56  08/18/19 (!) 150/70    Plan:   CCM RN CM will continue to monitor IP status and will contact this patient to assess for CCM needs when appropriate.   Barb Merino, RN, BSN, CCM Care Management Coordinator [place] [facility]  Direct Phone: [phone]

## 2019-09-17 LAB — CULTURE, RESPIRATORY W GRAM STAIN

## 2019-09-17 LAB — COMPREHENSIVE METABOLIC PANEL
ALT: 23 U/L (ref 0–44)
AST: 32 U/L (ref 15–41)
Albumin: 1.6 g/dL — ABNORMAL LOW (ref 3.5–5.0)
Alkaline Phosphatase: 82 U/L (ref 38–126)
Anion gap: 13 (ref 5–15)
BUN: 38 mg/dL — ABNORMAL HIGH (ref 8–23)
CO2: 26 mmol/L (ref 22–32)
Calcium: 8.7 mg/dL — ABNORMAL LOW (ref 8.9–10.3)
Chloride: 103 mmol/L (ref 98–111)
Creatinine, Ser: 0.84 mg/dL (ref 0.44–1.00)
GFR calc Af Amer: >60 mL/min (ref >60)
GFR calc non Af Amer: >60 mL/min (ref >60)
Glucose, Bld: 95 mg/dL (ref 70–99)
Potassium: 4 mmol/L (ref 3.5–5.1)
Sodium: 142 mmol/L (ref 135–145)
Total Bilirubin: 1.1 mg/dL (ref 0.3–1.2)
Total Protein: 6.6 g/dL (ref 6.5–8.1)

## 2019-09-17 LAB — CBC
HCT: 19 % — ABNORMAL LOW (ref 36.0–46.0)
Hemoglobin: 5.6 g/dL — CL (ref 12.0–15.0)
MCH: 30.9 pg (ref 26.0–34.0)
MCHC: 29.5 g/dL — ABNORMAL LOW (ref 30.0–36.0)
MCV: 105 fL — ABNORMAL HIGH (ref 80.0–100.0)
Platelets: 448 10*3/uL — ABNORMAL HIGH (ref 150–400)
RBC: 1.81 MIL/uL — ABNORMAL LOW (ref 3.87–5.11)
RDW: 20.4 % — ABNORMAL HIGH (ref 11.5–15.5)
WBC: 17.5 10*3/uL — ABNORMAL HIGH (ref 4.0–10.5)
nRBC: 1.3 % — ABNORMAL HIGH (ref 0.0–0.2)

## 2019-09-17 LAB — GLUCOSE, CAPILLARY
Glucose-Capillary: 104 mg/dL — ABNORMAL HIGH (ref 70–99)
Glucose-Capillary: 91 mg/dL (ref 70–99)
Glucose-Capillary: 129 mg/dL — ABNORMAL HIGH (ref 70–99)
Glucose-Capillary: 169 mg/dL — ABNORMAL HIGH (ref 70–99)
Glucose-Capillary: 133 mg/dL — ABNORMAL HIGH (ref 70–99)
Glucose-Capillary: 156 mg/dL — ABNORMAL HIGH (ref 70–99)

## 2019-09-17 LAB — PATHOLOGIST SMEAR REVIEW

## 2019-09-17 MED ORDER — INSULIN GLARGINE 100 UNIT/ML ~~LOC~~ SOLN
32.0000 [IU] | Freq: Every day | SUBCUTANEOUS | Status: DC
  Administered 2019-09-17 – 2019-09-23 (×7): 32 [IU] via SUBCUTANEOUS
  Filled 2019-09-17 (×9): qty 0.32

## 2019-09-17 MED ORDER — ATORVASTATIN CALCIUM 40 MG PO TABS
40.0000 mg | ORAL_TABLET | Freq: Every day | ORAL | Status: DC
  Administered 2019-09-17 – 2019-11-01 (×44): 40 mg via NASOGASTRIC
  Filled 2019-09-17 (×46): qty 1

## 2019-09-17 NOTE — Progress Notes (Signed)
SLP Cancellation Note  Patient Details Name: Donna [name] [facility] MRN: [id number] DOB: 07/11/1950   Cancelled treatment:       Reason Eval/Treat Not Completed: Medical issues which prohibited therapy. Per RN pt attempted TC briefly this morning but is now back on the vent. She has a lot of secretions and will follow some commands, but not making attempts to talk so far today. Given current presentation, will hold PMV trials pending either improvements in mentation/secretions or ability to transition to [facility].    Mahala Menghini., M.A. CCC-SLP Acute Rehabilitation Services Pager [phone] Office [phone]  09/17/2019, 9:38 AM

## 2019-09-17 NOTE — Progress Notes (Signed)
Physical Therapy Treatment Patient Details Name: Donna [name] MRN: [id number] DOB: 1951/01/28 Today's Date: 09/17/2019    History of Present Illness 69 y.o. female with PMH significant for HTN, HLD, CVA 06/2018, DM, anemia, obesity, OSA, and OA. She is known to [facility] and has been followed by Dr. Corliss Skains since 10/2018. She first presented to our department at the request of Dr. Lucia Gaskins for management of intracranial stenosis, thought to be cause of recent CVA at that time. Pt underwent R ICA angioplasty on 6/14. Pt with decline in neuro status and hemoglobin on 6/15, found to have a large R groin hematoma.  Pt has had Hbg 5.0 or less and is a Jehovah's witness.  She was intubated on 6/19. Pt underwent tracheostomy on 7/3 and remains ventilated. Pt found to be septic with RLL PNA on 09/16/2019    PT Comments    Pt limited this session due to fatigue, lethargy, not consistently following commands at this time. Pt does follow through with some RUE movement after PT initiation, but this quickly subsides and pt begins to fall asleep again. Pt does intermittently become more alert with mobility and verbal cues but is unable to communicate with PT at this time and quickly falls back asleep when resting. Pt continues to require totalA for all functional mobility at this time. PT reducing frequency to 2x/week until the pt demonstrates the ability to more consistently follow commands and participate in PT session. PT continues to recommend SNF vs LTACH pending medical appropriateness.   Follow Up Recommendations  SNF     Equipment Recommendations  Wheelchair (measurements PT);Wheelchair cushion (measurements PT);Hospital bed    Recommendations for Other Services       Precautions / Restrictions Precautions Precautions: Fall Precaution Comments: trach to vent, low Hgb Restrictions Weight Bearing Restrictions: No    Mobility  Bed Mobility Overal bed mobility: Needs Assistance Bed Mobility: Supine to  Sit;Sit to Supine     Supine to sit: Total assist;HOB elevated Sit to supine: Total assist;HOB elevated      Transfers                    Ambulation/Gait                 Stairs             Wheelchair Mobility    Modified Rankin (Stroke Patients Only)       Balance Overall balance assessment: Needs assistance Sitting-balance support: No upper extremity supported;Feet unsupported Sitting balance-Leahy Scale: Zero Sitting balance - Comments: totalA to maintain sitting balance, posterior lean Postural control: Posterior lean                                  Cognition Arousal/Alertness: Lethargic Behavior During Therapy: Flat affect Overall Cognitive Status: Difficult to assess                         Following Commands: Follows one step commands inconsistently              Exercises Other Exercises Other Exercises: PROM bilateral ankle, knee, hip flexion and extension for 10 reps PROM Other Exercises: PROM bilateral shoulder flexion/abduction, elbow flexion/extension, wrist flexion/extension and composite digit flexion/extension for 10 reps    General Comments General comments (skin integrity, edema, etc.): VSS on trach to vent, 30% FiO2, 5 PEEP.  Pertinent Vitals/Pain Pain Assessment: Faces Faces Pain Scale: Hurts a little bit Pain Location: generalized with ROM Pain Descriptors / Indicators: Grimacing Pain Intervention(s): Monitored during session    Home Living                      Prior Function            PT Goals (current goals can now be found in the care plan section) Acute Rehab PT Goals Patient Stated Goal: pt unable to state, PT goal to improve bed mobility and transfer quality Progress towards PT goals: Not progressing toward goals - comment    Frequency    Min 2X/week      PT Plan Frequency needs to be updated    Co-evaluation              AM-PAC PT "6 Clicks"  Mobility   Outcome Measure  Help needed turning from your back to your side while in a flat bed without using bedrails?: Total Help needed moving from lying on your back to sitting on the side of a flat bed without using bedrails?: Total Help needed moving to and from a bed to a chair (including a wheelchair)?: Total Help needed standing up from a chair using your arms (e.g., wheelchair or bedside chair)?: Total Help needed to walk in hospital room?: Total Help needed climbing 3-5 steps with a railing? : Total 6 Click Score: 6    End of Session Equipment Utilized During Treatment: Oxygen Activity Tolerance: Patient limited by fatigue Patient left: in bed;with call bell/phone within reach;with bed alarm set Nurse Communication: Mobility status;Need for lift equipment PT Visit Diagnosis: Other abnormalities of gait and mobility (R26.89);Muscle weakness (generalized) (M62.81);Other symptoms and signs involving the nervous system (R29.898);Hemiplegia and hemiparesis Hemiplegia - Right/Left: Left Hemiplegia - caused by: Cerebral infarction     Time: 1245-8099 PT Time Calculation (min) (ACUTE ONLY): 24 min  Charges:  $Therapeutic Exercise: 8-22 mins $Therapeutic Activity: 8-22 mins                     Arlyss Gandy, PT, DPT Acute Rehabilitation Pager: [phone]    Arlyss Gandy 09/17/2019, 4:47 PM

## 2019-09-17 NOTE — Progress Notes (Signed)
Referring Physician(s): Dr. Jaynee Eagles  Supervising Physician: Luanne Bras  Patient Status:  [facility] - In-pt  Chief Complaint: Stroke  Subjective: Patient mostly unchanged from yesterday. Patient able to follow commands and wiggle right toes and slightly move right hand. No movement on the left. She opened her eyes to voice.   Allergies: Patient has no known allergies.  Medications: Prior to Admission medications   Medication Sig Start Date End Date Taking? Authorizing Provider  amLODipine (NORVASC) 10 MG tablet Take 1 tablet (10 mg total) by mouth daily. 05/12/19  Yes Minette Brine, FNP  aspirin EC 81 MG tablet Take 81 mg by mouth every evening.   Yes [provider]  Calcium Carbonate-Vitamin D (CALCIUM-D PO) Take 2 tablets by mouth daily at 12 noon.    Yes [provider]  carvedilol (COREG) 6.25 MG tablet TAKE 1 TABLET(6.25 MG) BY MOUTH TWICE DAILY Patient taking differently: Take 6.25 mg by mouth 2 (two) times daily with a meal.  07/31/19  Yes Minette Brine, FNP  hydrALAZINE (APRESOLINE) 50 MG tablet Take 1 tablet (50 mg total) by mouth 2 (two) times daily. 06/16/19 09/14/19 Yes Elouise Munroe, MD  Insulin Degludec-Liraglutide (XULTOPHY) 100-3.6 UNIT-MG/ML SOPN Inject 30 Units into the skin daily. 08/14/19  Yes Minette Brine, FNP  telmisartan-hydrochlorothiazide (MICARDIS HCT) 40-12.5 MG tablet Take 1 tablet by mouth daily. 07/31/19  Yes Minette Brine, FNP  ticagrelor (BRILINTA) 90 MG TABS tablet Take 1 tablet (90 mg total) by mouth 2 (two) times daily. 06/12/19  Yes Elouise Munroe, MD  vitamin C (ASCORBIC ACID) 250 MG tablet Take 500 mg by mouth daily at 12 noon.   Yes [provider]  blood glucose meter kit and supplies KIT Dispense based on patient and insurance preference. Use up to four times daily as directed. (FOR ICD-9 250.00, 250.01). 05/10/18   Minette Brine, FNP  Blood Glucose Monitoring Suppl (TRUE METRIX METER) w/Device KIT 1 each by Does not  apply route in the morning, at noon, in the evening, and at bedtime. 05/22/19   Minette Brine, FNP  Evolocumab (REPATHA SURECLICK) 201 MG/ML SOAJ Inject 140 mg into the skin every 14 (fourteen) days. 06/30/19   Elouise Munroe, MD  glucose blood (TRUE METRIX BLOOD GLUCOSE TEST) test strip Check blood sugar 4 times a day before meals and bedtime 05/22/19   Minette Brine, FNP  Insulin Glargine-Lixisenatide (SOLIQUA) 100-33 UNT-MCG/ML SOPN Inject 25 Units into the skin daily. Patient taking differently: Inject 30 Units into the skin daily.  07/14/19   Minette Brine, FNP  Multiple Vitamin (MULTIVITAMIN PO) Take 1 tablet by mouth daily at 12 noon.     [provider]  Omega-3 Fatty Acids (OMEGA-3 PLUS PO) Take 2 capsules by mouth daily at 12 noon.    [provider]     Vital Signs: BP 127/82 (BP Location: Left Arm)   Pulse (!) 130   Temp (!) 100.9 F (38.3 C) (Axillary)   Resp (!) 24   Ht 5' 1"  (1.549 m)   Wt 186 lb 11.7 oz (84.7 kg)   SpO2 100%   BMI 35.28 kg/m   Physical Exam Constitutional:      Comments: Opens eyes to voice; sedated with Precedex.  HENT:     Mouth/Throat:     Mouth: Mucous membranes are moist.  Cardiovascular:     Rate and Rhythm: Normal rate and regular rhythm.  Pulmonary:     Effort: Pulmonary effort is normal.  Comments: Tracheostomy; ventilator  Skin:    General: Skin is warm and dry.     Comments: Right groin puncture site soft without active bleeding or hematoma. Right radial puncture site soft without active bleeding or hematoma.  Neurological:     Comments: Tracheostomy in place, on sedation. Opens eyes to voice. Minimally follows simple commands. Right hand and foot movement on command. On our exam right upper extremity 2/5. No spontaneous movements of the left side.     Imaging: DG CHEST PORT 1 VIEW  Result Date: 09/16/2019 CLINICAL DATA:  Sepsis EXAM: PORTABLE CHEST 1 VIEW COMPARISON:  September 13, 2019 FINDINGS: Tracheostomy  catheter tip is 4.9 cm above the carina. Feeding tube tip is below the diaphragm. Central catheter tip is in the superior vena cava. No pneumothorax. There is mild bibasilar atelectasis. The lungs elsewhere are clear. Heart is upper normal in size with pulmonary vascularity normal. No adenopathy. There is aortic atherosclerosis. No bone lesions. There is calcification in the carotid arteries. IMPRESSION: Tube and catheter positions as described without pneumothorax. Slight bibasilar atelectasis. No edema or airspace opacity. Stable cardiac silhouette. Aortic Atherosclerosis (ICD10-I70.0). There is also bilateral carotid artery calcification. Electronically Signed   By: Lowella Grip III M.D.   On: 09/16/2019 08:14   DG Chest Port 1 View  Result Date: 09/13/2019 CLINICAL DATA:  Status post tracheostomy placement. EXAM: PORTABLE CHEST 1 VIEW COMPARISON:  Single-view of the chest 09/08/2019. FINDINGS: Right PICC and feeding tube remain in place. New tracheostomy tube is seen with the tip in good position at the level the clavicular heads. Support apparatus overlies the left upper chest but there appears to be airspace disease in the left upper lobe. Right lung clear. Heart size normal. Atherosclerosis. IMPRESSION: Tracheostomy tube in good position. Findings worrisome for left upper lobe pneumonia although support apparatus overlies the area of interest. Electronically Signed   By: Inge Rise M.D.   On: 09/13/2019 16:39    Labs:  CBC: Recent Labs    09/11/19 0412 09/13/19 0500 09/15/19 1211 09/17/19 0703  WBC 7.0 6.2 14.9* 17.5*  HGB 4.4* 4.3* 4.9* 5.6*  HCT 16.3* 15.4* 17.5* 19.0*  PLT 304 366 524* 448*    COAGS: Recent Labs    11/27/18 0728 08/18/19 0705 08/26/19 1715 08/27/19 0230  INR 0.9 0.9 1.1 1.2    BMP: Recent Labs    09/11/19 0412 09/13/19 0500 09/15/19 1211 09/17/19 0703  NA 146* 147* 143 142  K 4.0 4.1 3.8 4.0  CL 111 111 106 103  CO2 30 28 27 26   GLUCOSE  214* 118* 262* 95  BUN 21 24* 27* 38*  CALCIUM 8.5* 8.6* 8.5* 8.7*  CREATININE 0.69 0.73 0.78 0.84  GFRNONAA >60 >60 >60 >60  GFRAA >60 >60 >60 >60    LIVER FUNCTION TESTS: Recent Labs    09/11/19 0412 09/13/19 0500 09/15/19 1211 09/17/19 0703  BILITOT 0.7 0.6 0.8 1.1  AST 23 23 20  32  ALT 28 24 26 23   ALKPHOS 51 55 72 82  PROT 6.0* 5.8* 6.7 6.6  ALBUMIN 1.7* 1.7* 1.7* 1.6*    Assessment and Plan:  Right ICA cervical/petrous junction stenosis s/p revascularization using balloon angioplasty via right radial and right femoral approach 08/25/19 by Dr. Estanislado Pandy. Right MCA CVA/left lacunar infarcts. Mild-moderate right thigh (with some retroperitoneal component) hematoma. Acute blood loss anemia; Jehovah's Witness.   S/P tracheostomy 09/13/19; AC held. Current Hgb 5.6. Current WBC 17.5. Possible gastrostomy tube depending on stability  of hemoglobin and assessment of swallowing once off sedation. Further plans per CCM. IR will continue to follow.   Electronically Signed: Soyla Dryer, AGACNP-BC [phone] 09/17/2019, 9:15 AM   I spent a total of 15 Minutes at the the patient's bedside AND on the patient's hospital floor or unit, greater than 50% of which was counseling/coordinating care for right ICA stenosis s/p revascularization.

## 2019-09-17 NOTE — Progress Notes (Signed)
NAME:  Donna STIPP, MRN:  [id number], DOB:  1950/08/15, LOS: 23 ADMISSION DATE:  08/25/2019, CONSULTATION DATE:  08/27/2019 REFERRING MD:  Dr. 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  Admitted by Neuro IR on 6/14 for cerebral angiogram s/p RT ICA cervical/ petrous junction balloon angioplasty for severe stenosis via right femoral approach.  Was placed on ASA/ brillinta.  Monitored in Neuro ICU.  Noted to have developed hypotension, increased left sided weakness and lethargy on 6/15 am with Hgb drop from 13.3 to 6.9 with new AKI.  Post MRI/ MRA showed scattered small acute infarcts along the right MCA/ watershed territory suspected to be resultant either post procedure vs hypoperfusion.    A CT abd/ pelvis was obtained given concern for retroperitoneal bleed which showed a mild to moderate amount of retroperitoneal hematoma in the right iliac and inguinal regiones with moderate size hematoma noted the the soft tissues anterior to the musculature of the right hip; no definite intrapertioneal hemorrhage. INR, PT, and platelets rechecked and were wnl.  Vascular surgery was consulted with plans to observe given bleeding was mostly into the muscle and tissue.  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  7/4 Trached Consults:  NIR - primary Neurology (stroke  has signed off 6/24) Vascular surgery  PCCM Palliative Care  Procedures:    ETT 6/19 > 6/23,  6/23 > Trach 7/4  Significant Diagnostic Tests:   6/14 cerebral angiogram s/p RT ICA cer/petrous junction balloon angioplasty for severe stenosis.   6/15 MRA/ MRI brain  > Scattered small acute infarcts along the right MCA/watershed territory. Solitary small acute infarct in the left parietal cortex and right cerebellum. Improved right ICA patency at the petrous segment. Known severe right M1 segment stenosis. 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  6/15 CT a/p wo contrast > Mild to moderate amount of retroperitoneal hematoma is noted in the right iliac and inguinal regions. Moderate size hematoma is noted in the soft tissues anterior to the musculature of the right hip. No definite intraperitoneal hemorrhage is noted. Moderate size fat containing periumbilical hernia. Aortic Atherosclerosis.   Echo 6/16 > normal LVEF  6/29 MRI brain > Progressed size and confluence of ischemia throughout much of the right hemisphere white matter since 08/26/2019. Associated petechial blood products but no malignant hemorrhagic transformation or mass Effect. There are also multiple new lacunar type infarcts elsewhere, including the brainstem and the left basal ganglia. Underlying chronic infarcts of the right basal ganglia and left cerebellum.  Micro Data:  6/10 SARS 2 >> neg 6/14 MRSA PCR >> neg 6/20 trach asp >> few diptheroids/ corynebacterium species 6/18 BC x 2 >> neg >>  Antimicrobials:  6/14 cefazolin pre-op  6/20 unasyn >> 09/02/2019 7/6 Vanc+ Zosyn>>  Interim history/subjective:  Patient continues to remain febrile with slight improvement in fever curve with T-max of 100.9, still tachycardic and tachypneic, and worsening leukocytosis.  This morning she was placed on trach collar, she became tachypneic and hypoxic to 90, then she was put back on pressure support  Objective    Blood pressure 127/82, pulse (!) 130, temperature (!) 100.9 F (38.3 C), temperature source Axillary, resp. rate (!) 24, height 5\' 1"  (1.549 m), weight 84.7 kg, SpO2 100 %.    Vent Mode: PSV;CPAP FiO2 (%):  [30 %-40 %] 30 % Set Rate:  [18 bmp] 18 bmp Vt Set:  [410 mL] 410 mL PEEP:  [5 cmH20] 5 cmH20 Pressure Support:  [8 cmH20] 8 cmH20   Intake/Output Summary (Last 24 hours) at 09/17/2019 1008 Last data filed at 09/17/2019 0800 Gross per 24 hour  Intake 1951.27 ml  Output 850 ml  Net 1101.27 ml   Filed Weights   09/13/19 1033 09/14/19 0443 09/15/19 0500  Weight: 86.5 kg 84.7 kg 84.7 kg    Examination:  General: Acutely ill appearing deconditioned elderly female lying in bed on mechanical ventilation via trach in NAD  HEENT: 6 cuffed shiley trach midline, MM pink/moist, PERRL, sclera non-icteric  Neuro: Alert with eyes spontaneously open follows commands weakly on the right.  Minimal movement on left upper extremity. Right gaze deviation which comes to the midline but does not cross CV: Tachycardic with regular rhythm, no murmur, rubs, or gallops,  PULM: Wheezes bilaterally, crackles heard in right lower lung zone  GI: soft, bowel sounds active in all 4 quadrants, non-tender, non-distended, tolerating TF Extremities: warm/dry, generalized non-pitting edema  Skin: no rashes or lesions   Resolved Hospital Problem list   Hypotension  AKI  Assessment & Plan:   Right MCA CVA due to bilateral intracranial stenoses  -s/p RT ICA cer/petrous junction balloon angioplasty for severe stenosis c/b right thigh and retroperitoneal hematoma.  Persistent left-sided weakness. P: Supportive care  PT/OT as able  Nutrition and bowel regiment  Seizure precautions  Continue secondary stroke prevention  Avoid anticoagulation or antiplatelets given high risk for bleed Restarted on statin  Sepsis due to right lower lobe pneumonia Patient continues to remain febrile, tachypneic and tachycardic  with worsening leukocytosis P: Chest x-ray shows right lower lobe pneumonia Blood cultures are pending UA is negative Continue IV vancomycin and Zosyn pending cultures specification Patient is at high risk of clinical deterioration, family was updated  Acute respiratory failure with hypoxia requiring mechanical ventilation.  -Failed trial of extubation due to upper airway stridor.  Cords visibly scarred on reintubation. -S/P trach 7/3 P:  Patient failed trach collar trial today, became tachypneic and tachycardic We will continue to try every day  Acute blood loss anemia  - Jehovah's Witness, no blood products including albumin. Hemoglobin has stabilized in the 4 range.  No evidence of active bleeding. P: Continue severe anemia protocol: Iron, vitamin C, vitamin B12, folic acid, and Epogen Limit blood draws  Supportive care-spoke to hematology who expects recovery to be very slow. Hemoglobin is slowly improving  Uncontrolled diabetes with hyperglycemia Patient blood sugars have improved with adjustment of insulin P: Increase Lantus to 32 units once daily Continue NovoLog insulin   Daily Goals Checklist  Pain/Anxiety/Delirium protocol (if indicated): Wean Precedex off VAP protocol (if indicated): Bundle in place Respiratory support goals: Daily SBT Blood pressure target: Keep systolic blood pressure less than 160 DVT prophylaxis: Mechanical DVT prophylaxis only given anemia and high bleeding risk. Nutrition Status: Tolerating tube feeds.  Continue tube feeds.  Attempt to see over the course of this week whether she would be able to  swallow once off sedation.  Otherwise we will have to proceed with PEG tube. GI prophylaxis: Pantoprazole Fluid status goals: Allow autoregulation Urinary catheter: External catheter Central lines: PICC line Glucose control: Adequate glycemic control, type 2 diabetes. Mobility/therapy needs: PT OT Antibiotic de-escalation: No antibiotics Home  medication reconciliation: On hold Daily labs: CBC daily. Code Status: Full code. Family Communication: Son Evaristo Bury updated by Dr. Denese Killings at today. Disposition: ICU.  Labs   CBC: Recent Labs  Lab 09/10/19 1134 09/11/19 0412 09/13/19 0500 09/15/19 1211 09/17/19 0703  WBC 6.9 7.0 6.2 14.9* 17.5*  HGB 4.4* 4.4* 4.3* 4.9* 5.6*  HCT 16.3* 16.3* 15.4* 17.5* 19.0*  MCV 110.1* 110.1* 113.2* 110.1* 105.0*  PLT 274 304 366 524* 448*    Basic Metabolic Panel: Recent Labs  Lab 09/11/19 0412 09/13/19 0500 09/15/19 1211 09/17/19 0703  NA 146* 147* 143 142  K 4.0 4.1 3.8 4.0  CL 111 111 106 103  CO2 30 28 27 26   GLUCOSE 214* 118* 262* 95  BUN 21 24* 27* 38*  CREATININE 0.69 0.73 0.78 0.84  CALCIUM 8.5* 8.6* 8.5* 8.7*   GFR: Estimated Creatinine Clearance: 63.3 mL/min (by C-G formula based on SCr of 0.84 mg/dL). Recent Labs  Lab 09/11/19 0412 09/13/19 0500 09/15/19 1211 09/17/19 0703  WBC 7.0 6.2 14.9* 17.5*    Liver Function Tests: Recent Labs  Lab 09/11/19 0412 09/13/19 0500 09/15/19 1211 09/17/19 0703  AST 23 23 20  32  ALT 28 24 26 23   ALKPHOS 51 55 72 82  BILITOT 0.7 0.6 0.8 1.1  PROT 6.0* 5.8* 6.7 6.6  ALBUMIN 1.7* 1.7* 1.7* 1.6*   No results for input(s): LIPASE, AMYLASE in the last 168 hours. No results for input(s): AMMONIA in the last 168 hours.  ABG    Component Value Date/Time   PHART 7.407 09/03/2019 2106   PCO2ART 60.9 (H) 09/03/2019 2106   PO2ART 87 09/03/2019 2106   HCO3 38.0 (H) 09/03/2019 2106   TCO2 40 (H) 09/03/2019 2106   ACIDBASEDEF 1.0 08/28/2019 0505   O2SAT 96.0 09/03/2019 2106     Coagulation Profile: No results for input(s): INR, PROTIME in the last 168 hours.  Cardiac Enzymes: No results for input(s): CKTOTAL, CKMB, CKMBINDEX, TROPONINI in the last 168 hours.  HbA1C: Hemoglobin A1C  Date/Time Value Ref Range Status  10/12/2017 12:00 AM 9.7  Final   Hgb A1c MFr Bld  Date/Time Value Ref Range Status  05/22/2019 01:12  PM 11.2 (H) 4.8 - 5.6 % Final    Comment:             Prediabetes: 5.7 - 6.4          Diabetes: >6.4          Glycemic control for adults with diabetes: <7.0   11/27/2018 07:27 AM 11.3 (H) 4.8 - 5.6 % Final    Comment:    (NOTE)         Prediabetes: 5.7 - 6.4         Diabetes: >6.4         Glycemic control for adults with diabetes: <7.0     CBG: Recent Labs  Lab 09/16/19 1507 09/16/19 1917 09/16/19 2311 09/17/19 0310 09/17/19 0732  GLUCAP 154* 90 142* 104* 91    Critical care time:    Performed by: 11/17/19  Total critical care time: 34 minutes  Critical care time was exclusive of separately billable procedures and treating other patients.  Critical care was necessary  to treat or prevent imminent or life-threatening deterioration.  Critical care was time spent personally by me on the following activities: development of treatment plan with patient and/or surrogate as well as nursing, discussions with consultants, evaluation of patient's response to treatment, examination of patient, obtaining history from patient or surrogate, ordering and performing treatments and interventions, ordering and review of laboratory studies, ordering and review of radiographic studies, pulse oximetry and re-evaluation of patient's condition.  Cheri Fowler MD Critical care physician [facility] Critical Care  Pager: [phone] Mobile: [phone]   09/17/2019, 10:08 AM

## 2019-09-18 LAB — GLUCOSE, CAPILLARY
Glucose-Capillary: 133 mg/dL — ABNORMAL HIGH (ref 70–99)
Glucose-Capillary: 146 mg/dL — ABNORMAL HIGH (ref 70–99)
Glucose-Capillary: 153 mg/dL — ABNORMAL HIGH (ref 70–99)
Glucose-Capillary: 162 mg/dL — ABNORMAL HIGH (ref 70–99)
Glucose-Capillary: 135 mg/dL — ABNORMAL HIGH (ref 70–99)
Glucose-Capillary: 132 mg/dL — ABNORMAL HIGH (ref 70–99)

## 2019-09-18 LAB — CBC
HCT: 16.4 % — ABNORMAL LOW (ref 36.0–46.0)
Hemoglobin: 4.7 g/dL — CL (ref 12.0–15.0)
MCH: 30.9 pg (ref 26.0–34.0)
MCHC: 28.7 g/dL — ABNORMAL LOW (ref 30.0–36.0)
MCV: 107.9 fL — ABNORMAL HIGH (ref 80.0–100.0)
Platelets: 435 10*3/uL — ABNORMAL HIGH (ref 150–400)
RBC: 1.52 MIL/uL — ABNORMAL LOW (ref 3.87–5.11)
RDW: 20.5 % — ABNORMAL HIGH (ref 11.5–15.5)
WBC: 15.3 10*3/uL — ABNORMAL HIGH (ref 4.0–10.5)
nRBC: 2.2 % — ABNORMAL HIGH (ref 0.0–0.2)

## 2019-09-18 LAB — BASIC METABOLIC PANEL
Anion gap: 12 (ref 5–15)
BUN: 42 mg/dL — ABNORMAL HIGH (ref 8–23)
CO2: 27 mmol/L (ref 22–32)
Calcium: 8.7 mg/dL — ABNORMAL LOW (ref 8.9–10.3)
Chloride: 103 mmol/L (ref 98–111)
Creatinine, Ser: 1.01 mg/dL — ABNORMAL HIGH (ref 0.44–1.00)
GFR calc Af Amer: >60 mL/min (ref >60)
GFR calc non Af Amer: 57 mL/min — ABNORMAL LOW (ref >60)
Glucose, Bld: 151 mg/dL — ABNORMAL HIGH (ref 70–99)
Potassium: 4.4 mmol/L (ref 3.5–5.1)
Sodium: 142 mmol/L (ref 135–145)

## 2019-09-18 LAB — MAGNESIUM: Magnesium: 2.7 mg/dL — ABNORMAL HIGH (ref 1.7–2.4)

## 2019-09-18 LAB — PHOSPHORUS: Phosphorus: 3.9 mg/dL (ref 2.5–4.6)

## 2019-09-18 MED ORDER — CHLORHEXIDINE GLUCONATE 0.12% ORAL RINSE (MEDLINE KIT)
15.0000 mL | Freq: Two times a day (BID) | OROMUCOSAL | Status: DC
  Administered 2019-09-18 – 2019-11-01 (×87): 15 mL via OROMUCOSAL

## 2019-09-18 MED ORDER — ORAL CARE MOUTH RINSE
15.0000 mL | OROMUCOSAL | Status: DC
  Administered 2019-09-18 – 2019-11-01 (×375): 15 mL via OROMUCOSAL

## 2019-09-18 MED ORDER — FREE WATER
200.0000 mL | Status: DC
  Administered 2019-09-18 – 2019-09-22 (×22): 200 mL

## 2019-09-18 NOTE — Progress Notes (Signed)
eLink Physician-Brief Progress Note Patient Name: KYNSIE FALKNER DOB: 1950/11/13 MRN: [id number]   Date of Service  09/18/2019  HPI/Events of Note  Need to exclude occult GI bleed in addition to known recent retro-peritoneal hemorrhage.  eICU Interventions  Stool occult blood check ordered.        Thomasene Lot Clotee Schlicker 09/18/2019, 9:13 PM

## 2019-09-18 NOTE — Progress Notes (Signed)
Referring Physician(s): Melvenia Beam  Supervising Physician: Luanne Bras  Patient Status:  [facility] - In-pt  Chief Complaint: None- intubated with sedation  Subjective: Remains intubated with sedation.  Follows simple commands. Attempting to wean with difficulty. HgB 5.6 7/7 No signs of active bleeding.   No family at bedside this AM.   Allergies: Patient has no known allergies.  Medications: Prior to Admission medications   Medication Sig Start Date End Date Taking? Authorizing Provider  amLODipine (NORVASC) 10 MG tablet Take 1 tablet (10 mg total) by mouth daily. 05/12/19  Yes Minette Brine, FNP  aspirin EC 81 MG tablet Take 81 mg by mouth every evening.   Yes [provider]  Calcium Carbonate-Vitamin D (CALCIUM-D PO) Take 2 tablets by mouth daily at 12 noon.    Yes [provider]  carvedilol (COREG) 6.25 MG tablet TAKE 1 TABLET(6.25 MG) BY MOUTH TWICE DAILY Patient taking differently: Take 6.25 mg by mouth 2 (two) times daily with a meal.  07/31/19  Yes Minette Brine, FNP  hydrALAZINE (APRESOLINE) 50 MG tablet Take 1 tablet (50 mg total) by mouth 2 (two) times daily. 06/16/19 09/14/19 Yes Elouise Munroe, MD  Insulin Degludec-Liraglutide (XULTOPHY) 100-3.6 UNIT-MG/ML SOPN Inject 30 Units into the skin daily. 08/14/19  Yes Minette Brine, FNP  telmisartan-hydrochlorothiazide (MICARDIS HCT) 40-12.5 MG tablet Take 1 tablet by mouth daily. 07/31/19  Yes Minette Brine, FNP  ticagrelor (BRILINTA) 90 MG TABS tablet Take 1 tablet (90 mg total) by mouth 2 (two) times daily. 06/12/19  Yes Elouise Munroe, MD  vitamin C (ASCORBIC ACID) 250 MG tablet Take 500 mg by mouth daily at 12 noon.   Yes [provider]  blood glucose meter kit and supplies KIT Dispense based on patient and insurance preference. Use up to four times daily as directed. (FOR ICD-9 250.00, 250.01). 05/10/18   Minette Brine, FNP  Blood Glucose Monitoring Suppl (TRUE METRIX METER) w/Device  KIT 1 each by Does not apply route in the morning, at noon, in the evening, and at bedtime. 05/22/19   Minette Brine, FNP  Evolocumab (REPATHA SURECLICK) 132 MG/ML SOAJ Inject 140 mg into the skin every 14 (fourteen) days. 06/30/19   Elouise Munroe, MD  glucose blood (TRUE METRIX BLOOD GLUCOSE TEST) test strip Check blood sugar 4 times a day before meals and bedtime 05/22/19   Minette Brine, FNP  Insulin Glargine-Lixisenatide (SOLIQUA) 100-33 UNT-MCG/ML SOPN Inject 25 Units into the skin daily. Patient taking differently: Inject 30 Units into the skin daily.  07/14/19   Minette Brine, FNP  Multiple Vitamin (MULTIVITAMIN PO) Take 1 tablet by mouth daily at 12 noon.     [provider]  Omega-3 Fatty Acids (OMEGA-3 PLUS PO) Take 2 capsules by mouth daily at 12 noon.    [provider]     Vital Signs: BP (!) 155/69   Pulse (!) 108   Temp 99.8 F (37.7 C) (Axillary)   Resp (!) 27   Ht 5' 1" (1.549 m)   Wt 186 lb 11.7 oz (84.7 kg)   SpO2 99%   BMI 35.28 kg/m   Physical Exam Vitals and nursing note reviewed.   Trach/vent. She opens eyes to voice and follows simple commands.  Neuro: Can spontaneously move right side but no spontaneous movements of left side.  Groin: Right groin puncture site soft without active bleeding or hematoma. Right radial puncture site soft without active bleeding or hematoma.  Thigh soft, compressible.  MSK: R wrist with small circumscribed area of swelling.  No oozing, bleeding.  Radial pulse intact.   Imaging: DG CHEST PORT 1 VIEW  Result Date: 09/16/2019 CLINICAL DATA:  Sepsis EXAM: PORTABLE CHEST 1 VIEW COMPARISON:  September 13, 2019 FINDINGS: Tracheostomy catheter tip is 4.9 cm above the carina. Feeding tube tip is below the diaphragm. Central catheter tip is in the superior vena cava. No pneumothorax. There is mild bibasilar atelectasis. The lungs elsewhere are clear. Heart is upper normal in size with pulmonary vascularity normal. No adenopathy.  There is aortic atherosclerosis. No bone lesions. There is calcification in the carotid arteries. IMPRESSION: Tube and catheter positions as described without pneumothorax. Slight bibasilar atelectasis. No edema or airspace opacity. Stable cardiac silhouette. Aortic Atherosclerosis (ICD10-I70.0). There is also bilateral carotid artery calcification. Electronically Signed   By: Lowella Grip III M.D.   On: 09/16/2019 08:14    Labs:  CBC: Recent Labs    09/13/19 0500 09/15/19 1211 09/17/19 0703 09/18/19 0923  WBC 6.2 14.9* 17.5* 15.3*  HGB 4.3* 4.9* 5.6* 4.7*  HCT 15.4* 17.5* 19.0* 16.4*  PLT 366 524* 448* 435*    COAGS: Recent Labs    11/27/18 0728 08/18/19 0705 08/26/19 1715 08/27/19 0230  INR 0.9 0.9 1.1 1.2    BMP: Recent Labs    09/13/19 0500 09/15/19 1211 09/17/19 0703 09/18/19 0923  NA 147* 143 142 142  K 4.1 3.8 4.0 4.4  CL 111 106 103 103  CO2 _0 GLUCOSE 118* 262* 95 151*  BUN 24* 27* 38* 42*  CALCIUM 8.6* 8.5* 8.7* 8.7*  CREATININE 0.73 0.78 0.84 1.01*  GFRNONAA >60 >60 >60 57*  GFRAA >60 >60 >60 >60    LIVER FUNCTION TESTS: Recent Labs    09/11/19 0412 09/13/19 0500 09/15/19 1211 09/17/19 0703  BILITOT 0.7 0.6 0.8 1.1  AST _1 32  ALT _2 ALKPHOS 51 55 72 82  PROT 6.0* 5.8* 6.7 6.6  ALBUMIN 1.7* 1.7* 1.7* 1.6*    Assessment and Plan:  Right ICA cervical/petrous junction stenosis s/p revascularization using balloon angioplasty via right radial and right femoral approach6/14/2021by Dr. Estanislado Pandy. Mild-moderate right thigh (with some retroperitoneal component) hematoma. Patient's condition stable- remains on trach/vent. Difficulty weaning. She does follow simple commands, but is resistant today.  Right groinand right radial puncture sitesstable, no signs of active bleeding. Right wrist with small area of swelling.  No oozing or bleeding. Non-tender. Monitor. Pulse intact.   Hgb stable 5.6 She is receiving  supplementation per severe anemia protocol.   Continue to hold Brilinta/Aspirin/Lovenox.Agree with CCM plans to avoid excess blood draws. Further plans per CCM/neurology/palliative care- appreciate and agree with management.  Currently full code with plans for PEG once hemoglobin improves and swallow assessed.  NIR to follow.  No family at bedside this AM  Electronically Signed: Docia Barrier, PA 09/18/2019, 1:20 PM   I spent a total of 15 Minutes at the the patient's bedside AND on the patient's hospital floor or unit, greater than 50% of which was counseling/coordinating care for right ICA stenosis s/p revascularization.

## 2019-09-18 NOTE — TOC Progression Note (Signed)
Transition of Care [facility]) - Progression Note    Patient Details  Name: Donna [name] MRN: [id number] Date of Birth: 1950/08/13  Transition of Care [facility]) CM/SW Contact  Glennon Mac, RN Phone Number: 09/18/2019, 4:00 PM  Clinical Narrative:   Pt meets eligibility requirements for [facility] hospital referral as has been in ICU and on ventilator for >21 days.  Pt meets medical requirements for LTAC.  Please consider TOC consult for LTAC referral.  MD may need to have conversation with family about transition to [facility] hospital prior to being called by case manager, due to difficult nature of case.     Expected Discharge Plan: Long Term Acute Care (LTAC) Barriers to Discharge: Continued Medical Work up  Expected Discharge Plan and Services Expected Discharge Plan: Long Term Acute Care (LTAC)                                               Social Determinants of Health (SDOH) Interventions    Readmission Risk Interventions No flowsheet data found.  Quintella Baton, RN, BSN  Trauma/Neuro ICU Case Manager [phone]

## 2019-09-18 NOTE — Progress Notes (Addendum)
Nutrition Follow-up  DOCUMENTATION CODES:   Not applicable  INTERVENTION:   Tube feeding via Cortrak: - Vital AF 1.2 @ 50 ml/hr (1200 ml/day) - Pro-stat 30 ml daily - Free water 100 ml q 4 hours  Tube feeding regimen provides 1540 kcal, 105 grams of protein, and 973 ml of H2O. Total free water: 1573 ml   NUTRITION DIAGNOSIS:   Inadequate oral intake related to inability to eat as evidenced by NPO status.  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  Continue to try and wean now s/p trach. Cortrak remains in place, may need long term feeding access.   6/14 - s/p balloon angioplasty for severe stenosis, pt developed lethargy and increased L sided weakness found to have new watershed infarcts, started on vasopressors for cerebral perfusion 6/16 - cortrak placed, tip in stomach 6/17 - pt with increased agitation requiring increased O2 6/18 - pt pulled out Cortrak, Cortrak replaced 6/19 - Cortrak removed 6/20 - intubated 6/23 - Cortrak replaced (tip gastric per Cortrak team), extubated, later reintubated 7/3 s/p trach   Patient is currently on ventilator support via trach MV: 10.2 L/min Temp (24hrs), Avg:99.9 F (37.7 C), Min:99 F (37.2 C), Max:101.4 F (38.6 C)  Medications reviewed and include: colace, pepcid, SSI q 4 hours, Novolog 4 units q 4 hours, Lantus 32 units daily, vitamin B12, ferric gluconate  Labs reviewed: hemoglobin: 5.6  CBG's: 133-169 x 24 hours  Diet Order:   Diet Order            Diet NPO time specified  Diet effective midnight                 EDUCATION NEEDS:   Not appropriate for  education at this time  Skin:  Skin Assessment: Skin Integrity Issues: Incisions: right groin  Last BM:  7/7  Height:   Ht Readings from Last 1 Encounters:  08/31/19 5' 1"  (1.549 m)    Weight:   Wt Readings from Last 1 Encounters:  09/15/19 84.7 kg    Ideal Body Weight:  47.7 kg  BMI:  Body mass index is 35.28 kg/m.  Estimated Nutritional Needs:   Kcal:  1485  Protein:  90-105 grams  Fluid:  >1.5 L/day  Lockie Pares., RD, LDN, CNSC See AMiON for contact information

## 2019-09-18 NOTE — Progress Notes (Signed)
NAME:  Donna [name], MRN:  [id number], DOB:  08-17-1950, LOS: 24 ADMISSION DATE:  08/25/2019, CONSULTATION DATE:  08/27/2019 REFERRING MD:  Dr. 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  Admitted by Neuro IR on 6/14 for cerebral angiogram s/p RT ICA cervical/ petrous junction balloon angioplasty for severe stenosis via right femoral approach.  Was placed on ASA/ brillinta.  Monitored in Neuro ICU.  Noted to have developed hypotension, increased left sided weakness and lethargy on 6/15 am with Hgb drop from 13.3 to 6.9 with new AKI.  Post MRI/ MRA showed scattered small acute infarcts along the right MCA/ watershed territory suspected to be resultant either post procedure vs hypoperfusion.    A CT abd/ pelvis was obtained given concern for retroperitoneal bleed which showed a mild to moderate amount of retroperitoneal hematoma in the right iliac and inguinal regiones with moderate size hematoma noted the the soft tissues anterior to the musculature of the right hip; no definite intrapertioneal hemorrhage. INR, PT, and platelets rechecked and were wnl.  Vascular surgery was consulted with plans to observe given bleeding was mostly into the muscle and tissue.  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  7/4 Trached Consults:  NIR -  primary Neurology (stroke has signed off 6/24) Vascular surgery  PCCM Palliative Care  Procedures:    ETT 6/19 > 6/23,  6/23 > Trach 7/4  Significant Diagnostic Tests:   6/14 cerebral angiogram s/p RT ICA cer/petrous junction balloon angioplasty for severe stenosis.   6/15 MRA/ MRI brain  > Scattered small acute infarcts along the right MCA/watershed territory. Solitary small acute infarct in the left parietal cortex and right cerebellum. Improved right ICA patency at the petrous segment. Known severe right M1 segment stenosis. 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  6/15 CT a/p wo contrast > Mild to moderate amount of retroperitoneal hematoma is noted in the right iliac and inguinal regions. Moderate size hematoma is noted in the soft tissues anterior to the musculature of the right hip. No definite intraperitoneal hemorrhage is noted. Moderate size fat containing periumbilical hernia. Aortic Atherosclerosis.   Echo 6/16 > normal LVEF  6/29 MRI brain > Progressed size and confluence of ischemia throughout much of the right hemisphere white matter since 08/26/2019. Associated petechial blood products but no malignant hemorrhagic transformation or mass Effect. There are also multiple new lacunar type infarcts elsewhere, including the brainstem and the left basal ganglia. Underlying chronic infarcts of the right basal ganglia and left cerebellum.  Micro Data:  6/10 SARS 2 >> neg 6/14 MRSA PCR >> neg 6/20 trach asp >> few diptheroids/ corynebacterium species 6/18 BC x 2 >> neg >>  Antimicrobials:  6/14 cefazolin pre-op  6/20 unasyn >> 09/02/2019 7/6 Vanc >>7/8 7/6 Zosyn>>  Interim history/subjective:  Patient's fever curve is slightly better, still tachypneic and tachycardic.  Hypoxia has improved.  T-max 101  Objective  Blood pressure (!) 143/51, pulse 95, temperature 99.8 F (37.7 C), temperature source Axillary, resp. rate 20, height 5\' 1"  (1.549 m), weight  84.7 kg, SpO2 100 %.    Vent Mode: PSV;CPAP FiO2 (%):  [30 %] 30 % Set Rate:  [18 bmp] 18 bmp Vt Set:  [400 mL-410 mL] 410 mL PEEP:  [5 cmH20] 5 cmH20 Pressure Support:  [8 cmH20-10 cmH20] 10 cmH20 Plateau Pressure:  [9 cmH20-29 cmH20] 29 cmH20   Intake/Output Summary (Last 24 hours) at 09/18/2019 0939 Last data filed at 09/18/2019 0900 Gross per 24 hour  Intake 2779.1 ml  Output 1150 ml  Net 1629.1 ml   Filed Weights   09/13/19 1033 09/14/19 0443 09/15/19 0500  Weight: 86.5 kg 84.7 kg 84.7 kg    Examination:  General: Acutely ill appearing deconditioned elderly female lying in bed on mechanical ventilation via trach in NAD  HEENT: 6 cuffed shiley trach midline, MM pink/moist, PERRL, sclera non-icteric  Neuro: Alert with eyes spontaneously open follows commands weakly on the right.  Minimal movement on left upper extremity. Right gaze deviation which comes to the midline but does not cross CV: Tachycardic with regular rhythm, no murmur, rubs, or gallops,  PULM: Crackles heard in right lower lung zone  GI: soft, bowel sounds active in all 4 quadrants, non-tender, non-distended, tolerating TF Extremities: warm/dry, generalized non-pitting edema  Skin: no rashes or lesions   Resolved Hospital Problem list   Hypotension  AKI  Assessment & Plan:   Right MCA CVA due to bilateral intracranial stenoses  -s/p RT ICA cer/petrous junction balloon angioplasty for severe stenosis c/b right thigh and retroperitoneal hematoma.  Persistent left-sided weakness. P: Supportive care  PT/OT as able  Nutrition and bowel regiment  Seizure precautions  Continue secondary stroke prevention  Avoid anticoagulation or antiplatelets given high risk for bleed for now will resume once hemoglobin improves continue statin  Sepsis due to right lower lobe pneumonia due to Morganella Patient continues to remain febrile, but with slight improvement in fever curve, still tachypneic and tachycardic with  worsening leukocytosis P: Blood cultures is negative for 24 hours respiratory culture is growing Morganella discontinue vancomycin continue Zosyn to complete 7 days therapy Patient is at high risk of clinical deterioration, family was updated  Acute respiratory failure with hypoxia requiring mechanical ventilation.  -Failed trial of extubation due to upper airway stridor.  Cords visibly scarred on reintubation. -S/P trach 7/3 P:  Patient failed trach collar trial yesterday, will try again today  Acute blood loss anemia  - Jehovah's Witness, no blood products including albumin. Hemoglobin has stabilized in the 4 range.  No evidence of active bleeding. P: Continue severe anemia protocol: Iron, vitamin C, vitamin B12, folic acid, and Epogen Limit blood draws  Supportive care-spoke to hematology who expects recovery to be very slow. Hemoglobin is slowly improving  Uncontrolled diabetes with hyperglycemia Patient blood sugars have improved with adjustment of insulin P: Continue Lantus Continue NovoLog insulin   Daily Goals Checklist  Pain/Anxiety/Delirium protocol (if indicated): Wean Precedex off VAP protocol (if indicated): Bundle in place Respiratory support goals: Daily SBT Blood pressure target: Keep systolic blood pressure less than 160 DVT prophylaxis: Mechanical DVT prophylaxis only given anemia and high bleeding risk. Nutrition Status: Tolerating tube feeds.  Continue tube feeds.  Attempt to see over the course of this week whether she would be able to swallow once off sedation.  Otherwise we will have to proceed with PEG tube. GI prophylaxis: Pantoprazole Fluid  status goals: Allow autoregulation Urinary catheter: External catheter Central lines: PICC line Glucose control: Adequate glycemic control, type 2 diabetes. Mobility/therapy needs: PT OT Antibiotic de-escalation: No antibiotics Home medication reconciliation: On hold Daily labs: Every 48 hours Code Status: Full  code. Family Communication: Son Evaristo BuryKinya updated Disposition: ICU.  Labs   CBC: Recent Labs  Lab 09/13/19 0500 09/15/19 1211 09/17/19 0703  WBC 6.2 14.9* 17.5*  HGB 4.3* 4.9* 5.6*  HCT 15.4* 17.5* 19.0*  MCV 113.2* 110.1* 105.0*  PLT 366 524* 448*    Basic Metabolic Panel: Recent Labs  Lab 09/13/19 0500 09/15/19 1211 09/17/19 0703  NA 147* 143 142  K 4.1 3.8 4.0  CL 111 106 103  CO2 28 27 26   GLUCOSE 118* 262* 95  BUN 24* 27* 38*  CREATININE 0.73 0.78 0.84  CALCIUM 8.6* 8.5* 8.7*   GFR: Estimated Creatinine Clearance: 63.3 mL/min (by C-G formula based on SCr of 0.84 mg/dL). Recent Labs  Lab 09/13/19 0500 09/15/19 1211 09/17/19 0703  WBC 6.2 14.9* 17.5*    Liver Function Tests: Recent Labs  Lab 09/13/19 0500 09/15/19 1211 09/17/19 0703  AST 23 20 32  ALT 24 26 23   ALKPHOS 55 72 82  BILITOT 0.6 0.8 1.1  PROT 5.8* 6.7 6.6  ALBUMIN 1.7* 1.7* 1.6*   No results for input(s): LIPASE, AMYLASE in the last 168 hours. No results for input(s): AMMONIA in the last 168 hours.  ABG    Component Value Date/Time   PHART 7.407 09/03/2019 2106   PCO2ART 60.9 (H) 09/03/2019 2106   PO2ART 87 09/03/2019 2106   HCO3 38.0 (H) 09/03/2019 2106   TCO2 40 (H) 09/03/2019 2106   ACIDBASEDEF 1.0 08/28/2019 0505   O2SAT 96.0 09/03/2019 2106     Coagulation Profile: No results for input(s): INR, PROTIME in the last 168 hours.  Cardiac Enzymes: No results for input(s): CKTOTAL, CKMB, CKMBINDEX, TROPONINI in the last 168 hours.  HbA1C: Hemoglobin A1C  Date/Time Value Ref Range Status  10/12/2017 12:00 AM 9.7  Final   Hgb A1c MFr Bld  Date/Time Value Ref Range Status  05/22/2019 01:12 PM 11.2 (H) 4.8 - 5.6 % Final    Comment:             Prediabetes: 5.7 - 6.4          Diabetes: >6.4          Glycemic control for adults with diabetes: <7.0   11/27/2018 07:27 AM 11.3 (H) 4.8 - 5.6 % Final    Comment:    (NOTE)         Prediabetes: 5.7 - 6.4         Diabetes:  >6.4         Glycemic control for adults with diabetes: <7.0     CBG: Recent Labs  Lab 09/17/19 1545 09/17/19 1918 09/17/19 2303 09/18/19 0318 09/18/19 0730  GLUCAP 169* 133* 156* 133* 146*    Critical care time:    Performed by: Cheri FowlerSudham Janise Gora  Total critical care time: 32 minutes  Critical care time was exclusive of separately billable procedures and treating other patients.  Critical care was necessary to treat or prevent imminent or life-threatening deterioration.  Critical care was time spent personally by me on the following activities: development of treatment plan with patient and/or surrogate as well as nursing, discussions with consultants, evaluation of patient's response to treatment, examination of patient, obtaining history from patient or surrogate, ordering and performing treatments and interventions,  ordering and review of laboratory studies, ordering and review of radiographic studies, pulse oximetry and re-evaluation of patient's condition.  Cheri Fowler MD Critical care physician [facility]  Pager: [phone] Mobile: [phone]   09/18/2019, 9:39 AM

## 2019-09-19 DIAGNOSIS — R6520 Severe sepsis without septic shock: Secondary | ICD-10-CM

## 2019-09-19 DIAGNOSIS — A408 Other streptococcal sepsis: Secondary | ICD-10-CM

## 2019-09-19 LAB — GLUCOSE, CAPILLARY
Glucose-Capillary: 139 mg/dL — ABNORMAL HIGH (ref 70–99)
Glucose-Capillary: 103 mg/dL — ABNORMAL HIGH (ref 70–99)
Glucose-Capillary: 161 mg/dL — ABNORMAL HIGH (ref 70–99)
Glucose-Capillary: 195 mg/dL — ABNORMAL HIGH (ref 70–99)
Glucose-Capillary: 184 mg/dL — ABNORMAL HIGH (ref 70–99)
Glucose-Capillary: 210 mg/dL — ABNORMAL HIGH (ref 70–99)

## 2019-09-19 LAB — OCCULT BLOOD X 1 CARD TO LAB, STOOL: Fecal Occult Bld: NEGATIVE

## 2019-09-19 MED ORDER — PREDNISONE 20 MG PO TABS
40.0000 mg | ORAL_TABLET | Freq: Every day | ORAL | Status: AC
  Administered 2019-09-19 – 2019-09-23 (×5): 40 mg
  Filled 2019-09-19 (×5): qty 2

## 2019-09-19 MED ORDER — PREDNISONE 20 MG PO TABS: 40.0000 mg | ORAL_TABLET | Freq: Every day | ORAL | Status: DC

## 2019-09-19 NOTE — Progress Notes (Signed)
Referring Physician(s): Melvenia Beam  Supervising Physician: Luanne Bras  Patient Status:  [facility] - In-pt  Chief Complaint: None- tracheostomy, sedation  Subjective:  Right ICA cervical/petrous junction stenosis s/p revascularization using balloon angioplasty via right radial and right femoral approach6/14/2021by Dr. Estanislado Pandy. Mild-moderate right thigh (with some retroperitoneal component) hematoma. Acute blood loss anemia, Jehovah's Witness. Patient laying in bed, tracheostomy in place, sedated, undergoing pulmonary toilet.Sheopens eyes to voiceand minimally follows simple commands. Follows commands with right side (wiggles right toes, minimal right hand grip), demonstrates left-sided neglect. Right groin and right radial puncture sites c/d/i. Most recenthgbwas4.7yesterday. No family at bedside this AM.   Allergies: Patient has no known allergies.  Medications: Prior to Admission medications   Medication Sig Start Date End Date Taking? Authorizing Provider  amLODipine (NORVASC) 10 MG tablet Take 1 tablet (10 mg total) by mouth daily. 05/12/19  Yes Minette Brine, FNP  aspirin EC 81 MG tablet Take 81 mg by mouth every evening.   Yes [provider]  Calcium Carbonate-Vitamin D (CALCIUM-D PO) Take 2 tablets by mouth daily at 12 noon.    Yes [provider]  carvedilol (COREG) 6.25 MG tablet TAKE 1 TABLET(6.25 MG) BY MOUTH TWICE DAILY Patient taking differently: Take 6.25 mg by mouth 2 (two) times daily with a meal.  07/31/19  Yes Minette Brine, FNP  hydrALAZINE (APRESOLINE) 50 MG tablet Take 1 tablet (50 mg total) by mouth 2 (two) times daily. 06/16/19 09/14/19 Yes Elouise Munroe, MD  Insulin Degludec-Liraglutide (XULTOPHY) 100-3.6 UNIT-MG/ML SOPN Inject 30 Units into the skin daily. 08/14/19  Yes Minette Brine, FNP  telmisartan-hydrochlorothiazide (MICARDIS HCT) 40-12.5 MG tablet Take 1 tablet by mouth daily. 07/31/19  Yes Minette Brine, FNP   ticagrelor (BRILINTA) 90 MG TABS tablet Take 1 tablet (90 mg total) by mouth 2 (two) times daily. 06/12/19  Yes Elouise Munroe, MD  vitamin C (ASCORBIC ACID) 250 MG tablet Take 500 mg by mouth daily at 12 noon.   Yes [provider]  blood glucose meter kit and supplies KIT Dispense based on patient and insurance preference. Use up to four times daily as directed. (FOR ICD-9 250.00, 250.01). 05/10/18   Minette Brine, FNP  Blood Glucose Monitoring Suppl (TRUE METRIX METER) w/Device KIT 1 each by Does not apply route in the morning, at noon, in the evening, and at bedtime. 05/22/19   Minette Brine, FNP  Evolocumab (REPATHA SURECLICK) 384 MG/ML SOAJ Inject 140 mg into the skin every 14 (fourteen) days. 06/30/19   Elouise Munroe, MD  glucose blood (TRUE METRIX BLOOD GLUCOSE TEST) test strip Check blood sugar 4 times a day before meals and bedtime 05/22/19   Minette Brine, FNP  Insulin Glargine-Lixisenatide (SOLIQUA) 100-33 UNT-MCG/ML SOPN Inject 25 Units into the skin daily. Patient taking differently: Inject 30 Units into the skin daily.  07/14/19   Minette Brine, FNP  Multiple Vitamin (MULTIVITAMIN PO) Take 1 tablet by mouth daily at 12 noon.     [provider]  Omega-3 Fatty Acids (OMEGA-3 PLUS PO) Take 2 capsules by mouth daily at 12 noon.    [provider]     Vital Signs: BP (!) 123/57   Pulse 78   Temp 98.4 F (36.9 C) (Oral)   Resp (!) 23   Ht _0  (1.549 m)   Wt 201 lb 11.5 oz (91.5 kg)   SpO2 100%   BMI 38.11 kg/m   Physical Exam Vitals and nursing note reviewed.  Constitutional:      General: She is not in acute distress.    Comments: Tracheostomy. Sedated.  Pulmonary:     Effort: Pulmonary effort is normal. No respiratory distress.     Comments: Tracheostomy. Sedated. Skin:    General: Skin is warm and dry.     Comments: Bilateral upper extremities with anasarca (from hands to mid forearms). Right groin puncture site soft without active  bleeding or hematoma. Right radial puncture site soft without active bleeding or hematoma.   Neurological:     Comments: Tracheostomy. Sedated. She opens eyes to voice and minimally follows simple commands. More alert today. PERRL bilaterally. Follows commands with right side (wiggles right toes, minimal right hand grip), demonstrates left-sided neglect. Distal pulses (DPs) 1+ bilaterally and (radial) 2+ bilaterally.       Imaging: DG CHEST PORT 1 VIEW  Result Date: 09/16/2019 CLINICAL DATA:  Sepsis EXAM: PORTABLE CHEST 1 VIEW COMPARISON:  September 13, 2019 FINDINGS: Tracheostomy catheter tip is 4.9 cm above the carina. Feeding tube tip is below the diaphragm. Central catheter tip is in the superior vena cava. No pneumothorax. There is mild bibasilar atelectasis. The lungs elsewhere are clear. Heart is upper normal in size with pulmonary vascularity normal. No adenopathy. There is aortic atherosclerosis. No bone lesions. There is calcification in the carotid arteries. IMPRESSION: Tube and catheter positions as described without pneumothorax. Slight bibasilar atelectasis. No edema or airspace opacity. Stable cardiac silhouette. Aortic Atherosclerosis (ICD10-I70.0). There is also bilateral carotid artery calcification. Electronically Signed   By: Lowella Grip III M.D.   On: 09/16/2019 08:14    Labs:  CBC: Recent Labs    09/13/19 0500 09/15/19 1211 09/17/19 0703 09/18/19 0923  WBC 6.2 14.9* 17.5* 15.3*  HGB 4.3* 4.9* 5.6* 4.7*  HCT 15.4* 17.5* 19.0* 16.4*  PLT 366 524* 448* 435*    COAGS: Recent Labs    11/27/18 0728 08/18/19 0705 08/26/19 1715 08/27/19 0230  INR 0.9 0.9 1.1 1.2    BMP: Recent Labs    09/13/19 0500 09/15/19 1211 09/17/19 0703 09/18/19 0923  NA 147* 143 142 142  K 4.1 3.8 4.0 4.4  CL 111 106 103 103  CO2 _0 GLUCOSE 118* 262* 95 151*  BUN 24* 27* 38* 42*  CALCIUM 8.6* 8.5* 8.7* 8.7*  CREATININE 0.73 0.78 0.84 1.01*  GFRNONAA >60 >60 >60  57*  GFRAA >60 >60 >60 >60    LIVER FUNCTION TESTS: Recent Labs    09/11/19 0412 09/13/19 0500 09/15/19 1211 09/17/19 0703  BILITOT 0.7 0.6 0.8 1.1  AST _1 32  ALT _2 ALKPHOS 51 55 72 82  PROT 6.0* 5.8* 6.7 6.6  ALBUMIN 1.7* 1.7* 1.7* 1.6*    Assessment and Plan:  Right ICA cervical/petrous junction stenosis s/p revascularization using balloon angioplasty via right radial and right femoral approach6/14/2021by Dr. Estanislado Pandy. Mild-moderate right thigh (with some retroperitoneal component) hematoma. Acute blood loss anemia, Jehovah's Witness. Patient's condition stable- tracheostomy with sedation,opens eyes to voice and minimally follows simple commands, can wiggle right toes and minimal right hand grip to command, demonstrates left-sided neglect. Right groinand right radial puncture sitesstable, no signs of active bleeding. Hgb 4.7 yesterday, occult blood negative this AM- receiving supplementation per severe anemia protocol. Awaiting timing for possible gastrostomy tube placement- recommend WBCs WNL and afebrile before moving forward with this. Continue to hold Brilinta/Aspirin/Lovenox.Agree with CCM plans toavoid excess blood drawl. Further plans per CCM/neurology/palliative care- appreciate  and agree with management. NIR to follow.   Electronically Signed: Earley Abide, PA-C 09/19/2019, 7:54 AM   I spent a total of 25 Minutes at the the patient's bedside AND on the patient's hospital floor or unit, greater than 50% of which was counseling/coordinating care for right ICA stenosis s/p revascularization.

## 2019-09-19 NOTE — Progress Notes (Signed)
Physical Therapy Treatment Patient Details Name: Donna [name] MRN: [id number] DOB: 1950/07/17 Today's Date: 09/19/2019    History of Present Illness 69 y.o. female with PMH significant for HTN, HLD, CVA 06/2018, DM, anemia, obesity, OSA, and OA. She is known to [facility] and has been followed by Dr. Corliss Skains since 10/2018. She first presented to our department at the request of Dr. Lucia Gaskins for management of intracranial stenosis, thought to be cause of recent CVA at that time. Pt underwent R ICA angioplasty on 6/14. Pt with decline in neuro status and hemoglobin on 6/15, found to have a large R groin hematoma.  Pt has had Hbg 5.0 or less and is a Jehovah's witness.  She was intubated on 6/19. Pt underwent tracheostomy on 7/3 and remains ventilated. Pt found to be septic with RLL PNA on 09/16/2019    PT Comments    Pt tolerates treatment well, sitting for multiple minutes with PT assistance this session. Pt demonstrates some improvement in command following with RUE compared to last session, however is still very inconsistent. Pt is generally weak with R side and remains flaccid with L. Pt will benefit from continued acute PT services to improve activity tolerance and functional mobility quality. PT continues to recommend SNF placement.   Follow Up Recommendations  SNF     Equipment Recommendations  Wheelchair (measurements PT);Wheelchair cushion (measurements PT);Hospital bed    Recommendations for Other Services       Precautions / Restrictions Precautions Precautions: Fall Precaution Comments: trach to vent, low Hgb Restrictions Weight Bearing Restrictions: No    Mobility  Bed Mobility Overal bed mobility: Needs Assistance Bed Mobility: Supine to Sit;Sit to Supine     Supine to sit: Total assist Sit to supine: Total assist      Transfers                    Ambulation/Gait                 Stairs             Wheelchair Mobility    Modified Rankin (Stroke  Patients Only) Modified Rankin (Stroke Patients Only) Pre-Morbid Rankin Score: No symptoms Modified Rankin: Severe disability     Balance Overall balance assessment: Needs assistance Sitting-balance support: Bilateral upper extremity supported;Feet unsupported Sitting balance-Leahy Scale: Zero Sitting balance - Comments: totalA Postural control: Posterior lean                                  Cognition Arousal/Alertness: Awake/alert Behavior During Therapy: Flat affect Overall Cognitive Status: Difficult to assess                                 General Comments: pt follows some commands with R side, no AROM noted of L side. Pt does not produce any meaningful gestures other than shaking head no once during session      Exercises General Exercises - Upper Extremity Shoulder Flexion: PROM;Both;10 reps Elbow Flexion: PROM;Both;10 reps Elbow Extension: PROM;Both;10 reps Wrist Flexion: PROM;Both;10 reps Wrist Extension: PROM;Both;10 reps Digit Composite Flexion: PROM;Both;10 reps Composite Extension: PROM;Both;10 reps Other Exercises Other Exercises: PROM bilateral ankle, knee, hip flexion and extension for 10 reps PROM    General Comments General comments (skin integrity, edema, etc.): pt on trach to vent, 30% FiO2, 5 PEEP, 18 RR. VSS during  session      Pertinent Vitals/Pain Pain Assessment: Faces Faces Pain Scale: Hurts little more Pain Location: generalized Pain Descriptors / Indicators: Grimacing Pain Intervention(s): Monitored during session    Home Living                      Prior Function            PT Goals (current goals can now be found in the care plan section) Acute Rehab PT Goals Patient Stated Goal: pt unable to state, PT goal to improve bed mobility and transfer quality Progress towards PT goals: Progressing toward goals (very slowly)    Frequency    Min 2X/week      PT Plan Current plan remains  appropriate    Co-evaluation              AM-PAC PT "6 Clicks" Mobility   Outcome Measure  Help needed turning from your back to your side while in a flat bed without using bedrails?: Total Help needed moving from lying on your back to sitting on the side of a flat bed without using bedrails?: Total Help needed moving to and from a bed to a chair (including a wheelchair)?: Total Help needed standing up from a chair using your arms (e.g., wheelchair or bedside chair)?: Total Help needed to walk in hospital room?: Total Help needed climbing 3-5 steps with a railing? : Total 6 Click Score: 6    End of Session Equipment Utilized During Treatment: Oxygen Activity Tolerance: Patient tolerated treatment well Patient left: in bed;with call bell/phone within reach;with bed alarm set Nurse Communication: Mobility status;Need for lift equipment PT Visit Diagnosis: Other abnormalities of gait and mobility (R26.89);Muscle weakness (generalized) (M62.81);Other symptoms and signs involving the nervous system (R29.898);Hemiplegia and hemiparesis Hemiplegia - Right/Left: Left Hemiplegia - caused by: Cerebral infarction     Time: 3220-2542 PT Time Calculation (min) (ACUTE ONLY): 35 min  Charges:  $Therapeutic Exercise: 8-22 mins $Therapeutic Activity: 8-22 mins                     Arlyss Gandy, PT, DPT Acute Rehabilitation Pager: [phone]    Arlyss Gandy 09/19/2019, 4:21 PM

## 2019-09-19 NOTE — Progress Notes (Signed)
NAME:  Donna [name], MRN:  [id number], DOB:  Feb 29, 1951, LOS: 25 ADMISSION DATE:  08/25/2019, CONSULTATION DATE:  08/27/2019 REFERRING MD:  Dr. 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  Admitted by Neuro IR on 6/14 for cerebral angiogram s/p RT ICA cervical/ petrous junction balloon angioplasty for severe stenosis via right femoral approach.  Was placed on ASA/ brillinta.  Monitored in Neuro ICU.  Noted to have developed hypotension, increased left sided weakness and lethargy on 6/15 am with Hgb drop from 13.3 to 6.9 with new AKI.  Post MRI/ MRA showed scattered small acute infarcts along the right MCA/ watershed territory suspected to be resultant either post procedure vs hypoperfusion.    A CT abd/ pelvis was obtained given concern for retroperitoneal bleed which showed a mild to moderate amount of retroperitoneal hematoma in the right iliac and inguinal regiones with moderate size hematoma noted the the soft tissues anterior to the musculature of the right hip; no definite intrapertioneal hemorrhage. INR, PT, and platelets rechecked and were wnl.  Vascular surgery was consulted with plans to observe given bleeding was mostly into the muscle and tissue.  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  7/4 Trached Consults:  NIR - primary Neurology (stroke  has signed off 6/24) Vascular surgery  PCCM Palliative Care  Procedures:    ETT 6/19 > 6/23,  6/23 > Trach 7/4  Significant Diagnostic Tests:   6/14 cerebral angiogram s/p RT ICA cer/petrous junction balloon angioplasty for severe stenosis.   6/15 MRA/ MRI brain  > Scattered small acute infarcts along the right MCA/watershed territory. Solitary small acute infarct in the left parietal cortex and right cerebellum. Improved right ICA patency at the petrous segment. Known severe right M1 segment stenosis. 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  6/15 CT a/p wo contrast > Mild to moderate amount of retroperitoneal hematoma is noted in the right iliac and inguinal regions. Moderate size hematoma is noted in the soft tissues anterior to the musculature of the right hip. No definite intraperitoneal hemorrhage is noted. Moderate size fat containing periumbilical hernia. Aortic Atherosclerosis.   Echo 6/16 > normal LVEF  6/29 MRI brain > Progressed size and confluence of ischemia throughout much of the right hemisphere white matter since 08/26/2019. Associated petechial blood products but no malignant hemorrhagic transformation or mass Effect. There are also multiple new lacunar type infarcts elsewhere, including the brainstem and the left basal ganglia. Underlying chronic infarcts of the right basal ganglia and left cerebellum.  Micro Data:  6/10 SARS 2 >> neg 6/14 MRSA PCR >> neg 6/20 trach asp >> few diptheroids/ corynebacterium species 6/18 BC x 2 >> neg 7/6 Morganella  Antimicrobials:  6/14 cefazolin pre-op  6/20 unasyn >> 09/02/2019 7/6 Vanc+ Zosyn>>  Interim history/subjective:  Patient is afebrile now, also tachycardia and tachypnea has improved. Still continue to fail spontaneous breathing trial, gets tachypneic within minutes of pressure  support.  Objective   Blood pressure (!) 123/57, pulse 81, temperature 98.7 F (37.1 C), temperature source Axillary, resp.  rate 19, height 5\' 1"  (1.549 m), weight 91.5 kg, SpO2 100 %.    Vent Mode: PRVC FiO2 (%):  [30 %] 30 % Set Rate:  [18 bmp] 18 bmp Vt Set:  [410 mL] 410 mL PEEP:  [5 cmH20] 5 cmH20 Pressure Support:  [10 cmH20] 10 cmH20 Plateau Pressure:  [12 cmH20-21 cmH20] 12 cmH20   Intake/Output Summary (Last 24 hours) at 09/19/2019 0936 Last data filed at 09/19/2019 0700 Gross per 24 hour  Intake 2254.59 ml  Output 1075 ml  Net 1179.59 ml   Filed Weights   09/14/19 0443 09/15/19 0500 09/19/19 0500  Weight: 84.7 kg 84.7 kg 91.5 kg    Examination:  General: Acutely ill appearing deconditioned elderly female lying in bed on mechanical ventilation via trach in NAD  HEENT: 6 cuffed shiley trach midline, MM pink/moist, PERRL, sclera non-icteric  Neuro: Alert, opens eyes spontaneously and follows commands weakly on the right.  Minimal movement on left upper extremity. 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  Persistent left-sided weakness. P: Supportive care  PT/OT as able  Nutrition and bowel regiment  Seizure precautions  Continue secondary stroke prevention  Avoid anticoagulation or antiplatelets given high risk for bleed Continue atorvastatin  Sepsis (was not present on admission )due to right lower lobe pneumonia Slowly improving P: Patient's respiratory culture grew Morganella Antibiotics were switched to Zosyn to complete for 7 days,  vancomycin was discontinued  Acute respiratory failure with hypoxia requiring mechanical ventilation.  -Failed trial of extubation due to upper airway stridor.  Cords visibly scarred on reintubation. -S/P trach 7/3 P: Continues to fail spontaneous breathing trial We will continue to try every day Started on 5-day course of 40 mg prednisone to help with wheezing and congestion  Acute blood loss anemia  - Jehovah's Witness, no blood products including albumin. Hemoglobin has stabilized in the 4 range.  No evidence of active bleeding. P: Continue severe anemia protocol: Iron, vitamin C, vitamin B12, folic acid, and Epogen Limit blood draws  Supportive care-spoke to hematology who expects recovery to be very slow. Hemoglobin is slowly improving  Uncontrolled diabetes with hyperglycemia Patient blood sugars have improved with adjustment of insulin P: Increase Lantus to 32 units once daily Continue NovoLog insulin   Daily Goals Checklist  Pain/Anxiety/Delirium protocol (if indicated): Wean Precedex off VAP protocol (if indicated): Bundle in place Respiratory support goals: Daily SBT Blood pressure target: Keep systolic blood pressure less than 160 DVT prophylaxis: Mechanical DVT prophylaxis only given anemia and high bleeding risk. Nutrition Status: Tolerating tube feeds.  Continue tube feeds.  Attempt to see over the course of this week whether she would be able to swallow once off sedation.  Otherwise we will have to proceed with PEG tube. GI prophylaxis: Pantoprazole Fluid status goals: Allow autoregulation Urinary catheter: External catheter Central lines: PICC line Glucose control: Adequate glycemic  control, type 2 diabetes. Mobility/therapy needs: PT OT Antibiotic de-escalation: No antibiotics Home medication reconciliation: On hold Daily labs: CBC daily. Code Status: Full code. Family Communication: Son Evaristo BuryKinya updated today by Dr. Merrily Pewhand.  [facility] Disposition: ICU.  Labs   CBC: Recent Labs  Lab 09/13/19 0500 09/15/19 1211 09/17/19 0703 09/18/19 0923  WBC 6.2 14.9* 17.5* 15.3*  HGB 4.3* 4.9* 5.6* 4.7*  HCT 15.4* 17.5* 19.0* 16.4*  MCV 113.2* 110.1* 105.0* 107.9*  PLT 366 524* 448* 435*    Basic Metabolic Panel: Recent Labs  Lab 09/13/19 0500 09/15/19 1211 09/17/19 0703 09/18/19 0923  NA 147* 143 142 142  K 4.1 3.8 4.0 4.4  CL 111 106 103 103  CO2 28 27 26 27   GLUCOSE 118* 262* 95 151*  BUN 24* 27* 38* 42*  CREATININE 0.73 0.78 0.84 1.01*  CALCIUM 8.6* 8.5* 8.7* 8.7*  MG  --   --   --  2.7*  PHOS  --   --   --  3.9   GFR: Estimated Creatinine Clearance: 55 mL/min (A) (by C-G formula based on SCr of 1.01 mg/dL (H)). Recent Labs  Lab 09/13/19 0500 09/15/19 1211 09/17/19 0703 09/18/19 0923  WBC 6.2 14.9* 17.5* 15.3*    Liver Function Tests: Recent Labs  Lab 09/13/19 0500 09/15/19 1211 09/17/19 0703  AST 23 20 32  ALT 24 26 23   ALKPHOS 55 72 82  BILITOT 0.6 0.8 1.1  PROT 5.8* 6.7 6.6  ALBUMIN 1.7* 1.7* 1.6*   No results for input(s): LIPASE, AMYLASE in the last 168 hours. No results for input(s): AMMONIA in the last 168 hours.  ABG    Component Value Date/Time   PHART 7.407 09/03/2019 2106   PCO2ART 60.9 (H) 09/03/2019 2106   PO2ART 87 09/03/2019 2106   HCO3 38.0 (H) 09/03/2019 2106   TCO2 40 (H) 09/03/2019 2106   ACIDBASEDEF 1.0 08/28/2019 0505   O2SAT 96.0 09/03/2019 2106     Coagulation Profile: No results for input(s): INR, PROTIME in the last 168 hours.  Cardiac Enzymes: No results for input(s): CKTOTAL, CKMB, CKMBINDEX, TROPONINI in the last 168 hours.  HbA1C: Hemoglobin A1C  Date/Time Value Ref Range Status  10/12/2017 12:00 AM 9.7  Final   Hgb A1c MFr Bld  Date/Time Value Ref Range Status  05/22/2019 01:12 PM 11.2 (H) 4.8 - 5.6 % Final    Comment:             Prediabetes: 5.7 - 6.4          Diabetes: >6.4          Glycemic control for adults with diabetes:  <7.0   11/27/2018 07:27 AM 11.3 (H) 4.8 - 5.6 % Final    Comment:    (NOTE)         Prediabetes: 5.7 - 6.4         Diabetes: >6.4         Glycemic control for adults with diabetes: <7.0     CBG: Recent Labs  Lab 09/18/19 1515 09/18/19 1908 09/18/19 2310 09/19/19 0327 09/19/19 0720  GLUCAP 162* 135* 132* 139* 103*    Critical care time:    Performed by: Cheri FowlerSudham Ruth Kovich  Total critical care time: 32 minutes  Critical care time was exclusive of separately billable procedures and treating other patients.  Critical care was necessary to treat or prevent imminent or life-threatening deterioration.  Critical care was time spent personally  by me on the following activities: development of treatment plan with patient and/or surrogate as well as nursing, discussions with consultants, evaluation of patient's response to treatment, examination of patient, obtaining history from patient or surrogate, ordering and performing treatments and interventions, ordering and review of laboratory studies, ordering and review of radiographic studies, pulse oximetry and re-evaluation of patient's condition.  Cheri Fowler MD Critical care physician [facility] Critical Care  Pager: [phone] Mobile: [phone]   09/19/2019, 9:36 AM

## 2019-09-19 NOTE — TOC Progression Note (Signed)
Transition of Care [facility]) - Progression Note    Patient Details  Name: Donna [name] MRN: [id number] Date of Birth: 04-21-1950  [facility]) CM/SW Contact  Astrid Drafts Berna Spare, RN Phone Number: 09/19/2019, 3:32 PM  Clinical Narrative: CM Referral for LTAC hospital received.  Spoke with son, Donna [name]: he does not want to make a decision today on LTAC ([facility]), but would like to hear from both admission liaisons to answer questions.  Referrals made to both area LTACS; son states he will have his decision made by Monday, July 12.        Expected Discharge Plan: Long Term Acute Care (LTAC) Barriers to Discharge: Continued Medical Work up  Expected Discharge Plan and Services Expected Discharge Plan: Long Term Acute Care (LTAC)                                               Social Determinants of Health (SDOH) Interventions    Readmission Risk Interventions No flowsheet data found.  Quintella Baton, RN, BSN  Trauma/Neuro ICU Case Manager [phone]

## 2019-09-20 DIAGNOSIS — A419 Sepsis, unspecified organism: Secondary | ICD-10-CM

## 2019-09-20 DIAGNOSIS — E876 Hypokalemia: Secondary | ICD-10-CM

## 2019-09-20 LAB — COMPREHENSIVE METABOLIC PANEL
ALT: 29 U/L (ref 0–44)
AST: 27 U/L (ref 15–41)
Albumin: 1.5 g/dL — ABNORMAL LOW (ref 3.5–5.0)
Alkaline Phosphatase: 95 U/L (ref 38–126)
Anion gap: 12 (ref 5–15)
BUN: 47 mg/dL — ABNORMAL HIGH (ref 8–23)
CO2: 28 mmol/L (ref 22–32)
Calcium: 8.5 mg/dL — ABNORMAL LOW (ref 8.9–10.3)
Chloride: 104 mmol/L (ref 98–111)
Creatinine, Ser: 0.97 mg/dL (ref 0.44–1.00)
GFR calc Af Amer: >60 mL/min (ref >60)
GFR calc non Af Amer: >60 mL/min (ref >60)
Glucose, Bld: 181 mg/dL — ABNORMAL HIGH (ref 70–99)
Potassium: 3.1 mmol/L — ABNORMAL LOW (ref 3.5–5.1)
Sodium: 144 mmol/L (ref 135–145)
Total Bilirubin: 0.7 mg/dL (ref 0.3–1.2)
Total Protein: 7.4 g/dL (ref 6.5–8.1)

## 2019-09-20 LAB — GLUCOSE, CAPILLARY
Glucose-Capillary: 178 mg/dL — ABNORMAL HIGH (ref 70–99)
Glucose-Capillary: 148 mg/dL — ABNORMAL HIGH (ref 70–99)
Glucose-Capillary: 144 mg/dL — ABNORMAL HIGH (ref 70–99)
Glucose-Capillary: 153 mg/dL — ABNORMAL HIGH (ref 70–99)
Glucose-Capillary: 154 mg/dL — ABNORMAL HIGH (ref 70–99)
Glucose-Capillary: 197 mg/dL — ABNORMAL HIGH (ref 70–99)

## 2019-09-20 LAB — CBC
HCT: 17.5 % — ABNORMAL LOW (ref 36.0–46.0)
Hemoglobin: 4.7 g/dL — CL (ref 12.0–15.0)
MCH: 29.6 pg (ref 26.0–34.0)
MCHC: 26.9 g/dL — ABNORMAL LOW (ref 30.0–36.0)
MCV: 110.1 fL — ABNORMAL HIGH (ref 80.0–100.0)
Platelets: 447 10*3/uL — ABNORMAL HIGH (ref 150–400)
RBC: 1.59 MIL/uL — ABNORMAL LOW (ref 3.87–5.11)
RDW: 20.2 % — ABNORMAL HIGH (ref 11.5–15.5)
WBC: 10.4 10*3/uL (ref 4.0–10.5)
nRBC: 6.4 % — ABNORMAL HIGH (ref 0.0–0.2)

## 2019-09-20 MED ORDER — POTASSIUM CHLORIDE 10 MEQ/100ML IV SOLN
10.0000 meq | INTRAVENOUS | Status: AC
  Administered 2019-09-20 (×3): 10 meq via INTRAVENOUS
  Filled 2019-09-20 (×3): qty 100

## 2019-09-20 MED ORDER — CYANOCOBALAMIN 500 MCG PO TABS
250.0000 ug | ORAL_TABLET | Freq: Every day | ORAL | Status: DC
  Administered 2019-09-20 – 2019-09-24 (×5): 250 ug
  Filled 2019-09-20 (×5): qty 1

## 2019-09-20 MED ORDER — FERROUS GLUCONATE 324 (38 FE) MG PO TABS
324.0000 mg | ORAL_TABLET | Freq: Three times a day (TID) | ORAL | Status: DC
  Filled 2019-09-20 (×2): qty 1

## 2019-09-20 MED ORDER — FOLIC ACID 1 MG PO TABS: 1.0000 mg | ORAL_TABLET | Freq: Every day | ORAL | Status: DC

## 2019-09-20 MED ORDER — FERROUS SULFATE 220 (44 FE) MG/5ML PO ELIX
220.0000 mg | ORAL_SOLUTION | Freq: Three times a day (TID) | ORAL | Status: DC
  Administered 2019-09-20 – 2019-10-22 (×98): 220 mg
  Filled 2019-09-20 (×105): qty 5

## 2019-09-20 NOTE — Progress Notes (Signed)
NAME:  Donna [name], MRN:  [id number], DOB:  04/22/1950, LOS: 26 ADMISSION DATE:  08/25/2019, CONSULTATION DATE:  08/27/2019 REFERRING MD:  Dr. 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  Admitted by Neuro IR on 6/14 for cerebral angiogram s/p RT ICA cervical/ petrous junction balloon angioplasty for severe stenosis via right femoral approach.  Was placed on ASA/ brillinta.  Monitored in Neuro ICU.  Noted to have developed hypotension, increased left sided weakness and lethargy on 6/15 am with Hgb drop from 13.3 to 6.9 with new AKI.  Post MRI/ MRA showed scattered small acute infarcts along the right MCA/ watershed territory suspected to be resultant either post procedure vs hypoperfusion.    A CT abd/ pelvis was obtained given concern for retroperitoneal bleed which showed a mild to moderate amount of retroperitoneal hematoma in the right iliac and inguinal regiones with moderate size hematoma noted the the soft tissues anterior to the musculature of the right hip; no definite intrapertioneal hemorrhage. INR, PT, and platelets rechecked and were wnl.  Vascular surgery was consulted with plans to observe given bleeding was mostly into the muscle and tissue.  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  7/4 Trached Consults:  NIR - primary Neurology (stroke  has signed off 6/24) Vascular surgery  PCCM Palliative Care Hematology  Procedures:    ETT 6/19 > 6/23,  6/23 > Trach 7/4  Significant Diagnostic Tests:   6/14 cerebral angiogram s/p RT ICA cer/petrous junction balloon angioplasty for severe stenosis.   6/15 MRA/ MRI brain  > Scattered small acute infarcts along the right MCA/watershed territory. Solitary small acute infarct in the left parietal cortex and right cerebellum. Improved right ICA patency at the petrous segment. Known severe right M1 segment stenosis. 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  6/15 CT a/p wo contrast > Mild to moderate amount of retroperitoneal hematoma is noted in the right iliac and inguinal regions. Moderate size hematoma is noted in the soft tissues anterior to the musculature of the right hip. No definite intraperitoneal hemorrhage is noted. Moderate size fat containing periumbilical hernia. Aortic Atherosclerosis.   Echo 6/16 > normal LVEF  6/29 MRI brain > Progressed size and confluence of ischemia throughout much of the right hemisphere white matter since 08/26/2019. Associated petechial blood products but no malignant hemorrhagic transformation or mass Effect. There are also multiple new lacunar type infarcts elsewhere, including the brainstem and the left basal ganglia. Underlying chronic infarcts of the right basal ganglia and left cerebellum.  Micro Data:  6/10 SARS 2 >> neg 6/14 MRSA PCR >> neg 6/20 trach asp >> few diptheroids/ corynebacterium species 6/18 BC x 2 >> neg 7/6 Morganella  Antimicrobials:  6/14 cefazolin pre-op  6/20 unasyn >> 09/02/2019 7/6 vancomycin >> 7/8 7/6 Zosyn>>  Interim history/subjective:  Patient feels less congested, ventilatory setting was adjusted to see if she can be liberated from ventilator, avoiding tissue  hypoxemia  Objective   Blood pressure (!) 143/84, pulse 87, temperature 98.8 F (37.1 C), temperature source Axillary, resp. rate (!)  23, height 5\' 1"  (1.549 m), weight 93.7 kg, SpO2 100 %.    Vent Mode: CPAP;PSV FiO2 (%):  [30 %] 30 % Set Rate:  [18 bmp] 18 bmp Vt Set:  [410 mL] 410 mL PEEP:  [5 cmH20] 5 cmH20 Pressure Support:  [8 cmH20-12 cmH20] 8 cmH20 Plateau Pressure:  [26 cmH20] 26 cmH20   Intake/Output Summary (Last 24 hours) at 09/20/2019 0911 Last data filed at 09/20/2019 0600 Gross per 24 hour  Intake 1997.42 ml  Output 1860 ml  Net 137.42 ml   Filed Weights   09/15/19 0500 09/19/19 0500 09/20/19 0458  Weight: 84.7 kg 91.5 kg 93.7 kg    Examination:  General: Chronically ill appearing deconditioned elderly female lying in bed on mechanical ventilation via trach in NAD  HEENT: 6 cuffed shiley trach midline, MM pink/moist, PERRL, sclera non-icteric  Neuro: Alert, eyes open and follows commands weakly on the right.  Minimal movement on left upper extremity. 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  Persistent left-sided weakness. P: PT/OT as able  Nutrition and bowel regiment  Seizure precautions  Continue secondary stroke prevention  Avoid anticoagulation or antiplatelets given high risk for bleed Continue atorvastatin Patient will need LTACh at discharge  Aspiration pneumonia, Is ruled in. Slowly improving P: Patient's respiratory culture grew Morganella Continue Zosyn to complete 7 days therapy  Acute respiratory failure with hypoxia  requiring mechanical ventilation.  -Failed trial of extubation due to upper airway stridor.  Cords visibly scarred on reintubation. -S/P trach 7/3 P:  Will continue to try to liberate her from ventilator by adjusting setting to prevent hypoxemia Complete 5-day course of 40 mg prednisone to help with wheezing and congestion  Acute blood loss anemia  - Jehovah's Witness, no blood products including albumin. Hemoglobin has stabilized in the 4 range.  No evidence of active bleeding. P: Continue severe anemia protocol: Iron, vitamin C, vitamin B12, folic acid, and Epogen Limit blood draws  Supportive care-spoke to hematology who expects recovery to be very slow. Hemoglobin is slowly improving  Uncontrolled diabetes with hyperglycemia Patient blood sugars have improved with adjustment of insulin P: Increase Lantus to 32 units once daily Continue NovoLog insulin   Daily Goals Checklist  Pain/Anxiety/Delirium protocol (if indicated): Wean Precedex VAP protocol (if indicated): Bundle in place Respiratory support goals: Daily SBT Blood pressure target: Keep systolic blood pressure less than 160 DVT prophylaxis: Mechanical DVT prophylaxis only given anemia and high bleeding risk. Nutrition Status: Tolerating tube feeds.  Continue tube feeds.  Attempt to see over the course of this week whether she would be able to swallow once off sedation.  Otherwise we will have to proceed with PEG tube. GI prophylaxis: Pantoprazole Fluid status goals: Allow autoregulation Urinary catheter: External catheter Central lines: PICC line Glucose control: Adequate glycemic control, type 2 diabetes. Mobility/therapy needs: PT OT  Antibiotic de-escalation: No antibiotics Home medication reconciliation: On hold Daily labs: CBC daily. Code Status: Full code. Family Communication: Son Evaristo Bury updated  Disposition: ICU.  Labs   CBC: Recent Labs  Lab 09/15/19 1211 09/17/19 0703 09/18/19 0923 09/20/19 0500   WBC 14.9* 17.5* 15.3* 10.4  HGB 4.9* 5.6* 4.7* 4.7*  HCT 17.5* 19.0* 16.4* 17.5*  MCV 110.1* 105.0* 107.9* 110.1*  PLT 524* 448* 435* 447*    Basic Metabolic Panel: Recent Labs  Lab 09/15/19 1211 09/17/19 0703 09/18/19 0923 09/20/19 0500  NA 143 142 142 144  K 3.8 4.0 4.4 3.1*  CL 106 103 103 104  CO2 27 26 27 28   GLUCOSE 262* 95 151* 181*  BUN 27* 38* 42* 47*  CREATININE 0.78 0.84 1.01* 0.97  CALCIUM 8.5* 8.7* 8.7* 8.5*  MG  --   --  2.7*  --   PHOS  --   --  3.9  --    GFR: Estimated Creatinine Clearance: 58 mL/min (by C-G formula based on SCr of 0.97 mg/dL). Recent Labs  Lab 09/15/19 1211 09/17/19 0703 09/18/19 0923 09/20/19 0500  WBC 14.9* 17.5* 15.3* 10.4    Liver Function Tests: Recent Labs  Lab 09/15/19 1211 09/17/19 0703 09/20/19 0500  AST 20 32 27  ALT 26 23 29   ALKPHOS 72 82 95  BILITOT 0.8 1.1 0.7  PROT 6.7 6.6 7.4  ALBUMIN 1.7* 1.6* 1.5*   No results for input(s): LIPASE, AMYLASE in the last 168 hours. No results for input(s): AMMONIA in the last 168 hours.  ABG    Component Value Date/Time   PHART 7.407 09/03/2019 2106   PCO2ART 60.9 (H) 09/03/2019 2106   PO2ART 87 09/03/2019 2106   HCO3 38.0 (H) 09/03/2019 2106   TCO2 40 (H) 09/03/2019 2106   ACIDBASEDEF 1.0 08/28/2019 0505   O2SAT 96.0 09/03/2019 2106     Coagulation Profile: No results for input(s): INR, PROTIME in the last 168 hours.  Cardiac Enzymes: No results for input(s): CKTOTAL, CKMB, CKMBINDEX, TROPONINI in the last 168 hours.  HbA1C: Hemoglobin A1C  Date/Time Value Ref Range Status  10/12/2017 12:00 AM 9.7  Final   Hgb A1c MFr Bld  Date/Time Value Ref Range Status  05/22/2019 01:12 PM 11.2 (H) 4.8 - 5.6 % Final    Comment:             Prediabetes: 5.7 - 6.4          Diabetes: >6.4          Glycemic control for adults with diabetes: <7.0   11/27/2018 07:27 AM 11.3 (H) 4.8 - 5.6 % Final    Comment:    (NOTE)         Prediabetes: 5.7 - 6.4         Diabetes:  >6.4         Glycemic control for adults with diabetes: <7.0     CBG: Recent Labs  Lab 09/19/19 1524 09/19/19 1912 09/19/19 2316 09/20/19 0310 09/20/19 0718  GLUCAP 195* 184* 210* 178* 148*    Performed by: 11/21/19  Total critical care time: 30 minutes  Critical care time was exclusive of separately billable procedures and treating other patients.  Critical care was necessary to treat or prevent imminent or life-threatening deterioration.  Critical care was time spent personally by me on the following activities: development of treatment plan with patient and/or surrogate as well as nursing, discussions with consultants, evaluation of patient's response to treatment, examination  of patient, obtaining history from patient or surrogate, ordering and performing treatments and interventions, ordering and review of laboratory studies, ordering and review of radiographic studies, pulse oximetry and re-evaluation of patient's condition.  Cheri FowlerSudham Caressa Scearce MD Critical care physician [facility]  Pager: [phone] Mobile: [phone]   09/20/2019, 9:11 AM

## 2019-09-21 LAB — CULTURE, BLOOD (ROUTINE X 2)
Culture: NO GROWTH
Culture: NO GROWTH
Special Requests: ADEQUATE

## 2019-09-21 LAB — COMPREHENSIVE METABOLIC PANEL
ALT: 36 U/L (ref 0–44)
AST: 31 U/L (ref 15–41)
Albumin: 1.6 g/dL — ABNORMAL LOW (ref 3.5–5.0)
Alkaline Phosphatase: 81 U/L (ref 38–126)
Anion gap: 9 (ref 5–15)
BUN: 41 mg/dL — ABNORMAL HIGH (ref 8–23)
CO2: 30 mmol/L (ref 22–32)
Calcium: 8.6 mg/dL — ABNORMAL LOW (ref 8.9–10.3)
Chloride: 103 mmol/L (ref 98–111)
Creatinine, Ser: 0.81 mg/dL (ref 0.44–1.00)
GFR calc Af Amer: >60 mL/min (ref >60)
GFR calc non Af Amer: >60 mL/min (ref >60)
Glucose, Bld: 145 mg/dL — ABNORMAL HIGH (ref 70–99)
Potassium: 3.3 mmol/L — ABNORMAL LOW (ref 3.5–5.1)
Sodium: 142 mmol/L (ref 135–145)
Total Bilirubin: 0.3 mg/dL (ref 0.3–1.2)
Total Protein: 7.1 g/dL (ref 6.5–8.1)

## 2019-09-21 LAB — GLUCOSE, CAPILLARY
Glucose-Capillary: 147 mg/dL — ABNORMAL HIGH (ref 70–99)
Glucose-Capillary: 106 mg/dL — ABNORMAL HIGH (ref 70–99)
Glucose-Capillary: 129 mg/dL — ABNORMAL HIGH (ref 70–99)
Glucose-Capillary: 147 mg/dL — ABNORMAL HIGH (ref 70–99)
Glucose-Capillary: 140 mg/dL — ABNORMAL HIGH (ref 70–99)
Glucose-Capillary: 121 mg/dL — ABNORMAL HIGH (ref 70–99)

## 2019-09-21 MED ORDER — QUETIAPINE FUMARATE 25 MG PO TABS
50.0000 mg | ORAL_TABLET | Freq: Every day | ORAL | Status: DC
  Administered 2019-09-21 – 2019-09-22 (×2): 50 mg
  Filled 2019-09-21 (×2): qty 2

## 2019-09-21 MED ORDER — HYDRALAZINE HCL 20 MG/ML IJ SOLN
10.0000 mg | INTRAMUSCULAR | Status: DC | PRN
  Administered 2019-09-21: 10 mg via INTRAVENOUS
  Administered 2019-09-22 – 2019-10-06 (×3): 20 mg via INTRAVENOUS
  Administered 2019-10-06 – 2019-10-09 (×2): 10 mg via INTRAVENOUS
  Administered 2019-10-10 – 2019-10-11 (×2): 20 mg via INTRAVENOUS
  Administered 2019-10-17: 10 mg via INTRAVENOUS
  Filled 2019-09-21 (×9): qty 1

## 2019-09-21 MED ORDER — OXYCODONE HCL 5 MG PO TABS
5.0000 mg | ORAL_TABLET | Freq: Four times a day (QID) | ORAL | Status: DC | PRN
  Filled 2019-09-21: qty 1

## 2019-09-21 MED ORDER — POTASSIUM CHLORIDE 10 MEQ/100ML IV SOLN
10.0000 meq | INTRAVENOUS | Status: AC
  Administered 2019-09-21 (×3): 10 meq via INTRAVENOUS
  Filled 2019-09-21 (×3): qty 100

## 2019-09-21 MED ORDER — IPRATROPIUM-ALBUTEROL 0.5-2.5 (3) MG/3ML IN SOLN
3.0000 mL | Freq: Four times a day (QID) | RESPIRATORY_TRACT | Status: DC | PRN
  Administered 2019-09-30: 3 mL via RESPIRATORY_TRACT
  Filled 2019-09-21: qty 3

## 2019-09-21 MED ORDER — HYDRALAZINE HCL 50 MG PO TABS
50.0000 mg | ORAL_TABLET | Freq: Three times a day (TID) | ORAL | Status: DC
  Administered 2019-09-21 – 2019-09-26 (×16): 50 mg
  Filled 2019-09-21 (×16): qty 1

## 2019-09-21 MED ORDER — QUETIAPINE FUMARATE 25 MG PO TABS
25.0000 mg | ORAL_TABLET | Freq: Two times a day (BID) | ORAL | Status: DC | PRN
  Administered 2019-09-21 – 2019-09-22 (×2): 25 mg
  Filled 2019-09-21 (×3): qty 1

## 2019-09-21 MED ORDER — OXYCODONE HCL 5 MG PO TABS
5.0000 mg | ORAL_TABLET | Freq: Four times a day (QID) | ORAL | Status: DC | PRN
  Administered 2019-09-21: 5 mg
  Filled 2019-09-21: qty 1

## 2019-09-21 MED ORDER — AMLODIPINE BESYLATE 10 MG PO TABS
10.0000 mg | ORAL_TABLET | Freq: Every day | ORAL | Status: DC
  Administered 2019-09-21 – 2019-11-01 (×40): 10 mg
  Filled 2019-09-21 (×42): qty 1

## 2019-09-21 NOTE — Progress Notes (Addendum)
NAME:  Donna [name], MRN:  [id number], DOB:  1950-05-24, LOS: 27 ADMISSION DATE:  08/25/2019, CONSULTATION DATE:  08/27/2019 REFERRING MD:  Dr. 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  Admitted by Neuro IR on 6/14 for cerebral angiogram s/p RT ICA cervical/ petrous junction balloon angioplasty for severe stenosis via right femoral approach.  Was placed on ASA/ brillinta.  Monitored in Neuro ICU.  Noted to have developed hypotension, increased left sided weakness and lethargy on 6/15 am with Hgb drop from 13.3 to 6.9 with new AKI.  Post MRI/ MRA showed scattered small acute infarcts along the right MCA/ watershed territory suspected to be resultant either post procedure vs hypoperfusion.    A CT abd/ pelvis was obtained given concern for retroperitoneal bleed which showed a mild to moderate amount of retroperitoneal hematoma in the right iliac and inguinal regiones with moderate size hematoma noted the the soft tissues anterior to the musculature of the right hip; no definite intrapertioneal hemorrhage. INR, PT, and platelets rechecked and were wnl.  Vascular surgery was consulted with plans to observe given bleeding was mostly into the muscle and tissue.  Became septic during this week due to pneumonia, now improving.   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  7/4 Trached Consults:  NIR -  primary Neurology (stroke has signed off 6/24) Vascular surgery  PCCM Palliative Care Hematology  Procedures:    ETT 6/19 > 6/23,  6/23 > Trach 7/4  Significant Diagnostic Tests:   6/14 cerebral angiogram s/p RT ICA cer/petrous junction balloon angioplasty for severe stenosis.   6/15 MRA/ MRI brain  > Scattered small acute infarcts along the right MCA/watershed territory. Solitary small acute infarct in the left parietal cortex and right cerebellum. Improved right ICA patency at the petrous segment. Known severe right M1 segment stenosis. 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  6/15 CT a/p wo contrast > Mild to moderate amount of retroperitoneal hematoma is noted in the right iliac and inguinal regions. Moderate size hematoma is noted in the soft tissues anterior to the musculature of the right hip. No definite intraperitoneal hemorrhage is noted. Moderate size fat containing periumbilical hernia. Aortic Atherosclerosis.   Echo 6/16 > normal LVEF  6/29 MRI brain > Progressed size and confluence of ischemia throughout much of the right hemisphere white matter since 08/26/2019. Associated petechial blood products but no malignant hemorrhagic transformation or mass Effect. There are also multiple new lacunar type infarcts elsewhere, including the brainstem and the left basal ganglia. Underlying chronic infarcts of the right basal ganglia and left cerebellum.  Micro Data:  6/10 SARS 2 >> neg 6/14 MRSA PCR >> neg 6/20 trach asp >> few diptheroids/ corynebacterium species 6/18 BC x 2 >> neg 7/6 Morganella  Antimicrobials:  6/14 cefazolin pre-op  6/20 unasyn >> 09/02/2019 7/6 vancomycin >> 7/8 7/6 Zosyn>> 7/12  Interim history/subjective:  Sepsis has resolved, still wheezing bilaterally.  Gets agitated intermittently, Precedex was turned off this morning She  is afebrile.  Failed pressure support trial yesterday, will try again today  Objective   Blood pressure (!)  156/81, pulse 74, temperature 98.5 F (36.9 C), temperature source Oral, resp. rate (!) 23, height 5\' 1"  (1.549 m), weight 94.8 kg, SpO2 100 %.    Vent Mode: CPAP;PSV FiO2 (%):  [30 %] 30 % Set Rate:  [18 bmp] 18 bmp Vt Set:  [410 mL] 410 mL PEEP:  [5 cmH20] 5 cmH20 Pressure Support:  [8 cmH20] 8 cmH20 Plateau Pressure:  [17 cmH20-25 cmH20] 19 cmH20   Intake/Output Summary (Last 24 hours) at 09/21/2019 0807 Last data filed at 09/21/2019 0700 Gross per 24 hour  Intake 2197.68 ml  Output 1525 ml  Net 672.68 ml   Filed Weights   09/19/19 0500 09/20/19 0458 09/21/19 0500  Weight: 91.5 kg 93.7 kg 94.8 kg    Examination:  General: Chronically ill appearing deconditioned elderly female lying in bed on mechanical ventilation via trach in NAD  HEENT: 6 cuffed shiley trach midline, MM pink/moist, PERRL, sclera non-icteric  Neuro: Alert, awake and follows commands weakly on the right.  Minimal movement on left upper extremity. Right gaze deviation which comes to the midline but does not cross CV: Tachycardic with regular rhythm, no murmur, rubs, or gallops,  PULM: Bilateral rhonchi all over with prolonged expiratory phase GI: soft, bowel sounds active in all 4 quadrants, non-tender, non-distended, tolerating TF Extremities: warm/dry, generalized non-pitting edema  Skin: no rashes or lesions   Resolved Hospital Problem list   Hypotension  AKI Sepsis  Assessment & Plan:   Right MCA CVA due to bilateral intracranial stenoses  -s/p RT ICA cer/petrous junction balloon angioplasty for severe stenosis c/b right thigh and retroperitoneal hematoma.  Persistent left-sided weakness. P: PT/OT as able Nutrition and bowel regiment  Seizure precautions  Continue secondary stroke prevention  Avoid anticoagulation or antiplatelets given high risk for bleed Continue atorvastatin Patient will need LTACh at discharge  Aspiration pneumonia, Is ruled in. Slowly improving P: Patient's  respiratory culture grew Morganella Continue Zosyn day 6 to complete 7 days therapy  Acute respiratory failure with hypoxia requiring mechanical ventilation.  -Failed trial of extubation due to upper airway stridor.  Cords visibly scarred on reintubation. -S/P trach 7/3 P:  Will continue to try to liberate her from ventilator by adjusting setting to prevent hypoxemia Complete 5-day course of 40 mg prednisone to help with wheezing and congestion Continue to fail spontaneous breathing trial.  Acute blood loss anemia  - Jehovah's Witness, no blood products including albumin. Hemoglobin has stabilized in the 4 range.  No evidence of active bleeding. P: Continue severe anemia protocol: Iron, vitamin C, vitamin B12, folic acid, and Epogen Limit blood draws  Supportive care-spoke to hematology who expects recovery to be very slow. Hemoglobin is is is stable between 4 and 5  Hypokalemia Electrolyte supplemented  Diabetes type 2 Patient blood sugars have improved with adjustment of insulin P: Continue Lantus to 32 units once daily Continue NovoLog insulin   Daily Goals Checklist  Pain/Anxiety/Delirium protocol (if indicated): Wean Precedex VAP protocol (if indicated): Bundle in place Respiratory support goals: Daily SBT Blood pressure target: Keep systolic blood pressure less than 160 DVT prophylaxis: Mechanical DVT prophylaxis only given anemia and high bleeding risk. Nutrition Status: Tolerating tube feeds.  Continue tube feeds.  Attempt to see over the course of this week whether she would be able to swallow once off sedation.  Otherwise we will have to proceed with PEG  tube. GI prophylaxis: Pantoprazole Fluid status goals: Allow autoregulation Urinary catheter: External catheter Central lines: PICC line Glucose control: Adequate glycemic control, type 2 diabetes. Mobility/therapy needs: PT OT Antibiotic de-escalation: No antibiotics Home medication reconciliation: On hold Daily  labs: CBC daily. Code Status: Full code. Family Communication: Son Evaristo Bury updated  Disposition: ICU.  Labs   CBC: Recent Labs  Lab 09/15/19 1211 09/17/19 0703 09/18/19 0923 09/20/19 0500  WBC 14.9* 17.5* 15.3* 10.4  HGB 4.9* 5.6* 4.7* 4.7*  HCT 17.5* 19.0* 16.4* 17.5*  MCV 110.1* 105.0* 107.9* 110.1*  PLT 524* 448* 435* 447*    Basic Metabolic Panel: Recent Labs  Lab 09/15/19 1211 09/17/19 0703 09/18/19 0923 09/20/19 0500 09/21/19 0500  NA 143 142 142 144 142  K 3.8 4.0 4.4 3.1* 3.3*  CL 106 103 103 104 103  CO2 27 26 27 28 30   GLUCOSE 262* 95 151* 181* 145*  BUN 27* 38* 42* 47* 41*  CREATININE 0.78 0.84 1.01* 0.97 0.81  CALCIUM 8.5* 8.7* 8.7* 8.5* 8.6*  MG  --   --  2.7*  --   --   PHOS  --   --  3.9  --   --    GFR: Estimated Creatinine Clearance: 69.9 mL/min (by C-G formula based on SCr of 0.81 mg/dL). Recent Labs  Lab 09/15/19 1211 09/17/19 0703 09/18/19 0923 09/20/19 0500  WBC 14.9* 17.5* 15.3* 10.4    Liver Function Tests: Recent Labs  Lab 09/15/19 1211 09/17/19 0703 09/20/19 0500 09/21/19 0500  AST 20 32 27 31  ALT 26 23 29  36  ALKPHOS 72 82 95 81  BILITOT 0.8 1.1 0.7 0.3  PROT 6.7 6.6 7.4 7.1  ALBUMIN 1.7* 1.6* 1.5* 1.6*   No results for input(s): LIPASE, AMYLASE in the last 168 hours. No results for input(s): AMMONIA in the last 168 hours.  ABG    Component Value Date/Time   PHART 7.407 09/03/2019 2106   PCO2ART 60.9 (H) 09/03/2019 2106   PO2ART 87 09/03/2019 2106   HCO3 38.0 (H) 09/03/2019 2106   TCO2 40 (H) 09/03/2019 2106   ACIDBASEDEF 1.0 08/28/2019 0505   O2SAT 96.0 09/03/2019 2106     Coagulation Profile: No results for input(s): INR, PROTIME in the last 168 hours.  Cardiac Enzymes: No results for input(s): CKTOTAL, CKMB, CKMBINDEX, TROPONINI in the last 168 hours.  HbA1C: Hemoglobin A1C  Date/Time Value Ref Range Status  10/12/2017 12:00 AM 9.7  Final   Hgb A1c MFr Bld  Date/Time Value Ref Range Status   05/22/2019 01:12 PM 11.2 (H) 4.8 - 5.6 % Final    Comment:             Prediabetes: 5.7 - 6.4          Diabetes: >6.4          Glycemic control for adults with diabetes: <7.0   11/27/2018 07:27 AM 11.3 (H) 4.8 - 5.6 % Final    Comment:    (NOTE)         Prediabetes: 5.7 - 6.4         Diabetes: >6.4         Glycemic control for adults with diabetes: <7.0     CBG: Recent Labs  Lab 09/20/19 1105 09/20/19 1515 09/20/19 1919 09/20/19 2258 09/21/19 0304  GLUCAP 144* 153* 154* 197* 147*    11/21/19 MD Critical care physician [facility]  Pager: [phone] Mobile: [phone]   09/21/2019, 8:07 AM

## 2019-09-21 NOTE — Progress Notes (Signed)
   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   Palliative care was asked to aid in goals of care conversations in the setting of patient having failed weaning trials from ventilatory support.  Today's Discussion (09/21/2019): Chart reviewed. I met with Donna [name] at bedside, she was resting comfortably on vent support.   I called patients son, Donna [name] to offer Palliative support. He shared with me that he remains hopeful for improvement in his mother. He states that Jehovah will guide her in terms of what or cannot be done. [name] and I reviewed the most recent PT/OT notes with one another. He shared that he is investigating the success statistics of all of the LTACH's before making a final decision. Offered therapeutic listening during our time together.   Discussed the importance of continued conversation with family and their  medical providers regarding overall plan of care and treatment options, ensuring decisions are within the context of the patients values and GOCs.  Questions and concerns addressed   Decision Maker: Donna [name] (Son) [phone]  SUMMARY OF RECOMMENDATIONS Full Code, full scope  Chaplain to provide ongoing [place] for transition to [facility] this week  PMT will continue to peripherally follow, please call directly if any immanent needs  Time Spent: 25 Greater than 50% of the time  was spent in counseling and coordination of care ______________________________________________________________________________________ [place] Team Team Cell Phone: [phone] Please utilize secure chat with additional questions, if there is no response within 30 minutes please call the above phone number  Palliative Medicine Team providers are available by phone from 7am to 7pm daily and can be reached through the team cell phone.  Should this patient require assistance outside of these hours, please call the patient's attending physician.

## 2019-09-21 NOTE — Progress Notes (Signed)
eLink Physician-Brief Progress Note Patient Name: Donna [name] DOB: 09-07-1950 MRN: [id number]   Date of Service  09/21/2019  HPI/Events of Note  Patient with elevated blood pressure and concern for sub-optimal pain control.  eICU Interventions  PRN Hydralazine order entered for hypertension, and a PRN order for Oxycodone entered for pain.        Thomasene Lot Kodi Steil 09/21/2019, 8:26 PM

## 2019-09-21 NOTE — Progress Notes (Addendum)
SBP 180s regardless of prn medications given.   Notified Dr. Merrily Pew who will review and give new orders for BP.

## 2019-09-22 ENCOUNTER — Inpatient Hospital Stay (HOSPITAL_COMMUNITY): Payer: Medicare HMO

## 2019-09-22 DIAGNOSIS — J9620 Acute and chronic respiratory failure, unspecified whether with hypoxia or hypercapnia: Secondary | ICD-10-CM

## 2019-09-22 LAB — GLUCOSE, CAPILLARY
Glucose-Capillary: 141 mg/dL — ABNORMAL HIGH (ref 70–99)
Glucose-Capillary: 128 mg/dL — ABNORMAL HIGH (ref 70–99)
Glucose-Capillary: 159 mg/dL — ABNORMAL HIGH (ref 70–99)
Glucose-Capillary: 143 mg/dL — ABNORMAL HIGH (ref 70–99)
Glucose-Capillary: 133 mg/dL — ABNORMAL HIGH (ref 70–99)
Glucose-Capillary: 122 mg/dL — ABNORMAL HIGH (ref 70–99)

## 2019-09-22 MED ORDER — ALTEPLASE 2 MG IJ SOLR
2.0000 mg | Freq: Once | INTRAMUSCULAR | Status: AC
  Administered 2019-09-22: 2 mg
  Filled 2019-09-22: qty 2

## 2019-09-22 MED ORDER — OXYCODONE HCL 5 MG PO TABS
5.0000 mg | ORAL_TABLET | Freq: Four times a day (QID) | ORAL | Status: DC | PRN
  Administered 2019-09-22 – 2019-11-01 (×17): 5 mg
  Filled 2019-09-22 (×17): qty 1

## 2019-09-22 MED ORDER — FUROSEMIDE 10 MG/ML IJ SOLN
40.0000 mg | Freq: Once | INTRAMUSCULAR | Status: AC
  Administered 2019-09-22: 40 mg via INTRAVENOUS
  Filled 2019-09-22: qty 4

## 2019-09-22 MED ORDER — POTASSIUM CHLORIDE 20 MEQ/15ML (10%) PO SOLN
40.0000 meq | Freq: Once | ORAL | Status: AC
  Administered 2019-09-22: 40 meq
  Filled 2019-09-22: qty 30

## 2019-09-22 NOTE — Progress Notes (Signed)
SLP Cancellation Note  Patient Details Name: ZURY FAZZINO MRN: [id number] DOB: [birth year]   Cancelled treatment:       Reason Eval/Treat Not Completed: Patient not medically ready. SLP continues to follow for potential to try PMV. This morning pt is still on the vent, receiving sedation and not ready for trials. Will f/u as able.   Mahala Menghini., M.A. CCC-SLP Acute Rehabilitation Services Pager [phone] Office [phone]  09/22/2019, 11:51 AM

## 2019-09-22 NOTE — TOC Progression Note (Signed)
Transition of Care [facility]) - Progression Note    Patient Details  Name: Donna [name] MRN: [id number] Date of Birth: September 28, 1950  [facility] CM/SW Contact  Astrid Drafts Berna Spare, RN Phone Number: 09/22/2019, 11:01 AM  Clinical Narrative:   Spoke with son, Evaristo Bury, this morning regarding LTAC choice:  Son states family is still in process of making decision, but assures me he will call me before the end of the day with LTAC choice.  Updated [facility] admission liaisons.     Expected Discharge Plan: Long Term Acute Care (LTAC) Barriers to Discharge: Continued Medical Work up  Expected Discharge Plan and Services Expected Discharge Plan: Long Term Acute Care (LTAC)                                               Social Determinants of Health (SDOH) Interventions    Readmission Risk Interventions No flowsheet data found.  Quintella Baton, RN, BSN  Trauma/Neuro ICU Case Manager [phone]

## 2019-09-22 NOTE — Progress Notes (Signed)
Referring Physician(s): Melvenia Beam  Supervising Physician: Luanne Bras  Patient Status:  [facility] - In-pt  Chief Complaint: None- tracheostomy, sedation  Subjective:  Right ICA cervical/petrous junction stenosis s/p revascularization using balloon angioplasty via right radial and right femoral approach6/14/2021by Dr. Estanislado Pandy. Mild-moderate right thigh (with some retroperitoneal component) hematoma. Acute blood loss anemia, Jehovah's Witness. Patient laying in bed, tracheostomy in place, sedated, undergoing pulmonary toilet.Sheopens eyes to voiceand minimally follows simple commands. Follows commands with right side (wiggles right toes, minimal right hand grip), demonstrates left-sided neglect. Right groin and right radial puncture sites c/d/i. Most recenthgbwas4.7 on 09/20/2019. No family at bedside this AM.   Allergies: Patient has no known allergies.  Medications: Prior to Admission medications   Medication Sig Start Date End Date Taking? Authorizing Provider  amLODipine (NORVASC) 10 MG tablet Take 1 tablet (10 mg total) by mouth daily. 05/12/19  Yes Minette Brine, FNP  aspirin EC 81 MG tablet Take 81 mg by mouth every evening.   Yes [provider]  Calcium Carbonate-Vitamin D (CALCIUM-D PO) Take 2 tablets by mouth daily at 12 noon.    Yes [provider]  carvedilol (COREG) 6.25 MG tablet TAKE 1 TABLET(6.25 MG) BY MOUTH TWICE DAILY Patient taking differently: Take 6.25 mg by mouth 2 (two) times daily with a meal.  07/31/19  Yes Minette Brine, FNP  hydrALAZINE (APRESOLINE) 50 MG tablet Take 1 tablet (50 mg total) by mouth 2 (two) times daily. 06/16/19 09/14/19 Yes Elouise Munroe, MD  Insulin Degludec-Liraglutide (XULTOPHY) 100-3.6 UNIT-MG/ML SOPN Inject 30 Units into the skin daily. 08/14/19  Yes Minette Brine, FNP  telmisartan-hydrochlorothiazide (MICARDIS HCT) 40-12.5 MG tablet Take 1 tablet by mouth daily. 07/31/19  Yes Minette Brine, FNP   ticagrelor (BRILINTA) 90 MG TABS tablet Take 1 tablet (90 mg total) by mouth 2 (two) times daily. 06/12/19  Yes Elouise Munroe, MD  vitamin C (ASCORBIC ACID) 250 MG tablet Take 500 mg by mouth daily at 12 noon.   Yes [provider]  blood glucose meter kit and supplies KIT Dispense based on patient and insurance preference. Use up to four times daily as directed. (FOR ICD-9 250.00, 250.01). 05/10/18   Minette Brine, FNP  Blood Glucose Monitoring Suppl (TRUE METRIX METER) w/Device KIT 1 each by Does not apply route in the morning, at noon, in the evening, and at bedtime. 05/22/19   Minette Brine, FNP  Evolocumab (REPATHA SURECLICK) 160 MG/ML SOAJ Inject 140 mg into the skin every 14 (fourteen) days. 06/30/19   Elouise Munroe, MD  glucose blood (TRUE METRIX BLOOD GLUCOSE TEST) test strip Check blood sugar 4 times a day before meals and bedtime 05/22/19   Minette Brine, FNP  Insulin Glargine-Lixisenatide (SOLIQUA) 100-33 UNT-MCG/ML SOPN Inject 25 Units into the skin daily. Patient taking differently: Inject 30 Units into the skin daily.  07/14/19   Minette Brine, FNP  Multiple Vitamin (MULTIVITAMIN PO) Take 1 tablet by mouth daily at 12 noon.     [provider]  Omega-3 Fatty Acids (OMEGA-3 PLUS PO) Take 2 capsules by mouth daily at 12 noon.    [provider]     Vital Signs: BP (!) 185/87 Comment: PRN pain med given  Pulse 96   Temp 98.6 F (37 C) (Axillary)   Resp 17   Ht 5' 1"  (1.549 m)   Wt 210 lb 1.6 oz (95.3 kg)   SpO2 100%   BMI 39.70 kg/m   Physical Exam Vitals  and nursing note reviewed.  Constitutional:      General: She is not in acute distress.    Comments: Tracheostomy. Sedated.  Pulmonary:     Effort: Pulmonary effort is normal. No respiratory distress.     Comments: Tracheostomy. Sedated. Skin:    General: Skin is warm and dry.     Comments: Bilateral upper extremities with anasarca (from hands to mid forearms). Right groin puncture site  soft without active bleeding or hematoma. Right radial puncture site soft without active bleeding or hematoma.    Neurological:     Comments: Tracheostomy. Sedated. She opens eyes to voice and minimally follows simple commands. More alert today. PERRL bilaterally. Follows commands with right side (wiggles right toes, minimal right hand grip), demonstrates left-sided neglect. Distal pulses (DPs) 1+ bilaterally and (radial) 2+ bilaterally.     Imaging: No results found.  Labs:  CBC: Recent Labs    09/15/19 1211 09/17/19 0703 09/18/19 0923 09/20/19 0500  WBC 14.9* 17.5* 15.3* 10.4  HGB 4.9* 5.6* 4.7* 4.7*  HCT 17.5* 19.0* 16.4* 17.5*  PLT 524* 448* 435* 447*    COAGS: Recent Labs    11/27/18 0728 08/18/19 0705 08/26/19 1715 08/27/19 0230  INR 0.9 0.9 1.1 1.2    BMP: Recent Labs    09/17/19 0703 09/18/19 0923 09/20/19 0500 09/21/19 0500  NA 142 142 144 142  K 4.0 4.4 3.1* 3.3*  CL 103 103 104 103  CO2 26 27 28 30   GLUCOSE 95 151* 181* 145*  BUN 38* 42* 47* 41*  CALCIUM 8.7* 8.7* 8.5* 8.6*  CREATININE 0.84 1.01* 0.97 0.81  GFRNONAA >60 57* >60 >60  GFRAA >60 >60 >60 >60    LIVER FUNCTION TESTS: Recent Labs    09/15/19 1211 09/17/19 0703 09/20/19 0500 09/21/19 0500  BILITOT 0.8 1.1 0.7 0.3  AST 20 32 27 31  ALT 26 23 29  36  ALKPHOS 72 82 95 81  PROT 6.7 6.6 7.4 7.1  ALBUMIN 1.7* 1.6* 1.5* 1.6*    Assessment and Plan:  Right ICA cervical/petrous junction stenosis s/p revascularization using balloon angioplasty via right radial and right femoral approach6/14/2021by Dr. Estanislado Pandy. Mild-moderate right thigh (with some retroperitoneal component) hematoma. Acute blood loss anemia, Jehovah's Witness. Patient's condition stable-tracheostomy with sedation,opens eyes to voice and minimally follows simple commands, can wiggle right toes and minimal right hand grip to command, demonstrates left-sided neglect. Right groinand right radial puncture  sitesstable, no signs of active bleeding. Hgb 4.7 on 09/20/2019- receiving supplementation per severe anemia protocol. Continue to hold Brilinta/Aspirin/Lovenox.Agree with CCM plans toavoid excess blood drawl. Further plans per CCM/neurology/palliative care- appreciate and agree with management. NIR to follow.   Electronically Signed: Earley Abide, PA-C 09/22/2019, 9:28 AM   I spent a total of 25 Minutes at the the patient's bedside AND on the patient's hospital floor or unit, greater than 50% of which was counseling/coordinating care for right ICA stenosis s/p revascularization.

## 2019-09-22 NOTE — Progress Notes (Addendum)
NAME:  Donna [name], MRN:  [id number], DOB:  02-26-1951, LOS: 28 ADMISSION DATE:  08/25/2019, CONSULTATION DATE:  08/27/2019 REFERRING MD:  Dr. 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  Admitted by Neuro IR on 6/14 for cerebral angiogram s/p RT ICA cervical/ petrous junction balloon angioplasty for severe stenosis via right femoral approach.  Was placed on ASA/ brillinta.  Monitored in Neuro ICU.  Noted to have developed hypotension, increased left sided weakness and lethargy on 6/15 am with Hgb drop from 13.3 to 6.9 with new AKI.  Post MRI/ MRA showed scattered small acute infarcts along the right MCA/ watershed territory suspected to be resultant either post procedure vs hypoperfusion.    A CT abd/ pelvis was obtained given concern for retroperitoneal bleed which showed a mild to moderate amount of retroperitoneal hematoma in the right iliac and inguinal regiones with moderate size hematoma noted the the soft tissues anterior to the musculature of the right hip; no definite intrapertioneal hemorrhage. INR, PT, and platelets rechecked and were wnl.  Vascular surgery was consulted with plans to observe given bleeding was mostly into the muscle and tissue.  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   7/4 - TRACHEOSTOMY  7/11 - pall  care meeting - full code. Patient feels less congested, ventilatory setting was adjusted to see if she can be liberated from ventilator, avoiding tissue hypoxemia  Consults:  NIR - primary Neurology (stroke has signed off 6/24) Vascular surgery  PCCM Palliative Care Hematology  Procedures:    ETT 6/19 > 6/23,  6/23 > Trach 7/4  Significant Diagnostic Tests:   6/14 cerebral angiogram s/p RT ICA cer/petrous junction balloon angioplasty for severe stenosis.   6/15 MRA/ MRI brain  > Scattered small acute infarcts along the right MCA/watershed territory. Solitary small acute infarct in the left parietal cortex and right cerebellum. Improved right ICA patency at the petrous segment. Known severe right M1 segment stenosis. 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  6/15 CT a/p wo contrast > Mild to moderate amount of retroperitoneal hematoma is noted in the right iliac and inguinal regions. Moderate size hematoma is noted in the soft tissues anterior to the musculature of the right hip. No definite intraperitoneal hemorrhage is noted. Moderate size fat containing periumbilical hernia. Aortic Atherosclerosis.   Echo 6/16 > normal LVEF  6/29 MRI brain > Progressed size and confluence of ischemia throughout much of the right hemisphere white matter since 08/26/2019. Associated petechial blood products but no malignant hemorrhagic transformation or mass Effect. There are also multiple new lacunar type infarcts elsewhere, including the brainstem and the left basal ganglia. Underlying chronic infarcts of the right basal ganglia and left Cerebellum.    Micro Data:  6/10 SARS 2 >> neg 6/14 MRSA PCR >> neg 6/20 trach asp >> few diptheroids/ corynebacterium species 6/18 BC x 2 >> neg 7/6 Morganella  Antimicrobials:  6/14 cefazolin pre-op  6/20 unasyn >> 09/02/2019 7/6 vancomycin >> 7/8 7/6 Zosyn>>  Interim history/subjective:    7/12 - afebrile since 7/8. + 22L since admit.  RN feels patient volume overloaded.Does SBT dailly of varying duration few to several hours and then gets fatigued with tachypnea and tachycardia and agitation. Left sided dense hemiplegia continues. Periodically pruposeful on right side PRecedex continues but unsure if patient needs it  Diarrhea + - on docusate  Objective   Blood pressure (!) 144/69, pulse 74, temperature 98.9 F (37.2 C), temperature source Axillary, resp. rate (!) 25, height 5\' 1"  (1.549 m), weight 95.3 kg, SpO2 99 %.    Vent Mode: CPAP;PSV FiO2 (%):  [30 %] 30 % Set Rate:  [18 bmp] 18 bmp Vt Set:  [410 mL] 410 mL PEEP:  [5 cmH20] 5 cmH20 Pressure Support:  [8 cmH20] 8 cmH20 Plateau Pressure:  [13 cmH20] 13 cmH20   Intake/Output Summary (Last 24 hours) at 09/22/2019 0728 Last data filed at 09/22/2019 0600 Gross per 24 hour  Intake 2433.78 ml  Output 1475 ml  Net 958.78 ml   Filed Weights   09/20/19 0458 09/21/19 0500 09/22/19 0500  Weight: 93.7 kg 94.8 kg 95.3 kg    Examination:     - Obese lady . On ventilatir via trach. 2-3+ diffuse edema +. 11/23/19 site is clean. Eyes open but not following commands (on precedex). Faint distant wheeze +. Normal heart sounds. Abd soft.   Resolved Hospital Problem list   Hypotension  AKI Sepsis  Assessment & Plan:   Right MCA CVA due to bilateral intracranial stenoses  -s/p RT ICA cer/petrous junction balloon angioplasty for severe stenosis c/b right thigh and retroperitoneal hematoma.   7/12 - Persistent left-sided weakness.Awake   P: PT/OT as able  Nutrition and bowel regiment  Seizure precautions  Continue secondary stroke prevention  Avoid anticoagulation or antiplatelets given high risk for bleed Continue atorvastatin   Acute encephalopathy  7/12  -get s agitated occasionally. On precedex gtt and daily seroquel with prn fent/oxycodeone  Plan - wean off precedex gtt  - cotninue seroquel  - fent prn  - dc oxycodone  VAP - Morganella   7/12 -  Afebrile since 09/18/19. Slowly improving P:  Continue Zosyn to complete 7 days therapy  Acute on chronic respiratory failure with hypoxia requiring mechanical ventilation.  -Failed trial of extubation due to upper airway stridor.  Cords visibly scarred on reintubation. S/P trach 7/4  09/22/2019 - variable abilioty to do SBT - few to several hours. Stroke, VAP and volume overload all weighing in. Mild wheezing + despite prednisone (5d) since 7/9.   P: SBT as tolerated; PRVC otherwise Complete 5-day course of 40 mg prednisone to help with wheezing and congestion  Diurese (volume overload likely reason for wheeze)  Volume Overload  7/12 - 22L since positive since admit  Plan  - dc free water  - lasix x 1 -  Daily decision on lasix   Acute blood loss anemia  -> Anemia of critical illness : - Jehovah's Witness, no blood products including albumin. .   7/12 -  No evidence of active bleeding. Most recent  hgb 4-5gm%  P: Continue severe anemia protocol: Iron, vitamin C, vitamin B12, folic acid, and Epogen Limit blood draws to every 4-7 days as needed Supportive care-spoke to hematology who expects recovery to be very slow.  Diarrhea  Plan  - stop laxatives   Uncontrolled diabetes with hyperglycemia Patient blood sugars  have improved with adjustment of insulin P: Increase Lantus to 32 units once daily Continue NovoLog insulin   Daily Goals Checklist  Pain/Anxiety/Delirium protocol (if indicated): Wean Precedex  VAP protocol (if indicated): Bundle in place  Respiratory support goals: Daily SBT  Blood pressure target: Keep systolic blood pressure less than 160  DVT prophylaxis: Mechanical DVT prophylaxis only given anemia and high bleeding risk.  Nutrition Status: Tolerating tube feeds.  Continue tube feeds.  Attempt to see over the course of this week whether she would be able to swallow once off sedation.  Otherwise we will have to proceed with PEG tube.  GI  prophylaxis: Pantoprazole   Urinary catheter: External catheter  Central lines: PICC line  Glucose control: Adequate glycemic control, type 2 diabetes.  Mobility/therapy needs: PT OT   Daily labs NO  Code Status: Full code.  Family Communication: Son Donna [name] updated -> he had no questions but said he is working on choosing an LTAC  Disposition: ICU. (needs LTAC - case manager working on this with family)     ATTESTATION & SIGNATURE   The patient HALAINA VANDUZER is critically ill with multiple organ systems failure and requires high complexity decision making for assessment and support, frequent evaluation and titration of therapies, application of advanced monitoring technologies and extensive interpretation of multiple databases.   Critical Care Time devoted to patient care services described in this note is  31  Minutes. This time reflects time of care of this signee Dr Kalman Shan. This critical care time does not reflect procedure time, or teaching time or supervisory time of PA/NP/Med student/Med Resident etc but could involve care discussion time     Dr. Kalman Shan, M.D., [facility].C.P Pulmonary and Critical Care Medicine Staff Physician [facility] Pager: [phone], If no answer or between  15:00h - 7:00h: call [phone]  09/22/2019 7:28 AM     LABS    PULMONARY No results for input(s): PHART, PCO2ART, PO2ART, HCO3, TCO2, O2SAT in the last 168 hours.  Invalid input(s): PCO2, PO2  CBC Recent Labs  Lab 09/17/19 0703 09/18/19 0923 09/20/19 0500  HGB 5.6* 4.7* 4.7*  HCT 19.0* 16.4* 17.5*  WBC 17.5* 15.3* 10.4  PLT 448* 435* 447*    COAGULATION No results for input(s): INR in the last 168 hours.  CARDIAC  No results for input(s): TROPONINI in the last 168 hours. No results for input(s): PROBNP in the last 168 hours.   CHEMISTRY Recent Labs  Lab 09/15/19 1211 09/15/19 1211  09/17/19 0703 09/17/19 0703 09/18/19 0923 09/18/19 0923 09/20/19 0500 09/21/19 0500  NA 143  --  142  --  142  --  144 142  K 3.8   < > 4.0   < > 4.4   < > 3.1* 3.3*  CL 106  --  103  --  103  --  104 103  CO2 27  --  26  --  27  --  28 30  GLUCOSE 262*  --  95  --  151*  --  181* 145*  BUN 27*  --  38*  --  42*  --  47* 41*  CREATININE 0.78  --  0.84  --  1.01*  --  0.97 0.81  CALCIUM 8.5*  --  8.7*  --  8.7*  --  8.5* 8.6*  MG  --   --   --   --  2.7*  --   --   --   PHOS  --   --   --   --  3.9  --   --   --    < > = values in this interval not displayed.   Estimated Creatinine Clearance: 70.1 mL/min (by C-G formula based on SCr of 0.81 mg/dL).   LIVER Recent Labs  Lab 09/15/19 1211 09/17/19 0703 09/20/19 0500 09/21/19 0500  AST 20 32 27 31  ALT 26 23 29  36  ALKPHOS 72 82 95 81  BILITOT 0.8 1.1 0.7 0.3  PROT 6.7 6.6 7.4 7.1  ALBUMIN 1.7* 1.6* 1.5* 1.6*     INFECTIOUS No results for input(s): LATICACIDVEN, PROCALCITON in the last 168 hours.   ENDOCRINE CBG (last 3)  Recent Labs    09/21/19 1926 09/21/19 2302 09/22/19 0307  GLUCAP 140* 121* 141*         IMAGING x48h  - image(s) personally visualized  -   highlighted in bold No results found.

## 2019-09-22 NOTE — Progress Notes (Signed)
VAST consulted to assess LA TL PICC d/t difficulty flushing. Assessed and changed caps on each lumen: Red lumen with GBR and flushed easily, Gray lumen without blood return but flushed easily, white lumen without blood return but flushed easily. Spoke with pt's nurse and suggested physician be contacted to obtain chest x-ray for PICC tip placement. Asked RN to place IV team consult once x-ray completed so we can further assess PICC and plan for treatment.

## 2019-09-23 DIAGNOSIS — J9600 Acute respiratory failure, unspecified whether with hypoxia or hypercapnia: Secondary | ICD-10-CM

## 2019-09-23 LAB — GLUCOSE, CAPILLARY
Glucose-Capillary: 103 mg/dL — ABNORMAL HIGH (ref 70–99)
Glucose-Capillary: 108 mg/dL — ABNORMAL HIGH (ref 70–99)
Glucose-Capillary: 120 mg/dL — ABNORMAL HIGH (ref 70–99)
Glucose-Capillary: 131 mg/dL — ABNORMAL HIGH (ref 70–99)
Glucose-Capillary: 143 mg/dL — ABNORMAL HIGH (ref 70–99)
Glucose-Capillary: 184 mg/dL — ABNORMAL HIGH (ref 70–99)
Glucose-Capillary: 64 mg/dL — ABNORMAL LOW (ref 70–99)

## 2019-09-23 MED ORDER — FUROSEMIDE 10 MG/ML IJ SOLN
40.0000 mg | Freq: Once | INTRAMUSCULAR | Status: AC
  Administered 2019-09-23: 40 mg via INTRAVENOUS
  Filled 2019-09-23: qty 4

## 2019-09-23 MED ORDER — CLONIDINE HCL 0.2 MG PO TABS
0.2000 mg | ORAL_TABLET | Freq: Four times a day (QID) | ORAL | Status: DC
  Filled 2019-09-23: qty 1

## 2019-09-23 MED ORDER — DEXTROSE 50 % IV SOLN
INTRAVENOUS | Status: AC
  Administered 2019-09-23: 25 mL
  Filled 2019-09-23: qty 50

## 2019-09-23 MED ORDER — QUETIAPINE FUMARATE 100 MG PO TABS
100.0000 mg | ORAL_TABLET | Freq: Every day | ORAL | Status: DC
  Administered 2019-09-23 – 2019-10-27 (×32): 100 mg
  Filled 2019-09-23 (×32): qty 1

## 2019-09-23 MED ORDER — POTASSIUM CHLORIDE 20 MEQ/15ML (10%) PO SOLN
40.0000 meq | Freq: Once | ORAL | Status: AC
  Administered 2019-09-23: 40 meq
  Filled 2019-09-23: qty 30

## 2019-09-23 MED ORDER — VITAL AF 1.2 CAL PO LIQD
1000.0000 mL | ORAL | Status: AC
  Administered 2019-09-23: 1000 mL

## 2019-09-23 MED ORDER — DEXTROSE 10 % IV SOLN: INTRAVENOUS | Status: DC

## 2019-09-23 MED ORDER — CLONIDINE HCL 0.2 MG PO TABS
0.2000 mg | ORAL_TABLET | Freq: Four times a day (QID) | ORAL | Status: DC
  Administered 2019-09-23 – 2019-09-25 (×8): 0.2 mg
  Filled 2019-09-23 (×7): qty 1

## 2019-09-23 NOTE — Progress Notes (Signed)
Pt placed on 35% ATC.

## 2019-09-23 NOTE — Progress Notes (Signed)
Trach sutures remover per order.  Pt tolerated procedure well.

## 2019-09-23 NOTE — TOC Progression Note (Addendum)
Transition of Care [facility]) - Progression Note    Patient Details  Name: Donna [name] MRN: [id number] Date of Birth: [birth year]  Transition of [facility]) CM/SW Contact  Glennon Mac, RN Phone Number: 09/23/2019, 11:45 am Clinical Narrative:  Sherron Monday with son, Evaristo Bury, regarding LTAC choice, as he did not call TOC Case Manager back yesterday.  Evaristo Bury states that his aunt will be touring one of the facilities today, and he will have an answer on LTAC choice tomorrow.  Will follow up with son in AM regarding LTAC choice.      Expected Discharge Plan: Long Term Acute Care (LTAC) Barriers to Discharge: Continued Medical Work up  Expected Discharge Plan and Services Expected Discharge Plan: Long Term Acute Care (LTAC)                                               Social Determinants of Health (SDOH) Interventions    Readmission Risk Interventions No flowsheet data found.   Quintella Baton, RN, BSN  Trauma/Neuro ICU Case Manager [phone]

## 2019-09-23 NOTE — Progress Notes (Addendum)
Patient CBG 64. RN gave 12.5 g of 50% dextrose. RN called Elink at this time to have insulin orders changed and/or dextrose infusion started due to patient now having tube feeding at 10 mL/hr instead of 50 ml/hr. Patient alert, no neuro changes, and comfortable.   CBG recheck: 120. Dr. Arsenio Loader put in orders for D10 at 30 ml/hr. Tube feeding novolog coverage to be kept in [facility] but RN will hold until tube feedings are back up to goal rate. Will continue to monitor patient and recheck CBG at 0100.   0100 CBG: 98.

## 2019-09-23 NOTE — Progress Notes (Signed)
Physical Therapy Treatment Patient Details Name: Donna [name] MRN: [id number] DOB: 1950-10-17 Today's Date: 09/23/2019    History of Present Illness 69 y.o. female with PMH significant for HTN, HLD, CVA 06/2018, DM, anemia, obesity, OSA, and OA. She is [facility] to [facility] and has been followed by Dr. Corliss Skains since 10/2018. She first presented to our department at the request of Dr. Lucia Gaskins for management of intracranial stenosis, thought to be cause of recent CVA at that time. Pt underwent R ICA angioplasty on 6/14. Pt with decline in neuro status and hemoglobin on 6/15, found to have a large R groin hematoma.  Pt has had Hbg 5.0 or less and is a Jehovah's witness.  She was intubated on 6/19. Pt underwent tracheostomy on 7/3 and remains ventilated. Pt found to be septic with RLL PNA on 09/16/2019    PT Comments    Patient progressing slowly towards PT goals. Pt alert and participatory during today's session. Continues to demonstrate left hemiplegia, inattention and right gaze preference. Able to follow ~50% of commands with delayed response time. Continues to demonstrate impaired attention needing constant stimulus to stay attended to task. Worked with pt while in chair position in bed. Able to partake in there ex of right side, cervical AROM and trunk activation. VSS on trach collar. Left in chair position in bed with RN present. Will follow.   Follow Up Recommendations  SNF     Equipment Recommendations  Wheelchair (measurements PT);Wheelchair cushion (measurements PT);Hospital bed    Recommendations for Other Services       Precautions / Restrictions Precautions Precautions: Fall Precaution Comments: trach, low hbg Required Braces or Orthoses: Other Brace Other Brace: PRAFO LLE Restrictions Weight Bearing Restrictions: No    Mobility  Bed Mobility Overal bed mobility: Needs Assistance Bed Mobility: Supine to Sit     Supine to sit: Total assist     General bed mobility comments: pt  assisted into bed in chair position,worked on pulling trunk forward using RUE to clear back from bed x2; able to initiate but not sustain.  Transfers                 General transfer comment: unable to attempt   Ambulation/Gait                 Stairs             Wheelchair Mobility    Modified Rankin (Stroke Patients Only) Modified Rankin (Stroke Patients Only) Pre-Morbid Rankin Score: No symptoms Modified Rankin: Severe disability     Balance Overall balance assessment: Needs assistance Sitting-balance support: Feet unsupported;Bilateral upper extremity supported Sitting balance-Leahy Scale: Zero Sitting balance - Comments: totalA with bed in chair position. Worked on cervical AAROM rotation. Worked on activating lateral trunk flexors and leaning to right (pulling with RUE on bed rail) onto forearm and pushing off to get back to midline. Able to initiate movement. Postural control: Posterior lean                                  Cognition Arousal/Alertness: Awake/alert Behavior During Therapy: Flat affect Overall Cognitive Status: Difficult to assess Area of Impairment: Attention;Following commands                   Current Attention Level: Focused (progressing to moments of sustained)   Following Commands: Follows one step commands with increased time     Problem  Solving: Slow processing General Comments: Follows commands with increased time and cues. No AROM on left side. Withdraws to noxious stimulus on LUE. Able to give a thumbs up and nod yes/no to command. Follows ~50% of motor commands with increased time. Right gaze preference but able to get to midline and by end of session gaze left for periods but not sustain.      Exercises General Exercises - Upper Extremity Shoulder Flexion: AROM;Right;PROM;Both;10 reps;Supine Shoulder Extension: PROM;Left;10 reps;Supine;AROM;Right Elbow Flexion: AROM;PROM;Both;10  reps;Supine Elbow Extension: PROM;Both;10 reps;AROM Digit Composite Flexion: AROM;Right;10 reps Composite Extension: AROM;Right;10 reps General Exercises - Lower Extremity Ankle Circles/Pumps: AROM;Right;5 reps;Seated Long Arc Quad: AROM;Right;5 reps;Seated    General Comments General comments (skin integrity, edema, etc.): Pt on trach collar, VSS.      Pertinent Vitals/Pain Pain Assessment: Faces Faces Pain Scale: No hurt    Home Living                      Prior Function            PT Goals (current goals can now be found in the care plan section) Progress towards PT goals: Progressing toward goals (slowly)    Frequency    Min 2X/week      PT Plan Current plan remains appropriate    Co-evaluation              AM-PAC PT "6 Clicks" Mobility   Outcome Measure  Help needed turning from your back to your side while in a flat bed without using bedrails?: Total Help needed moving from lying on your back to sitting on the side of a flat bed without using bedrails?: Total Help needed moving to and from a bed to a chair (including a wheelchair)?: Total Help needed standing up from a chair using your arms (e.g., wheelchair or bedside chair)?: Total Help needed to walk in hospital room?: Total Help needed climbing 3-5 steps with a railing? : Total 6 Click Score: 6    End of Session Equipment Utilized During Treatment: Oxygen (TC) Activity Tolerance: Patient tolerated treatment well Patient left: in bed;with nursing/sitter in room;with SCD's reapplied;with bed alarm set;with call bell/phone within reach;Other (comment) (bed in chair position; PRAFO donned) Nurse Communication: Mobility status;Need for lift equipment PT Visit Diagnosis: Other abnormalities of gait and mobility (R26.89);Muscle weakness (generalized) (M62.81);Other symptoms and signs involving the nervous system (R29.898);Hemiplegia and hemiparesis Hemiplegia - Right/Left: Left Hemiplegia -  dominant/non-dominant: Non-dominant Hemiplegia - caused by: Cerebral infarction     Time: 1350-1413 PT Time Calculation (min) (ACUTE ONLY): 23 min  Charges:  $Therapeutic Exercise: 8-22 mins $Therapeutic Activity: 8-22 mins                     Vale Haven, PT, DPT Acute Rehabilitation Services Pager [phone] Office [phone]       Blake Divine A Lanier Ensign 09/23/2019, 4:00 PM

## 2019-09-23 NOTE — Progress Notes (Signed)
Referring Physician(s): Melvenia Beam  Supervising Physician: Luanne Bras  Patient Status:  [facility] - In-pt  Chief Complaint: None-tracheostomy,sedation  Subjective:  Right ICA cervical/petrous junction stenosis s/p revascularization using balloon angioplasty via right radial and right femoral approach6/14/2021by Dr. Estanislado Pandy. Mild-moderate right thigh (with some retroperitoneal component) hematoma. Acute blood loss anemia, Jehovah's Witness. Patient laying in bed, tracheostomy in place, sedated.Sheopens eyes to voiceandminimallyfollows simple commands. Follows commands with right side (wiggles right toes, minimal right hand grip), demonstratesleft-sided neglect. Right groin and right radial puncture sites c/d/i. Most recenthgbwas4.7 on 09/20/2019. No family at bedside this AM.   Allergies: Patient has no known allergies.  Medications: Prior to Admission medications   Medication Sig Start Date End Date Taking? Authorizing Provider  amLODipine (NORVASC) 10 MG tablet Take 1 tablet (10 mg total) by mouth daily. 05/12/19  Yes Minette Brine, FNP  aspirin EC 81 MG tablet Take 81 mg by mouth every evening.   Yes [provider]  Calcium Carbonate-Vitamin D (CALCIUM-D PO) Take 2 tablets by mouth daily at 12 noon.    Yes [provider]  carvedilol (COREG) 6.25 MG tablet TAKE 1 TABLET(6.25 MG) BY MOUTH TWICE DAILY Patient taking differently: Take 6.25 mg by mouth 2 (two) times daily with a meal.  07/31/19  Yes Minette Brine, FNP  hydrALAZINE (APRESOLINE) 50 MG tablet Take 1 tablet (50 mg total) by mouth 2 (two) times daily. 06/16/19 09/14/19 Yes Elouise Munroe, MD  Insulin Degludec-Liraglutide (XULTOPHY) 100-3.6 UNIT-MG/ML SOPN Inject 30 Units into the skin daily. 08/14/19  Yes Minette Brine, FNP  telmisartan-hydrochlorothiazide (MICARDIS HCT) 40-12.5 MG tablet Take 1 tablet by mouth daily. 07/31/19  Yes Minette Brine, FNP  ticagrelor (BRILINTA) 90 MG  TABS tablet Take 1 tablet (90 mg total) by mouth 2 (two) times daily. 06/12/19  Yes Elouise Munroe, MD  vitamin C (ASCORBIC ACID) 250 MG tablet Take 500 mg by mouth daily at 12 noon.   Yes [provider]  blood glucose meter kit and supplies KIT Dispense based on patient and insurance preference. Use up to four times daily as directed. (FOR ICD-9 250.00, 250.01). 05/10/18   Minette Brine, FNP  Blood Glucose Monitoring Suppl (TRUE METRIX METER) w/Device KIT 1 each by Does not apply route in the morning, at noon, in the evening, and at bedtime. 05/22/19   Minette Brine, FNP  Evolocumab (REPATHA SURECLICK) 800 MG/ML SOAJ Inject 140 mg into the skin every 14 (fourteen) days. 06/30/19   Elouise Munroe, MD  glucose blood (TRUE METRIX BLOOD GLUCOSE TEST) test strip Check blood sugar 4 times a day before meals and bedtime 05/22/19   Minette Brine, FNP  Insulin Glargine-Lixisenatide (SOLIQUA) 100-33 UNT-MCG/ML SOPN Inject 25 Units into the skin daily. Patient taking differently: Inject 30 Units into the skin daily.  07/14/19   Minette Brine, FNP  Multiple Vitamin (MULTIVITAMIN PO) Take 1 tablet by mouth daily at 12 noon.     [provider]  Omega-3 Fatty Acids (OMEGA-3 PLUS PO) Take 2 capsules by mouth daily at 12 noon.    [provider]     Vital Signs: BP (!) 126/54   Pulse 66   Temp 98.5 F (36.9 C) (Axillary)   Resp 20   Ht 5' 1"  (1.549 m)   Wt 195 lb 8.8 oz (88.7 kg)   SpO2 100%   BMI 36.95 kg/m   Physical Exam Vitals and nursing note reviewed.  Constitutional:      General:  She is not in acute distress.    Comments: Tracheostomy. Sedated.   Pulmonary:     Effort: Pulmonary effort is normal. No respiratory distress.     Comments: Tracheostomy. Sedated.  Skin:    General: Skin is warm and dry.     Comments: Bilateral upper extremities with anasarca. Right groin puncture site soft without active bleeding or hematoma. Right radial puncture site soft without  active bleeding or hematoma.   Neurological:     Comments: Tracheostomy. Sedated. She opens eyes to voice and minimally follows simple commands. More alert today. PERRL bilaterally. Follows commands with right side (wiggles right toes, minimal right hand grip), demonstrates left-sided neglect. Distal pulses (DPs) 1+ bilaterally and (radial) 2+ bilaterally.      Imaging: DG CHEST PORT 1 VIEW  Result Date: 09/22/2019 CLINICAL DATA:  PICC placement. EXAM: PORTABLE CHEST 1 VIEW COMPARISON:  September 16, 2019. FINDINGS: Stable cardiomediastinal silhouette. Tracheostomy and feeding tubes are unchanged in position. No pneumothorax or pleural effusion is noted. Stable bilateral lung opacities are noted concerning for possible inflammation. Right-sided PICC line is again noted with distal tip in expected position of the SVC. Bony thorax is unremarkable. IMPRESSION: Stable support apparatus. Stable bilateral lung opacities are noted concerning for possible inflammation. No pneumothorax is noted. Aortic Atherosclerosis (ICD10-I70.0). Electronically Signed   By: Marijo Conception M.D.   On: 09/22/2019 12:49    Labs:  CBC: Recent Labs    09/15/19 1211 09/17/19 0703 09/18/19 0923 09/20/19 0500  WBC 14.9* 17.5* 15.3* 10.4  HGB 4.9* 5.6* 4.7* 4.7*  HCT 17.5* 19.0* 16.4* 17.5*  PLT 524* 448* 435* 447*    COAGS: Recent Labs    11/27/18 0728 08/18/19 0705 08/26/19 1715 08/27/19 0230  INR 0.9 0.9 1.1 1.2    BMP: Recent Labs    09/17/19 0703 09/18/19 0923 09/20/19 0500 09/21/19 0500  NA 142 142 144 142  K 4.0 4.4 3.1* 3.3*  CL 103 103 104 103  CO2 26 27 28 30   GLUCOSE 95 151* 181* 145*  BUN 38* 42* 47* 41*  CALCIUM 8.7* 8.7* 8.5* 8.6*  CREATININE 0.84 1.01* 0.97 0.81  GFRNONAA >60 57* >60 >60  GFRAA >60 >60 >60 >60    LIVER FUNCTION TESTS: Recent Labs    09/15/19 1211 09/17/19 0703 09/20/19 0500 09/21/19 0500  BILITOT 0.8 1.1 0.7 0.3  AST 20 32 27 31  ALT 26 23 29  36   ALKPHOS 72 82 95 81  PROT 6.7 6.6 7.4 7.1  ALBUMIN 1.7* 1.6* 1.5* 1.6*    Assessment and Plan:  Right ICA cervical/petrous junction stenosis s/p revascularization using balloon angioplasty via right radial and right femoral approach6/14/2021by Dr. Estanislado Pandy. Mild-moderate right thigh (with some retroperitoneal component) hematoma. Acute blood loss anemia, Jehovah's Witness. Patient's condition stable-tracheostomy withsedation,opens eyes to voice andminimallyfollows simple commands, can wiggle right toes and minimal right hand grip to command, demonstrates left-sided neglect. Right groinand right radial puncture sitesstable, no signs of active bleeding. Hgb 4.7 on 09/20/2019-receiving supplementation per severe anemia protocol. Continue to hold Brilinta/Aspirin/Lovenox.Agree with CCM plans toavoid excess blood drawl. Further plans per CCM/neurology/palliative care- appreciate and agree with management. NIR to follow.   Electronically Signed: Earley Abide, PA-C 09/23/2019, 9:13 AM   I spent a total of 25 Minutes at the the patient's bedside AND on the patient's hospital floor or unit, greater than 50% of which was counseling/coordinating care for right ICA stenosis s/p revascularization.

## 2019-09-23 NOTE — Plan of Care (Signed)
°  Problem: Nutrition: Goal: Adequate nutrition will be maintained Outcome: Not Applicable     Patient noted with 900cc output from rectal tube. Remains afebrile. Continuous tube feeds as ordered.  Dr. Marchelle Gearing made aware of output, change tube feed rate to 10cc/hr until tomorrow 0800 and reassess.

## 2019-09-23 NOTE — Progress Notes (Signed)
NAME:  Donna [name], MRN:  [id number], DOB:  04/03/1950, LOS: 29 ADMISSION DATE:  08/25/2019, CONSULTATION DATE:  08/27/2019 REFERRING MD:  Dr. 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  Admitted by Neuro IR on 6/14 for cerebral angiogram s/p RT ICA cervical/ petrous junction balloon angioplasty for severe stenosis via right femoral approach.  Was placed on ASA/ brillinta.  Monitored in Neuro ICU.  Noted to have developed hypotension, increased left sided weakness and lethargy on 6/15 am with Hgb drop from 13.3 to 6.9 with new AKI.  Post MRI/ MRA showed scattered small acute infarcts along the right MCA/ watershed territory suspected to be resultant either post procedure vs hypoperfusion.    A CT abd/ pelvis was obtained given concern for retroperitoneal bleed which showed a mild to moderate amount of retroperitoneal hematoma in the right iliac and inguinal regiones with moderate size hematoma noted the the soft tissues anterior to the musculature of the right hip; no definite intrapertioneal hemorrhage. INR, PT, and platelets rechecked and were wnl.  Vascular surgery was consulted with plans to observe given bleeding was mostly into the muscle and tissue.  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   7/4 - TRACHEOSTOMY  7/11 - pall  care meeting - full code. Patient feels less congested, ventilatory setting was adjusted to see if she can be liberated from ventilator, avoiding tissue hypoxemia    7/12 - afebrile since 7/8. + 22L since admit. RN feels patient volume overloaded.Does SBT dailly of varying duration few to several hours and then gets fatigued with tachypnea and tachycardia and agitation. Left sided dense hemiplegia continues. Periodically pruposeful on right side PRecedex continues but unsure if patient needs it/ Diarrhea + - on docusate  Consults:  NIR - primary Neurology (stroke has signed off 6/24) Vascular surgery  PCCM Palliative Care Hematology  Procedures:    ETT 6/19 > 6/23,  6/23 > Trach 7/4  Significant Diagnostic Tests:   6/14 cerebral angiogram s/p RT ICA cer/petrous junction balloon angioplasty for severe stenosis.   6/15 MRA/ MRI brain  > Scattered small acute infarcts along the right MCA/watershed territory. Solitary small acute infarct in the left parietal cortex and right cerebellum. Improved right ICA patency at the petrous segment. Known severe right M1 segment stenosis. 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  6/15 CT a/p wo contrast > Mild to moderate amount of retroperitoneal hematoma is noted in the right iliac and inguinal regions. Moderate size hematoma is noted in the soft tissues anterior to the musculature of the right hip. No definite intraperitoneal hemorrhage is noted. Moderate size fat containing periumbilical hernia. Aortic Atherosclerosis.   Echo 6/16 > normal LVEF  6/29 MRI brain > Progressed size and confluence of ischemia throughout much of the right hemisphere white matter since 08/26/2019. Associated petechial blood products but no malignant hemorrhagic transformation or mass Effect. There are also multiple  new lacunar type infarcts elsewhere, including the brainstem and the left basal ganglia. Underlying chronic infarcts of the right basal ganglia  and left Cerebellum.    Micro Data:  6/10 SARS 2 >> neg 6/14 MRSA PCR >> neg 6/20 trach asp >> few diptheroids/ corynebacterium species 6/18 BC x 2 >> neg 7/6 Morganella  Antimicrobials:  6/14 cefazolin pre-op  6/20 unasyn >> 09/02/2019 7/6 vancomycin >> 7/8 7/6 Zosyn>> 7/12 (Morganella)  Interim history/subjective:   7/13 - On PSV via trach. Still unable to come off precedex gtt. ? Diarrhea btter after stopping docusate. No labs as part of blood conservation. Got lasix yesterday. Last dose zosyn today. Diarrhea + but improved after dc laxative. Needed prn for high BP last night  Objective   Blood pressure (!) 159/79, pulse 77, temperature 98.5 F (36.9 C), temperature source Axillary, resp. rate 18, height 5\' 1"  (1.549 m), weight 88.7 kg, SpO2 100 %.    Vent Mode: CPAP;PSV FiO2 (%):  [30 %] 30 % PEEP:  [5 cmH20] 5 cmH20 Pressure Support:  [8 cmH20] 8 cmH20 Plateau Pressure:  [15 cmH20] 15 cmH20   Intake/Output Summary (Last 24 hours) at 09/23/2019 0848 Last data filed at 09/23/2019 0800 Gross per 24 hour  Intake 1367.02 ml  Output 3070 ml  Net -1702.98 ml   Filed Weights   09/21/19 0500 09/22/19 0500 09/23/19 0700  Weight: 94.8 kg 95.3 kg 88.7 kg    Examination:     General Appearance:  Looks criticall ill OBESE - + Head:  Normocephalic, without obvious abnormality, atraumatic Eyes:  PERRL - yes, conjunctiva/corneas - muddy     Ears:  Normal external ear canals, both ears Nose:  G tube - no Throat:  ETT TUBE - no , OG tube - no. TRACH + Neck:  Supple,  No enlargement/tenderness/nodules Lungs: Clear to auscultation bilaterally, Ventilator   Synchrony - yes on PSV/CPAP Heart:  S1 and S2 normal, no murmur, CVP - no.  Pressors - no Abdomen:  Soft, no masses, no organomegaly Genitalia / Rectal:  Not done Extremities:  Extremities- intact Skin:  ntact in exposed areas . Sacral area - not examined Neurologic:  Sedation - precerde gtt -> RASS - sleeping .       Resolved Hospital Problem list   Hypotension  AKI Sepsis  Assessment & Plan:   Right MCA CVA due to bilateral intracranial stenoses  -s/p RT ICA cer/petrous junction balloon angioplasty for severe stenosis c/b right thigh and retroperitoneal hematoma.   7/13 - Persistent left-sided weakness since admit. Currently sleeping   P: PT/OT as able  Nutrition and bowel regiment  Seizure precautions  Continue secondary stroke prevention  Avoid anticoagulation or antiplatelets given high risk for bleed Continue atorvastatin   Acute encephalopathy  7/13  -get s agitated occasionally. On precedex gtt and daily seroquel with prn fent/oxycodeone. Not able to get her off precedex  Plan - wean off precedex gtt when and iff possible   - cotninue seroquel but increase dose to 100mg  QHS to help come off precedex gtt  - fent prn  -oxycodone prn  VAP - Morganella   7/12 - Afebrile since 09/18/19. Slowly improving P: Continue Zosyn to complete 7 days therapy; last dose 09/23/19 today  Acute on chronic respiratory failure with hypoxia requiring mechanical ventilation.  -Failed trial of extubation due to upper airway stridor.  Cords visibly scarred on reintubation. S/P trach 7/4  09/23/2019 - variable abilioty to do SBT - few to several  hours. Stroke, VAP and volume overload all weighing in. Wheezing improved after lasix 7/12 x 1. Didd not respond to prednisone x 5 days  P: SBT as tolerated; PRVC otherwise Diurese (volume overload likely reason for wheeze) x again  Volume Overload with hypertension  7/12 - 22L since positive since admit -. Free water stopped and lasix x 1 given -> 7/13 - 21 L positive since admit  Plan  - hold free water  - lasix x 1 repeat 7/13 -  Daily decision on lasix - PRN bp meds   Acute blood loss anemia  -> Anemia of critical illness : - Jehovah's Witness, no blood products including albumin.    7/12 -  No evidence of active bleeding. Most recent   hgb 4-5gm%  P: Continue severe anemia protocol: Iron, Folic acid, vitamin B12,  Will investigate ? Status of Epogen and Vit C that was previously given Limit blood draws to every 4-7 days as needed Expect slow recovery  Diarrhea  7/13 -  Improved after dc of laxatives one day prior  Plan  - monitor off laxatives   Uncontrolled diabetes with hyperglycemia   P: ssi  Daily Goals Checklist  Pain/Anxiety/Delirium protocol (if indicated): Wean Precedex  VAP protocol (if indicated): Bundle in place  Respiratory support goals: Daily SBT  Blood pressure target: Keep systolic blood pressure less than 160  DVT prophylaxis: Mechanical DVT prophylaxis only given anemia and high bleeding risk.  Nutrition Status: Tolerating tube feeds.  Continue tube feeds. Likely needs PEG (  Per SLP 7/12- not ready for PMV and swallow.)  GI prophylaxis: Pepcid  Urinary catheter: External catheter  Central lines: PICC line  Glucose control: ssi  Mobility/therapy needs: PT OT   Daily labs NO  Code Status: Full code.  Family Communication: Son Evany Schecter updated -> he says he has agreed on LTAC. Says patients sister has already visited [facility] but is going to check on [facility] 09/23/19 -> will decide next 24h   Disposition: ICU. (needs LTAC - case manager working on this with family)     ATTESTATION & SIGNATURE   The patient Donna [name] is critically ill with multiple organ systems failure and requires high complexity decision making for assessment and support, frequent evaluation and titration of therapies, application of advanced monitoring technologies and extensive interpretation of multiple databases.   Critical Care Time devoted to patient care services described in this note is  31  Minutes. This time reflects time of care of this signee Dr Kalman Shan. This critical care time does not reflect procedure time, or teaching time or supervisory time of PA/NP/Med student/Med  Resident etc but could involve care discussion time     Dr. Kalman Shan, M.D., [facility].C.P Pulmonary and Critical Care Medicine Staff Physician [facility] Pager: [phone], If no answer or between  15:00h - 7:00h: call [phone]  09/23/2019 9:07 AM    LABS    PULMONARY No results for input(s): PHART, PCO2ART, PO2ART, HCO3, TCO2, O2SAT in the last 168 hours.  Invalid input(s): PCO2, PO2  CBC Recent Labs  Lab 09/17/19 0703 09/18/19 0923 09/20/19 0500  HGB 5.6* 4.7* 4.7*  HCT 19.0* 16.4* 17.5*  WBC 17.5* 15.3* 10.4  PLT 448* 435* 447*    COAGULATION No results for input(s): INR in the last 168 hours.  CARDIAC  No results for input(s): TROPONINI in the last 168 hours. No results for input(s):  PROBNP in the last 168 hours.   CHEMISTRY Recent Labs  Lab 09/17/19 0703 09/17/19 0703 09/18/19 0923 09/18/19 0923 09/20/19 0500 09/21/19 0500  NA 142  --  142  --  144 142  K 4.0   < > 4.4   < > 3.1* 3.3*  CL 103  --  103  --  104 103  CO2 26  --  27  --  28 30  GLUCOSE 95  --  151*  --  181* 145*  BUN 38*  --  42*  --  47* 41*  CREATININE 0.84  --  1.01*  --  0.97 0.81  CALCIUM 8.7*  --  8.7*  --  8.5* 8.6*  MG  --   --  2.7*  --   --   --   PHOS  --   --  3.9  --   --   --    < > = values in this interval not displayed.   Estimated Creatinine Clearance: 67.4 mL/min (by C-G formula based on SCr of 0.81 mg/dL).   LIVER Recent Labs  Lab 09/17/19 0703 09/20/19 0500 09/21/19 0500  AST 32 27 31  ALT 23 29 36  ALKPHOS 82 95 81  BILITOT 1.1 0.7 0.3  PROT 6.6 7.4 7.1  ALBUMIN 1.6* 1.5* 1.6*     INFECTIOUS No results for input(s): LATICACIDVEN, PROCALCITON in the last 168 hours.   ENDOCRINE CBG (last 3)  Recent Labs    09/22/19 2303 09/23/19 0319 09/23/19 0716  GLUCAP 122* 131* 108*         IMAGING x48h  - image(s) personally visualized  -   highlighted in bold DG CHEST PORT 1  VIEW  Result Date: 09/22/2019 CLINICAL DATA:  PICC placement. EXAM: PORTABLE CHEST 1 VIEW COMPARISON:  September 16, 2019. FINDINGS: Stable cardiomediastinal silhouette. Tracheostomy and feeding tubes are unchanged in position. No pneumothorax or pleural effusion is noted. Stable bilateral lung opacities are noted concerning for possible inflammation. Right-sided PICC line is again noted with distal tip in expected position of the SVC. Bony thorax is unremarkable. IMPRESSION: Stable support apparatus. Stable bilateral lung opacities are noted concerning for possible inflammation. No pneumothorax is noted. Aortic Atherosclerosis (ICD10-I70.0). Electronically Signed   By: Lupita RaiderJames  Green Jr M.D.   On: 09/22/2019 12:49

## 2019-09-23 NOTE — Progress Notes (Signed)
eLink Physician-Brief Progress Note Patient Name: Donna [name] DOB: Jul 12, 1950 MRN: [id number]   Date of Service  09/23/2019  HPI/Events of Note  Hypoglycemia - Blood glucose = 64. Tube feeds at 10 mL/hour. On Lantus + Novolog SSI and TF Novolog coverage.   eICU Interventions  Plan: 1. D10W to run IV at 30 mL/hour.         Abner Ardis Dennard Nip 09/23/2019, 11:45 PM

## 2019-09-24 LAB — GLUCOSE, CAPILLARY
Glucose-Capillary: 100 mg/dL — ABNORMAL HIGH (ref 70–99)
Glucose-Capillary: 116 mg/dL — ABNORMAL HIGH (ref 70–99)
Glucose-Capillary: 138 mg/dL — ABNORMAL HIGH (ref 70–99)
Glucose-Capillary: 159 mg/dL — ABNORMAL HIGH (ref 70–99)
Glucose-Capillary: 73 mg/dL (ref 70–99)
Glucose-Capillary: 91 mg/dL (ref 70–99)
Glucose-Capillary: 98 mg/dL (ref 70–99)

## 2019-09-24 MED ORDER — INSULIN GLARGINE 100 UNIT/ML ~~LOC~~ SOLN
16.0000 [IU] | Freq: Every day | SUBCUTANEOUS | Status: DC
  Administered 2019-09-24 – 2019-10-03 (×10): 16 [IU] via SUBCUTANEOUS
  Filled 2019-09-24 (×10): qty 0.16

## 2019-09-24 MED ORDER — POTASSIUM CHLORIDE 20 MEQ/15ML (10%) PO SOLN
40.0000 meq | Freq: Once | ORAL | Status: AC
  Administered 2019-09-24: 40 meq
  Filled 2019-09-24: qty 30

## 2019-09-24 MED ORDER — FUROSEMIDE 10 MG/ML IJ SOLN
40.0000 mg | Freq: Once | INTRAMUSCULAR | Status: AC
  Administered 2019-09-24: 40 mg via INTRAVENOUS
  Filled 2019-09-24: qty 4

## 2019-09-24 MED ORDER — VITAL AF 1.2 CAL PO LIQD
1000.0000 mL | ORAL | Status: AC
  Administered 2019-09-24: 1000 mL

## 2019-09-24 NOTE — Evaluation (Addendum)
Passy-Muir Speaking Valve - Evaluation Patient Details  Name: Donna [name] MRN: [id number] Date of Birth: [birth year]  Today's Date: 09/24/2019 Time: 5170-0174 SLP Time Calculation (min) (ACUTE ONLY): 28 min  Past Medical History:  Past Medical History:  Diagnosis Date  . Anemia    low iron - not since having fibroids removed  . Arthritis   . Hyperlipidemia   . Hypertension   . Sleep apnea    just recently diagnosed with it, has not gotten the Cpap (done 10/29/18)  . Stroke [facility])    weakness on left side  . Type II diabetes mellitus (HCC)    Past Surgical History:  Past Surgical History:  Procedure Laterality Date  . COLONOSCOPY    . IR ANGIO INTRA EXTRACRAN SEL COM CAROTID INNOMINATE BILAT MOD SED  10/18/2018  . IR ANGIO INTRA EXTRACRAN SEL COM CAROTID INNOMINATE BILAT MOD SED  08/18/2019  . IR ANGIO VERTEBRAL SEL SUBCLAVIAN INNOMINATE UNI R MOD SED  08/18/2019  . IR ANGIO VERTEBRAL SEL VERTEBRAL BILAT MOD SED  10/18/2018  . IR ANGIO VERTEBRAL SEL VERTEBRAL UNI L MOD SED  08/18/2019  . IR PTA INTRACRANIAL  08/25/2019  . IR US GUIDE VASC ACCESS RIGHT  10/18/2018  . IR US GUIDE VASC ACCESS RIGHT  08/18/2019  . IR US GUIDE VASC ACCESS RIGHT  08/25/2019  . MYOMECTOMY    . RADIOLOGY WITH ANESTHESIA N/A 08/18/2019   Procedure: STENTING;  Surgeon: Julieanne Cotton, MD;  Location: [facility] OR;  Service: Radiology;  Laterality: N/A;  . RADIOLOGY WITH ANESTHESIA N/A 08/25/2019   Procedure: STENTING;  Surgeon: Julieanne Cotton, MD;  Location: [facility] OR;  Service: Radiology;  Laterality: N/A;   HPI:  69 y.o. female with PMH significant for HTN, HLD, CVA 06/2018, DM, anemia, obesity, OSA, and OA. Pt underwent R ICA angioplasty on 6/14. Pt with decline in neuro status and hemoglobin on 6/15, found to have a large R groin hematoma.  Pt has had Hbg 5.0 or less and is a Jehovah's witness. MRI showed scattered small acute infarcts along the right MCA/watershed territory. Solitary small acute infarct in the left  parietal cortex and right cerebellum. She was intubated on 6/19. Pt underwent tracheostomy on 7/3. PT found to be septic with RLL PNA on 7/6.    Assessment / Plan / Recommendation Clinical Impression  Pt was seen with OT to maximize mentation and positioning for PMV safety. She has audible congestion, but only expectorated a small amount of thicker appearing secretions tracheally x1. Cuff was deflated for approximately 20 minutes but PMV could only be donned for ~60 seconds at a time. She had no evidence of back pressure and no overt changes in VS, but she consistently developed increased WOB with valve placement. She does have some upper airway patency, as she can produce short bursts of phonation while valve is donned. Phonation is low, hoarse, and rough in quality. Recommend PMV use with SLP only with hopeful progression to longer trials perhaps as secretions improve. She would also benefit from cuffless trach if she continues to stay off the vent.   SLP Visit Diagnosis: Aphonia (R49.1)    SLP Assessment  Patient needs continued Speech Lanaguage Pathology Services    Follow Up Recommendations  Skilled Nursing facility    Frequency and Duration min 2x/week  2 weeks    PMSV Trial PMSV was placed for: max of ~60 seconds Able to redirect subglottic air through upper airway: Yes Able to Attain Phonation: Yes Voice Quality:  Hoarse;Low vocal intensity;Other (comment) (rough) Able to Expectorate Secretions: Yes Level of Secretion Expectoration with PMSV: Tracheal Breath Support for Phonation: Moderately decreased Intelligibility: Intelligibility reduced Word: 25-49% accurate Respirations During Trial:  (peak in the mid-30s) SpO2 During Trial:  (WNL) Pulse During Trial:  (WNL) Behavior: Alert   Tracheostomy Tube       Vent Dependency       Cuff Deflation Trial  GO Tolerated Cuff Deflation: Yes Length of Time for Cuff Deflation Trial: ~20 min Behavior: Alert        Mahala Menghini.,  M.A. CCC-SLP Acute Rehabilitation Services Pager [phone] Office [phone]  09/24/2019, 3:51 PM

## 2019-09-24 NOTE — Progress Notes (Signed)
NAME:  Donna [name], MRN:  [id number], DOB:  05/09/1950, LOS: 30 ADMISSION DATE:  08/25/2019, CONSULTATION DATE:  08/27/2019 REFERRING MD:  Dr. 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  Admitted by Neuro IR on 6/14 for cerebral angiogram s/p RT ICA cervical/ petrous junction balloon angioplasty for severe stenosis via right femoral approach.  Was placed on ASA/ brillinta.  Monitored in Neuro ICU.  Noted to have developed hypotension, increased left sided weakness and lethargy on 6/15 am with Hgb drop from 13.3 to 6.9 with new AKI.  Post MRI/ MRA showed scattered small acute infarcts along the right MCA/ watershed territory suspected to be resultant either post procedure vs hypoperfusion.    A CT abd/ pelvis was obtained given concern for retroperitoneal bleed which showed a mild to moderate amount of retroperitoneal hematoma in the right iliac and inguinal regiones with moderate size hematoma noted the the soft tissues anterior to the musculature of the right hip; no definite intrapertioneal hemorrhage. INR, PT, and platelets rechecked and were wnl.  Vascular surgery was consulted with plans to observe given bleeding was mostly into the muscle and tissue.  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   7/4 - TRACHEOSTOMY  7/11 - pall  care meeting - full code. Patient feels less congested, ventilatory setting was adjusted to see if she can be liberated from ventilator, avoiding tissue hypoxemia    7/12 - afebrile since 7/8. + 22L since admit. RN feels patient volume overloaded.Does SBT dailly of varying duration few to several hours and then gets fatigued with tachypnea and tachycardia and agitation. Left sided dense hemiplegia continues. Periodically pruposeful on right side PRecedex continues but unsure if patient needs it/ Diarrhea + - on docusate   7/13 - On PSV via trach. Still unable to come off precedex gtt. ? Diarrhea btter after stopping docusate. No labs as part of blood conservation. Got lasix yesterday. Last dose zosyn today. Diarrhea + but improved after dc laxative. Needed prn for high BP last night. Trach sutures removed  Consults:  NIR - primary Neurology (stroke has signed off 6/24) Vascular surgery  PCCM Palliative Care Hematology  Procedures:    ETT 6/19 > 6/23,  6/23 > Trach 7/4  Significant Diagnostic Tests:   6/14 cerebral angiogram s/p RT ICA cer/petrous junction balloon angioplasty for severe stenosis.   6/15 MRA/ MRI brain  > Scattered small acute infarcts along the right MCA/watershed territory. Solitary small acute infarct in the left parietal cortex and right cerebellum. Improved right ICA patency at the petrous segment. Known severe right M1 segment stenosis. 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  6/15 CT a/p wo contrast > Mild to moderate amount of retroperitoneal hematoma is noted in the right iliac and inguinal regions. Moderate size hematoma is noted in the soft tissues anterior to the musculature of the right hip. No definite intraperitoneal hemorrhage is noted.  Moderate size fat containing periumbilical hernia. Aortic Atherosclerosis.   Echo 6/16 > normal LVEF  6/29 MRI brain > Progressed size and confluence of ischemia throughout much of the right hemisphere white  matter since 08/26/2019. Associated petechial blood products but no malignant hemorrhagic transformation or mass Effect. There are also multiple new lacunar type infarcts elsewhere, including the brainstem and the left basal ganglia. Underlying chronic infarcts of the right basal ganglia and left Cerebellum.    Micro Data:  6/10 SARS 2 >> neg 6/14 MRSA PCR >> neg 6/20 trach asp >> few diptheroids/ corynebacterium species 6/18 BC x 2 >> neg 7/6 Morganella  Antimicrobials:  6/14 cefazolin pre-op  6/20 unasyn >> 09/02/2019 7/6 vancomycin >> 7/8 7/6 Zosyn>> 7/12 (Morganella)  Interim history/subjective:   7/13 - Off precedex since last night. Awake on ATC. Following commands on right slowly. Dense hemiplegia on left. Volume:  down to +17.3 L positive/ TF reduced from 60 -> 30cc yesterday due to ddiarrha and then got hypoglycemic. D10w started - > diarrhea better.   Objective   Blood pressure (!) 128/49, pulse 67, temperature 98.7 F (37.1 C), temperature source Axillary, resp. rate 15, height 5\' 1"  (1.549 m), weight 88.7 kg, SpO2 100 %.    Vent Mode: Stand-by FiO2 (%):  [28 %-35 %] 28 %   Intake/Output Summary (Last 24 hours) at 09/24/2019 0858 Last data filed at 09/24/2019 0700 Gross per 24 hour  Intake 905.14 ml  Output 4550 ml  Net -3644.86 ml   Filed Weights   09/21/19 0500 09/22/19 0500 09/23/19 0700  Weight: 94.8 kg 95.3 kg 88.7 kg    Examination:     General Appearance:  Looks criticall ill OBESE - +. But looking better Head:  Normocephalic, without obvious abnormality, atraumatic Eyes:  PERRL - yes, conjunctiva/corneas - muddy     Ears:  Normal external ear canals, both ears Nose:  G tube - no Throat:  ETT TUBE - no , OG tube - no. TRACH +. On ATC Neck:  Supple,  No enlargement/tenderness/nodules Lungs: Clear to auscultation bilaterally, Ventilator   Synchrony - yes on  ATC Heart:  S1 and S2 normal, no murmur, CVP - no.  Pressors - no Abdomen:  Soft, no masses,  no organomegaly Genitalia / Rectal:  Not done Extremities:  Extremities- intact Skin:  ntact in exposed areas . Sacral area - not examined Neurologic:  Off precedex gtt. On ATC. Moves right to command. Dense hemiplegia on left    Resolved Hospital Problem list   Hypotension  AKI Sepsis VAP - Morganella s/p zosyn ending 09/23/19  Assessment & Plan:   Right MCA CVA due to bilateral intracranial stenoses  -s/p RT ICA cer/petrous junction balloon angioplasty for severe stenosis c/b right thigh and retroperitoneal hematoma.   7/14 - more awake. Off precedex. Dense hemiplegia on left. Follows command on right   P: PT/OT as able  Nutrition and bowel regiment  Seizure precautions  Continue secondary stroke prevention  Avoid anticoagulation or antiplatelets given high risk for bleed Continue atorvastatin   Acute encephalopathy  7/14  - improved and off precedex after seroquel increase last night and continued diuresis. Also on prn fent/oxycodeone  Plan - cotninue seroquel at 100mg  QHS (has helped come off precedex) - check 12 lead EKG 09/25/19 for QTc  - fent prn  -oxycodone prn    Acute on chronic respiratory failure with hypoxia requiring mechanical ventilation.  -Failed trial of extubation due to upper airway stridor.  Cords visibly scarred on reintubation. S/P trach 7/4  09/24/2019 - now on ATC and able to do more ATC and SBT with coming off precedex and diuresis and VAP Rx.   P: SBT as tolerated; PRVC otherwise Diurese (volume overload likely reason for wheeze) x again  Volume Overload with hypertension  7/12 - 22L since positive since admit -. Free water stopped and lasix x 1 given -> 7/13 - 21 L positive since admit -> 7/14 : +17L  Plan  - hold free water  - lasix x 1 repeat 7/14 -  Daily decision on lasix - PRN bp meds   Acute blood loss anemia  -> Anemia of critical illness : - Jehovah's Witness, no blood products including albumin.    7/14: Most recent   hgb 4-5gm% on 7/10. No active bleeding currently  P: Continue severe anemia protocol: Iron, Folic acid, vitamin B12,  Monitor off  Epogen and Vit C that was previously given Limit blood draws to every 4-7 days as needed - aim next 09/26/19 Expect slow recovery  Diarrhea  7/14 - persisted 7/13 despite dc laxative 7/12 and then improved only aftrer reducing TF. No abd tenderness. Getting hypoglucemic  Plan  - increase TF to 30cc/h - nutn to review change of TF from vital to something else  Uncontrolled diabetes with hyperglycemia  7/14 - hypoglycemic and on d10 when TF reduced  P: ssi - halve glargine from 32U to 16U  Daily Goals Checklist  Pain/Anxiety/Delirium protocol (if indicated): prn agents   VAP protocol (if indicated): Bundle in place  Respiratory support goals: Daily SBT  Blood pressure target: Keep systolic blood pressure less than 160  DVT prophylaxis: Mechanical DVT prophylaxis only given anemia and high bleeding risk.  Nutrition Status: Tolerating tube feeds.  Continue tube feeds but incrase from 10 to 30cc due to hypoglucemia. RD to look for change from Vital due to diarrhea. . Likely needs PEG (  Per SLP 7/12- not ready for PMV and swallow.)  GI prophylaxis: Pepcid  Urinary catheter: External catheter  Central lines: PICC line  Glucose control: ssi  Mobility/therapy needs: PT OT   Daily labs NO  Code Status: Full code.  Family Communication: Son Edger HouseKinya Hillesheim updated -> he says he has agreed on LTAC. Says patients sister has already visited [facility] but is going to check on [facility] 09/23/19 -> will decide next 24h . Called son 7/14 - went to [facility] That was not set up   Disposition: ICU. (needs LTAC - case manager working on this with family)      SIGNATURE    Dr. Kalman ShanMurali Yashar Inclan, M.D., F.C.C.P,  Pulmonary and Critical Care Medicine Staff Physician, [facility] Center Director - Interstitial Lung Disease  Program  Pulmonary Fibrosis  [facility]Foundation - [facility] at [facility] [place], [place], [id number]  Pager: [phone], If no answer or between  15:00h - 7:00h: call [phone] Telephone: [phone]  9:23 AM 09/24/2019    LABS    PULMONARY No results for input(s): PHART, PCO2ART, PO2ART, HCO3, TCO2, O2SAT in the last 168 hours.  Invalid input(s): PCO2, PO2  CBC Recent Labs  Lab 09/18/19 0923 09/20/19 0500  HGB 4.7* 4.7*  HCT 16.4* 17.5*  WBC 15.3* 10.4  PLT 435* 447*    COAGULATION No results for input(s): INR in the last 168 hours.  CARDIAC  No results for input(s): TROPONINI in the last 168 hours. No results for input(s):  PROBNP in the last 168 hours.   CHEMISTRY Recent Labs  Lab 09/18/19 0923 09/18/19 0923 09/20/19 0500 09/21/19 0500  NA 142  --  144 142  K 4.4   < > 3.1* 3.3*  CL 103  --  104 103  CO2 27  --  28 30  GLUCOSE 151*  --  181* 145*  BUN 42*  --  47* 41*  CREATININE 1.01*  --  0.97 0.81  CALCIUM 8.7*  --  8.5* 8.6*  MG 2.7*  --   --   --   PHOS 3.9  --   --   --    < > = values in this interval not displayed.   Estimated Creatinine Clearance: 67.4 mL/min (by C-G formula based on SCr of 0.81 mg/dL).   LIVER Recent Labs  Lab 09/20/19 0500 09/21/19 0500  AST 27 31  ALT 29 36  ALKPHOS 95 81  BILITOT 0.7 0.3  PROT 7.4 7.1  ALBUMIN 1.5* 1.6*     INFECTIOUS No results for input(s): LATICACIDVEN, PROCALCITON in the last 168 hours.   ENDOCRINE CBG (last 3)  Recent Labs    09/24/19 0059 09/24/19 0314 09/24/19 0714  GLUCAP 98 100* 116*         IMAGING x48h  - image(s) personally visualized  -   highlighted in bold DG CHEST PORT 1 VIEW  Result Date: 09/22/2019 CLINICAL DATA:  PICC placement. EXAM: PORTABLE CHEST 1 VIEW COMPARISON:  September 16, 2019. FINDINGS: Stable cardiomediastinal silhouette. Tracheostomy and feeding tubes are unchanged in position. No pneumothorax or pleural effusion is noted. Stable bilateral lung opacities are noted  concerning for possible inflammation. Right-sided PICC line is again noted with distal tip in expected position of the SVC. Bony thorax is unremarkable. IMPRESSION: Stable support apparatus. Stable bilateral lung opacities are noted concerning for possible inflammation. No pneumothorax is noted. Aortic Atherosclerosis (ICD10-I70.0). Electronically Signed   By: Lupita Raider M.D.   On: 09/22/2019 12:49

## 2019-09-24 NOTE — Progress Notes (Signed)
Referring Physician(s): Melvenia Beam  Supervising Physician: Luanne Bras  Patient Status:  [facility] - In-pt  Chief Complaint: None-tracheostomy without sedation  Subjective:  Right ICA cervical/petrous junction stenosis s/p revascularization using balloon angioplasty via right radial and right femoral approach6/14/2021by Dr. Estanislado Pandy. Mild-moderate right thigh (with some retroperitoneal component) hematoma. Acute blood loss anemia, Jehovah's Witness. Patient awake laying in bed, tracheostomy in place, no sedation.Shefollows simple commands. More alert today. Follows commands with right side, demonstratesleft-sided neglect. Right groin and right radial puncture sites c/d/i. Most recenthgbwas4.7 on 09/20/2019. No family at bedside this AM.   Allergies: Patient has no known allergies.  Medications: Prior to Admission medications   Medication Sig Start Date End Date Taking? Authorizing Provider  amLODipine (NORVASC) 10 MG tablet Take 1 tablet (10 mg total) by mouth daily. 05/12/19  Yes Minette Brine, FNP  aspirin EC 81 MG tablet Take 81 mg by mouth every evening.   Yes [provider]  Calcium Carbonate-Vitamin D (CALCIUM-D PO) Take 2 tablets by mouth daily at 12 noon.    Yes [provider]  carvedilol (COREG) 6.25 MG tablet TAKE 1 TABLET(6.25 MG) BY MOUTH TWICE DAILY Patient taking differently: Take 6.25 mg by mouth 2 (two) times daily with a meal.  07/31/19  Yes Minette Brine, FNP  hydrALAZINE (APRESOLINE) 50 MG tablet Take 1 tablet (50 mg total) by mouth 2 (two) times daily. 06/16/19 09/14/19 Yes Elouise Munroe, MD  Insulin Degludec-Liraglutide (XULTOPHY) 100-3.6 UNIT-MG/ML SOPN Inject 30 Units into the skin daily. 08/14/19  Yes Minette Brine, FNP  telmisartan-hydrochlorothiazide (MICARDIS HCT) 40-12.5 MG tablet Take 1 tablet by mouth daily. 07/31/19  Yes Minette Brine, FNP  ticagrelor (BRILINTA) 90 MG TABS tablet Take 1 tablet (90 mg total) by mouth  2 (two) times daily. 06/12/19  Yes Elouise Munroe, MD  vitamin C (ASCORBIC ACID) 250 MG tablet Take 500 mg by mouth daily at 12 noon.   Yes [provider]  blood glucose meter kit and supplies KIT Dispense based on patient and insurance preference. Use up to four times daily as directed. (FOR ICD-9 250.00, 250.01). 05/10/18   Minette Brine, FNP  Blood Glucose Monitoring Suppl (TRUE METRIX METER) w/Device KIT 1 each by Does not apply route in the morning, at noon, in the evening, and at bedtime. 05/22/19   Minette Brine, FNP  Evolocumab (REPATHA SURECLICK) 546 MG/ML SOAJ Inject 140 mg into the skin every 14 (fourteen) days. 06/30/19   Elouise Munroe, MD  glucose blood (TRUE METRIX BLOOD GLUCOSE TEST) test strip Check blood sugar 4 times a day before meals and bedtime 05/22/19   Minette Brine, FNP  Insulin Glargine-Lixisenatide (SOLIQUA) 100-33 UNT-MCG/ML SOPN Inject 25 Units into the skin daily. Patient taking differently: Inject 30 Units into the skin daily.  07/14/19   Minette Brine, FNP  Multiple Vitamin (MULTIVITAMIN PO) Take 1 tablet by mouth daily at 12 noon.     [provider]  Omega-3 Fatty Acids (OMEGA-3 PLUS PO) Take 2 capsules by mouth daily at 12 noon.    [provider]     Vital Signs: BP (!) 128/49   Pulse 67   Temp 98.7 F (37.1 C) (Axillary)   Resp 15   Ht 5' 1"  (1.549 m)   Wt 195 lb 8.8 oz (88.7 kg)   SpO2 100%   BMI 36.95 kg/m   Physical Exam Vitals and nursing note reviewed.  Constitutional:      General: She is not  in acute distress.    Comments: Tracheostomy without sedation.  Pulmonary:     Effort: Pulmonary effort is normal. No respiratory distress.     Comments: Tracheostomy without sedation. Skin:    General: Skin is warm and dry.     Comments: Gross anasarca. Right groin puncture site soft without active bleeding or hematoma. Right radial puncture site soft without active bleeding or hematoma.  Neurological:     Mental  Status: She is alert.     Comments: Tracheostomy without sedation. More alert today, follows simple commands of right side. PERRL bilaterally. Follows commands with right side (wiggles right toes and attempts to bend right knee, grips with right hand and bends right elbow), demonstrates left-sided neglect. Distal pulses (DPs) 1+ bilaterally and (radial) 2+ bilaterally.     Imaging: DG CHEST PORT 1 VIEW  Result Date: 09/22/2019 CLINICAL DATA:  PICC placement. EXAM: PORTABLE CHEST 1 VIEW COMPARISON:  September 16, 2019. FINDINGS: Stable cardiomediastinal silhouette. Tracheostomy and feeding tubes are unchanged in position. No pneumothorax or pleural effusion is noted. Stable bilateral lung opacities are noted concerning for possible inflammation. Right-sided PICC line is again noted with distal tip in expected position of the SVC. Bony thorax is unremarkable. IMPRESSION: Stable support apparatus. Stable bilateral lung opacities are noted concerning for possible inflammation. No pneumothorax is noted. Aortic Atherosclerosis (ICD10-I70.0). Electronically Signed   By: Marijo Conception M.D.   On: 09/22/2019 12:49    Labs:  CBC: Recent Labs    09/15/19 1211 09/17/19 0703 09/18/19 0923 09/20/19 0500  WBC 14.9* 17.5* 15.3* 10.4  HGB 4.9* 5.6* 4.7* 4.7*  HCT 17.5* 19.0* 16.4* 17.5*  PLT 524* 448* 435* 447*    COAGS: Recent Labs    11/27/18 0728 08/18/19 0705 08/26/19 1715 08/27/19 0230  INR 0.9 0.9 1.1 1.2    BMP: Recent Labs    09/17/19 0703 09/18/19 0923 09/20/19 0500 09/21/19 0500  NA 142 142 144 142  K 4.0 4.4 3.1* 3.3*  CL 103 103 104 103  CO2 26 27 28 30   GLUCOSE 95 151* 181* 145*  BUN 38* 42* 47* 41*  CALCIUM 8.7* 8.7* 8.5* 8.6*  CREATININE 0.84 1.01* 0.97 0.81  GFRNONAA >60 57* >60 >60  GFRAA >60 >60 >60 >60    LIVER FUNCTION TESTS: Recent Labs    09/15/19 1211 09/17/19 0703 09/20/19 0500 09/21/19 0500  BILITOT 0.8 1.1 0.7 0.3  AST 20 32 27 31  ALT 26 23 29   36  ALKPHOS 72 82 95 81  PROT 6.7 6.6 7.4 7.1  ALBUMIN 1.7* 1.6* 1.5* 1.6*    Assessment and Plan:  Right ICA cervical/petrous junction stenosis s/p revascularization using balloon angioplasty via right radial and right femoral approach6/14/2021by Dr. Estanislado Pandy. Mild-moderate right thigh (with some retroperitoneal component) hematoma. Acute blood loss anemia, Jehovah's Witness. Patient's condition stable-tracheostomy withoutsedation,more alert today, follows simple commands of right side, demonstrates left-sided neglect. Right groinand right radial puncture sitesstable, no signs of active bleeding. Hgb 4.7on 09/20/2019-receiving supplementation per severe anemia protocol. Continue to hold Brilinta/Aspirin/Lovenox.Agree with CCM plans toavoid excess blood drawl. Further plans per CCM/neurology/palliative care- appreciate and agree with management. NIR to follow.   Electronically Signed: Earley Abide, PA-C 09/24/2019, 8:37 AM   I spent a total of 25 Minutes at the the patient's bedside AND on the patient's hospital floor or unit, greater than 50% of which was counseling/coordinating care for right ICA stenosis s/p revascularization.

## 2019-09-24 NOTE — Evaluation (Signed)
Clinical/Bedside Swallow Evaluation Patient Details  Name: Donna [name] MRN: [id number] Date of Birth: [birth year]  Today's Date: 09/24/2019 Time: SLP Start Time (ACUTE ONLY): 1408 SLP Stop Time (ACUTE ONLY): 1436 SLP Time Calculation (min) (ACUTE ONLY): 28 min  Past Medical History:  Past Medical History:  Diagnosis Date  . Anemia    low iron - not since having fibroids removed  . Arthritis   . Hyperlipidemia   . Hypertension   . Sleep apnea    just recently diagnosed with it, has not gotten the Cpap (done 10/29/18)  . Stroke [facility])    weakness on left side  . Type II diabetes mellitus (HCC)    Past Surgical History:  Past Surgical History:  Procedure Laterality Date  . COLONOSCOPY    . IR ANGIO INTRA EXTRACRAN SEL COM CAROTID INNOMINATE BILAT MOD SED  10/18/2018  . IR ANGIO INTRA EXTRACRAN SEL COM CAROTID INNOMINATE BILAT MOD SED  08/18/2019  . IR ANGIO VERTEBRAL SEL SUBCLAVIAN INNOMINATE UNI R MOD SED  08/18/2019  . IR ANGIO VERTEBRAL SEL VERTEBRAL BILAT MOD SED  10/18/2018  . IR ANGIO VERTEBRAL SEL VERTEBRAL UNI L MOD SED  08/18/2019  . IR PTA INTRACRANIAL  08/25/2019  . IR US GUIDE VASC ACCESS RIGHT  10/18/2018  . IR US GUIDE VASC ACCESS RIGHT  08/18/2019  . IR US GUIDE VASC ACCESS RIGHT  08/25/2019  . MYOMECTOMY    . RADIOLOGY WITH ANESTHESIA N/A 08/18/2019   Procedure: STENTING;  Surgeon: Julieanne Cotton, MD;  Location: [facility] OR;  Service: Radiology;  Laterality: N/A;  . RADIOLOGY WITH ANESTHESIA N/A 08/25/2019   Procedure: STENTING;  Surgeon: Julieanne Cotton, MD;  Location: [facility] OR;  Service: Radiology;  Laterality: N/A;   HPI:  69 y.o. female with PMH significant for HTN, HLD, CVA 06/2018, DM, anemia, obesity, OSA, and OA. Pt underwent R ICA angioplasty on 6/14. Pt with decline in neuro status and hemoglobin on 6/15, found to have a large R groin hematoma.  Pt has had Hbg 5.0 or less and is a Jehovah's witness. MRI showed scattered small acute infarcts along the right MCA/watershed  territory. Solitary small acute infarct in the left parietal cortex and right cerebellum. She was intubated on 6/19. Pt underwent tracheostomy on 7/3. PT found to be septic with RLL PNA on 7/6.    Assessment / Plan / Recommendation Clinical Impression  Pt was seen with OT to maximize mentation and positioning for swallowing safety. There is noted L sided facial weakness and lingual deviation. Ice chips were introduced with and without PMV given limited tolerance. She had improving automaticity for oral preparation as trials continued. Although she does not cough or swallow to command, she consistently swallowed with each bolus. There is delayed coughing that sounds weak and audible wetness post-swallow, but given baseline secretions this makes clinical correlation difficult. She also is at heightened risk for silent aspiration given her neurological deficits mixed with prolonged intubation and dysphonia. Would remain NPO for now with ongoing swallowing tx to determine readiness for further instrumental testing.   SLP Visit Diagnosis: Dysphagia, unspecified (R13.10)    Aspiration Risk  Moderate aspiration risk    Diet Recommendation NPO;Alternative means - temporary   Medication Administration: Via alternative means    Other  Recommendations Oral Care Recommendations: Oral care QID Other Recommendations: Have oral suction available   Follow up Recommendations Skilled Nursing facility      Frequency and Duration min 2x/week  2 weeks  Prognosis Prognosis for Safe Diet Advancement: Good Barriers to Reach Goals: Cognitive deficits      Swallow Study   General HPI: 69 y.o. female with PMH significant for HTN, HLD, CVA 06/2018, DM, anemia, obesity, OSA, and OA. Pt underwent R ICA angioplasty on 6/14. Pt with decline in neuro status and hemoglobin on 6/15, found to have a large R groin hematoma.  Pt has had Hbg 5.0 or less and is a Jehovah's witness. MRI showed scattered small acute  infarcts along the right MCA/watershed territory. Solitary small acute infarct in the left parietal cortex and right cerebellum. She was intubated on 6/19. Pt underwent tracheostomy on 7/3. PT found to be septic with RLL PNA on 7/6.  Type of Study: Bedside Swallow Evaluation Previous Swallow Assessment: none in chart Diet Prior to this Study: NPO;NG Tube Temperature Spikes Noted: No Respiratory Status: Trach;Trach Collar Trach Size and Type: Cuff;#6;Deflated;With PMSV in place;With PMSV not in place History of Recent Intubation: Yes Length of Intubations (days): 14 days Date extubated:  (trach 7/3) Behavior/Cognition: Alert;Requires cueing Oral Care Completed by SLP: Yes Oral Cavity - Dentition: Adequate natural dentition;Missing dentition Self-Feeding Abilities: Needs assist Patient Positioning:  (EOB) Baseline Vocal Quality: Hoarse;Low vocal intensity;Other (comment) (rough) Volitional Cough: Cognitively unable to elicit Volitional Swallow: Able to elicit    Oral/Motor/Sensory Function Overall Oral Motor/Sensory Function: Moderate impairment Facial ROM: Reduced left;Suspected CN VII (facial) dysfunction Facial Symmetry: Abnormal symmetry left;Suspected CN VII (facial) dysfunction Facial Strength: Reduced left;Suspected CN VII (facial) dysfunction Lingual ROM: Reduced left;Suspected CN XII (hypoglossal) dysfunction Lingual Symmetry: Abnormal symmetry left;Suspected CN XII (hypoglossal) dysfunction Lingual Strength: Reduced;Suspected CN XII (hypoglossal) dysfunction   Ice Chips Ice chips: Impaired Presentation: Spoon Oral Phase Impairments: Reduced lingual movement/coordination Pharyngeal Phase Impairments: Wet Vocal Quality;Cough - Delayed   Thin Liquid Thin Liquid: Not tested    Nectar Thick Nectar Thick Liquid: Not tested   Honey Thick Honey Thick Liquid: Not tested   Puree Puree: Not tested   Solid     Solid: Not tested      Mahala Menghini., M.A. CCC-SLP Acute Rehabilitation  Services Pager [phone] Office [phone]  09/24/2019,3:59 PM

## 2019-09-24 NOTE — Progress Notes (Signed)
Occupational Therapy Reevaluation  Patient Details Name: Donna [name] MRN: [id number] DOB: [birth year] Today's Date: 09/24/2019    History of present illness 69 y.o. female with PMH significant for HTN, HLD, CVA 06/2018, DM, anemia, obesity, OSA, and OA. She is known to [facility] and has been followed by Dr. Corliss Skains since 10/2018. Pt underwent R ICA angioplasty on 6/14. Pt with decline in neuro status and hemoglobin on 6/15, found to have a large R groin hematoma.  Pt has had Hbg 5.0 or less and is a Jehovah's witness.  She was intubated on 6/19. Pt underwent tracheostomy on 7/3. PT found to be septic with RLL PNA on 7/6. 7/13 trach tolerating extubation   OT comments  OT reevaluation of patient with SLP this session. Pt tolerated EOB sitting for >10 minutes with R side activation. Pt noted to have wet breath sounds but stable VSS with RR 36 - 17 . SLP also attempting PMS during session. Pt total (A) for static sitting eob without LB support due to inability of bil LE to reach floor. Recommend SNF for d/c planning.   Follow Up Recommendations  SNF    Equipment Recommendations  Wheelchair cushion (measurements OT);Wheelchair (measurements OT);Hospital bed;Other (comment) (lift )    Recommendations for Other Services      Precautions / Restrictions Precautions Precautions: Fall Precaution Comments: trach, low hbg Required Braces or Orthoses: Other Brace Other Brace: PRAFO LLE       Mobility Bed Mobility Overal bed mobility: Needs Assistance Bed Mobility: Supine to Sit;Sit to Supine     Supine to sit: +2 for physical assistance;Total assist Sit to supine: +2 for physical assistance;Total assist   General bed mobility comments: pt moves as a unit and not segmented. pt pivoted on pad to eob sitting. Pt does not reach floor with feet in seated EOB position  Transfers                 General transfer comment: not appropriate at this time. Dense L side weakness at this time     Balance Overall balance assessment: Needs assistance Sitting-balance support: No upper extremity supported;Feet unsupported Sitting balance-Leahy Scale: Zero Sitting balance - Comments: total reliance on therapist                                   ADL either performed or assessed with clinical judgement   ADL Overall ADL's : Needs assistance/impaired Eating/Feeding: Total assistance Eating/Feeding Details (indicate cue type and reason): provided ice chips by SLP during session Grooming: Total assistance Grooming Details (indicate cue type and reason): presented with [place] for oral care . pt is unable to lift R UE to mouth at this time                                General ADL Comments: pt tolerates session at EOB > 10 minutes with stable VSS. pt noted to have a wet breath sound with EOB sitting and could physically feel the patient's RR at EOB due to DOE. Pt following commands and visually attending to the R side     Vision   Vision Assessment?: Vision impaired- to be further tested in functional context Additional Comments: noted to have R gaze preference   Perception Perception Perception Tested?: Yes Perception Deficits: Inattention/neglect Inattention/Neglect: Does not attend to left visual field;Does not attend to  left side of body   Praxis      Cognition Arousal/Alertness: Awake/alert Behavior During Therapy: Flat affect Overall Cognitive Status: Difficult to assess Area of Impairment: Attention;Following commands                   Current Attention Level: Sustained   Following Commands: Follows one step commands consistently;Follows one step commands with increased time     Problem Solving: Slow processing General Comments: pt following commands like show two fingers, hold the adl object presented ( [place] for mouth suction) pt sustains grasp on item once placed in R hand. pt with decrease awareness to L side of the body and  no activation noted. pt with R eye deivation and unable to cross midline with cues        Exercises General Exercises - Upper Extremity Shoulder Flexion: PROM;Left;10 reps;Supine Shoulder ABduction: PROM;Left;10 reps;Supine Elbow Flexion: PROM;Left;10 reps;Supine Wrist Flexion: PROM;Left;10 reps;Supine Digit Composite Flexion: PROM;Left;10 reps;Supine Composite Extension: PROM;10 reps;Left;Supine   Shoulder Instructions       General Comments VSS    Pertinent Vitals/ Pain       Pain Assessment: No/denies pain  Home Living Family/patient expects to be discharged to:: Skilled nursing facility Living Arrangements: Children;Other relatives Available Help at Discharge: Family Type of Home: House Home Access: Level entry     Home Layout: One level     Bathroom Shower/Tub: Producer, television/film/video: Standard     Home Equipment: None   Additional Comments: no family present, obtained from chart review of earlier OT eval this admission      Prior Functioning/Environment              Frequency  Min 2X/week        Progress Toward Goals  OT Goals(current goals can now be found in the care plan section)     Acute Rehab OT Goals Patient Stated Goal: pt unable to state, PT goal to improve bed mobility and transfer quality OT Goal Formulation: Patient unable to participate in goal setting Time For Goal Achievement: 09/29/19 Potential to Achieve Goals: Good  Plan      Co-evaluation    PT/OT/SLP Co-Evaluation/Treatment: Yes Reason for Co-Treatment: For patient/therapist safety;To address functional/ADL transfers   OT goals addressed during session: ADL's and self-care;Strengthening/ROM;Proper use of Adaptive equipment and DME      AM-PAC OT "6 Clicks" Daily Activity     Outcome Measure   Help from another person eating meals?: Total Help from another person taking care of personal grooming?: Total Help from another person toileting, which  includes using toliet, bedpan, or urinal?: Total Help from another person bathing (including washing, rinsing, drying)?: Total Help from another person to put on and taking off regular upper body clothing?: Total Help from another person to put on and taking off regular lower body clothing?: Total 6 Click Score: 6    End of Session Equipment Utilized During Treatment: Oxygen  OT Visit Diagnosis: Muscle weakness (generalized) (M62.81);Cognitive communication deficit (R41.841);Hemiplegia and hemiparesis Symptoms and signs involving cognitive functions: Cerebral infarction Hemiplegia - Right/Left: Left Hemiplegia - dominant/non-dominant: Non-Dominant Hemiplegia - caused by: Cerebral infarction   Activity Tolerance Patient tolerated treatment well   Patient Left in bed;with call bell/phone within reach;with bed alarm set;with SCD's reapplied   Nurse Communication Mobility status;Precautions        Time: 2751-7001 OT Time Calculation (min): 28 min  Charges: OT General Charges $OT Visit: 1 Visit OT Evaluation $OT Re-eval:  1 Re-eval OT Treatments $Self Care/Home Management : 8-22 mins   Brynn, OTR/L  Acute Rehabilitation Services Pager: [phone] Office: [phone] .    Mateo Flow 09/24/2019, 3:24 PM

## 2019-09-25 LAB — GLUCOSE, CAPILLARY
Glucose-Capillary: 106 mg/dL — ABNORMAL HIGH (ref 70–99)
Glucose-Capillary: 129 mg/dL — ABNORMAL HIGH (ref 70–99)
Glucose-Capillary: 138 mg/dL — ABNORMAL HIGH (ref 70–99)
Glucose-Capillary: 129 mg/dL — ABNORMAL HIGH (ref 70–99)
Glucose-Capillary: 130 mg/dL — ABNORMAL HIGH (ref 70–99)

## 2019-09-25 LAB — MRSA PCR SCREENING: MRSA by PCR: NEGATIVE

## 2019-09-25 MED ORDER — CLONIDINE HCL 0.2 MG PO TABS
0.2000 mg | ORAL_TABLET | ORAL | Status: AC
  Administered 2019-09-27: 0.2 mg
  Filled 2019-09-25: qty 1

## 2019-09-25 MED ORDER — CLONIDINE HCL 0.1 MG PO TABS
0.1000 mg | ORAL_TABLET | Freq: Two times a day (BID) | ORAL | Status: AC
  Administered 2019-09-26 – 2019-09-27 (×2): 0.1 mg
  Filled 2019-09-25 (×2): qty 1

## 2019-09-25 MED ORDER — POTASSIUM CHLORIDE 20 MEQ/15ML (10%) PO SOLN
40.0000 meq | Freq: Once | ORAL | Status: AC
  Administered 2019-09-25: 40 meq
  Filled 2019-09-25: qty 30

## 2019-09-25 MED ORDER — CLONIDINE HCL 0.1 MG PO TABS
0.1000 mg | ORAL_TABLET | Freq: Four times a day (QID) | ORAL | Status: AC
  Administered 2019-09-25 – 2019-09-26 (×4): 0.1 mg
  Filled 2019-09-25 (×4): qty 1

## 2019-09-25 MED ORDER — FUROSEMIDE 10 MG/ML IJ SOLN
40.0000 mg | Freq: Once | INTRAMUSCULAR | Status: AC
  Administered 2019-09-25: 40 mg via INTRAVENOUS
  Filled 2019-09-25: qty 4

## 2019-09-25 MED ORDER — JEVITY 1.2 CAL PO LIQD
1000.0000 mL | ORAL | Status: DC
  Administered 2019-09-25 – 2019-10-02 (×4): 1000 mL
  Filled 2019-09-25 (×13): qty 1000

## 2019-09-25 MED ORDER — PROSOURCE TF PO LIQD
45.0000 mL | Freq: Three times a day (TID) | ORAL | Status: DC
  Administered 2019-09-25 – 2019-11-01 (×106): 45 mL
  Filled 2019-09-25 (×108): qty 45

## 2019-09-25 MED ORDER — VITAL AF 1.2 CAL PO LIQD: 1000.0000 mL | ORAL | Status: DC

## 2019-09-25 NOTE — Progress Notes (Signed)
Patient transferred to 2w19. Family updated.

## 2019-09-25 NOTE — Progress Notes (Addendum)
NAME:  Donna ChampionGarnette S [name], MRN:  [id number], DOB:  21-Aug-1950, LOS: 31 ADMISSION DATE:  08/25/2019, CONSULTATION DATE:  08/27/2019 REFERRING MD:  Dr. 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  Admitted by Neuro IR on 6/14 for cerebral angiogram s/p RT ICA cervical/ petrous junction balloon angioplasty for severe stenosis via right femoral approach.  Was placed on ASA/ brillinta.  Monitored in Neuro ICU.  Noted to have developed hypotension, increased left sided weakness and lethargy on 6/15 am with Hgb drop from 13.3 to 6.9 with new AKI.  Post MRI/ MRA showed scattered small acute infarcts along the right MCA/ watershed territory suspected to be resultant either post procedure vs hypoperfusion.    A CT abd/ pelvis was obtained given concern for retroperitoneal bleed which showed a mild to moderate amount of retroperitoneal hematoma in the right iliac and inguinal regiones with moderate size hematoma noted the the soft tissues anterior to the musculature of the right hip; no definite intrapertioneal hemorrhage. INR, PT, and platelets rechecked and were wnl.  Vascular surgery was consulted with plans to observe given bleeding was mostly into the muscle and tissue.  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   7/4 - TRACHEOSTOMY  7/11 - pall  care meeting - full code. Patient feels less congested, ventilatory setting was adjusted to see if she can be liberated from ventilator, avoiding tissue hypoxemia    7/12 - afebrile since 7/8. + 22L since admit. RN feels patient volume overloaded.Does SBT dailly of varying duration few to several hours and then gets fatigued with tachypnea and tachycardia and agitation. Left sided dense hemiplegia continues. Periodically pruposeful on right side PRecedex continues but unsure if patient needs it/ Diarrhea + - on docusate   7/13 - On PSV via trach. Still unable to come off precedex gtt. ? Diarrhea btter after stopping docusate. No labs as part of blood conservation. Got lasix yesterday. Last dose zosyn today. Diarrhea + but improved after dc laxative. Needed prn for high BP last night. Trach sutures removed   7/13 - Off precedex since last night. Awake on ATC. Following commands on right slowly. Dense hemiplegia on left. Volume:  down to +17.3 L positive/ TF reduced from 60 -> 30cc yesterday due to ddiarrha and then got hypoglycemic. D10w started - > diarrhea better.    Consults:  NIR - primary Neurology (stroke has signed off 6/24) Vascular surgery  PCCM Palliative Care Hematology  Procedures:    ETT 6/19 > 6/23,  6/23 > Trach 7/4  Significant Diagnostic Tests:   6/14 cerebral angiogram s/p RT ICA cer/petrous junction balloon angioplasty for severe stenosis.   6/15 MRA/ MRI brain  > Scattered small acute infarcts along the right MCA/watershed territory. Solitary small acute infarct in the left parietal cortex and right cerebellum. Improved right ICA patency at the petrous segment. Known severe right M1 segment stenosis. High-grade left  V4 and moderate mid basilar stenoses.  6/15 CT a/p wo contrast > Mild to moderate amount of retroperitoneal hematoma is noted in the right iliac and inguinal regions. Moderate size hematoma is noted in the soft tissues anterior to the musculature of the  right hip. No definite intraperitoneal hemorrhage is noted. Moderate size fat containing periumbilical hernia. Aortic Atherosclerosis.   Echo 6/16 > normal LVEF  6/29 MRI brain > Progressed size and confluence of ischemia throughout much of the right hemisphere white matter since 08/26/2019. Associated petechial blood products but no malignant hemorrhagic transformation or mass Effect. There are also multiple new lacunar type infarcts elsewhere, including the brainstem and the left basal ganglia. Underlying chronic infarcts of the right basal ganglia and left Cerebellum.    Micro Data:  6/10 SARS 2 >> neg 6/14 MRSA PCR >> neg 6/20 trach asp >> few diptheroids/ corynebacterium species 6/18 BC x 2 >> neg 7/6 Morganella  Antimicrobials:  6/14 cefazolin pre-op  6/20 unasyn >> 09/02/2019 7/6 vancomycin >> 7/8 7/6 Zosyn>> 7/12 (Morganella)  Interim history/subjective:    7/15 - down to +15.6L net volume with lasix. DOing ATC x 48h. Dense left hemiplegia. Blood sugars better with D10 and increaed TF and reduced long acting insulin. No diahrrea with TF at 30cc/h . Still off precedex. Not on pressors.   Objective   Blood pressure (!) 126/54, pulse 81, temperature 98.1 F (36.7 C), temperature source Axillary, resp. rate 18, height 5\' 1"  (1.549 m), weight 90.9 kg, SpO2 100 %.    FiO2 (%):  [28 %] 28 %   Intake/Output Summary (Last 24 hours) at 09/25/2019 0746 Last data filed at 09/25/2019 0700 Gross per 24 hour  Intake 1330.71 ml  Output 3070 ml  Net -1739.29 ml   Filed Weights   09/22/19 0500 09/23/19 0700 09/25/19 0500  Weight: 95.3 kg 88.7 kg 90.9 kg    Examination:    General Appearance:  Looks better . Obese + Head:  Normocephalic, without obvious abnormality, atraumatic Eyes:  PERRL - yes, conjunctiva/corneas - muddy     Ears:  Normal external ear canals, both ears Nose:  G tube - panda + Throat:  ETT TUBE - no , OG tube - no. TRACH +. On ATC Neck:  Supple,  No  enlargement/tenderness/nodules Lungs: Clear to auscultation bilaterally, Heart:  S1 and S2 normal, no murmur, CVP - no.  Pressors - no Abdomen:  Soft, no masses, no organomegaly Genitalia / Rectal:  Not done Extremities:  Extremities- intact Skin:  ntact in exposed areas . Sacral area - not examined Neurologic:  Sedation - none -> RASS - +1 . Moves all 4s - no. Has left hemiplegia. CAM-ICU - cannot test . Orientation - follows simple commands     Resolved Hospital Problem list   Hypotension  AKI Sepsis VAP - Morganella s/p zosyn ending 09/23/19  Assessment & Plan:   Right MCA CVA due to bilateral intracranial stenoses  -s/p RT ICA cer/petrous junction balloon angioplasty for severe stenosis c/b right thigh and retroperitoneal hematoma.   7/15 - more awake. Off precedex. Dense hemiplegia on left. Follows command on right   P: PT/OT as able  Nutrition and bowel regiment  Seizure precautions  Continue secondary stroke prevention  Avoid anticoagulation or antiplatelets given high risk for bleed Continue atorvastatin   Acute encephalopathy  7/54  - improved  after seroquel increase 2 night agao, clonidine taper (since 7/13)  and continued diuresis. Also on prn fent/oxycodeone. Off precedex  x 48h. EKG Qtc  Plan - cotninue seroquel at 100mg  QHS (has helped come off precedex) - check QTc prn - clonidine taper to off per pharmacy protocol - end date 09/27/19 - - fent prn  -oxycodone prn    Acute on chronic respiratory failure with hypoxia requiring mechanical ventilation.  -Failed trial of extubation due to upper airway stridor.  Cords visibly scarred on reintubation. S/P trach 7/4  09/25/2019 - now on ATC  X 48h. Diuresis and seroquel helped.   P: ATC  But if increased secretions or resp distress connnect back to vent  Volume Overload with hypertension  7/12 - 22L since positive since admit - 7/15 - +15-16L since admit   Plan  - hold free water  - lasix x 1  repeat 7/15 -  Daily decision on lasix - check bmet 09/26/19 - PRN bp meds   Acute blood loss anemia  -> Anemia of critical illness : - Jehovah's Witness, no blood products including albumin.    7/14: Most recent  hgb 4-5gm% on 7/10. No active bleeding currently  P: Continue severe anemia protocol: Iron, Folic acid, vitamin B12,  Monitor off  Epogen and Vit C that was previously given Limit blood draws to every 4-7 days as needed - aim next 09/26/19 Expect slow recovery  Diarrhea  7/15 - no diarrha with 30cc/h of Vital. Per nutrition this is optimal blend  Plan  - increase TF to 40cc/h   Uncontrolled diabetes with hyperglycemia  7/14 - hypoglycemic on 10cc/h of Vital  7/15  0 no hypoglycemia with d10 and reduced long acting insulin and vital at 30cc/h   P: ssi - halve glargine from 32U to 16U on 09/24/19 Dc d10 Increase Vital to 40cc/h   Hypertension  Plan  cointinue noravsc, hydralazine scheduled  prn labetalol  Prn hydralaszing SBP goal < 160 per neuro - might need reassesment over time  Daily Goals Checklist  Pain/Anxiety/Delirium protocol (if indicated): prn agents   VAP protocol (if indicated): Bundle in place  Respiratory support goals: Daily SBT  Blood pressure target: Keep systolic blood pressure less than 160  DVT prophylaxis: Mechanical DVT prophylaxis only given anemia and high bleeding risk.  Nutrition Status: Tolerating tube feeds. Increase TF to 40cc/h. . Likely needs PEG (  Per SLP 7/12- not ready for PMV and swallow.)  GI prophylaxis: Pepcid  Urinary catheter: External catheter  Central lines: PICC line  Glucose control: ssi  Mobility/therapy needs: PT OT   Labss: No daily  labs but once a week + prn - next check 09/26/19  Code Status: Full code.  Family Communication: Son Mialani Reicks updated -> he says he has agreed on LTAC. Says patients sister has already visited [facility] but is going to check on [facility] 09/23/19 -> will decide next  24h . Called son 7/14 - went to [facility] That was not set up. Updated sone 09/25/19   Disposition: ICU. (needs LTAC - case manager working on this with family) -> move to [facility] for 09/26/19 . Son says they have decided on [facility] for [facility] - . Per Case manager - insurance approval pending for [facility]. Move to SDU 09/25/2019 and then [facility] from 09/26/19 - d/w Dr 09/28/19  If [facility] bed available 09/26/19 - then ccm will do discharge. Otherwise from 09/26/19 onwards [facility]    SIGNATURE    Dr. 09/28/19, M.D., F.C.C.P,  Pulmonary and Critical Care Medicine Staff Physician, [facility]  Director - Interstitial Lung Disease  Program  Pulmonary Fibrosis [facility] at [facility], [place], [address]  Pager: [phone], If no answer or between  15:00h - 7:00h: call [phone] Telephone: [phone]  7:46 AM 09/25/2019    LABS    PULMONARY No results for input(s): PHART, PCO2ART, PO2ART, HCO3, TCO2, O2SAT in the last 168 hours.  Invalid input(s): PCO2, PO2  CBC Recent Labs  Lab 09/18/19 0923 09/20/19 0500  HGB 4.7* 4.7*  HCT 16.4* 17.5*  WBC 15.3* 10.4  PLT 435* 447*    COAGULATION No results for input(s): INR in the last 168 hours.  CARDIAC  No results for input(s): TROPONINI in the last 168 hours. No results for input(s): PROBNP in the last 168 hours.   CHEMISTRY Recent Labs  Lab 09/18/19 0923 09/18/19 0923 09/20/19 0500 09/21/19 0500  NA 142  --  144 142  K 4.4   < > 3.1* 3.3*  CL 103  --  104 103  CO2 27  --  28 30  GLUCOSE 151*  --  181* 145*  BUN 42*  --  47* 41*  CREATININE 1.01*  --  0.97 0.81  CALCIUM 8.7*  --  8.5* 8.6*  MG 2.7*  --   --   --   PHOS 3.9  --   --   --    < > = values in this interval not displayed.   Estimated Creatinine Clearance: 68.2 mL/min (by C-G formula based on SCr of 0.81 mg/dL).   LIVER Recent Labs  Lab 09/20/19 0500 09/21/19 0500   AST 27 31  ALT 29 36  ALKPHOS 95 81  BILITOT 0.7 0.3  PROT 7.4 7.1  ALBUMIN 1.5* 1.6*     INFECTIOUS No results for input(s): LATICACIDVEN, PROCALCITON in the last 168 hours.   ENDOCRINE CBG (last 3)  Recent Labs    09/24/19 2301 09/25/19 0300 09/25/19 0727  GLUCAP 73 106* 129*         IMAGING x48h  - image(s) personally visualized  -   highlighted in bold No results found.

## 2019-09-25 NOTE — TOC Progression Note (Signed)
Transition of Care [facility]) - Progression Note    Patient Details  Name: Donna [name] MRN: [id number] Date of Birth: April 08, 1950  Transition of Care [facility]) CM/SW Contact  Bess Kinds, RN Phone Number: [phone] 09/25/2019, 8:51 AM  Clinical Narrative:     Received call from MD this AM. Family has chosen [facility]. Spoke with Irving Burton at [facility]. Insurance authorization was initiated yesterday. Will transition patient to LTAC once authorization received. TOC following for transition needs.   Expected Discharge Plan: Long Term Acute Care (LTAC) Barriers to Discharge: Continued Medical Work up  Expected Discharge Plan and Services Expected Discharge Plan: Long Term Acute Care (LTAC)                                               Social Determinants of Health (SDOH) Interventions    Readmission Risk Interventions No flowsheet data found.

## 2019-09-25 NOTE — Progress Notes (Addendum)
Nutrition Follow-up  DOCUMENTATION CODES:   Not applicable  INTERVENTION:   Tube feeding via Cortrak: - Jevity 1.2 @ 30 ml/hr and increase by 10 ml every 12 hours to goal of 50 ml/hr (1200 ml/day) - ProSource TF 45 ml TID  Tube feeding regimen provides 1560 kcal, 99 grams of protein, and 973 ml of H2O.   NUTRITION DIAGNOSIS:   Inadequate oral intake related to inability to eat as evidenced by NPO status.  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  Cortrak remains in place, may need long term feeding access. Working with SLP now that on [place].  Per notes pt on TC x 48 hours Per RN TF decreased due to diarrhea, will adjust formulas today as pt now on TC and pending placement to [facility]    6/14 - s/p balloon angioplasty for severe stenosis, pt developed lethargy and increased L sided weakness found to have new watershed infarcts, started on vasopressors for cerebral perfusion 6/16 - cortrak placed, tip in stomach 6/17 - pt with increased agitation requiring increased O2 6/18 - pt pulled out Cortrak, Cortrak replaced 6/19 - Cortrak removed 6/20 - intubated 6/23 - Cortrak replaced (tip gastric per Cortrak team), extubated, later reintubated 7/3 s/p trach    Medications reviewed and include: pepcid, ferrous sulfate TID with meals, SSI q 4 hours, Novolog 4 units q 4 hours, Lantus 16 units daily Labs reviewed  Weight up by 15 kg; per MD pt is 15.6 L net positive  Diet Order:   Diet Order            Diet NPO time specified  Diet effective midnight                 EDUCATION NEEDS:   Not appropriate for education at  this time  Skin:  Skin Assessment: Skin Integrity Issues: Incisions: right groin  Last BM:  400 ml via rectal tube x 24 hours  Height:   Ht Readings from Last 1 Encounters:  08/31/19 5' 1"  (1.549 m)    Weight:   Wt Readings from Last 1 Encounters:  09/25/19 90.9 kg    Ideal Body Weight:  47.7 kg  BMI:  Body mass index is 37.86 kg/m.  Estimated Nutritional Needs:   Kcal:  1550-1750  Protein:  90-105 grams  Fluid:  >1.6 L/day  Lockie Pares., RD, LDN, CNSC See AMiON for contact information

## 2019-09-25 NOTE — Progress Notes (Signed)
Referring Physician(s):  Melvenia Beam  Supervising Physician: Luanne Bras  Patient Status:  [facility] - In-pt  Chief Complaint: Right ICA cervical/petrous junction stenosis s/p revascularization using balloon angioplasty via right radial and right femoral Mild-moderate right thigh (with some retroperitoneal component) hematoma.  Subjective: Acute blood loss anemia, Jehovah's Witness. Patient awake laying in bed, tracheostomy in place, no sedation.Shefollows simple commands. More alert today, although was not able to tolerate long stretches with PMSV.  Neuro exam stable Most recenthgbwas4.7 on 09/20/2019.   Allergies: Patient has no known allergies.  Medications: Prior to Admission medications   Medication Sig Start Date End Date Taking? Authorizing Provider  amLODipine (NORVASC) 10 MG tablet Take 1 tablet (10 mg total) by mouth daily. 05/12/19  Yes Minette Brine, FNP  aspirin EC 81 MG tablet Take 81 mg by mouth every evening.   Yes [provider]  Calcium Carbonate-Vitamin D (CALCIUM-D PO) Take 2 tablets by mouth daily at 12 noon.    Yes [provider]  carvedilol (COREG) 6.25 MG tablet TAKE 1 TABLET(6.25 MG) BY MOUTH TWICE DAILY Patient taking differently: Take 6.25 mg by mouth 2 (two) times daily with a meal.  07/31/19  Yes Minette Brine, FNP  hydrALAZINE (APRESOLINE) 50 MG tablet Take 1 tablet (50 mg total) by mouth 2 (two) times daily. 06/16/19 09/14/19 Yes Elouise Munroe, MD  Insulin Degludec-Liraglutide (XULTOPHY) 100-3.6 UNIT-MG/ML SOPN Inject 30 Units into the skin daily. 08/14/19  Yes Minette Brine, FNP  telmisartan-hydrochlorothiazide (MICARDIS HCT) 40-12.5 MG tablet Take 1 tablet by mouth daily. 07/31/19  Yes Minette Brine, FNP  ticagrelor (BRILINTA) 90 MG TABS tablet Take 1 tablet (90 mg total) by mouth 2 (two) times daily. 06/12/19  Yes Elouise Munroe, MD  vitamin C (ASCORBIC ACID) 250 MG tablet Take 500 mg by mouth daily at 12 noon.   Yes  [provider]  blood glucose meter kit and supplies KIT Dispense based on patient and insurance preference. Use up to four times daily as directed. (FOR ICD-9 250.00, 250.01). 05/10/18   Minette Brine, FNP  Blood Glucose Monitoring Suppl (TRUE METRIX METER) w/Device KIT 1 each by Does not apply route in the morning, at noon, in the evening, and at bedtime. 05/22/19   Minette Brine, FNP  Evolocumab (REPATHA SURECLICK) 003 MG/ML SOAJ Inject 140 mg into the skin every 14 (fourteen) days. 06/30/19   Elouise Munroe, MD  glucose blood (TRUE METRIX BLOOD GLUCOSE TEST) test strip Check blood sugar 4 times a day before meals and bedtime 05/22/19   Minette Brine, FNP  Insulin Glargine-Lixisenatide (SOLIQUA) 100-33 UNT-MCG/ML SOPN Inject 25 Units into the skin daily. Patient taking differently: Inject 30 Units into the skin daily.  07/14/19   Minette Brine, FNP  Multiple Vitamin (MULTIVITAMIN PO) Take 1 tablet by mouth daily at 12 noon.     [provider]  Omega-3 Fatty Acids (OMEGA-3 PLUS PO) Take 2 capsules by mouth daily at 12 noon.    [provider]     Vital Signs: BP (!) 139/54   Pulse 82   Temp 98.5 F (36.9 C) (Axillary)   Resp (!) 35   Ht 5' 1"  (1.549 m)   Wt 200 lb 6.4 oz (90.9 kg)   SpO2 100%   BMI 37.86 kg/m   Physical Exam Vitals and nursing note reviewed.   NAD, grimaces.  Does not track with eyes, but follows simple commands.  On trach collar.  Neuro: Tracheostomy without sedation.  More alert today, follows simple commands of right side. PERRL bilaterally. Follows commands with right side (wiggles right toes and attempts to bend right knee, grips with right hand and bends right elbow), demonstrates left-sided neglect.  Imaging: DG CHEST PORT 1 VIEW  Result Date: 09/22/2019 CLINICAL DATA:  PICC placement. EXAM: PORTABLE CHEST 1 VIEW COMPARISON:  September 16, 2019. FINDINGS: Stable cardiomediastinal silhouette. Tracheostomy and feeding tubes are  unchanged in position. No pneumothorax or pleural effusion is noted. Stable bilateral lung opacities are noted concerning for possible inflammation. Right-sided PICC line is again noted with distal tip in expected position of the SVC. Bony thorax is unremarkable. IMPRESSION: Stable support apparatus. Stable bilateral lung opacities are noted concerning for possible inflammation. No pneumothorax is noted. Aortic Atherosclerosis (ICD10-I70.0). Electronically Signed   By: Marijo Conception M.D.   On: 09/22/2019 12:49    Labs:  CBC: Recent Labs    09/15/19 1211 09/17/19 0703 09/18/19 0923 09/20/19 0500  WBC 14.9* 17.5* 15.3* 10.4  HGB 4.9* 5.6* 4.7* 4.7*  HCT 17.5* 19.0* 16.4* 17.5*  PLT 524* 448* 435* 447*    COAGS: Recent Labs    11/27/18 0728 08/18/19 0705 08/26/19 1715 08/27/19 0230  INR 0.9 0.9 1.1 1.2    BMP: Recent Labs    09/17/19 0703 09/18/19 0923 09/20/19 0500 09/21/19 0500  NA 142 142 144 142  K 4.0 4.4 3.1* 3.3*  CL 103 103 104 103  CO2 26 27 28 30   GLUCOSE 95 151* 181* 145*  BUN 38* 42* 47* 41*  CALCIUM 8.7* 8.7* 8.5* 8.6*  CREATININE 0.84 1.01* 0.97 0.81  GFRNONAA >60 57* >60 >60  GFRAA >60 >60 >60 >60    LIVER FUNCTION TESTS: Recent Labs    09/15/19 1211 09/17/19 0703 09/20/19 0500 09/21/19 0500  BILITOT 0.8 1.1 0.7 0.3  AST 20 32 27 31  ALT 26 23 29  36  ALKPHOS 72 82 95 81  PROT 6.7 6.6 7.4 7.1  ALBUMIN 1.7* 1.6* 1.5* 1.6*    Assessment and Plan: Right ICA cervical/petrous junction stenosis s/p revascularization using balloon angioplasty via right radial and right femoral approach6/14/2021by Dr. Estanislado Pandy. Mild-moderate right thigh (with some retroperitoneal component) hematoma. Acute blood loss anemia, Jehovah's Witness. Patient's condition stable-s/p trach,on trach collar, more alert today, follows simple commands of right side, demonstrates left-sided neglect. Off precedex today.   Hgb 4.7on 09/20/2019-receiving supplementation  per severe anemia protocol.  Working with SLP/OT for swallow evaluation. She remains at high risk of aspiration, however does appear to have a functional swallow.  Difficulty tolerating PMV for extended periods of time.   Continue to hold Brilinta/Aspirin/Lovenox.Agree with CCM plans toavoid excess blood drawl. Further plans per CCM/neurology/palliative care- appreciate and agree with management. NIR to follow.   Electronically Signed: Docia Barrier, PA 09/25/2019, 9:28 AM   I spent a total of 15 Minutes at the the patient's bedside AND on the patient's hospital floor or unit, greater than 50% of which was counseling/coordinating care for right ICA stenosis, right thigh hematoma.

## 2019-09-26 ENCOUNTER — Ambulatory Visit: Payer: Self-pay

## 2019-09-26 DIAGNOSIS — E11319 Type 2 diabetes mellitus with unspecified diabetic retinopathy without macular edema: Secondary | ICD-10-CM

## 2019-09-26 DIAGNOSIS — I10 Essential (primary) hypertension: Secondary | ICD-10-CM

## 2019-09-26 LAB — COMPREHENSIVE METABOLIC PANEL
ALT: 42 U/L (ref 0–44)
AST: 31 U/L (ref 15–41)
Albumin: 1.8 g/dL — ABNORMAL LOW (ref 3.5–5.0)
Alkaline Phosphatase: 57 U/L (ref 38–126)
Anion gap: 5 (ref 5–15)
BUN: 17 mg/dL (ref 8–23)
CO2: 30 mmol/L (ref 22–32)
Calcium: 8.7 mg/dL — ABNORMAL LOW (ref 8.9–10.3)
Chloride: 106 mmol/L (ref 98–111)
Creatinine, Ser: 0.58 mg/dL (ref 0.44–1.00)
GFR calc Af Amer: >60 mL/min (ref >60)
GFR calc non Af Amer: >60 mL/min (ref >60)
Glucose, Bld: 141 mg/dL — ABNORMAL HIGH (ref 70–99)
Potassium: 3.8 mmol/L (ref 3.5–5.1)
Sodium: 141 mmol/L (ref 135–145)
Total Bilirubin: 0.5 mg/dL (ref 0.3–1.2)
Total Protein: 5.5 g/dL — ABNORMAL LOW (ref 6.5–8.1)

## 2019-09-26 LAB — CBC
HCT: 22.5 % — ABNORMAL LOW (ref 36.0–46.0)
Hemoglobin: 6.8 g/dL — CL (ref 12.0–15.0)
MCH: 30.8 pg (ref 26.0–34.0)
MCHC: 30.2 g/dL (ref 30.0–36.0)
MCV: 101.8 fL — ABNORMAL HIGH (ref 80.0–100.0)
Platelets: 247 K/uL (ref 150–400)
RBC: 2.21 MIL/uL — ABNORMAL LOW (ref 3.87–5.11)
RDW: 22.1 % — ABNORMAL HIGH (ref 11.5–15.5)
WBC: 11.3 K/uL — ABNORMAL HIGH (ref 4.0–10.5)
nRBC: 1.3 % — ABNORMAL HIGH (ref 0.0–0.2)

## 2019-09-26 LAB — GLUCOSE, CAPILLARY
Glucose-Capillary: 104 mg/dL — ABNORMAL HIGH (ref 70–99)
Glucose-Capillary: 135 mg/dL — ABNORMAL HIGH (ref 70–99)
Glucose-Capillary: 141 mg/dL — ABNORMAL HIGH (ref 70–99)
Glucose-Capillary: 146 mg/dL — ABNORMAL HIGH (ref 70–99)
Glucose-Capillary: 158 mg/dL — ABNORMAL HIGH (ref 70–99)
Glucose-Capillary: 159 mg/dL — ABNORMAL HIGH (ref 70–99)
Glucose-Capillary: 166 mg/dL — ABNORMAL HIGH (ref 70–99)

## 2019-09-26 LAB — MAGNESIUM: Magnesium: 1.7 mg/dL (ref 1.7–2.4)

## 2019-09-26 LAB — PHOSPHORUS: Phosphorus: 3.1 mg/dL (ref 2.5–4.6)

## 2019-09-26 MED ORDER — FUROSEMIDE 10 MG/ML IJ SOLN
40.0000 mg | Freq: Once | INTRAMUSCULAR | Status: AC
  Administered 2019-09-26: 40 mg via INTRAVENOUS
  Filled 2019-09-26: qty 4

## 2019-09-26 MED ORDER — HYDRALAZINE HCL 50 MG PO TABS
100.0000 mg | ORAL_TABLET | Freq: Three times a day (TID) | ORAL | Status: DC
  Administered 2019-09-26 – 2019-09-28 (×7): 100 mg
  Filled 2019-09-26 (×7): qty 2

## 2019-09-26 NOTE — Progress Notes (Signed)
  Speech Language Pathology Treatment: Dysphagia;Passy Muir Speaking valve  Patient Details Name: Donna [name] MRN: [id number] DOB: 1950-03-16 Today's Date: 09/26/2019 Time: 1761-6073 SLP Time Calculation (min) (ACUTE ONLY): 18 min  Assessment / Plan / Recommendation Clinical Impression  Pt seen for swallow and communication tx with speaking valve. She was mildly responsive to questions, staring at different places in room, distracted. Additional time given to respond and cueing yielded head gestures responses x 2 throughout session. With speaking valve donned, air is moved to vocal cords for humming sound and exhalation through nose and mouth. She cleared her throat clear but no attempts to mouth words. Once pt appropriate for trach change, please use cuffless if appropriate.  Oral care provided and minimal manipulation with several ice chips resulting in weak and delayed swallows. No other po's given at present. She is significantly deconditioned and will need time and continued therapies to improve. Continue oral care and ST.      HPI HPI: 69 y.o. female with PMH significant for HTN, HLD, CVA 06/2018, DM, anemia, obesity, OSA, and OA. Pt underwent R ICA angioplasty on 6/14. Pt with decline in neuro status and hemoglobin on 6/15, found to have a large R groin hematoma.  Pt has had Hbg 5.0 or less and is a Jehovah's witness. MRI showed scattered small acute infarcts along the right MCA/watershed territory. Solitary small acute infarct in the left parietal cortex and right cerebellum. She was intubated on 6/19. Pt underwent tracheostomy on 7/3. PT found to be septic with RLL PNA on 7/6.       SLP Plan  Continue with current plan of care       Recommendations  Diet recommendations: NPO Medication Administration: Via alternative means      Patient may use Passy-Muir Speech Valve: with SLP only PMSV Supervision: Full MD: Please consider changing trach tube to : Smaller size;Cuffless          Oral Care Recommendations: Oral care QID Follow up Recommendations: Skilled Nursing facility;24 hour supervision/assistance SLP Visit Diagnosis: Dysphagia, unspecified (R13.10);Aphonia (R49.1) Plan: Continue with current plan of care                       Royce Macadamia 09/26/2019, 11:19 AM  Breck Coons Lonell Face.Ed Nurse, children's [phone] Office [phone]

## 2019-09-26 NOTE — Chronic Care Management (AMB) (Signed)
  Chronic Care Management   Inpatient Admit Review Note  09/26/2019 Name: Donna [name] MRN: [id number] DOB: 01-15-1951  Donna [name] is a 69 y.o. year old female who is a primary care patient of Arnette Felts, FNP. Donna [name] is actively engaged with the embedded care management team in the primary care practice and is being followed by RN Case Manager BSW for assistance with disease management and care coordination needs related to HTN and DMII.   Donna [name] is currently admitted to the hospital for evaluation and treatment of cerebral infarction, hypokalemia, and acute respiratory failure. Current discharge plan is:  Received call from MD this AM. Family has chosen [facility]. Spoke with Irving Burton at [facility]. Insurance authorization was initiated yesterday. Will transition patient to LTAC once authorization received. TOC following for transition needs.   Plan: Embedded care management team will continue to follow until patient confirmed to discharge to LTAC. Once patient is discharged to [facility] embedded care management team will close patient program.   Bevelyn Ngo, BSW, CDP Social Worker, Certified Dementia Practitioner [facility] Care Management [phone]

## 2019-09-26 NOTE — Progress Notes (Signed)
Physical Therapy Treatment Patient Details Name: Donna [name] MRN: [id number] DOB: 12/22/1950 Today's Date: 09/26/2019    History of Present Illness 69 y.o. female with PMH significant for HTN, HLD, CVA 06/2018, DM, anemia, obesity, OSA, and OA. She is known to [facility] and has been followed by Dr. Corliss Skains since 10/2018. Pt underwent R ICA angioplasty on 6/14. Pt with decline in neuro status and hemoglobin on 6/15, found to have a large R groin hematoma.  Pt has had Hbg 5.0 or less and is a Jehovah's witness.  She was intubated on 6/19. Pt underwent tracheostomy on 7/3. PT found to be septic with RLL PNA on 7/6. 7/13 trach tolerating extubation    PT Comments    Pt nods "yes" agreeable to PT today. Pt sat EOB ~10 minutes with min-mod posterior truncal support, tolerating LE exercise and dynamic sitting tasks throughout with stable VSs. Pt following commands well this day, but does require increased time for processing. LUE remains flaccid, LUE with mild hypertonicity with no active movement noted this day. PT to continue to follow acutely.    Follow Up Recommendations  SNF     Equipment Recommendations  Wheelchair (measurements PT);Wheelchair cushion (measurements PT);Hospital bed    Recommendations for Other Services       Precautions / Restrictions Precautions Precautions: Fall Precaution Comments: trach, low hbg Required Braces or Orthoses: Other Brace Other Brace: PRAFO LLE Restrictions Weight Bearing Restrictions: No    Mobility  Bed Mobility Overal bed mobility: Needs Assistance Bed Mobility: Supine to Sit;Sit to Supine     Supine to sit: Total assist;+2 for physical assistance;HOB elevated Sit to supine: +2 for physical assistance;Total assist;HOB elevated   General bed mobility comments: Total +2 for trunk elevation/lowering, LE management, scooting to and from EOB with use of bed pads.  Transfers                 General transfer comment: not attempted, too  weak  Ambulation/Gait                 Stairs             Wheelchair Mobility    Modified Rankin (Stroke Patients Only) Modified Rankin (Stroke Patients Only) Pre-Morbid Rankin Score: No symptoms Modified Rankin: Severe disability     Balance Overall balance assessment: Needs assistance Sitting-balance support: Single extremity supported;Feet supported Sitting balance-Leahy Scale: Poor Sitting balance - Comments: Sat EOB x10 minutes, requires min-mod assist to maintain sitting EOB but pt engaging RUE on bedrail to assist. LUE WB achieved with PT support, able to prop on L elbow and return to upright sit x10 with PT physical truncal and HHA Postural control: Posterior lean;Left lateral lean                                  Cognition Arousal/Alertness: Awake/alert Behavior During Therapy: Flat affect Overall Cognitive Status: Difficult to assess Area of Impairment: Attention;Following commands                   Current Attention Level: Sustained   Following Commands: Follows one step commands consistently;Follows one step commands with increased time Safety/Judgement: Decreased awareness of safety;Decreased awareness of deficits Awareness: Intellectual Problem Solving: Slow processing General Comments: follows 1 step commands with very increased time, benefits from tactile and verbal cuing used in conjunction.      Exercises General Exercises - Lower Extremity Ankle Circles/Pumps:  AAROM;Right;PROM;Left;10 reps;Supine Quad Sets: AROM;Right;10 reps;Supine Long Arc Quad: AAROM;Both;5 reps;Seated Heel Slides: AAROM;Right;PROM;Left;10 reps;Supine Hip ABduction/ADduction: AAROM;Right;PROM;Left;10 reps;Supine    General Comments General comments (skin integrity, edema, etc.): HRmax 109 bpm, RR 40 upon PT arrival to room and lowered to 20-25 breaths/min sitting EOB. SpO2 stable and WFL, superficial suction assist (visible in trach site) and  notified RN for need of deep suction      Pertinent Vitals/Pain Pain Assessment: Faces Faces Pain Scale: Hurts a little bit Pain Location: generalized, during mobility Pain Descriptors / Indicators: Grimacing Pain Intervention(s): Limited activity within patient's tolerance;Monitored during session;Repositioned    Home Living                      Prior Function            PT Goals (current goals can now be found in the care plan section) Acute Rehab PT Goals Patient Stated Goal: pt unable to state, PT goal to improve bed mobility and transfer quality PT Goal Formulation: Patient unable to participate in goal setting Time For Goal Achievement: 10/10/19 Potential to Achieve Goals: Fair Progress towards PT goals: Progressing toward goals    Frequency    Min 3X/week      PT Plan Current plan remains appropriate    Co-evaluation              AM-PAC PT "6 Clicks" Mobility   Outcome Measure  Help needed turning from your back to your side while in a flat bed without using bedrails?: Total Help needed moving from lying on your back to sitting on the side of a flat bed without using bedrails?: Total Help needed moving to and from a bed to a chair (including a wheelchair)?: Total Help needed standing up from a chair using your arms (e.g., wheelchair or bedside chair)?: Total Help needed to walk in hospital room?: Total Help needed climbing 3-5 steps with a railing? : Total 6 Click Score: 6    End of Session Equipment Utilized During Treatment: Oxygen (TC) Activity Tolerance: Patient tolerated treatment well Patient left: in bed;with nursing/sitter in room;with SCD's reapplied;with call bell/phone within reach;Other (comment) (LUE propped with blanket stack for protection and neutral alignment) Nurse Communication: Mobility status;Need for lift equipment PT Visit Diagnosis: Other abnormalities of gait and mobility (R26.89);Muscle weakness (generalized)  (M62.81);Other symptoms and signs involving the nervous system (R29.898);Hemiplegia and hemiparesis Hemiplegia - Right/Left: Left Hemiplegia - dominant/non-dominant: Non-dominant Hemiplegia - caused by: Cerebral infarction     Time: 6270-3500 PT Time Calculation (min) (ACUTE ONLY): 33 min  Charges:  $Therapeutic Exercise: 8-22 mins $Neuromuscular Re-education: 8-22 mins                    Domonik Levario E, PT Acute Rehabilitation Services Pager [phone]  Office [phone]   Jaylanie Boschee D Elania Crowl 09/26/2019, 4:08 PM

## 2019-09-26 NOTE — Progress Notes (Signed)
PROGRESS NOTE    Donna [name]  EOF:[id number] DOB: 06-20-1950 DOA: 08/25/2019 PCP: Minette Brine, FNP   Brief Narrative:  69 year old female Jehovah witness,  with prior history of HTN, HLD, CVA (06/2018- right MCA territory stroke with residual mild left hemiparesis), multiple intracranial stenosis including anterior/ posterior circulation, DM, anemia, obesity, OSA, and arthritis.  Admitted by Neuro IR on 6/14 for cerebral angiogram s/p RT ICA cervical/ petrous junction balloon angioplasty for severe stenosis via right femoral approach.  Was placed on ASA/ brillinta.  Monitored in Neuro ICU.  Noted to have developed hypotension, increased left sided weakness and lethargy on 6/15 am with Hgb drop from 13.3 to 6.9 with new AKI.  Post MRI/ MRA showed scattered small acute infarcts along the right MCA/ watershed territory suspected to be resultant either post procedure vs hypoperfusion.    A CT abd/ pelvis was obtained given concern for retroperitoneal bleed which showed a mild to moderate amount of retroperitoneal hematoma in the right iliac and inguinal regiones with moderate size hematoma noted the the soft tissues anterior to the musculature of the right hip; no definite intrapertioneal hemorrhage. INR, PT, and platelets rechecked and were wnl.  Vascular surgery was consulted with plans to observe given bleeding was mostly into the muscle and tissue.  Now became septic on IV antibiotics due to pneumonia  Significant Hospital Events   6/14 admitted   6/16 PCCM consulted   6/23 Failed attempts at extubation x2 due to upper airway stridor.   7/4 - TRACHEOSTOMY  7/11 - pall care meeting - full code. Patient feels less congested, ventilatory setting was adjusted to see if she can be liberated from ventilator, avoiding tissue hypoxemia    7/12 - afebrile since 7/8. + 22L since admit. RN feels patient volume overloaded.Does SBT dailly of varying duration few to several hours and  then gets fatigued with tachypnea and tachycardia and agitation. Left sided dense hemiplegia continues. Periodically pruposeful on right side PRecedex continues but unsure if patient needs it/ Diarrhea + - on docusate   7/13 - On PSV via trach. Still unable to come off precedex gtt. ? Diarrhea btter after stopping docusate. No labs as part of blood conservation. Got lasix yesterday. Last dose zosyn today. Diarrhea + but improved after dc laxative. Needed prn for high BP last night. Trach sutures removed   7/13 - Off precedex since last night. Awake on ATC. Following commands on right slowly. Dense hemiplegia on left. Volume:  down to +17.3 L positive/ TF reduced from 60 -> 30cc yesterday due to ddiarrha and then got hypoglycemic. D10w started - > diarrhea better.   Significant Diagnostic Tests:   6/14 cerebral angiogram s/p RT ICA cer/petrous junction balloon angioplasty for severe stenosis.   6/15 MRA/ MRI brain  > Scattered small acute infarcts along the right MCA/watershed territory. Solitary small acute infarct in the left parietal cortex and right cerebellum. Improved right ICA patency at the petrous segment. Known severe right M1 segment stenosis. 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  6/15 CT a/p wo contrast > Mild to moderate amount of retroperitoneal hematoma is noted in the right iliac and inguinal regions. Moderate size hematoma is noted in the soft tissues anterior to the musculature of the right hip. No definite intraperitoneal hemorrhage is noted. Moderate size fat containing periumbilical hernia. Aortic Atherosclerosis.   Echo 6/16 > normal LVEF  6/29 MRI brain > Progressed size and confluence of ischemia throughout much of the  right hemisphere white matter since 08/26/2019. Associated petechial blood products but no malignant hemorrhagic transformation or mass Effect. There are also multiple new lacunar type infarcts elsewhere, including the brainstem and  the left basal ganglia. Underlying chronic infarcts of the right basal ganglia and left Cerebellum.  Micro Data:  6/10 SARS 2 >> neg 6/14 MRSA PCR >> neg 6/20 trach asp >> few diptheroids/ corynebacterium species 6/18 BC x 2 >> neg 7/6 Morganella  Antimicrobials:  6/14 cefazolin pre-op  6/20 unasyn >> 09/02/2019 7/6 vancomycin >> 7/8 7/6 Zosyn>> 7/12 (Morganella)     Assessment & Plan:   Principal Problem:   Internal carotid artery stenosis, right Active Problems:   Essential hypertension   Hyperlipidemia   Cerebral infarction ([place])   Type 2 diabetes mellitus with vascular disease ([place])   AKI (acute kidney injury) ([place])   Anemia   Acute respiratory failure (HCC)   Acute blood loss anemia   Palliative care by specialist   Goals of care, counseling/discussion   Hypoxia   Sepsis with acute hypoxic respiratory failure without septic shock (HCC)   Hypokalemia   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 Off precedex. Dense hemiplegia on left. Follows command on right. Continue current nutrition through cortrak. Avoid anticoagulation or antiplatelet given high risk of bleeding. Continue PT OT and SLP.  Acute metabolic encephalopathy: Looks comfortable but too weak to have any sort of conversation. Unable to assess orientation. - cotninue seroquel at 186m QHS (has helped come off precedex) - check QTc prn - clonidine taper to off per pharmacy protocol - end date 09/27/19 - - fent prn  -oxycodone prn  Acute on chronic respiratory failure with hypoxia requiring mechanical ventilation.  -Failed trial of extubation due to upper airway stridor.  Cords visibly scarred on reintubation. S/P trach 7/4. Still positive fluid balance since admission and has +1-2 bilateral pitting edema and some bilateral lower extremity edema. Was getting intermittent  Lasix doses on daily basis. We will give her a dose of 40 mg IV Lasix today.  Volume Overload with hypertension: She is still positive fluid balance since admission. She was getting Lasix doses intermittently based on her daily assessment. She still has significant bilateral upper extremity edema, +1 to +2 and some lower extremity edema. We will give her a dose of Lasix 40 mg IV today. Blood pressure elevated. Goal to keep SBP<160. Will increase hydralazine from 50 mg to 100 mg 3 times daily.  Acute blood loss anemia  -> Anemia of critical illness : - Jehovah's Witness, no blood products including albumin. Hemoglobin improved to 6.8 today. Continue severe anemia protocol: Iron, Folic acid, vitamin BD62 Monitor off  Epogen and Vit C that was previously given Limit blood draws to every 4-7 days as needed -  Expect slow recovery  Diarrhea: Resolved. Continue to feedings.  Uncontrolled diabetes with hyperglycemia: Blood sugar controlled on current regimen.  DVT prophylaxis: SCD.   Code Status: Full Code  Family Communication: None present at bedside.   Status is: Inpatient  Remains inpatient appropriate because:Unsafe d/c plan   Dispo: The patient is from: Home              Anticipated d/c is to: LTAC              Anticipated d/c date is: 2 days  Patient currently is medically stable to d/c.        Estimated body mass index is 37.86 kg/m as calculated from the following:   Height as of this encounter: 5' 1" (1.549 m).   Weight as of this encounter: 90.9 kg.      Nutritional status:  Nutrition Problem: Inadequate oral intake Etiology: inability to eat   Signs/Symptoms: NPO status   Interventions: Prostat, Tube feeding    Consultants:   NIR     Antimicrobials:  Anti-infectives (From admission, onward)   Start     Dose/Rate Route Frequency Ordered Stop   09/16/19 2130  vancomycin (VANCOCIN) IVPB 1000 mg/200 mL premix  Status:  Discontinued         1,000 mg 200 mL/hr over 60 Minutes Intravenous Every 12 hours 09/16/19 0839 09/18/19 0756   09/16/19 1630  piperacillin-tazobactam (ZOSYN) IVPB 3.375 g        3.375 g 12.5 mL/hr over 240 Minutes Intravenous Every 8 hours 09/16/19 0835 09/23/19 1153   09/16/19 1000  ceFEPIme (MAXIPIME) 1 g in sodium chloride 0.9 % 100 mL IVPB  Status:  Discontinued        1 g 200 mL/hr over 30 Minutes Intravenous Every 12 hours 09/16/19 0756 09/16/19 0803   09/16/19 0845  piperacillin-tazobactam (ZOSYN) IVPB 3.375 g        3.375 g 100 mL/hr over 30 Minutes Intravenous  Once 09/16/19 0834 09/16/19 1007   09/16/19 0845  vancomycin (VANCOREADY) IVPB 1500 mg/300 mL        1,500 mg 150 mL/hr over 120 Minutes Intravenous  Once 09/16/19 0839 09/16/19 1248   09/16/19 0830  piperacillin-tazobactam (ZOSYN) IVPB 3.375 g  Status:  Discontinued        3.375 g 12.5 mL/hr over 240 Minutes Intravenous Every 8 hours 09/16/19 0803 09/16/19 0834   09/16/19 0800  vancomycin (VANCOCIN) IVPB 1000 mg/200 mL premix  Status:  Discontinued        1,000 mg 200 mL/hr over 60 Minutes Intravenous Every 12 hours 09/16/19 0756 09/16/19 0839   08/31/19 1400  Ampicillin-Sulbactam (UNASYN) 3 g in sodium chloride 0.9 % 100 mL IVPB  Status:  Discontinued        3 g 200 mL/hr over 30 Minutes Intravenous Every 6 hours 08/31/19 1357 09/02/19 0819   08/25/19 0723  ceFAZolin (ANCEF) IVPB 2g/100 mL premix        2 g 200 mL/hr over 30 Minutes Intravenous 60 min pre-op 08/25/19 0723 08/25/19 0930         Subjective: Seen and examined. Patient is too lethargic to hold any conversation but she is alert. Following commands on the right side. Hemiplegic on the left side.  Objective: Vitals:   09/26/19 0828 09/26/19 0855 09/26/19 1120 09/26/19 1204  BP: (!) 156/64   (!) 161/70  Pulse: (!) 101 (!) 107 86 (!) 103  Resp: (!) 22 (!) 21 15 (!) 23  Temp: 98.6 F (37 C)   99.1 F (37.3 C)  TempSrc: Oral   Axillary  SpO2: 99% 99% 100% 99%   Weight:      Height:        Intake/Output Summary (Last 24 hours) at 09/26/2019 1311 Last data filed at 09/26/2019 0400 Gross per 24 hour  Intake 282.66 ml  Output 2100 ml  Net -1817.34 ml   Filed Weights   09/22/19 0500 09/23/19 0700 09/25/19 0500  Weight: 95.3 kg 88.7 kg 90.9 kg    Examination:  General  exam: Appears calm and comfortable  Respiratory system: Clear to auscultation. Respiratory effort normal. Cardiovascular system: S1 & S2 heard, RRR. No JVD, murmurs, rubs, gallops or clicks. No pedal edema. Gastrointestinal system: Abdomen is nondistended, soft and nontender. No organomegaly or masses felt. Normal bowel sounds heard. Central nervous system: Alert but unable to assess orientation since she is unable to talk much. Left hemiplegia.    Data Reviewed: I have personally reviewed following labs and imaging studies  CBC: Recent Labs  Lab 09/20/19 0500 09/26/19 0257  WBC 10.4 11.3*  HGB 4.7* 6.8*  HCT 17.5* 22.5*  MCV 110.1* 101.8*  PLT 447* 696   Basic Metabolic Panel: Recent Labs  Lab 09/20/19 0500 09/21/19 0500 09/26/19 0537  NA 144 142 141  K 3.1* 3.3* 3.8  CL 104 103 106  CO2 _0 GLUCOSE 181* 145* 141*  BUN 47* 41* 17  CREATININE 0.97 0.81 0.58  CALCIUM 8.5* 8.6* 8.7*  MG  --   --  1.7  PHOS  --   --  3.1   GFR: Estimated Creatinine Clearance: 69.1 mL/min (by C-G formula based on SCr of 0.58 mg/dL). Liver Function Tests: Recent Labs  Lab 09/20/19 0500 09/21/19 0500 09/26/19 0537  AST _1 ALT 29 36 42  ALKPHOS 95 81 57  BILITOT 0.7 0.3 0.5  PROT 7.4 7.1 5.5*  ALBUMIN 1.5* 1.6* 1.8*   No results for input(s): LIPASE, AMYLASE in the last 168 hours. No results for input(s): AMMONIA in the last 168 hours. Coagulation Profile: No results for input(s): INR, PROTIME in the last 168 hours. Cardiac Enzymes: No results for input(s): CKTOTAL, CKMB, CKMBINDEX, TROPONINI in the last 168 hours. BNP (last 3 results) No results  for input(s): PROBNP in the last 8760 hours. HbA1C: No results for input(s): HGBA1C in the last 72 hours. CBG: Recent Labs  Lab 09/25/19 1951 09/26/19 0043 09/26/19 0354 09/26/19 0826 09/26/19 1209  GLUCAP 130* 159* 166* 135* 146*   Lipid Profile: No results for input(s): CHOL, HDL, LDLCALC, TRIG, CHOLHDL, LDLDIRECT in the last 72 hours. Thyroid Function Tests: No results for input(s): TSH, T4TOTAL, FREET4, T3FREE, THYROIDAB in the last 72 hours. Anemia Panel: No results for input(s): VITAMINB12, FOLATE, FERRITIN, TIBC, IRON, RETICCTPCT in the last 72 hours. Sepsis Labs: No results for input(s): PROCALCITON, LATICACIDVEN in the last 168 hours.  Recent Results (from the past 240 hour(s))  MRSA PCR Screening     Status: None   Collection Time: 09/25/19  5:24 AM   Specimen: Nasopharyngeal  Result Value Ref Range Status   MRSA by PCR NEGATIVE NEGATIVE Final    Comment:        The GeneXpert MRSA Assay (FDA approved for NASAL specimens only), is one component of a comprehensive MRSA colonization surveillance program. It is not intended to diagnose MRSA infection nor to guide or monitor treatment for MRSA infections. Performed at [facility], [place] [address] [place], [place] [address]       Radiology Studies: No results found.  Scheduled Meds: . amLODipine  10 mg Per Tube Daily  . atorvastatin  40 mg Per NG tube Daily  . chlorhexidine gluconate (MEDLINE KIT)  15 mL Mouth Rinse BID  . Chlorhexidine Gluconate Cloth  6 each Topical Daily  . cloNIDine  0.1 mg Per Tube Q12H   Followed by  . [START ON 09/27/2019] cloNIDine  0.2 mg Per Tube Q24H  . famotidine  20 mg Per Tube  BID  . feeding supplement (PROSource TF)  45 mL Per Tube TID  . ferrous sulfate  220 mg Per Tube TID WC  . hydrALAZINE  100 mg Per Tube Q8H  . insulin aspart  0-15 Units Subcutaneous Q4H  . insulin aspart  4 Units Subcutaneous Q4H  . insulin glargine  16 Units Subcutaneous Daily  . mouth rinse   15 mL Mouth Rinse 10 times per day  . QUEtiapine  100 mg Per Tube QHS  . sodium chloride flush  10-40 mL Intracatheter Q12H   Continuous Infusions: . sodium chloride 10 mL/hr at 09/25/19 1800  . feeding supplement (JEVITY 1.2 CAL) 50 mL/hr at 09/25/19 1800     LOS: 32 days   Time spent: 40 minutes   Darliss Cheney, MD [facility]  09/26/2019, 1:11 PM   To contact the attending provider between 7A-7P or the covering provider during after hours 7P-7A, please log into the web site [email or web address].

## 2019-09-26 NOTE — TOC Progression Note (Signed)
Transition of Care [facility]) - Progression Note    Patient Details  Name: Donna [name] MRN: [id number] Date of Birth: [birth year]  Transition of Care [facility]) CM/SW Contact  Jimmy Picket, [place] Phone Number: 09/26/2019, 3:48 PM  Clinical Narrative:     CSW reached out to [facility] and Tammy at [facility] to inquire about insurance auth. CSW will continue to follow.   Expected Discharge Plan: Long Term Acute Care (LTAC) Barriers to Discharge: Continued Medical Work up  Expected Discharge Plan and Services Expected Discharge Plan: Long Term Acute Care (LTAC)                                               Social Determinants of Health (SDOH) Interventions    Readmission Risk Interventions No flowsheet data found.  Jimmy Picket, Theresia Majors, [place] Clinical Social Worker [phone]

## 2019-09-26 NOTE — Progress Notes (Signed)
Referring Physician(s): Melvenia Beam  Supervising Physician: Luanne Bras  Patient Status:  [facility] - In-pt  Chief Complaint: None-tracheostomy without sedation  Subjective:  Right ICA cervical/petrous junction stenosis s/p revascularization using balloon angioplasty via right radial and right femoral approach6/14/2021by Dr. Estanislado Pandy. Mild-moderate right thigh (with some retroperitoneal component) hematoma. Acute blood loss anemia, Jehovah's Witness. Patient awake laying in bed, tracheostomy in place, no sedation.Shefollows simple commands and is alert today. Was transferred out of ICU to 2W yesterday. Follows commands with right side and minimal flicker of LUE to command, demonstrates neglect of LLE. Right groin and right radial puncture sites c/d/i. Most recenthgbwas 6.8 today (up from4.7 on 09/20/2019). No family at bedside this AM.   Allergies: Patient has no known allergies.  Medications: Prior to Admission medications   Medication Sig Start Date End Date Taking? Authorizing Provider  amLODipine (NORVASC) 10 MG tablet Take 1 tablet (10 mg total) by mouth daily. 05/12/19  Yes Minette Brine, FNP  aspirin EC 81 MG tablet Take 81 mg by mouth every evening.   Yes [provider]  Calcium Carbonate-Vitamin D (CALCIUM-D PO) Take 2 tablets by mouth daily at 12 noon.    Yes [provider]  carvedilol (COREG) 6.25 MG tablet TAKE 1 TABLET(6.25 MG) BY MOUTH TWICE DAILY Patient taking differently: Take 6.25 mg by mouth 2 (two) times daily with a meal.  07/31/19  Yes Minette Brine, FNP  hydrALAZINE (APRESOLINE) 50 MG tablet Take 1 tablet (50 mg total) by mouth 2 (two) times daily. 06/16/19 09/14/19 Yes Elouise Munroe, MD  Insulin Degludec-Liraglutide (XULTOPHY) 100-3.6 UNIT-MG/ML SOPN Inject 30 Units into the skin daily. 08/14/19  Yes Minette Brine, FNP  telmisartan-hydrochlorothiazide (MICARDIS HCT) 40-12.5 MG tablet Take 1 tablet by mouth daily. 07/31/19   Yes Minette Brine, FNP  ticagrelor (BRILINTA) 90 MG TABS tablet Take 1 tablet (90 mg total) by mouth 2 (two) times daily. 06/12/19  Yes Elouise Munroe, MD  vitamin C (ASCORBIC ACID) 250 MG tablet Take 500 mg by mouth daily at 12 noon.   Yes [provider]  blood glucose meter kit and supplies KIT Dispense based on patient and insurance preference. Use up to four times daily as directed. (FOR ICD-9 250.00, 250.01). 05/10/18   Minette Brine, FNP  Blood Glucose Monitoring Suppl (TRUE METRIX METER) w/Device KIT 1 each by Does not apply route in the morning, at noon, in the evening, and at bedtime. 05/22/19   Minette Brine, FNP  Evolocumab (REPATHA SURECLICK) 466 MG/ML SOAJ Inject 140 mg into the skin every 14 (fourteen) days. 06/30/19   Elouise Munroe, MD  glucose blood (TRUE METRIX BLOOD GLUCOSE TEST) test strip Check blood sugar 4 times a day before meals and bedtime 05/22/19   Minette Brine, FNP  Insulin Glargine-Lixisenatide (SOLIQUA) 100-33 UNT-MCG/ML SOPN Inject 25 Units into the skin daily. Patient taking differently: Inject 30 Units into the skin daily.  07/14/19   Minette Brine, FNP  Multiple Vitamin (MULTIVITAMIN PO) Take 1 tablet by mouth daily at 12 noon.     [provider]  Omega-3 Fatty Acids (OMEGA-3 PLUS PO) Take 2 capsules by mouth daily at 12 noon.    [provider]     Vital Signs: BP (!) 156/64 (BP Location: Left Arm)   Pulse (!) 107   Temp 98.6 F (37 C) (Oral)   Resp (!) 21   Ht 5' 1"  (1.549 m)   Wt 200 lb 6.4 oz (90.9 kg)  SpO2 99%   BMI 37.86 kg/m   Physical Exam Vitals and nursing note reviewed.  Constitutional:      General: She is not in acute distress.    Comments: Tracheostomy without sedation.  Pulmonary:     Effort: Pulmonary effort is normal. No respiratory distress.     Comments: Tracheostomy without sedation. Skin:    General: Skin is warm and dry.     Comments: Gross anasarca. Right groin puncture site soft without  active bleeding or hematoma. Right radial puncture site soft without active bleeding or hematoma.   Neurological:     Mental Status: She is alert.     Comments: Tracheostomy without sedation. Alert today, nods to yes/no questions appropriately, follows simple commands of right side with minimal flicker of LUE to command. PERRL bilaterally. Follows commands with right side (wiggles right toes and bends right knee, grips with right hand and bends right elbow) and demonstrates minimal flicker of LUE to command, demonstrates neglect of LLE. Distal pulses (DPs) 1+ bilaterally and (radial) 2+ bilaterally.      Imaging: DG CHEST PORT 1 VIEW  Result Date: 09/22/2019 CLINICAL DATA:  PICC placement. EXAM: PORTABLE CHEST 1 VIEW COMPARISON:  September 16, 2019. FINDINGS: Stable cardiomediastinal silhouette. Tracheostomy and feeding tubes are unchanged in position. No pneumothorax or pleural effusion is noted. Stable bilateral lung opacities are noted concerning for possible inflammation. Right-sided PICC line is again noted with distal tip in expected position of the SVC. Bony thorax is unremarkable. IMPRESSION: Stable support apparatus. Stable bilateral lung opacities are noted concerning for possible inflammation. No pneumothorax is noted. Aortic Atherosclerosis (ICD10-I70.0). Electronically Signed   By: Marijo Conception M.D.   On: 09/22/2019 12:49    Labs:  CBC: Recent Labs    09/17/19 0703 09/18/19 0923 09/20/19 0500 09/26/19 0257  WBC 17.5* 15.3* 10.4 11.3*  HGB 5.6* 4.7* 4.7* 6.8*  HCT 19.0* 16.4* 17.5* 22.5*  PLT 448* 435* 447* 247    COAGS: Recent Labs    11/27/18 0728 08/18/19 0705 08/26/19 1715 08/27/19 0230  INR 0.9 0.9 1.1 1.2    BMP: Recent Labs    09/18/19 0923 09/20/19 0500 09/21/19 0500 09/26/19 0537  NA 142 144 142 141  K 4.4 3.1* 3.3* 3.8  CL 103 104 103 106  CO2 27 28 30 30   GLUCOSE 151* 181* 145* 141*  BUN 42* 47* 41* 17  CALCIUM 8.7* 8.5* 8.6* 8.7*   CREATININE 1.01* 0.97 0.81 0.58  GFRNONAA 57* >60 >60 >60  GFRAA >60 >60 >60 >60    LIVER FUNCTION TESTS: Recent Labs    09/17/19 0703 09/20/19 0500 09/21/19 0500 09/26/19 0537  BILITOT 1.1 0.7 0.3 0.5  AST 32 27 31 31   ALT 23 29 36 42  ALKPHOS 82 95 81 57  PROT 6.6 7.4 7.1 5.5*  ALBUMIN 1.6* 1.5* 1.6* 1.8*    Assessment and Plan:  Right ICA cervical/petrous junction stenosis s/p revascularization using balloon angioplasty via right radial and right femoral approach6/14/2021by Dr. Estanislado Pandy. Mild-moderate right thigh (with some retroperitoneal component) hematoma. Acute blood loss anemia, Jehovah's Witness. Patient's condition slightly improved-tracheostomy withoutsedation, alert today, follows simple commands of right side with minimal flicker of LUE to command, demonstrates neglect of LLE. Right groinand right radial puncture sitesstable, no signs of active bleeding. Hgb 6.8 today-receiving supplementation per severe anemia protocol. For possible transfer to [facility], family has selected [facility] at this time, awaiting insurance authorization. Continue to hold Brilinta/Aspirin/Lovenox.Agree with CCM plans toavoid excess  blood drawl. Further plans per CCM/neurology/palliative care- appreciate and agree with management. NIR to follow.   Electronically Signed: Earley Abide, PA-C 09/26/2019, 9:31 AM   I spent a total of 25 Minutes at the the patient's bedside AND on the patient's hospital floor or unit, greater than 50% of which was counseling/coordinating care for right ICA stenosis s/p revascularization.

## 2019-09-27 LAB — GLUCOSE, CAPILLARY
Glucose-Capillary: 151 mg/dL — ABNORMAL HIGH (ref 70–99)
Glucose-Capillary: 157 mg/dL — ABNORMAL HIGH (ref 70–99)
Glucose-Capillary: 145 mg/dL — ABNORMAL HIGH (ref 70–99)
Glucose-Capillary: 189 mg/dL — ABNORMAL HIGH (ref 70–99)
Glucose-Capillary: 203 mg/dL — ABNORMAL HIGH (ref 70–99)

## 2019-09-27 MED ORDER — POTASSIUM CHLORIDE 20 MEQ PO PACK
40.0000 meq | PACK | Freq: Once | ORAL | Status: AC
  Administered 2019-09-27: 40 meq
  Filled 2019-09-27: qty 2

## 2019-09-27 MED ORDER — POTASSIUM CHLORIDE CRYS ER 20 MEQ PO TBCR: 40.0000 meq | EXTENDED_RELEASE_TABLET | Freq: Once | ORAL | Status: DC

## 2019-09-27 MED ORDER — FUROSEMIDE 10 MG/ML IJ SOLN
40.0000 mg | Freq: Once | INTRAMUSCULAR | Status: AC
  Administered 2019-09-27: 40 mg via INTRAVENOUS
  Filled 2019-09-27: qty 4

## 2019-09-27 NOTE — Progress Notes (Signed)
Progress Note    Donna [name]  SWF:[id number] DOB: [birth year]  DOA: 08/25/2019 PCP: Minette Brine, FNP    Brief Narrative:   Medical records reviewed and are as summarized below:  Donna [name] is an 69 y.o. 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  Admitted by Neuro IR on 6/14 for cerebral angiogram s/p RT ICA cervical/ petrous junction balloon angioplasty for severe stenosis via right femoral approach. Was placed on ASA/ brillinta. Monitored in Neuro ICU. Noted to have developed hypotension, increased left sided weakness and lethargy on 6/15 am with Hgb drop from 13.3 to 6.9 with new AKI. Post MRI/ MRA showed scattered small acute infarcts along the right MCA/ watershed territory suspected to be resultant either post procedure vs hypoperfusion.   A CT abd/ pelvis was obtained given concern for retroperitoneal bleed which showed a mild to moderate amount of retroperitoneal hematoma in the right iliac and inguinal regiones with moderate size hematoma noted the the soft tissues anterior to the musculature of the right hip; no definite intrapertioneal hemorrhage. INR, PT, and platelets rechecked and were wnl. Vascular surgery was consulted with plans to observe given bleeding was mostly into the muscle and tissue.  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 Off precedex. Dense hemiplegia on left.  - Continue current nutrition through cortrak. Avoid anticoagulation or antiplatelet given high risk of bleeding -plan for LTAC?  Acute metabolic encephalopathy: Looks comfortable but too weak to have any sort of conversation. Unable to assess orientation. - cotninue seroquel at 15m QHS (has helped come off precedex) - check QTc prn - clonidine taper to off per pharmacy protocol - end date 09/27/19 (could have some rebound HTN)  -oxycodone prn  Acute on chronic respiratory failure with hypoxia requiring mechanical ventilation.   -Failed trial of extubation due to upper airway stridor.  Cords visibly scarred on reintubation. S/P trach 7/4. Still positive fluid balance since admission and has +1-2 bilateral pitting edema and some bilateral lower extremity edema. Was getting intermittent Lasix doses   Volume Overload with hypertension:  -She has been getting Lasix doses intermittently based on her daily assessment. She still has significant bilateral upper extremity edema, +1 to +2 and some lower extremity edema.  -dose of Lasix 40 mg IV today.  - Goal to keep SBP<160  Acute blood loss anemia  -> Anemia of critical illness : - Jehovah's Witness, no blood products including albumin. Hemoglobin improved to 6.8 today. Continue severe anemia protocol: Iron, Folic acid, vitamin BU20 Monitor off  Epogen and Vit C that was previously given Limit blood draws to every 4-7 days as needed  Expect slow recovery  Diarrhea:  -most likely due to tube feedings.  Uncontrolled diabetes with hyperglycemia:  -Blood sugar controlled on current regimen.  obesity Body mass index is 37.86  kg/m.  Nutrition Status: Nutrition Problem: Inadequate oral intake Etiology: inability to eat Signs/Symptoms: NPO status Interventions: Prostat, Tube feeding   Family Communication/Anticipated D/C date and plan/Code Status    Code Status: Full Code.  Disposition Plan: Status is: Inpatient  Remains inpatient appropriate because:Unsafe d/c plan   Dispo: The patient is from: Home              Anticipated d/c is to: LTAC              Anticipated d/c date is: 1 day              Patient currently is medically stable to d/c. to [facility]:    IR  PCCM  Neurology  Vascular  Palliative care  hematology  Subjective:   Not verbal  Objective:    Vitals:   09/27/19 0445 09/27/19 0635 09/27/19 0743 09/27/19 0832  BP:  (!) 165/64 (!) 179/80   Pulse: (!) 104  (!) 117 (!) 111  Resp:   (!) 25 (!) 22  Temp:   98.7 F (37.1 C)   TempSrc:   Axillary   SpO2:   100%   Weight:      Height:        Intake/Output Summary (Last 24 hours) at 09/27/2019 1110 Last data filed at 09/26/2019 2313 Gross per 24 hour  Intake --  Output 4500 ml  Net -4500 ml   Filed Weights   09/22/19 0500 09/23/19 0700 09/25/19 0500  Weight: 95.3 kg 88.7 kg 90.9 kg    Exam:  General: Appearance:    Obese female in no acute distress     Lungs:      respirations unlabored, trach  Heart:    Tachycardic. Normal rhythm. No murmurs, rubs, or gallops.   MS:   All extremities are intact.   Neurologic:   Awake, alert, does not move left     Data Reviewed:   I have personally reviewed following labs and imaging studies:  Labs: Labs show the following:   Basic Metabolic Panel: Recent Labs  Lab 09/21/19 0500 09/26/19 0537  NA 142 141  K 3.3* 3.8  CL 103 106  CO2 30 30  GLUCOSE 145* 141*  BUN 41* 17  CREATININE 0.81 0.58  CALCIUM 8.6* 8.7*  MG  --  1.7  PHOS  --  3.1   GFR Estimated Creatinine Clearance: 69.1 mL/min (by C-G formula based on SCr of 0.58  mg/dL). Liver Function Tests: Recent Labs  Lab 09/21/19 0500 09/26/19 0537  AST 31 31  ALT 36 42  ALKPHOS 81 57  BILITOT 0.3 0.5  PROT 7.1 5.5*  ALBUMIN 1.6* 1.8*   No results for input(s): LIPASE, AMYLASE in the last 168 hours. No results for input(s): AMMONIA in the last 168 hours. Coagulation profile No results for input(s): INR, PROTIME in the last 168 hours.  CBC: Recent Labs  Lab 09/26/19 0257  WBC 11.3*  HGB 6.8*  HCT 22.5*  MCV 101.8*  PLT 247   Cardiac Enzymes: No results for input(s): CKTOTAL, CKMB, CKMBINDEX, TROPONINI in the last 168 hours. BNP (last 3 results) No results for input(s): PROBNP in the last 8760 hours. CBG: Recent Labs  Lab 09/26/19 1549 09/26/19 1941 09/26/19 2356 09/27/19 0355 09/27/19  0743  GLUCAP 158* 104* 141* 151* 157*   D-Dimer: No results for input(s): DDIMER in the last 72 hours. Hgb A1c: No results for input(s): HGBA1C in the last 72 hours. Lipid Profile: No results for input(s): CHOL, HDL, LDLCALC, TRIG, CHOLHDL, LDLDIRECT in the last 72 hours. Thyroid function studies: No results for input(s): TSH, T4TOTAL, T3FREE, THYROIDAB in the last 72 hours.  Invalid input(s): FREET3 Anemia work up: No results for input(s): VITAMINB12, FOLATE, FERRITIN, TIBC, IRON, RETICCTPCT in the last 72 hours. Sepsis Labs: Recent Labs  Lab 09/26/19 0257  WBC 11.3*    Microbiology Recent Results (from the past 240 hour(s))  MRSA PCR Screening     Status: None   Collection Time: 09/25/19  5:24 AM   Specimen: Nasopharyngeal  Result Value Ref Range Status   MRSA by PCR NEGATIVE NEGATIVE Final    Comment:        The GeneXpert MRSA Assay (FDA approved for NASAL specimens only), is one component of a comprehensive MRSA colonization surveillance program. It is not intended to diagnose MRSA infection nor to guide or monitor treatment for MRSA infections. Performed at [facility], [place] [address], [place], [place] [address]      Procedures and diagnostic studies:  No results found.  Medications:   . amLODipine  10 mg Per Tube Daily  . atorvastatin  40 mg Per NG tube Daily  . chlorhexidine gluconate (MEDLINE KIT)  15 mL Mouth Rinse BID  . Chlorhexidine Gluconate Cloth  6 each Topical Daily  . famotidine  20 mg Per Tube BID  . feeding supplement (PROSource TF)  45 mL Per Tube TID  . ferrous sulfate  220 mg Per Tube TID WC  . hydrALAZINE  100 mg Per Tube Q8H  . insulin aspart  0-15 Units Subcutaneous Q4H  . insulin aspart  4 Units Subcutaneous Q4H  . insulin glargine  16 Units Subcutaneous Daily  . mouth rinse  15 mL Mouth Rinse 10 times per day  . QUEtiapine  100 mg Per Tube QHS  . sodium chloride flush  10-40 mL Intracatheter Q12H   Continuous Infusions: . sodium chloride 10 mL/hr at 09/25/19 1800  . feeding supplement (JEVITY 1.2 CAL) 50 mL/hr at 09/25/19 1800     LOS: 33 days   Geradine Girt  [facility]   How to contact the [facility] Attending or Consulting provider [place] or covering provider during after hours [place], for this patient?  1. Check the care team in [facility] and look for a) attending/consulting [facility] provider listed and b) the [facility] team listed 2. Log into [email or web address] and use [place]'s universal password to access. If you do not have the password, please contact the hospital operator. 3. Locate the [facility] provider you are looking for under [facility] and page to a number that you can be directly reached. 4. If you still have difficulty reaching the provider, please page the [facility] (Director on Call) for the Hospitalists listed on amion for assistance.  09/27/2019, 11:10 AM

## 2019-09-27 NOTE — Plan of Care (Signed)
  Problem: Education: Goal: Knowledge of General Education information will improve Description: Including pain rating scale, medication(s)/side effects and non-pharmacologic comfort measures Outcome: Progressing   Problem: Health Behavior/Discharge Planning: Goal: Ability to manage health-related needs will improve Outcome: Progressing   Problem: Clinical Measurements: Goal: Ability to maintain clinical measurements within normal limits will improve Outcome: Progressing Goal: Will remain free from infection Outcome: Progressing Goal: Diagnostic test results will improve Outcome: Progressing Goal: Respiratory complications will improve Outcome: Progressing Goal: Cardiovascular complication will be avoided Outcome: Progressing   Problem: Activity: Goal: Risk for activity intolerance will decrease Outcome: Progressing   Problem: Coping: Goal: Level of anxiety will decrease Outcome: Progressing   Problem: Elimination: Goal: Will not experience complications related to bowel motility Outcome: Progressing Goal: Will not experience complications related to urinary retention Outcome: Progressing   Problem: Pain Managment: Goal: General experience of comfort will improve Outcome: Progressing   Problem: Safety: Goal: Ability to remain free from injury will improve Outcome: Progressing   Problem: Skin Integrity: Goal: Risk for impaired skin integrity will decrease Outcome: Progressing   Problem: Cardiovascular: Goal: Vascular access site(s) Level 0-1 will be maintained Outcome: Progressing   Problem: Education: Goal: Knowledge of disease or condition will improve Outcome: Progressing Goal: Knowledge of secondary prevention will improve Outcome: Progressing Goal: Knowledge of patient specific risk factors addressed and post discharge goals established will improve Outcome: Progressing   Problem: Health Behavior/Discharge Planning: Goal: Ability to manage  health-related needs will improve Outcome: Progressing   Problem: Self-Care: Goal: Ability to participate in self-care as condition permits will improve Outcome: Progressing   Problem: Ischemic Stroke/TIA Tissue Perfusion: Goal: Complications of ischemic stroke/TIA will be minimized Outcome: Progressing   Problem: Activity: Goal: Ability to tolerate increased activity will improve Outcome: Progressing   Problem: Respiratory: Goal: Ability to maintain a clear airway and adequate ventilation will improve Outcome: Progressing   Problem: Role Relationship: Goal: Method of communication will improve Outcome: Progressing   

## 2019-09-28 LAB — GLUCOSE, CAPILLARY
Glucose-Capillary: 146 mg/dL — ABNORMAL HIGH (ref 70–99)
Glucose-Capillary: 164 mg/dL — ABNORMAL HIGH (ref 70–99)
Glucose-Capillary: 173 mg/dL — ABNORMAL HIGH (ref 70–99)
Glucose-Capillary: 194 mg/dL — ABNORMAL HIGH (ref 70–99)
Glucose-Capillary: 194 mg/dL — ABNORMAL HIGH (ref 70–99)
Glucose-Capillary: 194 mg/dL — ABNORMAL HIGH (ref 70–99)

## 2019-09-28 MED ORDER — HYDRALAZINE HCL 50 MG PO TABS
100.0000 mg | ORAL_TABLET | Freq: Four times a day (QID) | ORAL | Status: DC
  Administered 2019-09-28 – 2019-11-01 (×124): 100 mg
  Filled 2019-09-28 (×125): qty 2

## 2019-09-28 NOTE — Progress Notes (Signed)
Progress Note    Donna [name]  VOH:[id number] DOB: 1950/09/12  DOA: 08/25/2019 PCP: Minette Brine, FNP    Brief Narrative:   Medical records reviewed and are as summarized below:  Donna [name] is an 69 y.o. 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  Admitted by Neuro IR on 6/14 for cerebral angiogram s/p RT ICA cervical/ petrous junction balloon angioplasty for severe stenosis via right femoral approach. Was placed on ASA/ brillinta. Monitored in Neuro ICU. Noted to have developed hypotension, increased left sided weakness and lethargy on 6/15 am with Hgb drop from 13.3 to 6.9 with new AKI. Post MRI/ MRA showed scattered small acute infarcts along the right MCA/ watershed territory suspected to be resultant either post procedure vs hypoperfusion.   A CT abd/ pelvis was obtained given concern for retroperitoneal bleed which showed a mild to moderate amount of retroperitoneal hematoma in the right iliac and inguinal regiones with moderate size hematoma noted the the soft tissues anterior to the musculature of the right hip; no definite intrapertioneal hemorrhage. INR, PT, and platelets rechecked and were wnl. Vascular surgery was consulted with plans to observe given bleeding was mostly into the muscle and tissue.  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 Off precedex. Dense hemiplegia on left.  - Continue current nutrition through cortrak. Avoid anticoagulation or antiplatelet given high risk of bleeding -plan for LTAC?  Acute metabolic encephalopathy: Looks comfortable but too weak to have any sort of conversation. Unable to assess orientation. - cotninue seroquel at 11m QHS (has helped come off precedex) - check QTc prn - clonidine taper to off per pharmacy protocol - end date 09/27/19 (could have some rebound HTN)  -oxycodone prn  Acute on chronic respiratory failure with hypoxia requiring mechanical ventilation.   -Failed trial of extubation due to upper airway stridor.  Cords visibly scarred on reintubation. S/P trach 7/4. -getting intermittent Lasix doses   Volume Overload with hypertension:  -She has been getting Lasix doses intermittently based on her daily assessment.  -last dose of Lasix 40 mg IV on 7/17  - Goal to keep SBP<160  Acute blood loss anemia  -> Anemia of critical illness : - Jehovah's Witness, no blood products including albumin. Hemoglobin improved to 6.8 today. Continue severe anemia protocol: Iron, Folic acid, vitamin BI94 Monitor off  Epogen and Vit C that was previously given Limit blood draws to every 4-7 days as needed  Expect slow recovery  Diarrhea:  -most likely due to tube feedings.  Uncontrolled diabetes with hyperglycemia:  -Blood sugar controlled on current regimen.  obesity Body mass index is 37.86 kg/m.  Nutrition Status: Nutrition  Problem: Inadequate oral intake Etiology: inability to eat Signs/Symptoms: NPO status Interventions: Prostat, Tube feeding   Family Communication/Anticipated D/C date and  plan/Code Status    Code Status: Full Code.  Disposition Plan: Status is: Inpatient  Remains inpatient appropriate because:Unsafe d/c plan   Dispo: The patient is from: Home              Anticipated d/c is to: LTAC              Anticipated d/c date is: 1 day              Patient currently is medically stable to d/c. to [facility]:    IR  PCCM  Neurology  Vascular  Palliative care  hematology  Subjective:   No overnight issues  Objective:    Vitals:   09/28/19 0018 09/28/19 0324 09/28/19 0827 09/28/19 0948  BP: (!) 157/64 (!) 146/75  (!) 170/83  Pulse: (!) 102 100 (!) 121   Resp: 17 20 (!) 24   Temp: 98.8 F (37.1 C) 98.2 F (36.8 C)  98.5 F (36.9 C)  TempSrc: Axillary Axillary  Oral  SpO2: 100% 100% 100%   Weight:      Height:        Intake/Output Summary (Last 24 hours) at 09/28/2019 1244 Last data filed at 09/27/2019 2127 Gross per 24 hour  Intake 600 ml  Output 2000 ml  Net -1400 ml   Filed Weights   09/22/19 0500 09/23/19 0700 09/25/19 0500  Weight: 95.3 kg 88.7 kg 90.9 kg    Exam:  General: Appearance:    Obese female in no acute distress     Lungs:     respirations unlabored  Heart:    Tachycardic.   MS:    Neurologic:   Awake, alert, weak on the left, non-verbal.      Data Reviewed:   I have personally reviewed following labs and imaging studies:  Labs: Labs show the following:   Basic Metabolic Panel: Recent Labs  Lab 09/26/19 0537  NA 141  K 3.8  CL 106  CO2 30  GLUCOSE 141*  BUN 17  CREATININE 0.58  CALCIUM 8.7*  MG 1.7  PHOS 3.1   GFR Estimated Creatinine Clearance: 69.1 mL/min (by C-G formula based on SCr of 0.58 mg/dL). Liver Function Tests: Recent Labs  Lab 09/26/19 0537  AST 31  ALT 42  ALKPHOS 57  BILITOT 0.5  PROT 5.5*  ALBUMIN 1.8*   No results for input(s): LIPASE, AMYLASE in the last 168 hours. No results for input(s): AMMONIA in the last 168 hours. Coagulation  profile No results for input(s): INR, PROTIME in the last 168 hours.  CBC: Recent Labs  Lab 09/26/19 0257  WBC 11.3*  HGB 6.8*  HCT 22.5*  MCV 101.8*  PLT 247   Cardiac Enzymes: No results for input(s): CKTOTAL, CKMB, CKMBINDEX, TROPONINI in the last 168 hours. BNP (last 3 results) No results for input(s): PROBNP in the last 8760 hours. CBG: Recent Labs  Lab 09/27/19 2052 09/28/19 0016 09/28/19 0331 09/28/19 0811 09/28/19 1116  GLUCAP 203* 164* 146* 194* 194*   D-Dimer: No results for input(s): DDIMER in the last 72 hours. Hgb A1c: No results for input(s): HGBA1C in the last 72 hours. Lipid Profile: No results for input(s): CHOL, HDL, LDLCALC, TRIG, CHOLHDL, LDLDIRECT in the last 72 hours. Thyroid function studies: No results for input(s): TSH, T4TOTAL, T3FREE, THYROIDAB  in the last 72 hours.  Invalid input(s): FREET3 Anemia work up: No results for input(s): VITAMINB12, FOLATE, FERRITIN, TIBC, IRON, RETICCTPCT in the last 72 hours. Sepsis Labs: Recent Labs  Lab 09/26/19 0257  WBC 11.3*    Microbiology Recent Results (from the past 240 hour(s))  MRSA PCR Screening     Status: None   Collection Time: 09/25/19  5:24 AM   Specimen: Nasopharyngeal  Result Value Ref Range Status   MRSA by PCR NEGATIVE NEGATIVE Final    Comment:        The GeneXpert MRSA Assay (FDA approved for NASAL specimens only), is one component of a comprehensive MRSA colonization surveillance program. It is not intended to diagnose MRSA infection nor to guide or monitor treatment for MRSA infections. Performed at [facility], [place] [address], [place], [place] [address]     Procedures and diagnostic studies:  No results found.  Medications:   . amLODipine  10 mg Per Tube Daily  . atorvastatin  40 mg Per NG tube Daily  . chlorhexidine gluconate (MEDLINE KIT)  15 mL Mouth Rinse BID  . Chlorhexidine Gluconate Cloth  6 each Topical Daily  . famotidine  20 mg Per Tube BID   . feeding supplement (PROSource TF)  45 mL Per Tube TID  . ferrous sulfate  220 mg Per Tube TID WC  . hydrALAZINE  100 mg Per Tube Q8H  . insulin aspart  0-15 Units Subcutaneous Q4H  . insulin aspart  4 Units Subcutaneous Q4H  . insulin glargine  16 Units Subcutaneous Daily  . mouth rinse  15 mL Mouth Rinse 10 times per day  . QUEtiapine  100 mg Per Tube QHS  . sodium chloride flush  10-40 mL Intracatheter Q12H   Continuous Infusions: . sodium chloride 10 mL/hr at 09/25/19 1800  . feeding supplement (JEVITY 1.2 CAL) 1,000 mL (09/28/19 1157)     LOS: 34 days   Geradine Girt  [facility]   How to contact the [facility] Attending or Consulting provider [place] or covering provider during after hours [place], for this patient?  1. Check the care team in [facility] and look for a) attending/consulting TRH provider listed and b) the [facility] team listed 2. Log into [email or web address] and use [place] universal password to access. If you do not have the password, please contact the hospital operator. 3. Locate the [facility] provider you are looking for under [facility] and page to a number that you can be directly reached. 4. If you still have difficulty reaching the provider, please page the [facility] (Director on Call) for the Hospitalists listed on amion for assistance.  09/28/2019, 12:44 PM

## 2019-09-29 LAB — GLUCOSE, CAPILLARY
Glucose-Capillary: 145 mg/dL — ABNORMAL HIGH (ref 70–99)
Glucose-Capillary: 165 mg/dL — ABNORMAL HIGH (ref 70–99)
Glucose-Capillary: 188 mg/dL — ABNORMAL HIGH (ref 70–99)
Glucose-Capillary: 198 mg/dL — ABNORMAL HIGH (ref 70–99)
Glucose-Capillary: 201 mg/dL — ABNORMAL HIGH (ref 70–99)
Glucose-Capillary: 202 mg/dL — ABNORMAL HIGH (ref 70–99)

## 2019-09-29 MED ORDER — WHITE PETROLATUM EX OINT
TOPICAL_OINTMENT | CUTANEOUS | Status: AC
  Filled 2019-09-29: qty 28.35

## 2019-09-29 NOTE — Progress Notes (Addendum)
Progress Note    Donna [name]  RRN:[id number] DOB: May 05, 1950  DOA: 08/25/2019 PCP: Minette Brine, FNP    Brief Narrative:   Medical records reviewed and are as summarized below:  Donna [name] is an 69 y.o. 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  Admitted by Neuro IR on 6/14 for cerebral angiogram s/p RT ICA cervical/ petrous junction balloon angioplasty for severe stenosis via right femoral approach. Was placed on ASA/ brillinta. Monitored in Neuro ICU. Noted to have developed hypotension, increased left sided weakness and lethargy on 6/15 am with Hgb drop from 13.3 to 6.9 with new AKI. Post MRI/ MRA showed scattered small acute infarcts along the right MCA/ watershed territory suspected to be resultant either post procedure vs hypoperfusion.   A CT abd/ pelvis was obtained given concern for retroperitoneal bleed which showed a mild to moderate amount of retroperitoneal hematoma in the right iliac and inguinal regiones with moderate size hematoma noted the the soft tissues anterior to the musculature of the right hip; no definite intrapertioneal hemorrhage. INR, PT, and platelets rechecked and were wnl. Vascular surgery was consulted with plans to observe given bleeding was mostly into the muscle and tissue.  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 Off precedex. Dense hemiplegia on left.  - Continue current nutrition through cortrak. Avoid anticoagulation or antiplatelet given high risk of bleeding -plan for LTAC-- await insurance approval  Acute metabolic encephalopathy: Looks comfortable but too weak to have any sort of conversation. Unable to assess orientation. - cotninue seroquel at 149m QHS (has helped come off precedex) - check QTc prn - clonidine taper to off per pharmacy protocol - end date 09/27/19 (could have some rebound HTN)  Acute on chronic respiratory failure with hypoxia requiring mechanical ventilation.   -Failed trial of extubation due to upper airway stridor.  Cords visibly scarred on reintubation. S/P trach 7/4. -getting intermittent Lasix doses   Volume Overload with hypertension:  -She has been getting Lasix doses intermittently based on her daily assessment.  -last dose of Lasix 40 mg IV on 7/17  - Goal to keep SBP<160  Acute blood loss anemia  -> Anemia of critical illness : - Jehovah's Witness, no blood products including albumin. Hemoglobin improved to 6.8 today. Continue severe anemia protocol: Iron, Folic acid, vitamin BF38 Monitor off  Epogen and Vit C that was previously given Limit blood draws to every 4-7 days as needed  Expect slow recovery  Diarrhea:  -most likely due to tube feedings.  Uncontrolled diabetes with hyperglycemia:  -Blood sugar controlled on current regimen.  obesity Body mass index is 35.82 kg/m.  Nutrition Status: Nutrition  Problem: Inadequate oral intake Etiology: inability to eat Signs/Symptoms: NPO status Interventions: Prostat, Tube feeding   Family Communication/Anticipated D/C date and  plan/Code Status    Code Status: Full Code.  Disposition Plan: Status is: Inpatient Spoke with patient's sister Joaquim Lai-- I have some concerns about her being able to make decisions for patient as she repeated questions several times and asked what LTAC she was going to (the sister had already chosen [facility])  Remains inpatient appropriate because:Unsafe d/c plan   Dispo: The patient is from: Home              Anticipated d/c is to: LTAC              Anticipated d/c date is: when bed available              Patient currently is medically stable to d/c. to [facility]:    IR  PCCM  Neurology  Vascular  Palliative care  hematology  Subjective:   Appears comfortable- no overnight events  Objective:    Vitals:   09/29/19 0800 09/29/19 0827 09/29/19 1047 09/29/19 1219  BP: 137/72 137/72 (!) 154/66   Pulse: (!) 117 (!) 117 (!) 115 (!) 110  Resp: 17 16 18 18   Temp: 98.2 F (36.8 C)  98.4 F (36.9 C)   TempSrc: Axillary     SpO2: 100%  100%   Weight:      Height:        Intake/Output Summary (Last 24 hours) at 09/29/2019 1228 Last data filed at 09/29/2019 0981 Gross per 24 hour  Intake 2100 ml  Output 1300 ml  Net 800 ml   Filed Weights   09/23/19 0700 09/25/19 0500 09/29/19 0500  Weight: 88.7 kg 90.9 kg 86 kg    Exam:  General: Appearance:    Obese female in bed, watching TV     Lungs:     Trach, respirations unlabored  Heart:    Tachycardic. Normal rhythm. No murmurs, rubs, or gallops.      Neurologic: Awake and alert, left sided neglect      Data Reviewed:   I have personally reviewed following labs and imaging studies:  Labs: Labs show the following:   Basic Metabolic Panel: Recent Labs  Lab 09/26/19 0537  NA 141  K 3.8  CL 106  CO2 30  GLUCOSE 141*  BUN 17  CREATININE 0.58  CALCIUM 8.7*  MG 1.7  PHOS 3.1   GFR Estimated Creatinine Clearance: 67 mL/min (by C-G formula based on SCr of 0.58  mg/dL). Liver Function Tests: Recent Labs  Lab 09/26/19 0537  AST 31  ALT 42  ALKPHOS 57  BILITOT 0.5  PROT 5.5*  ALBUMIN 1.8*   No results for input(s): LIPASE, AMYLASE in the last 168 hours. No results for input(s): AMMONIA in the last 168 hours. Coagulation profile No results for input(s): INR, PROTIME in the last 168 hours.  CBC: Recent Labs  Lab 09/26/19 0257  WBC 11.3*  HGB 6.8*  HCT 22.5*  MCV 101.8*  PLT 247   Cardiac Enzymes: No results for input(s): CKTOTAL, CKMB, CKMBINDEX, TROPONINI in the last 168 hours. BNP (last 3 results) No results for input(s): PROBNP in the last 8760 hours. CBG: Recent Labs  Lab 09/28/19 1709 09/28/19 2008 09/29/19 0004 09/29/19 0433 09/29/19 0758  GLUCAP 194* 173* 145* 198* 201*   D-Dimer: No results for input(s): DDIMER in the last 72  hours. Hgb A1c: No results for input(s): HGBA1C in the last 72 hours. Lipid Profile: No results for input(s): CHOL, HDL, LDLCALC, TRIG, CHOLHDL, LDLDIRECT in the last 72 hours. Thyroid function studies: No results for input(s): TSH, T4TOTAL, T3FREE, THYROIDAB in the last 72 hours.  Invalid input(s): FREET3 Anemia work up: No results for input(s): VITAMINB12, FOLATE, FERRITIN, TIBC, IRON, RETICCTPCT in the last 72 hours. Sepsis Labs: Recent Labs  Lab 09/26/19 0257  WBC 11.3*    Microbiology Recent Results (from the past 240 hour(s))  MRSA PCR Screening     Status: None   Collection Time: 09/25/19  5:24 AM   Specimen: Nasopharyngeal  Result Value Ref Range Status   MRSA by PCR NEGATIVE NEGATIVE Final    Comment:        The GeneXpert MRSA Assay (FDA approved for NASAL specimens only), is one component of a comprehensive MRSA colonization surveillance program. It is not intended to diagnose MRSA infection nor to guide or monitor treatment for MRSA infections. Performed at [facility], [place] [address] [place], [place] [address]     Procedures and diagnostic  studies:  No results found.  Medications:   . amLODipine  10 mg Per Tube Daily  . atorvastatin  40 mg Per NG tube Daily  . chlorhexidine gluconate (MEDLINE KIT)  15 mL Mouth Rinse BID  . Chlorhexidine Gluconate Cloth  6 each Topical Daily  . famotidine  20 mg Per Tube BID  . feeding supplement (PROSource TF)  45 mL Per Tube TID  . ferrous sulfate  220 mg Per Tube TID WC  . hydrALAZINE  100 mg Per Tube Q6H  . insulin aspart  0-15 Units Subcutaneous Q4H  . insulin aspart  4 Units Subcutaneous Q4H  . insulin glargine  16 Units Subcutaneous Daily  . mouth rinse  15 mL Mouth Rinse 10 times per day  . QUEtiapine  100 mg Per Tube QHS  . sodium chloride flush  10-40 mL Intracatheter Q12H   Continuous Infusions: . sodium chloride 10 mL/hr at 09/25/19 1800  . feeding supplement (JEVITY 1.2 CAL) 1,000 mL (09/28/19 1157)     LOS: 35 days   Geradine Girt  [facility]   How to contact the [facility] Attending or Consulting provider [place] or covering provider during after hours [place], for this patient?  1. Check the care team in [facility] and look for a) attending/consulting TRH provider listed and b) the [facility] team listed 2. Log into [email or web address] and use [place]'s universal password to access. If you do not have the password, please contact the hospital operator. 3. Locate the [facility] provider you are looking for under [facility] and page to a number that you can be directly reached. 4. If you still have difficulty reaching the provider, please page the [facility] (Director on Call) for the Hospitalists listed on amion for assistance.  09/29/2019, 12:28 PM

## 2019-09-29 NOTE — Progress Notes (Signed)
Referring Physician(s): Melvenia Beam  Supervising Physician: Luanne Bras  Patient Status:  [facility] - In-pt  Chief Complaint: None - tracheostomy without sedation   Subjective:   Right ICA cervical/petrous junction stenosis s/p revascularization using balloon angioplasty via right radial and right femoral approach6/14/2021by Dr. Estanislado Pandy. Mild-moderate right thigh (with some retroperitoneal component) hematoma. Acute blood loss anemia, Jehovah's Witness.  Patient is awake in bed, appears to be watching TV, no family at bedside. Does not appear to be in any discomfort or distress. Tracheostomy in place, no sedation. She does follow simple commands with the right side, demonstrates neglect of left side.    Allergies: Patient has no known allergies.  Medications: Prior to Admission medications   Medication Sig Start Date End Date Taking? Authorizing Provider  amLODipine (NORVASC) 10 MG tablet Take 1 tablet (10 mg total) by mouth daily. 05/12/19  Yes Minette Brine, FNP  aspirin EC 81 MG tablet Take 81 mg by mouth every evening.   Yes [provider]  Calcium Carbonate-Vitamin D (CALCIUM-D PO) Take 2 tablets by mouth daily at 12 noon.    Yes [provider]  carvedilol (COREG) 6.25 MG tablet TAKE 1 TABLET(6.25 MG) BY MOUTH TWICE DAILY Patient taking differently: Take 6.25 mg by mouth 2 (two) times daily with a meal.  07/31/19  Yes Minette Brine, FNP  hydrALAZINE (APRESOLINE) 50 MG tablet Take 1 tablet (50 mg total) by mouth 2 (two) times daily. 06/16/19 09/14/19 Yes Elouise Munroe, MD  Insulin Degludec-Liraglutide (XULTOPHY) 100-3.6 UNIT-MG/ML SOPN Inject 30 Units into the skin daily. 08/14/19  Yes Minette Brine, FNP  telmisartan-hydrochlorothiazide (MICARDIS HCT) 40-12.5 MG tablet Take 1 tablet by mouth daily. 07/31/19  Yes Minette Brine, FNP  ticagrelor (BRILINTA) 90 MG TABS tablet Take 1 tablet (90 mg total) by mouth 2 (two) times daily. 06/12/19  Yes Elouise Munroe, MD  vitamin C (ASCORBIC ACID) 250 MG tablet Take 500 mg by mouth daily at 12 noon.   Yes [provider]  blood glucose meter kit and supplies KIT Dispense based on patient and insurance preference. Use up to four times daily as directed. (FOR ICD-9 250.00, 250.01). 05/10/18   Minette Brine, FNP  Blood Glucose Monitoring Suppl (TRUE METRIX METER) w/Device KIT 1 each by Does not apply route in the morning, at noon, in the evening, and at bedtime. 05/22/19   Minette Brine, FNP  Evolocumab (REPATHA SURECLICK) 431 MG/ML SOAJ Inject 140 mg into the skin every 14 (fourteen) days. 06/30/19   Elouise Munroe, MD  glucose blood (TRUE METRIX BLOOD GLUCOSE TEST) test strip Check blood sugar 4 times a day before meals and bedtime 05/22/19   Minette Brine, FNP  Insulin Glargine-Lixisenatide (SOLIQUA) 100-33 UNT-MCG/ML SOPN Inject 25 Units into the skin daily. Patient taking differently: Inject 30 Units into the skin daily.  07/14/19   Minette Brine, FNP  Multiple Vitamin (MULTIVITAMIN PO) Take 1 tablet by mouth daily at 12 noon.     [provider]  Omega-3 Fatty Acids (OMEGA-3 PLUS PO) Take 2 capsules by mouth daily at 12 noon.    [provider]     Vital Signs: BP 137/72   Pulse (!) 117   Temp 98.2 F (36.8 C) (Axillary)   Resp 16   Ht 5' 1"  (1.549 m)   Wt 189 lb 9.5 oz (86 kg)   SpO2 100%   BMI 35.82 kg/m   Physical Exam Constitutional:      General:  She is not in acute distress.    Comments: Tracheostomy (trach collar).   HENT:     Mouth/Throat:     Mouth: Mucous membranes are dry.  Eyes:     Pupils: Pupils are equal, round, and reactive to light.  Pulmonary:     Effort: Pulmonary effort is normal.     Comments: Tracheostomy (trach collar); no sedation Skin:    General: Skin is warm and dry.     Comments: Anasarca. Right groin puncture site soft without active bleeding or hematoma. Right radial puncture site soft without active bleeding or hematoma.    Neurological:     Mental Status: She is alert.     Comments: Alert today, nods to yes/no questions appropriately, follows simple commands of right side. PERRL bilaterally. Follows commands with right side (wiggles right toes, grips with right hand and lifts RUE above head), demonstrates neglect of left side.     Imaging: No results found.  Labs:  CBC: Recent Labs    09/17/19 0703 09/18/19 0923 09/20/19 0500 09/26/19 0257  WBC 17.5* 15.3* 10.4 11.3*  HGB 5.6* 4.7* 4.7* 6.8*  HCT 19.0* 16.4* 17.5* 22.5*  PLT 448* 435* 447* 247    COAGS: Recent Labs    11/27/18 0728 08/18/19 0705 08/26/19 1715 08/27/19 0230  INR 0.9 0.9 1.1 1.2    BMP: Recent Labs    09/18/19 0923 09/20/19 0500 09/21/19 0500 09/26/19 0537  NA 142 144 142 141  K 4.4 3.1* 3.3* 3.8  CL 103 104 103 106  CO2 27 28 30 30   GLUCOSE 151* 181* 145* 141*  BUN 42* 47* 41* 17  CALCIUM 8.7* 8.5* 8.6* 8.7*  CREATININE 1.01* 0.97 0.81 0.58  GFRNONAA 57* >60 >60 >60  GFRAA >60 >60 >60 >60    LIVER FUNCTION TESTS: Recent Labs    09/17/19 0703 09/20/19 0500 09/21/19 0500 09/26/19 0537  BILITOT 1.1 0.7 0.3 0.5  AST 32 27 31 31   ALT 23 29 36 42  ALKPHOS 82 95 81 57  PROT 6.6 7.4 7.1 5.5*  ALBUMIN 1.6* 1.5* 1.6* 1.8*    Assessment and Plan:  Right ICA cervical/petrous junction stenosis s/p revascularization using balloon angioplasty via right radial and right femoral approach6/14/2021by Dr. Estanislado Pandy. Mild-moderate right thigh (with some retroperitoneal component) hematoma. Acute blood loss anemia, Jehovah's Witness.  Patient's condition slightly improved-tracheostomy withoutsedation, alert today, follows simple commands of right side, demonstrates neglect of left side. Right groinand right radial puncture sitesstable, no signs of active bleeding. Last CBC on 09/26/19 showed a Hgb of 6.8. She is receiving iron supplementation per severe anemia protocol.  For possible transfer to [facility],  family has selected [facility] at this time. Insurance authorization was declined and an appeal of this denial is in progress.   Continue to hold Brilinta/Aspirin/Lovenox.Agree with CCM plans toavoid excess blood draw.  Further plans per CCM/neurology/palliative care- appreciate and agree with management.  NIR to follow.   Electronically Signed: Soyla Dryer, AGACNP-BC [phone] 09/29/2019, 10:30 AM   I spent a total of 25 Minutes at the the patient's bedside AND on the patient's hospital floor or unit, greater than 50% of which was counseling/coordinating care for right ICA stenosis s/p revascularization.

## 2019-09-29 NOTE — Progress Notes (Signed)
Department Leadership went in to have conversation with the sister and son of the patient to address concerns related to patient care. Nursing leader discuss plan of care with details regarding therapy orders, nursing care, and discharge planning. Leader also spoke with attending prior to conversation with the family. Family requesting to understand the process with insurance authorization. Leader will facilitate conversation between that team and the family tomorrow. Family seems to be satisfied with the conversation.

## 2019-09-29 NOTE — Progress Notes (Signed)
NAME:  Donna [name], MRN:  [id number], DOB:  Aug 02, 1950, LOS: 35 ADMISSION DATE:  08/25/2019, CONSULTATION DATE:  08/27/2019 REFERRING MD:  Dr. 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  Admitted by Neuro IR on 6/14 for cerebral angiogram s/p RT ICA cervical/ petrous junction balloon angioplasty for severe stenosis via right femoral approach.  Was placed on ASA/ brillinta.  Monitored in Neuro ICU.  Noted to have developed hypotension, increased left sided weakness and lethargy on 6/15 am with Hgb drop from 13.3 to 6.9 with new AKI.  Post MRI/ MRA showed scattered small acute infarcts along the right MCA/ watershed territory suspected to be resultant either post procedure vs hypoperfusion.    A CT abd/ pelvis was obtained given concern for retroperitoneal bleed which showed a mild to moderate amount of retroperitoneal hematoma in the right iliac and inguinal regiones with moderate size hematoma noted the the soft tissues anterior to the musculature of the right hip; no definite intrapertioneal hemorrhage. INR, PT, and platelets rechecked and were wnl.  Vascular surgery was consulted with plans to observe given bleeding was mostly into the muscle and tissue.  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   7/4 - TRACHEOSTOMY  7/11 - pall  care meeting - full code. Patient feels less congested, ventilatory setting was adjusted to see if she can be liberated from ventilator, avoiding tissue hypoxemia  7/12 - afebrile since 7/8. + 22L since admit. RN feels patient volume overloaded.Does SBT dailly of varying duration few to several hours and then gets fatigued with tachypnea and tachycardia and agitation. Left sided dense hemiplegia continues. Periodically pruposeful on right side PRecedex continues but unsure if patient needs it/ Diarrhea + - on docusate  7/13 - On PSV via trach. Still unable to come off precedex gtt. ? Diarrhea btter after stopping docusate. No labs as part of blood conservation. Got lasix yesterday. Last dose zosyn today. Diarrhea + but improved after dc laxative. Needed prn for high BP last night. Trach sutures removed  7/13 - Off precedex since last night. Awake on ATC. Following commands on right slowly. Dense hemiplegia on left. Volume:  down to +17.3 L positive/ TF reduced from 60 -> 30cc yesterday due to ddiarrha and then got hypoglycemic. D10w started - > diarrhea better.    Consults:  NIR - primary Neurology (stroke has signed off 6/24) Vascular surgery  PCCM Palliative Care Hematology  Procedures:   ETT 6/19 > 6/23,  6/23 > Trach 7/4  Significant Diagnostic Tests:   6/14 cerebral angiogram s/p RT ICA cer/petrous junction balloon angioplasty for severe stenosis.   6/15 MRA/ MRI brain  > Scattered small acute infarcts along the right MCA/watershed territory. Solitary small acute infarct in the left parietal cortex and right cerebellum. Improved right ICA patency at the petrous segment. Known severe right M1 segment stenosis. 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  6/15 CT a/p wo contrast > Mild to moderate amount of retroperitoneal hematoma is noted in the right iliac and inguinal regions. Moderate size hematoma is noted in the soft tissues anterior to the musculature of the right hip.  No definite intraperitoneal hemorrhage is noted. Moderate size fat containing periumbilical hernia. Aortic Atherosclerosis.   Echo 6/16 > normal LVEF  6/29 MRI brain > Progressed size and confluence of ischemia throughout much of the right hemisphere white matter since 08/26/2019. Associated petechial blood products but no malignant hemorrhagic transformation or mass Effect. There are also multiple new lacunar type infarcts elsewhere, including the brainstem and the left basal ganglia. Underlying chronic infarcts of the right basal ganglia and left Cerebellum.   Micro Data:  6/10 SARS 2 >> neg 6/14 MRSA PCR >> neg 6/20 trach asp >> few diptheroids/ corynebacterium species 6/18 BC x 2 >> neg 7/6 Morganella  Antimicrobials:  6/14 cefazolin pre-op  6/20 unasyn >> 09/02/2019 7/6 vancomycin >> 7/8 7/6 Zosyn>> 7/12 (Morganella)  Interim history/subjective:  Lying in bed in no acute distress, patient's sister is at bedside and reports she looks worse on on her evaluation today.  She states "her face isn't clean and she is less interactive today"   Objective   Blood pressure (!) 154/66, pulse (!) 110, temperature 98.4 F (36.9 C), resp. rate 18, height 5\' 1"  (1.549 m), weight 86 kg, SpO2 100 %.    FiO2 (%):  [28 %] 28 %   Intake/Output Summary (Last 24 hours) at 09/29/2019 1418 Last data filed at 09/29/2019 0616 Gross per 24 hour  Intake 2100 ml  Output 1300 ml  Net 800 ml   Filed Weights   09/23/19 0700 09/25/19 0500 09/29/19 0500  Weight: 88.7 kg 90.9 kg 86 kg    Examination:  General: Chronically ill appearing elderly very deconditioned middle-aged female lying in bed on ATC in no acute distress  HEENT: 6 cuffed Shiley trach midline, MM pink/moist, PERRL, sclera nonicteric, lips dry with white sloughing skin seen, core track in place Neuro: Awake and alert with inability to follow any commands, appears to track to voice CV: s1s2 regular rate and rhythm, no murmur, rubs, or  gallops,  PULM: Faint rhonchi bilaterally, tolerating tracheal secretions well, strong cough, oxygen saturations 100% on 28 FiO2 through ATC GI: soft, bowel sounds active in all 4 quadrants, non-tender, non-distended, tolerating TF Extremities: warm/dry, no edema  Skin: no rashes or lesions  Resolved Hospital Problem list   Hypotension  AKI Sepsis VAP - Morganella s/p zosyn ending 09/23/19  Assessment & Plan:   Right MCA CVA due to bilateral intracranial stenoses  -s/p RT ICA cer/petrous junction balloon angioplasty for severe stenosis c/b right thigh and retroperitoneal hematoma.  Acute encephalopathy -Continues to slowly show improvement P: Continue to mobilize as able PT/OT evaluations Nutrition and bowel regiment Seizure precautions Secondary stroke prevention Unable to anticoagulate or give antiplatelets given high risk for rebleed Continue statin therapy Delirium precautions Encourage sleep-wake cycle Continue scheduled Seroquel, as needed oxycodone  Acute on chronic respiratory failure with hypoxia requiring mechanical ventilation.   -Failed trial of extubation due to upper airway stridor. Cords visibly scarred on reintubation.  -S/P trach 7/4 P: Continues to tolerate ATC well Tolerating tracheal secretions well Encourage pulmonary hygiene Routine trach care Mobilize as able Discharge disposition pending  Other acute versus chronic medical conditions including: Volume Overload with hypertension Acute blood loss anemia superimposed on anemia of chronic illness Diarrhea Uncontrolled diabetes with hyperglycemia Now being  managed by primary team, [facility]  PCCM will continue to follow for routine trach management once weekly  Please call if additional assistance is needed   Best practice  Diet: Tube feeds DVT prophylaxis: Lovenox GI prophylaxis: Protonix Mobility: PT/OT CODE STATUS: Full code Disposition: Floor Family updated: Per primary  LABS     PULMONARY No results for input(s): PHART, PCO2ART, PO2ART, HCO3, TCO2, O2SAT in the last 168 hours.  Invalid input(s): PCO2, PO2  CBC Recent Labs  Lab 09/26/19 0257  HGB 6.8*  HCT 22.5*  WBC 11.3*  PLT 247    COAGULATION No results for input(s): INR in the last 168 hours.  CARDIAC  No results for input(s): TROPONINI in the last 168 hours. No results for input(s): PROBNP in the last 168 hours.   CHEMISTRY Recent Labs  Lab 09/26/19 0537  NA 141  K 3.8  CL 106  CO2 30  GLUCOSE 141*  BUN 17  CREATININE 0.58  CALCIUM 8.7*  MG 1.7  PHOS 3.1   Estimated Creatinine Clearance: 67 mL/min (by C-G formula based on SCr of 0.58 mg/dL).   LIVER Recent Labs  Lab 09/26/19 0537  AST 31  ALT 42  ALKPHOS 57  BILITOT 0.5  PROT 5.5*  ALBUMIN 1.8*     INFECTIOUS No results for input(s): LATICACIDVEN, PROCALCITON in the last 168 hours.   ENDOCRINE CBG (last 3)  Recent Labs    09/29/19 0433 09/29/19 0758 09/29/19 1240  GLUCAP 198* 201* 165*    IMAGING x48h  - image(s) personally visualized  -   highlighted in bold No results found.   Signature:  Delfin Gant, NP-C [facility] Contact / Pager information can be found on Amion  09/29/2019, 2:59 PM  '

## 2019-09-29 NOTE — TOC Progression Note (Addendum)
Transition of Care [facility]) - Progression Note    Patient Details  Name: AERABELLA GALASSO MRN: [id number] Date of Birth: January 31, 1951  Transition of Care [facility]) CM/SW Contact  Nonda Lou, [place] Phone Number: 09/29/2019, 8:36 AM  Clinical Narrative:    1:20p CSW received expedited appeal documentation and had it signed by MD. Arneta Cliche [phone] appeal to [facility], as requested by Irving Burton with [facility]. CSW updated Irving Burton of successful fax transmission. Irving Burton stated she will keep track of the appeal and follow-up with CSW Osborne Casco if it returns Tuesday or Wednesday.  8:35a CSW contacted by Irving Burton with [facility] and informed patient's insurance authorization was declined and an expedited appeal needs to be completed. Donna Bernard CSW appeal documentation to be signed by MD and submitted.   Expected Discharge Plan: Long Term Acute Care (LTAC) Barriers to Discharge: Continued Medical Work up  Expected Discharge Plan and Services Expected Discharge Plan: Long Term Acute Care (LTAC)                                               Social Determinants of Health (SDOH) Interventions    Readmission Risk Interventions No flowsheet data found.

## 2019-09-30 LAB — GLUCOSE, CAPILLARY
Glucose-Capillary: 131 mg/dL — ABNORMAL HIGH (ref 70–99)
Glucose-Capillary: 143 mg/dL — ABNORMAL HIGH (ref 70–99)
Glucose-Capillary: 146 mg/dL — ABNORMAL HIGH (ref 70–99)
Glucose-Capillary: 162 mg/dL — ABNORMAL HIGH (ref 70–99)
Glucose-Capillary: 224 mg/dL — ABNORMAL HIGH (ref 70–99)
Glucose-Capillary: 225 mg/dL — ABNORMAL HIGH (ref 70–99)
Glucose-Capillary: 247 mg/dL — ABNORMAL HIGH (ref 70–99)
Glucose-Capillary: 260 mg/dL — ABNORMAL HIGH (ref 70–99)
Glucose-Capillary: 72 mg/dL (ref 70–99)

## 2019-09-30 MED ORDER — HYDROCERIN EX CREA
TOPICAL_CREAM | Freq: Three times a day (TID) | CUTANEOUS | Status: DC
  Administered 2019-09-30 – 2019-11-01 (×10): 1 via TOPICAL
  Filled 2019-09-30: qty 113

## 2019-09-30 NOTE — Progress Notes (Signed)
Pt has yellow MEWS score. Pt previously has yellow score. Doctors are aware. Will monitor.

## 2019-09-30 NOTE — Progress Notes (Signed)
  Speech Language Pathology Treatment: Dysphagia;Passy Muir Speaking valve  Patient Details Name: Donna [name] MRN: [id number] DOB: September 26, 1950 Today's Date: 09/30/2019 Time: 7494-4967 SLP Time Calculation (min) (ACUTE ONLY): 21 min  Assessment / Plan / Recommendation Clinical Impression  Pt's trach was changed just prior to SLP arriving to cuffless, continues with #6. She had difficulty mobilizing secretions and complete exhalation via upper airway. Audible secretions present moving into trach hub but could not expectorate completely. Valve either pushed off via air or removed by therapist due to increased work of breathing and intolerance subjectively. She was restless, grimacing even when valve removed appearing to be in pain and notified RN. Pt's sister present and educated re: ST goals and allowing pt more time to respond to questions. Pt is responding with mildly increased frequency but overall about 40% of the time. PO trials were not given as session focused on PMV/education. She likely did not tolerate valve as well this session despite cuffless likely due to recent trach change. Would continue use with ST until therapist determines safety.    HPI HPI: 69 y.o. female with PMH significant for HTN, HLD, CVA 06/2018, DM, anemia, obesity, OSA, and OA. Pt underwent R ICA angioplasty on 6/14. Pt with decline in neuro status and hemoglobin on 6/15, found to have a large R groin hematoma.  Pt has had Hbg 5.0 or less and is a Jehovah's witness. MRI showed scattered small acute infarcts along the right MCA/watershed territory. Solitary small acute infarct in the left parietal cortex and right cerebellum. She was intubated on 6/19. Pt underwent tracheostomy on 7/3. PT found to be septic with RLL PNA on 7/6.       SLP Plan  Continue with current plan of care       Recommendations         Patient may use Passy-Muir Speech Valve: with SLP only (given today's session -will update)         Oral  Care Recommendations: Oral care QID Follow up Recommendations: Skilled Nursing facility;LTACH;24 hour supervision/assistance SLP Visit Diagnosis: Dysphagia, unspecified (R13.10);Aphonia (R49.1) Plan: Continue with current plan of care       GO                Royce Macadamia 09/30/2019, 12:20 PM  Breck Coons Lonell Face.Ed Nurse, children's [phone] Office [phone]

## 2019-09-30 NOTE — Progress Notes (Signed)
PT Cancellation Note  Patient Details Name: Donna [name] MRN: [id number] DOB: 05-31-1950   Cancelled Treatment:    Reason Eval/Treat Not Completed: Medical issues which prohibited therapy - Pt with significantly increased work of breathing at rest, tachycardia to 120 bpm, diaphoresis, and restless/writhing UE movements at rest. PT and OT notified RN, will hold today.   Richrd Sox, PT Acute Rehabilitation Services Pager [phone]  Office [phone]    Tyrone Apple D Despina Hidden 09/30/2019, 2:57 PM

## 2019-09-30 NOTE — TOC Progression Note (Signed)
Transition of Care [facility]) - Progression Note    Patient Details  Name: Donna [name] MRN: [id number] Date of Birth: 1950-10-11  Transition of Care [facility]) CM/SW [place], LCSW Phone Number: 09/30/2019, 1:04 PM  Clinical Narrative:    CSW received request to speak with patient's sister. CSW met with sister at bedside and answered her questions about waiting on [facility] approval and next steps.    Expected Discharge Plan: Long Term Acute Care (LTAC) Barriers to Discharge: Insurance Authorization  Expected Discharge Plan and Services Expected Discharge Plan: Long Term Acute Care (LTAC)                                               Social Determinants of Health (SDOH) Interventions    Readmission Risk Interventions No flowsheet data found.

## 2019-09-30 NOTE — Progress Notes (Signed)
OT Cancellation Note  Patient Details Name: SATOMI BUDA MRN: [id number] DOB: [birth year]   Cancelled Treatment:    Reason Eval/Treat Not Completed: Medical issues which prohibited therapy On PT/OT entry, pt with labored breathing, HR 120bpm at rest and distressed appearance. RN notified and present. Holding therapy today. Will re-attempt session tomorrow.   Lorre Munroe 09/30/2019, 2:57 PM

## 2019-09-30 NOTE — Progress Notes (Signed)
Referring Physician(s): Melvenia Beam  Supervising Physician: Luanne Bras  Patient Status:  [facility] - In-pt  Chief Complaint: None- tracheostomy without sedation.   Subjective: Right ICA cervical/petrous junction stenosis s/p revascularization using balloon angioplasty via right radial and right femoral approach6/14/2021by Dr. Estanislado Pandy. Mild-moderate right thigh (with some retroperitoneal component) hematoma. Acute blood loss anemia, Jehovah's Witness.  Patient awake in bed, seems less interactive today compared to yesterday. No family at bedside. No discomfort or distress observed. Tracheostomy in place, no sedation. She was able to follow some commands on the right side. No movement on the left observed.    Allergies: Patient has no known allergies.  Medications: Prior to Admission medications   Medication Sig Start Date End Date Taking? Authorizing Provider  amLODipine (NORVASC) 10 MG tablet Take 1 tablet (10 mg total) by mouth daily. 05/12/19  Yes Minette Brine, FNP  aspirin EC 81 MG tablet Take 81 mg by mouth every evening.   Yes [provider]  Calcium Carbonate-Vitamin D (CALCIUM-D PO) Take 2 tablets by mouth daily at 12 noon.    Yes [provider]  carvedilol (COREG) 6.25 MG tablet TAKE 1 TABLET(6.25 MG) BY MOUTH TWICE DAILY Patient taking differently: Take 6.25 mg by mouth 2 (two) times daily with a meal.  07/31/19  Yes Minette Brine, FNP  hydrALAZINE (APRESOLINE) 50 MG tablet Take 1 tablet (50 mg total) by mouth 2 (two) times daily. 06/16/19 09/14/19 Yes Elouise Munroe, MD  Insulin Degludec-Liraglutide (XULTOPHY) 100-3.6 UNIT-MG/ML SOPN Inject 30 Units into the skin daily. 08/14/19  Yes Minette Brine, FNP  telmisartan-hydrochlorothiazide (MICARDIS HCT) 40-12.5 MG tablet Take 1 tablet by mouth daily. 07/31/19  Yes Minette Brine, FNP  ticagrelor (BRILINTA) 90 MG TABS tablet Take 1 tablet (90 mg total) by mouth 2 (two) times daily. 06/12/19  Yes  Elouise Munroe, MD  vitamin C (ASCORBIC ACID) 250 MG tablet Take 500 mg by mouth daily at 12 noon.   Yes [provider]  blood glucose meter kit and supplies KIT Dispense based on patient and insurance preference. Use up to four times daily as directed. (FOR ICD-9 250.00, 250.01). 05/10/18   Minette Brine, FNP  Blood Glucose Monitoring Suppl (TRUE METRIX METER) w/Device KIT 1 each by Does not apply route in the morning, at noon, in the evening, and at bedtime. 05/22/19   Minette Brine, FNP  Evolocumab (REPATHA SURECLICK) 122 MG/ML SOAJ Inject 140 mg into the skin every 14 (fourteen) days. 06/30/19   Elouise Munroe, MD  glucose blood (TRUE METRIX BLOOD GLUCOSE TEST) test strip Check blood sugar 4 times a day before meals and bedtime 05/22/19   Minette Brine, FNP  Insulin Glargine-Lixisenatide (SOLIQUA) 100-33 UNT-MCG/ML SOPN Inject 25 Units into the skin daily. Patient taking differently: Inject 30 Units into the skin daily.  07/14/19   Minette Brine, FNP  Multiple Vitamin (MULTIVITAMIN PO) Take 1 tablet by mouth daily at 12 noon.     [provider]  Omega-3 Fatty Acids (OMEGA-3 PLUS PO) Take 2 capsules by mouth daily at 12 noon.    [provider]     Vital Signs: BP (!) 161/68   Pulse (!) 118   Temp 99.8 F (37.7 C) (Axillary)   Resp (!) 24   Ht 5' 1" (1.549 m)   Wt 187 lb 6.3 oz (85 kg)   SpO2 100%   BMI 35.41 kg/m   Physical Exam Constitutional:      General: She is  not in acute distress.    Comments: Tracheostomy  HENT:     Mouth/Throat:     Mouth: Mucous membranes are moist.  Cardiovascular:     Rate and Rhythm: Normal rate and regular rhythm.     Comments: Patient on the monitor in the room.  Pulmonary:     Effort: Pulmonary effort is normal.     Comments: Tracheostomy. Skin:    General: Skin is warm and dry.     Comments: Right groin and right radial sites are clean and dry.   Neurological:     Mental Status: She is alert.     Comments:  Patient able to follow simple commands with multiple cues. She wiggled her toes on her right foot and was able to slightly lift right foot off the bed. She would not raise her right arm but when lifted she was able to hold it up for a few seconds. No movement observed on the left side. Patient able to look at Dr. Estanislado Pandy when he called her name.      Imaging: No results found.  Labs:  CBC: Recent Labs    09/17/19 0703 09/18/19 0923 09/20/19 0500 09/26/19 0257  WBC 17.5* 15.3* 10.4 11.3*  HGB 5.6* 4.7* 4.7* 6.8*  HCT 19.0* 16.4* 17.5* 22.5*  PLT 448* 435* 447* 247    COAGS: Recent Labs    11/27/18 0728 08/18/19 0705 08/26/19 1715 08/27/19 0230  INR 0.9 0.9 1.1 1.2    BMP: Recent Labs    09/18/19 0923 09/20/19 0500 09/21/19 0500 09/26/19 0537  NA 142 144 142 141  K 4.4 3.1* 3.3* 3.8  CL 103 104 103 106  CO2 _0 GLUCOSE 151* 181* 145* 141*  BUN 42* 47* 41* 17  CALCIUM 8.7* 8.5* 8.6* 8.7*  CREATININE 1.01* 0.97 0.81 0.58  GFRNONAA 57* >60 >60 >60  GFRAA >60 >60 >60 >60    LIVER FUNCTION TESTS: Recent Labs    09/17/19 0703 09/20/19 0500 09/21/19 0500 09/26/19 0537  BILITOT 1.1 0.7 0.3 0.5  AST 32 _1 ALT 23 29 36 42  ALKPHOS 82 95 81 57  PROT 6.6 7.4 7.1 5.5*  ALBUMIN 1.6* 1.5* 1.6* 1.8*    Assessment and Plan:   Right ICA cervical/petrous junction stenosis s/p revascularization using balloon angioplasty via right radial and right femoral approach6/14/2021by Dr. Estanislado Pandy. Mild-moderate right thigh (with some retroperitoneal component) hematoma. Acute blood loss anemia, Jehovah's Witness.  No significant neuro changes compared to yesterday. Patient seemed a little withdrawn today. Tracheostomy in place withoutsedation, follows simple commands of right side, demonstrates neglectof left side. Right groinand right radial puncture sitesstable, no signs of active bleeding. Last CBC on 09/26/19 showed a Hgbof 6.8. She is  receiving iron supplementation per severe anemia protocol.  For [facility] transfer to [facility], family has selected [facility] at this time. Insurance authorization was declined and an appeal of this denial is in progress.   Continue to hold Brilinta/Aspirin/Lovenox.Agree with CCM plans toavoid excess blood draw.  Further plans per CCM/neurology/palliative care- appreciate and agree with management.  NIR to follow.  Electronically Signed: Soyla Dryer, AGACNP-BC [phone] 09/30/2019, 10:05 AM   I spent a total of 15 Minutes at the the patient's bedside AND on the patient's hospital floor or unit, greater than 50% of which was counseling/coordinating care for right ICA stenosis s/p revascularization.

## 2019-09-30 NOTE — Progress Notes (Signed)
Nutrition Follow-up  DOCUMENTATION CODES:   Not applicable  INTERVENTION:   Continue tube feeding:  - Jevity 1.2 @ 50 ml/hr via Cortrak (1200 ml/day) - ProSource TF 45 ml TID  Tube feeding regimen provides 1560 kcal, 99 grams of protein, and 973 ml of H2O.  NUTRITION DIAGNOSIS:   Inadequate oral intake related to inability to eat as evidenced by NPO status.  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  6/14 - s/p balloon angioplasty for severe stenosis, pt developed lethargy and increased L sided weakness found to have new watershed infarcts, started on vasopressors for cerebral perfusion 6/16 - cortrak placed, tip in stomach 6/17 - pt with increased agitation requiring increased O2 6/18 - pt pulled out Cortrak, Cortrak replaced 6/19 - Cortrak removed 6/20 - intubated 6/23 - Cortrak replaced (tip gastric per Cortrak team), extubated, later reintubated 7/3 s/p trach   Transitioned to cuffless trach yesterday. Tolerating ATC well, secretions improving. Mentation remains altered. Able to follow commands on right but no movement on left. Per RN, pt tolerating tube feeding at goal. Documented 2000 ml out of rectal tube yesterday. Per RN, minimal stool output this am. Continue current interventions. Follow for diet advancement per SLP.   Admission weight: 77.5 kg  Current weight: 85 kg (down 10 kg in the last week)  I/O's not up to date  Medications: SS novolog, lantus Labs: CBG 72-203   Diet Order:   Diet Order            Diet NPO time specified  Diet effective midnight                 EDUCATION NEEDS:   Not  appropriate for education at this time  Skin:  Skin Assessment: Skin Integrity Issues: Incisions: right groin MASD: buttocks, R groin  Last BM:  7/20 via rectal tube  Height:   Ht Readings from Last 1 Encounters:  08/31/19 _0  (1.549 m)    Weight:   Wt Readings from Last 1 Encounters:  09/30/19 85 kg    Ideal Body Weight:  47.7 kg  BMI:  Body mass index is 35.41 kg/m.  Estimated Nutritional Needs:   Kcal:  1550-1750  Protein:  90-105 grams  Fluid:  >1.6 L/day  Mariana Single RD, LDN Clinical Nutrition Pager listed in [place]

## 2019-09-30 NOTE — Progress Notes (Signed)
Progress Note    Donna [name]  VXY:[id number] DOB: 1951/02/25  DOA: 08/25/2019 PCP: Minette Brine, FNP    Brief Narrative:   Medical records reviewed and are as summarized below:  Donna [name] is an 69 y.o. 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  Admitted by Neuro IR on 6/14 for cerebral angiogram s/p RT ICA cervical/ petrous junction balloon angioplasty for severe stenosis via right femoral approach. Was placed on ASA/ brillinta. Monitored in Neuro ICU. Noted to have developed hypotension, increased left sided weakness and lethargy on 6/15 am with Hgb drop from 13.3 to 6.9 with new AKI. Post MRI/ MRA showed scattered small acute infarcts along the right MCA/ watershed territory suspected to be resultant either post procedure vs hypoperfusion.   A CT abd/ pelvis was obtained given concern for retroperitoneal bleed which showed a mild to moderate amount of retroperitoneal hematoma in the right iliac and inguinal regiones with moderate size hematoma noted the the soft tissues anterior to the musculature of the right hip; no definite intrapertioneal hemorrhage. INR, PT, and platelets rechecked and were wnl. Vascular surgery was consulted with plans to observe given bleeding was mostly into the muscle and tissue.  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 Off precedex. Dense hemiplegia on left.  - Continue current nutrition through cortrak. Avoid anticoagulation or antiplatelet given high risk of bleeding -plan for LTAC-- await insurance approval  Acute metabolic encephalopathy: Looks comfortable but too weak to have any sort of conversation. Unable to assess orientation. - cotninue seroquel at 164m QHS (has helped come off precedex) - check QTc prn - clonidine taper to off per pharmacy protocol - end date 09/27/19 (could have some rebound HTN)  Acute on chronic respiratory failure with hypoxia requiring mechanical ventilation.   -Failed trial of extubation due to upper airway stridor.  Cords visibly scarred on reintubation. S/P trach 7/4. -getting intermittent Lasix doses   Volume Overload with hypertension:  -She has been getting Lasix doses intermittently based on her daily assessment.  -last dose of Lasix 40 mg IV on 7/17  - Goal to keep SBP<160  Acute blood loss anemia  -> Anemia of critical illness : - Jehovah's Witness, no blood products including albumin. Hemoglobin improved to 6.8 today. Continue severe anemia protocol: Iron, Folic acid, vitamin BM27 Monitor off  Epogen and Vit C that was previously given Limit blood draws to every 4-7 days as needed  Expect slow recovery  Diarrhea:  -most likely due to tube feedings.  Uncontrolled diabetes with hyperglycemia:  -Blood sugar controlled on current regimen.  obesity Body mass index is 35.41 kg/m.  Nutrition Status: Nutrition Problem:  Inadequate oral intake Etiology: inability to eat Signs/Symptoms: NPO status Interventions: Prostat, Tube feeding   Family Communication/Anticipated D/C date and plan/Code  Status    Code Status: Full Code.  Disposition Plan: Status is: Inpatient Son at bedside  Remains inpatient appropriate because:Unsafe d/c plan   Dispo: The patient is from: Home              Anticipated d/c is to: LTAC              Anticipated d/c date is: when bed available              Patient currently is medically stable to d/c. to [facility]:    IR  PCCM  Neurology  Vascular  Palliative care  hematology  Subjective:   Son reports she has been sleeping on and off  Objective:    Vitals:   09/30/19 1147 09/30/19 1232 09/30/19 1334 09/30/19 1534  BP:  (!) 142/66    Pulse: (!) 110 (!) 101    Resp:  (!) 25 (!) 21 (!) 21  Temp:  98 F (36.7 C)    TempSrc:  Axillary    SpO2:    99%  Weight:      Height:        Intake/Output Summary (Last 24 hours) at 09/30/2019 1636 Last data filed at 09/29/2019 2100 Gross per 24 hour  Intake --  Output 700 ml  Net -700 ml   Filed Weights   09/25/19 0500 09/29/19 0500 09/30/19 0335  Weight: 90.9 kg 86 kg 85 kg    Exam:  General: Appearance:    Obese female in no acute distress     Lungs:     Trach in place, respirations unlabored  Heart:    Tachycardic. Normal rhythm. No murmurs, rubs, or gallops.   MS: Skin around nail bed dry          Data Reviewed:   I have personally reviewed following labs and imaging studies:  Labs: Labs show the following:   Basic Metabolic Panel: Recent Labs  Lab 09/26/19 0537  NA 141  K 3.8  CL 106  CO2 30  GLUCOSE 141*  BUN 17  CREATININE 0.58  CALCIUM 8.7*  MG 1.7  PHOS 3.1   GFR Estimated Creatinine Clearance: 66.6 mL/min (by C-G formula based on SCr of 0.58 mg/dL). Liver Function Tests: Recent Labs  Lab 09/26/19 0537  AST 31  ALT 42  ALKPHOS 57  BILITOT 0.5  PROT 5.5*  ALBUMIN 1.8*   No results for input(s): LIPASE, AMYLASE in the last 168 hours. No results for input(s): AMMONIA in the last 168 hours. Coagulation  profile No results for input(s): INR, PROTIME in the last 168 hours.  CBC: Recent Labs  Lab 09/26/19 0257  WBC 11.3*  HGB 6.8*  HCT 22.5*  MCV 101.8*  PLT 247   Cardiac Enzymes: No results for input(s): CKTOTAL, CKMB, CKMBINDEX, TROPONINI in the last 168 hours. BNP (last 3 results) No results for input(s): PROBNP in the last 8760 hours. CBG: Recent Labs  Lab 09/30/19 0333 09/30/19 0744 09/30/19 1235 09/30/19 1430 09/30/19 1500  GLUCAP 143* 72 260* 247* 225*   D-Dimer: No results for input(s): DDIMER in the last 72 hours. Hgb A1c: No results for input(s): HGBA1C in the last 72 hours. Lipid Profile: No results for input(s): CHOL, HDL, LDLCALC, TRIG, CHOLHDL, LDLDIRECT in the last 72 hours. Thyroid function studies:  No results for input(s): TSH, T4TOTAL, T3FREE, THYROIDAB in the last 72 hours.  Invalid input(s): FREET3 Anemia work up: No results for input(s): VITAMINB12, FOLATE, FERRITIN, TIBC, IRON, RETICCTPCT in the last 72 hours. Sepsis Labs: Recent Labs  Lab 09/26/19 0257  WBC 11.3*    Microbiology Recent Results (from the past 240 hour(s))  MRSA PCR Screening     Status: None   Collection Time: 09/25/19  5:24 AM   Specimen: Nasopharyngeal  Result Value Ref Range Status   MRSA by PCR NEGATIVE NEGATIVE Final    Comment:        The GeneXpert MRSA Assay (FDA approved for NASAL specimens only), is one component of a comprehensive MRSA colonization surveillance program. It is not intended to diagnose MRSA infection nor to guide or monitor treatment for MRSA infections. Performed at [facility], [place] [address], [place], [place] [address]     Procedures and diagnostic studies:  No results found.  Medications:   . amLODipine  10 mg Per Tube Daily  . atorvastatin  40 mg Per NG tube Daily  . chlorhexidine gluconate (MEDLINE KIT)  15 mL Mouth Rinse BID  . Chlorhexidine Gluconate Cloth  6 each Topical Daily  . famotidine  20 mg Per Tube BID  .  feeding supplement (PROSource TF)  45 mL Per Tube TID  . ferrous sulfate  220 mg Per Tube TID WC  . hydrALAZINE  100 mg Per Tube Q6H  . hydrocerin   Topical TID  . insulin aspart  0-15 Units Subcutaneous Q4H  . insulin aspart  4 Units Subcutaneous Q4H  . insulin glargine  16 Units Subcutaneous Daily  . mouth rinse  15 mL Mouth Rinse 10 times per day  . QUEtiapine  100 mg Per Tube QHS  . sodium chloride flush  10-40 mL Intracatheter Q12H   Continuous Infusions: . sodium chloride 10 mL/hr at 09/25/19 1800  . feeding supplement (JEVITY 1.2 CAL) 1,000 mL (09/28/19 1157)     LOS: 36 days   Geradine Girt  [facility]   How to contact the [facility] Attending or Consulting provider [place] or covering provider during after hours [place], for this patient?  1. Check the care team in [facility] and look for a) attending/consulting [facility] provider listed and b) the [facility] team listed 2. Log into [email or web address] and use [place]'s universal password to access. If you do not have the password, please contact the hospital operator. 3. Locate the [facility] provider you are looking for under [facility] and page to a number that you can be directly reached. 4. If you still have difficulty reaching the provider, please page the [facility] (Director on Call) for the Hospitalists listed on amion for assistance.  09/30/2019, 4:36 PM

## 2019-09-30 NOTE — Progress Notes (Signed)
Trach changed from 6.0 cuffed shiley to 6.0 CFS at this time. Very simple change, no difficulty. Small amount bleeding noted. Positive ETCO2 color change.  SAT 98%. Trach secured per policy. RT to monitor.

## 2019-10-01 ENCOUNTER — Telehealth: Payer: Medicare HMO

## 2019-10-01 LAB — GLUCOSE, CAPILLARY
Glucose-Capillary: 125 mg/dL — ABNORMAL HIGH (ref 70–99)
Glucose-Capillary: 141 mg/dL — ABNORMAL HIGH (ref 70–99)
Glucose-Capillary: 142 mg/dL — ABNORMAL HIGH (ref 70–99)
Glucose-Capillary: 165 mg/dL — ABNORMAL HIGH (ref 70–99)
Glucose-Capillary: 185 mg/dL — ABNORMAL HIGH (ref 70–99)
Glucose-Capillary: 203 mg/dL — ABNORMAL HIGH (ref 70–99)

## 2019-10-01 MED ORDER — LIP MEDEX EX OINT
TOPICAL_OINTMENT | CUTANEOUS | Status: DC | PRN
  Administered 2019-10-02: 1 via TOPICAL
  Filled 2019-10-01 (×2): qty 7

## 2019-10-01 NOTE — Progress Notes (Signed)
PROGRESS NOTE    Donna [name]  QIO:[id number] DOB: 09/27/1950 DOA: 08/25/2019 PCP: Minette Brine, FNP   Brief Narrative:   Donna [name] is an 69 y.o. 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  Admitted by Neuro IR on 6/14 for cerebral angiogram s/p RT ICA cervical/ petrous junction balloon angioplasty for severe stenosis via right femoral approach. Was placed on ASA/ brillinta. Monitored in Neuro ICU. Noted to have developed hypotension, increased left sided weakness and lethargy on 6/15 am with Hgb drop from 13.3 to 6.9 with new AKI. Post MRI/ MRA showed scattered small acute infarcts along the right MCA/ watershed territory suspected to be resultant either post procedure vs hypoperfusion.   A CT abd/ pelvis was obtained given concern for retroperitoneal bleed which showed a mild to moderate amount of retroperitoneal hematoma in the right iliac and inguinal regiones with moderate size hematoma noted the the soft tissues anterior to the musculature of the right hip; no definite intrapertioneal hemorrhage. INR, PT, and platelets rechecked and were wnl. Vascular surgery was consulted with plans to observe given bleeding was mostly into the muscle and tissue.  Now became septic on IV antibiotics due to pneumonia.  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 Off precedex. Dense hemiplegia on left.  -Continue current nutrition throughcortrak.Avoid anticoagulation or antiplatelet given high risk of bleeding -plan for LTAC-- await insurance approval  Acutemetabolicencephalopathy: Looks comfortable but too weak to have any sort of conversation. Unable to assess orientation. - cotninue seroquel at 157m QHS (has helped come off precedex) - check QTc prn - clonidine taper to off per pharmacy protocol - end date 09/27/19 (could have some rebound HTN)  Acute on chronic respiratory failure with hypoxia requiring mechanical ventilation.  -Failed trial of extubation due to upper airway stridor. Cords visibly scarred on reintubation. S/P trach 7/4. -getting intermittent Lasix doses   Volume Overload with hypertension:  -She has been getting Lasix doses intermittently based on her daily assessment.  -last dose of Lasix 40 mg IV on 7/17  - Goal to keep SBP<160  Acute blood loss anemia ->Anemia of critical illness : - Jehovah's Witness, no blood products including albumin.Hemoglobin improved to 6.8 today. Continue severe anemia protocol: Iron, Folic acid, vitamin BL24 Monitor off Epogen and Vit C that was previously given Limit blood draws to every 4-7 days as needed  Expect slow recovery  Diarrhea:  -most likely due to tube feedings.  Uncontrolled diabetes with hyperglycemia:  -Blood sugar controlled on current regimen.  obesity Body mass index is 35.41 kg/m.  Nutrition Status: Nutrition Problem: Inadequate oral intake Etiology: inability to eat Signs/Symptoms: NPO status Interventions: Prostat, Tube feeding   DVT prophylaxis:SCDs Code Status: Full Family  Communication: None at bedside; will plan to call son Disposition Plan:    Status is: Inpatient  Remains inpatient appropriate because:Unsafe d/c plan   Dispo: The patient is from: Home              Anticipated d/c is to: LTAC              Anticipated d/c date is: > 3 days              Patient currently is not medically stable to d/c.  Consultants:   IR  PCCM  Neurology  Vascular  Palliative care  hematology  Procedures:   See above  Antimicrobials:  Anti-infectives (From admission, onward)   Start     Dose/Rate Route Frequency Ordered Stop   09/16/19 2130  vancomycin (VANCOCIN) IVPB 1000 mg/200 mL premix  Status:  Discontinued        1,000 mg 200 mL/hr over 60 Minutes Intravenous Every 12 hours 09/16/19 0839 09/18/19 0756   09/16/19 1630  piperacillin-tazobactam (ZOSYN) IVPB 3.375 g        3.375 g 12.5 mL/hr over 240 Minutes Intravenous Every 8 hours 09/16/19 0835 09/23/19 1153   09/16/19 1000  ceFEPIme (MAXIPIME) 1 g in sodium chloride 0.9 % 100 mL IVPB  Status:  Discontinued        1 g 200 mL/hr over 30 Minutes Intravenous Every 12 hours 09/16/19 0756 09/16/19 0803   09/16/19 0845  piperacillin-tazobactam (ZOSYN) IVPB 3.375 g        3.375 g 100 mL/hr over 30 Minutes Intravenous  Once 09/16/19 0834 09/16/19 1007   09/16/19 0845  vancomycin (VANCOREADY) IVPB 1500 mg/300 mL        1,500 mg 150 mL/hr over 120 Minutes Intravenous  Once 09/16/19 0839 09/16/19 1248   09/16/19 0830  piperacillin-tazobactam (ZOSYN) IVPB 3.375 g  Status:  Discontinued        3.375 g 12.5 mL/hr over 240 Minutes Intravenous Every 8 hours 09/16/19 0803 09/16/19 0834   09/16/19 0800  vancomycin (VANCOCIN) IVPB 1000 mg/200 mL premix  Status:  Discontinued        1,000 mg 200 mL/hr over 60 Minutes Intravenous Every 12 hours 09/16/19 0756 09/16/19 0839   08/31/19 1400  Ampicillin-Sulbactam (UNASYN) 3 g in sodium chloride 0.9 % 100 mL IVPB  Status:  Discontinued        3 g 200 mL/hr over 30 Minutes Intravenous Every 6 hours 08/31/19 1357 09/02/19 0819   08/25/19  0723  ceFAZolin (ANCEF) IVPB 2g/100 mL premix        2 g 200 mL/hr over 30 Minutes Intravenous 60 min pre-op 08/25/19 0723 08/25/19 0930       Subjective: Patient seen and evaluated today with no new acute complaints or concerns. No acute concerns or events noted overnight. She is alert and follows simple commands.  Objective: Vitals:   10/01/19 0315 10/01/19 0348 10/01/19 0400 10/01/19 1255  BP:   135/62 (!) 149/68  Pulse:    (!) 105  Resp:   15 20  Temp: 98.7 F (37.1 C)   99.5 F (37.5 C)  TempSrc: Axillary   Oral  SpO2:   100% 100%  Weight:  83 kg    Height:        Intake/Output Summary (Last 24 hours) at 10/01/2019 1409 Last data filed at 10/01/2019 0600 Gross per 24 hour  Intake 810 ml  Output --  Net 810 ml   [employer]  09/29/19 0500 09/30/19 0335 10/01/19 0348  Weight: 86 kg 85 kg 83 kg    Examination:  General exam: Appears calm and comfortable  Respiratory system: Clear to auscultation. Respiratory effort normal. Trach with collar present 8L. Cardiovascular system: S1 & S2 heard, RRR. No JVD, murmurs, rubs, gallops or clicks. No pedal edema. Gastrointestinal system: Abdomen is nondistended, soft and nontender. No organomegaly or masses felt. Normal bowel sounds heard. Cortrak feedings. Central nervous system: Alert and awake. Extremities: No edema. Skin: No rashes, lesions or ulcers Psychiatry: Cannot be assessed.    Data Reviewed: I have personally reviewed following labs and imaging studies  CBC: Recent Labs  Lab 09/26/19 0257  WBC 11.3*  HGB 6.8*  HCT 22.5*  MCV 101.8*  PLT 875   Basic Metabolic Panel: Recent Labs  Lab 09/26/19 0537  NA 141  K 3.8  CL 106  CO2 30  GLUCOSE 141*  BUN 17  CREATININE 0.58  CALCIUM 8.7*  MG 1.7  PHOS 3.1   GFR: Estimated Creatinine Clearance: 65.8 mL/min (by C-G formula based on SCr of 0.58 mg/dL). Liver Function Tests: Recent Labs  Lab 09/26/19 0537  AST 31  ALT 42  ALKPHOS 57  BILITOT  0.5  PROT 5.5*  ALBUMIN 1.8*   No results for input(s): LIPASE, AMYLASE in the last 168 hours. No results for input(s): AMMONIA in the last 168 hours. Coagulation Profile: No results for input(s): INR, PROTIME in the last 168 hours. Cardiac Enzymes: No results for input(s): CKTOTAL, CKMB, CKMBINDEX, TROPONINI in the last 168 hours. BNP (last 3 results) No results for input(s): PROBNP in the last 8760 hours. HbA1C: No results for input(s): HGBA1C in the last 72 hours. CBG: Recent Labs  Lab 09/30/19 1905 09/30/19 2256 10/01/19 0316 10/01/19 0759 10/01/19 1255  GLUCAP 162* 131* 165* 185* 203*   Lipid Profile: No results for input(s): CHOL, HDL, LDLCALC, TRIG, CHOLHDL, LDLDIRECT in the last 72 hours. Thyroid Function Tests: No results for input(s): TSH, T4TOTAL, FREET4, T3FREE, THYROIDAB in the last 72 hours. Anemia Panel: No results for input(s): VITAMINB12, FOLATE, FERRITIN, TIBC, IRON, RETICCTPCT in the last 72 hours. Sepsis Labs: No results for input(s): PROCALCITON, LATICACIDVEN in the last 168 hours.  Recent Results (from the past 240 hour(s))  MRSA PCR Screening     Status: None   Collection Time: 09/25/19  5:24 AM   Specimen: Nasopharyngeal  Result Value Ref Range Status   MRSA by PCR NEGATIVE NEGATIVE Final    Comment:        The GeneXpert MRSA Assay (FDA approved for NASAL specimens only), is one component of a comprehensive MRSA colonization surveillance program. It is not intended to diagnose MRSA infection nor to guide or monitor treatment for MRSA infections. Performed at [facility], [place] [address], [place], [place] [address]          Radiology Studies: No results found.      Scheduled Meds: . amLODipine  10 mg Per Tube Daily  . atorvastatin  40 mg Per NG tube Daily  . chlorhexidine gluconate (MEDLINE KIT)  15 mL Mouth Rinse BID  . Chlorhexidine Gluconate Cloth  6 each Topical Daily  . famotidine  20 mg Per Tube BID  . feeding  supplement (PROSource TF)  45 mL Per Tube TID  . ferrous sulfate  220 mg Per Tube TID WC  . hydrALAZINE  100 mg Per Tube Q6H  . hydrocerin   Topical TID  . insulin aspart  0-15 Units Subcutaneous Q4H  . insulin aspart  4 Units Subcutaneous Q4H  . insulin glargine  16 Units Subcutaneous Daily  . mouth rinse  15 mL Mouth Rinse 10 times per day  . QUEtiapine  100 mg Per Tube QHS  . sodium chloride flush  10-40 mL Intracatheter Q12H   Continuous Infusions: . sodium chloride 10 mL/hr at 09/25/19 1800  . feeding supplement (JEVITY 1.2 CAL) 1,000 mL (10/01/19 0451)     LOS: 37 days    Time spent: 35 minutes    Gibril Mastro D Manuella Ghazi, DO [facility]  If 7PM-7AM, please contact night-coverage [email or web address] 10/01/2019, 2:09 PM

## 2019-10-01 NOTE — Progress Notes (Signed)
Referring Physician(s): Melvenia Beam  Supervising Physician: Luanne Bras  Patient Status:  [facility] - In-pt  Chief Complaint: None-tracheostomy without sedation  Subjective:  Right ICA cervical/petrous junction stenosis s/p revascularization using balloon angioplasty via right radial and right femoral approach6/14/2021by Dr. Estanislado Pandy. Mild-moderate right thigh (with some retroperitoneal component) hematoma. Acute blood loss anemia, Jehovah's Witness. Patientawakelaying in bed, tracheostomy in place,no sedation.Shefollowssimple commands and is alert today. Follows commands with right side, demonstrates neglect of left side. Right groin and right radial puncture sites c/d/i. Most recenthgbwas 6.8 09/26/2019 (up from4.7 on 09/20/2019). No family at bedside this AM.   Allergies: Patient has no known allergies.  Medications: Prior to Admission medications   Medication Sig Start Date End Date Taking? Authorizing Provider  amLODipine (NORVASC) 10 MG tablet Take 1 tablet (10 mg total) by mouth daily. 05/12/19  Yes Minette Brine, FNP  aspirin EC 81 MG tablet Take 81 mg by mouth every evening.   Yes [provider]  Calcium Carbonate-Vitamin D (CALCIUM-D PO) Take 2 tablets by mouth daily at 12 noon.    Yes [provider]  carvedilol (COREG) 6.25 MG tablet TAKE 1 TABLET(6.25 MG) BY MOUTH TWICE DAILY Patient taking differently: Take 6.25 mg by mouth 2 (two) times daily with a meal.  07/31/19  Yes Minette Brine, FNP  hydrALAZINE (APRESOLINE) 50 MG tablet Take 1 tablet (50 mg total) by mouth 2 (two) times daily. 06/16/19 09/14/19 Yes Elouise Munroe, MD  Insulin Degludec-Liraglutide (XULTOPHY) 100-3.6 UNIT-MG/ML SOPN Inject 30 Units into the skin daily. 08/14/19  Yes Minette Brine, FNP  telmisartan-hydrochlorothiazide (MICARDIS HCT) 40-12.5 MG tablet Take 1 tablet by mouth daily. 07/31/19  Yes Minette Brine, FNP  ticagrelor (BRILINTA) 90 MG TABS tablet Take 1  tablet (90 mg total) by mouth 2 (two) times daily. 06/12/19  Yes Elouise Munroe, MD  vitamin C (ASCORBIC ACID) 250 MG tablet Take 500 mg by mouth daily at 12 noon.   Yes [provider]  blood glucose meter kit and supplies KIT Dispense based on patient and insurance preference. Use up to four times daily as directed. (FOR ICD-9 250.00, 250.01). 05/10/18   Minette Brine, FNP  Blood Glucose Monitoring Suppl (TRUE METRIX METER) w/Device KIT 1 each by Does not apply route in the morning, at noon, in the evening, and at bedtime. 05/22/19   Minette Brine, FNP  Evolocumab (REPATHA SURECLICK) 161 MG/ML SOAJ Inject 140 mg into the skin every 14 (fourteen) days. 06/30/19   Elouise Munroe, MD  glucose blood (TRUE METRIX BLOOD GLUCOSE TEST) test strip Check blood sugar 4 times a day before meals and bedtime 05/22/19   Minette Brine, FNP  Insulin Glargine-Lixisenatide (SOLIQUA) 100-33 UNT-MCG/ML SOPN Inject 25 Units into the skin daily. Patient taking differently: Inject 30 Units into the skin daily.  07/14/19   Minette Brine, FNP  Multiple Vitamin (MULTIVITAMIN PO) Take 1 tablet by mouth daily at 12 noon.     [provider]  Omega-3 Fatty Acids (OMEGA-3 PLUS PO) Take 2 capsules by mouth daily at 12 noon.    [provider]     Vital Signs: BP 135/62   Pulse (!) 101   Temp 98.7 F (37.1 C) (Axillary)   Resp 15   Ht 5' 1"  (1.549 m)   Wt 182 lb 15.7 oz (83 kg)   SpO2 100%   BMI 34.57 kg/m   Physical Exam Vitals and nursing note reviewed.  Constitutional:  General: She is not in acute distress.    Comments: Tracheostomy without sedation.  Pulmonary:     Effort: Pulmonary effort is normal. No respiratory distress.     Comments: Tracheostomy without sedation. Skin:    General: Skin is warm and dry.     Comments: Gross anasarca. Right groin puncture site soft without active bleeding or hematoma.  Neurological:     Comments: Tracheostomy without sedation. Alert  today, nods to yes/no questions appropriately, follows simple commands of right side. PERRL bilaterally. Follows commands with right side (wiggles right toes and bends right knee, grips with right hand and bends right elbow), demonstrates neglect of left side. Distal pulses (DPs) 1+ bilaterally.     Imaging: No results found.  Labs:  CBC: Recent Labs    09/17/19 0703 09/18/19 0923 09/20/19 0500 09/26/19 0257  WBC 17.5* 15.3* 10.4 11.3*  HGB 5.6* 4.7* 4.7* 6.8*  HCT 19.0* 16.4* 17.5* 22.5*  PLT 448* 435* 447* 247    COAGS: Recent Labs    11/27/18 0728 08/18/19 0705 08/26/19 1715 08/27/19 0230  INR 0.9 0.9 1.1 1.2    BMP: Recent Labs    09/18/19 0923 09/20/19 0500 09/21/19 0500 09/26/19 0537  NA 142 144 142 141  K 4.4 3.1* 3.3* 3.8  CL 103 104 103 106  CO2 27 28 30 30   GLUCOSE 151* 181* 145* 141*  BUN 42* 47* 41* 17  CALCIUM 8.7* 8.5* 8.6* 8.7*  CREATININE 1.01* 0.97 0.81 0.58  GFRNONAA 57* >60 >60 >60  GFRAA >60 >60 >60 >60    LIVER FUNCTION TESTS: Recent Labs    09/17/19 0703 09/20/19 0500 09/21/19 0500 09/26/19 0537  BILITOT 1.1 0.7 0.3 0.5  AST 32 27 31 31   ALT 23 29 36 42  ALKPHOS 82 95 81 57  PROT 6.6 7.4 7.1 5.5*  ALBUMIN 1.6* 1.5* 1.6* 1.8*    Assessment and Plan:  Right ICA cervical/petrous junction stenosis s/p revascularization using balloon angioplasty via right radial and right femoral approach6/14/2021by Dr. Estanislado Pandy. Mild-moderate right thigh (with some retroperitoneal component) hematoma. Acute blood loss anemia, Jehovah's Witness. Patient's condition stable-tracheostomy withoutsedation, alert today, follows simple commands of right side, demonstrates neglect of left side. Right groinand right radial puncture sitesstable, no signs of active bleeding. Hgb 6.8 09/26/2019-receiving supplementation per severe anemia protocol. For possible transfer to LTAC, awaiting bed placement. Continue to hold  Brilinta/Aspirin/Lovenox.Agree with CCM plans toavoid excess blood drawl. Further plans per [facility]/neurology/palliative care- appreciate and agree with management. NIR to follow.   Electronically Signed: Earley Abide, PA-C 10/01/2019, 11:11 AM   I spent a total of 25 Minutes at the the patient's bedside AND on the patient's hospital floor or unit, greater than 50% of which was counseling/coordinating care for right ICA stenosis s/p revascularization.

## 2019-10-01 NOTE — Progress Notes (Signed)
Occupational Therapy Treatment Patient Details Name: Donna [name] MRN: [id number] DOB: 07-Nov-1950 Today's Date: 10/01/2019    History of present illness 69 y.o. female with PMH significant for HTN, HLD, CVA 06/2018, DM, anemia, obesity, OSA, and OA. She is [facility] to [facility] and has been followed by Dr. Corliss Skains since 10/2018. Pt underwent R ICA angioplasty on 6/14. Pt with decline in neuro status and hemoglobin on 6/15, found to have a large R groin hematoma.  Pt has had Hbg 5.0 or less and is a Jehovah's witness.  She was intubated on 6/19. Pt underwent tracheostomy on 7/3. PT found to be septic with RLL PNA on 7/6. 7/13 trach tolerating extubation   OT comments  Pt progressing with ability to follow one-step commands, so OT goals updated as appropriate. Pt Total A x 2 for bed mobility to sit EOB. Once EOB, pt able to maintain sitting balance at min guard slowly progressing to Mod A to maintain sitting balance once fatigued. Pt demonstrated ability to wipe secretions from mouth when cued sitting EOB. Pt also noted with purposeful movement to scratch back with R UE. Pt remains flaccid on LUE - repositioned to decrease swelling.    Follow Up Recommendations  SNF    Equipment Recommendations  Wheelchair cushion (measurements OT);Wheelchair (measurements OT);Hospital bed;Other (comment)    Recommendations for Other Services      Precautions / Restrictions Precautions Precautions: Fall Precaution Comments: trach, low hbg Required Braces or Orthoses: Other Brace Other Brace: PRAFO LLE Restrictions Weight Bearing Restrictions: No       Mobility Bed Mobility Overal bed mobility: Needs Assistance Bed Mobility: Supine to Sit;Sit to Supine     Supine to sit: Total assist;+2 for physical assistance;HOB elevated Sit to supine: +2 for physical assistance;Total assist;HOB elevated   General bed mobility comments: Total A x 2 to advance LE and trunk, minimal initiation of LEs to EOB.    Transfers                 General transfer comment: unable    Balance Overall balance assessment: Needs assistance Sitting-balance support: Single extremity supported;Feet supported Sitting balance-Leahy Scale: Poor Sitting balance - Comments: Sat EOB up to 10 minutes, initially requiring min guard for balance but progressed to Mod A to maintain sitting balance after fatigue. Pt with L lateral/posterior lean Postural control: Posterior lean;Left lateral lean                                 ADL either performed or assessed with clinical judgement   ADL Overall ADL's : Needs assistance/impaired Eating/Feeding: NPO   Grooming: Minimal assistance;Wash/dry face Grooming Details (indicate cue type and reason): Min A to wipe secretions from mouth, able to follow commands to complete at supervision level with 2nd trial. Pt completed task sitting EOB while physical assist provided to maintain sitting balance             Lower Body Dressing: Total assistance;Bed level Lower Body Dressing Details (indicate cue type and reason): Total A to don socks               General ADL Comments: Pt demonstrating ability to follow one step commands today, able to tolerate sitting EOB during task with min guard to maintain balance progressing to Mod A with fatigue     Vision   Vision Assessment?: Vision impaired- to be further tested in functional context  Perception     Praxis      Cognition Arousal/Alertness: Awake/alert Behavior During Therapy: Flat affect Overall Cognitive Status: Difficult to assess Area of Impairment: Attention;Following commands;Problem solving                   Current Attention Level: Sustained   Following Commands: Follows one step commands inconsistently;Follows one step commands with increased time Safety/Judgement: Decreased awareness of safety;Decreased awareness of deficits Awareness: Intellectual Problem Solving: Slow  processing;Requires verbal cues;Decreased initiation General Comments: Pt follows one step commands 60% of the time, increased difficulty following commands with fatigue.         Exercises     Shoulder Instructions       General Comments HR up to 122bpm sittint EOB, BP sitting EOB up to 130s/110s but likely not accurate as pateint kept movnig arm during reading/BP cuff kept restarting. Unable to verbalize if any symptoms but did not appear to have cognitive status changes.    Pertinent Vitals/ Pain       Pain Assessment: Faces Faces Pain Scale: Hurts a little bit Pain Location: mild grimacing with movement Pain Descriptors / Indicators: Grimacing Pain Intervention(s): Limited activity within patient's tolerance;Monitored during session  Home Living                                          Prior Functioning/Environment              Frequency  Min 2X/week        Progress Toward Goals  OT Goals(current goals can now be found in the care plan section)  Progress towards OT goals: Progressing toward goals  Acute Rehab OT Goals Patient Stated Goal: none stated OT Goal Formulation: Patient unable to participate in goal setting Time For Goal Achievement: 10/16/19 Potential to Achieve Goals: Fair ADL Goals Pt Will Perform Grooming: with supervision;sitting Pt Will Perform Upper Body Bathing: with mod assist;bed level;sitting Additional ADL Goal #1: Pt will tolerate splint wear Lt UE Additional ADL Goal #2: Pt to demonstrate ability to maintain static sitting balance EOB with no more than min guard for 10 minutes in order to improve endurance for ADLs Additional ADL Goal #3: Pt to scan and identify at least 3 ADL items in L visual field to improve safety and awareness during daily tasks.  Plan Discharge plan remains appropriate    Co-evaluation    PT/OT/SLP Co-Evaluation/Treatment: Yes Reason for Co-Treatment: Complexity of the patient's impairments  (multi-system involvement);Necessary to address cognition/behavior during functional activity;For patient/therapist safety;To address functional/ADL transfers   OT goals addressed during session: ADL's and self-care      AM-PAC OT "6 Clicks" Daily Activity     Outcome Measure   Help from another person eating meals?: Total Help from another person taking care of personal grooming?: A Lot Help from another person toileting, which includes using toliet, bedpan, or urinal?: Total Help from another person bathing (including washing, rinsing, drying)?: Total Help from another person to put on and taking off regular upper body clothing?: Total Help from another person to put on and taking off regular lower body clothing?: Total 6 Click Score: 7    End of Session Equipment Utilized During Treatment: Oxygen  OT Visit Diagnosis: Muscle weakness (generalized) (M62.81);Cognitive communication deficit (R41.841);Hemiplegia and hemiparesis Symptoms and signs involving cognitive functions: Cerebral infarction Hemiplegia - Right/Left: Left Hemiplegia - dominant/non-dominant: Non-Dominant  Hemiplegia - caused by: Cerebral infarction   Activity Tolerance Patient limited by fatigue   Patient Left in bed;with call bell/phone within reach;with bed alarm set;with SCD's reapplied   Nurse Communication Mobility status        Time: 3276-1470 OT Time Calculation (min): 23 min  Charges: OT General Charges $OT Visit: 1 Visit OT Treatments $Therapeutic Activity: 8-22 mins  Lorre Munroe, OTR/L   Lorre Munroe 10/01/2019, 2:02 PM

## 2019-10-01 NOTE — Progress Notes (Signed)
Physical Therapy Treatment Patient Details Name: Donna [name] MRN: [id number] DOB: [birth year] Today's Date: 10/01/2019    History of Present Illness 69 y.o. female with PMH significant for HTN, HLD, CVA 06/2018, DM, anemia, obesity, OSA, and OA. She is known to [facility] and has been followed by Dr. Estanislado Pandy since 10/2018. Pt underwent R ICA angioplasty on 6/14. Pt with decline in neuro status and hemoglobin on 6/15, found to have a large R groin hematoma.  Pt has had Hbg 5.0 or less and is a Jehovah's witness.  She was intubated on 6/19. Pt underwent tracheostomy on 7/3. PT found to be septic with RLL PNA on 7/6. 7/13 trach tolerating extubation    PT Comments    Patient received in bed on trach collar, lethargic but opening eyes and responding to localized stimuli such as sternal rub. Able to get to EOB with totalAx2 and maintained sitting for approximately 10 minutes initially with min guard but this increased to [place] as she fatigued with posterior and left lean, also intermittent L pushing. Performed sitting to elbow twice with totalAx2 for weightbearing L UE. Returned to bed with totalAx2 and positioned to comfort with all needs met, bed alarm active.     Follow Up Recommendations  SNF     Equipment Recommendations  Wheelchair (measurements PT);Wheelchair cushion (measurements PT);Hospital bed    Recommendations for Other Services       Precautions / Restrictions Precautions Precautions: Fall Precaution Comments: trach, low hbg Required Braces or Orthoses: Other Brace Other Brace: PRAFO LLE Restrictions Weight Bearing Restrictions: No    Mobility  Bed Mobility Overal bed mobility: Needs Assistance Bed Mobility: Supine to Sit;Sit to Supine     Supine to sit: Total assist;+2 for physical assistance;HOB elevated Sit to supine: +2 for physical assistance;Total assist;HOB elevated   General bed mobility comments: Total A x 2 to advance LE and trunk, minimal initiation of LEs to  EOB.   Transfers                 General transfer comment: unable  Ambulation/Gait             General Gait Details: unable   Stairs             Wheelchair Mobility    Modified Rankin (Stroke Patients Only)       Balance Overall balance assessment: Needs assistance Sitting-balance support: Single extremity supported;Feet supported Sitting balance-Leahy Scale: Poor Sitting balance - Comments: Sat EOB up to 10 minutes, initially requiring min guard for balance but progressed to Mod A to maintain sitting balance after fatigue. Pt with L lateral/posterior lean Postural control: Posterior lean;Left lateral lean                                  Cognition Arousal/Alertness: Awake/alert Behavior During Therapy: Flat affect Overall Cognitive Status: Difficult to assess Area of Impairment: Attention;Following commands;Problem solving                   Current Attention Level: Sustained   Following Commands: Follows one step commands inconsistently;Follows one step commands with increased time Safety/Judgement: Decreased awareness of safety;Decreased awareness of deficits Awareness: Intellectual Problem Solving: Slow processing;Requires verbal cues;Decreased initiation General Comments: Pt follows one step commands 60% of the time, increased difficulty following commands with fatigue.       Exercises      General Comments General comments (skin integrity,  edema, etc.): HR up to 122bpm sittint EOB, BP sitting EOB up to 130s/110s but likely not accurate as pateint kept movnig arm during reading/BP cuff kept restarting. Unable to verbalize if any symptoms but did not appear to have cognitive status changes.      Pertinent Vitals/Pain Pain Assessment: Faces Faces Pain Scale: Hurts a little bit Pain Location: mild grimacing with movement Pain Descriptors / Indicators: Grimacing Pain Intervention(s): Limited activity within patient's  tolerance;Monitored during session    Home Living                      Prior Function            PT Goals (current goals can now be found in the care plan section) Acute Rehab PT Goals Patient Stated Goal: none stated PT Goal Formulation: Patient unable to participate in goal setting Time For Goal Achievement: 10/10/19 Potential to Achieve Goals: Fair Progress towards PT goals: Progressing toward goals    Frequency    Min 3X/week      PT Plan Current plan remains appropriate    Co-evaluation   Reason for Co-Treatment: Complexity of the patient's impairments (multi-system involvement);Necessary to address cognition/behavior during functional activity;For patient/therapist safety;To address functional/ADL transfers   OT goals addressed during session: ADL's and self-care      AM-PAC PT "6 Clicks" Mobility   Outcome Measure  Help needed turning from your back to your side while in a flat bed without using bedrails?: Total Help needed moving from lying on your back to sitting on the side of a flat bed without using bedrails?: Total Help needed moving to and from a bed to a chair (including a wheelchair)?: Total Help needed standing up from a chair using your arms (e.g., wheelchair or bedside chair)?: Total Help needed to walk in hospital room?: Total Help needed climbing 3-5 steps with a railing? : Total 6 Click Score: 6    End of Session Equipment Utilized During Treatment: Oxygen (trach colalr) Activity Tolerance: Patient tolerated treatment well Patient left: in bed;with SCD's reapplied;with call bell/phone within reach   PT Visit Diagnosis: Other abnormalities of gait and mobility (R26.89);Muscle weakness (generalized) (M62.81);Other symptoms and signs involving the nervous system (R29.898);Hemiplegia and hemiparesis Hemiplegia - Right/Left: Left Hemiplegia - dominant/non-dominant: Non-dominant Hemiplegia - caused by: Cerebral infarction     Time:  2409-7353 PT Time Calculation (min) (ACUTE ONLY): 23 min  Charges:  $Therapeutic Activity: 8-22 mins (co-tx)                     Windell Norfolk, DPT, PN1   Supplemental Physical Therapist [place]    Pager [phone] Acute Rehab Office [phone]

## 2019-10-01 NOTE — Plan of Care (Signed)
  Problem: Education: Goal: Knowledge of General Education information will improve Description: Including pain rating scale, medication(s)/side effects and non-pharmacologic comfort measures Outcome: Progressing   Problem: Health Behavior/Discharge Planning: Goal: Ability to manage health-related needs will improve Outcome: Progressing   Problem: Clinical Measurements: Goal: Ability to maintain clinical measurements within normal limits will improve Outcome: Progressing Goal: Will remain free from infection Outcome: Progressing Goal: Diagnostic test results will improve Outcome: Progressing Goal: Respiratory complications will improve Outcome: Progressing Goal: Cardiovascular complication will be avoided Outcome: Progressing   Problem: Activity: Goal: Risk for activity intolerance will decrease Outcome: Progressing   Problem: Coping: Goal: Level of anxiety will decrease Outcome: Progressing   Problem: Elimination: Goal: Will not experience complications related to bowel motility Outcome: Progressing Goal: Will not experience complications related to urinary retention Outcome: Progressing   Problem: Pain Managment: Goal: General experience of comfort will improve Outcome: Progressing   Problem: Safety: Goal: Ability to remain free from injury will improve Outcome: Progressing   Problem: Skin Integrity: Goal: Risk for impaired skin integrity will decrease Outcome: Progressing   Problem: Cardiovascular: Goal: Vascular access site(s) Level 0-1 will be maintained Outcome: Progressing   Problem: Education: Goal: Knowledge of disease or condition will improve Outcome: Progressing Goal: Knowledge of secondary prevention will improve Outcome: Progressing Goal: Knowledge of patient specific risk factors addressed and post discharge goals established will improve Outcome: Progressing   Problem: Health Behavior/Discharge Planning: Goal: Ability to manage  health-related needs will improve Outcome: Progressing   Problem: Self-Care: Goal: Ability to participate in self-care as condition permits will improve Outcome: Progressing   Problem: Ischemic Stroke/TIA Tissue Perfusion: Goal: Complications of ischemic stroke/TIA will be minimized Outcome: Progressing   Problem: Activity: Goal: Ability to tolerate increased activity will improve Outcome: Progressing   Problem: Respiratory: Goal: Ability to maintain a clear airway and adequate ventilation will improve Outcome: Progressing   Problem: Role Relationship: Goal: Method of communication will improve Outcome: Progressing   

## 2019-10-01 NOTE — Progress Notes (Addendum)
  Speech Language Pathology Treatment: Dysphagia;Passy Muir Speaking valve  Patient Details Name: Donna [name] MRN: [id number] DOB: 1951-02-23 Today's Date: 10/01/2019 Time: 0258-5277 SLP Time Calculation (min) (ACUTE ONLY): 31 min  Assessment / Plan / Recommendation Clinical Impression  Pt participated in treatment with speaking valve and swallow requiring moderate cueing to sustain attention although less than prior session. Her gaze appeared more focused when she did make eye contact. Responses were moderately delayed versus max. Volitional and reflexive cough is at times effective with mucous seen in trach hub but unable to move and emit secretions using placement of speaking valve and cues for effortful coughs. RN called to deep suction. She continued to exhibit dyspnea for most of session although RR and SpO2 were within normal range.She is not obtaining complete exhalation via nose/mouth and air is building behind trach. Audible noise with each exhalation and suspect mucous may be vibrating between cords. She managed 1-2 words per breath but incoordinated and appeared to strained/on inhalation (?).  Oral care provided followed by applesauce, ice cream and water via spoon/cups without speaking valve. Taste buds altered and effected by trach as she grimaced with all po's (less with the vanilla ice cream). Labial spill present and minimal pooling in oral cavity with suspected delayed swallow onset; no vocal changes or other s/s aspiration. Currently has Cortrak and suspect may not eat enough to eat her needs however am recommending MBS for tomorrow to determine safety and ability to start po's.       HPI HPI: 69 y.o. female with PMH significant for HTN, HLD, CVA 06/2018, DM, anemia, obesity, OSA, and OA. Pt underwent R ICA angioplasty on 6/14. Pt with decline in neuro status and hemoglobin on 6/15, found to have a large R groin hematoma.  Pt has had Hbg 5.0 or less and is a Jehovah's witness. MRI  showed scattered small acute infarcts along the right MCA/watershed territory. Solitary small acute infarct in the left parietal cortex and right cerebellum. She was intubated on 6/19. Pt underwent tracheostomy on 7/3. PT found to be septic with RLL PNA on 7/6.       SLP Plan  Continue with current plan of care       Recommendations  Diet recommendations: NPO Medication Administration: Via alternative means      Patient may use Passy-Muir Speech Valve: with SLP only PMSV Supervision: Full         Oral Care Recommendations: Oral care QID Follow up Recommendations: Skilled Nursing facility;LTACH;24 hour supervision/assistance SLP Visit Diagnosis: Dysphagia, unspecified (R13.10) Plan: Continue with current plan of care                       Donna [name] 10/01/2019, 3:06 PM  Breck Coons Lonell Face.Ed Nurse, children's [phone] Office [phone]

## 2019-10-02 ENCOUNTER — Inpatient Hospital Stay (HOSPITAL_COMMUNITY): Payer: Medicare HMO

## 2019-10-02 ENCOUNTER — Other Ambulatory Visit (HOSPITAL_COMMUNITY): Payer: Medicare HMO

## 2019-10-02 ENCOUNTER — Ambulatory Visit (HOSPITAL_COMMUNITY): Admit: 2019-10-02 | Payer: Medicare HMO | Attending: Internal Medicine | Admitting: Internal Medicine

## 2019-10-02 LAB — GLUCOSE, CAPILLARY
Glucose-Capillary: 136 mg/dL — ABNORMAL HIGH (ref 70–99)
Glucose-Capillary: 121 mg/dL — ABNORMAL HIGH (ref 70–99)
Glucose-Capillary: 193 mg/dL — ABNORMAL HIGH (ref 70–99)
Glucose-Capillary: 183 mg/dL — ABNORMAL HIGH (ref 70–99)
Glucose-Capillary: 177 mg/dL — ABNORMAL HIGH (ref 70–99)

## 2019-10-02 MED ORDER — JEVITY 1.2 CAL PO LIQD
900.0000 mL | ORAL | Status: DC
  Administered 2019-10-02 – 2019-10-05 (×4): 900 mL
  Filled 2019-10-02 (×5): qty 948

## 2019-10-02 MED ORDER — FUROSEMIDE 10 MG/ML IJ SOLN
40.0000 mg | Freq: Once | INTRAMUSCULAR | Status: AC
  Administered 2019-10-02: 40 mg via INTRAVENOUS
  Filled 2019-10-02: qty 4

## 2019-10-02 NOTE — Progress Notes (Signed)
Physical Therapy Treatment Patient Details Name: Donna [name] MRN: [id number] DOB: December 11, 1950 Today's Date: 10/02/2019    History of Present Illness 69 y.o. female with PMH significant for HTN, HLD, CVA 06/2018, DM, anemia, obesity, OSA, and OA. She is known to [facility] and has been followed by Dr. Estanislado Pandy since 10/2018. Pt underwent R ICA angioplasty on 6/14. Pt with decline in neuro status and hemoglobin on 6/15, found to have a large R groin hematoma.  Pt has had Hbg 5.0 or less and is a Jehovah's witness.  She was intubated on 6/19. Pt underwent tracheostomy on 7/3. PT found to be septic with RLL PNA on 7/6. 7/13 trach tolerating extubation    PT Comments    Patient received in bed, awake and alert and able to communicate well with yes/no questions today, willing to participate in PT. Continued to need totalAx2 for all aspects of mobility, as well as Min-ModA to maintain sitting balance at EOB. BP elevated in sitting with MAP 106-107 and significant increase in fatigue and work of breathing/use of accessory muscles today. She confirms that she is very tired this afternoon. Returned to supine and BP/MAP improved significantly. Left in bed with all needs met, bed alarm active positioned to comfort. Will continue to benefit from SNF.     Follow Up Recommendations  SNF     Equipment Recommendations  Wheelchair (measurements PT);Wheelchair cushion (measurements PT);Hospital bed    Recommendations for Other Services       Precautions / Restrictions Precautions Precautions: Fall Precaution Comments: trach, low hbg Required Braces or Orthoses: Other Brace Other Brace: PRAFO LLE Restrictions Weight Bearing Restrictions: No    Mobility  Bed Mobility Overal bed mobility: Needs Assistance Bed Mobility: Supine to Sit;Sit to Supine     Supine to sit: Total assist;+2 for physical assistance;HOB elevated Sit to supine: Total assist;+2 for physical assistance;HOB elevated   General bed  mobility comments: Total A x 2 to advance LE and trunk, minimal initiation of LEs to EOB.   Transfers                 General transfer comment: deferred- fatigue  Ambulation/Gait             General Gait Details: deferred- fatigue   Stairs             Wheelchair Mobility    Modified Rankin (Stroke Patients Only) Modified Rankin (Stroke Patients Only) Pre-Morbid Rankin Score: No symptoms Modified Rankin: Severe disability     Balance Overall balance assessment: Needs assistance Sitting-balance support: Single extremity supported;Feet supported Sitting balance-Leahy Scale: Poor Sitting balance - Comments: Min-ModA to maintain midline sitting but only Postural control: Posterior lean     Standing balance comment: deferred                            Cognition Arousal/Alertness: Awake/alert Behavior During Therapy: Flat affect Overall Cognitive Status: Difficult to assess Area of Impairment: Attention;Following commands;Problem solving                   Current Attention Level: Selective   Following Commands: Follows one step commands consistently;Follows one step commands with increased time Safety/Judgement: Decreased awareness of safety;Decreased awareness of deficits Awareness: Intellectual Problem Solving: Slow processing;Requires verbal cues;Decreased initiation General Comments: patient with improved command following today approximately 75% of the time, but very fatigued      Exercises      General Comments  General comments (skin integrity, edema, etc.): BP elevated with MAP 106-107, patient with increased accessorty and WOB at EOB and very fatigued today      Pertinent Vitals/Pain Pain Assessment: Faces Faces Pain Scale: Hurts a little bit Pain Location: mild grimacing with movement Pain Descriptors / Indicators: Grimacing Pain Intervention(s): Limited activity within patient's tolerance;Monitored during session     Home Living                      Prior Function            PT Goals (current goals can now be found in the care plan section) Acute Rehab PT Goals Patient Stated Goal: none stated PT Goal Formulation: Patient unable to participate in goal setting Time For Goal Achievement: 10/10/19 Potential to Achieve Goals: Fair Progress towards PT goals: Progressing toward goals    Frequency    Min 3X/week      PT Plan Current plan remains appropriate    Co-evaluation              AM-PAC PT "6 Clicks" Mobility   Outcome Measure  Help needed turning from your back to your side while in a flat bed without using bedrails?: Total Help needed moving from lying on your back to sitting on the side of a flat bed without using bedrails?: Total Help needed moving to and from a bed to a chair (including a wheelchair)?: Total Help needed standing up from a chair using your arms (e.g., wheelchair or bedside chair)?: Total Help needed to walk in hospital room?: Total Help needed climbing 3-5 steps with a railing? : Total 6 Click Score: 6    End of Session Equipment Utilized During Treatment: Oxygen (trach collar) Activity Tolerance: Patient tolerated treatment well Patient left: in bed;with SCD's reapplied;with call bell/phone within reach;with bed alarm set   PT Visit Diagnosis: Other abnormalities of gait and mobility (R26.89);Muscle weakness (generalized) (M62.81);Other symptoms and signs involving the nervous system (R29.898);Hemiplegia and hemiparesis Hemiplegia - Right/Left: Left Hemiplegia - dominant/non-dominant: Non-dominant Hemiplegia - caused by: Cerebral infarction     Time: 7215-8727 PT Time Calculation (min) (ACUTE ONLY): 21 min  Charges:  $Therapeutic Activity: 8-22 mins                     Windell Norfolk, DPT, PN1   Supplemental Physical Therapist [place]    Pager [phone] Acute Rehab Office [phone]

## 2019-10-02 NOTE — Progress Notes (Signed)
PROGRESS NOTE    Donna [name]  KXF:[id number] DOB: [birth year] DOA: 08/25/2019 PCP: Minette Brine, FNP   Brief Narrative:   Donna [name] an 69 y.o.femaleJehovah witness, with prior history of HTN, HLD, CVA (06/2018- right MCA territory stroke with residual mild left hemiparesis), multiple intracranial stenosis including anterior/ posterior circulation, DM, anemia, obesity, OSA, and arthritis.  Admitted by Neuro IR on 6/14 for cerebral angiogram s/p RT ICA cervical/ petrous junction balloon angioplasty for severe stenosis via right femoral approach. Was placed on ASA/ brillinta. Monitored in Neuro ICU. Noted to have developed hypotension, increased left sided weakness and lethargy on 6/15 am with Hgb drop from 13.3 to 6.9 with new AKI. Post MRI/ MRA showed scattered small acute infarcts along the right MCA/ watershed territory suspected to be resultant either post procedure vs hypoperfusion.   A CT abd/ pelvis was obtained given concern for retroperitoneal bleed which showed a mild to moderate amount of retroperitoneal hematoma in the right iliac and inguinal regiones with moderate size hematoma noted the the soft tissues anterior to the musculature of the right hip; no definite intrapertioneal hemorrhage. INR, PT, and platelets rechecked and were wnl. Vascular surgery was consulted with plans to observe given bleeding was mostly into the muscle and tissue.  Now became septic on IV antibiotics due to pneumonia.  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 Off precedex. Dense hemiplegia on left.  -Continue current nutrition throughcortrak.Avoid anticoagulation or antiplatelet given high risk of bleeding -plan for SNF versus LTAC -Discussed case with SLP as well as dietitian.  MBS performed today and this will determine if patient can remain on dysphagia diet.  We will try to decrease tube feeds to nocturnal if possible and try to stimulate appetite in a.m.  Hopefully core track can be weaned in the coming few days.  Acutemetabolicencephalopathy: Looks comfortable but too weak to have any sort of conversation. Unable to assess orientation. - cotninue seroquel at 143m QHS (has helped come off precedex) - check QTc prn - clonidine taper to off per pharmacy protocol - end date 09/27/19 (could have some rebound HTN)  Acute on chronic respiratory failure with hypoxia requiring mechanical ventilation.  -Failed trial of extubation due to upper airway stridor. Cords visibly scarred on reintubation. S/P trach 7/4. -getting intermittent Lasix doses  -Trach collar at 10 L from 8 L yesterday.  We will give 1 dose of IV Lasix today.  Volume Overload with hypertension:  -She has been getting Lasix doses intermittently based on her daily assessment.  -last dose of Lasix 40 mg IV on 7/17  - Goal to keep SBP<160  Acute blood loss anemia ->Anemia of critical illness : - Jehovah's Witness, no blood products including albumin.Hemoglobin improved to 6.8 today. Continue severe anemia protocol: Iron, Folic acid, vitamin BI96 Monitor off Epogen and Vit C that was previously given  Limit blood draws to every 4-7 days as needed  Expect slow recovery  Diarrhea:  -most likely due to tube feedings.  Uncontrolled diabetes with hyperglycemia:  -Blood sugar  controlled on current regimen.  obesity Body mass index is 35.41 kg/m.  Nutrition Status: Nutrition Problem: Inadequate oral intake Etiology: inability to eat Signs/Symptoms: NPO status Interventions: Prostat, Tube feeding   DVT prophylaxis:SCDs Code Status: Full Family Communication: None at bedside; will plan to call son Disposition Plan:   Status is: Inpatient  Remains inpatient appropriate because:Unsafe d/c plan   Dispo: The patient is from: Home  Anticipated d/c is to: LTAC vs SNF  Anticipated d/c date is: > 3 days  Patient currently is not medically stable to d/c.  Plan to try to wean patient off Cortrak  Consultants:   IR  PCCM  Neurology  Vascular  Palliative care  hematology  Procedures:   See above  Antimicrobials:  Anti-infectives (From admission, onward)   Start     Dose/Rate Route Frequency Ordered Stop   09/16/19 2130  vancomycin (VANCOCIN) IVPB 1000 mg/200 mL premix  Status:  Discontinued        1,000 mg 200 mL/hr over 60 Minutes Intravenous Every 12 hours 09/16/19 0839 09/18/19 0756   09/16/19 1630  piperacillin-tazobactam (ZOSYN) IVPB 3.375 g        3.375 g 12.5 mL/hr over 240 Minutes Intravenous Every 8 hours 09/16/19 0835 09/23/19 1153   09/16/19 1000  ceFEPIme (MAXIPIME) 1 g in sodium chloride 0.9 % 100 mL IVPB  Status:  Discontinued        1 g 200 mL/hr over 30 Minutes Intravenous Every 12 hours 09/16/19 0756 09/16/19 0803   09/16/19 0845  piperacillin-tazobactam (ZOSYN) IVPB 3.375 g        3.375 g 100 mL/hr over 30 Minutes Intravenous  Once 09/16/19 0834 09/16/19 1007   09/16/19 0845  vancomycin (VANCOREADY) IVPB 1500 mg/300 mL        1,500 mg 150 mL/hr over 120 Minutes Intravenous  Once 09/16/19 0839 09/16/19 1248   09/16/19 0830  piperacillin-tazobactam (ZOSYN) IVPB 3.375 g  Status:  Discontinued        3.375 g 12.5 mL/hr over 240 Minutes Intravenous Every 8 hours 09/16/19 0803  09/16/19 0834   09/16/19 0800  vancomycin (VANCOCIN) IVPB 1000 mg/200 mL premix  Status:  Discontinued        1,000 mg 200 mL/hr over 60 Minutes Intravenous Every 12 hours 09/16/19 0756 09/16/19 0839   08/31/19 1400  Ampicillin-Sulbactam (UNASYN) 3 g in sodium chloride 0.9 % 100 mL IVPB  Status:  Discontinued        3 g 200 mL/hr over 30 Minutes Intravenous Every 6 hours 08/31/19 1357 09/02/19 0819   08/25/19 0723  ceFAZolin (ANCEF) IVPB 2g/100 mL premix        2 g 200 mL/hr over 30 Minutes Intravenous 60 min pre-op 08/25/19 0723 08/25/19 0930       Subjective: Patient seen and evaluated today with no acute overnight events noted.  Trach collar is at 10 L this morning.  Objective: Vitals:   10/02/19 0400 10/02/19 0500 10/02/19 0744 10/02/19 0801  BP: (!) 150/63  (!) 150/63 (!) 150/63  Pulse:   95 (!) 105  Resp: 18   18  Temp:   99 F (37.2 C)   TempSrc:   Oral   SpO2: 100%   100%  Weight:  84.6 kg    Height:  Intake/Output Summary (Last 24 hours) at 10/02/2019 1120 Last data filed at 10/02/2019 0600 Gross per 24 hour  Intake 920 ml  Output 600 ml  Net 320 ml   Filed Weights   09/30/19 0335 10/01/19 0348 10/02/19 0500  Weight: 85 kg 83 kg 84.6 kg    Examination:  General exam: Appears calm and comfortable  Respiratory system: Clear to auscultation. Respiratory effort normal.  Trach collar at 10 L oxygen supplementation. Cardiovascular system: S1 & S2 heard, RRR. No JVD, murmurs, rubs, gallops or clicks. No pedal edema. Gastrointestinal system: Abdomen is nondistended, soft and nontender. No organomegaly or masses felt. Normal bowel sounds heard.  Core track tube feeds ongoing. Central nervous system: Somnolent but arousable Extremities: No edema Skin: No rashes, lesions or ulcers Psychiatry: Cannot be adequately assessed.    Data Reviewed: I have personally reviewed following labs and imaging studies  CBC: Recent Labs  Lab 09/26/19 0257  WBC 11.3*   HGB 6.8*  HCT 22.5*  MCV 101.8*  PLT 308   Basic Metabolic Panel: Recent Labs  Lab 09/26/19 0537  NA 141  K 3.8  CL 106  CO2 30  GLUCOSE 141*  BUN 17  CREATININE 0.58  CALCIUM 8.7*  MG 1.7  PHOS 3.1   GFR: Estimated Creatinine Clearance: 66.4 mL/min (by C-G formula based on SCr of 0.58 mg/dL). Liver Function Tests: Recent Labs  Lab 09/26/19 0537  AST 31  ALT 42  ALKPHOS 57  BILITOT 0.5  PROT 5.5*  ALBUMIN 1.8*   No results for input(s): LIPASE, AMYLASE in the last 168 hours. No results for input(s): AMMONIA in the last 168 hours. Coagulation Profile: No results for input(s): INR, PROTIME in the last 168 hours. Cardiac Enzymes: No results for input(s): CKTOTAL, CKMB, CKMBINDEX, TROPONINI in the last 168 hours. BNP (last 3 results) No results for input(s): PROBNP in the last 8760 hours. HbA1C: No results for input(s): HGBA1C in the last 72 hours. CBG: Recent Labs  Lab 10/01/19 1607 10/01/19 1933 10/01/19 2335 10/02/19 0351 10/02/19 0742  GLUCAP 142* 125* 141* 136* 121*   Lipid Profile: No results for input(s): CHOL, HDL, LDLCALC, TRIG, CHOLHDL, LDLDIRECT in the last 72 hours. Thyroid Function Tests: No results for input(s): TSH, T4TOTAL, FREET4, T3FREE, THYROIDAB in the last 72 hours. Anemia Panel: No results for input(s): VITAMINB12, FOLATE, FERRITIN, TIBC, IRON, RETICCTPCT in the last 72 hours. Sepsis Labs: No results for input(s): PROCALCITON, LATICACIDVEN in the last 168 hours.  Recent Results (from the past 240 hour(s))  MRSA PCR Screening     Status: None   Collection Time: 09/25/19  5:24 AM   Specimen: Nasopharyngeal  Result Value Ref Range Status   MRSA by PCR NEGATIVE NEGATIVE Final    Comment:        The GeneXpert MRSA Assay (FDA approved for NASAL specimens only), is one component of a comprehensive MRSA colonization surveillance program. It is not intended to diagnose MRSA infection nor to guide or monitor treatment for MRSA  infections. Performed at [facility], [place] [address] [place], [place] [address]          Radiology Studies: No results found.      Scheduled Meds: . amLODipine  10 mg Per Tube Daily  . atorvastatin  40 mg Per NG tube Daily  . chlorhexidine gluconate (MEDLINE KIT)  15 mL Mouth Rinse BID  . Chlorhexidine Gluconate Cloth  6 each Topical Daily  . famotidine  20 mg Per Tube  BID  . feeding supplement (PROSource TF)  45 mL Per Tube TID  . ferrous sulfate  220 mg Per Tube TID WC  . hydrALAZINE  100 mg Per Tube Q6H  . hydrocerin   Topical TID  . insulin aspart  0-15 Units Subcutaneous Q4H  . insulin aspart  4 Units Subcutaneous Q4H  . insulin glargine  16 Units Subcutaneous Daily  . mouth rinse  15 mL Mouth Rinse 10 times per day  . QUEtiapine  100 mg Per Tube QHS  . sodium chloride flush  10-40 mL Intracatheter Q12H   Continuous Infusions: . sodium chloride 10 mL/hr at 09/25/19 1800  . feeding supplement (JEVITY 1.2 CAL) 1,000 mL (10/02/19 0510)     LOS: 38 days    Time spent: 30 minutes    [place] Jr Darleen Crocker, DO [facility]  If 7PM-7AM, please contact night-coverage [email or web address] 10/02/2019, 11:20 AM

## 2019-10-02 NOTE — Progress Notes (Signed)
Modified Barium Swallow Progress Note  Patient Details  Name: Donna [name] MRN: [id number] Date of Birth: 1950/05/05  Today's Date: 10/02/2019  Modified Barium Swallow completed.  Full report located under Chart Review in the Imaging Section.  Brief recommendations include the following:  Clinical Impression  [name] demonstrates a mild pharyngeal dysphagia. Orally, all consistencies tested were WNL, however, pt deferred regular solid trials secondary to fatigue. Pharyngeally, pt was noted with reduced hyoid excursion, laryngeal elevation, and laryngeal vestibule closure, which resulted in trace deep penetration of thin liquids before the swallow. Oral prep phase was trialed as compensation, however, was ineffective. Pt was also unable to effectively utilize chin tuck strategy to prevent penetration. Given nectar thick liquids, she had no penetration or aspiration via cup or straw. Straw use may be preferred as pt requires full assist for feeding and straw allows her to control bolus size. She consumed purees with good pharyngeal clearance/adequate pharyngeal constriction. Esophageal phase could not be viewed due to patient positioning and size.   Recommend: nectar thick liquids via straw, purees (dysphagia 1), slow rate of feeding, meds crushed in puree, ongoing dysphagia treatment   Swallow Evaluation Recommendations   Recommended Consults: Consider ENT evaluation for stridor with use of PMSV   SLP Diet Recommendations: Nectar thick liquid;Dysphagia 1 (Puree) solids   Liquid Administration via: Cup;Straw   Medication Administration: Crushed with puree   Supervision: Staff to assist with self feeding;Full assist for feeding   Compensations: Small sips/bites   Postural Changes: Remain semi-upright after after feeds/meals (Comment);Seated upright at 90 degrees   Oral Care Recommendations: Oral care QID   Other Recommendations: Order thickener from pharmacy   [place]ah [place] P.  Demeisha Geraghty, M.S., CCC-SLP Speech-Language Pathologist Acute Rehabilitation Services Pager: [phone]  Doloros Kwolek P Dawit Tankard 10/02/2019,11:42 AM

## 2019-10-02 NOTE — TOC Progression Note (Signed)
Transition of Care [facility]) - Progression Note    Patient Details  Name: Donna [name] MRN: [id number] Date of Birth: 10-Sep-1950  Transition of [facility]) CM/SW Contact  Mearl Latin, LCSW Phone Number: 10/02/2019, 9:22 AM  Clinical Narrative:    [facility]) has received appeal denial from insurance. Patient's next option will be SNF but SNFs are unable to accept patient's with Cortrak. Will continue to follow and update family.    Expected Discharge Plan: Long Term Acute Care (LTAC) Barriers to Discharge: Insurance Authorization  Expected Discharge Plan and Services Expected Discharge Plan: Long Term Acute Care (LTAC)                                               Social Determinants of Health (SDOH) Interventions    Readmission Risk Interventions No flowsheet data found.

## 2019-10-02 NOTE — Progress Notes (Signed)
Nutrition Follow-up  DOCUMENTATION CODES:   Not applicable  INTERVENTION:   Transition to nocturnal tube feeding to stimulate appetite during the day: - Jevity 1.2 @ 75 ml/hr for 12 hours from 1800 to 0600 (total of 900 ml) - Continue ProSource TF 45 ml TID  Nocturnal tube feeding regimen provides 1200 kcal, 83 grams of protein, and 726 ml of H2O (77% of kcal needs, 92% of protein needs).  - Magic cup TID with meals, each supplement provides 290 kcal and 9 grams of protein  - Vital Cuisine Shake TID, each supplement provides 520 kcal and 22 grams of protein  NUTRITION DIAGNOSIS:   Inadequate oral intake related to inability to eat as evidenced by NPO status.  Progressing, diet advanced to dysphagia 1 with nectar-thick liquids  GOAL:   Patient will meet greater than or equal to 90% of their needs  Progressing  MONITOR:   PO intake, Supplement acceptance, Diet advancement, Labs, Weight trends, TF tolerance  REASON FOR ASSESSMENT:   Consult Enteral/tube feeding initiation and management  ASSESSMENT:   Pt who is a Jehovah witness with PMH of HTN, HLD, R MCA CVA 06/2018, DM, anemia, obesity, OSA admitted 6/14 for cerebral angiogram s/p R ICA angioplasty for severe stenosis.  6/14-s/p balloon angioplasty for severe stenosis,pt developed lethargy and increased L sided weakness found to have new watershed infarcts,started on vasopressors for cerebral perfusion 6/16-Cortrak placed,tip in stomach 6/17-pt with increased agitationrequiring increased O2 6/18 - pt pulled out Cortrak, Cortrak replaced 6/19 - Cortrak removed 6/20 - intubated 6/23 - Cortrak replaced (tip gastric per Cortrak team), extubated, later reintubated 7/03 - s/p trach 7/22 - MBS, diet advanced to dysphagia 1 with nectar-thick liquids  Discussed pt with MD. Diet advanced from NPO to dysphagia 1 with nectar-thick liquids after MBS today. Plan is to transition pt to nocturnal tube feeds via Cortrak  to stimulate appetite during the day. Discussed plan with RN.  Unsure if pt will be able to meet nutritional needs via PO intake alone. If not, recommend considering long-term enteral access (PEG).  Weight stable over the last few days but up about 16 lbs since first measured weight on 08/30/19. Per RN edema assessment, pt with mild pitting edema to BLE. Will continue to monitor weight trends.  Medications reviewed and include: pepcid, ferrous sulfate, SSI q 4 hours, Novolog 4 units q 4 hours, Lantus 16 units daily  Labs reviewed. CBG's: 121-193 x 24 hours  Diet Order:   Diet Order            DIET - DYS 1 Room service appropriate? No; Fluid consistency: Nectar Thick  Diet effective now                 EDUCATION NEEDS:   Not appropriate for education at this time  Skin:  Skin Assessment: Skin Integrity Issues: Incisions: R groin Other: MASD to buttocks, coccyx  Last BM:  10/01/19  Height:   Ht Readings from Last 1 Encounters:  08/31/19 5\' 1"  (1.549 m)    Weight:   Wt Readings from Last 1 Encounters:  10/02/19 84.6 kg    Ideal Body Weight:  47.7 kg  BMI:  Body mass index is 35.24 kg/m.  Estimated Nutritional Needs:   Kcal:  1550-1750  Protein:  90-105 grams  Fluid:  >1.6 L/day    10/04/19, MS, RD, LDN Inpatient Clinical Dietitian Please see AMiON for contact information.

## 2019-10-03 ENCOUNTER — Encounter (HOSPITAL_COMMUNITY): Payer: Self-pay

## 2019-10-03 ENCOUNTER — Telehealth: Payer: Medicare HMO

## 2019-10-03 LAB — GLUCOSE, CAPILLARY
Glucose-Capillary: 123 mg/dL — ABNORMAL HIGH (ref 70–99)
Glucose-Capillary: 130 mg/dL — ABNORMAL HIGH (ref 70–99)
Glucose-Capillary: 131 mg/dL — ABNORMAL HIGH (ref 70–99)
Glucose-Capillary: 153 mg/dL — ABNORMAL HIGH (ref 70–99)
Glucose-Capillary: 188 mg/dL — ABNORMAL HIGH (ref 70–99)
Glucose-Capillary: 209 mg/dL — ABNORMAL HIGH (ref 70–99)
Glucose-Capillary: 212 mg/dL — ABNORMAL HIGH (ref 70–99)
Glucose-Capillary: 242 mg/dL — ABNORMAL HIGH (ref 70–99)
Glucose-Capillary: 58 mg/dL — ABNORMAL LOW (ref 70–99)

## 2019-10-03 LAB — HEMOGLOBIN A1C
Hgb A1c MFr Bld: 5.2 % (ref 4.8–5.6)
Mean Plasma Glucose: 102.54 mg/dL

## 2019-10-03 MED ORDER — DEXTROSE 50 % IV SOLN
50.0000 mL | Freq: Once | INTRAVENOUS | Status: AC
  Administered 2019-10-03: 50 mL via INTRAVENOUS

## 2019-10-03 MED ORDER — FUROSEMIDE 10 MG/ML IJ SOLN
40.0000 mg | Freq: Once | INTRAMUSCULAR | Status: AC
  Administered 2019-10-03: 40 mg via INTRAVENOUS
  Filled 2019-10-03: qty 4

## 2019-10-03 MED ORDER — INSULIN GLARGINE 100 UNIT/ML ~~LOC~~ SOLN
8.0000 [IU] | Freq: Every day | SUBCUTANEOUS | Status: DC
  Administered 2019-10-04 – 2019-10-05 (×2): 8 [IU] via SUBCUTANEOUS
  Filled 2019-10-03 (×2): qty 0.08

## 2019-10-03 MED ORDER — INSULIN ASPART 100 UNIT/ML ~~LOC~~ SOLN
0.0000 [IU] | SUBCUTANEOUS | Status: DC
  Administered 2019-10-03 – 2019-10-04 (×3): 2 [IU] via SUBCUTANEOUS

## 2019-10-03 MED ORDER — DEXTROSE 50 % IV SOLN
INTRAVENOUS | Status: AC
  Filled 2019-10-03: qty 50

## 2019-10-03 NOTE — Progress Notes (Signed)
Referring Physician(s): Sarina Ill  Supervising Physician: Luanne Bras  Patient Status:  [facility] - In-pt  Chief Complaint: Follow up right ICA cervical/petrous junction stenosis s/p angioplasty 08/25/19 with post procedure right thigh/retroperitoneal hematoma and acute blood loss anemia   Subjective:  Laying in bed, awake, alert, follows commands, still with left sided neglect. Tracheostomy and cortrak present. No family/staff at bedside during visit.  Allergies: Patient has no known allergies.  Medications: Prior to Admission medications   Medication Sig Start Date End Date Taking? Authorizing Provider  amLODipine (NORVASC) 10 MG tablet Take 1 tablet (10 mg total) by mouth daily. 05/12/19  Yes Minette Brine, FNP  aspirin EC 81 MG tablet Take 81 mg by mouth every evening.   Yes [provider]  Calcium Carbonate-Vitamin D (CALCIUM-D PO) Take 2 tablets by mouth daily at 12 noon.    Yes [provider]  carvedilol (COREG) 6.25 MG tablet TAKE 1 TABLET(6.25 MG) BY MOUTH TWICE DAILY Patient taking differently: Take 6.25 mg by mouth 2 (two) times daily with a meal.  07/31/19  Yes Minette Brine, FNP  hydrALAZINE (APRESOLINE) 50 MG tablet Take 1 tablet (50 mg total) by mouth 2 (two) times daily. 06/16/19 09/14/19 Yes Elouise Munroe, MD  Insulin Degludec-Liraglutide (XULTOPHY) 100-3.6 UNIT-MG/ML SOPN Inject 30 Units into the skin daily. 08/14/19  Yes Minette Brine, FNP  telmisartan-hydrochlorothiazide (MICARDIS HCT) 40-12.5 MG tablet Take 1 tablet by mouth daily. 07/31/19  Yes Minette Brine, FNP  ticagrelor (BRILINTA) 90 MG TABS tablet Take 1 tablet (90 mg total) by mouth 2 (two) times daily. 06/12/19  Yes Elouise Munroe, MD  vitamin C (ASCORBIC ACID) 250 MG tablet Take 500 mg by mouth daily at 12 noon.   Yes [provider]  blood glucose meter kit and supplies KIT Dispense based on patient and insurance preference. Use up to four times daily as directed. (FOR  ICD-9 250.00, 250.01). 05/10/18   Minette Brine, FNP  Blood Glucose Monitoring Suppl (TRUE METRIX METER) w/Device KIT 1 each by Does not apply route in the morning, at noon, in the evening, and at bedtime. 05/22/19   Minette Brine, FNP  Evolocumab (REPATHA SURECLICK) 212 MG/ML SOAJ Inject 140 mg into the skin every 14 (fourteen) days. 06/30/19   Elouise Munroe, MD  glucose blood (TRUE METRIX BLOOD GLUCOSE TEST) test strip Check blood sugar 4 times a day before meals and bedtime 05/22/19   Minette Brine, FNP  Insulin Glargine-Lixisenatide (SOLIQUA) 100-33 UNT-MCG/ML SOPN Inject 25 Units into the skin daily. Patient taking differently: Inject 30 Units into the skin daily.  07/14/19   Minette Brine, FNP  Multiple Vitamin (MULTIVITAMIN PO) Take 1 tablet by mouth daily at 12 noon.     [provider]  Omega-3 Fatty Acids (OMEGA-3 PLUS PO) Take 2 capsules by mouth daily at 12 noon.    [provider]     Vital Signs: BP (!) 167/69   Pulse 103   Temp 99 F (37.2 C) (Axillary)   Resp 21   Ht _0  (1.549 m)   Wt 187 lb 9.8 oz (85.1 kg)   SpO2 100%   BMI 35.45 kg/m   Physical Exam Vitals and nursing note reviewed.  Constitutional:      General: She is not in acute distress.    Appearance: She is ill-appearing.  Cardiovascular:     Rate and Rhythm: Tachycardia present.  Pulmonary:     Comments: (+) tracheostomy Abdominal:  Palpations: Abdomen is soft.  Skin:    General: Skin is warm and dry.  Neurological:     Mental Status: She is alert.   Alert, awake - states correct year, unable to answer location/month Minimal speech, comprehension appears grossly in tact - nods yes/no appropriately and follows commands PERRL bilaterally EOMs without obvious nystagmus Visual fields not assessed No obvious facial asymmetry. Tongue midline - not assessed Motor power - left sided neglect, full on right side Pronator drift - not assessed. Fine motor and coordination - grossly  in tact on right only Gait - not assessed Romberg - not assessed Heel to toe - not assessed Distal pulses - not assessed   Imaging: DG Chest 1 View  Result Date: 10/02/2019 CLINICAL DATA:  Hypoxemia EXAM: CHEST  1 VIEW COMPARISON:  09/22/2019 FINDINGS: Feeding catheter, tracheostomy tube and right-sided PICC line are again noted stable. Cardiac shadow is stable. Aortic calcifications are again seen. Previously seen lung opacities have improved over the interval from the prior exam. No sizable effusion is noted. IMPRESSION: Improved aeration bilaterally. Tubes and lines as described. Electronically Signed   By: Inez Catalina M.D.   On: 10/02/2019 11:37   DG Swallowing Func-Speech Pathology  Result Date: 10/02/2019 Objective Swallowing Evaluation: Type of Study: Bedside Swallow Evaluation  Patient Details Name: Donna [name] MRN: [id number] Date of Birth: September 13, 1950 Today's Date: 10/02/2019 Time: SLP Start Time (ACUTE ONLY): 2956 -SLP Stop Time (ACUTE ONLY): 1104 SLP Time Calculation (min) (ACUTE ONLY): 24 min Past Medical History: Past Medical History: Diagnosis Date . Anemia   low iron - not since having fibroids removed . Arthritis  . Hyperlipidemia  . Hypertension  . Sleep apnea   just recently diagnosed with it, has not gotten the Cpap (done 10/29/18) . Stroke [facility])   weakness on left side . Type II diabetes mellitus ([place])  Past Surgical History: Past Surgical History: Procedure Laterality Date . COLONOSCOPY   . IR ANGIO INTRA EXTRACRAN SEL COM CAROTID INNOMINATE BILAT MOD SED  10/18/2018 . IR ANGIO INTRA EXTRACRAN SEL COM CAROTID INNOMINATE BILAT MOD SED  08/18/2019 . IR ANGIO VERTEBRAL SEL SUBCLAVIAN INNOMINATE UNI R MOD SED  08/18/2019 . IR ANGIO VERTEBRAL SEL VERTEBRAL BILAT MOD SED  10/18/2018 . IR ANGIO VERTEBRAL SEL VERTEBRAL UNI L MOD SED  08/18/2019 . IR PTA INTRACRANIAL  08/25/2019 . IR US GUIDE VASC ACCESS RIGHT  10/18/2018 . IR US GUIDE VASC ACCESS RIGHT  08/18/2019 . IR US GUIDE VASC ACCESS RIGHT   08/25/2019 . MYOMECTOMY   . RADIOLOGY WITH ANESTHESIA N/A 08/18/2019  Procedure: STENTING;  Surgeon: Luanne Bras, MD;  Location: [place];  Service: Radiology;  Laterality: N/A; . RADIOLOGY WITH ANESTHESIA N/A 08/25/2019  Procedure: STENTING;  Surgeon: Luanne Bras, MD;  Location: [place];  Service: Radiology;  Laterality: N/A; HPI: 69 y.o. female with PMH significant for HTN, HLD, CVA 06/2018, DM, anemia, obesity, OSA, and OA. Pt underwent R ICA angioplasty on 6/14. Pt with decline in neuro status and hemoglobin on 6/15, found to have a large R groin hematoma.  Pt has had Hbg 5.0 or less and is a Jehovah's witness. MRI showed scattered small acute infarcts along the right MCA/watershed territory. Solitary small acute infarct in the left parietal cortex and right cerebellum. She was intubated on 6/19. Pt underwent tracheostomy on 7/3. PT found to be septic with RLL PNA on 7/6. As of 10/01/19, not tolerating PMSV, noted stridor and air stacking despite now being in [place] #6  uncuffed. 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  Subjective: pt alert, upright in MBS chair, agreeable to evaluation Assessment / Plan / Recommendation CHL IP CLINICAL IMPRESSIONS 10/02/2019 Clinical Impression Ms. Winker demonstrates a mild pharyngeal dysphagia. She was noted to be fatigued after just having an x-ray completed and initially had high RR (32), which given time lowered to 18, more consistent with her baseline. Orally, all consistencies tested were WNL, however, pt deferred regular solid trials secondary to fatigue. Pharyngeally, pt was noted with reduced hyoid excursion, laryngeal elevation, and laryngeal vestibule closure, which resulted in trace deep penetration of thin liquids before the swallow. Oral prep phase was trialed as compensation, however, was ineffective. Pt was also unable to effectively utilize chin tuck strategy to prevent penetration. Given nectar thick liquids, she had no penetration or  aspiration via cup or straw. Straw use may be preferred as pt requires full assist for feeding and straw allows her to control bolus size. She consumed purees with good pharyngeal clearance/adequate pharyngeal constriction. Esophageal phase could not be viewed due to patient positioning and size. Recommend: nectar thick liquids via straw, purees (dysphagia 1), slow rate of feeding, meds crushed in puree, ongoing dysphagia treatment SLP Visit Diagnosis Dysphagia, unspecified (R13.10) Impact on safety and function Mild aspiration risk   CHL IP TREATMENT RECOMMENDATION 10/02/2019 Treatment Recommendations F/U FEES in --- days (Comment)   Prognosis 10/02/2019 Prognosis for Safe Diet Advancement Good Barriers to Reach Goals Cognitive deficits Barriers/Prognosis Comment -- CHL IP DIET RECOMMENDATION 10/02/2019 SLP Diet Recommendations Nectar thick liquid;Dysphagia 1 (Puree) solids Liquid Administration via Cup;Straw Medication Administration Crushed with puree Compensations Small sips/bites Postural Changes Remain semi-upright after after feeds/meals (Comment);Seated upright at 90 degrees   CHL IP OTHER RECOMMENDATIONS 10/02/2019 Recommended Consults Consider ENT evaluation Oral Care Recommendations Oral care QID Other Recommendations Order thickener from pharmacy   CHL IP FOLLOW UP RECOMMENDATIONS 10/02/2019 Follow up Recommendations Skilled Nursing facility;LTACH;24 hour supervision/assistance   CHL IP FREQUENCY AND DURATION 10/02/2019 Speech Therapy Frequency (ACUTE ONLY) min 2x/week Treatment Duration 2 weeks      CHL IP ORAL PHASE 10/02/2019 Oral Phase WFL  CHL IP PHARYNGEAL PHASE 10/02/2019 Pharyngeal Phase Impaired Pharyngeal- Nectar Teaspoon WFL Pharyngeal -- Pharyngeal- Nectar Cup WFL Pharyngeal -- Pharyngeal- Nectar Straw WFL Pharyngeal -- Pharyngeal- Thin Teaspoon Reduced epiglottic inversion;Reduced laryngeal elevation;Reduced airway/laryngeal closure;Penetration/Aspiration before swallow Pharyngeal -- Pharyngeal-  Thin Cup Reduced epiglottic inversion;Reduced laryngeal elevation;Reduced anterior laryngeal mobility;Reduced airway/laryngeal closure;Penetration/Aspiration before swallow Pharyngeal -- Pharyngeal- Thin Straw Reduced epiglottic inversion;Reduced anterior laryngeal mobility;Reduced laryngeal elevation;Reduced airway/laryngeal closure;Penetration/Aspiration before swallow Pharyngeal -- Pharyngeal- Puree WFL  CHL IP CERVICAL ESOPHAGEAL PHASE 10/02/2019 Cervical Esophageal Phase [facility] [place] P. Isenhour, M.S., CCC-SLP Speech-Language Pathologist Acute Rehabilitation Services Pager: [place] 10/02/2019, 11:32 AM               Labs:  CBC: Recent Labs    09/17/19 0703 09/18/19 0923 09/20/19 0500 09/26/19 0257  WBC 17.5* 15.3* 10.4 11.3*  HGB 5.6* 4.7* 4.7* 6.8*  HCT 19.0* 16.4* 17.5* 22.5*  PLT 448* 435* 447* 247    COAGS: Recent Labs    11/27/18 0728 08/18/19 0705 08/26/19 1715 08/27/19 0230  INR 0.9 0.9 1.1 1.2    BMP: Recent Labs    09/18/19 0923 09/20/19 0500 09/21/19 0500 09/26/19 0537  NA 142 144 142 141  K 4.4 3.1* 3.3* 3.8  CL 103 104 103 106  CO2 _0 GLUCOSE 151* 181* 145* 141*  BUN  42* 47* 41* 17  CALCIUM 8.7* 8.5* 8.6* 8.7*  CREATININE 1.01* 0.97 0.81 0.58  GFRNONAA 57* >60 >60 >60  GFRAA >60 >60 >60 >60    LIVER FUNCTION TESTS: Recent Labs    09/17/19 0703 09/20/19 0500 09/21/19 0500 09/26/19 0537  BILITOT 1.1 0.7 0.3 0.5  AST 32 _0 ALT 23 29 36 42  ALKPHOS 82 95 81 57  PROT 6.6 7.4 7.1 5.5*  ALBUMIN 1.6* 1.5* 1.6* 1.8*    Assessment and Plan:  69 y/o F s/p right ICA cervical/petrous junction stenosis balloon angioplasty 08/25/19 with post procedure right thigh/retroperitoneal hematoma and acute blood loss anemia with refusal of blood products (Jehovah's Witness) seen today for routine follow up.  No noticeable neuro change since 7/21 assessment - continues to have left sided neglect, follows commands  appropriately, able to say a few words. Last hgb 6.8 (7/16), no overt bleeding noted per staff.   For possible LTAC/SNF placement - pending further swallow evals for possible removal of cortrak.   Continue to hold anticoagulation/antiplatelet medications. Further plans per TRH/neurology - appreciate and agree with management. IR will continue to follow, please call with any questions or concerns.  Electronically Signed: Joaquim Nam, PA-C 10/03/2019, 12:45 PM   I spent a total of 15 Minutes at the the patient's bedside AND on the patient's hospital floor or unit, greater than 50% of which was counseling/coordinating care for follow up right ICA angioplasty/post procedure hematoma/acute blood loss anemia.

## 2019-10-03 NOTE — TOC Progression Note (Addendum)
Transition of Care [facility]) - Progression Note    Patient Details  Name: Donna [name] MRN: [id number] Date of Birth: [birth year]  [facility] CM/SW Contact  Nonda Lou, [place] Phone Number: 10/03/2019, 2:55 PM  Clinical Narrative:    CSW contacted patient's son Evaristo Bury to provide an update on insurance authorization being declined for LTAC. CSW explained SNF would be the next level of care. CSW informed Mr. Evaristo Bury that patient is unable to go to a SNF with a cortrak. Patient's son expressed he would like to speak with family and look into SNFs before making any additional decisions on discharge. CSW expressed understanding and agreed to follow-up the beginning of the week. CSW explained insurance authorization process for SNF placement.  Expected Discharge Plan: Long Term Acute Care (LTAC) Barriers to Discharge: Insurance Authorization  Expected Discharge Plan and Services Expected Discharge Plan: Long Term Acute Care (LTAC)                                               Social Determinants of Health (SDOH) Interventions    Readmission Risk Interventions No flowsheet data found.

## 2019-10-03 NOTE — Progress Notes (Signed)
Palliative Medicine RN Note: Patient discussed in team rounds. Goals have remained very clear, and there is no further role for PMT engagement at this time.  We will sign off. If new concerns arise, or if Mrs Rochefort family asks to speak to [place], please re-consult [place].  Margret Chance Laiyah Exline, RN, BSN, [facility] Palliative Medicine Team 10/03/2019 9:53 AM Office [phone]

## 2019-10-03 NOTE — Progress Notes (Signed)
  Speech Language Pathology Treatment: Dysphagia;Passy Muir Speaking valve  Patient Details Name: Donna [name] MRN: [id number] DOB: February 13, 1951 Today's Date: 10/03/2019 Time: 3810-1751 SLP Time Calculation (min) (ACUTE ONLY): 18 min  Assessment / Plan / Recommendation Clinical Impression  Pt appears to have marginally improved toleration of PMV today since last SLP session.  She demonstrated improved access to upper airway with fewer signs of air trapping upon removal of PMV.  Valve placed for intervals of 4-5 respiratory cycles - pt achieved voice with low volume, expiratory stridorous sounds.  Spontaneous speech was limited to 1-2 word utterances.  Stated name, month ("April"), correct year and location, reason of hospitalization as "observation." Required intermittent verbal cues throughout session to initiate speech, increase volume when able. HR was 112-119, RR low 30s throughout, Sp02 >94%.  Use of valve continued to elicit coughing with ejection of valve from hub, thick secretions expelled.  After trials of PMV ceased, pt agreed to trying some POs from meal tray at bedside. She consumed half a container of nectar juice with adequate interest/attention, no overt s/s of aspiration.  She declined further POs.  Cortrak feeds ongoing but nocturnal at this point.  Continue to encourage PO intake - PMV DOES NOT NEED TO BE WORN WHEN EATING/DRINKING. SLP will follow for communication/swallowing.    HPI HPI: 69 y.o. female with PMH significant for HTN, HLD, CVA 06/2018, DM, anemia, obesity, OSA, and OA. Pt underwent R ICA angioplasty on 6/14. Pt with decline in neuro status and hemoglobin on 6/15, found to have a large R groin hematoma.  Pt has had Hbg 5.0 or less and is a Jehovah's witness. MRI showed scattered small acute infarcts along the right MCA/watershed territory. Solitary small acute infarct in the left parietal cortex and right cerebellum. She was intubated on 6/19. Pt underwent tracheostomy  on 7/3. PT found to be septic with RLL PNA on 7/6.  MBS 7/22 - started on dys1/nectars.      SLP Plan  Continue with current plan of care       Recommendations  Diet recommendations: Dysphagia 1 (puree);Nectar-thick liquid Liquids provided via: Cup;Straw Medication Administration: Via alternative means Supervision: Staff to assist with self feeding Compensations: Small sips/bites Postural Changes and/or Swallow Maneuvers: Seated upright 90 degrees      Patient may use Passy-Muir Speech Valve: with SLP only         Oral Care Recommendations: Oral care BID SLP Visit Diagnosis: Dysphagia, pharyngeal phase (R13.13) Plan: Continue with current plan of care       GO                Donna [name] [name] 10/03/2019, 11:54 AM  Donna [name] L. Donna Frederic, MA CCC/SLP Acute Rehabilitation Services Office number [phone] Pager [phone]

## 2019-10-03 NOTE — Progress Notes (Signed)
Occupational Therapy Treatment Patient Details Name: Donna [name] MRN: [id number] DOB: 07-28-1950 Today's Date: 10/03/2019    History of present illness 69 y.o. female with PMH significant for HTN, HLD, CVA 06/2018, DM, anemia, obesity, OSA, and OA. She is [facility] to [facility] and has been followed by Dr. Corliss Skains since 10/2018. Pt underwent R ICA angioplasty on 6/14. Pt with decline in neuro status and hemoglobin on 6/15, found to have a large R groin hematoma.  Pt has had Hbg 5.0 or less and is a Jehovah's witness.  She was intubated on 6/19. Pt underwent tracheostomy on 7/3. PT found to be septic with RLL PNA on 7/6. 7/13 trach tolerating extubation   OT comments  Patient supine in bed on arrival.  On 5L O2 via trach.  Patient very drowsy and difficult to engage. Session focused on increasing attention to L side and scanning to left.  Therapist frequently positioned on L side and having patient find her.  Completed face wash at bed level with R hand and patient not able to wash L side.  Did PROM exercises with L UE and examined for skin breakdown from splint, skin intact.  Will continue to follow with OT acutely to address the deficits listed below.    Follow Up Recommendations  SNF    Equipment Recommendations  Wheelchair cushion (measurements OT);Wheelchair (measurements OT);Hospital bed;Other (comment)    Recommendations for Other Services      Precautions / Restrictions Precautions Precautions: Fall Precaution Comments: trach, low hbg       Mobility Bed Mobility                  Transfers                      Balance                                           ADL either performed or assessed with clinical judgement   ADL       Grooming: Wash/dry face;Minimal assistance;Bed level Grooming Details (indicate cue type and reason): Able to reach to forehead, difficulty reaching to L side of face                                General ADL Comments: Working on scanning and L side attention     Vision       Perception     Praxis      Cognition Arousal/Alertness: Lethargic Behavior During Therapy: Flat affect Overall Cognitive Status: Difficult to assess Area of Impairment: Attention;Following commands;Problem solving                   Current Attention Level: Sustained   Following Commands: Follows one step commands with increased time Safety/Judgement: Decreased awareness of safety;Decreased awareness of deficits Awareness: Intellectual Problem Solving: Slow processing;Requires verbal cues;Decreased initiation General Comments: patient with improved command following today approximately 75% of the time, but very fatigued        Exercises Exercises: General Upper Extremity General Exercises - Upper Extremity Shoulder Flexion: PROM;Left;10 reps;Supine Shoulder Extension: PROM;Left;10 reps;Supine;AROM;Right Elbow Flexion: PROM;Left;10 reps;Supine Elbow Extension: PROM;Both;10 reps;AROM   Shoulder Instructions       General Comments 5L O2 via trach. Check L hand/wrist and no skin breakdown from splint  Pertinent Vitals/ Pain       Pain Assessment: Faces Faces Pain Scale: No hurt  Home Living                                          Prior Functioning/Environment              Frequency  Min 2X/week        Progress Toward Goals  OT Goals(current goals can now be found in the care plan section)  Progress towards OT goals: Progressing toward goals  Acute Rehab OT Goals Patient Stated Goal: none stated OT Goal Formulation: Patient unable to participate in goal setting Time For Goal Achievement: 10/16/19 Potential to Achieve Goals: Fair  Plan Discharge plan remains appropriate    Co-evaluation                 AM-PAC OT "6 Clicks" Daily Activity     Outcome Measure   Help from another person eating meals?: Total Help from another person  taking care of personal grooming?: A Lot Help from another person toileting, which includes using toliet, bedpan, or urinal?: Total Help from another person bathing (including washing, rinsing, drying)?: Total Help from another person to put on and taking off regular upper body clothing?: Total Help from another person to put on and taking off regular lower body clothing?: Total 6 Click Score: 7    End of Session Equipment Utilized During Treatment: Oxygen  OT Visit Diagnosis: Muscle weakness (generalized) (M62.81);Cognitive communication deficit (R41.841);Hemiplegia and hemiparesis Symptoms and signs involving cognitive functions: Cerebral infarction Hemiplegia - Right/Left: Left Hemiplegia - dominant/non-dominant: Non-Dominant Hemiplegia - caused by: Cerebral infarction   Activity Tolerance Patient limited by fatigue   Patient Left in bed;with call bell/phone within reach;with bed alarm set;with SCD's reapplied   Nurse Communication Mobility status        Time: 3149-7026 OT Time Calculation (min): 13 min  Charges: OT General Charges $OT Visit: 1 Visit OT Treatments $Therapeutic Activity: 8-22 mins  Barbie Banner, OTR/L    Adella Hare 10/03/2019, 2:49 PM

## 2019-10-03 NOTE — Progress Notes (Signed)
PROGRESS NOTE    Donna [name]  OZH:[id number] DOB: [birth year] DOA: 08/25/2019 PCP: Minette Brine, FNP   Brief Narrative:   [name] an 69 y.o.femaleJehovah witness, with prior history of HTN, HLD, CVA (06/2018- right MCA territory stroke with residual mild left hemiparesis), multiple intracranial stenosis including anterior/ posterior circulation, DM, anemia, obesity, OSA, and arthritis.  Admitted by Neuro IR on 6/14 for cerebral angiogram s/p RT ICA cervical/ petrous junction balloon angioplasty for severe stenosis via right femoral approach. Was placed on ASA/ brillinta. Monitored in Neuro ICU. Noted to have developed hypotension, increased left sided weakness and lethargy on 6/15 am with Hgb drop from 13.3 to 6.9 with new AKI. Post MRI/ MRA showed scattered small acute infarcts along the right MCA/ watershed territory suspected to be resultant either post procedure vs hypoperfusion.   A CT abd/ pelvis was obtained given concern for retroperitoneal bleed which showed a mild to moderate amount of retroperitoneal hematoma in the right iliac and inguinal regiones with moderate size hematoma noted the the soft tissues anterior to the musculature of the right hip; no definite intrapertioneal hemorrhage. INR, PT, and platelets rechecked and were wnl. Vascular surgery was consulted with plans to observe given bleeding was mostly into the muscle and tissue.  Now became septic on IV antibiotics due to pneumonia.  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 Off precedex. Dense hemiplegia on left.  -Continue current nutrition throughcortrak.Avoid anticoagulation or antiplatelet given high risk of bleeding -plan for SNF versus LTAC -Discussed case with SLP as well as dietitian.  MBS performed today and this will determine if patient can remain on dysphagia diet.  We will try to decrease tube feeds to nocturnal if possible and try to stimulate appetite in a.m.  Hopefully core track can be weaned in the coming few days.  Acutemetabolicencephalopathy: Looks comfortable but too weak to have any sort of conversation. Unable to assess orientation. - cotninue seroquel at 175m QHS (has helped come off precedex) - check QTc prn - clonidine taper to off per pharmacy protocol - end date 09/27/19 (could have some rebound HTN)  Acute on chronic respiratory failure with hypoxia requiring mechanical ventilation.  -Failed trial of extubation due to upper airway stridor. Cords visibly scarred on reintubation. S/P trach 7/4. -getting intermittent Lasix doses last dose 7/22 with repeat on 7/23 -Trach collar still at 10L. -CXR with improvement in aeration noted  Volume Overload with hypertension:  -She has been getting Lasix doses intermittently based on her daily assessment.  -last dose of Lasix 40 mg IV on 7/17  - Goal to keep SBP<160  Acute blood loss anemia ->Anemia of critical illness : - Jehovah's Witness, no blood products including albumin.Hemoglobin improved to 6.8 today. Continue severe anemia protocol: Iron, Folic acid, vitamin BG29 Monitor off Epogen and Vit C that was previously given Limit blood  draws to every 4-7 days as needed  Expect slow recovery  Diarrhea:  -most likely due to tube feedings.  Uncontrolled diabetes with  hyperglycemia:  -Blood sugar controlled on current regimen.  obesity Body mass index is 35.41 kg/m.  Nutrition Status: Nutrition Problem: Inadequate oral intake Etiology: inability to eat Signs/Symptoms: NPO status Interventions: Prostat, Tube feeding   DVT prophylaxis:SCDs Code Status:Full Family Communication:None at bedside; will plan to call son Disposition Plan:  Status is: Inpatient  Remains inpatient appropriate because:Unsafe d/c plan   Dispo: The patient is from:Home Anticipated d/c is FY:[facility] vs SNF Anticipated d/c date is: > 3 days Patient currently is not medically stable to d/c.  Plan to try to wean patient off Cortrak  Consultants:  IR  PCCM  Neurology  Vascular  Palliative care  hematology  Procedures:  See above  Antimicrobials:  Anti-infectives (From admission, onward)   Start     Dose/Rate Route Frequency Ordered Stop   09/16/19 2130  vancomycin (VANCOCIN) IVPB 1000 mg/200 mL premix  Status:  Discontinued        1,000 mg 200 mL/hr over 60 Minutes Intravenous Every 12 hours 09/16/19 0839 09/18/19 0756   09/16/19 1630  piperacillin-tazobactam (ZOSYN) IVPB 3.375 g        3.375 g 12.5 mL/hr over 240 Minutes Intravenous Every 8 hours 09/16/19 0835 09/23/19 1153   09/16/19 1000  ceFEPIme (MAXIPIME) 1 g in sodium chloride 0.9 % 100 mL IVPB  Status:  Discontinued        1 g 200 mL/hr over 30 Minutes Intravenous Every 12 hours 09/16/19 0756 09/16/19 0803   09/16/19 0845  piperacillin-tazobactam (ZOSYN) IVPB 3.375 g        3.375 g 100 mL/hr over 30 Minutes Intravenous  Once 09/16/19 0834 09/16/19 1007   09/16/19 0845  vancomycin (VANCOREADY) IVPB 1500 mg/300 mL        1,500 mg 150 mL/hr over 120 Minutes Intravenous  Once 09/16/19 0839 09/16/19 1248   09/16/19 0830  piperacillin-tazobactam (ZOSYN) IVPB 3.375 g  Status:  Discontinued        3.375 g 12.5 mL/hr over 240 Minutes Intravenous  Every 8 hours 09/16/19 0803 09/16/19 0834   09/16/19 0800  vancomycin (VANCOCIN) IVPB 1000 mg/200 mL premix  Status:  Discontinued        1,000 mg 200 mL/hr over 60 Minutes Intravenous Every 12 hours 09/16/19 0756 09/16/19 0839   08/31/19 1400  Ampicillin-Sulbactam (UNASYN) 3 g in sodium chloride 0.9 % 100 mL IVPB  Status:  Discontinued        3 g 200 mL/hr over 30 Minutes Intravenous Every 6 hours 08/31/19 1357 09/02/19 0819   08/25/19 0723  ceFAZolin (ANCEF) IVPB 2g/100 mL premix        2 g 200 mL/hr over 30 Minutes Intravenous 60 min pre-op 08/25/19 0723 08/25/19 0930       Subjective: Patient seen and evaluated today with no new overnight events.  Objective: Vitals:   10/03/19 0500 10/03/19 0847 10/03/19 0900 10/03/19 1118  BP: (!) 142/124 (!) 150/117  (!) 167/69  Pulse:  (!) 114 (!) 106 103  Resp: 22 22  21   Temp:   99 F (37.2 C)   TempSrc:   Axillary   SpO2: 100% 100%  100%  Weight:      Height:        Intake/Output Summary (Last 24 hours) at 10/03/2019 1224 Last data filed at 10/03/2019 0900 Gross per 24 hour  Intake  320 ml  Output 2000 ml  Net -1680 ml   Filed Weights   10/01/19 0348 10/02/19 0500 10/03/19 0259  Weight: 83 kg 84.6 kg 85.1 kg    Examination:  General exam: Appears calm and comfortable  Respiratory system: Clear to auscultation. Respiratory effort normal.  Trach collar with 10 L oxygen supplementation Cardiovascular system: S1 & S2 heard, RRR. No JVD, murmurs, rubs, gallops or clicks. No pedal edema. Gastrointestinal system: Abdomen is nondistended, soft and nontender. No organomegaly or masses felt. Normal bowel sounds heard.  NG tube feeds ongoing. Central nervous system: Alert and awake Extremities: Symmetric 5 x 5 power. Skin: No rashes, lesions or ulcers Psychiatry: Difficult to assess    Data Reviewed: I have personally reviewed following labs and imaging studies  CBC: No results for input(s): WBC, NEUTROABS, HGB, HCT, MCV, PLT in  the last 168 hours. Basic Metabolic Panel: No results for input(s): NA, K, CL, CO2, GLUCOSE, BUN, CREATININE, CALCIUM, MG, PHOS in the last 168 hours. GFR: Estimated Creatinine Clearance: 66.6 mL/min (by C-G formula based on SCr of 0.58 mg/dL). Liver Function Tests: No results for input(s): AST, ALT, ALKPHOS, BILITOT, PROT, ALBUMIN in the last 168 hours. No results for input(s): LIPASE, AMYLASE in the last 168 hours. No results for input(s): AMMONIA in the last 168 hours. Coagulation Profile: No results for input(s): INR, PROTIME in the last 168 hours. Cardiac Enzymes: No results for input(s): CKTOTAL, CKMB, CKMBINDEX, TROPONINI in the last 168 hours. BNP (last 3 results) No results for input(s): PROBNP in the last 8760 hours. HbA1C: No results for input(s): HGBA1C in the last 72 hours. CBG: Recent Labs  Lab 10/03/19 0013 10/03/19 0402 10/03/19 0432 10/03/19 0807 10/03/19 1140  GLUCAP 242* 209* 212* 131* 130*   Lipid Profile: No results for input(s): CHOL, HDL, LDLCALC, TRIG, CHOLHDL, LDLDIRECT in the last 72 hours. Thyroid Function Tests: No results for input(s): TSH, T4TOTAL, FREET4, T3FREE, THYROIDAB in the last 72 hours. Anemia Panel: No results for input(s): VITAMINB12, FOLATE, FERRITIN, TIBC, IRON, RETICCTPCT in the last 72 hours. Sepsis Labs: No results for input(s): PROCALCITON, LATICACIDVEN in the last 168 hours.  Recent Results (from the past 240 hour(s))  MRSA PCR Screening     Status: None   Collection Time: 09/25/19  5:24 AM   Specimen: Nasopharyngeal  Result Value Ref Range Status   MRSA by PCR NEGATIVE NEGATIVE Final    Comment:        The GeneXpert MRSA Assay (FDA approved for NASAL specimens only), is one component of a comprehensive MRSA colonization surveillance program. It is not intended to diagnose MRSA infection nor to guide or monitor treatment for MRSA infections. Performed at [facility], [place] [address], [place], [place] [address]           Radiology Studies: DG Chest 1 View  Result Date: 10/02/2019 CLINICAL DATA:  Hypoxemia EXAM: CHEST  1 VIEW COMPARISON:  09/22/2019 FINDINGS: Feeding catheter, tracheostomy tube and right-sided PICC line are again noted stable. Cardiac shadow is stable. Aortic calcifications are again seen. Previously seen lung opacities have improved over the interval from the prior exam. No sizable effusion is noted. IMPRESSION: Improved aeration bilaterally. Tubes and lines as described. Electronically Signed   By: Inez Catalina M.D.   On: 10/02/2019 11:37   DG Swallowing Func-Speech Pathology  Result Date: 10/02/2019 Objective Swallowing Evaluation: Type of Study: Bedside Swallow Evaluation  Patient Details Name: MOSSIE GILDER MRN: [id number] Date of Birth: 1951-02-09  Today's Date: 10/02/2019 Time: SLP Start Time (ACUTE ONLY): 1040 -SLP Stop Time (ACUTE ONLY): 1104 SLP Time Calculation (min) (ACUTE ONLY): 24 min Past Medical History: Past Medical History: Diagnosis Date . Anemia   low iron - not since having fibroids removed . Arthritis  . Hyperlipidemia  . Hypertension  . Sleep apnea   just recently diagnosed with it, has not gotten the Cpap (done 10/29/18) . Stroke [facility])   weakness on left side . Type II diabetes mellitus ([place])  Past Surgical History: Past Surgical History: Procedure Laterality Date . COLONOSCOPY   . IR ANGIO INTRA EXTRACRAN SEL COM CAROTID INNOMINATE BILAT MOD SED  10/18/2018 . IR ANGIO INTRA EXTRACRAN SEL COM CAROTID INNOMINATE BILAT MOD SED  08/18/2019 . IR ANGIO VERTEBRAL SEL SUBCLAVIAN INNOMINATE UNI R MOD SED  08/18/2019 . IR ANGIO VERTEBRAL SEL VERTEBRAL BILAT MOD SED  10/18/2018 . IR ANGIO VERTEBRAL SEL VERTEBRAL UNI L MOD SED  08/18/2019 . IR PTA INTRACRANIAL  08/25/2019 . IR US GUIDE VASC ACCESS RIGHT  10/18/2018 . IR US GUIDE VASC ACCESS RIGHT  08/18/2019 . IR US GUIDE VASC ACCESS RIGHT  08/25/2019 . MYOMECTOMY   . RADIOLOGY WITH ANESTHESIA N/A 08/18/2019  Procedure: STENTING;  Surgeon: Luanne Bras, MD;  Location: [facility];  Service: Radiology;  Laterality: N/A; . RADIOLOGY WITH ANESTHESIA N/A 08/25/2019  Procedure: STENTING;  Surgeon: Luanne Bras, MD;  Location: [facility];  Service: Radiology;  Laterality: N/A; HPI: 69 y.o. female with PMH significant for HTN, HLD, CVA 06/2018, DM, anemia, obesity, OSA, and OA. Pt underwent R ICA angioplasty on 6/14. Pt with decline in neuro status and hemoglobin on 6/15, found to have a large R groin hematoma.  Pt has had Hbg 5.0 or less and is a Jehovah's witness. MRI showed scattered small acute infarcts along the right MCA/watershed territory. Solitary small acute infarct in the left parietal cortex and right cerebellum. She was intubated on 6/19. Pt underwent tracheostomy on 7/3. PT found to be septic with RLL PNA on 7/6. As of 10/01/19, not tolerating PMSV, noted stridor and air stacking despite now being in [place] #6 uncuffed. 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  Subjective: pt alert, upright in MBS chair, agreeable to evaluation Assessment / Plan / Recommendation CHL IP CLINICAL IMPRESSIONS 10/02/2019 Clinical Impression Ms. Bonsignore demonstrates a mild pharyngeal dysphagia. She was noted to be fatigued after just having an x-ray completed and initially had high RR (32), which given time lowered to 18, more consistent with her baseline. Orally, all consistencies tested were WNL, however, pt deferred regular solid trials secondary to fatigue. Pharyngeally, pt was noted with reduced hyoid excursion, laryngeal elevation, and laryngeal vestibule closure, which resulted in trace deep penetration of thin liquids before the swallow. Oral prep phase was trialed as compensation, however, was ineffective. Pt was also unable to effectively utilize chin tuck strategy to prevent penetration. Given nectar thick liquids, she had no penetration or aspiration via cup or straw. Straw use may be preferred as pt requires full assist for feeding and straw  allows her to control bolus size. She consumed purees with good pharyngeal clearance/adequate pharyngeal constriction. Esophageal phase could not be viewed due to patient positioning and size. Recommend: nectar thick liquids via straw, purees (dysphagia 1), slow rate of feeding, meds crushed in puree, ongoing dysphagia treatment SLP Visit Diagnosis Dysphagia, unspecified (R13.10) Impact on safety and function Mild aspiration risk   CHL IP TREATMENT RECOMMENDATION 10/02/2019 Treatment Recommendations F/U FEES in ---  days (Comment)   Prognosis 10/02/2019 Prognosis for Safe Diet Advancement Good Barriers to Reach Goals Cognitive deficits Barriers/Prognosis Comment -- CHL IP DIET RECOMMENDATION 10/02/2019 SLP Diet Recommendations Nectar thick liquid;Dysphagia 1 (Puree) solids Liquid Administration via Cup;Straw Medication Administration Crushed with puree Compensations Small sips/bites Postural Changes Remain semi-upright after after feeds/meals (Comment);Seated upright at 90 degrees   CHL IP OTHER RECOMMENDATIONS 10/02/2019 Recommended Consults Consider ENT evaluation Oral Care Recommendations Oral care QID Other Recommendations Order thickener from pharmacy   CHL IP FOLLOW UP RECOMMENDATIONS 10/02/2019 Follow up Recommendations Skilled Nursing facility;LTACH;24 hour supervision/assistance   CHL IP FREQUENCY AND DURATION 10/02/2019 Speech Therapy Frequency (ACUTE ONLY) min 2x/week Treatment Duration 2 weeks      CHL IP ORAL PHASE 10/02/2019 Oral Phase WFL  CHL IP PHARYNGEAL PHASE 10/02/2019 Pharyngeal Phase Impaired Pharyngeal- Nectar Teaspoon WFL Pharyngeal -- Pharyngeal- Nectar Cup WFL Pharyngeal -- Pharyngeal- Nectar Straw WFL Pharyngeal -- Pharyngeal- Thin Teaspoon Reduced epiglottic inversion;Reduced laryngeal elevation;Reduced airway/laryngeal closure;Penetration/Aspiration before swallow Pharyngeal -- Pharyngeal- Thin Cup Reduced epiglottic inversion;Reduced laryngeal elevation;Reduced anterior laryngeal  mobility;Reduced airway/laryngeal closure;Penetration/Aspiration before swallow Pharyngeal -- Pharyngeal- Thin Straw Reduced epiglottic inversion;Reduced anterior laryngeal mobility;Reduced laryngeal elevation;Reduced airway/laryngeal closure;Penetration/Aspiration before swallow Pharyngeal -- Pharyngeal- Puree WFL  CHL IP CERVICAL ESOPHAGEAL PHASE 10/02/2019 Cervical Esophageal Phase [facility] P. Isenhour, M.S., CCC-SLP Speech-Language Pathologist Acute Rehabilitation Services Pager: [place] 10/02/2019, 11:32 AM                   Scheduled Meds: . amLODipine  10 mg Per Tube Daily  . atorvastatin  40 mg Per NG tube Daily  . chlorhexidine gluconate (MEDLINE KIT)  15 mL Mouth Rinse BID  . Chlorhexidine Gluconate Cloth  6 each Topical Daily  . famotidine  20 mg Per Tube BID  . feeding supplement (JEVITY 1.2 CAL)  900 mL Per Tube Q24H  . feeding supplement (PROSource TF)  45 mL Per Tube TID  . ferrous sulfate  220 mg Per Tube TID WC  . hydrALAZINE  100 mg Per Tube Q6H  . hydrocerin   Topical TID  . insulin aspart  0-15 Units Subcutaneous Q4H  . insulin aspart  4 Units Subcutaneous Q4H  . insulin glargine  16 Units Subcutaneous Daily  . mouth rinse  15 mL Mouth Rinse 10 times per day  . QUEtiapine  100 mg Per Tube QHS  . sodium chloride flush  10-40 mL Intracatheter Q12H   Continuous Infusions: . sodium chloride 10 mL/hr at 09/25/19 1800     LOS: 39 days    Time spent: 30 minutes    Kanden Carey Darleen Crocker, DO [facility]  If 7PM-7AM, please contact night-coverage [email or web address] 10/03/2019, 12:24 PM

## 2019-10-03 NOTE — Progress Notes (Signed)
   10/03/19 0053  Vitals  BP (!) 126/52  MAP (mmHg) 70  BP Location Right Arm  BP Method Automatic  Patient Position (if appropriate) Lying  Pulse Rate Source Monitor  ECG Heart Rate (!) 122  Resp (!) 25  Level of Consciousness  Level of Consciousness Alert  MEWS COLOR  MEWS Score Color Yellow  Oxygen Therapy  SpO2 100 %  O2 Device Tracheostomy Collar  O2 Flow Rate (L/min) 40 L/min  FiO2 (%) 100 %  Patient Activity (if Appropriate) In bed  MEWS Score  MEWS Temp 0  MEWS Systolic 0  MEWS Pulse 2  MEWS RR 1  MEWS LOC 0  MEWS Score 3  Red MEWS noted.  Focussed assessment performed, vital signs assessed.  Patient repositioned in bed.  Patient suctioned for moderate amount of tenacious, white secretions.  V/S reassessed, patient MEWS returned to yellow.

## 2019-10-04 LAB — GLUCOSE, CAPILLARY
Glucose-Capillary: 114 mg/dL — ABNORMAL HIGH (ref 70–99)
Glucose-Capillary: 155 mg/dL — ABNORMAL HIGH (ref 70–99)
Glucose-Capillary: 194 mg/dL — ABNORMAL HIGH (ref 70–99)
Glucose-Capillary: 205 mg/dL — ABNORMAL HIGH (ref 70–99)
Glucose-Capillary: 217 mg/dL — ABNORMAL HIGH (ref 70–99)
Glucose-Capillary: 273 mg/dL — ABNORMAL HIGH (ref 70–99)

## 2019-10-04 LAB — BASIC METABOLIC PANEL
Anion gap: 8 (ref 5–15)
BUN: 20 mg/dL (ref 8–23)
CO2: 32 mmol/L (ref 22–32)
Calcium: 9 mg/dL (ref 8.9–10.3)
Chloride: 100 mmol/L (ref 98–111)
Creatinine, Ser: 0.69 mg/dL (ref 0.44–1.00)
GFR calc Af Amer: >60 mL/min (ref >60)
GFR calc non Af Amer: >60 mL/min (ref >60)
Glucose, Bld: 260 mg/dL — ABNORMAL HIGH (ref 70–99)
Potassium: 4 mmol/L (ref 3.5–5.1)
Sodium: 140 mmol/L (ref 135–145)

## 2019-10-04 LAB — MAGNESIUM: Magnesium: 2 mg/dL (ref 1.7–2.4)

## 2019-10-04 MED ORDER — INSULIN ASPART 100 UNIT/ML ~~LOC~~ SOLN
4.0000 [IU] | SUBCUTANEOUS | Status: DC
  Administered 2019-10-04 – 2019-10-22 (×87): 4 [IU] via SUBCUTANEOUS

## 2019-10-04 MED ORDER — INSULIN ASPART 100 UNIT/ML ~~LOC~~ SOLN
0.0000 [IU] | SUBCUTANEOUS | Status: DC
  Administered 2019-10-04 (×3): 5 [IU] via SUBCUTANEOUS
  Administered 2019-10-05: 8 [IU] via SUBCUTANEOUS
  Administered 2019-10-05: 3 [IU] via SUBCUTANEOUS
  Administered 2019-10-05: 5 [IU] via SUBCUTANEOUS
  Administered 2019-10-05: 8 [IU] via SUBCUTANEOUS
  Administered 2019-10-05: 5 [IU] via SUBCUTANEOUS
  Administered 2019-10-05: 3 [IU] via SUBCUTANEOUS
  Administered 2019-10-06: 8 [IU] via SUBCUTANEOUS
  Administered 2019-10-06: 11 [IU] via SUBCUTANEOUS
  Administered 2019-10-06 – 2019-10-07 (×3): 3 [IU] via SUBCUTANEOUS
  Administered 2019-10-07: 8 [IU] via SUBCUTANEOUS
  Administered 2019-10-07 – 2019-10-08 (×4): 3 [IU] via SUBCUTANEOUS
  Administered 2019-10-08: 2 [IU] via SUBCUTANEOUS
  Administered 2019-10-08: 3 [IU] via SUBCUTANEOUS
  Administered 2019-10-08: 2 [IU] via SUBCUTANEOUS
  Administered 2019-10-08: 3 [IU] via SUBCUTANEOUS
  Administered 2019-10-09 – 2019-10-10 (×6): 2 [IU] via SUBCUTANEOUS
  Administered 2019-10-10: 5 [IU] via SUBCUTANEOUS
  Administered 2019-10-11 (×2): 3 [IU] via SUBCUTANEOUS
  Administered 2019-10-11 (×2): 2 [IU] via SUBCUTANEOUS
  Administered 2019-10-11 (×2): 3 [IU] via SUBCUTANEOUS
  Administered 2019-10-12: 2 [IU] via SUBCUTANEOUS
  Administered 2019-10-12: 3 [IU] via SUBCUTANEOUS
  Administered 2019-10-12 (×2): 2 [IU] via SUBCUTANEOUS
  Administered 2019-10-12: 3 [IU] via SUBCUTANEOUS
  Administered 2019-10-12: 2 [IU] via SUBCUTANEOUS
  Administered 2019-10-13 (×3): 3 [IU] via SUBCUTANEOUS
  Administered 2019-10-13: 2 [IU] via SUBCUTANEOUS
  Administered 2019-10-13 – 2019-10-14 (×2): 3 [IU] via SUBCUTANEOUS
  Administered 2019-10-14: 2 [IU] via SUBCUTANEOUS
  Administered 2019-10-14: 5 [IU] via SUBCUTANEOUS
  Administered 2019-10-14: 3 [IU] via SUBCUTANEOUS
  Administered 2019-10-14: 2 [IU] via SUBCUTANEOUS
  Administered 2019-10-14 – 2019-10-15 (×6): 3 [IU] via SUBCUTANEOUS
  Administered 2019-10-15: 8 [IU] via SUBCUTANEOUS
  Administered 2019-10-16: 5 [IU] via SUBCUTANEOUS
  Administered 2019-10-16 (×2): 2 [IU] via SUBCUTANEOUS
  Administered 2019-10-16: 5 [IU] via SUBCUTANEOUS
  Administered 2019-10-16 – 2019-10-17 (×2): 2 [IU] via SUBCUTANEOUS
  Administered 2019-10-17 (×2): 3 [IU] via SUBCUTANEOUS
  Administered 2019-10-17: 2 [IU] via SUBCUTANEOUS
  Administered 2019-10-17 (×2): 3 [IU] via SUBCUTANEOUS
  Administered 2019-10-18: 2 [IU] via SUBCUTANEOUS
  Administered 2019-10-18: 3 [IU] via SUBCUTANEOUS
  Administered 2019-10-18: 2 [IU] via SUBCUTANEOUS
  Administered 2019-10-18: 3 [IU] via SUBCUTANEOUS
  Administered 2019-10-18: 2 [IU] via SUBCUTANEOUS
  Administered 2019-10-18 – 2019-10-19 (×5): 3 [IU] via SUBCUTANEOUS
  Administered 2019-10-19: 5 [IU] via SUBCUTANEOUS
  Administered 2019-10-19: 3 [IU] via SUBCUTANEOUS
  Administered 2019-10-20: 2 [IU] via SUBCUTANEOUS
  Administered 2019-10-20: 3 [IU] via SUBCUTANEOUS
  Administered 2019-10-20 (×2): 5 [IU] via SUBCUTANEOUS
  Administered 2019-10-21: 3 [IU] via SUBCUTANEOUS
  Administered 2019-10-21: 2 [IU] via SUBCUTANEOUS
  Administered 2019-10-21: 3 [IU] via SUBCUTANEOUS
  Administered 2019-10-21 (×2): 2 [IU] via SUBCUTANEOUS
  Administered 2019-10-22 (×2): 3 [IU] via SUBCUTANEOUS
  Administered 2019-10-22: 2 [IU] via SUBCUTANEOUS
  Administered 2019-10-22 (×2): 3 [IU] via SUBCUTANEOUS
  Administered 2019-10-22: 5 [IU] via SUBCUTANEOUS
  Administered 2019-10-23: 3 [IU] via SUBCUTANEOUS
  Administered 2019-10-23 (×2): 5 [IU] via SUBCUTANEOUS
  Administered 2019-10-23: 8 [IU] via SUBCUTANEOUS
  Administered 2019-10-24 (×4): 5 [IU] via SUBCUTANEOUS
  Administered 2019-10-24: 8 [IU] via SUBCUTANEOUS
  Administered 2019-10-25: 3 [IU] via SUBCUTANEOUS
  Administered 2019-10-25 (×2): 2 [IU] via SUBCUTANEOUS
  Administered 2019-10-25: 5 [IU] via SUBCUTANEOUS
  Administered 2019-10-25 (×2): 3 [IU] via SUBCUTANEOUS
  Administered 2019-10-26: 2 [IU] via SUBCUTANEOUS
  Administered 2019-10-26 (×2): 3 [IU] via SUBCUTANEOUS
  Administered 2019-10-26: 2 [IU] via SUBCUTANEOUS
  Administered 2019-10-26: 3 [IU] via SUBCUTANEOUS
  Administered 2019-10-26 – 2019-10-27 (×2): 2 [IU] via SUBCUTANEOUS
  Administered 2019-10-27: 3 [IU] via SUBCUTANEOUS
  Administered 2019-10-27: 2 [IU] via SUBCUTANEOUS
  Administered 2019-10-28 (×3): 3 [IU] via SUBCUTANEOUS
  Administered 2019-10-28: 8 [IU] via SUBCUTANEOUS
  Administered 2019-10-28 – 2019-10-29 (×3): 3 [IU] via SUBCUTANEOUS
  Administered 2019-10-29: 2 [IU] via SUBCUTANEOUS
  Administered 2019-10-29: 3 [IU] via SUBCUTANEOUS
  Administered 2019-10-29: 2 [IU] via SUBCUTANEOUS
  Administered 2019-10-30 (×2): 3 [IU] via SUBCUTANEOUS
  Administered 2019-10-30: 2 [IU] via SUBCUTANEOUS
  Administered 2019-10-30: 8 [IU] via SUBCUTANEOUS

## 2019-10-04 NOTE — Progress Notes (Signed)
PROGRESS NOTE    Donna [name]  VHQ:[id number] DOB: [birth year] DOA: 08/25/2019 PCP: Minette Brine, FNP   Brief Narrative:   [name] an 69 y.o.femaleJehovah witness, with prior history of HTN, HLD, CVA (06/2018- right MCA territory stroke with residual mild left hemiparesis), multiple intracranial stenosis including anterior/ posterior circulation, DM, anemia, obesity, OSA, and arthritis.  Admitted by Neuro IR on 6/14 for cerebral angiogram s/p RT ICA cervical/ petrous junction balloon angioplasty for severe stenosis via right femoral approach. Was placed on ASA/ brillinta. Monitored in Neuro ICU. Noted to have developed hypotension, increased left sided weakness and lethargy on 6/15 am with Hgb drop from 13.3 to 6.9 with new AKI. Post MRI/ MRA showed scattered small acute infarcts along the right MCA/ watershed territory suspected to be resultant either post procedure vs hypoperfusion.   A CT abd/ pelvis was obtained given concern for retroperitoneal bleed which showed a mild to moderate amount of retroperitoneal hematoma in the right iliac and inguinal regiones with moderate size hematoma noted the the soft tissues anterior to the musculature of the right hip; no definite intrapertioneal hemorrhage. INR, PT, and platelets rechecked and were wnl. Vascular surgery was consulted with plans to observe given bleeding was mostly into the muscle and tissue.  Now became septic on IV antibiotics due to pneumonia.  Assessment & Plan:   Principal Problem:   Internal carotid artery stenosis, right Active Problems:   Essential hypertension   Hyperlipidemia   Cerebral infarction (HCC)   Type 2 diabetes mellitus with vascular disease ([place])   AKI (acute kidney injury) ([place])   Anemia   Acute respiratory failure (HCC)   Acute blood loss anemia   Palliative care by specialist   Goals of care, counseling/discussion   Hypoxia   Sepsis with acute hypoxic respiratory failure without  septic shock (HCC)   Hypokalemia   Right MCA CVA due to bilateral intracranial stenoses: -s/p RT ICA junction balloon angioplasty for severe stenosis c/b right thigh and retroperitoneal hematoma. Off precedex. Dense hemiplegia on left.  -Continue current nutrition throughcortrak.Avoid anticoagulation or antiplatelet given high risk of bleeding -plan forSNF versus LTAC -Discussed case with SLP as well as dietitian. MBS performed today and this will determine if patient can remain on dysphagia diet. We will try to decrease tube feeds to nocturnal if possible and try to stimulate appetite in a.m. Hopefully core track can be weaned in the coming few days.  Acutemetabolicencephalopathy: Looks comfortable but too weak to have any sort of conversation. Unable to assess orientation. - cotninue seroquel at 134m QHS (has helped come off precedex) - check QTc prn - clonidine taper to off per pharmacy protocol - end date 09/27/19 (could have some rebound HTN)  Acute on chronic respiratory failure with hypoxia requiring mechanical ventilation.  -Failed trial of extubation due to upper airway stridor. Cords visibly scarred on reintubation. S/P trach 7/4. -getting intermittent Lasix doseslast dose 7/22 with repeat on 7/23 -Trach collar  now at 5 L. -CXR with improvement in aeration noted  Volume Overload with hypertension:  -She has been getting Lasix doses intermittently based on her daily assessment.  -last dose of Lasix 40 mg IV on 7/17  - Goal to keep SBP<160  Acute blood loss anemia ->Anemia of critical illness : - Jehovah's Witness, no blood products including albumin.Hemoglobin improved to 6.8 today. Continue severe anemia protocol: Iron, Folic acid, vitamin BU13 Monitor off Epogen and Vit C that was previously given Limit blood draws to every  4-7 days as needed  Expect slow recovery  Diarrhea:  -most likely due to tube feedings.  Uncontrolled diabetes with  hyperglycemia:  -Blood sugar controlled on current regimen.  obesity Body mass index is 35.41 kg/m.  Nutrition Status: Nutrition Problem: Inadequate oral intake Etiology: inability to eat Signs/Symptoms: NPO status Interventions: Prostat, Tube feeding   DVT prophylaxis:SCDs Code Status:Full Family Communication:None at bedside; will plan to call son Disposition Plan:  Status is: Inpatient  Remains inpatient appropriate because:Unsafe d/c plan   Dispo: The patient is from:Home Anticipated d/c is PR:XYVOPF SNF Anticipated d/c date is: > 3 days Patient currently is not medically stable to d/c.Plan to try to wean patient off Cortrak  Consultants:  IR  PCCM  Neurology  Vascular  Palliative care  hematology  Procedures:  See above  Antimicrobials:  Anti-infectives (From admission, onward)   Start     Dose/Rate Route Frequency Ordered Stop   09/16/19 2130  vancomycin (VANCOCIN) IVPB 1000 mg/200 mL premix  Status:  Discontinued        1,000 mg 200 mL/hr over 60 Minutes Intravenous Every 12 hours 09/16/19 0839 09/18/19 0756   09/16/19 1630  piperacillin-tazobactam (ZOSYN) IVPB 3.375 g        3.375 g 12.5 mL/hr over 240 Minutes Intravenous Every 8 hours 09/16/19 0835 09/23/19 1153   09/16/19 1000  ceFEPIme (MAXIPIME) 1 g in sodium chloride 0.9 % 100 mL IVPB  Status:  Discontinued        1 g 200 mL/hr over 30 Minutes Intravenous Every 12 hours 09/16/19 0756 09/16/19 0803   09/16/19 0845  piperacillin-tazobactam (ZOSYN) IVPB 3.375 g        3.375 g 100 mL/hr over 30 Minutes Intravenous  Once 09/16/19 0834 09/16/19 1007   09/16/19 0845  vancomycin (VANCOREADY) IVPB 1500 mg/300 mL        1,500 mg 150 mL/hr over 120 Minutes Intravenous  Once 09/16/19 0839 09/16/19 1248   09/16/19 0830  piperacillin-tazobactam (ZOSYN) IVPB 3.375 g  Status:  Discontinued        3.375 g 12.5 mL/hr over 240 Minutes Intravenous  Every 8 hours 09/16/19 0803 09/16/19 0834   09/16/19 0800  vancomycin (VANCOCIN) IVPB 1000 mg/200 mL premix  Status:  Discontinued        1,000 mg 200 mL/hr over 60 Minutes Intravenous Every 12 hours 09/16/19 0756 09/16/19 0839   08/31/19 1400  Ampicillin-Sulbactam (UNASYN) 3 g in sodium chloride 0.9 % 100 mL IVPB  Status:  Discontinued        3 g 200 mL/hr over 30 Minutes Intravenous Every 6 hours 08/31/19 1357 09/02/19 0819   08/25/19 0723  ceFAZolin (ANCEF) IVPB 2g/100 mL premix        2 g 200 mL/hr over 30 Minutes Intravenous 60 min pre-op 08/25/19 0723 08/25/19 0930       Subjective: Patient seen and evaluated today with no concerns or events noted overnight.  Objective: Vitals:   10/04/19 0316 10/04/19 0405 10/04/19 0700 10/04/19 0752  BP:  (!) 141/62    Pulse: 105 100 (!) 110   Resp: _0 Temp:  99.4 F (37.4 C)  99.3 F (37.4 C)  TempSrc:  Oral  Oral  SpO2: 100%     Weight:      Height:        Intake/Output Summary (Last 24 hours) at 10/04/2019 1103 Last data filed at 10/03/2019 2231 Gross per 24 hour  Intake 50 ml  Output  501 ml  Net -451 ml   Filed Weights   10/01/19 0348 10/02/19 0500 10/03/19 0259  Weight: 83 kg 84.6 kg 85.1 kg    Examination:  General exam: Appears calm and comfortable  Respiratory system: Clear to auscultation. Respiratory effort normal.  Trach collar at 5 L supplementation. Cardiovascular system: S1 & S2 heard, RRR. No JVD, murmurs, rubs, gallops or clicks. No pedal edema. Gastrointestinal system: Abdomen is nondistended, soft and nontender. No organomegaly or masses felt. Normal bowel sounds heard.  Ongoing core track feeds Central nervous system: Somnolent Extremities: No edema Skin: No rashes, lesions or ulcers Psychiatry: Cannot be assessed    Data Reviewed: I have personally reviewed following labs and imaging studies  CBC: No results for input(s): WBC, NEUTROABS, HGB, HCT, MCV, PLT in the last 168 hours. Basic  Metabolic Panel: Recent Labs  Lab 10/04/19 0500  NA 140  K 4.0  CL 100  CO2 32  GLUCOSE 260*  BUN 20  CREATININE 0.69  CALCIUM 9.0  MG 2.0   GFR: Estimated Creatinine Clearance: 66.6 mL/min (by C-G formula based on SCr of 0.69 mg/dL). Liver Function Tests: No results for input(s): AST, ALT, ALKPHOS, BILITOT, PROT, ALBUMIN in the last 168 hours. No results for input(s): LIPASE, AMYLASE in the last 168 hours. No results for input(s): AMMONIA in the last 168 hours. Coagulation Profile: No results for input(s): INR, PROTIME in the last 168 hours. Cardiac Enzymes: No results for input(s): CKTOTAL, CKMB, CKMBINDEX, TROPONINI in the last 168 hours. BNP (last 3 results) No results for input(s): PROBNP in the last 8760 hours. HbA1C: Recent Labs    10/03/19 1630  HGBA1C 5.2   CBG: Recent Labs  Lab 10/03/19 1615 10/03/19 1958 10/03/19 2334 10/04/19 0407 10/04/19 0749  GLUCAP 123* 153* 188* 194* 205*   Lipid Profile: No results for input(s): CHOL, HDL, LDLCALC, TRIG, CHOLHDL, LDLDIRECT in the last 72 hours. Thyroid Function Tests: No results for input(s): TSH, T4TOTAL, FREET4, T3FREE, THYROIDAB in the last 72 hours. Anemia Panel: No results for input(s): VITAMINB12, FOLATE, FERRITIN, TIBC, IRON, RETICCTPCT in the last 72 hours. Sepsis Labs: No results for input(s): PROCALCITON, LATICACIDVEN in the last 168 hours.  Recent Results (from the past 240 hour(s))  MRSA PCR Screening     Status: None   Collection Time: 09/25/19  5:24 AM   Specimen: Nasopharyngeal  Result Value Ref Range Status   MRSA by PCR NEGATIVE NEGATIVE Final    Comment:        The GeneXpert MRSA Assay (FDA approved for NASAL specimens only), is one component of a comprehensive MRSA colonization surveillance program. It is not intended to diagnose MRSA infection nor to guide or monitor treatment for MRSA infections. Performed at [facility], [place] [address], [place], [place] [address]           Radiology Studies: DG Swallowing Func-Speech Pathology  Result Date: 10/02/2019 Objective Swallowing Evaluation: Type of Study: Bedside Swallow Evaluation  Patient Details Name: LYLY CANIZALES MRN: [id number] Date of Birth: 1950/12/20 Today's Date: 10/02/2019 Time: SLP Start Time (ACUTE ONLY): 4696 -SLP Stop Time (ACUTE ONLY): 1104 SLP Time Calculation (min) (ACUTE ONLY): 24 min Past Medical History: Past Medical History: Diagnosis Date . Anemia   low iron - not since having fibroids removed . Arthritis  . Hyperlipidemia  . Hypertension  . Sleep apnea   just recently diagnosed with it, has not gotten the Cpap (done 10/29/18) . Stroke [facility])   weakness on  left side . Type II diabetes mellitus ([place])  Past Surgical History: Past Surgical History: Procedure Laterality Date . COLONOSCOPY   . IR ANGIO INTRA EXTRACRAN SEL COM CAROTID INNOMINATE BILAT MOD SED  10/18/2018 . IR ANGIO INTRA EXTRACRAN SEL COM CAROTID INNOMINATE BILAT MOD SED  08/18/2019 . IR ANGIO VERTEBRAL SEL SUBCLAVIAN INNOMINATE UNI R MOD SED  08/18/2019 . IR ANGIO VERTEBRAL SEL VERTEBRAL BILAT MOD SED  10/18/2018 . IR ANGIO VERTEBRAL SEL VERTEBRAL UNI L MOD SED  08/18/2019 . IR PTA INTRACRANIAL  08/25/2019 . IR US GUIDE VASC ACCESS RIGHT  10/18/2018 . IR US GUIDE VASC ACCESS RIGHT  08/18/2019 . IR US GUIDE VASC ACCESS RIGHT  08/25/2019 . MYOMECTOMY   . RADIOLOGY WITH ANESTHESIA N/A 08/18/2019  Procedure: STENTING;  Surgeon: Luanne Bras, MD;  Location: [facility];  Service: Radiology;  Laterality: N/A; . RADIOLOGY WITH ANESTHESIA N/A 08/25/2019  Procedure: STENTING;  Surgeon: Luanne Bras, MD;  Location: [facility];  Service: Radiology;  Laterality: N/A; HPI: 69 y.o. female with PMH significant for HTN, HLD, CVA 06/2018, DM, anemia, obesity, OSA, and OA. Pt underwent R ICA angioplasty on 6/14. Pt with decline in neuro status and hemoglobin on 6/15, found to have a large R groin hematoma.  Pt has had Hbg 5.0 or less and is a Jehovah's witness. MRI showed scattered  small acute infarcts along the right MCA/watershed territory. Solitary small acute infarct in the left parietal cortex and right cerebellum. She was intubated on 6/19. Pt underwent tracheostomy on 7/3. PT found to be septic with RLL PNA on 7/6. As of 10/01/19, not tolerating PMSV, noted stridor and air stacking despite now being in [place] #6 uncuffed. 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  Subjective: pt alert, upright in MBS chair, agreeable to evaluation Assessment / Plan / Recommendation CHL IP CLINICAL IMPRESSIONS 10/02/2019 Clinical Impression Ms. Kimes demonstrates a mild pharyngeal dysphagia. She was noted to be fatigued after just having an x-ray completed and initially had high RR (32), which given time lowered to 18, more consistent with her baseline. Orally, all consistencies tested were WNL, however, pt deferred regular solid trials secondary to fatigue. Pharyngeally, pt was noted with reduced hyoid excursion, laryngeal elevation, and laryngeal vestibule closure, which resulted in trace deep penetration of thin liquids before the swallow. Oral prep phase was trialed as compensation, however, was ineffective. Pt was also unable to effectively utilize chin tuck strategy to prevent penetration. Given nectar thick liquids, she had no penetration or aspiration via cup or straw. Straw use may be preferred as pt requires full assist for feeding and straw allows her to control bolus size. She consumed purees with good pharyngeal clearance/adequate pharyngeal constriction. Esophageal phase could not be viewed due to patient positioning and size. Recommend: nectar thick liquids via straw, purees (dysphagia 1), slow rate of feeding, meds crushed in puree, ongoing dysphagia treatment SLP Visit Diagnosis Dysphagia, unspecified (R13.10) Impact on safety and function Mild aspiration risk   CHL IP TREATMENT RECOMMENDATION 10/02/2019 Treatment Recommendations F/U FEES in --- days (Comment)    Prognosis 10/02/2019 Prognosis for Safe Diet Advancement Good Barriers to Reach Goals Cognitive deficits Barriers/Prognosis Comment -- CHL IP DIET RECOMMENDATION 10/02/2019 SLP Diet Recommendations Nectar thick liquid;Dysphagia 1 (Puree) solids Liquid Administration via Cup;Straw Medication Administration Crushed with puree Compensations Small sips/bites Postural Changes Remain semi-upright after after feeds/meals (Comment);Seated upright at 90 degrees   CHL IP OTHER RECOMMENDATIONS 10/02/2019 Recommended Consults Consider ENT evaluation Oral Care Recommendations Oral care QID  Other Recommendations Order thickener from pharmacy   CHL IP FOLLOW UP RECOMMENDATIONS 10/02/2019 Follow up Recommendations Skilled Nursing facility;LTACH;24 hour supervision/assistance   CHL IP FREQUENCY AND DURATION 10/02/2019 Speech Therapy Frequency (ACUTE ONLY) min 2x/week Treatment Duration 2 weeks      CHL IP ORAL PHASE 10/02/2019 Oral Phase WFL  CHL IP PHARYNGEAL PHASE 10/02/2019 Pharyngeal Phase Impaired Pharyngeal- Nectar Teaspoon WFL Pharyngeal -- Pharyngeal- Nectar Cup WFL Pharyngeal -- Pharyngeal- Nectar Straw WFL Pharyngeal -- Pharyngeal- Thin Teaspoon Reduced epiglottic inversion;Reduced laryngeal elevation;Reduced airway/laryngeal closure;Penetration/Aspiration before swallow Pharyngeal -- Pharyngeal- Thin Cup Reduced epiglottic inversion;Reduced laryngeal elevation;Reduced anterior laryngeal mobility;Reduced airway/laryngeal closure;Penetration/Aspiration before swallow Pharyngeal -- Pharyngeal- Thin Straw Reduced epiglottic inversion;Reduced anterior laryngeal mobility;Reduced laryngeal elevation;Reduced airway/laryngeal closure;Penetration/Aspiration before swallow Pharyngeal -- Pharyngeal- Puree WFL  CHL IP CERVICAL ESOPHAGEAL PHASE 10/02/2019 Cervical Esophageal Phase [facility] [place] P. Isenhour, M.S., CCC-SLP Speech-Language Pathologist Acute Rehabilitation Services Pager: [place] 10/02/2019, 11:32 AM                    Scheduled Meds: . amLODipine  10 mg Per Tube Daily  . atorvastatin  40 mg Per NG tube Daily  . chlorhexidine gluconate (MEDLINE KIT)  15 mL Mouth Rinse BID  . Chlorhexidine Gluconate Cloth  6 each Topical Daily  . famotidine  20 mg Per Tube BID  . feeding supplement (JEVITY 1.2 CAL)  900 mL Per Tube Q24H  . feeding supplement (PROSource TF)  45 mL Per Tube TID  . ferrous sulfate  220 mg Per Tube TID WC  . hydrALAZINE  100 mg Per Tube Q6H  . hydrocerin   Topical TID  . insulin aspart  0-15 Units Subcutaneous Q4H  . insulin aspart  4 Units Subcutaneous Q4H  . insulin glargine  8 Units Subcutaneous Daily  . mouth rinse  15 mL Mouth Rinse 10 times per day  . QUEtiapine  100 mg Per Tube QHS  . sodium chloride flush  10-40 mL Intracatheter Q12H   Continuous Infusions: . sodium chloride 10 mL/hr at 09/25/19 1800     LOS: 40 days    Time spent: 30 minutes    Renaldo Gornick Darleen Crocker, DO [facility]  If 7PM-7AM, please contact night-coverage [email or web address] 10/04/2019, 11:03 AM

## 2019-10-05 ENCOUNTER — Inpatient Hospital Stay (HOSPITAL_COMMUNITY): Payer: Medicare HMO

## 2019-10-05 LAB — GLUCOSE, CAPILLARY
Glucose-Capillary: 248 mg/dL — ABNORMAL HIGH (ref 70–99)
Glucose-Capillary: 233 mg/dL — ABNORMAL HIGH (ref 70–99)
Glucose-Capillary: 165 mg/dL — ABNORMAL HIGH (ref 70–99)
Glucose-Capillary: 186 mg/dL — ABNORMAL HIGH (ref 70–99)
Glucose-Capillary: 283 mg/dL — ABNORMAL HIGH (ref 70–99)
Glucose-Capillary: 262 mg/dL — ABNORMAL HIGH (ref 70–99)

## 2019-10-05 MED ORDER — INSULIN GLARGINE 100 UNIT/ML ~~LOC~~ SOLN
12.0000 [IU] | Freq: Every day | SUBCUTANEOUS | Status: DC
  Administered 2019-10-06 – 2019-10-15 (×9): 12 [IU] via SUBCUTANEOUS
  Filled 2019-10-05 (×11): qty 0.12

## 2019-10-05 NOTE — Progress Notes (Signed)
PROGRESS NOTE    Donna [name]  XFG:[id number] DOB: [birth year] DOA: 08/25/2019 PCP: Minette Brine, FNP   Brief Narrative:   Donna [name] an 69 y.o.femaleJehovah witness, with prior history of HTN, HLD, CVA (06/2018- right MCA territory stroke with residual mild left hemiparesis), multiple intracranial stenosis including anterior/ posterior circulation, DM, anemia, obesity, OSA, and arthritis.  Admitted by Neuro IR on 6/14 for cerebral angiogram s/p RT ICA cervical/ petrous junction balloon angioplasty for severe stenosis via right femoral approach. Was placed on ASA/ brillinta. Monitored in Neuro ICU. Noted to have developed hypotension, increased left sided weakness and lethargy on 6/15 am with Hgb drop from 13.3 to 6.9 with new AKI. Post MRI/ MRA showed scattered small acute infarcts along the right MCA/ watershed territory suspected to be resultant either post procedure vs hypoperfusion.   A CT abd/ pelvis was obtained given concern for retroperitoneal bleed which showed a mild to moderate amount of retroperitoneal hematoma in the right iliac and inguinal regiones with moderate size hematoma noted the the soft tissues anterior to the musculature of the right hip; no definite intrapertioneal hemorrhage. INR, PT, and platelets rechecked and were wnl. Vascular surgery was consulted with plans to observe given bleeding was mostly into the muscle and tissue.  Now became septic on IV antibiotics due to pneumonia.  -Patient continues to remain on NG tube feeds and he is only tolerating very small amounts of dysphagia diet.  We will try to see if her oral intake will increase, otherwise will need to consider PEG tube placement prior to discharge to SNF.  Continues to have left-sided neglect.  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 Off precedex. Dense hemiplegia on left.  -Continue current nutrition throughcortrak.Avoid anticoagulation or antiplatelet given high risk of bleeding -plan forSNF once cortrack can be removed.  Otherwise may need to consider PEG tube. -Discussed case with SLP as well as dietitian. MBS performed today and this will determine if patient can remain on dysphagia diet. We will try to decrease tube feeds to nocturnal if possible and try to stimulate appetite in a.m. Hopefully cortrack can be weaned in the coming few days once oral dietary intake improves.  Hopefully this is the case, otherwise will need to consider PEG tube placement.  Acutemetabolicencephalopathy: Looks comfortable but too weak to have any sort of conversation. Unable to assess orientation. - cotninue seroquel at '100mg'$  QHS (has helped come off precedex) - check QTc prn - clonidine taper to off per pharmacy protocol - end date 09/27/19 (could have some rebound HTN)  Acute on chronic respiratory failure with hypoxia requiring mechanical ventilation.  -Failed trial of extubation due to upper airway stridor. Cords visibly scarred on reintubation. S/P trach 7/4. -getting intermittent Lasix doseslast dose 7/22 with repeat on 7/23, hold further Lasix for now -Trach collar now at 5 L. -CXR with  improvement in aeration noted  Volume Overload with hypertension:  -She has been getting Lasix doses intermittently based on her daily assessment.  -last dose  of Lasix 40 mg IV on 7/ 23 - Goal to keep SBP<160  Acute blood loss anemia ->Anemia of critical illness : - Jehovah's Witness, no blood products including albumin.Hemoglobin improved to 6.8 today. Continue severe anemia protocol: Iron, Folic acid, vitamin N82, Monitor off Epogen and Vit C that was previously given Limit blood draws to every 4-7 days as needed  Expect slow recovery  Diarrhea-resolved -most likely due to tube feedings -Continue to monitor  Uncontrolled diabetes with hyperglycemia:  -Blood sugar still labile -Increase Levemir to 12 units from 8 units on 7/25 -Hemoglobin A1c 5.2%  obesity Body mass index is 35.41 kg/m.  Nutrition Status: Nutrition Problem: Inadequate oral intake Etiology: inability to eat Signs/Symptoms: NPO status Interventions: Prostat, Tube feeding   DVT prophylaxis:SCDs Code Status:Full Family Communication:None at bedside; discussed with son on phone 7/25 about the potential need for PEG tube placement in the next several days if oral intake does not improve. Disposition Plan:  Status is: Inpatient  Remains inpatient appropriate because:Unsafe d/c plan   Dispo: The patient is from:Home Anticipated d/c is to:SNF, insurance has not approved LTAC Anticipated d/c date is: > 3 days Patient currently is not medically stable to d/c.Plan to try to wean patient off Cortrak, otherwise may need PEG tube placement prior to discharge.  Consultants:  IR  PCCM  Neurology  Vascular  Palliative care  hematology  Procedures:  See above  Antimicrobials:  Anti-infectives (From admission, onward)   Start     Dose/Rate Route Frequency Ordered Stop   09/16/19 2130  vancomycin (VANCOCIN) IVPB 1000 mg/200 mL premix  Status:  Discontinued        1,000 mg 200 mL/hr over 60 Minutes Intravenous Every 12 hours 09/16/19 0839 09/18/19 0756   09/16/19 1630  piperacillin-tazobactam  (ZOSYN) IVPB 3.375 g        3.375 g 12.5 mL/hr over 240 Minutes Intravenous Every 8 hours 09/16/19 0835 09/23/19 1153   09/16/19 1000  ceFEPIme (MAXIPIME) 1 g in sodium chloride 0.9 % 100 mL IVPB  Status:  Discontinued        1 g 200 mL/hr over 30 Minutes Intravenous Every 12 hours 09/16/19 0756 09/16/19 0803   09/16/19 0845  piperacillin-tazobactam (ZOSYN) IVPB 3.375 g        3.375 g 100 mL/hr over 30 Minutes Intravenous  Once 09/16/19 0834 09/16/19 1007   09/16/19 0845  vancomycin (VANCOREADY) IVPB 1500 mg/300 mL        1,500 mg 150 mL/hr over 120 Minutes Intravenous  Once 09/16/19 0839 09/16/19 1248   09/16/19 0830  piperacillin-tazobactam (ZOSYN) IVPB 3.375 g  Status:  Discontinued        3.375 g 12.5 mL/hr over 240 Minutes Intravenous Every 8 hours 09/16/19 0803 09/16/19 0834   09/16/19 0800  vancomycin (VANCOCIN) IVPB 1000 mg/200 mL premix  Status:  Discontinued        1,000 mg 200 mL/hr over 60 Minutes Intravenous Every 12 hours 09/16/19 0756 09/16/19 0839   08/31/19 1400  Ampicillin-Sulbactam (UNASYN) 3 g in sodium chloride 0.9 % 100 mL IVPB  Status:  Discontinued        3 g 200 mL/hr over 30 Minutes Intravenous Every 6 hours 08/31/19 1357 09/02/19 0819   08/25/19 0723  ceFAZolin (ANCEF) IVPB 2g/100 mL premix  2 g 200 mL/hr over 30 Minutes Intravenous 60 min pre-op 08/25/19 0723 08/25/19 0930       Subjective: Patient seen and evaluated today with no issues noted overnight.  Objective: Vitals:   10/05/19 0349 10/05/19 0403 10/05/19 0410 10/05/19 0716  BP: (!) 160/62 (!) 171/72  (!) 146/61  Pulse: 99  (!) 110   Resp:   23 22  Temp: 99.5 F (37.5 C)   98.7 F (37.1 C)  TempSrc: Axillary   Axillary  SpO2:   100% 98%  Weight:      Height:        Intake/Output Summary (Last 24 hours) at 10/05/2019 1000 Last data filed at 10/04/2019 2254 Gross per 24 hour  Intake 75 ml  Output 600 ml  Net -525 ml   Filed Weights   10/01/19 0348 10/02/19 0500 10/03/19 0259   Weight: 83 kg 84.6 kg 85.1 kg    Examination:  General exam: Appears calm and comfortable, left-sided neglect Respiratory system: Clear to auscultation. Respiratory effort normal.  Tracheostomy with trach collar at 5 L. Cardiovascular system: S1 & S2 heard, RRR. No JVD, murmurs, rubs, gallops or clicks. No pedal edema. Gastrointestinal system: Abdomen is soft, continues with NG tube feedings. Central nervous system: Somnolent Extremities: No edema Skin: No rashes, lesions or ulcers Psychiatry: Cannot be assessed    Data Reviewed: I have personally reviewed following labs and imaging studies  CBC: No results for input(s): WBC, NEUTROABS, HGB, HCT, MCV, PLT in the last 168 hours. Basic Metabolic Panel: Recent Labs  Lab 10/04/19 0500  NA 140  K 4.0  CL 100  CO2 32  GLUCOSE 260*  BUN 20  CREATININE 0.69  CALCIUM 9.0  MG 2.0   GFR: Estimated Creatinine Clearance: 66.6 mL/min (by C-G formula based on SCr of 0.69 mg/dL). Liver Function Tests: No results for input(s): AST, ALT, ALKPHOS, BILITOT, PROT, ALBUMIN in the last 168 hours. No results for input(s): LIPASE, AMYLASE in the last 168 hours. No results for input(s): AMMONIA in the last 168 hours. Coagulation Profile: No results for input(s): INR, PROTIME in the last 168 hours. Cardiac Enzymes: No results for input(s): CKTOTAL, CKMB, CKMBINDEX, TROPONINI in the last 168 hours. BNP (last 3 results) No results for input(s): PROBNP in the last 8760 hours. HbA1C: Recent Labs    10/03/19 1630  HGBA1C 5.2   CBG: Recent Labs  Lab 10/04/19 1557 10/04/19 1951 10/04/19 2335 10/05/19 0348 10/05/19 0714  GLUCAP 114* 217* 273* 248* 233*   Lipid Profile: No results for input(s): CHOL, HDL, LDLCALC, TRIG, CHOLHDL, LDLDIRECT in the last 72 hours. Thyroid Function Tests: No results for input(s): TSH, T4TOTAL, FREET4, T3FREE, THYROIDAB in the last 72 hours. Anemia Panel: No results for input(s): VITAMINB12, FOLATE,  FERRITIN, TIBC, IRON, RETICCTPCT in the last 72 hours. Sepsis Labs: No results for input(s): PROCALCITON, LATICACIDVEN in the last 168 hours.  No results found for this or any previous visit (from the past 240 hour(s)).       Radiology Studies: No results found.      Scheduled Meds: . amLODipine  10 mg Per Tube Daily  . atorvastatin  40 mg Per NG tube Daily  . chlorhexidine gluconate (MEDLINE KIT)  15 mL Mouth Rinse BID  . Chlorhexidine Gluconate Cloth  6 each Topical Daily  . famotidine  20 mg Per Tube BID  . feeding supplement (JEVITY 1.2 CAL)  900 mL Per Tube Q24H  . feeding supplement (PROSource TF)  45  mL Per Tube TID  . ferrous sulfate  220 mg Per Tube TID WC  . hydrALAZINE  100 mg Per Tube Q6H  . hydrocerin   Topical TID  . insulin aspart  0-15 Units Subcutaneous Q4H  . insulin aspart  4 Units Subcutaneous Q4H  . [START ON 10/06/2019] insulin glargine  12 Units Subcutaneous Daily  . mouth rinse  15 mL Mouth Rinse 10 times per day  . QUEtiapine  100 mg Per Tube QHS  . sodium chloride flush  10-40 mL Intracatheter Q12H   Continuous Infusions: . sodium chloride 10 mL/hr at 09/25/19 1800     LOS: 41 days    Time spent: 30 minutes    Johnluke Haugen Darleen Crocker, DO [facility]  If 7PM-7AM, please contact night-coverage [email or web address] 10/05/2019, 10:00 AM

## 2019-10-05 NOTE — Progress Notes (Signed)
Referring Physician(s): Sarina Ill  Supervising Physician: Luanne Bras  Patient Status:  [facility] - In-pt  Chief Complaint: Follow up right ICA cervical/petrous junction stenosis s/p angioplasty 08/25/19 with post procedure right thigh/retroperitoneal hematoma and acute blood loss anemia.  Subjective:  Sleeping upon arrival to room but arouses easily with voice cues, follows commands, still unable to move left upper/lower extremity. No family/staff present during visit.  Allergies: Patient has no known allergies.  Medications: Prior to Admission medications   Medication Sig Start Date End Date Taking? Authorizing Provider  amLODipine (NORVASC) 10 MG tablet Take 1 tablet (10 mg total) by mouth daily. 05/12/19  Yes Minette Brine, FNP  aspirin EC 81 MG tablet Take 81 mg by mouth every evening.   Yes [provider]  Calcium Carbonate-Vitamin D (CALCIUM-D PO) Take 2 tablets by mouth daily at 12 noon.    Yes [provider]  carvedilol (COREG) 6.25 MG tablet TAKE 1 TABLET(6.25 MG) BY MOUTH TWICE DAILY Patient taking differently: Take 6.25 mg by mouth 2 (two) times daily with a meal.  07/31/19  Yes Minette Brine, FNP  hydrALAZINE (APRESOLINE) 50 MG tablet Take 1 tablet (50 mg total) by mouth 2 (two) times daily. 06/16/19 09/14/19 Yes Elouise Munroe, MD  Insulin Degludec-Liraglutide (XULTOPHY) 100-3.6 UNIT-MG/ML SOPN Inject 30 Units into the skin daily. 08/14/19  Yes Minette Brine, FNP  telmisartan-hydrochlorothiazide (MICARDIS HCT) 40-12.5 MG tablet Take 1 tablet by mouth daily. 07/31/19  Yes Minette Brine, FNP  ticagrelor (BRILINTA) 90 MG TABS tablet Take 1 tablet (90 mg total) by mouth 2 (two) times daily. 06/12/19  Yes Elouise Munroe, MD  vitamin C (ASCORBIC ACID) 250 MG tablet Take 500 mg by mouth daily at 12 noon.   Yes [provider]  blood glucose meter kit and supplies KIT Dispense based on patient and insurance preference. Use up to four times daily  as directed. (FOR ICD-9 250.00, 250.01). 05/10/18   Minette Brine, FNP  Blood Glucose Monitoring Suppl (TRUE METRIX METER) w/Device KIT 1 each by Does not apply route in the morning, at noon, in the evening, and at bedtime. 05/22/19   Minette Brine, FNP  Evolocumab (REPATHA SURECLICK) 096 MG/ML SOAJ Inject 140 mg into the skin every 14 (fourteen) days. 06/30/19   Elouise Munroe, MD  glucose blood (TRUE METRIX BLOOD GLUCOSE TEST) test strip Check blood sugar 4 times a day before meals and bedtime 05/22/19   Minette Brine, FNP  Insulin Glargine-Lixisenatide (SOLIQUA) 100-33 UNT-MCG/ML SOPN Inject 25 Units into the skin daily. Patient taking differently: Inject 30 Units into the skin daily.  07/14/19   Minette Brine, FNP  Multiple Vitamin (MULTIVITAMIN PO) Take 1 tablet by mouth daily at 12 noon.     [provider]  Omega-3 Fatty Acids (OMEGA-3 PLUS PO) Take 2 capsules by mouth daily at 12 noon.    [provider]     Vital Signs: BP (!) 146/61   Pulse (!) 110   Temp 98.7 F (37.1 C) (Axillary)   Resp 22   Ht 5' 1"  (1.549 m)   Wt 187 lb 9.8 oz (85.1 kg)   SpO2 98%   BMI 35.45 kg/m   Physical Exam Vitals and nursing note reviewed.  Constitutional:      General: She is not in acute distress. HENT:     Head: Normocephalic.  Cardiovascular:     Rate and Rhythm: Tachycardia present.     Comments: (+) Right CFA site soft  Pulmonary:     Comments: Tracheostomy Skin:    General: Skin is warm and dry.  Neurological:     Mental Status: She is alert. Mental status is at baseline.   Alert, awake Does not respond verbally to questions but follows commands PERRL - not assessed EOMs without nystagmus or subjective diplopia. Visual fields - not assessed No obvious facial asymmetry. Tongue midline - not assessed Motor power - full right upper/lower extremity, 0/5 left upper and lower extremity Pronator drift - not assessed. Fine motor and coordination - not assessed Gait  - not assessed Romberg - not assessed Heel to toe - not assessed Distal pulses - not assessed   Imaging: DG Chest 1 View  Result Date: 10/02/2019 CLINICAL DATA:  Hypoxemia EXAM: CHEST  1 VIEW COMPARISON:  09/22/2019 FINDINGS: Feeding catheter, tracheostomy tube and right-sided PICC line are again noted stable. Cardiac shadow is stable. Aortic calcifications are again seen. Previously seen lung opacities have improved over the interval from the prior exam. No sizable effusion is noted. IMPRESSION: Improved aeration bilaterally. Tubes and lines as described. Electronically Signed   By: Inez Catalina M.D.   On: 10/02/2019 11:37   DG Swallowing Func-Speech Pathology  Result Date: 10/02/2019 Objective Swallowing Evaluation: Type of Study: Bedside Swallow Evaluation  Patient Details Name: Donna [name] MRN: [id number] Date of Birth: 01-28-1951 Today's Date: 10/02/2019 Time: SLP Start Time (ACUTE ONLY): 7106 -SLP Stop Time (ACUTE ONLY): 1104 SLP Time Calculation (min) (ACUTE ONLY): 24 min Past Medical History: Past Medical History: Diagnosis Date . Anemia   low iron - not since having fibroids removed . Arthritis  . Hyperlipidemia  . Hypertension  . Sleep apnea   just recently diagnosed with it, has not gotten the Cpap (done 10/29/18) . Stroke [facility])   weakness on left side . Type II diabetes mellitus ([place])  Past Surgical History: Past Surgical History: Procedure Laterality Date . COLONOSCOPY   . IR ANGIO INTRA EXTRACRAN SEL COM CAROTID INNOMINATE BILAT MOD SED  10/18/2018 . IR ANGIO INTRA EXTRACRAN SEL COM CAROTID INNOMINATE BILAT MOD SED  08/18/2019 . IR ANGIO VERTEBRAL SEL SUBCLAVIAN INNOMINATE UNI R MOD SED  08/18/2019 . IR ANGIO VERTEBRAL SEL VERTEBRAL BILAT MOD SED  10/18/2018 . IR ANGIO VERTEBRAL SEL VERTEBRAL UNI L MOD SED  08/18/2019 . IR PTA INTRACRANIAL  08/25/2019 . IR US GUIDE VASC ACCESS RIGHT  10/18/2018 . IR US GUIDE VASC ACCESS RIGHT  08/18/2019 . IR US GUIDE VASC ACCESS RIGHT  08/25/2019 . MYOMECTOMY   .  RADIOLOGY WITH ANESTHESIA N/A 08/18/2019  Procedure: STENTING;  Surgeon: Luanne Bras, MD;  Location: [place];  Service: Radiology;  Laterality: N/A; . RADIOLOGY WITH ANESTHESIA N/A 08/25/2019  Procedure: STENTING;  Surgeon: Luanne Bras, MD;  Location: [place];  Service: Radiology;  Laterality: N/A; HPI: 69 y.o. female with PMH significant for HTN, HLD, CVA 06/2018, DM, anemia, obesity, OSA, and OA. Pt underwent R ICA angioplasty on 6/14. Pt with decline in neuro status and hemoglobin on 6/15, found to have a large R groin hematoma.  Pt has had Hbg 5.0 or less and is a Jehovah's witness. MRI showed scattered small acute infarcts along the right MCA/watershed territory. Solitary small acute infarct in the left parietal cortex and right cerebellum. She was intubated on 6/19. Pt underwent tracheostomy on 7/3. PT found to be septic with RLL PNA on 7/6. As of 10/01/19, not tolerating PMSV, noted stridor and air stacking despite now being in [place] #6 uncuffed. 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  Subjective: pt alert, upright in MBS chair, agreeable to evaluation Assessment / Plan / Recommendation CHL IP CLINICAL IMPRESSIONS 10/02/2019 Clinical Impression Ms. Mcdonagh demonstrates a mild pharyngeal dysphagia. She was noted to be fatigued after just having an x-ray completed and initially had high RR (32), which given time lowered to 18, more consistent with her baseline. Orally, all consistencies tested were WNL, however, pt deferred regular solid trials secondary to fatigue. Pharyngeally, pt was noted with reduced hyoid excursion, laryngeal elevation, and laryngeal vestibule closure, which resulted in trace deep penetration of thin liquids before the swallow. Oral prep phase was trialed as compensation, however, was ineffective. Pt was also unable to effectively utilize chin tuck strategy to prevent penetration. Given nectar thick liquids, she had no penetration or aspiration via cup or straw.  Straw use may be preferred as pt requires full assist for feeding and straw allows her to control bolus size. She consumed purees with good pharyngeal clearance/adequate pharyngeal constriction. Esophageal phase could not be viewed due to patient positioning and size. Recommend: nectar thick liquids via straw, purees (dysphagia 1), slow rate of feeding, meds crushed in puree, ongoing dysphagia treatment SLP Visit Diagnosis Dysphagia, unspecified (R13.10) Impact on safety and function Mild aspiration risk   CHL IP TREATMENT RECOMMENDATION 10/02/2019 Treatment Recommendations F/U FEES in --- days (Comment)   Prognosis 10/02/2019 Prognosis for Safe Diet Advancement Good Barriers to Reach Goals Cognitive deficits Barriers/Prognosis Comment -- CHL IP DIET RECOMMENDATION 10/02/2019 SLP Diet Recommendations Nectar thick liquid;Dysphagia 1 (Puree) solids Liquid Administration via Cup;Straw Medication Administration Crushed with puree Compensations Small sips/bites Postural Changes Remain semi-upright after after feeds/meals (Comment);Seated upright at 90 degrees   CHL IP OTHER RECOMMENDATIONS 10/02/2019 Recommended Consults Consider ENT evaluation Oral Care Recommendations Oral care QID Other Recommendations Order thickener from pharmacy   CHL IP FOLLOW UP RECOMMENDATIONS 10/02/2019 Follow up Recommendations Skilled Nursing facility;LTACH;24 hour supervision/assistance   CHL IP FREQUENCY AND DURATION 10/02/2019 Speech Therapy Frequency (ACUTE ONLY) min 2x/week Treatment Duration 2 weeks      CHL IP ORAL PHASE 10/02/2019 Oral Phase WFL  CHL IP PHARYNGEAL PHASE 10/02/2019 Pharyngeal Phase Impaired Pharyngeal- Nectar Teaspoon WFL Pharyngeal -- Pharyngeal- Nectar Cup WFL Pharyngeal -- Pharyngeal- Nectar Straw WFL Pharyngeal -- Pharyngeal- Thin Teaspoon Reduced epiglottic inversion;Reduced laryngeal elevation;Reduced airway/laryngeal closure;Penetration/Aspiration before swallow Pharyngeal -- Pharyngeal- Thin Cup Reduced epiglottic  inversion;Reduced laryngeal elevation;Reduced anterior laryngeal mobility;Reduced airway/laryngeal closure;Penetration/Aspiration before swallow Pharyngeal -- Pharyngeal- Thin Straw Reduced epiglottic inversion;Reduced anterior laryngeal mobility;Reduced laryngeal elevation;Reduced airway/laryngeal closure;Penetration/Aspiration before swallow Pharyngeal -- Pharyngeal- Puree WFL  CHL IP CERVICAL ESOPHAGEAL PHASE 10/02/2019 Cervical Esophageal Phase [facility] [place] P. Isenhour, M.S., CCC-SLP Speech-Language Pathologist Acute Rehabilitation Services Pager: [place] 10/02/2019, 11:32 AM               Labs:  CBC: Recent Labs    09/17/19 0703 09/18/19 0923 09/20/19 0500 09/26/19 0257  WBC 17.5* 15.3* 10.4 11.3*  HGB 5.6* 4.7* 4.7* 6.8*  HCT 19.0* 16.4* 17.5* 22.5*  PLT 448* 435* 447* 247    COAGS: Recent Labs    11/27/18 0728 08/18/19 0705 08/26/19 1715 08/27/19 0230  INR 0.9 0.9 1.1 1.2    BMP: Recent Labs    09/20/19 0500 09/21/19 0500 09/26/19 0537 10/04/19 0500  NA 144 142 141 140  K 3.1* 3.3* 3.8 4.0  CL 104 103 106 100  CO2 28 30 30  32  GLUCOSE 181* 145* 141* 260*  BUN 47* 41*  17 20  CALCIUM 8.5* 8.6* 8.7* 9.0  CREATININE 0.97 0.81 0.58 0.69  GFRNONAA >60 >60 >60 >60  GFRAA >60 >60 >60 >60    LIVER FUNCTION TESTS: Recent Labs    09/17/19 0703 09/20/19 0500 09/21/19 0500 09/26/19 0537  BILITOT 1.1 0.7 0.3 0.5  AST 32 27 31 31   ALT 23 29 36 42  ALKPHOS 82 95 81 57  PROT 6.6 7.4 7.1 5.5*  ALBUMIN 1.6* 1.5* 1.6* 1.8*    Assessment and Plan:  69 y/o F s/p right ICA cervical/petrous junction stenosis balloon angioplasty 08/25/19 with post procedure right thigh/retroperitoneal hematoma and acute blood loss anemia with refusal of blood products (Jehovah's Witness) seen today for routine follow up.  No significant neuro changes since previous assessment - still with left sided neglect, following commands, no speech today. No recent H/H due to  limited blood draws.  For possible LTAC/SNF placement - pending further swallow evals for possible removal of cortrak.   Continue to hold anticoagulation/antiplatelet medications. Further plans per TRH/neurology - appreciate and agree with management. IR will continue to follow, please call with any questions or concerns.  Electronically Signed: Joaquim Nam, PA-C 10/05/2019, 9:38 AM   I spent a total of 15 Minutes at the the patient's bedside AND on the patient's hospital floor or unit, greater than 50% of which was counseling/coordinating care for follow up right ICA angioplasty/post procedure hematoma/acute blood loss anemia.

## 2019-10-05 NOTE — Significant Event (Signed)
Rapid Response Event Note  Overview: Called urgently to pt's room while on unit seeing another pt d/t SpO2-60s on .28 TC.   Initial Focused Assessment: Pt laying in bed with eyes open in respiratory distress. +WOB, + accessory muscle use. SpO2-58% on .28 TC, HR-40s. Pt increased to .98 TC and trach suctioned. Large amount thick tan secretions suctioned out of trach. SpO2 increased to 100% with increased FiO2 from TC and HR increased to 120s after suctioning. TF already on hold on my arrival. Lungs coarse and rhonchus t/o, diminished in bases. Pt still with +WOB after interventions but, per RN, this is her baseline.   Interventions: .98 TC-titrated back down to .28 TC after a few minutes-SpO2-100%, HR-109(baseline for pt) PCXR Plan of Care (if not transferred): Pt's trach was near occluded causing hypoxia and bradycardia. Trach secretions look like TF. TF currently off.   Pt also getting dys I nectar thick liquid diet-?whether she is aspirating this as well. PCXR ordered. Notify MD of happenings and PCXR results as well as acquire as to whether TF needs to be restarted. Call RRT if further assistance needed.  Event Summary:  Called: 2125 Arrived:2125   Terrilyn Saver

## 2019-10-06 ENCOUNTER — Inpatient Hospital Stay (HOSPITAL_COMMUNITY): Payer: Medicare HMO

## 2019-10-06 DIAGNOSIS — G9341 Metabolic encephalopathy: Secondary | ICD-10-CM

## 2019-10-06 DIAGNOSIS — R5381 Other malaise: Secondary | ICD-10-CM

## 2019-10-06 DIAGNOSIS — K661 Hemoperitoneum: Secondary | ICD-10-CM

## 2019-10-06 DIAGNOSIS — G8194 Hemiplegia, unspecified affecting left nondominant side: Secondary | ICD-10-CM

## 2019-10-06 LAB — GLUCOSE, CAPILLARY
Glucose-Capillary: 107 mg/dL — ABNORMAL HIGH (ref 70–99)
Glucose-Capillary: 169 mg/dL — ABNORMAL HIGH (ref 70–99)
Glucose-Capillary: 200 mg/dL — ABNORMAL HIGH (ref 70–99)
Glucose-Capillary: 87 mg/dL (ref 70–99)
Glucose-Capillary: 317 mg/dL — ABNORMAL HIGH (ref 70–99)
Glucose-Capillary: 277 mg/dL — ABNORMAL HIGH (ref 70–99)

## 2019-10-06 LAB — RENAL FUNCTION PANEL
Albumin: 2.5 g/dL — ABNORMAL LOW (ref 3.5–5.0)
Anion gap: 11 (ref 5–15)
BUN: 16 mg/dL (ref 8–23)
CO2: 29 mmol/L (ref 22–32)
Calcium: 9.2 mg/dL (ref 8.9–10.3)
Chloride: 103 mmol/L (ref 98–111)
Creatinine, Ser: 0.71 mg/dL (ref 0.44–1.00)
GFR calc Af Amer: >60 mL/min (ref >60)
GFR calc non Af Amer: >60 mL/min (ref >60)
Glucose, Bld: 223 mg/dL — ABNORMAL HIGH (ref 70–99)
Phosphorus: 3.4 mg/dL (ref 2.5–4.6)
Potassium: 3.8 mmol/L (ref 3.5–5.1)
Sodium: 143 mmol/L (ref 135–145)

## 2019-10-06 LAB — BLOOD GAS, ARTERIAL
Acid-Base Excess: 5.4 mmol/L — ABNORMAL HIGH (ref 0.0–2.0)
Bicarbonate: 29.5 mmol/L — ABNORMAL HIGH (ref 20.0–28.0)
Drawn by: 535471
FIO2: 35
O2 Saturation: 98.6 %
Patient temperature: 37.6
pCO2 arterial: 45.8 mmHg (ref 32.0–48.0)
pH, Arterial: 7.428 (ref 7.350–7.450)
pO2, Arterial: 112 mmHg — ABNORMAL HIGH (ref 83.0–108.0)

## 2019-10-06 LAB — BRAIN NATRIURETIC PEPTIDE: B Natriuretic Peptide: 254 pg/mL — ABNORMAL HIGH (ref 0.0–100.0)

## 2019-10-06 MED ORDER — BUDESONIDE 0.5 MG/2ML IN SUSP
0.5000 mg | Freq: Two times a day (BID) | RESPIRATORY_TRACT | Status: DC
  Administered 2019-10-06 – 2019-11-01 (×52): 0.5 mg via RESPIRATORY_TRACT
  Filled 2019-10-06 (×53): qty 2

## 2019-10-06 MED ORDER — DEXTROSE-NACL 5-0.45 % IV SOLN: INTRAVENOUS | Status: DC

## 2019-10-06 MED ORDER — IOHEXOL 350 MG/ML SOLN
100.0000 mL | Freq: Once | INTRAVENOUS | Status: AC | PRN
  Administered 2019-10-06: 50 mL via INTRAVENOUS

## 2019-10-06 MED ORDER — LORAZEPAM 2 MG/ML IJ SOLN
INTRAMUSCULAR | Status: AC
  Filled 2019-10-06: qty 1

## 2019-10-06 MED ORDER — IPRATROPIUM-ALBUTEROL 0.5-2.5 (3) MG/3ML IN SOLN
3.0000 mL | RESPIRATORY_TRACT | Status: DC
  Administered 2019-10-06 – 2019-10-09 (×18): 3 mL via RESPIRATORY_TRACT
  Filled 2019-10-06 (×18): qty 3

## 2019-10-06 MED ORDER — LORAZEPAM 2 MG/ML IJ SOLN
0.5000 mg | Freq: Once | INTRAMUSCULAR | Status: AC
  Administered 2019-10-06: 0.5 mg via INTRAVENOUS

## 2019-10-06 MED ORDER — METHYLPREDNISOLONE SODIUM SUCC 125 MG IJ SOLR
125.0000 mg | Freq: Once | INTRAMUSCULAR | Status: AC
  Administered 2019-10-06: 125 mg via INTRAVENOUS
  Filled 2019-10-06: qty 2

## 2019-10-06 NOTE — Progress Notes (Signed)
NAME:  Donna [name], MRN:  [id number], DOB:  Dec 28, 1950, LOS: 42 ADMISSION DATE:  08/25/2019, CONSULTATION DATE:  08/27/2019 REFERRING MD:  Dr. 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  Admitted by Neuro IR on 6/14 for cerebral angiogram s/p RT ICA cervical/ petrous junction balloon angioplasty for severe stenosis via right femoral approach.  Was placed on ASA/ brillinta.  Monitored in Neuro ICU.  Noted to have developed hypotension, increased left sided weakness and lethargy on 6/15 am with Hgb drop from 13.3 to 6.9 with new AKI.  Post MRI/ MRA showed scattered small acute infarcts along the right MCA/ watershed territory suspected to be resultant either post procedure vs hypoperfusion.    A CT abd/ pelvis was obtained given concern for retroperitoneal bleed which showed a mild to moderate amount of retroperitoneal hematoma in the right iliac and inguinal regiones with moderate size hematoma noted the the soft tissues anterior to the musculature of the right hip; no definite intrapertioneal hemorrhage. INR, PT, and platelets rechecked and were wnl.  Vascular surgery was consulted with plans to observe given bleeding was mostly into the muscle and tissue.  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   7/4 - TRACHEOSTOMY  7/11 - pall  care meeting - full code. Patient feels less congested, ventilatory setting was adjusted to see if she can be liberated from ventilator, avoiding tissue hypoxemia  7/12 - afebrile since 7/8. + 22L since admit. RN feels patient volume overloaded.Does SBT dailly of varying duration few to several hours and then gets fatigued with tachypnea and tachycardia and agitation. Left sided dense hemiplegia continues. Periodically pruposeful on right side PRecedex continues but unsure if patient needs it/ Diarrhea + - on docusate  7/13 - On PSV via trach. Still unable to come off precedex gtt. ? Diarrhea btter after stopping docusate. No labs as part of blood conservation. Got lasix yesterday. Last dose zosyn today. Diarrhea + but improved after dc laxative. Needed prn for high BP last night. Trach sutures removed   Consults:  NIR - primary Neurology (stroke has signed off 6/24) Vascular surgery  PCCM Palliative Care Hematology  Procedures:   ETT 6/19 > 6/23,  6/23 > Trach 7/4  Significant Diagnostic Tests:   6/14 cerebral angiogram s/p RT ICA cer/petrous junction balloon angioplasty for severe stenosis.   6/15 MRA/ MRI brain  > Scattered small acute infarcts along the right MCA/watershed territory. Solitary small acute infarct in the left parietal cortex and right cerebellum. Improved right ICA patency at the petrous segment. Known severe right M1 segment stenosis. 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  6/15 CT a/p wo contrast > Mild to moderate amount of retroperitoneal hematoma is noted in the right iliac and inguinal regions. Moderate size hematoma is noted in the soft tissues anterior to the musculature of the right hip. No definite intraperitoneal hemorrhage is noted. Moderate size fat  containing periumbilical hernia. Aortic Atherosclerosis.   Echo 6/16 > normal LVEF  6/29 MRI brain > Progressed size and confluence of ischemia throughout much of the right hemisphere white  matter since 08/26/2019. Associated petechial blood products but no malignant hemorrhagic transformation or mass Effect. There are also multiple new lacunar type infarcts elsewhere, including the brainstem and the left basal ganglia. Underlying chronic infarcts of the right basal ganglia and left Cerebellum.   Micro Data:  6/10 SARS 2 >> neg 6/14 MRSA PCR >> neg 6/20 trach asp >> few diptheroids/ corynebacterium species 6/18 BC x 2 >> neg 7/6 Morganella  Antimicrobials:  6/14 cefazolin pre-op  6/20 unasyn >> 09/02/2019 7/6 vancomycin > 7/8 7/6 Zosyn>> 7/12 (Morganella)  Interim history/subjective:  Called back regarding respiratory distress on 7/26. Patient has had tachypnea and desaturations. Rapid response was called last night and noted thick tan secretions and near trach occlusion. I suctioned her on evaluation and only got thin secretions.   Objective   Blood pressure (!) 167/74, pulse (!) 118, temperature 99.6 F (37.6 C), temperature source Oral, resp. rate (!) 30, height 5\' 1"  (1.549 m), weight 78.9 kg, SpO2 100 %.    FiO2 (%):  [28 %-98 %] 60 %   Intake/Output Summary (Last 24 hours) at 10/06/2019 1142 Last data filed at 10/06/2019 0500 Gross per 24 hour  Intake 10 ml  Output 1400 ml  Net -1390 ml   Filed Weights   10/02/19 0500 10/03/19 0259 10/06/19 0400  Weight: 84.6 kg 85.1 kg 78.9 kg    Examination: General: chronically ill appearing female in bed. Mild resp disterss.  HEENT: [place]/AT, PERRL, Trach in place 6 shiley cuffless CDI.  Neuro: Awake, alert, follows commands on R.  CV: s1s2 regular rate and rhythm, no murmur, rubs, or gallops,  PULM: Rhonchi, clears with suctioning.  GI: soft, bowel sounds active in all 4 quadrants, non-tender, non-distended, tolerating TF Extremities: No acute deformity. Unna boots in place.  Skin: no rashes or lesions  Resolved Hospital Problem list   Hypotension  AKI Sepsis VAP - Morganella s/p zosyn ending  09/23/19  Assessment & Plan:    Acute on chronic respiratory failure with hypoxia requiring mechanical ventilation.   -Failed trial of extubation due to upper airway stridor. Cords visibly scarred on reintubation.  -S/P trach 7/4 Now complicated by worsening hypoxia 7/26 P: ATC increased to 60% FiO2 with O2 sats now 100%. Tracheal suctioning as indicated Start Duonebs ABG, CBC, CMP Chest PT CTA chest pending per primary Routine trach care Mobilize as able Will continue to monitor in PCU. Low threshold to change to cuffed trach to allow vent support if worsens.   Right MCA CVA due to bilateral intracranial stenoses  -s/p RT ICA cer/petrous junction balloon angioplasty for severe stenosis c/b right thigh and retroperitoneal hematoma.  Acute encephalopathy -Continues to slowly show improvement P: Per primary Ongoing PT/OT  Other acute versus chronic medical conditions including: Volume Overload with hypertension Acute blood loss anemia superimposed on anemia of chronic illness Diarrhea Uncontrolled diabetes with hyperglycemia Now being managed by primary team, [facility]  Will follow along.    Best practice  Diet: Tube feeds, slow rate DVT prophylaxis: Lovenox GI prophylaxis: Protonix Mobility: PT/OT CODE STATUS: Full code Disposition: Floor Family updated: Per primary  LABS    PULMONARY No results for input(s): PHART, PCO2ART, PO2ART, HCO3, TCO2, O2SAT in the last 168 hours.  Invalid input(s): PCO2, PO2  CBC No results for input(s): HGB, HCT, WBC,  PLT in the last 168 hours.  COAGULATION No results for input(s): INR in the last 168 hours.  CARDIAC  No results for input(s): TROPONINI in the last 168 hours. No results for input(s): PROBNP in the last 168 hours.   CHEMISTRY Recent Labs  Lab 10/04/19 0500  NA 140  K 4.0  CL 100  CO2 32  GLUCOSE 260*  BUN 20  CREATININE 0.69  CALCIUM 9.0  MG 2.0   Estimated Creatinine Clearance: 64 mL/min  (by C-G formula based on SCr of 0.69 mg/dL).   LIVER No results for input(s): AST, ALT, ALKPHOS, BILITOT, PROT, ALBUMIN, INR in the last 168 hours.   INFECTIOUS No results for input(s): LATICACIDVEN, PROCALCITON in the last 168 hours.   ENDOCRINE CBG (last 3)  Recent Labs    10/05/19 2322 10/06/19 0406 10/06/19 0814  GLUCAP 262* 107* 169*    IMAGING x48h  - image(s) personally visualized  -   highlighted in bold DG Chest Port 1 View  Result Date: 10/06/2019 CLINICAL DATA:  Shortness of breath EXAM: PORTABLE CHEST 1 VIEW COMPARISON:  10/05/2019 FINDINGS: Stable positioning of tracheostomy tube. Right-sided PICC line in terminating at the distal SVC. Enteric tube courses below the diaphragm, distal tip extending beyond the inferior margin of the film. Stable cardiomediastinal contours. Atherosclerotic calcification of the aortic knob. No new focal airspace consolidation. No pleural effusion or pneumothorax. IMPRESSION: No acute cardiopulmonary findings. Stable lines and tubes, as above. Electronically Signed   By: Duanne Guess D.O.   On: 10/06/2019 11:08   DG Chest Port 1 View  Result Date: 10/05/2019 CLINICAL DATA:  Acute respiratory distress EXAM: PORTABLE CHEST 1 VIEW COMPARISON:  10/02/2019 FINDINGS: Tracheostomy, right upper extremity PICC line, and nasoenteric feeding tube extending into the upper abdomen beyond the margin of the examination are unchanged. Lungs are clear. No pneumothorax or pleural effusion. Cardiac size within normal limits. Pulmonary vascularity is normal. IMPRESSION: Stable examination. No focal pulmonary infiltrate identified. Preserved pulmonary insufflation. Electronically Signed   By: Helyn Numbers MD   On: 10/05/2019 22:16     Signature:    Joneen Roach, AGACNP-BC [facility]  See Amion for personal pager PCCM on call pager [phone]  10/06/2019 12:03 PM

## 2019-10-06 NOTE — Progress Notes (Signed)
Referring Physician(s): Melvenia Beam  Supervising Physician: Luanne Bras  Patient Status:  [facility] - In-pt  Chief Complaint: None-tracheostomy without sedation  Subjective:  Right ICA cervical/petrous junction stenosis s/p revascularization using balloon angioplasty via right radial and right femoral approach6/14/2021by Dr. Estanislado Pandy. Mild-moderate right thigh (with some retroperitoneal component) hematoma. Acute blood loss anemia, Jehovah's Witness. Patientawakelaying in bed, tracheostomy in place,no sedation.Shefollowssimple commandsand isalert today. Nods head appropriately to simple yes/no questions. Had eventful morning per notes- increasing respiratory distress with increasing suction/O2 supplementation needs (rapid response called this AM- required suctioning and bagging briefly for mucous removal); currently sating 98-99% on tracheostomy. Follows commands with right side, demonstrates neglectof left side. Right groin and right radial puncture sites c/d/i. Most recenthgbwas6.8 09/26/2019 (up from4.7 on 09/20/2019). No family at bedside this AM.   Allergies: Patient has no known allergies.  Medications: Prior to Admission medications   Medication Sig Start Date End Date Taking? Authorizing Provider  amLODipine (NORVASC) 10 MG tablet Take 1 tablet (10 mg total) by mouth daily. 05/12/19  Yes Minette Brine, FNP  aspirin EC 81 MG tablet Take 81 mg by mouth every evening.   Yes [provider]  Calcium Carbonate-Vitamin D (CALCIUM-D PO) Take 2 tablets by mouth daily at 12 noon.    Yes [provider]  carvedilol (COREG) 6.25 MG tablet TAKE 1 TABLET(6.25 MG) BY MOUTH TWICE DAILY Patient taking differently: Take 6.25 mg by mouth 2 (two) times daily with a meal.  07/31/19  Yes Minette Brine, FNP  hydrALAZINE (APRESOLINE) 50 MG tablet Take 1 tablet (50 mg total) by mouth 2 (two) times daily. 06/16/19 09/14/19 Yes Elouise Munroe, MD  Insulin  Degludec-Liraglutide (XULTOPHY) 100-3.6 UNIT-MG/ML SOPN Inject 30 Units into the skin daily. 08/14/19  Yes Minette Brine, FNP  telmisartan-hydrochlorothiazide (MICARDIS HCT) 40-12.5 MG tablet Take 1 tablet by mouth daily. 07/31/19  Yes Minette Brine, FNP  ticagrelor (BRILINTA) 90 MG TABS tablet Take 1 tablet (90 mg total) by mouth 2 (two) times daily. 06/12/19  Yes Elouise Munroe, MD  vitamin C (ASCORBIC ACID) 250 MG tablet Take 500 mg by mouth daily at 12 noon.   Yes [provider]  blood glucose meter kit and supplies KIT Dispense based on patient and insurance preference. Use up to four times daily as directed. (FOR ICD-9 250.00, 250.01). 05/10/18   Minette Brine, FNP  Blood Glucose Monitoring Suppl (TRUE METRIX METER) w/Device KIT 1 each by Does not apply route in the morning, at noon, in the evening, and at bedtime. 05/22/19   Minette Brine, FNP  Evolocumab (REPATHA SURECLICK) 947 MG/ML SOAJ Inject 140 mg into the skin every 14 (fourteen) days. 06/30/19   Elouise Munroe, MD  glucose blood (TRUE METRIX BLOOD GLUCOSE TEST) test strip Check blood sugar 4 times a day before meals and bedtime 05/22/19   Minette Brine, FNP  Insulin Glargine-Lixisenatide (SOLIQUA) 100-33 UNT-MCG/ML SOPN Inject 25 Units into the skin daily. Patient taking differently: Inject 30 Units into the skin daily.  07/14/19   Minette Brine, FNP  Multiple Vitamin (MULTIVITAMIN PO) Take 1 tablet by mouth daily at 12 noon.     [provider]  Omega-3 Fatty Acids (OMEGA-3 PLUS PO) Take 2 capsules by mouth daily at 12 noon.    [provider]     Vital Signs: BP (!) 150/87   Pulse (!) 118   Temp 99.6 F (37.6 C) (Oral)   Resp 23   Ht _0  (1.549  m)   Wt 173 lb 15.1 oz (78.9 kg)   SpO2 100%   BMI 32.87 kg/m   Physical Exam Constitutional:      General: She is not in acute distress.    Comments: Tracheostomy without sedation.   Pulmonary:     Effort: Pulmonary effort is normal. No respiratory  distress.     Comments: Tracheostomy without sedation.  Skin:    General: Skin is warm and dry.     Comments: Gross anasarca. Right groin puncture site soft without active bleeding or hematoma. Right radial puncture site soft without active bleeding or hematoma.   Neurological:     Mental Status: She is alert.     Comments: Tracheostomy without sedation. Alert today, nods to yes/no questions appropriately, follows simple commands of right side. PERRL bilaterally. Follows commands with right side (wiggles right toes and bends right knee, grips with right hand and lifts LUE over head), demonstrates neglect of left side. Distal pulses (DPs) 1+ bilaterally and (radial) 2+ bilaterally.      Imaging: DG Chest Port 1 View  Result Date: 10/06/2019 CLINICAL DATA:  Shortness of breath EXAM: PORTABLE CHEST 1 VIEW COMPARISON:  10/05/2019 FINDINGS: Stable positioning of tracheostomy tube. Right-sided PICC line in terminating at the distal SVC. Enteric tube courses below the diaphragm, distal tip extending beyond the inferior margin of the film. Stable cardiomediastinal contours. Atherosclerotic calcification of the aortic knob. No new focal airspace consolidation. No pleural effusion or pneumothorax. IMPRESSION: No acute cardiopulmonary findings. Stable lines and tubes, as above. Electronically Signed   By: Davina Poke D.O.   On: 10/06/2019 11:08   DG Chest Port 1 View  Result Date: 10/05/2019 CLINICAL DATA:  Acute respiratory distress EXAM: PORTABLE CHEST 1 VIEW COMPARISON:  10/02/2019 FINDINGS: Tracheostomy, right upper extremity PICC line, and nasoenteric feeding tube extending into the upper abdomen beyond the margin of the examination are unchanged. Lungs are clear. No pneumothorax or pleural effusion. Cardiac size within normal limits. Pulmonary vascularity is normal. IMPRESSION: Stable examination. No focal pulmonary infiltrate identified. Preserved pulmonary insufflation. Electronically  Signed   By: Fidela Salisbury MD   On: 10/05/2019 22:16    Labs:  CBC: Recent Labs    09/17/19 0703 09/18/19 0923 09/20/19 0500 09/26/19 0257  WBC 17.5* 15.3* 10.4 11.3*  HGB 5.6* 4.7* 4.7* 6.8*  HCT 19.0* 16.4* 17.5* 22.5*  PLT 448* 435* 447* 247    COAGS: Recent Labs    11/27/18 0728 08/18/19 0705 08/26/19 1715 08/27/19 0230  INR 0.9 0.9 1.1 1.2    BMP: Recent Labs    09/21/19 0500 09/26/19 0537 10/04/19 0500 10/06/19 1155  NA 142 141 140 143  K 3.3* 3.8 4.0 3.8  CL 103 106 100 103  CO2 30 30 32 29  GLUCOSE 145* 141* 260* 223*  BUN 41* _0 CALCIUM 8.6* 8.7* 9.0 9.2  CREATININE 0.81 0.58 0.69 0.71  GFRNONAA >60 >60 >60 >60  GFRAA >60 >60 >60 >60    LIVER FUNCTION TESTS: Recent Labs    09/17/19 0703 09/17/19 0703 09/20/19 0500 09/21/19 0500 09/26/19 0537 10/06/19 1155  BILITOT 1.1  --  0.7 0.3 0.5  --   AST 32  --  _1 --   ALT 23  --  29 36 42  --   ALKPHOS 82  --  95 81 57  --   PROT 6.6  --  7.4 7.1 5.5*  --   ALBUMIN 1.6*   < >  1.5* 1.6* 1.8* 2.5*   < > = values in this interval not displayed.    Assessment and Plan:  Right ICA cervical/petrous junction stenosis s/p revascularization using balloon angioplasty via right radial and right femoral approach6/14/2021by Dr. Estanislado Pandy. Mild-moderate right thigh (with some retroperitoneal component) hematoma. Acute blood loss anemia, Jehovah's Witness. Patient's conditionstable-tracheostomy withoutsedation, alert today, follows simple commands of right side, demonstrates neglectof left side. Right groinand right radial puncture sitesstable, no signs of active bleeding. Hgb6.8 09/26/2019-receiving supplementation per severe anemia protocol. CTA chest ordered to R/O PE. For possible transfer to SNF (insurance denied LTAC), awaiting family decision regarding SNF placement. Continue to hold Brilinta/Aspirin/Lovenox.Agree with CCM plans toavoid excess blood drawl. Further plans  per [facility]/neurology/palliative care- appreciate and agree with management. NIR to follow.   Electronically Signed: Earley Abide, PA-C 10/06/2019, 1:15 PM   I spent a total of 25 Minutes at the the patient's bedside AND on the patient's hospital floor or unit, greater than 50% of which was counseling/coordinating care for right ICA stenosis s/p revascularization.

## 2019-10-06 NOTE — Progress Notes (Signed)
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  Admitted by Neuro IR on 6/14 for cerebral angiogram s/p RT ICA cervical/ petrous junction balloon angioplasty for severe stenosis via right femoral approach. Was placed on ASA/ brillinta. Monitored in Neuro ICU. Noted to have developed hypotension, increased left sided weakness and lethargy on 6/15 am with Hgb drop from 13.3 to 6.9 with new AKI. Post MRI/ MRA showed scattered small acute infarcts along the right MCA/ watershed territory suspected to be resultant either post procedure vs hypoperfusion. Continues to have left-sided neglect.  A CT abd/ pelvis was obtained given concern for retroperitoneal bleed which showed a mild to moderate amount of retroperitoneal hematoma in the right iliac and inguinal regiones with moderate size hematoma noted the the soft tissues anterior to the musculature of the right hip; no definite intrapertioneal hemorrhage. INR, PT, and platelets rechecked and were wnl. Vascular surgery was consulted with plans to observe given bleeding was mostly into the muscle and tissue.   Patient developed respiratory distress with significant desaturation the night of 7/25-7/26.  CXR without acute finding. Tube feed on hold given concern for aspiration.  PCCM consulted.  CTA chest pending.  Subjective: Seen and examined earlier this morning.  She had respiratory distress with significant desaturation overnight, and again this morning.  She also has notable work of breathing.  CXR without acute finding.  She is not able to provide history but  follows commands.   Objective: Vitals:   10/06/19 0728 10/06/19 1100 10/06/19 1103 10/06/19 1200  BP:  (!) 159/76  (!) 150/87  Pulse: 102  (!) 118   Resp: 22 21 (!) 27 23  Temp:      TempSrc:      SpO2: 100% 100% 100% 100%  Weight:      Height:        Intake/Output Summary (Last 24 hours) at 10/06/2019 1242 Last data filed at 10/06/2019 0500 Gross per 24 hour  Intake 10 ml  Output 1400 ml  Net -1390 ml   Filed Weights   10/02/19 0500 10/03/19 0259 10/06/19 0400  Weight: 84.6 kg 85.1 kg 78.9 kg    Examination:  GENERAL: No apparent distress.  Nontoxic. HEENT: MMM.  Vision and hearing grossly intact.  NECK: Tracheostomy. RESP: On 10 L via trach. IWOB.  Rhonchi bilaterally. CVS:  RRR. Heart sounds normal.  ABD/GI/GU: BS+. Abd soft, NTND.  MSK/EXT:  Moves extremities. No apparent deformity. No edema.  SKIN: no apparent skin lesion or wound NEURO: Awake.  Follows commands.  Left hemiparesis/neglect. PSYCH: Calm.  No agitation.  Procedures:  Right ICA junction balloon angioplasty Tracheostomy  Microbiology summarized: MRSA PCR negative. 6/18-blood cultures negative. 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  -No anticoagulation or antiplatelet given bleed as above -On tube feed via core track.  May need PEG tube going forward unless oral intake improves. -Final disposition likely SNF if stable -Neurology signed off.  Acutemetabolicencephalopathy: Follows some  commands with right-sided upper and lower extremity movements - cotninue seroquel at 160m QHS (has helped come off precedex) -monitor QTc intermittently.  Acute on chronic respiratory failure with  hypoxia: Failed trial of extubation due to upper airway stridor. Cords visibly scarred on reintubation.  Tracheostomy on 7/4> trach collar.  There were no respiratory distress overnight and this morning.  Initially improved with suction.  Concern about aspiration of tube feed per RT and RN report overnight.  CXR without significant finding. -PCCM consulted.  Tube feed on hold. -IV Solu-Medrol 125 mg once -Pulmicort and DuoNeb -CTA chest to exclude PE. -Check BNP.  Dysphagia: Likely due to CVA. -Aspiration precaution -Tube feed on hold due to risk for aspiration -Continue D5-1/2NS  Volume Overload with hypertension: Appears euvolemic on exam.  Intermittently received IV Lasix. - Goal to keep SBP<160 given intracranial stenosis.  Acute blood loss anemia ->Anemia of critical illness: Jehovah's Witness, no blood products including albumin.Hemoglobin improved to 6.8 today. Continue severe anemia protocol: Iron, Folic acid, vitamin BC12 Monitor off Epogen and Vit C that was previously given -Intermittently monitor H&H  Diarrhea-resolved -most likely due to tube feedings -Continue to monitor  Uncontrolled diabetes with hyperglycemia: A1c 5.2%. Recent Labs  Lab 10/05/19 2036 10/05/19 2322 10/06/19 0406 10/06/19 0814 10/06/19 1204  GLUCAP 283* 262* 107* 169* 200*  -Continue Levemir 12 units -Continue SSI  Debility/physical deconditioning -Continue PT/OT when medically stable.  Class II obesity Body mass index is 32.87 kg/m. Nutrition Problem: Inadequate oral intake Etiology: inability to eat Signs/Symptoms: NPO status Interventions: Magic cup, Tube feeding, Hormel Shake   DVT prophylaxis:  Place and maintain sequential compression device Start: 09/26/19 1319  Code Status: Full code Family Communication: Patient and/or RN. Available if any question.  Status is: Inpatient  Remains inpatient appropriate because:Hemodynamically unstable, Ongoing diagnostic testing  needed not appropriate for outpatient work up, IV treatments appropriate due to intensity of illness or inability to take PO and Inpatient level of care appropriate due to severity of illness   Dispo: The patient is from: Home              Anticipated d/c is to: SNF.  Insurance denied LTAC.              Anticipated d/c date is: > 3 days              Patient currently is not medically stable to d/c.       Consultants:  PCCM Neurology Interventional radiology   Sch Meds:  Scheduled Meds: . amLODipine  10 mg Per Tube Daily  . atorvastatin  40 mg Per NG tube Daily  . budesonide (PULMICORT) nebulizer solution  0.5 mg Nebulization BID  . chlorhexidine gluconate (MEDLINE KIT)  15 mL Mouth Rinse BID  . Chlorhexidine Gluconate Cloth  6 each Topical Daily  . famotidine  20 mg Per Tube BID  . feeding supplement (JEVITY 1.2 CAL)  900 mL Per Tube Q24H  . feeding supplement (PROSource TF)  45 mL Per Tube TID  . ferrous sulfate  220 mg Per Tube TID WC  . hydrALAZINE  100 mg Per Tube Q6H  . hydrocerin   Topical TID  . insulin aspart  0-15 Units Subcutaneous Q4H  . insulin aspart  4 Units Subcutaneous Q4H  . insulin glargine  12 Units Subcutaneous Daily  . ipratropium-albuterol  3 mL Nebulization Q4H  . mouth rinse  15 mL Mouth Rinse 10 times per day  . QUEtiapine  100  mg Per Tube QHS  . sodium chloride flush  10-40 mL Intracatheter Q12H   Continuous Infusions: . sodium chloride 10 mL/hr at 09/25/19 1800  . dextrose 5 % and 0.45% NaCl 50 mL/hr at 10/06/19 1216   PRN Meds:.acetaminophen **OR** acetaminophen (TYLENOL) oral liquid 160 mg/5 mL **OR** acetaminophen, albuterol, fentaNYL (SUBLIMAZE) injection, hydrALAZINE, labetalol, lip balm, oxyCODONE, sodium chloride flush  Antimicrobials: Anti-infectives (From admission, onward)   Start     Dose/Rate Route Frequency Ordered Stop   09/16/19 2130  vancomycin (VANCOCIN) IVPB 1000 mg/200 mL premix  Status:  Discontinued        1,000 mg 200  mL/hr over 60 Minutes Intravenous Every 12 hours 09/16/19 0839 09/18/19 0756   09/16/19 1630  piperacillin-tazobactam (ZOSYN) IVPB 3.375 g        3.375 g 12.5 mL/hr over 240 Minutes Intravenous Every 8 hours 09/16/19 0835 09/23/19 1153   09/16/19 1000  ceFEPIme (MAXIPIME) 1 g in sodium chloride 0.9 % 100 mL IVPB  Status:  Discontinued        1 g 200 mL/hr over 30 Minutes Intravenous Every 12 hours 09/16/19 0756 09/16/19 0803   09/16/19 0845  piperacillin-tazobactam (ZOSYN) IVPB 3.375 g        3.375 g 100 mL/hr over 30 Minutes Intravenous  Once 09/16/19 0834 09/16/19 1007   09/16/19 0845  vancomycin (VANCOREADY) IVPB 1500 mg/300 mL        1,500 mg 150 mL/hr over 120 Minutes Intravenous  Once 09/16/19 0839 09/16/19 1248   09/16/19 0830  piperacillin-tazobactam (ZOSYN) IVPB 3.375 g  Status:  Discontinued        3.375 g 12.5 mL/hr over 240 Minutes Intravenous Every 8 hours 09/16/19 0803 09/16/19 0834   09/16/19 0800  vancomycin (VANCOCIN) IVPB 1000 mg/200 mL premix  Status:  Discontinued        1,000 mg 200 mL/hr over 60 Minutes Intravenous Every 12 hours 09/16/19 0756 09/16/19 0839   08/31/19 1400  Ampicillin-Sulbactam (UNASYN) 3 g in sodium chloride 0.9 % 100 mL IVPB  Status:  Discontinued        3 g 200 mL/hr over 30 Minutes Intravenous Every 6 hours 08/31/19 1357 09/02/19 0819   08/25/19 0723  ceFAZolin (ANCEF) IVPB 2g/100 mL premix        2 g 200 mL/hr over 30 Minutes Intravenous 60 min pre-op 08/25/19 0723 08/25/19 0930       I have personally reviewed the following labs and images: CBC: No results for input(s): WBC, NEUTROABS, HGB, HCT, MCV, PLT in the last 168 hours. BMP &GFR Recent Labs  Lab 10/04/19 0500 10/06/19 1155  NA 140 143  K 4.0 3.8  CL 100 103  CO2 32 29  GLUCOSE 260* 223*  BUN 20 16  CREATININE 0.69 0.71  CALCIUM 9.0 9.2  MG 2.0  --   PHOS  --  3.4   Estimated Creatinine Clearance: 64 mL/min (by C-G formula based on SCr of 0.71 mg/dL). Liver &  Pancreas: Recent Labs  Lab 10/06/19 1155  ALBUMIN 2.5*   No results for input(s): LIPASE, AMYLASE in the last 168 hours. No results for input(s): AMMONIA in the last 168 hours. Diabetic: Recent Labs    10/03/19 1630  HGBA1C 5.2   Recent Labs  Lab 10/05/19 2036 10/05/19 2322 10/06/19 0406 10/06/19 0814 10/06/19 1204  GLUCAP 283* 262* 107* 169* 200*   Cardiac Enzymes: No results for input(s): CKTOTAL, CKMB, CKMBINDEX, TROPONINI in the last 168 hours. No results  for input(s): PROBNP in the last 8760 hours. Coagulation Profile: No results for input(s): INR, PROTIME in the last 168 hours. Thyroid Function Tests: No results for input(s): TSH, T4TOTAL, FREET4, T3FREE, THYROIDAB in the last 72 hours. Lipid Profile: No results for input(s): CHOL, HDL, LDLCALC, TRIG, CHOLHDL, LDLDIRECT in the last 72 hours. Anemia Panel: No results for input(s): VITAMINB12, FOLATE, FERRITIN, TIBC, IRON, RETICCTPCT in the last 72 hours. Urine analysis:    Component Value Date/Time   COLORURINE AMBER (A) 09/16/2019 0931   APPEARANCEUR HAZY (A) 09/16/2019 0931   LABSPEC 1.021 09/16/2019 0931   PHURINE 5.0 09/16/2019 0931   GLUCOSEU NEGATIVE 09/16/2019 0931   HGBUR NEGATIVE 09/16/2019 0931   BILIRUBINUR NEGATIVE 09/16/2019 0931   BILIRUBINUR negative 01/02/2018 1636   KETONESUR NEGATIVE 09/16/2019 0931   PROTEINUR 30 (A) 09/16/2019 0931   UROBILINOGEN 0.2 01/02/2018 1636   NITRITE NEGATIVE 09/16/2019 0931   LEUKOCYTESUR SMALL (A) 09/16/2019 0931   Sepsis Labs: Invalid input(s): PROCALCITONIN, [place]  Microbiology: No results found for this or any previous visit (from the past 240 hour(s)).  Radiology Studies: DG Chest Port 1 View  Result Date: 10/06/2019 CLINICAL DATA:  Shortness of breath EXAM: PORTABLE CHEST 1 VIEW COMPARISON:  10/05/2019 FINDINGS: Stable positioning of tracheostomy tube. Right-sided PICC line in terminating at the distal SVC. Enteric tube courses below the  diaphragm, distal tip extending beyond the inferior margin of the film. Stable cardiomediastinal contours. Atherosclerotic calcification of the aortic knob. No new focal airspace consolidation. No pleural effusion or pneumothorax. IMPRESSION: No acute cardiopulmonary findings. Stable lines and tubes, as above. Electronically Signed   By: Davina Poke D.O.   On: 10/06/2019 11:08   DG Chest Port 1 View  Result Date: 10/05/2019 CLINICAL DATA:  Acute respiratory distress EXAM: PORTABLE CHEST 1 VIEW COMPARISON:  10/02/2019 FINDINGS: Tracheostomy, right upper extremity PICC line, and nasoenteric feeding tube extending into the upper abdomen beyond the margin of the examination are unchanged. Lungs are clear. No pneumothorax or pleural effusion. Cardiac size within normal limits. Pulmonary vascularity is normal. IMPRESSION: Stable examination. No focal pulmonary infiltrate identified. Preserved pulmonary insufflation. Electronically Signed   By: Fidela Salisbury MD   On: 10/05/2019 22:16     Cheyrl Buley T. Borger  If 7PM-7AM, please contact night-coverage [email or web address] Password [facility] 10/06/2019, 12:42 PM

## 2019-10-06 NOTE — Progress Notes (Signed)
Pt monitor was beeping and upon entering room saw pt 02 sats fluctuating between 58-60%, turned oxygen to 8L and elevated HOB, RRT was on the unit and was asked by this nurse to come and evaluate pt-same was done.  2200 Pt stabilized and suctioned, RRT notified Physician and STAT PCXR ordered.  2205  Portable CXR done at this time. Will notify on call physician when resulted.  2305 RRT spoke with physician and relayed xray results, new orders to hold t/f and keep NPO-same done.

## 2019-10-06 NOTE — Progress Notes (Signed)
PT Cancellation Note  Patient Details Name: Donna [name] MRN: [id number] DOB: 28-Mar-1950   Cancelled Treatment:    Reason Eval/Treat Not Completed: Medical issues which prohibited therapy patient had rapid response last night and per RN medical status is still tremulous. Will attempt to check back later in the day if time/schedule allow, otherwise will re-attempt on next date of service.    Madelaine Etienne, DPT, PN1   Supplemental Physical Therapist [facility]    Pager [phone] Acute Rehab Office [phone]

## 2019-10-06 NOTE — Progress Notes (Signed)
Pt was transported to CT scan. Pt exhibited sings of high stress and anxiety during the scanning (HR 130s, RR 30s, excessively sweating, unable to stay still). MD is notified and order received to give 0.5 mg IV Ativan. Scan was completed. VSs are stable, HR 119, RR24, O2 100 on trach color.

## 2019-10-06 NOTE — Progress Notes (Signed)
RT called patient was desating in the 40's. Upon RT arrival, patients sats were 55%. RT bagged patient and suctioned out with minimal secretions. Inner cannula cleaned, but wasn't occluded. Patient has increased WOB. Sats are back up to 100%. RN notified MD.

## 2019-10-07 LAB — GLUCOSE, CAPILLARY
Glucose-Capillary: 198 mg/dL — ABNORMAL HIGH (ref 70–99)
Glucose-Capillary: 183 mg/dL — ABNORMAL HIGH (ref 70–99)
Glucose-Capillary: 111 mg/dL — ABNORMAL HIGH (ref 70–99)
Glucose-Capillary: 180 mg/dL — ABNORMAL HIGH (ref 70–99)
Glucose-Capillary: 160 mg/dL — ABNORMAL HIGH (ref 70–99)
Glucose-Capillary: 165 mg/dL — ABNORMAL HIGH (ref 70–99)

## 2019-10-07 LAB — CBC
HCT: 23.5 % — ABNORMAL LOW (ref 36.0–46.0)
Hemoglobin: 6.6 g/dL — CL (ref 12.0–15.0)
MCH: 28.6 pg (ref 26.0–34.0)
MCHC: 28.1 g/dL — ABNORMAL LOW (ref 30.0–36.0)
MCV: 101.7 fL — ABNORMAL HIGH (ref 80.0–100.0)
Platelets: 262 10*3/uL (ref 150–400)
RBC: 2.31 MIL/uL — ABNORMAL LOW (ref 3.87–5.11)
RDW: 18.7 % — ABNORMAL HIGH (ref 11.5–15.5)
WBC: 7 10*3/uL (ref 4.0–10.5)
nRBC: 0 % (ref 0.0–0.2)

## 2019-10-07 MED ORDER — JEVITY 1.2 CAL PO LIQD
1000.0000 mL | ORAL | Status: DC
  Administered 2019-10-07 – 2019-10-09 (×2): 1000 mL
  Filled 2019-10-07 (×5): qty 1000

## 2019-10-07 MED ORDER — JEVITY 1.2 CAL PO LIQD
900.0000 mL | ORAL | Status: DC
  Filled 2019-10-07: qty 948

## 2019-10-07 NOTE — Progress Notes (Signed)
NAME:  Donna [name], MRN:  [id number], DOB:  March 02, 1951, LOS: 43 ADMISSION DATE:  08/25/2019, CONSULTATION DATE:  08/27/2019 REFERRING MD:  Dr. 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  Admitted by Neuro IR on 6/14 for cerebral angiogram s/p RT ICA cervical/ petrous junction balloon angioplasty for severe stenosis via right femoral approach.  Was placed on ASA/ brillinta.  Monitored in Neuro ICU.  Noted to have developed hypotension, increased left sided weakness and lethargy on 6/15 am with Hgb drop from 13.3 to 6.9 with new AKI.  Post MRI/ MRA showed scattered small acute infarcts along the right MCA/ watershed territory suspected to be resultant either post procedure vs hypoperfusion.    A CT abd/ pelvis was obtained given concern for retroperitoneal bleed which showed a mild to moderate amount of retroperitoneal hematoma in the right iliac and inguinal regiones with moderate size hematoma noted the the soft tissues anterior to the musculature of the right hip; no definite intrapertioneal hemorrhage. INR, PT, and platelets rechecked and were wnl.  Vascular surgery was consulted with plans to observe given bleeding was mostly into the muscle and tissue.  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   7/4 - TRACHEOSTOMY  7/11 - pall  care meeting - full code. Patient feels less congested, ventilatory setting was adjusted to see if she can be liberated from ventilator, avoiding tissue hypoxemia  7/12 - afebrile since 7/8. + 22L since admit. RN feels patient volume overloaded.Does SBT dailly of varying duration few to several hours and then gets fatigued with tachypnea and tachycardia and agitation. Left sided dense hemiplegia continues. Periodically pruposeful on right side PRecedex continues but unsure if patient needs it/ Diarrhea + - on docusate  7/13 - On PSV via trach. Still unable to come off precedex gtt. ? Diarrhea btter after stopping docusate. No labs as part of blood conservation. Got lasix yesterday. Last dose zosyn today. Diarrhea + but improved after dc laxative. Needed prn for high BP last night. Trach sutures removed   Consults:  NIR - primary Neurology (stroke has signed off 6/24) Vascular surgery  PCCM Palliative Care Hematology  Procedures:   ETT 6/19 > 6/23,  6/23 > Trach 7/4  Significant Diagnostic Tests:   6/14 cerebral angiogram s/p RT ICA cer/petrous junction balloon angioplasty for severe stenosis.   6/15 MRA/ MRI brain  > Scattered small acute infarcts along the right MCA/watershed territory. Solitary small acute infarct in the left parietal cortex and right cerebellum. Improved right ICA patency at the petrous segment. Known severe right M1 segment stenosis. 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  6/15 CT a/p wo contrast > Mild to moderate amount of retroperitoneal hematoma is noted in the right iliac and inguinal regions. Moderate size hematoma is noted in the soft tissues anterior to the musculature of the right hip. No definite intraperitoneal hemorrhage is noted. Moderate size fat  containing periumbilical hernia. Aortic Atherosclerosis.   Echo 6/16 > normal LVEF  6/29 MRI brain > Progressed size and confluence of ischemia throughout much of the right hemisphere white  matter since 08/26/2019. Associated petechial blood products but no malignant hemorrhagic transformation or mass Effect. There are also multiple new lacunar type infarcts elsewhere, including the brainstem and the left basal ganglia. Underlying chronic infarcts of the right basal ganglia and left Cerebellum.   Micro Data:  6/10 SARS 2 >> neg 6/14 MRSA PCR >> neg 6/20 trach asp >> few diptheroids/ corynebacterium species 6/18 BC x 2 >> neg 7/6 Morganella  Antimicrobials:  6/14 cefazolin pre-op  6/20 unasyn >> 09/02/2019 7/6 vancomycin > 7/8 7/6 Zosyn>> 7/12 (Morganella)  Interim history/subjective:  Claims she is breathing easier today and is coughing less. Less secretions on exam. Looks much better today than yesterday.   Objective   Blood pressure (!) 147/72, pulse 102, temperature 98.4 F (36.9 C), temperature source Axillary, resp. rate 18, height 5\' 1"  (1.549 m), weight 78.5 kg, SpO2 100 %.    FiO2 (%):  [28 %-35 %] 28 %   Intake/Output Summary (Last 24 hours) at 10/07/2019 1442 Last data filed at 10/07/2019 0810 Gross per 24 hour  Intake 8345.68 ml  Output 850 ml  Net 7495.68 ml   Filed Weights   10/03/19 0259 10/06/19 0400 10/07/19 0300  Weight: 85.1 kg 78.9 kg 78.5 kg    Examination: General: obese chronically ill appearing female in NAD HEENT: [place]/AT, PERRL, Trach in place 6 shiley cuffless CDI.  Neuro: Awake, alert, interactive.  CV: RRR, no MRG PULM: Clear throughout. Not in any distress.  GI: soft, bowel sounds active in all 4 quadrants, non-tender, non-distended,  Extremities: No acute deformity.  Skin: grossly intact  Resolved Hospital Problem list   Hypotension  AKI Sepsis VAP - Morganella s/p zosyn ending 09/23/19  Assessment & Plan:    Acute on chronic respiratory failure with hypoxia requiring mechanical ventilation.   -Failed trial of extubation due to upper airway stridor. Cords visibly scarred on reintubation.  -S/P trach 7/4 -Hypoxia on  7/26 felt to be secondary to secretions. CTA demonstrates some apical GGO possibly representing atypical infection. She has improved with pulmonary hygiene alone. No PE.  P: Remains on ATC FiO2 back down to 28% and tolerating well.  Hold off on ABX Tracheal suctioning as indicated Start Duonebs Chest PT Routine trach care Mobilize as able  Right MCA CVA due to bilateral intracranial stenoses  -s/p RT ICA cer/petrous junction balloon angioplasty for severe stenosis c/b right thigh and retroperitoneal hematoma.  Acute encephalopathy -Continues to slowly show improvement P: Per primary Ongoing PT/OT  PCCM will continue to follow weekly for trach care.    Best practice  Diet: Tube feeds DVT prophylaxis: Lovenox GI prophylaxis: Protonix Mobility: PT/OT CODE STATUS: Full code Disposition: Floor Family updated: Per primary  LABS    PULMONARY Recent Labs  Lab 10/06/19 1149  PHART 7.428  PCO2ART 45.8  PO2ART 112*  HCO3 29.5*  O2SAT 98.6    CBC Recent Labs  Lab 10/07/19 0617  HGB 6.6*  HCT 23.5*  WBC 7.0  PLT 262    COAGULATION No results for input(s): INR in the last 168 hours.  CARDIAC  No results for input(s): TROPONINI in the last 168 hours. No results for input(s): PROBNP in the last 168 hours.   CHEMISTRY Recent Labs  Lab 10/04/19 0500 10/06/19 1155  NA 140 143  K 4.0 3.8  CL 100 103  CO2 32 29  GLUCOSE 260* 223*  BUN 20 16  CREATININE 0.69 0.71  CALCIUM 9.0 9.2  MG 2.0  --   PHOS  --  3.4   Estimated Creatinine Clearance: 63.9 mL/min (by C-G formula based on SCr of 0.71 mg/dL).   LIVER Recent Labs  Lab 10/06/19 1155  ALBUMIN 2.5*     INFECTIOUS No results for input(s): LATICACIDVEN, PROCALCITON in the last 168 hours.   ENDOCRINE CBG (last 3)  Recent Labs    10/07/19 0306 10/07/19 0727 10/07/19 1142  GLUCAP 198* 183* 111*    IMAGING x48h  - image(s) personally visualized  -   highlighted in bold CT ANGIO CHEST PE W  OR WO CONTRAST  Result Date: 10/06/2019 CLINICAL DATA:  Respiratory distress. Evaluate for pulmonary embolism. Patient nonverbal. EXAM: CT ANGIOGRAPHY CHEST WITH CONTRAST TECHNIQUE: Multidetector CT imaging of the chest was performed using the standard protocol during bolus administration of intravenous contrast. Multiplanar CT image reconstructions and MIPs were obtained to evaluate the vascular anatomy. CONTRAST:  20mL OMNIPAQUE IOHEXOL 350 MG/ML SOLN COMPARISON:  Chest radiograph 10/06/2019.  Cardiac CT of 04/16/2019. FINDINGS: Cardiovascular: The quality of this exam for evaluation of pulmonary embolism is moderate. Limitations include mild motion, suboptimal bolus timing, patient arm position (not raised above the head). No pulmonary embolism to the lobar level. Right PICC line tip at low SVC. Aortic atherosclerosis. Bovine arch. Mild cardiomegaly and left ventricular hypertrophy. Lad coronary artery calcification. Mediastinum/Nodes: Enlarged, heterogeneous thyroid, likely due to multinodular goiter. No dominant mass. Endotracheal tube terminates well above the carina. Precarinal node measures 1.3 cm on 39/6. Prominent nodal tissue along the peribronchovascular interstitium in both infrahilar regions, likely reactive. Nasogastric tube is incompletely imaged but followed to the level of the stomach. Tiny hiatal hernia. Lungs/Pleura: Tiny right pleural effusion. Bilateral upper lobe predominant areas of ground-glass and nodular pulmonary opacities. Some areas of mild septal thickening. No well-defined lobar consolidation. No dominant lung mass. Example areas of nodularity including in the left upper lobe at 9 mm on 31/7 and the left lower lobe at 6 mm on 53/7. Upper Abdomen: Normal imaged portions of the liver, spleen, pancreas, gallbladder, right adrenal gland, kidneys. Left adrenal thickening. Musculoskeletal: Right hemidiaphragm elevation. No acute osseous abnormality. Review of the MIP images confirms the  above findings. IMPRESSION: 1. Moderate quality evaluation for pulmonary embolism. No pulmonary embolism with limitations above. 2. Bilateral upper lobe predominant areas of ground-glass and nodular pulmonary opacities. Favor atypical infection. 3. Tiny right pleural effusion. 4. Coronary artery atherosclerosis. Mild thoracic adenopathy, favored to be reactive. 5. Tiny hiatal hernia. 6. Aortic Atherosclerosis (ICD10-I70.0). Electronically Signed   By: Jeronimo Greaves M.D.   On: 10/06/2019 14:42   DG Chest Port 1 View  Result Date: 10/06/2019 CLINICAL DATA:  Shortness of breath EXAM: PORTABLE CHEST 1 VIEW COMPARISON:  10/05/2019 FINDINGS: Stable positioning of tracheostomy tube. Right-sided PICC line in terminating at the distal SVC. Enteric tube courses below the diaphragm, distal tip extending beyond the inferior margin of the film. Stable cardiomediastinal contours. Atherosclerotic calcification of the aortic knob. No new focal airspace consolidation. No pleural effusion or pneumothorax. IMPRESSION: No acute cardiopulmonary findings. Stable lines and tubes, as above. Electronically Signed   By: Duanne Guess D.O.   On: 10/06/2019 11:08   DG Chest Port 1 View  Result Date: 10/05/2019 CLINICAL DATA:  Acute respiratory distress EXAM: PORTABLE CHEST 1 VIEW COMPARISON:  10/02/2019 FINDINGS: Tracheostomy, right upper extremity PICC  line, and nasoenteric feeding tube extending into the upper abdomen beyond the margin of the examination are unchanged. Lungs are clear. No pneumothorax or pleural effusion. Cardiac size within normal limits. Pulmonary vascularity is normal. IMPRESSION: Stable examination. No focal pulmonary infiltrate identified. Preserved pulmonary insufflation. Electronically Signed   By: Helyn NumbersAshesh  Parikh MD   On: 10/05/2019 22:16     Signature:    Joneen RoachPaul Arshi Duarte, AGACNP-BC [facility]  See Amion for personal pager PCCM on call pager [phone]  10/07/2019 2:42  PM

## 2019-10-07 NOTE — Progress Notes (Signed)
Referring Physician(s): Melvenia Beam  Supervising Physician: Luanne Bras  Patient Status:  [facility] - In-pt  Chief Complaint: None-tracheostomy without sedation  Subjective:  Right ICA cervical/petrous junction stenosis s/p revascularization using balloon angioplasty via right radial and right femoral approach6/14/2021by Dr. Estanislado Pandy. Mild-moderate right thigh (with some retroperitoneal component) hematoma. Acute blood loss anemia, Jehovah's Witness. Patientawakelaying in bed, tracheostomy in place,no sedation.Shefollowssimple commandsand isalert today. Nods head appropriately to simple yes/no questions. Follows commands with right side, demonstrates neglectofleft side. Right groin and right radial puncture sites c/d/i. Most recenthgbwas6.6 this AM (grossly stable from most recent hgb 6.8 on 09/26/2019). No family at bedside this AM.   Allergies: Patient has no known allergies.  Medications: Prior to Admission medications   Medication Sig Start Date End Date Taking? Authorizing Provider  amLODipine (NORVASC) 10 MG tablet Take 1 tablet (10 mg total) by mouth daily. 05/12/19  Yes Minette Brine, FNP  aspirin EC 81 MG tablet Take 81 mg by mouth every evening.   Yes [provider]  Calcium Carbonate-Vitamin D (CALCIUM-D PO) Take 2 tablets by mouth daily at 12 noon.    Yes [provider]  carvedilol (COREG) 6.25 MG tablet TAKE 1 TABLET(6.25 MG) BY MOUTH TWICE DAILY Patient taking differently: Take 6.25 mg by mouth 2 (two) times daily with a meal.  07/31/19  Yes Minette Brine, FNP  hydrALAZINE (APRESOLINE) 50 MG tablet Take 1 tablet (50 mg total) by mouth 2 (two) times daily. 06/16/19 09/14/19 Yes Elouise Munroe, MD  Insulin Degludec-Liraglutide (XULTOPHY) 100-3.6 UNIT-MG/ML SOPN Inject 30 Units into the skin daily. 08/14/19  Yes Minette Brine, FNP  telmisartan-hydrochlorothiazide (MICARDIS HCT) 40-12.5 MG tablet Take 1 tablet by mouth daily.  07/31/19  Yes Minette Brine, FNP  ticagrelor (BRILINTA) 90 MG TABS tablet Take 1 tablet (90 mg total) by mouth 2 (two) times daily. 06/12/19  Yes Elouise Munroe, MD  vitamin C (ASCORBIC ACID) 250 MG tablet Take 500 mg by mouth daily at 12 noon.   Yes [provider]  blood glucose meter kit and supplies KIT Dispense based on patient and insurance preference. Use up to four times daily as directed. (FOR ICD-9 250.00, 250.01). 05/10/18   Minette Brine, FNP  Blood Glucose Monitoring Suppl (TRUE METRIX METER) w/Device KIT 1 each by Does not apply route in the morning, at noon, in the evening, and at bedtime. 05/22/19   Minette Brine, FNP  Evolocumab (REPATHA SURECLICK) 505 MG/ML SOAJ Inject 140 mg into the skin every 14 (fourteen) days. 06/30/19   Elouise Munroe, MD  glucose blood (TRUE METRIX BLOOD GLUCOSE TEST) test strip Check blood sugar 4 times a day before meals and bedtime 05/22/19   Minette Brine, FNP  Insulin Glargine-Lixisenatide (SOLIQUA) 100-33 UNT-MCG/ML SOPN Inject 25 Units into the skin daily. Patient taking differently: Inject 30 Units into the skin daily.  07/14/19   Minette Brine, FNP  Multiple Vitamin (MULTIVITAMIN PO) Take 1 tablet by mouth daily at 12 noon.     [provider]  Omega-3 Fatty Acids (OMEGA-3 PLUS PO) Take 2 capsules by mouth daily at 12 noon.    [provider]     Vital Signs: BP (!) 139/58 (BP Location: Left Arm)   Pulse (P) 102   Temp 98.4 F (36.9 C) (Axillary)   Resp (P) 15   Ht _0  (1.549 m)   Wt 173 lb 1 oz (78.5 kg)   SpO2 (P) 100%   BMI 32.70 kg/m  Physical Exam Vitals and nursing note reviewed.  Constitutional:      General: She is not in acute distress.    Appearance: Normal appearance.  Pulmonary:     Effort: Pulmonary effort is normal. No respiratory distress.     Comments: Tracheostomy without sedation. Skin:    General: Skin is warm and dry.     Comments: Gross anasarca. Right groin puncture site soft  without active bleeding or hematoma. Right radial puncture site soft without active bleeding or hematoma.   Neurological:     Mental Status: She is alert.     Comments: Alert today, nods to yes/no questions appropriately, follows simple commands of right side. PERRL bilaterally. Follows commands with right side (wiggles right toes, grips with right hand and lifts LUE over head), demonstrates neglect of left side. Distal pulses (DPs) 1+ bilaterally and (radial) 2+ bilaterally.         Imaging: CT ANGIO CHEST PE W OR WO CONTRAST  Result Date: 10/06/2019 CLINICAL DATA:  Respiratory distress. Evaluate for pulmonary embolism. Patient nonverbal. EXAM: CT ANGIOGRAPHY CHEST WITH CONTRAST TECHNIQUE: Multidetector CT imaging of the chest was performed using the standard protocol during bolus administration of intravenous contrast. Multiplanar CT image reconstructions and MIPs were obtained to evaluate the vascular anatomy. CONTRAST:  67m OMNIPAQUE IOHEXOL 350 MG/ML SOLN COMPARISON:  Chest radiograph 10/06/2019.  Cardiac CT of 04/16/2019. FINDINGS: Cardiovascular: The quality of this exam for evaluation of pulmonary embolism is moderate. Limitations include mild motion, suboptimal bolus timing, patient arm position (not raised above the head). No pulmonary embolism to the lobar level. Right PICC line tip at low SVC. Aortic atherosclerosis. Bovine arch. Mild cardiomegaly and left ventricular hypertrophy. Lad coronary artery calcification. Mediastinum/Nodes: Enlarged, heterogeneous thyroid, likely due to multinodular goiter. No dominant mass. Endotracheal tube terminates well above the carina. Precarinal node measures 1.3 cm on 39/6. Prominent nodal tissue along the peribronchovascular interstitium in both infrahilar regions, likely reactive. Nasogastric tube is incompletely imaged but followed to the level of the stomach. Tiny hiatal hernia. Lungs/Pleura: Tiny right pleural effusion. Bilateral upper lobe  predominant areas of ground-glass and nodular pulmonary opacities. Some areas of mild septal thickening. No well-defined lobar consolidation. No dominant lung mass. Example areas of nodularity including in the left upper lobe at 9 mm on 31/7 and the left lower lobe at 6 mm on 53/7. Upper Abdomen: Normal imaged portions of the liver, spleen, pancreas, gallbladder, right adrenal gland, kidneys. Left adrenal thickening. Musculoskeletal: Right hemidiaphragm elevation. No acute osseous abnormality. Review of the MIP images confirms the above findings. IMPRESSION: 1. Moderate quality evaluation for pulmonary embolism. No pulmonary embolism with limitations above. 2. Bilateral upper lobe predominant areas of ground-glass and nodular pulmonary opacities. Favor atypical infection. 3. Tiny right pleural effusion. 4. Coronary artery atherosclerosis. Mild thoracic adenopathy, favored to be reactive. 5. Tiny hiatal hernia. 6. Aortic Atherosclerosis (ICD10-I70.0). Electronically Signed   By: KAbigail MiyamotoM.D.   On: 10/06/2019 14:42   DG Chest Port 1 View  Result Date: 10/06/2019 CLINICAL DATA:  Shortness of breath EXAM: PORTABLE CHEST 1 VIEW COMPARISON:  10/05/2019 FINDINGS: Stable positioning of tracheostomy tube. Right-sided PICC line in terminating at the distal SVC. Enteric tube courses below the diaphragm, distal tip extending beyond the inferior margin of the film. Stable cardiomediastinal contours. Atherosclerotic calcification of the aortic knob. No new focal airspace consolidation. No pleural effusion or pneumothorax. IMPRESSION: No acute cardiopulmonary findings. Stable lines and tubes, as above. Electronically Signed   By: NHart Carwin  Plundo D.O.   On: 10/06/2019 11:08   DG Chest Port 1 View  Result Date: 10/05/2019 CLINICAL DATA:  Acute respiratory distress EXAM: PORTABLE CHEST 1 VIEW COMPARISON:  10/02/2019 FINDINGS: Tracheostomy, right upper extremity PICC line, and nasoenteric feeding tube extending into the  upper abdomen beyond the margin of the examination are unchanged. Lungs are clear. No pneumothorax or pleural effusion. Cardiac size within normal limits. Pulmonary vascularity is normal. IMPRESSION: Stable examination. No focal pulmonary infiltrate identified. Preserved pulmonary insufflation. Electronically Signed   By: Fidela Salisbury MD   On: 10/05/2019 22:16    Labs:  CBC: Recent Labs    09/18/19 0923 09/20/19 0500 09/26/19 0257 10/07/19 0617  WBC 15.3* 10.4 11.3* 7.0  HGB 4.7* 4.7* 6.8* 6.6*  HCT 16.4* 17.5* 22.5* 23.5*  PLT 435* 447* 247 262    COAGS: Recent Labs    11/27/18 0728 08/18/19 0705 08/26/19 1715 08/27/19 0230  INR 0.9 0.9 1.1 1.2    BMP: Recent Labs    09/21/19 0500 09/26/19 0537 10/04/19 0500 10/06/19 1155  NA 142 141 140 143  K 3.3* 3.8 4.0 3.8  CL 103 106 100 103  CO2 30 30 32 29  GLUCOSE 145* 141* 260* 223*  BUN 41* _0 CALCIUM 8.6* 8.7* 9.0 9.2  CREATININE 0.81 0.58 0.69 0.71  GFRNONAA >60 >60 >60 >60  GFRAA >60 >60 >60 >60    LIVER FUNCTION TESTS: Recent Labs    09/17/19 0703 09/17/19 0703 09/20/19 0500 09/21/19 0500 09/26/19 0537 10/06/19 1155  BILITOT 1.1  --  0.7 0.3 0.5  --   AST 32  --  _1 --   ALT 23  --  29 36 42  --   ALKPHOS 82  --  95 81 57  --   PROT 6.6  --  7.4 7.1 5.5*  --   ALBUMIN 1.6*   < > 1.5* 1.6* 1.8* 2.5*   < > = values in this interval not displayed.    Assessment and Plan:  Right ICA cervical/petrous junction stenosis s/p revascularization using balloon angioplasty via right radial and right femoral approach6/14/2021by Dr. Estanislado Pandy. Mild-moderate right thigh (with some retroperitoneal component) hematoma. Acute blood loss anemia, Jehovah's Witness. Patient's conditionstable-tracheostomy withoutsedation, alert today, follows simple commands of right side, demonstrates neglectofleft side. Right groinand right radial puncture sitesstable, no signs of active bleeding. Hgbgrossly  stable at 6.6 this AM-receiving supplementation per severe anemia protocol. For possible transfer to SNF (insurance denied LTAC),awaiting family decision regarding SNF placement. Continue to hold Brilinta/Aspirin/Lovenox.Agree with CCM plans toavoid excess blood drawl. Further plans perTRH/neurology/palliative care- appreciate and agree with management. NIR to follow.   Electronically Signed: Earley Abide, PA-C 10/07/2019, 11:24 AM   I spent a total of 15 Minutes at the the patient's bedside AND on the patient's hospital floor or unit, greater than 50% of which was counseling/coordinating care for right ICA stenosis s/p revascularization.

## 2019-10-07 NOTE — Evaluation (Addendum)
Physical Therapy Treatment Patient Details Name: Donna [name] MRN: [id number] DOB: 20-Aug-1950 Today's Date: 10/07/2019   History of Present Illness  69 y.o. female with PMH significant for HTN, HLD, CVA 06/2018, DM, anemia, obesity, OSA, and OA. She is known to [facility] and has been followed by Dr. Corliss Skains since 10/2018. Pt underwent R ICA angioplasty on 6/14. Pt with decline in neuro status and hemoglobin on 6/15, found to have a large R groin hematoma.  Pt has had Hbg 5.0 or less and is a Jehovah's witness.  She was intubated on 6/19. Pt underwent tracheostomy on 7/3. PT found to be septic with RLL PNA on 7/6. 7/13 trach tolerating extubation. Patient had a significant event on 7/25 resulting in respiratory distress, oxygen desaturation, and retroperitoneal hematomas in the right iliac/inguinal regions.    Clinical Impression  Pt was received laying in bed awake and alert. Nodded in agreement to participate in physical therapy session. Patient completed the following therapeutic exercises laying supine in bed with HOB elevated: quad sets x 5, dorsiflexion, plantarflexion, heel slides, hip abduction, hip adduction, and shoulder flexion. RUE and RLE completed exercises within available ROM. LUE and LLE required verbal and tactile cueing to activate musculature. 1/5 MMT globally on L side. Patient was transferred supine <> sit for 2 minutes with MaxA x 2. Rolling to L side x 5. Patient was repositioned comfortably in bed with a pillow supporting her LUE, all lines attached, and all needs within reach.    Follow Up Recommendations SNF    Equipment Recommendations  Wheelchair (measurements PT);Wheelchair cushion (measurements PT);Hospital bed    Recommendations for Other Services       Precautions / Restrictions Precautions Precautions: Fall Precaution Comments: trach, low hbg Required Braces or Orthoses: Other Brace Other Brace: PRAFO LLE, abdominal binder, heel floats Restrictions Weight  Bearing Restrictions: No      Mobility  Bed Mobility Overal bed mobility: Needs Assistance Bed Mobility: Supine to Sit;Sit to Supine;Rolling Rolling: +2 for physical assistance;Max assist   Supine to sit: Max assist;+2 for physical assistance Sit to supine: Max assist;+2 for physical assistance   General bed mobility comments: MaxAx2 for bed mobility. Required verbal and tactile cueing. Able to initiate movements, but needed assistance to complete bed mobilty.  Transfers   Equipment used: None             General transfer comment: deferred- fatigue  Ambulation/Gait             General Gait Details: deferred- fatigue  Stairs            Wheelchair Mobility    Modified Rankin (Stroke Patients Only)       Balance Overall balance assessment: Needs assistance Sitting-balance support: Single extremity supported;Feet supported Sitting balance-Leahy Scale: Poor Sitting balance - Comments: Min-ModAx1 to maintain midline sitting. Pushes left of midline with RUE. Posterior lean Postural control: Posterior lean;Left lateral lean     Standing balance comment: deferred                             Pertinent Vitals/Pain Pain Assessment: No/denies pain Pain Score: 0-No pain Faces Pain Scale: No hurt Pain Location: mild grimacing with movement Pain Descriptors / Indicators: Grimacing Pain Intervention(s): Monitored during session;Limited activity within patient's tolerance    Home Living Family/patient expects to be discharged to:: Skilled nursing facility Living Arrangements: Children;Other relatives Available Help at Discharge: Family Type of Home: House Home Access:  Level entry     Home Layout: One level   Additional Comments: no family present, obtained from chart review of earlier OT eval this admission    Prior Function Level of Independence: Independent         Comments: per OT eval earlier this admission     Hand Dominance    Dominant Hand: Right    Extremity/Trunk Assessment   Upper Extremity Assessment Upper Extremity Assessment: Defer to OT evaluation;Generalized weakness;LUE deficits/detail RUE Deficits / Details: grossly 3- / 5 shoulder flexion activation for 20 degrees PROM WLF LUE Deficits / Details: flaccid, PROM WFL, edematous. Starting to be able to contract muscles of LUE LUE Sensation: decreased light touch LUE Coordination: decreased fine motor;decreased gross motor    Lower Extremity Assessment Lower Extremity Assessment: Generalized weakness;LLE deficits/detail RLE Deficits / Details: decreased strength and coordination LLE Deficits / Details: flaccid, PROM WFL although stiff initially. Starting to be able to contract muscles on LLE LLE Coordination: decreased gross motor    Cervical / Trunk Assessment Cervical / Trunk Assessment: Other exceptions Cervical / Trunk Exceptions: anterior head position with rounded shoulders  Communication   Communication: Tracheostomy  Cognition Arousal/Alertness: Awake/alert Behavior During Therapy: WFL for tasks assessed/performed Overall Cognitive Status: Difficult to assess Area of Impairment: Attention;Following commands;Problem solving                   Current Attention Level: Sustained   Following Commands: Follows one step commands with increased time Safety/Judgement: Decreased awareness of safety;Decreased awareness of deficits Awareness: Intellectual Problem Solving: Slow processing;Requires verbal cues;Decreased initiation;Difficulty sequencing;Requires tactile cues General Comments: patient was able to follow commands, required verbal and tactile cues, able to contact musculature on L side      General Comments      Exercises     Assessment/Plan    PT Assessment Patient needs continued PT services  PT Problem List Decreased strength;Decreased range of motion;Decreased activity tolerance;Decreased balance;Decreased  mobility;Decreased coordination;Decreased cognition;Decreased knowledge of use of DME;Decreased safety awareness;Decreased knowledge of precautions;Cardiopulmonary status limiting activity       PT Treatment Interventions DME instruction;Gait training;Functional mobility training;Therapeutic activities;Therapeutic exercise;Balance training;Neuromuscular re-education;Cognitive remediation;Patient/family education;Wheelchair mobility training    PT Goals (Current goals can be found in the Care Plan section)  Acute Rehab PT Goals Patient Stated Goal: none stated PT Goal Formulation: Patient unable to participate in goal setting Potential to Achieve Goals: Fair    Frequency Min 3X/week   Barriers to discharge        Co-evaluation               AM-PAC PT "6 Clicks" Mobility  Outcome Measure Help needed turning from your back to your side while in a flat bed without using bedrails?: Total Help needed moving from lying on your back to sitting on the side of a flat bed without using bedrails?: Total Help needed moving to and from a bed to a chair (including a wheelchair)?: Total Help needed standing up from a chair using your arms (e.g., wheelchair or bedside chair)?: Total Help needed to walk in hospital room?: Total Help needed climbing 3-5 steps with a railing? : Total 6 Click Score: 6    End of Session   Activity Tolerance: Patient tolerated treatment well Patient left: in bed;with SCD's reapplied;with call bell/phone within reach;with bed alarm set   PT Visit Diagnosis: Other abnormalities of gait and mobility (R26.89);Muscle weakness (generalized) (M62.81);Other symptoms and signs involving the nervous system (R29.898);Hemiplegia and hemiparesis  Hemiplegia - Right/Left: Left Hemiplegia - dominant/non-dominant: Non-dominant Hemiplegia - caused by: Cerebral infarction    Time: 2751-7001 PT Time Calculation (min) (ACUTE ONLY): 30 min   Charges:     PT  Treatments $Therapeutic Exercise: 8-22 mins $Therapeutic Activity: 8-22 mins        Elisha Ponder, SPT, ATC

## 2019-10-07 NOTE — TOC Progression Note (Signed)
Transition of Care [facility]) - Progression Note    Patient Details  Name: Donna [name] MRN: [id number] Date of Birth: 08/01/1950  Transition of Care [facility]) CM/SW Contact  Donna Sabal, RN Phone Number: 10/07/2019, 3:45 PM  Clinical Narrative:    Donna [name] w patient's son Donna [name] over the phone.  Discussed disposition plan. We discussed options, reviewed level of support for home services and Donna [name] agreed that SNF would be most appropriate for level of care needed after DC. Donna [name] stated that he needed time to consider with the family SNF options. He stated this to CSW on 7/23 as well.  He would be agreeable to placement in [facility]. Will submit PASRR information, complete FL2, and fax out. Informed Donna [name] that I would be calling him back tomorrow to discuss further, that we would need to start authorization process several days prior to discharge and we were nearing this time. 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 I asked for a specific time that he would be available to speak tomorrow, and he could not give one.     Expected Discharge Plan: Skilled Nursing Facility Barriers to Discharge: Continued Medical Work up  Expected Discharge Plan and Services Expected Discharge Plan: Skilled Nursing Facility                                               Social Determinants of Health (SDOH) Interventions    Readmission Risk Interventions No flowsheet data found.

## 2019-10-07 NOTE — Progress Notes (Signed)
PROGRESS NOTE  Donna [name] NPY:[id number] DOB: Jun 16, 1950   PCP: Minette Brine, FNP  Patient is from: Home  DOA: 08/25/2019 LOS: 65  Brief Narrative / Interim history: 69 y.o.femaleJehovah witness, with prior history of HTN, HLD, CVA (06/2018- right MCA territory stroke with residual mild left hemiparesis), multiple intracranial stenosis including anterior/ posterior circulation, DM, anemia, obesity, OSA, and arthritis.  Admitted by Neuro IR on 6/14 for cerebral angiogram s/p RT ICA cervical/ petrous junction balloon angioplasty for severe stenosis via right femoral approach. Was placed on ASA/ brillinta. Monitored in Neuro ICU. Noted to have developed hypotension, increased left sided weakness and lethargy on 6/15 am with Hgb drop from 13.3 to 6.9 with new AKI. Post MRI/ MRA showed scattered small acute infarcts along the right MCA/ watershed territory suspected to be resultant either post procedure vs hypoperfusion. Continues to have left-sided neglect.  A CT abd/ pelvis was obtained given concern for retroperitoneal bleed which showed a mild to moderate amount of retroperitoneal hematoma in the right iliac and inguinal regiones with moderate size hematoma noted the the soft tissues anterior to the musculature of the right hip; no definite intrapertioneal hemorrhage. INR, PT, and platelets rechecked and were wnl. Vascular surgery was consulted with plans to observe given bleeding was mostly into the muscle and tissue.   Patient developed respiratory distress with significant desaturation the night of 7/25-7/26.  CXR without acute finding. Tube feed on held given concern for aspiration.  Trach aspiration performed.  Received high-dose Solu-Medrol and Lasix.  PCCM consulted.  CTA chest negative for PE but GGO and nodular opacities in bilateral upper lobes.   Subjective: Seen and examined earlier this morning.  No major events overnight of this morning.  No complaints.  She notes no  to pain.  Appears to be comfortable.  No respiratory distress.  Objective: Vitals:   10/07/19 0809 10/07/19 1100 10/07/19 1114 10/07/19 1141  BP: (!) 139/58   (!) 147/72  Pulse:   102   Resp: _0 Temp: 98.4 F (36.9 C)     TempSrc: Axillary     SpO2: 100% 100% 100% 100%  Weight:      Height:        Intake/Output Summary (Last 24 hours) at 10/07/2019 1236 Last data filed at 10/07/2019 0810 Gross per 24 hour  Intake 8345.68 ml  Output 850 ml  Net 7495.68 ml   Filed Weights   10/03/19 0259 10/06/19 0400 10/07/19 0300  Weight: 85.1 kg 78.9 kg 78.5 kg    Examination:  GENERAL: No apparent distress.  Nontoxic. HEENT: MMM.  Vision and hearing grossly intact.  NECK: On trach collar. RESP: 100% on 8 L / 35%.  No IWOB.  Fair aeration bilaterally. CVS:  RRR. Heart sounds normal.  ABD/GI/GU: BS+. Abd soft, NTND.  MSK/EXT:  Moves extremities. No apparent deformity.  Trace edema in LUE. SKIN: no apparent skin lesion or wound NEURO: Awake and follows commands.  Left hemiparesis with more prominence in LLE PSYCH: Calm. Normal affect.  Procedures:  Right ICA junction balloon angioplasty Tracheostomy  Microbiology summarized: MRSA PCR negative. 6/18-blood cultures negative. 6/20-respiratory culture with few diphtheroids 7/4-respiratory culture with Morganella morganii 7/6-blood culture negative  Assessment & Plan: Acute on chronic respiratory failure with hypoxia: Failed trial of extubation due to upper airway stridor. Cords visibly scarred on reintubation.  Tracheostomy on 7/4> trach collar. CXR without significant finding but CTA with bilateral upper lobe opacities.  Respiratory distress improved.  -IV  Solu-Medrol 125 mg once on 7/26. -Pulmicort and DuoNeb -Low threshold for antibiotics to cover for aspiration pneumonia -Appreciate PCCM input. -Resume tube feeds at slow rate. 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  -No anticoagulation or antiplatelet given bleed as above -On tube feed via core track.  May need PEG tube going forward unless oral intake improves. -Final disposition likely SNF if stable -Neurology signed off.  Acutemetabolicencephalopathy: Improved.  Responds to questions by nodding and follows command. - cotninue seroquel at 129m QHS (has helped come off precedex) -monitor QTc intermittently.  Dysphagia: Likely due to CVA. -I think she need PEG tube but have SLP reevaluate again.  I have discussed this with patient's son, KEzekiel Slocumb-Aspiration precaution -Tube feed via Cortrak as above -Discontinue D5-1/2NS when tube feed is at goal.  Volume Overload with hypertension: Appears euvolemic on exam.  Intermittently received IV Lasix. - Goal to keep SBP<160 given intracranial stenosis.  Acute blood loss anemia ->Anemia of critical illness: Jehovah's Witness, no blood products including albumin. Hgb 6.6 (relatively stable).  Continue severe anemia protocol: Iron, Folic acid, vitamin BZ36 Monitor off Epogen and Vit C that was previously given -Intermittently monitor H&H -Avoid blood draws if possible  Diarrhea-resolved -most likely due to tube feedings -Continue to monitor  Uncontrolled diabetes with hyperglycemia: A1c 5.2%. Recent Labs  Lab 10/06/19 1953 10/06/19 2250 10/07/19 0306 10/07/19 0727 10/07/19 1142  GLUCAP 317* 277* 198* 183* 111*  -Continue Levemir 12 units -Continue SSI  Debility/physical deconditioning -Continue PT/OT when medically stable.  Class II obesity/inadequate oral intake Body mass index is 32.7 kg/m. Nutrition Problem: Inadequate oral intake Etiology: inability to eat Signs/Symptoms: NPO status Interventions: Magic cup, Tube feeding, Hormel Shake   DVT prophylaxis:  Place and maintain sequential  compression device Start: 09/26/19 1319  Code Status: Full code Family Communication: Updated patient's son, KEzekiel Slocumbover the phone Status is: Inpatient  Remains inpatient appropriate because: Respiratory distress requiring high level of oxygen via trach, reliable means of feeding either improved p.o. intake or PEG tube and improved HH   Dispo: The patient is from: Home              Anticipated d/c is to: SNF.  Insurance denied LTAC.              Anticipated d/c date is: > 3 days              Patient currently is not medically stable to d/c.    Consultants:  PCCM Neurology Interventional radiology   Sch Meds:  Scheduled Meds: . amLODipine  10 mg Per Tube Daily  . atorvastatin  40 mg Per NG tube Daily  . budesonide (PULMICORT) nebulizer solution  0.5 mg Nebulization BID  . chlorhexidine gluconate (MEDLINE KIT)  15 mL Mouth Rinse BID  . Chlorhexidine Gluconate Cloth  6 each Topical Daily  . famotidine  20 mg Per Tube BID  . feeding supplement (JEVITY 1.2 CAL)  900 mL Per Tube Q24H  . feeding supplement (PROSource TF)  45 mL Per Tube TID  . ferrous sulfate  220 mg Per Tube TID WC  . hydrALAZINE  100 mg Per Tube Q6H  . hydrocerin   Topical TID  . insulin aspart  0-15 Units Subcutaneous Q4H  . insulin aspart  4 Units Subcutaneous Q4H  . insulin glargine  12  Units Subcutaneous Daily  . ipratropium-albuterol  3 mL Nebulization Q4H  . mouth rinse  15 mL Mouth Rinse 10 times per day  . QUEtiapine  100 mg Per Tube QHS  . sodium chloride flush  10-40 mL Intracatheter Q12H   Continuous Infusions: . sodium chloride 10 mL/hr at 09/25/19 1800  . dextrose 5 % and 0.45% NaCl 50 mL/hr at 10/06/19 1216   PRN Meds:.acetaminophen **OR** acetaminophen (TYLENOL) oral liquid 160 mg/5 mL **OR** acetaminophen, albuterol, fentaNYL (SUBLIMAZE) injection, hydrALAZINE, labetalol, lip balm, oxyCODONE, sodium chloride flush  Antimicrobials: Anti-infectives (From admission, onward)   Start      Dose/Rate Route Frequency Ordered Stop   09/16/19 2130  vancomycin (VANCOCIN) IVPB 1000 mg/200 mL premix  Status:  Discontinued        1,000 mg 200 mL/hr over 60 Minutes Intravenous Every 12 hours 09/16/19 0839 09/18/19 0756   09/16/19 1630  piperacillin-tazobactam (ZOSYN) IVPB 3.375 g        3.375 g 12.5 mL/hr over 240 Minutes Intravenous Every 8 hours 09/16/19 0835 09/23/19 1153   09/16/19 1000  ceFEPIme (MAXIPIME) 1 g in sodium chloride 0.9 % 100 mL IVPB  Status:  Discontinued        1 g 200 mL/hr over 30 Minutes Intravenous Every 12 hours 09/16/19 0756 09/16/19 0803   09/16/19 0845  piperacillin-tazobactam (ZOSYN) IVPB 3.375 g        3.375 g 100 mL/hr over 30 Minutes Intravenous  Once 09/16/19 0834 09/16/19 1007   09/16/19 0845  vancomycin (VANCOREADY) IVPB 1500 mg/300 mL        1,500 mg 150 mL/hr over 120 Minutes Intravenous  Once 09/16/19 0839 09/16/19 1248   09/16/19 0830  piperacillin-tazobactam (ZOSYN) IVPB 3.375 g  Status:  Discontinued        3.375 g 12.5 mL/hr over 240 Minutes Intravenous Every 8 hours 09/16/19 0803 09/16/19 0834   09/16/19 0800  vancomycin (VANCOCIN) IVPB 1000 mg/200 mL premix  Status:  Discontinued        1,000 mg 200 mL/hr over 60 Minutes Intravenous Every 12 hours 09/16/19 0756 09/16/19 0839   08/31/19 1400  Ampicillin-Sulbactam (UNASYN) 3 g in sodium chloride 0.9 % 100 mL IVPB  Status:  Discontinued        3 g 200 mL/hr over 30 Minutes Intravenous Every 6 hours 08/31/19 1357 09/02/19 0819   08/25/19 0723  ceFAZolin (ANCEF) IVPB 2g/100 mL premix        2 g 200 mL/hr over 30 Minutes Intravenous 60 min pre-op 08/25/19 0723 08/25/19 0930       I have personally reviewed the following labs and images: CBC: Recent Labs  Lab 10/07/19 0617  WBC 7.0  HGB 6.6*  HCT 23.5*  MCV 101.7*  PLT 262   BMP &GFR Recent Labs  Lab 10/04/19 0500 10/06/19 1155  NA 140 143  K 4.0 3.8  CL 100 103  CO2 32 29  GLUCOSE 260* 223*  BUN 20 16  CREATININE 0.69  0.71  CALCIUM 9.0 9.2  MG 2.0  --   PHOS  --  3.4   Estimated Creatinine Clearance: 63.9 mL/min (by C-G formula based on SCr of 0.71 mg/dL). Liver & Pancreas: Recent Labs  Lab 10/06/19 1155  ALBUMIN 2.5*   No results for input(s): LIPASE, AMYLASE in the last 168 hours. No results for input(s): AMMONIA in the last 168 hours. Diabetic: No results for input(s): HGBA1C in the last 72 hours. Recent Labs  Lab 10/06/19  1953 10/06/19 2250 10/07/19 0306 10/07/19 0727 10/07/19 1142  GLUCAP 317* 277* 198* 183* 111*   Cardiac Enzymes: No results for input(s): CKTOTAL, CKMB, CKMBINDEX, TROPONINI in the last 168 hours. No results for input(s): PROBNP in the last 8760 hours. Coagulation Profile: No results for input(s): INR, PROTIME in the last 168 hours. Thyroid Function Tests: No results for input(s): TSH, T4TOTAL, FREET4, T3FREE, THYROIDAB in the last 72 hours. Lipid Profile: No results for input(s): CHOL, HDL, LDLCALC, TRIG, CHOLHDL, LDLDIRECT in the last 72 hours. Anemia Panel: No results for input(s): VITAMINB12, FOLATE, FERRITIN, TIBC, IRON, RETICCTPCT in the last 72 hours. Urine analysis:    Component Value Date/Time   COLORURINE AMBER (A) 09/16/2019 0931   APPEARANCEUR HAZY (A) 09/16/2019 0931   LABSPEC 1.021 09/16/2019 0931   PHURINE 5.0 09/16/2019 0931   GLUCOSEU NEGATIVE 09/16/2019 0931   HGBUR NEGATIVE 09/16/2019 0931   BILIRUBINUR NEGATIVE 09/16/2019 0931   BILIRUBINUR negative 01/02/2018 1636   KETONESUR NEGATIVE 09/16/2019 0931   PROTEINUR 30 (A) 09/16/2019 0931   UROBILINOGEN 0.2 01/02/2018 1636   NITRITE NEGATIVE 09/16/2019 0931   LEUKOCYTESUR SMALL (A) 09/16/2019 0931   Sepsis Labs: Invalid input(s): PROCALCITONIN, [place]  Microbiology: No results found for this or any previous visit (from the past 240 hour(s)).  Radiology Studies: CT ANGIO CHEST PE W OR WO CONTRAST  Result Date: 10/06/2019 CLINICAL DATA:  Respiratory distress. Evaluate for  pulmonary embolism. Patient nonverbal. EXAM: CT ANGIOGRAPHY CHEST WITH CONTRAST TECHNIQUE: Multidetector CT imaging of the chest was performed using the standard protocol during bolus administration of intravenous contrast. Multiplanar CT image reconstructions and MIPs were obtained to evaluate the vascular anatomy. CONTRAST:  31m OMNIPAQUE IOHEXOL 350 MG/ML SOLN COMPARISON:  Chest radiograph 10/06/2019.  Cardiac CT of 04/16/2019. FINDINGS: Cardiovascular: The quality of this exam for evaluation of pulmonary embolism is moderate. Limitations include mild motion, suboptimal bolus timing, patient arm position (not raised above the head). No pulmonary embolism to the lobar level. Right PICC line tip at low SVC. Aortic atherosclerosis. Bovine arch. Mild cardiomegaly and left ventricular hypertrophy. Lad coronary artery calcification. Mediastinum/Nodes: Enlarged, heterogeneous thyroid, likely due to multinodular goiter. No dominant mass. Endotracheal tube terminates well above the carina. Precarinal node measures 1.3 cm on 39/6. Prominent nodal tissue along the peribronchovascular interstitium in both infrahilar regions, likely reactive. Nasogastric tube is incompletely imaged but followed to the level of the stomach. Tiny hiatal hernia. Lungs/Pleura: Tiny right pleural effusion. Bilateral upper lobe predominant areas of ground-glass and nodular pulmonary opacities. Some areas of mild septal thickening. No well-defined lobar consolidation. No dominant lung mass. Example areas of nodularity including in the left upper lobe at 9 mm on 31/7 and the left lower lobe at 6 mm on 53/7. Upper Abdomen: Normal imaged portions of the liver, spleen, pancreas, gallbladder, right adrenal gland, kidneys. Left adrenal thickening. Musculoskeletal: Right hemidiaphragm elevation. No acute osseous abnormality. Review of the MIP images confirms the above findings. IMPRESSION: 1. Moderate quality evaluation for pulmonary embolism. No  pulmonary embolism with limitations above. 2. Bilateral upper lobe predominant areas of ground-glass and nodular pulmonary opacities. Favor atypical infection. 3. Tiny right pleural effusion. 4. Coronary artery atherosclerosis. Mild thoracic adenopathy, favored to be reactive. 5. Tiny hiatal hernia. 6. Aortic Atherosclerosis (ICD10-I70.0). Electronically Signed   By: KAbigail MiyamotoM.D.   On: 10/06/2019 14:42     Kilea Mccarey T. [place] If 7PM-7AM, please contact night-coverage [email or web address] Password [email or web address]7/27/2021, 12:36 PM

## 2019-10-07 NOTE — Progress Notes (Signed)
Nutrition Follow-up  DOCUMENTATION CODES:   Not applicable  INTERVENTION:   Recommend PEG placement given pt with Cortrak x 41 days and poor PO  Change tube feeding:  - Jevity 1.2 @ 30 ml/hr via Cortrak - Increase by 10 ml Q6 hours to goal rate of 50 ml/hr (1200 ml) - ProSource TF 45 ml TID  Tube feeding regimen provides 1560 kcal, 99 grams of protein, and 973 ml of H2O.  NUTRITION DIAGNOSIS:   Inadequate oral intake related to inability to eat as evidenced by NPO status.  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  6/14 - s/p balloon angioplasty for severe stenosis, pt developed lethargy and increased L sided weakness found to have new watershed infarcts, started on vasopressors for cerebral perfusion 6/16 - cortrak placed, tip in stomach 6/17 - pt with increased agitation requiring increased O2 6/18 - pt pulled out Cortrak, Cortrak replaced 6/19 - Cortrak removed 6/20 - intubated 6/23 - Cortrak replaced (tip gastric per Cortrak team), extubated, later reintubated 7/3 - s/p trach   Pt able to nod yes or no. Denies abdominal pain or nausea.   Per RN, TF held last night due to suspected TF aspiration. Suspect PO intake could have contributed to vomiting episode. Jevity 1.2 turned back on at 30 ml/hr this am per MD. Plan to titrate slowly back to goal.   Discussed future nutrition POC with MD. Pt is now 41 days with Cortrak and did not eat much while on PO diet (0-25%). Plan for SLP to reassess swallow function. Recommend PEG placement  regardless of diet advancement as pt will not likely meet needs PO. Family okay with PEG if needed.   Admission weight: 77.5 kg  Current weight: 78.5 kg   Medications: ferrous sulfate, SS novolog, lantus Labs: CBG [id number]  Diet Order:   Diet Order            Diet NPO time specified  Diet effective now                 EDUCATION NEEDS:   Not appropriate for education at this time  Skin:  Skin Assessment: Skin Integrity Issues: Incisions: right groin MASD: buttocks, R groin  Last BM:  7/24  Height:   Ht Readings from Last 1 Encounters:  08/31/19 5\' 1"  (1.549 m)    Weight:   Wt Readings from Last 1 Encounters:  10/07/19 78.5 kg    Ideal Body Weight:  47.7 kg  BMI:  Body mass index is 32.7 kg/m.  Estimated Nutritional Needs:   Kcal:  1550-1750  Protein:  90-105 grams  Fluid:  >1.6 L/day  10/09/19 RD, LDN Clinical Nutrition Pager listed in [facility]

## 2019-10-08 DIAGNOSIS — I6521 Occlusion and stenosis of right carotid artery: Secondary | ICD-10-CM | POA: Diagnosis not present

## 2019-10-08 DIAGNOSIS — Z930 Tracheostomy status: Secondary | ICD-10-CM

## 2019-10-08 LAB — GLUCOSE, CAPILLARY
Glucose-Capillary: 155 mg/dL — ABNORMAL HIGH (ref 70–99)
Glucose-Capillary: 118 mg/dL — ABNORMAL HIGH (ref 70–99)
Glucose-Capillary: 169 mg/dL — ABNORMAL HIGH (ref 70–99)
Glucose-Capillary: 154 mg/dL — ABNORMAL HIGH (ref 70–99)
Glucose-Capillary: 134 mg/dL — ABNORMAL HIGH (ref 70–99)
Glucose-Capillary: 130 mg/dL — ABNORMAL HIGH (ref 70–99)

## 2019-10-08 NOTE — Progress Notes (Signed)
Rehab Admissions Coordinator Note:  Per [facility] team request, this patient was screened by Cheri Rous for appropriateness for an Inpatient Acute Rehab Consult.  At this time, we agree with acute therapy recommendations for Skilled Nursing Facility. At this time, the patient has not demonstrated an ability to tolerate 3 hours of therapy a day. AC will not pursue CIR consult. Will communicate with [facility] team.   Cheri Rous 10/08/2019, 4:18 PM  I can be reached at [phone].

## 2019-10-08 NOTE — Progress Notes (Addendum)
Physical Therapy Treatment Patient Details Name: Donna [name] MRN: [id number] DOB: [birth year] Today's Date: 10/08/2019    History of Present Illness 69 y.o. female with PMH significant for HTN, HLD, CVA 06/2018, DM, anemia, obesity, OSA, and OA. She is known to [facility] and has been followed by Dr. Corliss Skains since 10/2018. Pt underwent R ICA angioplasty on 6/14. Pt with decline in neuro status and hemoglobin on 6/15, found to have a large R groin hematoma.  Pt has had Hbg 5.0 or less and is a Jehovah's witness.  She was intubated on 6/19. Pt underwent tracheostomy on 7/3. PT found to be septic with RLL PNA on 7/6. 7/13 trach tolerating extubation. Patient had a significant event on 7/25 resulting in respiratory distress, oxygen desaturation, and retroperitoneal hematomas in the right iliac/inguinal regions.    PT Comments    Patient was received laying supine in bed and nodded in agreement to PT/OT treatment session. She was able to follow some simple commands inconsistently. Attempted therapeutic exercises in bed, however, she experienced difficulty in following commands. Transferred pt to sitting EOB x 15 min. Blood pressure, HR, and SpO2 were monitored throughout the session. Pt completed the following therapeutic exercises: leaning on L elbow & pulling up with R arm to midline and truncal rotation. Pt was provided with Min A and tactile and verbal cueing for hand placement when transferring from her L elbow <> pulling up on handrail with R arm. MaxAx2 for truncal rotation bilaterally with verbal and tactile cueing. Proper alignment and support of LUE was given to maintain integrity of shoulder joint. MaxA in sitting to keep patient upright. Total x 2 for supine - sit transfer. She was left laying supine in bed with all lines attached, call bell within reach, and hemiparetic arm supported for safety.     Follow Up Recommendations  SNF     Equipment Recommendations  Wheelchair (measurements  PT);Wheelchair cushion (measurements PT);Hospital bed    Recommendations for Other Services       Precautions / Restrictions Precautions Precautions: Fall Precaution Comments: trach, low hbg Required Braces or Orthoses: Other Brace Other Brace: L resting hand splint, prevalon boots Restrictions Weight Bearing Restrictions: No    Mobility  Bed Mobility Overal bed mobility: Needs Assistance Bed Mobility: Supine to Sit;Sit to Supine Rolling: +2 for physical assistance;Max assist   Supine to sit: Total assist;+2 for physical assistance Sit to supine: Total assist;+2 for physical assistance   General bed mobility comments: Total A x 2 for bed mobility to sit EOB and return to supine with physical assist needed to advance trunk and B LE.  Transfers   Equipment used: None             General transfer comment: deferred- fatigue  Ambulation/Gait                 Stairs             Wheelchair Mobility    Modified Rankin (Stroke Patients Only)       Balance Overall balance assessment: Needs assistance Sitting-balance support: Single extremity supported;Feet supported Sitting balance-Leahy Scale: Poor Sitting balance - Comments: Sat EOB for 10-15 mins. Pt was able to pull herself up into a seated position with R hand using verbal and tactile cues, however was a MaxA overall with a posterior/lateral lean. Postural control: Posterior lean;Left lateral lean Standing balance support: Single extremity supported  Cognition Arousal/Alertness: Awake/alert Behavior During Therapy: Flat affect Overall Cognitive Status: Difficult to assess Area of Impairment: Attention;Following commands;Problem solving                   Current Attention Level: Focused   Following Commands: Follows one step commands with increased time;Follows one step commands inconsistently Safety/Judgement: Decreased awareness of  safety;Decreased awareness of deficits Awareness: Intellectual Problem Solving: Slow processing;Requires verbal cues;Decreased initiation;Difficulty sequencing;Requires tactile cues General Comments: Pt with continued efforts in following one step commands. pt with intermittent nodding of "yes" to questions and not answering some.       Exercises      General Comments General comments (skin integrity, edema, etc.): Pt with elevated BP 170s/116 with initial sitting EOB. Guided pt in L sided WB through elbow EOB and truncal rotation      Pertinent Vitals/Pain Pain Assessment: No/denies pain Pain Score: 0-No pain Faces Pain Scale: No hurt Pain Location: mild grimacing with movement Pain Descriptors / Indicators: Grimacing Pain Intervention(s): Monitored during session    Home Living                      Prior Function            PT Goals (current goals can now be found in the care plan section) Acute Rehab PT Goals Patient Stated Goal: none stated PT Goal Formulation: Patient unable to participate in goal setting Time For Goal Achievement: 10/10/19 Potential to Achieve Goals: Fair Progress towards PT goals: Progressing toward goals    Frequency    Min 3X/week      PT Plan Current plan remains appropriate    Co-evaluation PT/OT/SLP Co-Evaluation/Treatment: Yes Reason for Co-Treatment: Complexity of the patient's impairments (multi-system involvement);Necessary to address cognition/behavior during functional activity;For patient/therapist safety PT goals addressed during session: Balance;Strengthening/ROM;Mobility/safety with mobility OT goals addressed during session: ADL's and self-care;Other (comment);Strengthening/ROM (sitting balance)      AM-PAC PT "6 Clicks" Mobility   Outcome Measure  Help needed turning from your back to your side while in a flat bed without using bedrails?: Total Help needed moving from lying on your back to sitting on the side  of a flat bed without using bedrails?: Total Help needed moving to and from a bed to a chair (including a wheelchair)?: Total Help needed standing up from a chair using your arms (e.g., wheelchair or bedside chair)?: Total Help needed to walk in hospital room?: Total Help needed climbing 3-5 steps with a railing? : Total 6 Click Score: 6    End of Session   Activity Tolerance: Patient tolerated treatment well;Patient limited by lethargy Patient left: in bed;with SCD's reapplied;with call bell/phone within reach;with bed alarm set   PT Visit Diagnosis: Other abnormalities of gait and mobility (R26.89);Muscle weakness (generalized) (M62.81);Other symptoms and signs involving the nervous system (R29.898);Hemiplegia and hemiparesis Hemiplegia - Right/Left: Left Hemiplegia - dominant/non-dominant: Non-dominant Hemiplegia - caused by: Cerebral infarction     Time:  -     Charges:                        Elisha Ponder, SPT, ATC

## 2019-10-08 NOTE — Progress Notes (Signed)
Occupational Therapy Treatment Patient Details Name: Donna [name] MRN: [id number] DOB: 15-Nov-1950 Today's Date: 10/08/2019    History of present illness 69 y.o. female with PMH significant for HTN, HLD, CVA 06/2018, DM, anemia, obesity, OSA, and OA. She is known to [facility] and has been followed by Dr. Corliss Skains since 10/2018. Pt underwent R ICA angioplasty on 6/14. Pt with decline in neuro status and hemoglobin on 6/15, found to have a large R groin hematoma.  Pt has had Hbg 5.0 or less and is a Jehovah's witness.  She was intubated on 6/19. Pt underwent tracheostomy on 7/3. PT found to be septic with RLL PNA on 7/6. 7/13 trach tolerating extubation. Patient had a significant event on 7/25 resulting in respiratory distress, oxygen desaturation, and retroperitoneal hematomas in the right iliac/inguinal regions.   OT comments  Pt with improved alertness and responses to questions, though variable in which questions she answers. Pt overall Total A x 2 for bed mobility to sit EOB. Pt demonstrated initial ability to maintain static sitting balance with Min A, following cues to reach R UE to bed rail for support. Pt able to wipe mouth to manage secretions with cues using R hand and sat EOB for 15 minutes. Therapists guided pt in WB through affected L elbow EOB and truncal rotations. With fatigue, pt progressed to requiring Max A to maintain sitting balance with posterior/L lateral lean. Improved edema noted in L hand and resting hand splint donned with collaboration with nursing staff to remove after 4 hours with skin checks.    Follow Up Recommendations  SNF    Equipment Recommendations  Wheelchair cushion (measurements OT);Wheelchair (measurements OT);Hospital bed;Other (comment)    Recommendations for Other Services      Precautions / Restrictions Precautions Precautions: Fall Precaution Comments: trach, low hbg Required Braces or Orthoses: Other Brace Other Brace: L resting hand splint, prevalon  boots Restrictions Weight Bearing Restrictions: No       Mobility Bed Mobility Overal bed mobility: Needs Assistance Bed Mobility: Supine to Sit;Sit to Supine     Supine to sit: Total assist;+2 for physical assistance Sit to supine: Total assist;+2 for physical assistance   General bed mobility comments: Total A x 2 for bed mobility to sit EOB and return to supine with physical assist needed to advance trunk and B LE.  Transfers                 General transfer comment: deferred- fatigue    Balance Overall balance assessment: Needs assistance Sitting-balance support: Single extremity supported;Feet supported Sitting balance-Leahy Scale: Poor Sitting balance - Comments: Pt initially Min A to maintain static sitting balance reaching and holding to bedrail with R hand with cues. Able to correct balance with Min A and cues initially, but with fatigue, progressed to Max A with posterior/L lateral lean                                   ADL either performed or assessed with clinical judgement   ADL Overall ADL's : Needs assistance/impaired     Grooming: Wash/dry face;Sitting;Supervision/safety Grooming Details (indicate cue type and reason): Supervision to wipe mouth due to secretions sitting EOB while therapists provided physical assistance to maintain sitting balance                               General  ADL Comments: Pt with improved alertness and following of commands. Pt with increased lethargy today     Vision   Vision Assessment?: Vision impaired- to be further tested in functional context   Perception     Praxis      Cognition Arousal/Alertness: Awake/alert Behavior During Therapy: Flat affect Overall Cognitive Status: Difficult to assess Area of Impairment: Attention;Following commands;Problem solving                   Current Attention Level: Sustained   Following Commands: Follows one step commands with increased  time Safety/Judgement: Decreased awareness of safety;Decreased awareness of deficits Awareness: Intellectual Problem Solving: Slow processing;Requires verbal cues;Decreased initiation;Difficulty sequencing;Requires tactile cues General Comments: Pt with continued efforts in following one step commands. pt with intermittent nodding of "yes" to questions and not answering some.         Exercises     Shoulder Instructions       General Comments Pt with elevated BP 170s/116 with initial sitting EOB. Guided pt in L sided WB through elbow EOB and truncal rotation     Pertinent Vitals/ Pain       Pain Assessment: Faces Faces Pain Scale: No hurt Pain Intervention(s): Monitored during session  Home Living                                          Prior Functioning/Environment              Frequency  Min 2X/week        Progress Toward Goals  OT Goals(current goals can now be found in the care plan section)  Progress towards OT goals: Progressing toward goals  Acute Rehab OT Goals Patient Stated Goal: none stated OT Goal Formulation: Patient unable to participate in goal setting Time For Goal Achievement: 10/16/19 Potential to Achieve Goals: Fair ADL Goals Pt Will Perform Grooming: with supervision;sitting Pt Will Perform Upper Body Bathing: with mod assist;bed level;sitting Additional ADL Goal #1: Pt will tolerate splint wear Lt UE Additional ADL Goal #2: Pt to demonstrate ability to maintain static sitting balance EOB with no more than min guard for 10 minutes in order to improve endurance for ADLs Additional ADL Goal #3: Pt to scan and identify at least 3 ADL items in L visual field to improve safety and awareness during daily tasks.  Plan Discharge plan remains appropriate    Co-evaluation    PT/OT/SLP Co-Evaluation/Treatment: Yes Reason for Co-Treatment: Complexity of the patient's impairments (multi-system involvement);Necessary to address  cognition/behavior during functional activity;For patient/therapist safety   OT goals addressed during session: ADL's and self-care;Other (comment);Strengthening/ROM (sitting balance)      AM-PAC OT "6 Clicks" Daily Activity     Outcome Measure   Help from another person eating meals?: Total Help from another person taking care of personal grooming?: A Lot Help from another person toileting, which includes using toliet, bedpan, or urinal?: Total Help from another person bathing (including washing, rinsing, drying)?: Total Help from another person to put on and taking off regular upper body clothing?: Total Help from another person to put on and taking off regular lower body clothing?: Total 6 Click Score: 7    End of Session Equipment Utilized During Treatment: Oxygen  OT Visit Diagnosis: Muscle weakness (generalized) (M62.81);Cognitive communication deficit (R41.841);Hemiplegia and hemiparesis Symptoms and signs involving cognitive functions: Cerebral infarction Hemiplegia - Right/Left: Left  Hemiplegia - dominant/non-dominant: Non-Dominant Hemiplegia - caused by: Cerebral infarction   Activity Tolerance Patient limited by fatigue   Patient Left in bed;with call bell/phone within reach;with bed alarm set;with SCD's reapplied   Nurse Communication Mobility status;Other (comment) (splinting)        Time: 9485-4627 OT Time Calculation (min): 34 min  Charges: OT General Charges $OT Visit: 1 Visit OT Treatments $Therapeutic Activity: 8-22 mins  Lorre Munroe, OTR/L   Lorre Munroe 10/08/2019, 1:53 PM

## 2019-10-08 NOTE — Progress Notes (Signed)
PROGRESS NOTE  Donna [name]  DOB: [birth year]  PCP: Donna [name], [place] BPZ:[id number]  DOA: 08/25/2019  LOS: 44 days   No chief complaint on file.  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  6/14, patient was admitted by Neuro IR and underwent cerebral angiogram s/p RT ICA cervical/ petrous junction balloon angioplasty for severe stenosis via right femoral approach. Postprocedure, patient was placed on ASA/ brillinta and monitored in Neuro ICU.  6/15, noted hypotension, increased left sided weakness, lethargy and left-sided neglect. Hgb drop from 13.3 to 6.9 with new AKI. Post MRI/ MRA showed scattered small acute infarcts along the right MCA/ watershed territory suspected to be resultant either post procedure vs hypoperfusion. Continues to have left-sided neglect. 6/15, CT abd/ pelvis was obtained given concern for retroperitoneal bleed which showed a mild to moderate amount of retroperitoneal hematoma in the right iliac and inguinal regiones with moderate size hematoma noted the the soft tissues anterior to the musculature of the right hip; no definite intrapertioneal hemorrhage. INR, PT, and platelets rechecked and were wnl. Vascular surgery was consulted.  No intervention done by vascular given bleeding was mostly into the muscle and tissue.  Patient has had long hospitalization since then.  6/18 -intubated, reintubated 6/23 and 6/28 for failed trial of extubation. 7/3 -underwent tracheostomy 7/25-7/26, patient developed respiratory distress with significant desaturation. CXR did not show any acute finding. Tube feed was kept on held given concern for aspiration. Tracheal aspiration was performed.  Received high-dose Solu-Medrol and Lasix.  PCCM consulted.   CTA chest negative for PE but showed GGO and nodular opacities in bilateral upper lobes.   Subjective: Patient was seen and examined this morning.  Elderly thin built African-American female lying on bed. Acknowledges my presence.  Able to follow commands to move right extremities.  Muscle strength 2/5 on the left upper extremity and 1/5 in the left lower  Tube feeding currently running at 25 mL/h.  Assessment/Plan: Acute on chronic respiratory failure with hypoxia:  Aspiration pneumonia. -Currently on trach collar at 5 L/min.  -7/25-7/26, patient developed respiratory distress with significant desaturation. CXR did not show any acute finding. Tube feed was kept on held given concern for aspiration. Tracheal aspiration was performed.  Received high-dose Solu-Medrol and Lasix.  PCCM consulted.  CTA chest negative for PE but showed GGO and nodular opacities in bilateral upper lobes.  -IV Solu-Medrol 125 mg once on 7/26. -Started on Pulmicort and DuoNeb -Currently not on antibiotics. -Appreciate PCCM input. 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  -No anticoagulation or antiplatelet given bleed as above -Final disposition likely SNF if stable -Neurology signed off.  Acutemetabolicencephalopathy -Improved. Responds to questions by nodding and follows command. -cotninue seroquel at 160m QHS (has helped come off precedex) -monitor QTc intermittently.  Dysphagia:  -Likely due to CVA. -On tube feed via core track.  May need PEG tube going forward unless oral intake improves.  Previous hospitalist discussed this with patient's son Donna [name]  I ordered for PEG tube placement by IR. -Aspiration precaution  Volume Overload Essential  hypertension -Appears euvolemic on exam.  Intermittently received IV Lasix. -Goal to keep  SBP<160 given intracranial stenosis. -Currently on amlodipine 10 mg daily. -Continue to monitor blood pressure  Acute blood loss anemia  Anemia of critical illness:  -Jehovah's Witness, no blood products including albumin. Hgb 6.6 on 7/27 (relatively stable).   -Continue severe anemia protocol: Iron, Folic acid, vitamin Z02, Monitor off Epogen and Vit C that was previously given -Intermittently monitor H&H -Avoid blood draws if possible  Diarrhea-resolved -most likely due to tube feedings -Continue to monitor  Uncontrolled diabetes with hyperglycemia Lab Results  Component Value Date   HGBA1C 5.2 10/03/2019   -Currently blood sugar trending less than 200 on Levemir 12 units and sliding scale insulin.  Continue the same with Accu-Cheks.  Debility/physical deconditioning -Continue PT/OT when medically stable.  Mobility: PT evaluation Code Status:   Code Status: Full Code  Nutritional status: Body mass index is 32.99 kg/m. Nutrition Problem: Inadequate oral intake Etiology: inability to eat Signs/Symptoms: NPO status Diet Order            Diet NPO time specified  Diet effective now                 DVT prophylaxis: Place and maintain sequential compression device Start: 09/26/19 1319   Antimicrobials:  Currently on none Fluid: None  Consultants: PCCM, neuro IR, IR, Family Communication:  None at bedside  Status is: Inpatient  Remains inpatient appropriate because:Ongoing diagnostic testing needed not appropriate for outpatient work up and IV treatments appropriate due to intensity of illness or inability to take PO   Dispo: The patient is from: Home              Anticipated d/c is to: SNF              Anticipated d/c date is: 3 days              Patient currently is not medically stable to d/c.    Infusions:   sodium chloride 10 mL/hr at 09/25/19 1800    dextrose 5 % and 0.45% NaCl 50 mL/hr at 10/07/19 2000   feeding supplement (JEVITY 1.2 CAL) 25 mL/hr at 10/07/19 2000    Scheduled Meds:  amLODipine  10 mg Per Tube Daily   atorvastatin  40 mg Per NG tube Daily   budesonide (PULMICORT) nebulizer solution  0.5 mg Nebulization BID   chlorhexidine gluconate (MEDLINE KIT)  15 mL Mouth Rinse BID   Chlorhexidine Gluconate Cloth  6 each Topical Daily   famotidine  20 mg Per Tube BID   feeding supplement (PROSource TF)  45 mL Per Tube TID   ferrous sulfate  220 mg Per Tube TID WC   hydrALAZINE  100 mg Per Tube Q6H   hydrocerin   Topical TID   insulin aspart  0-15 Units Subcutaneous Q4H   insulin aspart  4 Units Subcutaneous Q4H   insulin glargine  12 Units Subcutaneous Daily   ipratropium-albuterol  3 mL Nebulization Q4H   mouth rinse  15 mL Mouth Rinse 10 times per day   QUEtiapine  100 mg Per Tube QHS   sodium chloride flush  10-40 mL Intracatheter Q12H    Antimicrobials: Anti-infectives (From admission, onward)   Start     Dose/Rate Route Frequency Ordered Stop   09/16/19 2130  vancomycin (VANCOCIN) IVPB 1000 mg/200 mL premix  Status:  Discontinued        1,000 mg 200 mL/hr over 60 Minutes Intravenous Every 12 hours 09/16/19 0839 09/18/19 0756   09/16/19 1630  piperacillin-tazobactam (  ZOSYN) IVPB 3.375 g        3.375 g 12.5 mL/hr over 240 Minutes Intravenous Every 8 hours 09/16/19 0835 09/23/19 1153   09/16/19 1000  ceFEPIme (MAXIPIME) 1 g in sodium chloride 0.9 % 100 mL IVPB  Status:  Discontinued        1 g 200 mL/hr over 30 Minutes Intravenous Every 12 hours 09/16/19 0756 09/16/19 0803   09/16/19 0845  piperacillin-tazobactam (ZOSYN) IVPB 3.375 g        3.375 g 100 mL/hr over 30 Minutes Intravenous  Once 09/16/19 0834 09/16/19 1007   09/16/19 0845  vancomycin (VANCOREADY) IVPB 1500 mg/300 mL        1,500 mg 150 mL/hr over 120 Minutes Intravenous  Once 09/16/19 0839 09/16/19 1248   09/16/19 0830   piperacillin-tazobactam (ZOSYN) IVPB 3.375 g  Status:  Discontinued        3.375 g 12.5 mL/hr over 240 Minutes Intravenous Every 8 hours 09/16/19 0803 09/16/19 0834   09/16/19 0800  vancomycin (VANCOCIN) IVPB 1000 mg/200 mL premix  Status:  Discontinued        1,000 mg 200 mL/hr over 60 Minutes Intravenous Every 12 hours 09/16/19 0756 09/16/19 0839   08/31/19 1400  Ampicillin-Sulbactam (UNASYN) 3 g in sodium chloride 0.9 % 100 mL IVPB  Status:  Discontinued        3 g 200 mL/hr over 30 Minutes Intravenous Every 6 hours 08/31/19 1357 09/02/19 0819   08/25/19 0723  ceFAZolin (ANCEF) IVPB 2g/100 mL premix        2 g 200 mL/hr over 30 Minutes Intravenous 60 min pre-op 08/25/19 0723 08/25/19 0930      PRN meds: acetaminophen **OR** acetaminophen (TYLENOL) oral liquid 160 mg/5 mL **OR** acetaminophen, albuterol, fentaNYL (SUBLIMAZE) injection, hydrALAZINE, labetalol, lip balm, oxyCODONE, sodium chloride flush   Objective: Vitals:   10/08/19 0740 10/08/19 0759  BP: (!) 155/73 (!) 155/73  Pulse: 78 102  Resp: 23 19  Temp: 98.6 F (37 C)   SpO2: 100% 100%    Intake/Output Summary (Last 24 hours) at 10/08/2019 1007 Last data filed at 10/08/2019 0400 Gross per 24 hour  Intake 1620 ml  Output 1600 ml  Net 20 ml   Filed Weights   10/06/19 0400 10/07/19 0300 10/08/19 0500  Weight: 78.9 kg 78.5 kg 79.2 kg   Weight change: 0.7 kg Body mass index is 32.99 kg/m.   Physical Exam: General exam: Appears calm and comfortable.  Not in acute physical distress Skin: No rashes, lesions or ulcers. HEENT: Has a tracheostomy tube anteriorly, core track to nose Lungs: Diminished air entry on both bases CVS: Regular rate and rhythm, no murmur GI/Abd soft, nontender, nondistended, bowel sound present CNS: Alert, awake, able to follow motor commands Psychiatry: Mood and affect normal Extremities: No pedal edema, no calf tenderness  Data Review: I have personally reviewed the laboratory data and  studies available.  Recent Labs  Lab 10/07/19 0617  WBC 7.0  HGB 6.6*  HCT 23.5*  MCV 101.7*  PLT 262   Recent Labs  Lab 10/04/19 0500 10/06/19 1155  NA 140 143  K 4.0 3.8  CL 100 103  CO2 32 29  GLUCOSE 260* 223*  BUN 20 16  CREATININE 0.69 0.71  CALCIUM 9.0 9.2  MG 2.0  --   PHOS  --  3.4   Lab Results  Component Value Date   HGBA1C 5.2 10/03/2019       Component Value Date/Time  CHOL 217 (H) 05/22/2019 1312   TRIG 114 05/22/2019 1312   HDL 65 05/22/2019 1312   CHOLHDL 3.3 05/22/2019 1312   LDLCALC 132 (H) 05/22/2019 1312   LABVLDL 20 05/22/2019 1312    Signed, Terrilee Croak, MD [facility] Pager: [phone] (Secure Chat preferred). 10/08/2019

## 2019-10-08 NOTE — Progress Notes (Signed)
NAME:  Donna [name], MRN:  [id number], DOB:  Feb 24, 1951, LOS: 44 ADMISSION DATE:  08/25/2019, CONSULTATION DATE:  08/27/2019 REFERRING MD:  Dr. 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  Admitted by Neuro IR on 6/14 for cerebral angiogram s/p RT ICA cervical/ petrous junction balloon angioplasty for severe stenosis via right femoral approach.  Was placed on ASA/ brillinta.  Monitored in Neuro ICU.  Noted to have developed hypotension, increased left sided weakness and lethargy on 6/15 am with Hgb drop from 13.3 to 6.9 with new AKI.  Post MRI/ MRA showed scattered small acute infarcts along the right MCA/ watershed territory suspected to be resultant either post procedure vs hypoperfusion.    A CT abd/ pelvis was obtained given concern for retroperitoneal bleed which showed a mild to moderate amount of retroperitoneal hematoma in the right iliac and inguinal regiones with moderate size hematoma noted the the soft tissues anterior to the musculature of the right hip; no definite intrapertioneal hemorrhage. INR, PT, and platelets rechecked and were wnl.  Vascular surgery was consulted with plans to observe given bleeding was mostly into the muscle and tissue.  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   7/4 - TRACHEOSTOMY  7/11 - pall  care meeting - full code. Patient feels less congested, ventilatory setting was adjusted to see if she can be liberated from ventilator, avoiding tissue hypoxemia  7/12 - afebrile since 7/8. + 22L since admit. RN feels patient volume overloaded.Does SBT dailly of varying duration few to several hours and then gets fatigued with tachypnea and tachycardia and agitation. Left sided dense hemiplegia continues. Periodically pruposeful on right side PRecedex continues but unsure if patient needs it/ Diarrhea + - on docusate  7/13 - On PSV via trach. Still unable to come off precedex gtt. ? Diarrhea btter after stopping docusate. No labs as part of blood conservation. Got lasix yesterday. Last dose zosyn today. Diarrhea + but improved after dc laxative. Needed prn for high BP last night. Trach sutures removed   Consults:  NIR - primary Neurology (stroke has signed off 6/24) Vascular surgery  PCCM Palliative Care Hematology  Procedures:   ETT 6/19 > 6/23,  6/23 > Trach 7/4  Significant Diagnostic Tests:   6/14 cerebral angiogram s/p RT ICA cer/petrous junction balloon angioplasty for severe stenosis.   6/15 MRA/ MRI brain  > Scattered small acute infarcts along the right MCA/watershed territory. Solitary small acute infarct in the left parietal cortex and right cerebellum. Improved right ICA patency at the petrous segment. Known severe right M1 segment stenosis. 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  6/15 CT a/p wo contrast > Mild to moderate amount of retroperitoneal hematoma is noted in the right iliac and inguinal regions. Moderate size hematoma is noted in the soft tissues anterior to the musculature of the right hip. No definite intraperitoneal hemorrhage is noted. Moderate size fat  containing periumbilical hernia. Aortic Atherosclerosis.   Echo 6/16 > normal LVEF  6/29 MRI brain > Progressed size and confluence of ischemia throughout much of the right hemisphere white  matter since 08/26/2019. Associated petechial blood products but no malignant hemorrhagic transformation or mass Effect. There are also multiple new lacunar type infarcts elsewhere, including the brainstem and the left basal ganglia. Underlying chronic infarcts of the right basal ganglia and left Cerebellum.   Micro Data:  6/10 SARS 2 >> neg 6/14 MRSA PCR >> neg 6/20 trach asp >> few diptheroids/ corynebacterium species 6/18 BC x 2 >> neg 7/6 Morganella 7/28 resp  >  Antimicrobials:  6/14 cefazolin pre-op  6/20 unasyn >> 09/02/2019 7/6 vancomycin > 7/8 7/6 Zosyn>> 7/12 (Morganella)  Interim history/subjective:  Denies pain No dyspnea Afebrile Saturation 100% on 5 L trach collar   Objective   Blood pressure (!) 155/73, pulse 102, temperature 98.6 F (37 C), temperature source Oral, resp. rate 20, height 5\' 1"  (1.549 m), weight 79.2 kg, SpO2 100 %.    FiO2 (%):  [28 %] 28 %   Intake/Output Summary (Last 24 hours) at 10/08/2019 1542 Last data filed at 10/08/2019 0400 Gross per 24 hour  Intake 1220 ml  Output 1600 ml  Net -380 ml   Filed Weights   10/06/19 0400 10/07/19 0300 10/08/19 0500  Weight: 78.9 kg 78.5 kg 79.2 kg    Examination: General: obese chronically ill appearing female in NAD HEENT: [place]/AT, PERRL, Trach in place 6 shiley cuffless CDI.  Neuro: Awake, alert, interactive.  CV: RRR, no MRG PULM: Bilateral clear breath sounds, no rhonchi, no accessory muscle use GI: soft, bowel sounds active in all 4 quadrants, non-tender, non-distended,  Extremities: No acute deformity.  Skin: grossly intact  Resolved Hospital Problem list   Hypotension  AKI Sepsis VAP - Morganella s/p zosyn ending 09/23/19  Assessment & Plan:    Acute on chronic respiratory failure with hypoxia requiring mechanical ventilation.   -Failed trial of extubation due to upper airway stridor. Cords visibly scarred on reintubation.  -S/P trach 7/4 -Hypoxia on 7/26 felt to be secondary to  secretions. CTA demonstrates some apical GGO possibly representing atypical infection. She has improved with pulmonary hygiene alone. No PE.  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  Acute encephalopathy -Continues to slowly show improvement P: Per primary Ongoing PT/OT PEG planned  PCCM will see again next week   8/26 MD. [facility]. [facility]  If no response to pager , please call [phone]   10/08/2019

## 2019-10-08 NOTE — Progress Notes (Signed)
Referring Physician(s): Melvenia Beam  Supervising Physician: Luanne Bras  Patient Status:  [facility] - In-pt  Subjective: Right ICA cervical/petrous junction stenosis s/p revascularization using balloon angioplasty via right radial and right femoral approach6/14/2021by Dr. Estanislado Pandy. Mild-moderate right thigh (with some retroperitoneal component) hematoma. Acute blood loss anemia, Jehovah's Witness. Patientawakelaying in bed, tracheostomy in place,no sedation. Answers questions mostly with nods, but does attempt to vocalize with words at times.  Follows commands with right side.  She does make an attempt to squeeze my hand on the left. No movement in left toes/lower extremity.  Allergies: Patient has no known allergies.  Medications: Prior to Admission medications   Medication Sig Start Date End Date Taking? Authorizing Provider  amLODipine (NORVASC) 10 MG tablet Take 1 tablet (10 mg total) by mouth daily. 05/12/19  Yes Minette Brine, FNP  aspirin EC 81 MG tablet Take 81 mg by mouth every evening.   Yes [provider]  Calcium Carbonate-Vitamin D (CALCIUM-D PO) Take 2 tablets by mouth daily at 12 noon.    Yes [provider]  carvedilol (COREG) 6.25 MG tablet TAKE 1 TABLET(6.25 MG) BY MOUTH TWICE DAILY Patient taking differently: Take 6.25 mg by mouth 2 (two) times daily with a meal.  07/31/19  Yes Minette Brine, FNP  hydrALAZINE (APRESOLINE) 50 MG tablet Take 1 tablet (50 mg total) by mouth 2 (two) times daily. 06/16/19 09/14/19 Yes Elouise Munroe, MD  Insulin Degludec-Liraglutide (XULTOPHY) 100-3.6 UNIT-MG/ML SOPN Inject 30 Units into the skin daily. 08/14/19  Yes Minette Brine, FNP  telmisartan-hydrochlorothiazide (MICARDIS HCT) 40-12.5 MG tablet Take 1 tablet by mouth daily. 07/31/19  Yes Minette Brine, FNP  ticagrelor (BRILINTA) 90 MG TABS tablet Take 1 tablet (90 mg total) by mouth 2 (two) times daily. 06/12/19  Yes Elouise Munroe, MD  vitamin C  (ASCORBIC ACID) 250 MG tablet Take 500 mg by mouth daily at 12 noon.   Yes [provider]  blood glucose meter kit and supplies KIT Dispense based on patient and insurance preference. Use up to four times daily as directed. (FOR ICD-9 250.00, 250.01). 05/10/18   Minette Brine, FNP  Blood Glucose Monitoring Suppl (TRUE METRIX METER) w/Device KIT 1 each by Does not apply route in the morning, at noon, in the evening, and at bedtime. 05/22/19   Minette Brine, FNP  Evolocumab (REPATHA SURECLICK) 915 MG/ML SOAJ Inject 140 mg into the skin every 14 (fourteen) days. 06/30/19   Elouise Munroe, MD  glucose blood (TRUE METRIX BLOOD GLUCOSE TEST) test strip Check blood sugar 4 times a day before meals and bedtime 05/22/19   Minette Brine, FNP  Insulin Glargine-Lixisenatide (SOLIQUA) 100-33 UNT-MCG/ML SOPN Inject 25 Units into the skin daily. Patient taking differently: Inject 30 Units into the skin daily.  07/14/19   Minette Brine, FNP  Multiple Vitamin (MULTIVITAMIN PO) Take 1 tablet by mouth daily at 12 noon.     [provider]  Omega-3 Fatty Acids (OMEGA-3 PLUS PO) Take 2 capsules by mouth daily at 12 noon.    [provider]     Vital Signs: BP (!) 155/73   Pulse 102   Temp 98.6 F (37 C) (Oral)   Resp 20   Ht 5' 1"  (1.549 m)   Wt 174 lb 9.7 oz (79.2 kg)   SpO2 100%   BMI 32.99 kg/m   Physical Exam  NAD, alert Neuro: Alert today, nods to yes/no questions appropriately, follows simple commands of right side. PERRL  bilaterally. Follows commands with right side and makes attempt to squeeze my hand on the left today. Distal pulses (DPs) 1+ bilaterally and (radial) 2+ bilaterally.    MSK: thigh soft, compressible.  No signs of bleeding/hematoma.   Imaging: CT ANGIO CHEST PE W OR WO CONTRAST  Result Date: 10/06/2019 CLINICAL DATA:  Respiratory distress. Evaluate for pulmonary embolism. Patient nonverbal. EXAM: CT ANGIOGRAPHY CHEST WITH CONTRAST TECHNIQUE: Multidetector  CT imaging of the chest was performed using the standard protocol during bolus administration of intravenous contrast. Multiplanar CT image reconstructions and MIPs were obtained to evaluate the vascular anatomy. CONTRAST:  71m OMNIPAQUE IOHEXOL 350 MG/ML SOLN COMPARISON:  Chest radiograph 10/06/2019.  Cardiac CT of 04/16/2019. FINDINGS: Cardiovascular: The quality of this exam for evaluation of pulmonary embolism is moderate. Limitations include mild motion, suboptimal bolus timing, patient arm position (not raised above the head). No pulmonary embolism to the lobar level. Right PICC line tip at low SVC. Aortic atherosclerosis. Bovine arch. Mild cardiomegaly and left ventricular hypertrophy. Lad coronary artery calcification. Mediastinum/Nodes: Enlarged, heterogeneous thyroid, likely due to multinodular goiter. No dominant mass. Endotracheal tube terminates well above the carina. Precarinal node measures 1.3 cm on 39/6. Prominent nodal tissue along the peribronchovascular interstitium in both infrahilar regions, likely reactive. Nasogastric tube is incompletely imaged but followed to the level of the stomach. Tiny hiatal hernia. Lungs/Pleura: Tiny right pleural effusion. Bilateral upper lobe predominant areas of ground-glass and nodular pulmonary opacities. Some areas of mild septal thickening. No well-defined lobar consolidation. No dominant lung mass. Example areas of nodularity including in the left upper lobe at 9 mm on 31/7 and the left lower lobe at 6 mm on 53/7. Upper Abdomen: Normal imaged portions of the liver, spleen, pancreas, gallbladder, right adrenal gland, kidneys. Left adrenal thickening. Musculoskeletal: Right hemidiaphragm elevation. No acute osseous abnormality. Review of the MIP images confirms the above findings. IMPRESSION: 1. Moderate quality evaluation for pulmonary embolism. No pulmonary embolism with limitations above. 2. Bilateral upper lobe predominant areas of ground-glass and nodular  pulmonary opacities. Favor atypical infection. 3. Tiny right pleural effusion. 4. Coronary artery atherosclerosis. Mild thoracic adenopathy, favored to be reactive. 5. Tiny hiatal hernia. 6. Aortic Atherosclerosis (ICD10-I70.0). Electronically Signed   By: KAbigail MiyamotoM.D.   On: 10/06/2019 14:42   DG Chest Port 1 View  Result Date: 10/06/2019 CLINICAL DATA:  Shortness of breath EXAM: PORTABLE CHEST 1 VIEW COMPARISON:  10/05/2019 FINDINGS: Stable positioning of tracheostomy tube. Right-sided PICC line in terminating at the distal SVC. Enteric tube courses below the diaphragm, distal tip extending beyond the inferior margin of the film. Stable cardiomediastinal contours. Atherosclerotic calcification of the aortic knob. No new focal airspace consolidation. No pleural effusion or pneumothorax. IMPRESSION: No acute cardiopulmonary findings. Stable lines and tubes, as above. Electronically Signed   By: NDavina PokeD.O.   On: 10/06/2019 11:08   DG Chest Port 1 View  Result Date: 10/05/2019 CLINICAL DATA:  Acute respiratory distress EXAM: PORTABLE CHEST 1 VIEW COMPARISON:  10/02/2019 FINDINGS: Tracheostomy, right upper extremity PICC line, and nasoenteric feeding tube extending into the upper abdomen beyond the margin of the examination are unchanged. Lungs are clear. No pneumothorax or pleural effusion. Cardiac size within normal limits. Pulmonary vascularity is normal. IMPRESSION: Stable examination. No focal pulmonary infiltrate identified. Preserved pulmonary insufflation. Electronically Signed   By: AFidela SalisburyMD   On: 10/05/2019 22:16    Labs:  CBC: Recent Labs    09/18/19 [id number]/10/21 0500 09/26/19 0257 10/07/19  0617  WBC 15.3* 10.4 11.3* 7.0  HGB 4.7* 4.7* 6.8* 6.6*  HCT 16.4* 17.5* 22.5* 23.5*  PLT 435* 447* 247 262    COAGS: Recent Labs    11/27/18 0728 08/18/19 0705 08/26/19 1715 08/27/19 0230  INR 0.9 0.9 1.1 1.2    BMP: Recent Labs    09/21/19 0500  09/26/19 0537 10/04/19 0500 10/06/19 1155  NA 142 141 140 143  K 3.3* 3.8 4.0 3.8  CL 103 106 100 103  CO2 30 30 32 29  GLUCOSE 145* 141* 260* 223*  BUN 41* 17 20 16   CALCIUM 8.6* 8.7* 9.0 9.2  CREATININE 0.81 0.58 0.69 0.71  GFRNONAA >60 >60 >60 >60  GFRAA >60 >60 >60 >60    LIVER FUNCTION TESTS: Recent Labs    09/17/19 0703 09/17/19 0703 09/20/19 0500 09/21/19 0500 09/26/19 0537 10/06/19 1155  BILITOT 1.1  --  0.7 0.3 0.5  --   AST 32  --  27 31 31   --   ALT 23  --  29 36 42  --   ALKPHOS 82  --  95 81 57  --   PROT 6.6  --  7.4 7.1 5.5*  --   ALBUMIN 1.6*   < > 1.5* 1.6* 1.8* 2.5*   < > = values in this interval not displayed.    Assessment and Plan:  Right ICA cervical/petrous junction stenosis s/p revascularization using balloon angioplasty via right radial and right femoral approach6/14/2021by Dr. Estanislado Pandy. Mild-moderate right thigh (with some retroperitoneal component) hematoma. Acute blood loss anemia, Jehovah's Witness. Patient's conditionstable Trach in place.  Appears she is having increased secretions with need for frequent suction.  Right groinand right radial puncture sitesstable, no signs of active bleeding.  For possible transfer to SNF (insurance denied LT[facility]),awaiting family decision regarding SNF placement. Continue to hold Brilinta/Aspirin/Lovenox.Agree with CCM plans toavoid excess blood drawl. Further plans perTRH/neurology/palliative care- appreciate and agree with management. NIR to follow.  Electronically Signed: Docia Barrier, PA 10/08/2019, 12:11 PM   I spent a total of 15 Minutes at the the patient's bedside AND on the patient's hospital floor or unit, greater than 50% of which was counseling/coordinating care for right ICA stenosis.

## 2019-10-08 NOTE — Progress Notes (Signed)
SLP Cancellation Note  Patient Details Name: Donna [name] MRN: [id number] DOB: 04-30-1950   Cancelled treatment:        Pt on caseload; new orders received. Per notes, pt desated night of 7/25-26, made NPO.  Entered room for diagnostic treatment and ability to resume po's and found pt partially reclined to left side of bed with obvious labored/distressed respirations. Sats 67% and falling. RN called and sats reached 53%. Deep suctioned by RN removing thick mucous and sats gradually increased to 93% over a 5 minute period. Will hold for now and recheck as schedule allows.  MD's documentation noted he feels she may need a PEG.   Royce Macadamia 10/08/2019, 10:25 AM   Breck Coons Lonell Face.Ed Nurse, children's [phone] Office [phone]

## 2019-10-09 ENCOUNTER — Telehealth: Payer: Self-pay | Admitting: Nurse Practitioner

## 2019-10-09 ENCOUNTER — Inpatient Hospital Stay (HOSPITAL_COMMUNITY): Payer: Medicare HMO

## 2019-10-09 ENCOUNTER — Encounter (HOSPITAL_COMMUNITY): Payer: Self-pay | Admitting: Interventional Radiology

## 2019-10-09 DIAGNOSIS — I6521 Occlusion and stenosis of right carotid artery: Secondary | ICD-10-CM | POA: Diagnosis not present

## 2019-10-09 LAB — GLUCOSE, CAPILLARY
Glucose-Capillary: 128 mg/dL — ABNORMAL HIGH (ref 70–99)
Glucose-Capillary: 147 mg/dL — ABNORMAL HIGH (ref 70–99)
Glucose-Capillary: 140 mg/dL — ABNORMAL HIGH (ref 70–99)
Glucose-Capillary: 129 mg/dL — ABNORMAL HIGH (ref 70–99)
Glucose-Capillary: 114 mg/dL — ABNORMAL HIGH (ref 70–99)
Glucose-Capillary: 130 mg/dL — ABNORMAL HIGH (ref 70–99)

## 2019-10-09 LAB — CBC
HCT: 27.4 % — ABNORMAL LOW (ref 36.0–46.0)
Hemoglobin: 7.6 g/dL — ABNORMAL LOW (ref 12.0–15.0)
MCH: 28.6 pg (ref 26.0–34.0)
MCHC: 27.7 g/dL — ABNORMAL LOW (ref 30.0–36.0)
MCV: 103 fL — ABNORMAL HIGH (ref 80.0–100.0)
Platelets: 274 10*3/uL (ref 150–400)
RBC: 2.66 MIL/uL — ABNORMAL LOW (ref 3.87–5.11)
RDW: 18.5 % — ABNORMAL HIGH (ref 11.5–15.5)
WBC: 4.8 10*3/uL (ref 4.0–10.5)
nRBC: 0 % (ref 0.0–0.2)

## 2019-10-09 MED ORDER — IPRATROPIUM-ALBUTEROL 0.5-2.5 (3) MG/3ML IN SOLN
3.0000 mL | Freq: Three times a day (TID) | RESPIRATORY_TRACT | Status: DC
  Administered 2019-10-09 – 2019-10-24 (×43): 3 mL via RESPIRATORY_TRACT
  Filled 2019-10-09 (×46): qty 3

## 2019-10-09 NOTE — H&P (Signed)
Chief Complaint: Dysphagia  Referring Physician(s): Dahal  Supervising Physician: Corrie Mckusick  Patient Status: [facility] - In-pt  History of Present Illness: Donna [name] is a 69 y.o. female who is well known to our service.  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  She is s/p revascularization using balloon angioplasty via right radial and right femoral approach6/14/2021by Dr. Estanislado Pandy.  She has acute blood loss anemia and is a Jehovah's Witness.  Current Hgb is 6.2.  Medical staff feels she is stable to undergo gastrostomy tube placement at this time.   Past Medical History:  Diagnosis Date  . Anemia    low iron - not since having fibroids removed  . Arthritis   . Hyperlipidemia   . Hypertension   . Sleep apnea    just recently diagnosed with it, has not gotten the Cpap (done 10/29/18)  . Stroke [facility])    weakness on left side  . Type II diabetes mellitus ([place])     Past Surgical History:  Procedure Laterality Date  . COLONOSCOPY    . IR ANGIO INTRA EXTRACRAN SEL COM CAROTID INNOMINATE BILAT MOD SED  10/18/2018  . IR ANGIO INTRA EXTRACRAN SEL COM CAROTID INNOMINATE BILAT MOD SED  08/18/2019  . IR ANGIO VERTEBRAL SEL SUBCLAVIAN INNOMINATE UNI R MOD SED  08/18/2019  . IR ANGIO VERTEBRAL SEL VERTEBRAL BILAT MOD SED  10/18/2018  . IR ANGIO VERTEBRAL SEL VERTEBRAL UNI L MOD SED  08/18/2019  . IR PTA INTRACRANIAL  08/25/2019  . IR US GUIDE VASC ACCESS RIGHT  10/18/2018  . IR US GUIDE VASC ACCESS RIGHT  08/18/2019  . IR US GUIDE VASC ACCESS RIGHT  08/25/2019  . MYOMECTOMY    . RADIOLOGY WITH ANESTHESIA N/A 08/18/2019   Procedure: STENTING;  Surgeon: Luanne Bras, MD;  Location: [place];  Service: Radiology;  Laterality: N/A;  . RADIOLOGY WITH ANESTHESIA N/A 08/25/2019   Procedure: STENTING;  Surgeon: Luanne Bras, MD;  Location: [place];  Service: Radiology;  Laterality: N/A;    Allergies: Patient has no known  allergies.  Medications: Prior to Admission medications   Medication Sig Start Date End Date Taking? Authorizing Provider  amLODipine (NORVASC) 10 MG tablet Take 1 tablet (10 mg total) by mouth daily. 05/12/19  Yes Minette Brine, FNP  aspirin EC 81 MG tablet Take 81 mg by mouth every evening.   Yes [provider]  Calcium Carbonate-Vitamin D (CALCIUM-D PO) Take 2 tablets by mouth daily at 12 noon.    Yes [provider]  carvedilol (COREG) 6.25 MG tablet TAKE 1 TABLET(6.25 MG) BY MOUTH TWICE DAILY Patient taking differently: Take 6.25 mg by mouth 2 (two) times daily with a meal.  07/31/19  Yes Minette Brine, FNP  hydrALAZINE (APRESOLINE) 50 MG tablet Take 1 tablet (50 mg total) by mouth 2 (two) times daily. 06/16/19 09/14/19 Yes Elouise Munroe, MD  Insulin Degludec-Liraglutide (XULTOPHY) 100-3.6 UNIT-MG/ML SOPN Inject 30 Units into the skin daily. 08/14/19  Yes Minette Brine, FNP  telmisartan-hydrochlorothiazide (MICARDIS HCT) 40-12.5 MG tablet Take 1 tablet by mouth daily. 07/31/19  Yes Minette Brine, FNP  ticagrelor (BRILINTA) 90 MG TABS tablet Take 1 tablet (90 mg total) by mouth 2 (two) times daily. 06/12/19  Yes Elouise Munroe, MD  vitamin C (ASCORBIC ACID) 250 MG tablet Take 500 mg by mouth daily at 12 noon.   Yes [provider]  blood glucose meter kit and supplies KIT Dispense  based on patient and insurance preference. Use up to four times daily as directed. (FOR ICD-9 250.00, 250.01). 05/10/18   Minette Brine, FNP  Blood Glucose Monitoring Suppl (TRUE METRIX METER) w/Device KIT 1 each by Does not apply route in the morning, at noon, in the evening, and at bedtime. 05/22/19   Minette Brine, FNP  Evolocumab (REPATHA SURECLICK) 410 MG/ML SOAJ Inject 140 mg into the skin every 14 (fourteen) days. 06/30/19   Elouise Munroe, MD  glucose blood (TRUE METRIX BLOOD GLUCOSE TEST) test strip Check blood sugar 4 times a day before meals and bedtime 05/22/19   Minette Brine, FNP   Insulin Glargine-Lixisenatide (SOLIQUA) 100-33 UNT-MCG/ML SOPN Inject 25 Units into the skin daily. Patient taking differently: Inject 30 Units into the skin daily.  07/14/19   Minette Brine, FNP  Multiple Vitamin (MULTIVITAMIN PO) Take 1 tablet by mouth daily at 12 noon.     [provider]  Omega-3 Fatty Acids (OMEGA-3 PLUS PO) Take 2 capsules by mouth daily at 12 noon.    [provider]     Family History  Problem Relation Age of Onset  . Stroke Mother   . Diabetes Father   . Diabetes Sister   . Breast cancer Neg Hx     Social History   Socioeconomic History  . Marital status: Legally Separated    Spouse name: Not on file  . Number of children: Not on file  . Years of education: Not on file  . Highest education level: Not on file  Occupational History  . Occupation: retired  Tobacco Use  . Smoking status: Never Smoker  . Smokeless tobacco: Never Used  Vaping Use  . Vaping Use: Never used  Substance and Sexual Activity  . Alcohol use: No  . Drug use: No  . Sexual activity: Not Currently  Other Topics Concern  . Not on file  Social History Narrative   Lives at home alone   Right handed   Social Determinants of Health   Financial Resource Strain:   . Difficulty of Paying Living Expenses:   Food Insecurity: No Food Insecurity  . Worried About Charity fundraiser in the Last Year: Never true  . Ran Out of Food in the Last Year: Never true  Transportation Needs: No Transportation Needs  . Lack of Transportation (Medical): No  . Lack of Transportation (Non-Medical): No  Physical Activity:   . Days of Exercise per Week:   . Minutes of Exercise per Session:   Stress:   . Feeling of Stress :   Social Connections:   . Frequency of Communication with Friends and Family:   . Frequency of Social Gatherings with Friends and Family:   . Attends Religious Services:   . Active Member of Clubs or Organizations:   . Attends Archivist Meetings:    Marland Kitchen Marital Status:     Review of Systems  Unable to perform ROS: Patient nonverbal    Vital Signs: BP (!) 168/79 (BP Location: Left Arm)   Pulse 103   Temp 98.6 F (37 C) (Axillary)   Resp 18   Ht _0  (1.549 m)   Wt 79.2 kg   SpO2 99%   BMI 32.99 kg/m   Physical Exam Vitals reviewed.  HENT:     Head: Normocephalic and atraumatic.  Eyes:     Extraocular Movements: Extraocular movements intact.  Neck:     Comments: Tracheostomy Cardiovascular:     Rate and  Rhythm: Normal rate and regular rhythm.  Pulmonary:     Effort: Pulmonary effort is normal. No respiratory distress.     Breath sounds: Normal breath sounds.  Abdominal:     General: There is no distension.     Palpations: Abdomen is soft.     Tenderness: There is no abdominal tenderness.  Skin:    General: Skin is warm and dry.  Neurological:     Mental Status: She is alert.     Comments: Alert, nods to yes/no questions appropriately, follows simple commands of right side.     Imaging: DG Chest 1 View  Result Date: 10/02/2019 CLINICAL DATA:  Hypoxemia EXAM: CHEST  1 VIEW COMPARISON:  09/22/2019 FINDINGS: Feeding catheter, tracheostomy tube and right-sided PICC line are again noted stable. Cardiac shadow is stable. Aortic calcifications are again seen. Previously seen lung opacities have improved over the interval from the prior exam. No sizable effusion is noted. IMPRESSION: Improved aeration bilaterally. Tubes and lines as described. Electronically Signed   By: Inez Catalina M.D.   On: 10/02/2019 11:37   CT ANGIO CHEST PE W OR WO CONTRAST  Result Date: 10/06/2019 CLINICAL DATA:  Respiratory distress. Evaluate for pulmonary embolism. Patient nonverbal. EXAM: CT ANGIOGRAPHY CHEST WITH CONTRAST TECHNIQUE: Multidetector CT imaging of the chest was performed using the standard protocol during bolus administration of intravenous contrast. Multiplanar CT image reconstructions and MIPs were obtained to evaluate the  vascular anatomy. CONTRAST:  53m OMNIPAQUE IOHEXOL 350 MG/ML SOLN COMPARISON:  Chest radiograph 10/06/2019.  Cardiac CT of 04/16/2019. FINDINGS: Cardiovascular: The quality of this exam for evaluation of pulmonary embolism is moderate. Limitations include mild motion, suboptimal bolus timing, patient arm position (not raised above the head). No pulmonary embolism to the lobar level. Right PICC line tip at low SVC. Aortic atherosclerosis. Bovine arch. Mild cardiomegaly and left ventricular hypertrophy. Lad coronary artery calcification. Mediastinum/Nodes: Enlarged, heterogeneous thyroid, likely due to multinodular goiter. No dominant mass. Endotracheal tube terminates well above the carina. Precarinal node measures 1.3 cm on 39/6. Prominent nodal tissue along the peribronchovascular interstitium in both infrahilar regions, likely reactive. Nasogastric tube is incompletely imaged but followed to the level of the stomach. Tiny hiatal hernia. Lungs/Pleura: Tiny right pleural effusion. Bilateral upper lobe predominant areas of ground-glass and nodular pulmonary opacities. Some areas of mild septal thickening. No well-defined lobar consolidation. No dominant lung mass. Example areas of nodularity including in the left upper lobe at 9 mm on 31/7 and the left lower lobe at 6 mm on 53/7. Upper Abdomen: Normal imaged portions of the liver, spleen, pancreas, gallbladder, right adrenal gland, kidneys. Left adrenal thickening. Musculoskeletal: Right hemidiaphragm elevation. No acute osseous abnormality. Review of the MIP images confirms the above findings. IMPRESSION: 1. Moderate quality evaluation for pulmonary embolism. No pulmonary embolism with limitations above. 2. Bilateral upper lobe predominant areas of ground-glass and nodular pulmonary opacities. Favor atypical infection. 3. Tiny right pleural effusion. 4. Coronary artery atherosclerosis. Mild thoracic adenopathy, favored to be reactive. 5. Tiny hiatal hernia. 6.  Aortic Atherosclerosis (ICD10-I70.0). Electronically Signed   By: KAbigail MiyamotoM.D.   On: 10/06/2019 14:42   MR BRAIN WO CONTRAST  Result Date: 09/09/2019 CLINICAL DATA:  69year old female status post right ICA angioplasty with scattered small infarcts mostly in the right hemisphere on 08/26/2019. Acute blood loss anemia. Subsequent encounter. EXAM: MRI HEAD WITHOUT CONTRAST TECHNIQUE: Multiplanar, multiecho pulse sequences of the brain and surrounding structures were obtained without intravenous contrast. COMPARISON:  Brain MRI 08/26/2019.  FINDINGS: Brain: Substantially increased size and confluence of multifocal restricted diffusion in the right cerebral hemisphere since 08/26/2019. Confluent involvement of the posterior right MCA and the superior right PCA territory white matter (series 5, image 84). This progression is relatively spared the cortex. And there is associated confluent T2 and FLAIR hyperintensity with evidence of petechial blood products (right occipital white matter series 14, image 31), but no malignant hemorrhagic transformation. There are also several new small foci of restricted diffusion elsewhere including the anterior left frontal lobe (also on image 84), left basal ganglia (series 5, image 78), right cerebellum, and brainstem including the left medullary pyramid on series 5, image 61. Superimposed chronic right basal ganglia infarct with hemosiderin and ex vacuo enlargement of the right lateral ventricle. Chronic left lateral cerebellar infarct is stable. No midline shift, mass effect, evidence of mass lesion, ventriculomegaly, extra-axial collection. Cervicomedullary junction and pituitary are within normal limits. Vascular: Major intracranial vascular flow voids are stable. Skull and upper cervical spine: Decreased T1 marrow signal in the skull and cervical spine might indicate red marrow reactivation. Sinuses/Orbits: Negative orbits. Paranasal sinus fluid and mucosal thickening not  significantly changed. Other: Mastoids remain well pneumatized. Small volume retained secretions now in the nasopharynx. IMPRESSION: 1. Progressed size and confluence of ischemia throughout much of the right hemisphere white matter since 08/26/2019. Associated petechial blood products but no malignant hemorrhagic transformation or mass effect. 2. There are also multiple new lacunar type infarcts elsewhere, including the brainstem and the left basal ganglia. 3. Underlying chronic infarcts of the right basal ganglia and left cerebellum. 4. Suspect interval red marrow reactivation in the skull and cervical spine. Electronically Signed   By: Genevie Ann M.D.   On: 09/09/2019 15:19   DG Chest Port 1 View  Result Date: 10/06/2019 CLINICAL DATA:  Shortness of breath EXAM: PORTABLE CHEST 1 VIEW COMPARISON:  10/05/2019 FINDINGS: Stable positioning of tracheostomy tube. Right-sided PICC line in terminating at the distal SVC. Enteric tube courses below the diaphragm, distal tip extending beyond the inferior margin of the film. Stable cardiomediastinal contours. Atherosclerotic calcification of the aortic knob. No new focal airspace consolidation. No pleural effusion or pneumothorax. IMPRESSION: No acute cardiopulmonary findings. Stable lines and tubes, as above. Electronically Signed   By: Davina Poke D.O.   On: 10/06/2019 11:08   DG Chest Port 1 View  Result Date: 10/05/2019 CLINICAL DATA:  Acute respiratory distress EXAM: PORTABLE CHEST 1 VIEW COMPARISON:  10/02/2019 FINDINGS: Tracheostomy, right upper extremity PICC line, and nasoenteric feeding tube extending into the upper abdomen beyond the margin of the examination are unchanged. Lungs are clear. No pneumothorax or pleural effusion. Cardiac size within normal limits. Pulmonary vascularity is normal. IMPRESSION: Stable examination. No focal pulmonary infiltrate identified. Preserved pulmonary insufflation. Electronically Signed   By: Fidela Salisbury MD   On:  10/05/2019 22:16   DG CHEST PORT 1 VIEW  Result Date: 09/22/2019 CLINICAL DATA:  PICC placement. EXAM: PORTABLE CHEST 1 VIEW COMPARISON:  September 16, 2019. FINDINGS: Stable cardiomediastinal silhouette. Tracheostomy and feeding tubes are unchanged in position. No pneumothorax or pleural effusion is noted. Stable bilateral lung opacities are noted concerning for possible inflammation. Right-sided PICC line is again noted with distal tip in expected position of the SVC. Bony thorax is unremarkable. IMPRESSION: Stable support apparatus. Stable bilateral lung opacities are noted concerning for possible inflammation. No pneumothorax is noted. Aortic Atherosclerosis (ICD10-I70.0). Electronically Signed   By: Marijo Conception M.D.   On: 09/22/2019 12:49  DG CHEST PORT 1 VIEW  Result Date: 09/16/2019 CLINICAL DATA:  Sepsis EXAM: PORTABLE CHEST 1 VIEW COMPARISON:  September 13, 2019 FINDINGS: Tracheostomy catheter tip is 4.9 cm above the carina. Feeding tube tip is below the diaphragm. Central catheter tip is in the superior vena cava. No pneumothorax. There is mild bibasilar atelectasis. The lungs elsewhere are clear. Heart is upper normal in size with pulmonary vascularity normal. No adenopathy. There is aortic atherosclerosis. No bone lesions. There is calcification in the carotid arteries. IMPRESSION: Tube and catheter positions as described without pneumothorax. Slight bibasilar atelectasis. No edema or airspace opacity. Stable cardiac silhouette. Aortic Atherosclerosis (ICD10-I70.0). There is also bilateral carotid artery calcification. Electronically Signed   By: Lowella Grip III M.D.   On: 09/16/2019 08:14   DG Chest Port 1 View  Result Date: 09/13/2019 CLINICAL DATA:  Status post tracheostomy placement. EXAM: PORTABLE CHEST 1 VIEW COMPARISON:  Single-view of the chest 09/08/2019. FINDINGS: Right PICC and feeding tube remain in place. New tracheostomy tube is seen with the tip in good position at the level the  clavicular heads. Support apparatus overlies the left upper chest but there appears to be airspace disease in the left upper lobe. Right lung clear. Heart size normal. Atherosclerosis. IMPRESSION: Tracheostomy tube in good position. Findings worrisome for left upper lobe pneumonia although support apparatus overlies the area of interest. Electronically Signed   By: Inge Rise M.D.   On: 09/13/2019 16:39   DG Swallowing Func-Speech Pathology  Result Date: 10/02/2019 Objective Swallowing Evaluation: Type of Study: Bedside Swallow Evaluation  Patient Details Name: THOMASA HEIDLER MRN: [id number] Date of Birth: January 26, 1951 Today's Date: 10/02/2019 Time: SLP Start Time (ACUTE ONLY): 3149 -SLP Stop Time (ACUTE ONLY): 1104 SLP Time Calculation (min) (ACUTE ONLY): 24 min Past Medical History: Past Medical History: Diagnosis Date . Anemia   low iron - not since having fibroids removed . Arthritis  . Hyperlipidemia  . Hypertension  . Sleep apnea   just recently diagnosed with it, has not gotten the Cpap (done 10/29/18) . Stroke [facility])   weakness on left side . Type II diabetes mellitus ([place])  Past Surgical History: Past Surgical History: Procedure Laterality Date . COLONOSCOPY   . IR ANGIO INTRA EXTRACRAN SEL COM CAROTID INNOMINATE BILAT MOD SED  10/18/2018 . IR ANGIO INTRA EXTRACRAN SEL COM CAROTID INNOMINATE BILAT MOD SED  08/18/2019 . IR ANGIO VERTEBRAL SEL SUBCLAVIAN INNOMINATE UNI R MOD SED  08/18/2019 . IR ANGIO VERTEBRAL SEL VERTEBRAL BILAT MOD SED  10/18/2018 . IR ANGIO VERTEBRAL SEL VERTEBRAL UNI L MOD SED  08/18/2019 . IR PTA INTRACRANIAL  08/25/2019 . IR US GUIDE VASC ACCESS RIGHT  10/18/2018 . IR US GUIDE VASC ACCESS RIGHT  08/18/2019 . IR US GUIDE VASC ACCESS RIGHT  08/25/2019 . MYOMECTOMY   . RADIOLOGY WITH ANESTHESIA N/A 08/18/2019  Procedure: STENTING;  Surgeon: Luanne Bras, MD;  Location: [place];  Service: Radiology;  Laterality: N/A; . RADIOLOGY WITH ANESTHESIA N/A 08/25/2019  Procedure: STENTING;  Surgeon:  Luanne Bras, MD;  Location: [place];  Service: Radiology;  Laterality: N/A; HPI: 69 y.o. female with PMH significant for HTN, HLD, CVA 06/2018, DM, anemia, obesity, OSA, and OA. Pt underwent R ICA angioplasty on 6/14. Pt with decline in neuro status and hemoglobin on 6/15, found to have a large R groin hematoma.  Pt has had Hbg 5.0 or less and is a Jehovah's witness. MRI showed scattered small acute infarcts along the right MCA/watershed territory. Solitary small  acute infarct in the left parietal cortex and right cerebellum. She was intubated on 6/19. Pt underwent tracheostomy on 7/3. PT found to be septic with RLL PNA on 7/6. As of 10/01/19, not tolerating PMSV, noted stridor and air stacking despite now being in [place] #6 uncuffed. 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  Subjective: pt alert, upright in MBS chair, agreeable to evaluation Assessment / Plan / Recommendation CHL IP CLINICAL IMPRESSIONS 10/02/2019 Clinical Impression Ms. Waxman demonstrates a mild pharyngeal dysphagia. She was noted to be fatigued after just having an x-ray completed and initially had high RR (32), which given time lowered to 18, more consistent with her baseline. Orally, all consistencies tested were WNL, however, pt deferred regular solid trials secondary to fatigue. Pharyngeally, pt was noted with reduced hyoid excursion, laryngeal elevation, and laryngeal vestibule closure, which resulted in trace deep penetration of thin liquids before the swallow. Oral prep phase was trialed as compensation, however, was ineffective. Pt was also unable to effectively utilize chin tuck strategy to prevent penetration. Given nectar thick liquids, she had no penetration or aspiration via cup or straw. Straw use may be preferred as pt requires full assist for feeding and straw allows her to control bolus size. She consumed purees with good pharyngeal clearance/adequate pharyngeal constriction. Esophageal phase could not be  viewed due to patient positioning and size. Recommend: nectar thick liquids via straw, purees (dysphagia 1), slow rate of feeding, meds crushed in puree, ongoing dysphagia treatment SLP Visit Diagnosis Dysphagia, unspecified (R13.10) Impact on safety and function Mild aspiration risk   CHL IP TREATMENT RECOMMENDATION 10/02/2019 Treatment Recommendations F/U FEES in --- days (Comment)   Prognosis 10/02/2019 Prognosis for Safe Diet Advancement Good Barriers to Reach Goals Cognitive deficits Barriers/Prognosis Comment -- CHL IP DIET RECOMMENDATION 10/02/2019 SLP Diet Recommendations Nectar thick liquid;Dysphagia 1 (Puree) solids Liquid Administration via Cup;Straw Medication Administration Crushed with puree Compensations Small sips/bites Postural Changes Remain semi-upright after after feeds/meals (Comment);Seated upright at 90 degrees   CHL IP OTHER RECOMMENDATIONS 10/02/2019 Recommended Consults Consider ENT evaluation Oral Care Recommendations Oral care QID Other Recommendations Order thickener from pharmacy   CHL IP FOLLOW UP RECOMMENDATIONS 10/02/2019 Follow up Recommendations Skilled Nursing facility;LTACH;24 hour supervision/assistance   CHL IP FREQUENCY AND DURATION 10/02/2019 Speech Therapy Frequency (ACUTE ONLY) min 2x/week Treatment Duration 2 weeks      CHL IP ORAL PHASE 10/02/2019 Oral Phase WFL  CHL IP PHARYNGEAL PHASE 10/02/2019 Pharyngeal Phase Impaired Pharyngeal- Nectar Teaspoon WFL Pharyngeal -- Pharyngeal- Nectar Cup WFL Pharyngeal -- Pharyngeal- Nectar Straw WFL Pharyngeal -- Pharyngeal- Thin Teaspoon Reduced epiglottic inversion;Reduced laryngeal elevation;Reduced airway/laryngeal closure;Penetration/Aspiration before swallow Pharyngeal -- Pharyngeal- Thin Cup Reduced epiglottic inversion;Reduced laryngeal elevation;Reduced anterior laryngeal mobility;Reduced airway/laryngeal closure;Penetration/Aspiration before swallow Pharyngeal -- Pharyngeal- Thin Straw Reduced epiglottic inversion;Reduced anterior  laryngeal mobility;Reduced laryngeal elevation;Reduced airway/laryngeal closure;Penetration/Aspiration before swallow Pharyngeal -- Pharyngeal- Puree WFL  CHL IP CERVICAL ESOPHAGEAL PHASE 10/02/2019 Cervical Esophageal Phase [facility] P. Isenhour, M.S., CCC-SLP Speech-Language Pathologist Acute Rehabilitation Services Pager: [place] 10/02/2019, 11:32 AM               Labs:  CBC: Recent Labs    09/20/19 0500 09/26/19 0257 10/07/19 0617 10/09/19 0731  WBC 10.4 11.3* 7.0 4.8  HGB 4.7* 6.8* 6.6* 7.6*  HCT 17.5* 22.5* 23.5* 27.4*  PLT 447* 247 262 274    COAGS: Recent Labs    11/27/18 0728 08/18/19 0705 08/26/19 1715 08/27/19 0230  INR 0.9 0.9 1.1 1.2  BMP: Recent Labs    09/21/19 0500 09/26/19 0537 10/04/19 0500 10/06/19 1155  NA 142 141 140 143  K 3.3* 3.8 4.0 3.8  CL 103 106 100 103  CO2 30 30 32 29  GLUCOSE 145* 141* 260* 223*  BUN 41* _0 CALCIUM 8.6* 8.7* 9.0 9.2  CREATININE 0.81 0.58 0.69 0.71  GFRNONAA >60 >60 >60 >60  GFRAA >60 >60 >60 >60    LIVER FUNCTION TESTS: Recent Labs    09/17/19 0703 09/17/19 0703 09/20/19 0500 09/21/19 0500 09/26/19 0537 10/06/19 1155  BILITOT 1.1  --  0.7 0.3 0.5  --   AST 32  --  _1 --   ALT 23  --  29 36 42  --   ALKPHOS 82  --  95 81 57  --   PROT 6.6  --  7.4 7.1 5.5*  --   ALBUMIN 1.6*   < > 1.5* 1.6* 1.8* 2.5*   < > = values in this interval not displayed.    TUMOR MARKERS: No results for input(s): AFPTM, CEA, CA199, CHROMGRNA in the last 8760 hours.  Assessment and Plan:  Dysphagia after acute CVA.  Will proceed with image guided placement of gastrostomy tube when son agreeable.  Risks and benefits image guided gastrostomy tube placement was discussed with the patient's son including, but not limited to the need for a barium enema during the procedure, bleeding (minimal), infection, peritonitis and/or damage to adjacent structures.  I explained this procedure is  fairly low risk with minimal blood loss potential  All questions were answered.  Consent on hold until son, [name], speaks with Dr. Pietro Cassis.  Thank you for this interesting consult.  I greatly enjoyed meeting [employer] and look forward to participating in their care.  A copy of this report was sent to the requesting provider on this date.  Electronically Signed: Murrell Redden, PA-C   10/09/2019, 9:01 AM      I spent a total of   25 Minutes in face to face in clinical consultation, greater than 50% of which was counseling/coordinating care for gastrostomy tube placement.

## 2019-10-09 NOTE — Progress Notes (Signed)
Physical Therapy Treatment Patient Details Name: Donna [name] MRN: [id number] DOB: Aug 01, 1950 Today's Date: 10/09/2019    History of Present Illness 69 y.o. female with PMH significant for HTN, HLD, CVA 06/2018, DM, anemia, obesity, OSA, and OA. She is known to [facility] and has been followed by Dr. Estanislado Pandy since 10/2018. Pt underwent R ICA angioplasty on 6/14. Pt with decline in neuro status and hemoglobin on 6/15, found to have a large R groin hematoma.  Pt has had Hbg 5.0 or less and is a Jehovah's witness.  She was intubated on 6/19. Pt underwent tracheostomy on 7/3. PT found to be septic with RLL PNA on 7/6. 7/13 trach tolerating extubation. Patient had a significant event on 7/25 resulting in respiratory distress, oxygen desaturation, and retroperitoneal hematomas in the right iliac/inguinal regions.    PT Comments    Patient received in bed, smiling and waving when PT arrived and willing to participate in session today. Focus on bed level activities and NMR of L hemiparetic side including ankle pumps, SAQs, supine hip abduction, heel slides, and shoulder flexion; Mod-max verbal and tactile cues provided for AAROM of L UE/LE. Noted occasional trace muscle contraction but not consistent in hemiparetic side. Also worked on rolling side to side with good mechanics and cues for activation of core musculature. Left positioned to comfort in bed with all needs met, alarm active, and family present.    Follow Up Recommendations  SNF     Equipment Recommendations  Wheelchair (measurements PT);Wheelchair cushion (measurements PT);Hospital bed    Recommendations for Other Services       Precautions / Restrictions Precautions Precautions: Fall Precaution Comments: trach, low hbg Required Braces or Orthoses: Other Brace Other Brace: L resting hand splint, prevalon boots Restrictions Weight Bearing Restrictions: No    Mobility  Bed Mobility               General bed mobility comments:  focus on bed level activities  Transfers                 General transfer comment: focus on bed level activities  Ambulation/Gait             General Gait Details: focus on bed level activities   Stairs             Wheelchair Mobility    Modified Rankin (Stroke Patients Only)       Balance                                            Cognition Arousal/Alertness: Awake/alert Behavior During Therapy: Flat affect Overall Cognitive Status: Difficult to assess Area of Impairment: Attention;Following commands;Problem solving                   Current Attention Level: Sustained   Following Commands: Follows one step commands with increased time;Follows one step commands inconsistently Safety/Judgement: Decreased awareness of safety;Decreased awareness of deficits Awareness: Intellectual Problem Solving: Slow processing;Requires verbal cues;Decreased initiation;Difficulty sequencing;Requires tactile cues General Comments: inconsistent answers to cues and questions, inconsistent cue following- perhaps 60% of session. Often communicates with looks and facial expressions- or tries to, she is very flat and can be hard to communicate with      Exercises      General Comments General comments (skin integrity, edema, etc.): focus on bed level activities today  Pertinent Vitals/Pain Pain Assessment: No/denies pain Pain Score: 0-No pain Faces Pain Scale: No hurt Pain Intervention(s): Limited activity within patient's tolerance;Monitored during session    Home Living                      Prior Function            PT Goals (current goals can now be found in the care plan section) Acute Rehab PT Goals Patient Stated Goal: none stated PT Goal Formulation: Patient unable to participate in goal setting Time For Goal Achievement: 10/23/19 Potential to Achieve Goals: Fair Progress towards PT goals: Progressing toward goals  (slowly)    Frequency    Min 3X/week      PT Plan Current plan remains appropriate    Co-evaluation              AM-PAC PT "6 Clicks" Mobility   Outcome Measure  Help needed turning from your back to your side while in a flat bed without using bedrails?: Total Help needed moving from lying on your back to sitting on the side of a flat bed without using bedrails?: Total Help needed moving to and from a bed to a chair (including a wheelchair)?: Total Help needed standing up from a chair using your arms (e.g., wheelchair or bedside chair)?: Total Help needed to walk in hospital room?: Total Help needed climbing 3-5 steps with a railing? : Total 6 Click Score: 6    End of Session Equipment Utilized During Treatment: Oxygen (trach) Activity Tolerance: Patient tolerated treatment well Patient left: in bed;with SCD's reapplied;with call bell/phone within reach;with bed alarm set;with family/visitor present   PT Visit Diagnosis: Other abnormalities of gait and mobility (R26.89);Muscle weakness (generalized) (M62.81);Other symptoms and signs involving the nervous system (R29.898);Hemiplegia and hemiparesis Hemiplegia - Right/Left: Left Hemiplegia - dominant/non-dominant: Non-dominant Hemiplegia - caused by: Cerebral infarction     Time: 3672-5500 PT Time Calculation (min) (ACUTE ONLY): 35 min  Charges:  $Therapeutic Exercise: 23-37 mins                     Windell Norfolk, DPT, PN1   Supplemental Physical Therapist [place]    Pager [phone] Acute Rehab Office [phone]

## 2019-10-09 NOTE — Progress Notes (Addendum)
Chest xray results back. Dahal MD cleared RN to reattach pt to PICC for fluids.  Upon going to pt's room to do so, RN found pt had pulled cortrak halfway out. RN reported findings to Dahal MD. He gave order to remove it and reported pt is to have PEG tube placed tomorrow.   RN had rapid response nurse assess pt. Pt was deep suctioned. Lung sounds currently diminished.   RN asked about pt's overnight medications. Dahal MD gave verbal order to hold all pills with cortrak gone. He reported that if hydralazine needed RN to use PRN 10 mg order.    RN reported orders/findings to [employer] (night shift).

## 2019-10-09 NOTE — Progress Notes (Signed)
PROGRESS NOTE  Donna [name]  DOB: 12-19-1950  PCP: Minette Brine, [place] MAU:[id number]  DOA: 08/25/2019  LOS: 45 days   No chief complaint on file.  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  6/14, patient was admitted by Neuro IR and underwent cerebral angiogram s/p RT ICA cervical/ petrous junction balloon angioplasty for severe stenosis via right femoral approach. Postprocedure, patient was placed on ASA/ brillinta and monitored in Neuro ICU.  6/15, noted hypotension, increased left sided weakness, lethargy and left-sided neglect. Hgb drop from 13.3 to 6.9 with new AKI. Post MRI/ MRA showed scattered small acute infarcts along the right MCA/ watershed territory suspected to be resultant either post procedure vs hypoperfusion. Continues to have left-sided neglect. 6/15, CT abd/ pelvis was obtained given concern for retroperitoneal bleed which showed a mild to moderate amount of retroperitoneal hematoma in the right iliac and inguinal regiones with moderate size hematoma noted the the soft tissues anterior to the musculature of the right hip; no definite intrapertioneal hemorrhage. INR, PT, and platelets rechecked and were wnl. Vascular surgery was consulted.  No intervention done by vascular given bleeding was mostly into the muscle and tissue.  Patient has had long hospitalization since then.  6/18 -intubated, reintubated 6/23 and 6/28 for failed trial of extubation. 7/3 -underwent tracheostomy 7/25-7/26, patient developed respiratory distress with significant desaturation. CXR did not show any acute finding. Tube feed was kept on held given concern for aspiration. Tracheal aspiration was performed.  Received high-dose Solu-Medrol and Lasix.  PCCM consulted.   CTA chest negative for PE but showed GGO and nodular opacities in bilateral upper lobes.   Subjective: Patient was seen and examined this morning.  Elderly thin built African-American female lying on bed. Alert, awake, able to follow motor commands to move right extremities.  1/5 strength on left extremities. Currently has tube feeding going through [place].  Assessment/Plan: Acute on chronic respiratory failure with hypoxia:  Aspiration pneumonia. -Currently on trach collar at 5 L/min.  -7/25-7/26, patient developed respiratory distress with significant desaturation. CXR did not show any acute finding. Tube feed was kept on held given concern for aspiration. Tracheal aspiration was performed.  Received high-dose Solu-Medrol and Lasix.  PCCM consulted.  CTA chest negative for PE but showed GGO and nodular opacities in bilateral upper lobes.  -IV Solu-Medrol 125 mg once on 7/26. -Started on Pulmicort and DuoNeb -Currently not on antibiotics. -Appreciate PCCM input. -Keep feeds running at a lower rate because of risk of aspiration. 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  -No anticoagulation or antiplatelet given bleed as above -Final disposition likely SNF if stable -Neurology signed off.  Acutemetabolicencephalopathy -Improved. Responds to questions by nodding and follows motor command. -cotninue seroquel at 166m QHS (has helped come off precedex) -monitor QTc intermittently.  Dysphagia:  -Likely due to CVA. -On tube feed via core track. Patient will benefit from PEG tube placement.  Discussed with patient's son Mr. Donna Slocumbthis morning.  We discussed about the risk and benefit of getting a PEG tube placed.  Patient's son is willing to proceed.  Informed radiology team. -Aspiration  precaution  Volume Overload Essential hypertension -Appears euvolemic on exam.  Intermittently received  IV Lasix. -Goal to keep SBP<160 given intracranial stenosis. -Currently on amlodipine 10 mg daily. -Continue to monitor blood pressure  Acute blood loss anemia  Anemia of critical illness:  -Jehovah's Witness, no blood products including albumin. -Continue severe anemia protocol: Iron, Folic acid, vitamin X32, Monitor off Epogen and Vit C that was previously given -Intermittently monitor H&H.  Hemoglobin this morning is better at 7.6, 6.6 on 7/27. -Avoid blood draws if possible  Diarrhea-resolved -most likely due to tube feedings -Continue to monitor  Uncontrolled diabetes with hyperglycemia Lab Results  Component Value Date   HGBA1C 5.2 10/03/2019   -Currently blood sugar trending less than 200 on Levemir 12 units and sliding scale insulin.  Continue the same with Accu-Cheks.  Debility/physical deconditioning -Continue PT/OT when medically stable.  Mobility: PT evaluation Code Status:   Code Status: Full Code  Nutritional status: Body mass index is 32.99 kg/m. Nutrition Problem: Inadequate oral intake Etiology: inability to eat Signs/Symptoms: NPO status Diet Order            Diet NPO time specified  Diet effective now                 DVT prophylaxis: Place and maintain sequential compression device Start: 09/26/19 1319   Antimicrobials:  Currently on none Fluid: None  Consultants: PCCM, neuro IR, IR, Family Communication:  None at bedside  Status is: Inpatient  Remains inpatient appropriate because:Ongoing diagnostic testing needed not appropriate for outpatient work up and IV treatments appropriate due to intensity of illness or inability to take PO   Dispo: The patient is from: Home              Anticipated d/c is to: SNF              Anticipated d/c date is: 3 days              Patient currently is not medically stable to d/c.  Pending PEG  tube placement.    Infusions:  . sodium chloride 10 mL/hr at 09/25/19 1800  . dextrose 5 % and 0.45% NaCl 50 mL/hr at 10/09/19 0901  . feeding supplement (JEVITY 1.2 CAL) 1,000 mL (10/09/19 0130)    Scheduled Meds: . amLODipine  10 mg Per Tube Daily  . atorvastatin  40 mg Per NG tube Daily  . budesonide (PULMICORT) nebulizer solution  0.5 mg Nebulization BID  . chlorhexidine gluconate (MEDLINE KIT)  15 mL Mouth Rinse BID  . Chlorhexidine Gluconate Cloth  6 each Topical Daily  . famotidine  20 mg Per Tube BID  . feeding supplement (PROSource TF)  45 mL Per Tube TID  . ferrous sulfate  220 mg Per Tube TID WC  . hydrALAZINE  100 mg Per Tube Q6H  . hydrocerin   Topical TID  . insulin aspart  0-15 Units Subcutaneous Q4H  . insulin aspart  4 Units Subcutaneous Q4H  . insulin glargine  12 Units Subcutaneous Daily  . ipratropium-albuterol  3 mL Nebulization Q4H  . mouth rinse  15 mL Mouth Rinse 10 times per day  . QUEtiapine  100 mg Per Tube QHS  . sodium chloride flush  10-40 mL Intracatheter Q12H    Antimicrobials: Anti-infectives (From admission, onward)   Start     Dose/Rate Route Frequency Ordered Stop   09/16/19 2130  vancomycin (VANCOCIN) IVPB 1000 mg/200 mL premix  Status:  Discontinued        1,000 mg 200 mL/hr over 60 Minutes Intravenous Every  12 hours 09/16/19 0839 09/18/19 0756   09/16/19 1630  piperacillin-tazobactam (ZOSYN) IVPB 3.375 g        3.375 g 12.5 mL/hr over 240 Minutes Intravenous Every 8 hours 09/16/19 0835 09/23/19 1153   09/16/19 1000  ceFEPIme (MAXIPIME) 1 g in sodium chloride 0.9 % 100 mL IVPB  Status:  Discontinued        1 g 200 mL/hr over 30 Minutes Intravenous Every 12 hours 09/16/19 0756 09/16/19 0803   09/16/19 0845  piperacillin-tazobactam (ZOSYN) IVPB 3.375 g        3.375 g 100 mL/hr over 30 Minutes Intravenous  Once 09/16/19 0834 09/16/19 1007   09/16/19 0845  vancomycin (VANCOREADY) IVPB 1500 mg/300 mL        1,500 mg 150 mL/hr over 120  Minutes Intravenous  Once 09/16/19 0839 09/16/19 1248   09/16/19 0830  piperacillin-tazobactam (ZOSYN) IVPB 3.375 g  Status:  Discontinued        3.375 g 12.5 mL/hr over 240 Minutes Intravenous Every 8 hours 09/16/19 0803 09/16/19 0834   09/16/19 0800  vancomycin (VANCOCIN) IVPB 1000 mg/200 mL premix  Status:  Discontinued        1,000 mg 200 mL/hr over 60 Minutes Intravenous Every 12 hours 09/16/19 0756 09/16/19 0839   08/31/19 1400  Ampicillin-Sulbactam (UNASYN) 3 g in sodium chloride 0.9 % 100 mL IVPB  Status:  Discontinued        3 g 200 mL/hr over 30 Minutes Intravenous Every 6 hours 08/31/19 1357 09/02/19 0819   08/25/19 0723  ceFAZolin (ANCEF) IVPB 2g/100 mL premix        2 g 200 mL/hr over 30 Minutes Intravenous 60 min pre-op 08/25/19 0723 08/25/19 0930      PRN meds: acetaminophen **OR** acetaminophen (TYLENOL) oral liquid 160 mg/5 mL **OR** acetaminophen, albuterol, fentaNYL (SUBLIMAZE) injection, hydrALAZINE, labetalol, lip balm, oxyCODONE, sodium chloride flush   Objective: Vitals:   10/09/19 0734 10/09/19 0757  BP: (!) 140/64 (!) 168/79  Pulse: 80 103  Resp: 15 18  Temp:  98.6 F (37 C)  SpO2: 100% 99%    Intake/Output Summary (Last 24 hours) at 10/09/2019 1032 Last data filed at 10/09/2019 0600 Gross per 24 hour  Intake 1950 ml  Output 3250 ml  Net -1300 ml   Filed Weights   10/06/19 0400 10/07/19 0300 10/08/19 0500  Weight: 78.9 kg 78.5 kg 79.2 kg   Weight change:  Body mass index is 32.99 kg/m.   Physical Exam: General exam: Appears calm and comfortable.  Not in acute physical distress Skin: No rashes, lesions or ulcers. HEENT: Has a tracheostomy tube anteriorly, core track to nose Lungs: Diminished air entry on both bases, no crackles or wheezing CVS: Regular rate and rhythm, no murmur GI/Abd soft, nontender, nondistended, bowel sound present CNS: Alert, awake, able to follow motor commands to move right side Psychiatry: Mood and affect  normal Extremities: No pedal edema, no calf tenderness.  Legs on boots to prevent pressure ulcers.  Data Review: I have personally reviewed the laboratory data and studies available.  Recent Labs  Lab 10/07/19 0617 10/09/19 0731  WBC 7.0 4.8  HGB 6.6* 7.6*  HCT 23.5* 27.4*  MCV 101.7* 103.0*  PLT 262 274   Recent Labs  Lab 10/04/19 0500 10/06/19 1155  NA 140 143  K 4.0 3.8  CL 100 103  CO2 32 29  GLUCOSE 260* 223*  BUN 20 16  CREATININE 0.69 0.71  CALCIUM 9.0 9.2  MG  2.0  --   PHOS  --  3.4   Lab Results  Component Value Date   HGBA1C 5.2 10/03/2019       Component Value Date/Time   CHOL 217 (H) 05/22/2019 1312   TRIG 114 05/22/2019 1312   HDL 65 05/22/2019 1312   CHOLHDL 3.3 05/22/2019 1312   LDLCALC 132 (H) 05/22/2019 1312   LABVLDL 20 05/22/2019 1312    Signed, Terrilee Croak, MD [facility] Pager: [phone] (Secure Chat preferred). 10/09/2019

## 2019-10-09 NOTE — Progress Notes (Signed)
RN attempted to complete PICC line dressing change. When RN went to retrieve statlock from staff outside door and returned pt had bent & extended arm. PICC lock appeared to be further out than before. Finding reported to Dahal MD and stat chest xray order placed.  In mean time, RN completed sterile procedure with a PICC line dressing. RN will hold maintenance fluids until placement checked. RN will continue to monitor pt.

## 2019-10-10 ENCOUNTER — Inpatient Hospital Stay (HOSPITAL_COMMUNITY): Payer: Medicare HMO

## 2019-10-10 DIAGNOSIS — I6521 Occlusion and stenosis of right carotid artery: Secondary | ICD-10-CM | POA: Diagnosis not present

## 2019-10-10 HISTORY — PX: IR GASTROSTOMY TUBE MOD SED: IMG625

## 2019-10-10 LAB — CBC WITH DIFFERENTIAL/PLATELET
Abs Immature Granulocytes: 0.03 10*3/uL (ref 0.00–0.07)
Basophils Absolute: 0 10*3/uL (ref 0.0–0.1)
Basophils Relative: 0 %
Eosinophils Absolute: 0.6 10*3/uL — ABNORMAL HIGH (ref 0.0–0.5)
Eosinophils Relative: 12 %
HCT: 29 % — ABNORMAL LOW (ref 36.0–46.0)
Hemoglobin: 8.4 g/dL — ABNORMAL LOW (ref 12.0–15.0)
Immature Granulocytes: 1 %
Lymphocytes Relative: 13 %
Lymphs Abs: 0.7 10*3/uL (ref 0.7–4.0)
MCH: 28.9 pg (ref 26.0–34.0)
MCHC: 29 g/dL — ABNORMAL LOW (ref 30.0–36.0)
MCV: 99.7 fL (ref 80.0–100.0)
Monocytes Absolute: 0.4 10*3/uL (ref 0.1–1.0)
Monocytes Relative: 8 %
Neutro Abs: 3.4 10*3/uL (ref 1.7–7.7)
Neutrophils Relative %: 66 %
Platelets: 307 10*3/uL (ref 150–400)
RBC: 2.91 MIL/uL — ABNORMAL LOW (ref 3.87–5.11)
RDW: 18 % — ABNORMAL HIGH (ref 11.5–15.5)
WBC: 5.2 10*3/uL (ref 4.0–10.5)
nRBC: 0.4 % — ABNORMAL HIGH (ref 0.0–0.2)

## 2019-10-10 LAB — BASIC METABOLIC PANEL
Anion gap: 8 (ref 5–15)
BUN: <5 mg/dL — ABNORMAL LOW (ref 8–23)
CO2: 29 mmol/L (ref 22–32)
Calcium: 9.1 mg/dL (ref 8.9–10.3)
Chloride: 104 mmol/L (ref 98–111)
Creatinine, Ser: 0.55 mg/dL (ref 0.44–1.00)
GFR calc Af Amer: >60 mL/min (ref >60)
GFR calc non Af Amer: >60 mL/min (ref >60)
Glucose, Bld: 145 mg/dL — ABNORMAL HIGH (ref 70–99)
Potassium: 3.3 mmol/L — ABNORMAL LOW (ref 3.5–5.1)
Sodium: 141 mmol/L (ref 135–145)

## 2019-10-10 LAB — CULTURE, RESPIRATORY W GRAM STAIN

## 2019-10-10 LAB — GLUCOSE, CAPILLARY
Glucose-Capillary: 113 mg/dL — ABNORMAL HIGH (ref 70–99)
Glucose-Capillary: 146 mg/dL — ABNORMAL HIGH (ref 70–99)
Glucose-Capillary: 146 mg/dL — ABNORMAL HIGH (ref 70–99)
Glucose-Capillary: 155 mg/dL — ABNORMAL HIGH (ref 70–99)
Glucose-Capillary: 183 mg/dL — ABNORMAL HIGH (ref 70–99)
Glucose-Capillary: 202 mg/dL — ABNORMAL HIGH (ref 70–99)

## 2019-10-10 LAB — PHOSPHORUS: Phosphorus: 2.9 mg/dL (ref 2.5–4.6)

## 2019-10-10 LAB — MAGNESIUM: Magnesium: 1.8 mg/dL (ref 1.7–2.4)

## 2019-10-10 MED ORDER — FENTANYL CITRATE (PF) 100 MCG/2ML IJ SOLN
INTRAMUSCULAR | Status: AC
  Filled 2019-10-10: qty 2

## 2019-10-10 MED ORDER — JEVITY 1.2 CAL PO LIQD
1000.0000 mL | ORAL | Status: DC
  Administered 2019-10-11 – 2019-10-19 (×6): 1000 mL
  Filled 2019-10-10 (×13): qty 1000

## 2019-10-10 MED ORDER — GLUCAGON HCL RDNA (DIAGNOSTIC) 1 MG IJ SOLR
INTRAMUSCULAR | Status: AC
  Filled 2019-10-10: qty 1

## 2019-10-10 MED ORDER — CEFAZOLIN SODIUM-DEXTROSE 2-4 GM/100ML-% IV SOLN
INTRAVENOUS | Status: AC
  Administered 2019-10-10: 2 g
  Filled 2019-10-10: qty 100

## 2019-10-10 MED ORDER — MIDAZOLAM HCL 2 MG/2ML IJ SOLN
INTRAMUSCULAR | Status: AC
  Filled 2019-10-10: qty 2

## 2019-10-10 MED ORDER — MIDAZOLAM HCL 2 MG/2ML IJ SOLN
INTRAMUSCULAR | Status: AC
  Administered 2019-10-10 (×2): 0.5 mg
  Administered 2019-10-10: 1 mg
  Filled 2019-10-10: qty 2

## 2019-10-10 MED ORDER — LIDOCAINE HCL 1 % IJ SOLN
INTRAMUSCULAR | Status: AC
  Filled 2019-10-10: qty 20

## 2019-10-10 MED ORDER — IOHEXOL 300 MG/ML  SOLN: 50.0000 mL | Freq: Once | INTRAMUSCULAR | Status: DC | PRN

## 2019-10-10 MED ORDER — FENTANYL CITRATE (PF) 100 MCG/2ML IJ SOLN
100.0000 ug | Freq: Once | INTRAMUSCULAR | Status: AC
  Administered 2019-10-10 (×2): 50 ug via INTRAVENOUS
  Administered 2019-10-10: 25 ug via INTRAVENOUS

## 2019-10-10 MED ORDER — CEFAZOLIN SODIUM-DEXTROSE 2-4 GM/100ML-% IV SOLN
INTRAVENOUS | Status: AC
  Filled 2019-10-10: qty 100

## 2019-10-10 MED ORDER — GLUCAGON HCL RDNA (DIAGNOSTIC) 1 MG IJ SOLR
INTRAMUSCULAR | Status: AC
  Administered 2019-10-10: 1 mg via INTRAVENOUS
  Filled 2019-10-10: qty 1

## 2019-10-10 MED ORDER — LIDOCAINE HCL 1 % IJ SOLN
INTRAMUSCULAR | Status: AC
  Administered 2019-10-10: 5 mL
  Filled 2019-10-10: qty 20

## 2019-10-10 NOTE — Progress Notes (Signed)
PROGRESS NOTE  Donna [name]  DOB: 1950-03-31  PCP: Minette Brine, [place] JIR:[id number]  DOA: 08/25/2019  LOS: 46 days   No chief complaint on file.  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  6/14, patient was admitted by Neuro IR and underwent cerebral angiogram s/p RT ICA cervical/ petrous junction balloon angioplasty for severe stenosis via right femoral approach. Postprocedure, patient was placed on ASA/ brillinta and monitored in Neuro ICU.  6/15, noted hypotension, increased left sided weakness, lethargy and left-sided neglect. Hgb drop from 13.3 to 6.9 with new AKI. Post MRI/ MRA showed scattered small acute infarcts along the right MCA/ watershed territory suspected to be resultant either post procedure vs hypoperfusion. Continues to have left-sided neglect. 6/15, CT abd/ pelvis was obtained given concern for retroperitoneal bleed which showed a mild to moderate amount of retroperitoneal hematoma in the right iliac and inguinal regiones with moderate size hematoma noted the the soft tissues anterior to the musculature of the right hip; no definite intrapertioneal hemorrhage. INR, PT, and platelets rechecked and were wnl. Vascular surgery was consulted.  No intervention done by vascular given bleeding was mostly into the muscle and tissue.  Patient has had long hospitalization since then.  6/18 -intubated, reintubated 6/23 and 6/28 for failed trial of extubation. 7/3 -underwent tracheostomy 7/25-7/26, patient developed respiratory distress with significant desaturation. CXR did not show any acute finding. Tube feed was kept on held given concern for aspiration. Tracheal aspiration was performed.  Received high-dose Solu-Medrol and Lasix.  PCCM consulted.   CTA chest negative for PE but showed GGO and nodular opacities in bilateral upper lobes.   Subjective: Patient was seen and examined this morning.   Not in distress.  Acknowledges my presence.  Able to follow commands to move right extremities.   Patient pulled out her core track yesterday.  Tentative plan to put a PEG tube in today.  IR has been reaching out to family to get a consent.    Assessment/Plan: Acute on chronic respiratory failure with hypoxia:  Aspiration pneumonia. -Currently on trach collar at 5 L/min.  -7/25-7/26, patient developed respiratory distress with significant desaturation. CXR did not show any acute finding. Tube feed was kept on held given concern for aspiration. Tracheal aspiration was performed.  Received high-dose Solu-Medrol and Lasix.  PCCM consulted.  CTA chest negative for PE but showed GGO and nodular opacities in bilateral upper lobes.  -Respiratory status improved.  Continue Pulmicort and DuoNeb.  Not on antibiotics. -Appreciate PCCM input. 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  -No anticoagulation or antiplatelet given bleed as above -Final disposition likely SNF if stable -Neurology signed off.  Acutemetabolicencephalopathy -Improved. Responds to questions by nodding and follows motor command. -cotninue seroquel at 153m QHS (has helped come off precedex) -monitor QTc intermittently.  Dysphagia:  -due to CVA. -Patient pulled out her core track yesterday.  Tentative plan to put a PEG tube in today.  IR has been reaching out to family to get a consent.  If unable to get PEG tube placed today, it may end up getting pushed till Monday.  Wondering if another core track can be placed in the meantime.  Discussed with  IR. -Aspiration precaution  Volume  Overload Essential hypertension -Appears euvolemic on exam.  Intermittently received IV Lasix. -Goal to keep SBP<160 given intracranial stenosis. -Currently on amlodipine 10 mg daily.  Oral meds on hold this morning pending PEG tube placement. -Continue to monitor blood pressure  Acute blood loss anemia  Anemia of critical illness:  -Jehovah's Witness, no blood products including albumin. -Continue severe anemia protocol: Iron, Folic acid, vitamin X93, Monitor off Epogen and Vit C that was previously given -Intermittently monitor H&H.  Hemoglobin this morning is 8.4. -Avoid blood draws if possible  Diarrhea-resolved -most likely due to tube feedings -Continue to monitor  Hypokalemia -Potassium this morning 3.3.  1 dose of 40 mEq kC replacement through tube once PEG tube is in place.  Uncontrolled diabetes with hyperglycemia Lab Results  Component Value Date   HGBA1C 5.2 10/03/2019   -Currently blood sugar trending less than 200 on Levemir 12 units and sliding scale insulin.  Continue the same with Accu-Cheks.  Debility/physical deconditioning -Continue PT/OT when medically stable.  Mobility: PT evaluation Code Status:   Code Status: Full Code  Nutritional status: Body mass index is 32.99 kg/m. Nutrition Problem: Inadequate oral intake Etiology: inability to eat Signs/Symptoms: NPO status Diet Order            Diet NPO time specified  Diet effective now                 DVT prophylaxis: Place and maintain sequential compression device Start: 09/26/19 1319   Antimicrobials:  Currently on none Fluid: None  Consultants: PCCM, neuro IR, IR, Family Communication:  None at bedside  Status is: Inpatient  Remains inpatient appropriate because:Ongoing diagnostic testing needed not appropriate for outpatient work up and IV treatments appropriate due to intensity of illness or inability to take PO   Dispo: The patient is from: Home              Anticipated d/c  is to: SNF              Anticipated d/c date is: 3 days              Patient currently is not medically stable to d/c.  Pending PEG tube placement.    Infusions:  . sodium chloride 10 mL/hr at 09/25/19 1800  . ceFAZolin    . dextrose 5 % and 0.45% NaCl 50 mL/hr at 10/10/19 0358  . feeding supplement (JEVITY 1.2 CAL) Stopped (10/09/19 2006)    Scheduled Meds: . amLODipine  10 mg Per Tube Daily  . atorvastatin  40 mg Per NG tube Daily  . budesonide (PULMICORT) nebulizer solution  0.5 mg Nebulization BID  . chlorhexidine gluconate (MEDLINE KIT)  15 mL Mouth Rinse BID  . Chlorhexidine Gluconate Cloth  6 each Topical Daily  . famotidine  20 mg Per Tube BID  . feeding supplement (PROSource TF)  45 mL Per Tube TID  . fentaNYL      . ferrous sulfate  220 mg Per Tube TID WC  . glucagon (human recombinant)      . hydrALAZINE  100 mg Per Tube Q6H  . hydrocerin   Topical TID  . insulin aspart  0-15 Units Subcutaneous Q4H  . insulin aspart  4 Units Subcutaneous Q4H  . insulin glargine  12 Units Subcutaneous Daily  . ipratropium-albuterol  3 mL Nebulization TID  . mouth rinse  15 mL Mouth Rinse 10 times per day  . midazolam      .  QUEtiapine  100 mg Per Tube QHS  . sodium chloride flush  10-40 mL Intracatheter Q12H    Antimicrobials: Anti-infectives (From admission, onward)   Start     Dose/Rate Route Frequency Ordered Stop   10/10/19 0830  ceFAZolin (ANCEF) 2-4 GM/100ML-% IVPB       Note to Pharmacy: Domenick Bookbinder   : cabinet override      10/10/19 0830 10/10/19 2044   09/16/19 2130  vancomycin (VANCOCIN) IVPB 1000 mg/200 mL premix  Status:  Discontinued        1,000 mg 200 mL/hr over 60 Minutes Intravenous Every 12 hours 09/16/19 0839 09/18/19 0756   09/16/19 1630  piperacillin-tazobactam (ZOSYN) IVPB 3.375 g        3.375 g 12.5 mL/hr over 240 Minutes Intravenous Every 8 hours 09/16/19 0835 09/23/19 1153   09/16/19 1000  ceFEPIme (MAXIPIME) 1 g in sodium chloride 0.9 % 100 mL  IVPB  Status:  Discontinued        1 g 200 mL/hr over 30 Minutes Intravenous Every 12 hours 09/16/19 0756 09/16/19 0803   09/16/19 0845  piperacillin-tazobactam (ZOSYN) IVPB 3.375 g        3.375 g 100 mL/hr over 30 Minutes Intravenous  Once 09/16/19 0834 09/16/19 1007   09/16/19 0845  vancomycin (VANCOREADY) IVPB 1500 mg/300 mL        1,500 mg 150 mL/hr over 120 Minutes Intravenous  Once 09/16/19 0839 09/16/19 1248   09/16/19 0830  piperacillin-tazobactam (ZOSYN) IVPB 3.375 g  Status:  Discontinued        3.375 g 12.5 mL/hr over 240 Minutes Intravenous Every 8 hours 09/16/19 0803 09/16/19 0834   09/16/19 0800  vancomycin (VANCOCIN) IVPB 1000 mg/200 mL premix  Status:  Discontinued        1,000 mg 200 mL/hr over 60 Minutes Intravenous Every 12 hours 09/16/19 0756 09/16/19 0839   08/31/19 1400  Ampicillin-Sulbactam (UNASYN) 3 g in sodium chloride 0.9 % 100 mL IVPB  Status:  Discontinued        3 g 200 mL/hr over 30 Minutes Intravenous Every 6 hours 08/31/19 1357 09/02/19 0819   08/25/19 0723  ceFAZolin (ANCEF) IVPB 2g/100 mL premix        2 g 200 mL/hr over 30 Minutes Intravenous 60 min pre-op 08/25/19 0723 08/25/19 0930      PRN meds: acetaminophen **OR** acetaminophen (TYLENOL) oral liquid 160 mg/5 mL **OR** acetaminophen, albuterol, fentaNYL (SUBLIMAZE) injection, hydrALAZINE, labetalol, lip balm, oxyCODONE, sodium chloride flush   Objective: Vitals:   10/10/19 0822 10/10/19 0823  BP:    Pulse:  (!) 110  Resp:  22  Temp:    SpO2: 98% 98%    Intake/Output Summary (Last 24 hours) at 10/10/2019 1100 Last data filed at 10/10/2019 0600 Gross per 24 hour  Intake 588.18 ml  Output 2650 ml  Net -2061.82 ml   Filed Weights   10/06/19 0400 10/07/19 0300 10/08/19 0500  Weight: 78.9 kg 78.5 kg 79.2 kg   Weight change:  Body mass index is 32.99 kg/m.   Physical Exam: General exam: Appears calm and comfortable.  Not in acute physical distress Skin: No rashes, lesions or  ulcers. HEENT: Has a tracheostomy tube anteriorly.  On 5 L oxygen by nasal cannula. Lungs: Diminished air entry on both bases, no crackles or wheezing CVS: Regular rate and rhythm, no murmur GI/Abd soft, nontender, nondistended, bowel sound present CNS: Alert, awake, able to follow motor commands to move right side Psychiatry: Mood  and affect normal Extremities: No pedal edema, no calf tenderness.  Legs on boots to prevent pressure ulcers.  Data Review: I have personally reviewed the laboratory data and studies available.  Recent Labs  Lab 10/07/19 0617 10/09/19 0731 10/10/19 0507  WBC 7.0 4.8 5.2  NEUTROABS  --   --  3.4  HGB 6.6* 7.6* 8.4*  HCT 23.5* 27.4* 29.0*  MCV 101.7* 103.0* 99.7  PLT 262 274 307   Recent Labs  Lab 10/04/19 0500 10/06/19 1155 10/10/19 0507  NA 140 143 141  K 4.0 3.8 3.3*  CL 100 103 104  CO2 32 29 29  GLUCOSE 260* 223* 145*  BUN 20 16 <5*  CREATININE 0.69 0.71 0.55  CALCIUM 9.0 9.2 9.1  MG 2.0  --  1.8  PHOS  --  3.4 2.9   Lab Results  Component Value Date   HGBA1C 5.2 10/03/2019       Component Value Date/Time   CHOL 217 (H) 05/22/2019 1312   TRIG 114 05/22/2019 1312   HDL 65 05/22/2019 1312   CHOLHDL 3.3 05/22/2019 1312   LDLCALC 132 (H) 05/22/2019 1312   LABVLDL 20 05/22/2019 1312    Signed, Terrilee Croak, MD [facility] Pager: [phone] (Secure Chat preferred). 10/10/2019

## 2019-10-10 NOTE — Procedures (Signed)
Pre procedure Dx: Dysphagia Post Procedure Dx: Same  Successful fluoroscopic guided insertion of gastrostomy tube.   The gastrostomy tube may be used immediately for medications.   Tube feeds may be initiated in 24 hours as per the primary team.    EBL: Minimal  Complications: None immediate  Jay Bettyjean Stefanski, MD Pager #: [phone]    

## 2019-10-10 NOTE — TOC Progression Note (Signed)
Transition of Care [facility]) - Progression Note    Patient Details  Name: Donna [name] MRN: [id number] Date of Birth: 25-Oct-1950  [facility] CM/SW Contact  Jimmy Picket, [place] Phone Number: 10/10/2019, 3:02 PM  Clinical Narrative:     Pt is awaiting PEG placement. When PEG is placed CSW will begin SNF search.   Expected Discharge Plan: Skilled Nursing Facility Barriers to Discharge: Continued Medical Work up  Expected Discharge Plan and Services Expected Discharge Plan: Skilled Nursing Facility                                               Social Determinants of Health (SDOH) Interventions    Readmission Risk Interventions No flowsheet data found.  Jimmy Picket, Theresia Majors, [place] Clinical Social Worker [phone]

## 2019-10-10 NOTE — Progress Notes (Addendum)
Nutrition Follow-up  DOCUMENTATION CODES:   Not applicable  INTERVENTION:   Once PEG able to be used: - Jevity 1.2 @ 20 ml/hr - Increase by 10 ml Q4 hours to goal rate of 50 ml/hr (1200 ml) - ProSource TF 45 ml TID  Tube feeding regimen provides 1560 kcal, 99 grams of protein, and 973 ml of H2O.  NUTRITION DIAGNOSIS:   Inadequate oral intake related to inability to eat as evidenced by NPO status.  Ongoing  GOAL:   Patient will meet greater than or equal to 90% of their needs  Addressed via TF  MONITOR:   PO intake, Supplement acceptance, Diet advancement, Labs, Weight trends, TF tolerance  REASON FOR ASSESSMENT:   Consult Enteral/tube feeding initiation and management  ASSESSMENT:   Pt who is a Jehovah witness with PMH of HTN, HLD, R MCA CVA 06/2018, DM, anemia, obesity, OSA admitted 6/14 for cerebral angiogram s/p R ICA angioplasty for severe stenosis.  6/14 - s/p balloon angioplasty for severe stenosis, pt developed lethargy and increased L sided weakness found to have new watershed infarcts, started on vasopressors for cerebral perfusion 6/16 - cortrak placed, tip in stomach 6/17 - pt with increased agitation requiring increased O2 6/18 - pt pulled out Cortrak, Cortrak replaced 6/19 - Cortrak removed 6/20 - intubated 6/23 - Cortrak replaced (tip gastric per Cortrak team), extubated, later reintubated 7/3 - s/p trach   Pt had another vomiting episode with restart of tube feeding. Pulled out Cortrak yesterday. Had PEG placed per IR. Plan to start feeding tomorrow. Titrate to goal. Remains NPO. SLP to follow up.   Admission weight: 77.5 kg  Current weight: 79.2 kg   Drips: D5 in 1/2NS @ 50 ml/hr  Medications: SS novolog, lantus Labs: K 3.3 (L) CBG [phone]  Diet Order:   Diet Order            Diet NPO time specified  Diet effective now                 EDUCATION NEEDS:   Not appropriate for education at this time  Skin:  Skin Assessment: Skin  Integrity Issues: Incisions: right groin MASD: buttocks, R groin  Last BM:  7/28  Height:   Ht Readings from Last 1 Encounters:  08/31/19 5\' 1"  (1.549 m)    Weight:   Wt Readings from Last 1 Encounters:  10/08/19 79.2 kg    Ideal Body Weight:  47.7 kg  BMI:  Body mass index is 32.99 kg/m.  Estimated Nutritional Needs:   Kcal:  1550-1750  Protein:  90-105 grams  Fluid:  >1.6 L/day  10/10/19 RD, LDN Clinical Nutrition Pager listed in AMION

## 2019-10-10 NOTE — Progress Notes (Signed)
Patient's sister seen at bedside. Gastrostomy tube procedure discussed directly with sister Buzzy Han.   Risks and benefits image guided gastrostomy tube placement was discussed with the patient including, but not limited to the need for a barium enema during the procedure, bleeding, infection, peritonitis and/or damage to adjacent structures.  All of the patient's sisters questions were answered, patient is agreeable to proceed.  Consent signed and in chart.

## 2019-10-10 NOTE — Progress Notes (Signed)
Discussed g-tube placement today with patient's son and aunt via phone multiple times this morning - [place] declines to provide phone consent and wishes for his aunt to sign consent in person. He states she will be in the hospital around 10:30 am today. We discussed that she is currently scheduled for a 10 am procedure and we will do our best to push this back however if we are unable to place the g-tube today she will need to be rescheduled for Monday and a cortrak will need to be placed today.   [place] states understanding of the above and again declines to give consent via phone. Will await arrival of his aunt for consent. RN to page when aunt arrives.  Lynnette Caffey, PA-C Pager# [phone]

## 2019-10-10 NOTE — Progress Notes (Signed)
IR documentation sedation narrator will not allow for documentation at this time.  The patient received sedation for gastrostomy tube placement by Dr. Simonne Come. A timeout was completed at 14:14 with a signed and verified consent.  Versed 2 mg, fentanyl 100 mcg, glucogen 1 mg, and ancef 2g. The patients vitals remained stable. The total procedure time was 17 minutes. Last vital signs were 137/78, 107 Sinus Tach, 20 respirations, 100% on 6L trach collar.

## 2019-10-10 NOTE — Progress Notes (Signed)
IR notified that family was ready to proceed with placement of G-tube. I called her son Mieko Kneebone to obtain consent. He stated he needed to consult with other family members and he asked me to call the patient's sister Scarlette Calico, to explain the procedure to her. I will call Scarlette Calico to explain the procedure to her.   Alwyn Ren, NP Interventional Radiology

## 2019-10-10 NOTE — Progress Notes (Signed)
  Speech Language Pathology Treatment: Hillary Bow Speaking valve  Patient Details Name: Donna [name] MRN: [id number] DOB: 02-07-1951 Today's Date: 10/10/2019 Time: 1015-1050 SLP Time Calculation (min) (ACUTE ONLY): 35 min  Assessment / Plan / Recommendation Clinical Impression  Patient seen for skilled therapy to address goals related to use of PMV.  Dysphagia goals were unable to be addressed this date as the pt was NPO for an impending procedure.  Her sister Scarlette Calico was present for this evaluation and extensively educated.  The patient currently has a Shiley #6 cuffless trach in place.  She is on a trach collar with an FiO2 of 28% at 5L/minute.  Baseline vitals prior to placement were as follows:  heart rate in the low 90s and O2 levels at 100%.  Given placement of the PMV the patient's vitals were steady with her heart rate in the high 80s-low 90s and her O2 levels never fell below 100%.  PMV was removed after approximately 1 minute of wearing with no indication of air trapping.  She was noted to have stridorous type breathing that was not seen consistently with PMV in place.  She was able to wear the PMV for 20 minutes without any respiratory distress.  She was able to produced intelligible speech with about 3-4 max words per breath.  At times, she would be non responsive to questions requiring physical cue to redirect her to the current task.  Education regarding proper donning and doffing of the valve was begun with her sister Scarlette Calico.  Recommend that PMV be placed with ST only.  ST will continue to follow for PMV use and to re initiate swallowing therapy.     HPI HPI: 69 y.o. female with PMH significant for HTN, HLD, CVA 06/2018, DM, anemia, obesity, OSA, and OA. Pt underwent R ICA angioplasty on 6/14. Pt with decline in neuro status and hemoglobin on 6/15, found to have a large R groin hematoma.  Pt has had Hbg 5.0 or less and is a Jehovah's witness. MRI showed scattered small acute infarcts  along the right MCA/watershed territory. Solitary small acute infarct in the left parietal cortex and right cerebellum. She was intubated on 6/19. Pt underwent tracheostomy on 7/3. PT found to be septic with RLL PNA on 7/6.  MBS 7/22 - started on dys1/nectars.  Patient now with a Shiley #6 cuffless trach in place.  She has planned PEG placement 7/30.        SLP Plan  Continue with current plan of care       Recommendations         Patient may use Passy-Muir Speech Valve: with SLP only PMSV Supervision: Full         Oral Care Recommendations: Oral care QID Follow up Recommendations: Skilled Nursing facility;24 hour supervision/assistance Plan: Continue with current plan of care       GO               Dimas Aguas, MA, CCC-SLP Acute Rehab SLP [phone]  Fleet Contras 10/10/2019, 11:29 AM

## 2019-10-11 DIAGNOSIS — I6521 Occlusion and stenosis of right carotid artery: Secondary | ICD-10-CM | POA: Diagnosis not present

## 2019-10-11 LAB — GLUCOSE, CAPILLARY
Glucose-Capillary: 129 mg/dL — ABNORMAL HIGH (ref 70–99)
Glucose-Capillary: 141 mg/dL — ABNORMAL HIGH (ref 70–99)
Glucose-Capillary: 151 mg/dL — ABNORMAL HIGH (ref 70–99)
Glucose-Capillary: 153 mg/dL — ABNORMAL HIGH (ref 70–99)
Glucose-Capillary: 172 mg/dL — ABNORMAL HIGH (ref 70–99)
Glucose-Capillary: 186 mg/dL — ABNORMAL HIGH (ref 70–99)

## 2019-10-11 NOTE — Progress Notes (Signed)
PROGRESS NOTE  Donna THIBEAUX  DOB: [birth year]  PCP: Minette Brine, [place] MBT:[id number]  DOA: 08/25/2019  LOS: 47 days   No chief complaint on file.  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  6/14, patient was admitted by Neuro IR and underwent cerebral angiogram s/p RT ICA cervical/ petrous junction balloon angioplasty for severe stenosis via right femoral approach. Postprocedure, patient was placed on ASA/ brillinta and monitored in Neuro ICU.  6/15, noted hypotension, increased left sided weakness, lethargy and left-sided neglect. Hgb drop from 13.3 to 6.9 with new AKI. Post MRI/ MRA showed scattered small acute infarcts along the right MCA/ watershed territory suspected to be resultant either post procedure vs hypoperfusion. Continues to have left-sided neglect. 6/15, CT abd/ pelvis was obtained given concern for retroperitoneal bleed which showed a mild to moderate amount of retroperitoneal hematoma in the right iliac and inguinal regiones with moderate size hematoma noted the the soft tissues anterior to the musculature of the right hip; no definite intrapertioneal hemorrhage. INR, PT, and platelets rechecked and were wnl. Vascular surgery was consulted.  No intervention done by vascular given bleeding was mostly into the muscle and tissue.  Patient has had long hospitalization since then.  6/18 -intubated, reintubated 6/23 and 6/28 for failed trial of extubation. 7/3 -underwent tracheostomy 7/25-7/26, patient developed respiratory distress with significant desaturation. CXR did not show any acute finding. Tube feed was kept on held given concern for aspiration. Tracheal aspiration was performed.  Received high-dose Solu-Medrol and Lasix.  PCCM consulted.   CTA chest negative for PE but showed GGO and nodular opacities in bilateral upper lobes.  7/30, patient underwent successful PEG tube placement by IR  Subjective: Patient was seen and examined this morning.   Lying down in bed.  Not in distress.  Son at bedside. Patient underwent PEG tube placement yesterday.  No change in status otherwise.  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  -No anticoagulation or antiplatelet given bleed as above -Final disposition likely SNF if stable -Neurology signed off.  Acutemetabolicencephalopathy -Improved. Responds to questions by nodding and follows motor command. -cotninue seroquel at 136m QHS (has helped come off precedex) -monitor QTc intermittently.  Dysphagia:  -due to CVA. -7/30, patient underwent successful PEG tube placement by IR -Nutrition consult appreciated.  Tube feeding to be started today. -Continue aspiration precautions.  Acute on chronic respiratory failure with hypoxia:  Aspiration pneumonia. -Currently on trach collar at 5 L/min.  -7/25-7/26, patient developed respiratory distress with significant desaturation. CXR did not show any acute finding. Tube feed was kept on held given concern for aspiration. Tracheal aspiration was performed.  Received high-dose Solu-Medrol and Lasix. PCCM consulted. CTA chest negative for PE but showed GGO and nodular opacities in bilateral upper lobes.  -Respiratory status improved.  Continue Pulmicort and DuoNeb.  Not on antibiotics. -Appreciate PCCM input.  Volume Overload Essential hypertension -Appears euvolemic on exam.  Intermittently received IV Lasix. -Goal to keep SBP<160 given intracranial stenosis. -Currently on amlodipine 10 mg daily and hydralazine 100 mg every 6 hours.  Acute blood loss anemia   Anemia of critical illness:  -Jehovah's Witness, no blood products including albumin. -Continue severe anemia protocol: Iron, Folic acid,  vitamin B12, Monitor off Epogen and Vit C that was previously given -Intermittently monitor H&H.  Hemoglobin on last blood work on 7/30 was 8.4. -Avoid blood draws if possible  Diabetes With hyperglycemia Lab Results  Component Value Date   HGBA1C 5.2 10/03/2019   -Currently blood sugar trending less than 200 on Levemir 12 units and sliding scale insulin.  Continue the same with Accu-Cheks.  Debility/physical deconditioning -Continue PT/OT when medically stable.  Mobility: PT evaluation Code Status:   Code Status: Full Code  Nutritional status: Body mass index is 30.87 kg/m. Nutrition Problem: Inadequate oral intake Etiology: inability to eat Signs/Symptoms: NPO status Diet Order            Diet NPO time specified  Diet effective now                 DVT prophylaxis: Place and maintain sequential compression device Start: 09/26/19 1319   Antimicrobials:  Currently on none Fluid: None  Consultants: PCCM, neuro IR, IR, Family Communication:  None at bedside  Status is: Inpatient  Remains inpatient appropriate because: Pending SNF placement   Dispo: The patient is from: Home              Anticipated d/c is to: SNF              Anticipated d/c date is: Whenever bed/insurance authorization available              Patient currently is medically stable to d/c.   Infusions:  . sodium chloride 10 mL/hr at 09/25/19 1800  . dextrose 5 % and 0.45% NaCl 50 mL/hr at 10/11/19 0228  . feeding supplement (JEVITY 1.2 CAL)      Scheduled Meds: . amLODipine  10 mg Per Tube Daily  . atorvastatin  40 mg Per NG tube Daily  . budesonide (PULMICORT) nebulizer solution  0.5 mg Nebulization BID  . chlorhexidine gluconate (MEDLINE KIT)  15 mL Mouth Rinse BID  . Chlorhexidine Gluconate Cloth  6 each Topical Daily  . famotidine  20 mg Per  Tube BID  . feeding supplement (PROSource TF)  45 mL Per Tube TID  . ferrous sulfate  220 mg Per Tube TID WC  . hydrALAZINE  100 mg Per Tube Q6H  . hydrocerin   Topical TID  . insulin aspart  0-15 Units Subcutaneous Q4H  . insulin aspart  4 Units Subcutaneous Q4H  . insulin glargine  12 Units Subcutaneous Daily  . ipratropium-albuterol  3 mL Nebulization TID  . mouth rinse  15 mL Mouth Rinse 10 times per day  . QUEtiapine  100 mg Per Tube QHS  . sodium chloride flush  10-40 mL Intracatheter Q12H    Antimicrobials: Anti-infectives (From admission, onward)   Start     Dose/Rate Route Frequency Ordered Stop   10/10/19 1357  ceFAZolin (ANCEF) 2-4 GM/100ML-% IVPB       Note to Pharmacy: Leak, Brandi   : cabinet override      10/10/19 1357 10/10/19 1410   10/10/19 0830  ceFAZolin (ANCEF) 2-4 GM/100ML-% IVPB       Note to Pharmacy: Domenick Bookbinder   : cabinet override      10/10/19 0830 10/10/19 2044   09/16/19 2130  vancomycin (VANCOCIN) IVPB 1000 mg/200 mL premix  Status:  Discontinued        1,000 mg 200 mL/hr over 60 Minutes Intravenous Every 12 hours 09/16/19 0839 09/18/19 0756   09/16/19 1630  piperacillin-tazobactam (ZOSYN) IVPB 3.375  g        3.375 g 12.5 mL/hr over 240 Minutes Intravenous Every 8 hours 09/16/19 0835 09/23/19 1153   09/16/19 1000  ceFEPIme (MAXIPIME) 1 g in sodium chloride 0.9 % 100 mL IVPB  Status:  Discontinued        1 g 200 mL/hr over 30 Minutes Intravenous Every 12 hours 09/16/19 0756 09/16/19 0803   09/16/19 0845  piperacillin-tazobactam (ZOSYN) IVPB 3.375 g        3.375 g 100 mL/hr over 30 Minutes Intravenous  Once 09/16/19 0834 09/16/19 1007   09/16/19 0845  vancomycin (VANCOREADY) IVPB 1500 mg/300 mL        1,500 mg 150 mL/hr over 120 Minutes Intravenous  Once 09/16/19 0839 09/16/19 1248   09/16/19 0830  piperacillin-tazobactam (ZOSYN) IVPB 3.375 g  Status:  Discontinued        3.375 g 12.5 mL/hr over 240 Minutes Intravenous Every 8 hours 09/16/19  0803 09/16/19 0834   09/16/19 0800  vancomycin (VANCOCIN) IVPB 1000 mg/200 mL premix  Status:  Discontinued        1,000 mg 200 mL/hr over 60 Minutes Intravenous Every 12 hours 09/16/19 0756 09/16/19 0839   08/31/19 1400  Ampicillin-Sulbactam (UNASYN) 3 g in sodium chloride 0.9 % 100 mL IVPB  Status:  Discontinued        3 g 200 mL/hr over 30 Minutes Intravenous Every 6 hours 08/31/19 1357 09/02/19 0819   08/25/19 0723  ceFAZolin (ANCEF) IVPB 2g/100 mL premix        2 g 200 mL/hr over 30 Minutes Intravenous 60 min pre-op 08/25/19 0723 08/25/19 0930      PRN meds: acetaminophen **OR** acetaminophen (TYLENOL) oral liquid 160 mg/5 mL **OR** acetaminophen, albuterol, fentaNYL (SUBLIMAZE) injection, hydrALAZINE, iohexol, labetalol, lip balm, oxyCODONE, sodium chloride flush   Objective: Vitals:   10/11/19 0700 10/11/19 0813  BP: (!) 168/62   Pulse: 82   Resp: 19   Temp: 99.2 F (37.3 C)   SpO2: 100% 96%    Intake/Output Summary (Last 24 hours) at 10/11/2019 1104 Last data filed at 10/11/2019 0400 Gross per 24 hour  Intake 1002.86 ml  Output 600 ml  Net 402.86 ml   Filed Weights   10/07/19 0300 10/08/19 0500 10/11/19 0500  Weight: 78.5 kg 79.2 kg 74.1 kg   Weight change:  Body mass index is 30.87 kg/m.   Physical Exam: General exam: Appears calm and comfortable.  Not in acute physical distress Skin: No rashes, lesions or ulcers. HEENT: Has a tracheostomy tube anteriorly.  On 5 L oxygen by nasal cannula. Lungs: Diminished air entry on both bases, no crackles or wheezing CVS: Regular rate and rhythm, no murmur GI/Abd soft, nontender, nondistended, bowel sound present.  PEG tube site intact. CNS: Alert, awake, able to follow motor commands to move right side.  Psychiatry: Mood and affect normal Extremities: No pedal edema, no calf tenderness.  Legs on boots to prevent pressure ulcers.  Data Review: I have personally reviewed the laboratory data and studies  available.  Recent Labs  Lab 10/07/19 0617 10/09/19 0731 10/10/19 0507  WBC 7.0 4.8 5.2  NEUTROABS  --   --  3.4  HGB 6.6* 7.6* 8.4*  HCT 23.5* 27.4* 29.0*  MCV 101.7* 103.0* 99.7  PLT 262 274 307   Recent Labs  Lab 10/06/19 1155 10/10/19 0507  NA 143 141  K 3.8 3.3*  CL 103 104  CO2 29 29  GLUCOSE 223* 145*  BUN 16 <  5*  CREATININE 0.71 0.55  CALCIUM 9.2 9.1  MG  --  1.8  PHOS 3.4 2.9   Lab Results  Component Value Date   HGBA1C 5.2 10/03/2019       Component Value Date/Time   CHOL 217 (H) 05/22/2019 1312   TRIG 114 05/22/2019 1312   HDL 65 05/22/2019 1312   CHOLHDL 3.3 05/22/2019 1312   LDLCALC 132 (H) 05/22/2019 1312   LABVLDL 20 05/22/2019 1312    Signed, Terrilee Croak, MD [facility] Pager: [phone] (Secure Chat preferred). 10/11/2019

## 2019-10-11 NOTE — Progress Notes (Signed)
Referring Physician(s): Dr. Angie Fava  Supervising Physician: Luanne Bras  Patient Status:  [facility] - In-pt  Chief Complaint:  Dysphagia s/p G tube placement on 7.30.21  Subjective: 69 y.o. female inpatient. Jehovah Witness. History of HTN, HLD, DM, anemia. right MCA CVA  In May of 2020 with residual left hemiparesis and a CVA in June 2021 secondary to right ICA cervical/ petrous junction stenosis s/p re vasculation and balloon angioplasty via right radial and right femoral approach on 6.14.21 by Dr. Luanne Bras. IR placed a gastrostomy tube on 7.30.21. Hospital stay complicated by acute on chronic respiratory failure and hypoxia s/p trach  and acute metabolic encephalopathy since improved.  Patient alert and laying in bed, calm and comfortable. Patient able to nod her head, abdominal pain or nausea. Patient is able to follow commands with left sided weakness. No family at bedside at time of assessment.    Allergies: Patient has no known allergies.  Medications: Prior to Admission medications   Medication Sig Start Date End Date Taking? Authorizing Provider  amLODipine (NORVASC) 10 MG tablet Take 1 tablet (10 mg total) by mouth daily. 05/12/19  Yes Minette Brine, FNP  aspirin EC 81 MG tablet Take 81 mg by mouth every evening.   Yes [provider]  Calcium Carbonate-Vitamin D (CALCIUM-D PO) Take 2 tablets by mouth daily at 12 noon.    Yes [provider]  carvedilol (COREG) 6.25 MG tablet TAKE 1 TABLET(6.25 MG) BY MOUTH TWICE DAILY Patient taking differently: Take 6.25 mg by mouth 2 (two) times daily with a meal.  07/31/19  Yes Minette Brine, FNP  hydrALAZINE (APRESOLINE) 50 MG tablet Take 1 tablet (50 mg total) by mouth 2 (two) times daily. 06/16/19 09/14/19 Yes Elouise Munroe, MD  Insulin Degludec-Liraglutide (XULTOPHY) 100-3.6 UNIT-MG/ML SOPN Inject 30 Units into the skin daily. 08/14/19  Yes Minette Brine, FNP  telmisartan-hydrochlorothiazide (MICARDIS  HCT) 40-12.5 MG tablet Take 1 tablet by mouth daily. 07/31/19  Yes Minette Brine, FNP  ticagrelor (BRILINTA) 90 MG TABS tablet Take 1 tablet (90 mg total) by mouth 2 (two) times daily. 06/12/19  Yes Elouise Munroe, MD  vitamin C (ASCORBIC ACID) 250 MG tablet Take 500 mg by mouth daily at 12 noon.   Yes [provider]  blood glucose meter kit and supplies KIT Dispense based on patient and insurance preference. Use up to four times daily as directed. (FOR ICD-9 250.00, 250.01). 05/10/18   Minette Brine, FNP  Blood Glucose Monitoring Suppl (TRUE METRIX METER) w/Device KIT 1 each by Does not apply route in the morning, at noon, in the evening, and at bedtime. 05/22/19   Minette Brine, FNP  Evolocumab (REPATHA SURECLICK) 659 MG/ML SOAJ Inject 140 mg into the skin every 14 (fourteen) days. 06/30/19   Elouise Munroe, MD  glucose blood (TRUE METRIX BLOOD GLUCOSE TEST) test strip Check blood sugar 4 times a day before meals and bedtime 05/22/19   Minette Brine, FNP  Insulin Glargine-Lixisenatide (SOLIQUA) 100-33 UNT-MCG/ML SOPN Inject 25 Units into the skin daily. Patient taking differently: Inject 30 Units into the skin daily.  07/14/19   Minette Brine, FNP  Multiple Vitamin (MULTIVITAMIN PO) Take 1 tablet by mouth daily at 12 noon.     [provider]  Omega-3 Fatty Acids (OMEGA-3 PLUS PO) Take 2 capsules by mouth daily at 12 noon.    [provider]     Vital Signs: BP (!) 165/81   Pulse 76  Temp 99 F (37.2 C) (Oral)   Resp 23   Ht '5\' 1"'$  (1.549 m)   Wt 163 lb 5.8 oz (74.1 kg)   SpO2 96%   BMI 30.87 kg/m   Physical Exam  Imaging: IR GASTROSTOMY TUBE MOD SED  Result Date: 10/10/2019 INDICATION: Dysphagia. Please perform percutaneous gastrostomy tube placement for enteric nutrition supplementation purposes. EXAM: PULL TROUGH GASTROSTOMY TUBE PLACEMENT COMPARISON:  CT abdomen pelvis-08/27/2018 MEDICATIONS: Ancef 2 gm IV; Antibiotics were administered within 1 hour of  the procedure. Glucagon 1 mg IV CONTRAST:  10 mL of Omnipaque 300 administered into the gastric lumen. ANESTHESIA/SEDATION: Moderate (conscious) sedation was employed during this procedure. A total of Versed 2 mg and Fentanyl 100 mcg was administered intravenously. Moderate Sedation Time: 20 minutes. The patient's level of consciousness and vital signs were monitored continuously by radiology nursing throughout the procedure under my direct supervision. FLUOROSCOPY TIME:  6 minutes, 6 seconds (69 mGy) COMPLICATIONS: None immediate. PROCEDURE: Informed written consent was obtained from patient's family (sister) following explanation of the procedure, risks, benefits and alternatives. A time out was performed prior to the initiation of the procedure. Ultrasound scanning was performed to demarcate the edge of the left lobe of the liver. Maximal barrier sterile technique utilized including caps, mask, sterile gowns, sterile gloves, large sterile drape, hand hygiene and Betadine prep. The left upper quadrant was sterilely prepped and draped. An oral gastric catheter was inserted into the stomach under fluoroscopy. The existing nasogastric feeding tube was removed. The left costal margin and air opacified transverse colon were identified and avoided. Air was injected into the stomach for insufflation and visualization under fluoroscopy. Under sterile conditions a 17 gauge trocar needle was utilized to access the stomach percutaneously beneath the left subcostal margin after the overlying soft tissues were anesthetized with 1% Lidocaine with epinephrine. Needle position was confirmed within the stomach with aspiration of air and injection of small amount of contrast. A single T tack was deployed for gastropexy. Over an Amplatz guide wire, a 9-French sheath was inserted into the stomach. A snare device was utilized to capture the oral gastric catheter. The snare device was pulled retrograde from the stomach up the esophagus  and out the oropharynx. The 20-French pull-through gastrostomy was connected to the snare device and pulled antegrade through the oropharynx down the esophagus into the stomach and then through the percutaneous tract external to the patient. The gastrostomy was assembled externally. Contrast injection confirms position in the stomach. Several spot radiographic images were obtained in various obliquities for documentation. The patient tolerated procedure well without immediate post procedural complication. FINDINGS: After successful fluoroscopic guided placement, the gastrostomy tube is appropriately positioned with internal disc against the ventral aspect of the gastric lumen. IMPRESSION: Successful fluoroscopic insertion of a 20-French pull-through gastrostomy tube. The gastrostomy may be used immediately for medication administration and in 24 hrs for the initiation of feeds. Electronically Signed   By: Sandi Mariscal M.D.   On: 10/10/2019 16:02   DG CHEST PORT 1 VIEW  Result Date: 10/09/2019 CLINICAL DATA:  69 year old female status post PICC placement. EXAM: PORTABLE CHEST 1 VIEW COMPARISON:  Chest radiograph dated 10/06/2019. FINDINGS: Right-sided PICC with tip over central SVC. Tracheostomy remains above the carina. Feeding tube extends below the diaphragm with tip beyond the inferior margin of the image. Mild eventration of the right hemidiaphragm. There is no focal consolidation, pleural effusion, pneumothorax. Mild interstitial nodularity. The cardiac silhouette is within limits. Atherosclerotic calcification of the aorta. Mild  bilateral hilar prominence. No acute osseous pathology. IMPRESSION: 1. Right-sided PICC with tip over central SVC. 2. No focal consolidation. Electronically Signed   By: Anner Crete M.D.   On: 10/09/2019 17:13    Labs:  CBC: Recent Labs    09/26/19 0257 10/07/19 0617 10/09/19 0731 10/10/19 0507  WBC 11.3* 7.0 4.8 5.2  HGB 6.8* 6.6* 7.6* 8.4*  HCT 22.5* 23.5* 27.4*  29.0*  PLT 247 262 274 307    COAGS: Recent Labs    11/27/18 0728 08/18/19 0705 08/26/19 1715 08/27/19 0230  INR 0.9 0.9 1.1 1.2    BMP: Recent Labs    09/26/19 0537 10/04/19 0500 10/06/19 1155 10/10/19 0507  NA 141 140 143 141  K 3.8 4.0 3.8 3.3*  CL 106 100 103 104  CO2 30 32 29 29  GLUCOSE 141* 260* 223* 145*  BUN 17 20 16  <5*  CALCIUM 8.7* 9.0 9.2 9.1  CREATININE 0.58 0.69 0.71 0.55  GFRNONAA >60 >60 >60 >60  GFRAA >60 >60 >60 >60    LIVER FUNCTION TESTS: Recent Labs    09/17/19 0703 09/17/19 0703 09/20/19 0500 09/21/19 0500 09/26/19 0537 10/06/19 1155  BILITOT 1.1  --  0.7 0.3 0.5  --   AST 32  --  27 31 31   --   ALT 23  --  29 36 42  --   ALKPHOS 82  --  95 81 57  --   PROT 6.6  --  7.4 7.1 5.5*  --   ALBUMIN 1.6*   < > 1.5* 1.6* 1.8* 2.5*   < > = values in this interval not displayed.    Assessment and Plan:  69 y.o. female inpatient Jehovah Witness. History of HTN, HLD, DM, anemia. right MCA CVA  In May of 2020 with residual left hemiparesis and a CVA in June 2021 secondary to right ICA cervical/ petrous junction stenosis s/p re vasculation and balloon angioplasty via right radial and right femoral approach on 6.14.21 by Dr. Luanne Bras. IR placed a gastrostomy tube on 7.30.21. Hospital stay complicated by acute on chronic respiratory failure and hypoxia s/p trach  and acute metabolic encephalopathy since improved.    Hgb 8.4. p[atient is on brillinta.   Patient seen for g-tube site s/p placement of 20 Fr pull-through gastrostomy tube on 7.30.21 in IR with Dr. Pascal Lux   Insertion site is clean, dry, dressed and appropriately tender to palpation. Tube is capped on exam. No bleeding or leakage. Per staff tube has not yet been used.   Patient is able to respond to questions but nodding her head and following motor commands.   Ok to begin using g-tube for medicines, tube feeds, free water, etc.  Please call IR for any questions or concerns  regarding the g-tube.     Electronically Signed: Jacqualine Mau, NP 10/11/2019, 1:03 PM   I spent a total of 25 Minutes at the patient's bedside AND on the patient's hospital floor or unit, greater than 50% of which was counseling/coordinating care for gastrostomy tube placement on 7.30.21 and CVA re vascular on 6.14.21

## 2019-10-12 DIAGNOSIS — I6521 Occlusion and stenosis of right carotid artery: Secondary | ICD-10-CM | POA: Diagnosis not present

## 2019-10-12 LAB — GLUCOSE, CAPILLARY
Glucose-Capillary: 135 mg/dL — ABNORMAL HIGH (ref 70–99)
Glucose-Capillary: 149 mg/dL — ABNORMAL HIGH (ref 70–99)
Glucose-Capillary: 150 mg/dL — ABNORMAL HIGH (ref 70–99)
Glucose-Capillary: 156 mg/dL — ABNORMAL HIGH (ref 70–99)
Glucose-Capillary: 160 mg/dL — ABNORMAL HIGH (ref 70–99)
Glucose-Capillary: 186 mg/dL — ABNORMAL HIGH (ref 70–99)

## 2019-10-12 NOTE — Plan of Care (Signed)
  Problem: Education: Goal: Knowledge of General Education information will improve Description Including pain rating scale, medication(s)/side effects and non-pharmacologic comfort measures Outcome: Progressing   

## 2019-10-12 NOTE — Progress Notes (Signed)
PROGRESS NOTE  Donna [name]  DOB: [birth year]  PCP: Minette Brine, [place] OJJ:[id number]  DOA: 08/25/2019  LOS: 48 days   No chief complaint on file.  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  6/14, patient was admitted by Neuro IR and underwent cerebral angiogram s/p RT ICA cervical/ petrous junction balloon angioplasty for severe stenosis via right femoral approach. Postprocedure, patient was placed on ASA/ brillinta and monitored in Neuro ICU.  6/15, noted hypotension, increased left sided weakness, lethargy and left-sided neglect. Hgb drop from 13.3 to 6.9 with new AKI. Post MRI/ MRA showed scattered small acute infarcts along the right MCA/ watershed territory suspected to be resultant either post procedure vs hypoperfusion. Continues to have left-sided neglect. 6/15, CT abd/ pelvis was obtained given concern for retroperitoneal bleed which showed a mild to moderate amount of retroperitoneal hematoma in the right iliac and inguinal regiones with moderate size hematoma noted the the soft tissues anterior to the musculature of the right hip; no definite intrapertioneal hemorrhage. INR, PT, and platelets rechecked and were wnl. Vascular surgery was consulted.  No intervention done by vascular given bleeding was mostly into the muscle and tissue.  Patient has had long hospitalization since then.  6/18 -intubated, reintubated 6/23 and 6/28 for failed trial of extubation. 7/3 -underwent tracheostomy 7/25-7/26, patient developed respiratory distress with significant desaturation. CXR did not show any acute finding. Tube feed was kept on held given concern for aspiration. Tracheal aspiration was performed.  Received high-dose Solu-Medrol and Lasix.  PCCM consulted.   CTA chest negative for PE but showed GGO and nodular opacities in bilateral upper lobes.  7/30, patient underwent successful PEG tube placement by IR  Subjective: Patient was seen and examined this morning.   Not in distress.  No family at bedside.  No change in patient status.  PEG tube in use.  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  -No anticoagulation or antiplatelet because of bleeding -Final disposition likely SNF if stable -Neurology signed off.  Acutemetabolicencephalopathy -Improved. Responds to questions by nodding and follows motor command. -cotninue seroquel at 169m QHS (has helped come off precedex) -monitor QTc intermittently.  Dysphagia:  -due to CVA. -7/30, patient underwent successful PEG tube placement by IR -Nutrition consult appreciated.  PEG tube feeding started.. -Continue aspiration precautions.  Acute on chronic respiratory failure with hypoxia:  Aspiration pneumonia. -Currently on trach collar at 5 L/min.  -7/25-7/26, patient developed respiratory distress with significant desaturation. CXR did not show any acute finding. Tube feed was kept on held given concern for aspiration. Tracheal aspiration was performed.  Received high-dose Solu-Medrol and Lasix. PCCM consulted. CTA chest negative for PE but showed GGO and nodular opacities in bilateral upper lobes.  -Respiratory status improved.  Continue Pulmicort and DuoNeb.  Not on antibiotics. -Appreciate PCCM input.  Volume Overload Essential hypertension -Appears euvolemic on exam.  Intermittently received IV Lasix. -Goal to keep SBP<160 given intracranial stenosis. -Currently on amlodipine 10 mg daily and hydralazine 100 mg every 6 hours.  Acute blood loss anemia  Anemia of critical illness:  -Jehovah's Witness, no blood  products including albumin. -Continue severe anemia protocol: Iron, Folic acid, vitamin BH37 Monitor off Epogen and Vit C  that was previously given -Intermittently monitor H&H.  Hemoglobin on last blood work on 7/30 was 8.4. -Avoid blood draws if possible  Diabetes With hyperglycemia Lab Results  Component Value Date   HGBA1C 5.2 10/03/2019   -Currently blood sugar trending less than 200 on Levemir 12 units and sliding scale insulin.  Continue the same with Accu-Cheks.  Debility/physical deconditioning -Continue PT/OT when medically stable.  Mobility: PT evaluation Code Status:   Code Status: Full Code  Nutritional status: Body mass index is 30.62 kg/m. Nutrition Problem: Inadequate oral intake Etiology: inability to eat Signs/Symptoms: NPO status Diet Order            Diet NPO time specified  Diet effective now                 DVT prophylaxis: Place and maintain sequential compression device Start: 09/26/19 1319   Antimicrobials:  Currently on none Fluid: None  Consultants: PCCM, neuro IR, IR, Family Communication:  None at bedside  Status is: Inpatient  Remains inpatient appropriate because: Pending SNF placement   Dispo: The patient is from: Home              Anticipated d/c is to: SNF              Anticipated d/c date is: Whenever bed/insurance authorization available              Patient currently is medically stable to d/c.   Infusions:  . sodium chloride Stopped (10/09/19 0900)  . dextrose 5 % and 0.45% NaCl 50 mL/hr at 10/12/19 0700  . feeding supplement (JEVITY 1.2 CAL) 50 mL/hr at 10/12/19 0606    Scheduled Meds: . amLODipine  10 mg Per Tube Daily  . atorvastatin  40 mg Per NG tube Daily  . budesonide (PULMICORT) nebulizer solution  0.5 mg Nebulization BID  . chlorhexidine gluconate (MEDLINE KIT)  15 mL Mouth Rinse BID  . Chlorhexidine Gluconate Cloth  6 each Topical Daily  . famotidine  20 mg Per Tube BID  . feeding supplement (PROSource  TF)  45 mL Per Tube TID  . ferrous sulfate  220 mg Per Tube TID WC  . hydrALAZINE  100 mg Per Tube Q6H  . hydrocerin   Topical TID  . insulin aspart  0-15 Units Subcutaneous Q4H  . insulin aspart  4 Units Subcutaneous Q4H  . insulin glargine  12 Units Subcutaneous Daily  . ipratropium-albuterol  3 mL Nebulization TID  . mouth rinse  15 mL Mouth Rinse 10 times per day  . QUEtiapine  100 mg Per Tube QHS  . sodium chloride flush  10-40 mL Intracatheter Q12H    Antimicrobials: Anti-infectives (From admission, onward)   Start     Dose/Rate Route Frequency Ordered Stop   10/10/19 1357  ceFAZolin (ANCEF) 2-4 GM/100ML-% IVPB       Note to Pharmacy: Leak, Brandi   : cabinet override      10/10/19 1357 10/10/19 1410   10/10/19 0830  ceFAZolin (ANCEF) 2-4 GM/100ML-% IVPB       Note to Pharmacy: Domenick Bookbinder   : cabinet override      10/10/19 0830 10/10/19 2044   09/16/19 2130  vancomycin (VANCOCIN) IVPB 1000 mg/200 mL premix  Status:  Discontinued        1,000 mg 200 mL/hr over 60 Minutes Intravenous Every 12 hours 09/16/19 0839 09/18/19 0756   09/16/19 1630  piperacillin-tazobactam (ZOSYN) IVPB 3.375 g  3.375 g 12.5 mL/hr over 240 Minutes Intravenous Every 8 hours 09/16/19 0835 09/23/19 1153   09/16/19 1000  ceFEPIme (MAXIPIME) 1 g in sodium chloride 0.9 % 100 mL IVPB  Status:  Discontinued        1 g 200 mL/hr over 30 Minutes Intravenous Every 12 hours 09/16/19 0756 09/16/19 0803   09/16/19 0845  piperacillin-tazobactam (ZOSYN) IVPB 3.375 g        3.375 g 100 mL/hr over 30 Minutes Intravenous  Once 09/16/19 0834 09/16/19 1007   09/16/19 0845  vancomycin (VANCOREADY) IVPB 1500 mg/300 mL        1,500 mg 150 mL/hr over 120 Minutes Intravenous  Once 09/16/19 0839 09/16/19 1248   09/16/19 0830  piperacillin-tazobactam (ZOSYN) IVPB 3.375 g  Status:  Discontinued        3.375 g 12.5 mL/hr over 240 Minutes Intravenous Every 8 hours 09/16/19 0803 09/16/19 0834   09/16/19 0800   vancomycin (VANCOCIN) IVPB 1000 mg/200 mL premix  Status:  Discontinued        1,000 mg 200 mL/hr over 60 Minutes Intravenous Every 12 hours 09/16/19 0756 09/16/19 0839   08/31/19 1400  Ampicillin-Sulbactam (UNASYN) 3 g in sodium chloride 0.9 % 100 mL IVPB  Status:  Discontinued        3 g 200 mL/hr over 30 Minutes Intravenous Every 6 hours 08/31/19 1357 09/02/19 0819   08/25/19 0723  ceFAZolin (ANCEF) IVPB 2g/100 mL premix        2 g 200 mL/hr over 30 Minutes Intravenous 60 min pre-op 08/25/19 0723 08/25/19 0930      PRN meds: acetaminophen **OR** acetaminophen (TYLENOL) oral liquid 160 mg/5 mL **OR** acetaminophen, albuterol, fentaNYL (SUBLIMAZE) injection, hydrALAZINE, iohexol, labetalol, lip balm, oxyCODONE, sodium chloride flush   Objective: Vitals:   10/12/19 0818 10/12/19 0819  BP:    Pulse:  90  Resp:  18  Temp:    SpO2: 100%     Intake/Output Summary (Last 24 hours) at 10/12/2019 1049 Last data filed at 10/12/2019 0909 Gross per 24 hour  Intake 2616.45 ml  Output 650 ml  Net 1966.45 ml   Filed Weights   10/08/19 0500 10/11/19 0500 10/12/19 0426  Weight: 79.2 kg 74.1 kg 73.5 kg   Weight change: -0.6 kg Body mass index is 30.62 kg/m.   Physical Exam: General exam: Appears calm and comfortable.  Not in acute physical distress Skin: No rashes, lesions or ulcers. HEENT: Has a tracheostomy tube anteriorly. On 5 L oxygen by nasal cannula. Lungs: Clear to auscultation bilaterally. no crackles or wheezing. CVS: Regular rate and rhythm, no murmur GI/Abd soft, nontender, nondistended, bowel sound present.  PEG tube site intact. CNS: Alert, awake, able to follow motor commands to move right side.  Psychiatry: Mood and affect normal Extremities: No pedal edema, no calf tenderness. Legs on boots to prevent pressure ulcers.  Data Review: I have personally reviewed the laboratory data and studies available.  Recent Labs  Lab 10/07/19 0617 10/09/19 0731 10/10/19 0507   WBC 7.0 4.8 5.2  NEUTROABS  --   --  3.4  HGB 6.6* 7.6* 8.4*  HCT 23.5* 27.4* 29.0*  MCV 101.7* 103.0* 99.7  PLT 262 274 307   Recent Labs  Lab 10/06/19 1155 10/10/19 0507  NA 143 141  K 3.8 3.3*  CL 103 104  CO2 29 29  GLUCOSE 223* 145*  BUN 16 <5*  CREATININE 0.71 0.55  CALCIUM 9.2 9.1  MG  --  1.8  PHOS 3.4 2.9   Lab Results  Component Value Date   HGBA1C 5.2 10/03/2019       Component Value Date/Time   CHOL 217 (H) 05/22/2019 1312   TRIG 114 05/22/2019 1312   HDL 65 05/22/2019 1312   CHOLHDL 3.3 05/22/2019 1312   LDLCALC 132 (H) 05/22/2019 1312   LABVLDL 20 05/22/2019 1312    Signed, Terrilee Croak, MD [facility] Pager: [phone] (Secure Chat preferred). 10/12/2019

## 2019-10-13 ENCOUNTER — Telehealth: Payer: Self-pay

## 2019-10-13 ENCOUNTER — Ambulatory Visit: Payer: Medicare HMO

## 2019-10-13 DIAGNOSIS — Z794 Long term (current) use of insulin: Secondary | ICD-10-CM

## 2019-10-13 DIAGNOSIS — I10 Essential (primary) hypertension: Secondary | ICD-10-CM

## 2019-10-13 DIAGNOSIS — I6521 Occlusion and stenosis of right carotid artery: Secondary | ICD-10-CM | POA: Diagnosis not present

## 2019-10-13 DIAGNOSIS — E11319 Type 2 diabetes mellitus with unspecified diabetic retinopathy without macular edema: Secondary | ICD-10-CM

## 2019-10-13 LAB — CBC WITH DIFFERENTIAL/PLATELET
Abs Immature Granulocytes: 0.02 10*3/uL (ref 0.00–0.07)
Basophils Absolute: 0 10*3/uL (ref 0.0–0.1)
Basophils Relative: 1 %
Eosinophils Absolute: 0.5 10*3/uL (ref 0.0–0.5)
Eosinophils Relative: 11 %
HCT: 29.5 % — ABNORMAL LOW (ref 36.0–46.0)
Hemoglobin: 8.5 g/dL — ABNORMAL LOW (ref 12.0–15.0)
Immature Granulocytes: 1 %
Lymphocytes Relative: 19 %
Lymphs Abs: 0.8 10*3/uL (ref 0.7–4.0)
MCH: 28.7 pg (ref 26.0–34.0)
MCHC: 28.8 g/dL — ABNORMAL LOW (ref 30.0–36.0)
MCV: 99.7 fL (ref 80.0–100.0)
Monocytes Absolute: 0.4 10*3/uL (ref 0.1–1.0)
Monocytes Relative: 10 %
Neutro Abs: 2.4 10*3/uL (ref 1.7–7.7)
Neutrophils Relative %: 58 %
Platelets: 310 10*3/uL (ref 150–400)
RBC: 2.96 MIL/uL — ABNORMAL LOW (ref 3.87–5.11)
RDW: 17.2 % — ABNORMAL HIGH (ref 11.5–15.5)
WBC: 4.2 10*3/uL (ref 4.0–10.5)
nRBC: 0 % (ref 0.0–0.2)

## 2019-10-13 LAB — BASIC METABOLIC PANEL
Anion gap: 3 — ABNORMAL LOW (ref 5–15)
BUN: 8 mg/dL (ref 8–23)
CO2: 32 mmol/L (ref 22–32)
Calcium: 8.6 mg/dL — ABNORMAL LOW (ref 8.9–10.3)
Chloride: 103 mmol/L (ref 98–111)
Creatinine, Ser: 0.48 mg/dL (ref 0.44–1.00)
GFR calc Af Amer: >60 mL/min (ref >60)
GFR calc non Af Amer: >60 mL/min (ref >60)
Glucose, Bld: 153 mg/dL — ABNORMAL HIGH (ref 70–99)
Potassium: 3.5 mmol/L (ref 3.5–5.1)
Sodium: 138 mmol/L (ref 135–145)

## 2019-10-13 LAB — GLUCOSE, CAPILLARY
Glucose-Capillary: 144 mg/dL — ABNORMAL HIGH (ref 70–99)
Glucose-Capillary: 170 mg/dL — ABNORMAL HIGH (ref 70–99)
Glucose-Capillary: 166 mg/dL — ABNORMAL HIGH (ref 70–99)
Glucose-Capillary: 89 mg/dL (ref 70–99)
Glucose-Capillary: 156 mg/dL — ABNORMAL HIGH (ref 70–99)
Glucose-Capillary: 195 mg/dL — ABNORMAL HIGH (ref 70–99)

## 2019-10-13 LAB — PHOSPHORUS: Phosphorus: 3.6 mg/dL (ref 2.5–4.6)

## 2019-10-13 LAB — MAGNESIUM: Magnesium: 1.9 mg/dL (ref 1.7–2.4)

## 2019-10-13 NOTE — Progress Notes (Addendum)
NAME:  Donna [name], MRN:  [id number], DOB:  Jun 01, 1950, LOS: 49 ADMISSION DATE:  08/25/2019, CONSULTATION DATE:  08/27/2019 REFERRING MD:  Dr. 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  Admitted by Neuro IR on 6/14 for cerebral angiogram s/p RT ICA cervical/ petrous junction balloon angioplasty for severe stenosis via right femoral approach.  Was placed on ASA/ brillinta.  Monitored in Neuro ICU.  Noted to have developed hypotension, increased left sided weakness and lethargy on 6/15 am with Hgb drop from 13.3 to 6.9 with new AKI.  Post MRI/ MRA showed scattered small acute infarcts along the right MCA/ watershed territory suspected to be resultant either post procedure vs hypoperfusion.    A CT abd/ pelvis was obtained given concern for retroperitoneal bleed which showed a mild to moderate amount of retroperitoneal hematoma in the right iliac and inguinal regiones with moderate size hematoma noted the the soft tissues anterior to the musculature of the right hip; no definite intrapertioneal hemorrhage. INR, PT, and platelets rechecked and were wnl.  Vascular surgery was consulted with plans to observe given bleeding was mostly into the muscle and tissue.  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   7/4 - TRACHEOSTOMY  7/11 - pall  care meeting - full code. Patient feels less congested, ventilatory setting was adjusted to see if she can be liberated from ventilator, avoiding tissue hypoxemia  7/12 - afebrile since 7/8. + 22L since admit. RN feels patient volume overloaded.Does SBT dailly of varying duration few to several hours and then gets fatigued with tachypnea and tachycardia and agitation. Left sided dense hemiplegia continues. Periodically pruposeful on right side PRecedex continues but unsure if patient needs it/ Diarrhea + - on docusate  7/13 - On PSV via trach. Still unable to come off precedex gtt. ? Diarrhea btter after stopping docusate. No labs as part of blood conservation. Got lasix yesterday. Last dose zosyn today. Diarrhea + but improved after dc laxative. Needed prn for high BP last night. Trach sutures removed  7/30 G tube with IR   Consults:  NIR - primary Neurology (stroke has signed off 6/24) Vascular surgery  PCCM Palliative Care Hematology  Procedures:   ETT 6/19 > 6/23,  6/23 > Trach 7/4, 7/30 Gtube  Significant Diagnostic Tests:   6/14 cerebral angiogram s/p RT ICA cer/petrous junction balloon angioplasty for severe stenosis.   6/15 MRA/ MRI brain  > Scattered small acute infarcts along the right MCA/watershed territory. Solitary small acute infarct in the left parietal cortex and right cerebellum. Improved right ICA patency at the petrous segment. Known severe right M1 segment stenosis. 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  6/15 CT a/p wo contrast > Mild to moderate amount of retroperitoneal hematoma is noted in the right iliac and inguinal regions. Moderate size hematoma is noted in the soft tissues anterior to the musculature of the right hip. No  definite intraperitoneal hemorrhage is noted. Moderate size fat containing periumbilical hernia. Aortic Atherosclerosis.   Echo 6/16 > normal LVEF  6/29 MRI brain > Progressed size and confluence of ischemia throughout much  of the right hemisphere white matter since 08/26/2019. Associated petechial blood products but no malignant hemorrhagic transformation or mass Effect. There are also multiple new lacunar type infarcts elsewhere, including the brainstem and the left basal ganglia. Underlying chronic infarcts of the right basal ganglia and left Cerebellum.   Micro Data:  6/10 SARS 2 >> neg 6/14 MRSA PCR >> neg 6/20 trach asp >> few diptheroids/ corynebacterium species 6/18 BC x 2 >> neg 7/6 Morganella 7/28 resp  > morganella morganii   Antimicrobials:  6/14 cefazolin pre-op  6/20 unasyn >> 09/02/2019 7/6 vancomycin > 7/8 7/6 Zosyn>> 7/12 (Morganella)   Interim history/subjective:  NAEO Denies breathing complaints  Very happy affect when PT arrives   Objective   Blood pressure (!) 153/54, pulse 92, temperature 98.7 F (37.1 C), temperature source Oral, resp. rate 22, height 5\' 1"  (1.549 m), weight 77.3 kg, SpO2 100 %.    FiO2 (%):  [28 %] 28 %   Intake/Output Summary (Last 24 hours) at 10/13/2019 0827 Last data filed at 10/12/2019 0909 Gross per 24 hour  Intake 245 ml  Output --  Net 245 ml   Filed Weights   10/11/19 0500 10/12/19 0426 10/13/19 0339  Weight: 74.1 kg 73.5 kg 77.3 kg    Examination: General: Chronically ill appearing obese older adult F, reclined in bed NAD on trach collar  HEENT: #6 shiley without significant secretion. Pink mmm. Anicteric sclera Neuro: Awake alert nodding yes/no  CV: RRR s1s2 no rgm  PULM: CTA bilaterally. Symmetrical chest expansion no accessory use  GI: Soft round ndnt + G tube  Extremities: 2+ peripheral pulses. Lower extremity boots  Skin: c/d/w without rash   Resolved Hospital Problem list   Hypotension  AKI Sepsis VAP - Morganella s/p zosyn ending 09/23/19  Assessment & Plan:    Acute on chronic respiratory failure with hypoxia requiring mechanical ventilation.   -Failed trial of extubation due to upper airway stridor. Cords visibly scarred  on reintubation.  -S/P trach 7/4 -Hypoxia on 7/26 felt to be secondary to secretions. CTA demonstrates some apical GGO possibly representing atypical infection. She has improved with pulmonary hygiene alone. No PE Morganella PNA, recurrent + cx on 7/30. 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  Acute encephalopathy -Continues to slowly show improvement Dysphagia -s/p Gtube placement with IR 7/30 P: Per primary Ongoing PT/OT    8/30 MSN, AGACNP-BC [facility] Tessie Fass If no answer, [phone] 10/13/2019, 8:28 AM

## 2019-10-13 NOTE — Progress Notes (Signed)
Occupational Therapy Treatment Patient Details Name: Donna [name] MRN: [id number] DOB: 1950/07/17 Today's Date: 10/13/2019    History of present illness 69 y.o. female with PMH significant for HTN, HLD, CVA 06/2018, DM, anemia, obesity, OSA, and OA. She is known to [facility] and has been followed by Dr. Corliss Skains since 10/2018. Pt underwent R ICA angioplasty on 6/14. Pt with decline in neuro status and hemoglobin on 6/15, found to have a large R groin hematoma.  Pt has had Hbg 5.0 or less and is a Jehovah's witness.  She was intubated on 6/19. Pt underwent tracheostomy on 7/3. PT found to be septic with RLL PNA on 7/6. 7/13 trach tolerating extubation. Patient had a significant event on 7/25 resulting in respiratory distress, oxygen desaturation, and retroperitoneal hematomas in the right iliac/inguinal regions.   OT comments  Pt with improving alertness, following of one-step commands, and consistent responses to questions. Pt also with improving L UE movement (wrist extension, elbow flexion/extension), though continues to have significant weakness. Pt Total A x 2 for bed mobility, but once sitting EOB, required grossly Min A to maintain sitting balance. Guided pt in L elbow WB activities with improving core muscle activation to correct balance with verbal cues. If pt with same presentation during next session, plan to attempt sit to stand transfers to maximize participation with ADLs.   Follow Up Recommendations  SNF    Equipment Recommendations  Wheelchair cushion (measurements OT);Wheelchair (measurements OT);Hospital bed;Other (comment)    Recommendations for Other Services      Precautions / Restrictions Precautions Precautions: Fall Precaution Comments: trach, low hbg Required Braces or Orthoses: Other Brace Other Brace: L resting hand splint, prevalon boots Restrictions Weight Bearing Restrictions: No       Mobility Bed Mobility Overal bed mobility: Needs Assistance Bed Mobility:  Supine to Sit;Sit to Supine     Supine to sit: Total assist;+2 for physical assistance Sit to supine: Total assist;+2 for physical assistance   General bed mobility comments: Total A for bed mobility, but improving ability to advance B LE to EOB  Transfers                      Balance Overall balance assessment: Needs assistance Sitting-balance support: Single extremity supported;Feet supported Sitting balance-Leahy Scale: Poor Sitting balance - Comments: Sat EOB 15-20 min, requiring grossly Min A to maintain sitting balance EOB. Improving ability to self correct balance with cues Postural control: Posterior lean;Left lateral lean                                 ADL either performed or assessed with clinical judgement   ADL Overall ADL's : Needs assistance/impaired     Grooming: Wash/dry face;Sitting;Set up Grooming Details (indicate cue type and reason): Setup to wash face sitting EOB using R hand Upper Body Bathing: Moderate assistance;Sitting Upper Body Bathing Details (indicate cue type and reason): Assistance to wash back and don lotion due to back itching sitting EOB                           General ADL Comments: Pt with improved alertness and following of commands. Pt with increased lethargy today     Vision   Vision Assessment?: Vision impaired- to be further tested in functional context Additional Comments: Improving ability to scan to L side   Perception  Praxis      Cognition Arousal/Alertness: Awake/alert Behavior During Therapy: Flat affect Overall Cognitive Status: Difficult to assess Area of Impairment: Attention;Following commands;Problem solving;Safety/judgement;Awareness                   Current Attention Level: Sustained   Following Commands: Follows one step commands with increased time;Follows multi-step commands inconsistently Safety/Judgement: Decreased awareness of safety;Decreased awareness of  deficits Awareness: Intellectual Problem Solving: Slow processing;Requires verbal cues;Decreased initiation;Difficulty sequencing;Requires tactile cues General Comments: Pt with improved consistency of following one step commands and responding to questions.         Exercises     Shoulder Instructions       General Comments VSS. pt with improving L sided muscle contractions though significantly weak. Guided pt in L sided WB activities through elbow with Min A to pull self to upright position from L elbow. No edema noted in L hand. Donned L resting hand splint without difficulty    Pertinent Vitals/ Pain       Pain Assessment: Faces Faces Pain Scale: No hurt Pain Intervention(s): Monitored during session  Home Living                                          Prior Functioning/Environment              Frequency  Min 2X/week        Progress Toward Goals  OT Goals(current goals can now be found in the care plan section)  Progress towards OT goals: Progressing toward goals  Acute Rehab OT Goals Patient Stated Goal: none stated OT Goal Formulation: Patient unable to participate in goal setting Time For Goal Achievement: 10/16/19 Potential to Achieve Goals: Fair ADL Goals Pt Will Perform Grooming: with supervision;sitting Pt Will Perform Upper Body Bathing: with mod assist;bed level;sitting Additional ADL Goal #1: Pt will tolerate splint wear Lt UE Additional ADL Goal #2: Pt to demonstrate ability to maintain static sitting balance EOB with no more than min guard for 10 minutes in order to improve endurance for ADLs Additional ADL Goal #3: Pt to scan and identify at least 3 ADL items in L visual field to improve safety and awareness during daily tasks.  Plan Discharge plan remains appropriate    Co-evaluation    PT/OT/SLP Co-Evaluation/Treatment: Yes Reason for Co-Treatment: Complexity of the patient's impairments (multi-system involvement);To  address functional/ADL transfers;For patient/therapist safety;Necessary to address cognition/behavior during functional activity   OT goals addressed during session: ADL's and self-care;Strengthening/ROM      AM-PAC OT "6 Clicks" Daily Activity     Outcome Measure   Help from another person eating meals?: Total (NPO) Help from another person taking care of personal grooming?: A Little Help from another person toileting, which includes using toliet, bedpan, or urinal?: Total Help from another person bathing (including washing, rinsing, drying)?: Total Help from another person to put on and taking off regular upper body clothing?: Total Help from another person to put on and taking off regular lower body clothing?: Total 6 Click Score: 8    End of Session Equipment Utilized During Treatment: Oxygen  OT Visit Diagnosis: Muscle weakness (generalized) (M62.81);Cognitive communication deficit (R41.841);Hemiplegia and hemiparesis Symptoms and signs involving cognitive functions: Cerebral infarction Hemiplegia - Right/Left: Left Hemiplegia - dominant/non-dominant: Non-Dominant Hemiplegia - caused by: Cerebral infarction   Activity Tolerance Patient tolerated treatment well   Patient  Left in bed;with call bell/phone within reach;with SCD's reapplied   Nurse Communication Mobility status;Other (comment) (splint)        Time: 9688-6484 OT Time Calculation (min): 31 min  Charges: OT General Charges $OT Visit: 1 Visit OT Treatments $Therapeutic Activity: 8-22 mins  Lorre Munroe, OTR/L   Lorre Munroe 10/13/2019, 10:02 AM

## 2019-10-13 NOTE — Progress Notes (Signed)
Physical Therapy Treatment Patient Details Name: Donna [name] MRN: [id number] DOB: June 01, 1950 Today's Date: 10/13/2019    History of Present Illness 69 y.o. female with PMH significant for HTN, HLD, CVA 06/2018, DM, anemia, obesity, OSA, and OA. She is known to [facility] and has been followed by Dr. Estanislado Pandy since 10/2018. Pt underwent R ICA angioplasty on 6/14. Pt with decline in neuro status and hemoglobin on 6/15, found to have a large R groin hematoma.  Pt has had Hbg 5.0 or less and is a Jehovah's witness.  She was intubated on 6/19. Pt underwent tracheostomy on 7/3. PT found to be septic with RLL PNA on 7/6. 7/13 trach tolerating extubation. Patient had a significant event on 7/25 resulting in respiratory distress, oxygen desaturation, and retroperitoneal hematomas in the right iliac/inguinal regions.    PT Comments    Patient received in bed, in great spirits today and smiling at therapists when we entered room. Continues to be a high level of physical assist- see below for details- but once at EOB demonstrates improved core strength and ability to activate core musculature, also tolerated sitting at EOB for longer today. Shows great improvement in activation of L LE musculature today as well especially hip flexors and quad but remains very weak on hemiparetic side in general. Followed cues well today. Continues to fatigue easily and often with posterior and L lean but able to correct with verbal cues, extended time, and MinA. Left positioned to comfort in bed with all needs met, bed alarm active.     Follow Up Recommendations  SNF     Equipment Recommendations  Wheelchair (measurements PT);Wheelchair cushion (measurements PT);Hospital bed    Recommendations for Other Services       Precautions / Restrictions Precautions Precautions: Fall Precaution Comments: trach, low hbg, PEG Required Braces or Orthoses: Other Brace Other Brace: L resting hand splint, prevalon  boots Restrictions Weight Bearing Restrictions: No    Mobility  Bed Mobility Overal bed mobility: Needs Assistance Bed Mobility: Supine to Sit;Sit to Supine     Supine to sit: Total assist;+2 for physical assistance Sit to supine: Total assist;+2 for physical assistance   General bed mobility comments: Total A for bed mobility, but improving ability to advance B LE to EOB and able to activate L hip flexor and quad more to help  Transfers                 General transfer comment: deferred- fatigue  Ambulation/Gait             General Gait Details: deferred- fatigue   Stairs             Wheelchair Mobility    Modified Rankin (Stroke Patients Only)       Balance Overall balance assessment: Needs assistance Sitting-balance support: Single extremity supported;Feet supported Sitting balance-Leahy Scale: Poor Sitting balance - Comments: Sat EOB 15-20 min, requiring grossly Min A to maintain sitting balance EOB. Improving ability to self correct balance with cues Postural control: Posterior lean;Left lateral lean                                  Cognition Arousal/Alertness: Awake/alert Behavior During Therapy: Flat affect Overall Cognitive Status: Difficult to assess Area of Impairment: Attention;Following commands;Problem solving;Safety/judgement;Awareness                   Current Attention Level: Sustained   Following Commands:  Follows one step commands with increased time;Follows multi-step commands inconsistently Safety/Judgement: Decreased awareness of safety;Decreased awareness of deficits Awareness: Intellectual Problem Solving: Slow processing;Requires verbal cues;Decreased initiation;Difficulty sequencing;Requires tactile cues General Comments: Pt with improved consistency of following one step commands and responding to questions.       Exercises      General Comments General comments (skin integrity, edema, etc.):  VSS, pt with improving L sided muscle contractions but remains weak. Guided thru L sided WB ativities onto elbow with MinA to pull self up to upright. Improving sitting balance and core activation. consistent cue followig today!      Pertinent Vitals/Pain Pain Assessment: Faces Pain Score: 0-No pain Faces Pain Scale: No hurt Pain Intervention(s): Monitored during session;Limited activity within patient's tolerance    Home Living                      Prior Function            PT Goals (current goals can now be found in the care plan section) Acute Rehab PT Goals Patient Stated Goal: none stated PT Goal Formulation: Patient unable to participate in goal setting Time For Goal Achievement: 10/23/19 Potential to Achieve Goals: Fair Progress towards PT goals: Progressing toward goals    Frequency    Min 3X/week      PT Plan Current plan remains appropriate    Co-evaluation   Reason for Co-Treatment: Complexity of the patient's impairments (multi-system involvement);To address functional/ADL transfers;For patient/therapist safety;Necessary to address cognition/behavior during functional activity   OT goals addressed during session: ADL's and self-care;Strengthening/ROM      AM-PAC PT "6 Clicks" Mobility   Outcome Measure  Help needed turning from your back to your side while in a flat bed without using bedrails?: Total Help needed moving from lying on your back to sitting on the side of a flat bed without using bedrails?: Total Help needed moving to and from a bed to a chair (including a wheelchair)?: Total Help needed standing up from a chair using your arms (e.g., wheelchair or bedside chair)?: Total Help needed to walk in hospital room?: Total Help needed climbing 3-5 steps with a railing? : Total 6 Click Score: 6    End of Session Equipment Utilized During Treatment: Oxygen (trach) Activity Tolerance: Patient tolerated treatment well Patient left: in  bed;with SCD's reapplied;with call bell/phone within reach;with bed alarm set;with family/visitor present   PT Visit Diagnosis: Other abnormalities of gait and mobility (R26.89);Muscle weakness (generalized) (M62.81);Other symptoms and signs involving the nervous system (R29.898);Hemiplegia and hemiparesis Hemiplegia - Right/Left: Left Hemiplegia - dominant/non-dominant: Non-dominant Hemiplegia - caused by: Cerebral infarction     Time: 0258-5277 PT Time Calculation (min) (ACUTE ONLY): 31 min  Charges:  $Neuromuscular Re-education: 8-22 mins (co-tx with OT)                     Windell Norfolk, DPT, PN1   Supplemental Physical Therapist [place]    Pager [phone] Acute Rehab Office [phone]

## 2019-10-13 NOTE — Chronic Care Management (AMB) (Signed)
°  Chronic Care Management   Inpatient Admit Review Note  10/13/2019 Name: Donna [name] MRN: [id number] DOB: 11/30/1950  Donna [name] is a 69 y.o. year old female who is a primary care patient of Arnette Felts, FNP. Donna [name] is actively engaged with the embedded care management team in the primary care practice and is being followed by RN Case Manager and Pharmacist for assistance with disease management and care coordination needs related to HTN and DMII.   Patient remains inpatient at [facility]. Current plan is to discharge to a SNF once a bed is available and insurance authorization is completed.   Plan: Collaboration with RN Care Manager regarding patient disposition. Embedded care management team will continue to follow.   Bevelyn Ngo, BSW, CDP Social Worker, Certified Dementia Practitioner [facility] Care Management [phone]

## 2019-10-13 NOTE — Progress Notes (Signed)
PROGRESS NOTE  Donna [name]  DOB: 08/27/1950  PCP: Moore, [name], FNP MRN:[id number]  DOA: 08/25/2019  LOS: 49 days   No chief complaint on file.  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  6/14, patient was admitted by Neuro IR and underwent cerebral angiogram s/p RT ICA cervical/ petrous junction balloon angioplasty for severe stenosis via right femoral approach. Postprocedure, patient was placed on ASA/ brillinta and monitored in Neuro ICU.  6/15, noted hypotension, increased left sided weakness, lethargy and left-sided neglect. Hgb drop from 13.3 to 6.9 with new AKI. Post MRI/ MRA showed scattered small acute infarcts along the right MCA/ watershed territory suspected to be resultant either post procedure vs hypoperfusion. Continues to have left-sided neglect. 6/15, CT abd/ pelvis was obtained given concern for retroperitoneal bleed which showed a mild to moderate amount of retroperitoneal hematoma in the right iliac and inguinal regiones with moderate size hematoma noted the the soft tissues anterior to the musculature of the right hip; no definite intrapertioneal hemorrhage. INR, PT, and platelets rechecked and were wnl. Vascular surgery was consulted.  No intervention done by vascular given bleeding was mostly into the muscle and tissue.  Patient has had long hospitalization since then.  6/18 -intubated, reintubated 6/23 and 6/28 for failed trial of extubation. 7/3 -underwent tracheostomy 7/25-7/26, patient developed respiratory distress with significant desaturation. CXR did not show any acute finding. Tube feed was kept on held given concern for aspiration. Tracheal aspiration was performed.  Received high-dose Solu-Medrol and Lasix.  PCCM consulted.   CTA chest negative for PE but showed GGO and nodular opacities in bilateral upper lobes.  7/30, patient underwent successful PEG tube placement by IR  Subjective: Patient was seen and examined this morning.   Not in distress.  On 5 L oxygen by trach collar.  Smiles on my presence.  Able to follow motor commands.  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  -No anticoagulation or antiplatelet because of bleeding -Neurology signed off. -Continues to have left-sided weakness, gradually improving with physical therapy.  Acutemetabolicencephalopathy -Improved. Responds to questions by nodding and follows motor command. -cotninue seroquel at 100mg QHS (has helped come off precedex) -monitor QTc intermittently.  Dysphagia:  -due to CVA. -7/30, patient underwent successful PEG tube placement by IR -Nutrition consult appreciated.  PEG tube feeding started.. -Continue aspiration precautions.  Acute on chronic respiratory failure with hypoxia:  Aspiration pneumonia. -Currently on trach collar at 5 L/min.  -7/25-7/26, patient developed respiratory distress with significant desaturation. CXR did not show any acute finding. Tube feed was kept on held given concern for aspiration. Tracheal aspiration was performed.  Received high-dose Solu-Medrol and Lasix. PCCM consulted. CTA chest negative for PE but showed GGO and nodular opacities in bilateral upper lobes.  -Tracheal aspirate from 7/28 is showing moderate growth of Morganella.  Likely colonization. -Respiratory status improved.  Continue Pulmicort and DuoNeb.  Not on antibiotics. -Appreciate PCCM input.  Volume Overload Essential hypertension -Appears euvolemic on exam. Intermittently received IV Lasix. -Goal to keep SBP<160 given  intracranial stenosis. -Currently on amlodipine 10 mg daily and hydralazine 100 mg every 6 hours.  Acute blood loss anemia  Anemia   of critical illness:  -Jehovah's Witness, no blood products including albumin. -Continue severe anemia protocol: Iron, Folic acid, vitamin Z61, Monitor off Epogen and Vit C that was previously given -Intermittently monitor H&H.  Hemoglobin on last blood work on 7/30 was 8.4. -Avoid blood draws if possible  Diabetes With hyperglycemia Lab Results  Component Value Date   HGBA1C 5.2 10/03/2019   -Currently blood sugar trending less than 200 on Levemir 12 units and sliding scale insulin.  Continue the same with Accu-Cheks.  Debility/physical deconditioning -Continue PT/OT when medically stable.  Mobility: PT evaluation Code Status:   Code Status: Full Code  Nutritional status: Body mass index is 32.2 kg/m. Nutrition Problem: Inadequate oral intake Etiology: inability to eat Signs/Symptoms: NPO status Diet Order            Diet NPO time specified  Diet effective now                 DVT prophylaxis: Place and maintain sequential compression device Start: 09/26/19 1319   Antimicrobials:  Currently on none Fluid: None  Consultants: PCCM, neuro IR, IR, Family Communication:  None at bedside  Status is: Inpatient  Remains inpatient appropriate because: Pending SNF placement   Dispo: The patient is from: Home              Anticipated d/c is to: SNF              Anticipated d/c date is: Whenever bed/insurance authorization available              Patient currently is medically stable to d/c.   Infusions:  . sodium chloride Stopped (10/09/19 0900)  . dextrose 5 % and 0.45% NaCl 50 mL/hr at 10/13/19 1000  . feeding supplement (JEVITY 1.2 CAL) Stopped (10/12/19 1557)    Scheduled Meds: . amLODipine  10 mg Per Tube Daily  . atorvastatin  40 mg Per NG tube Daily  . budesonide (PULMICORT) nebulizer solution  0.5 mg Nebulization BID  .  chlorhexidine gluconate (MEDLINE KIT)  15 mL Mouth Rinse BID  . Chlorhexidine Gluconate Cloth  6 each Topical Daily  . famotidine  20 mg Per Tube BID  . feeding supplement (PROSource TF)  45 mL Per Tube TID  . ferrous sulfate  220 mg Per Tube TID WC  . hydrALAZINE  100 mg Per Tube Q6H  . hydrocerin   Topical TID  . insulin aspart  0-15 Units Subcutaneous Q4H  . insulin aspart  4 Units Subcutaneous Q4H  . insulin glargine  12 Units Subcutaneous Daily  . ipratropium-albuterol  3 mL Nebulization TID  . mouth rinse  15 mL Mouth Rinse 10 times per day  . QUEtiapine  100 mg Per Tube QHS  . sodium chloride flush  10-40 mL Intracatheter Q12H    Antimicrobials: Anti-infectives (From admission, onward)   Start     Dose/Rate Route Frequency Ordered Stop   10/10/19 1357  ceFAZolin (ANCEF) 2-4 GM/100ML-% IVPB       Note to Pharmacy: Leak, Brandi   : cabinet override      10/10/19 1357 10/10/19 1410   10/10/19 0830  ceFAZolin (ANCEF) 2-4 GM/100ML-% IVPB       Note to Pharmacy: Domenick Bookbinder   : cabinet override      10/10/19 0830 10/10/19 2044   09/16/19 2130  vancomycin (VANCOCIN) IVPB 1000 mg/200 mL premix  Status:  Discontinued        1,000 mg 200 mL/hr over 60 Minutes  Intravenous Every 12 hours 09/16/19 0839 09/18/19 0756   09/16/19 1630  piperacillin-tazobactam (ZOSYN) IVPB 3.375 g        3.375 g 12.5 mL/hr over 240 Minutes Intravenous Every 8 hours 09/16/19 0835 09/23/19 1153   09/16/19 1000  ceFEPIme (MAXIPIME) 1 g in sodium chloride 0.9 % 100 mL IVPB  Status:  Discontinued        1 g 200 mL/hr over 30 Minutes Intravenous Every 12 hours 09/16/19 0756 09/16/19 0803   09/16/19 0845  piperacillin-tazobactam (ZOSYN) IVPB 3.375 g        3.375 g 100 mL/hr over 30 Minutes Intravenous  Once 09/16/19 0834 09/16/19 1007   09/16/19 0845  vancomycin (VANCOREADY) IVPB 1500 mg/300 mL        1,500 mg 150 mL/hr over 120 Minutes Intravenous  Once 09/16/19 0839 09/16/19 1248   09/16/19 0830   piperacillin-tazobactam (ZOSYN) IVPB 3.375 g  Status:  Discontinued        3.375 g 12.5 mL/hr over 240 Minutes Intravenous Every 8 hours 09/16/19 0803 09/16/19 0834   09/16/19 0800  vancomycin (VANCOCIN) IVPB 1000 mg/200 mL premix  Status:  Discontinued        1,000 mg 200 mL/hr over 60 Minutes Intravenous Every 12 hours 09/16/19 0756 09/16/19 0839   08/31/19 1400  Ampicillin-Sulbactam (UNASYN) 3 g in sodium chloride 0.9 % 100 mL IVPB  Status:  Discontinued        3 g 200 mL/hr over 30 Minutes Intravenous Every 6 hours 08/31/19 1357 09/02/19 0819   08/25/19 0723  ceFAZolin (ANCEF) IVPB 2g/100 mL premix        2 g 200 mL/hr over 30 Minutes Intravenous 60 min pre-op 08/25/19 0723 08/25/19 0930      PRN meds: acetaminophen **OR** acetaminophen (TYLENOL) oral liquid 160 mg/5 mL **OR** acetaminophen, albuterol, fentaNYL (SUBLIMAZE) injection, hydrALAZINE, iohexol, labetalol, lip balm, oxyCODONE, sodium chloride flush   Objective: Vitals:   10/13/19 1000 10/13/19 1156  BP:    Pulse: 99 98  Resp: 17 15  Temp:    SpO2: 100% 100%    Intake/Output Summary (Last 24 hours) at 10/13/2019 1323 Last data filed at 10/13/2019 1000 Gross per 24 hour  Intake 1207.4 ml  Output --  Net 1207.4 ml   Filed Weights   10/11/19 0500 10/12/19 0426 10/13/19 0339  Weight: 74.1 kg 73.5 kg 77.3 kg   Weight change: 3.8 kg Body mass index is 32.2 kg/m.   Physical Exam: General exam: Appears calm and comfortable.  Not in acute physical distress Skin: No rashes, lesions or ulcers. HEENT: Has a trach collar anteriorly.  On 5 L oxygen by nasal cannula. Lungs: Clear to auscultate bilaterally.  No crackles or wheezing. CVS: Regular rate and rhythm, no murmur GI/Abd soft, nontender, nondistended, bowel sound present.  PEG tube site intact. CNS: Alert, awake, able to follow motor commands to move right side.  Psychiatry: Mood and affect normal Extremities: No pedal edema, no calf tenderness. Legs on boots to  prevent pressure ulcers.  Data Review: I have personally reviewed the laboratory data and studies available.  Recent Labs  Lab 10/07/19 0617 10/09/19 0731 10/10/19 0507 10/13/19 0614  WBC 7.0 4.8 5.2 4.2  NEUTROABS  --   --  3.4 2.4  HGB 6.6* 7.6* 8.4* 8.5*  HCT 23.5* 27.4* 29.0* 29.5*  MCV 101.7* 103.0* 99.7 99.7  PLT 262 274 307 310   Recent Labs  Lab 10/10/19 0507 10/13/19 0614  NA 141 138    K 3.3* 3.5  CL 104 103  CO2 29 32  GLUCOSE 145* 153*  BUN <5* 8  CREATININE 0.55 0.48  CALCIUM 9.1 8.6*  MG 1.8 1.9  PHOS 2.9 3.6   Lab Results  Component Value Date   HGBA1C 5.2 10/03/2019       Component Value Date/Time   CHOL 217 (H) 05/22/2019 1312   TRIG 114 05/22/2019 1312   HDL 65 05/22/2019 1312   CHOLHDL 3.3 05/22/2019 1312   LDLCALC 132 (H) 05/22/2019 1312   LABVLDL 20 05/22/2019 1312    Signed,  , MD [facility] Pager: [phone] (Secure Chat preferred). 10/13/2019            

## 2019-10-13 NOTE — Progress Notes (Signed)
  Speech Language Pathology Treatment: Hillary Bow Speaking valve  Patient Details Name: Donna [name] MRN: [id number] DOB: 1951-02-29 Today's Date: 10/13/2019 Time: 1455-1510 SLP Time Calculation (min) (ACUTE ONLY): 15 min  Assessment / Plan / Recommendation Clinical Impression  Pt did very well with PMV in place today.  VS stable for 15 minute use and during check-ins after session was finished. Sp02 remained at 100%.  Has cuffless #6 and is moving air well around trach.  Achieved much better quality phonation today with good volume.  She had improved length of utterance per expiratory cycle. She answered multiple questions about her family, smiling, with more natural prosodic variation in her speech.  She needed cues to verbalize rather than defaulting to gesture or yes/no head nods. Her sister, Scarlette Calico, was present and was taught how to place/remove valve as well as the parameters under which valve should be removed.  She demonstrated independence.   Recommend pt begin using valve with more frequency - still requires full supervision.  RN/OT/PT and Cyndia Bent may place valve whenever they are with pt.  Please remove upon departure and leaving pt alone.  SLP will follow for communication and for resumption of POs to supplement PEG.    HPI HPI: 69 y.o. female with PMH significant for HTN, HLD, CVA 06/2018, DM, anemia, obesity, OSA, and OA. Pt underwent R ICA angioplasty on 6/14. Pt with decline in neuro status and hemoglobin on 6/15, found to have a large R groin hematoma.  Pt has had Hbg 5.0 or less and is a Jehovah's witness. MRI showed scattered small acute infarcts along the right MCA/watershed territory. Solitary small acute infarct in the left parietal cortex and right cerebellum. She was intubated on 6/19. Pt underwent tracheostomy on 7/3. PT found to be septic with RLL PNA on 7/6.  MBS 7/22 - started on dys1/nectars.  Patient now with a Shiley #6 cuffless trach in place.  She has planned PEG  placement 7/30.        SLP Plan  Continue with current plan of care       Recommendations         Patient may use Passy-Muir Speech Valve: During all therapies with supervision;Caregiver trained to provide supervision PMSV Supervision: Full         Oral Care Recommendations: Oral care QID Follow up Recommendations: Skilled Nursing facility;24 hour supervision/assistance SLP Visit Diagnosis: Aphonia (R49.1) Plan: Continue with current plan of care       GO                Blenda Mounts Laurice 10/13/2019, 3:23 PM Lorane Cousar L. Samson Frederic, MA CCC/SLP Acute Rehabilitation Services Office number [phone] Pager [phone]

## 2019-10-13 NOTE — Chronic Care Management (AMB) (Signed)
Chronic Care Management Pharmacy Assistant   Name: Donna [name]  MRN: [id number] DOB: 02-09-1951  Reason for Encounter: Medication Review   PCP : Minette Brine, FNP  Allergies:  No Known Allergies  Medications: Outpatient Encounter Medications as of 10/13/2019  Medication Sig Note  . amLODipine (NORVASC) 10 MG tablet Take 1 tablet (10 mg total) by mouth daily.   . blood glucose meter kit and supplies KIT Dispense based on patient and insurance preference. Use up to four times daily as directed. (FOR ICD-9 250.00, 250.01). 06/13/2018: Bought ReliON meter from [facility]    . Blood Glucose Monitoring Suppl (TRUE METRIX METER) w/Device KIT 1 each by Does not apply route in the morning, at noon, in the evening, and at bedtime.   . Calcium Carbonate-Vitamin D (CALCIUM-D PO) Take 2 tablets by mouth daily at 12 noon.    . Evolocumab (REPATHA SURECLICK) 213 MG/ML SOAJ Inject 140 mg into the skin every 14 (fourteen) days. 08/06/2019: Pt has not started med   . glucose blood (TRUE METRIX BLOOD GLUCOSE TEST) test strip Check blood sugar 4 times a day before meals and bedtime   . Multiple Vitamin (MULTIVITAMIN PO) Take 1 tablet by mouth daily at 12 noon.    . Omega-3 Fatty Acids (OMEGA-3 PLUS PO) Take 2 capsules by mouth daily at 12 noon.   . vitamin C (ASCORBIC ACID) 250 MG tablet Take 500 mg by mouth daily at 12 noon.   . [DISCONTINUED] aspirin EC 81 MG tablet Take 81 mg by mouth every evening.   . [DISCONTINUED] carvedilol (COREG) 6.25 MG tablet TAKE 1 TABLET(6.25 MG) BY MOUTH TWICE DAILY (Patient taking differently: Take 6.25 mg by mouth 2 (two) times daily with a meal. )   . [DISCONTINUED] hydrALAZINE (APRESOLINE) 50 MG tablet Take 1 tablet (50 mg total) by mouth 2 (two) times daily.   . [DISCONTINUED] Insulin Degludec-Liraglutide (XULTOPHY) 100-3.6 UNIT-MG/ML SOPN Inject 30 Units into the skin daily.   . [DISCONTINUED] Insulin Glargine-Lixisenatide (SOLIQUA) 100-33 UNT-MCG/ML SOPN Inject 25  Units into the skin daily. (Patient taking differently: Inject 30 Units into the skin daily. )   . [DISCONTINUED] telmisartan-hydrochlorothiazide (MICARDIS HCT) 40-12.5 MG tablet Take 1 tablet by mouth daily.   . [DISCONTINUED] ticagrelor (BRILINTA) 90 MG TABS tablet Take 1 tablet (90 mg total) by mouth 2 (two) times daily.   . [DISCONTINUED] 0.9 %  sodium chloride infusion    . [DISCONTINUED] acetaminophen (TYLENOL) 160 MG/5ML solution 650 mg    . [DISCONTINUED] acetaminophen (TYLENOL) suppository 650 mg    . [DISCONTINUED] acetaminophen (TYLENOL) tablet 650 mg    . [DISCONTINUED] albuterol (PROVENTIL) (2.5 MG/3ML) 0.083% nebulizer solution 2.5 mg    . [DISCONTINUED] amLODipine (NORVASC) tablet 10 mg    . [DISCONTINUED] atorvastatin (LIPITOR) tablet 40 mg    . [DISCONTINUED] budesonide (PULMICORT) nebulizer solution 0.5 mg    . [DISCONTINUED] chlorhexidine gluconate (MEDLINE KIT) (PERIDEX) 0.12 % solution 15 mL    . [DISCONTINUED] Chlorhexidine Gluconate Cloth 2 % PADS 6 each    . [DISCONTINUED] dextrose 5 %-0.45 % sodium chloride infusion    . [DISCONTINUED] famotidine (PEPCID) tablet 20 mg    . [DISCONTINUED] feeding supplement (JEVITY 1.2 CAL) liquid 1,000 mL    . [DISCONTINUED] feeding supplement (PROSource TF) liquid 45 mL    . [DISCONTINUED] fentaNYL (SUBLIMAZE) injection 25-100 mcg    . [DISCONTINUED] ferrous sulfate 220 (44 Fe) MG/5ML solution 220 mg    . [DISCONTINUED] hydrALAZINE (APRESOLINE) injection 10-20  mg    . [DISCONTINUED] hydrALAZINE (APRESOLINE) tablet 100 mg    . [DISCONTINUED] hydrocerin (EUCERIN) cream    . [DISCONTINUED] insulin aspart (novoLOG) injection 0-15 Units    . [DISCONTINUED] insulin aspart (novoLOG) injection 4 Units    . [DISCONTINUED] insulin glargine (LANTUS) injection 12 Units    . [DISCONTINUED] iohexol (OMNIPAQUE) 300 MG/ML solution 50 mL    . [DISCONTINUED] ipratropium-albuterol (DUONEB) 0.5-2.5 (3) MG/3ML nebulizer solution 3 mL    .  [DISCONTINUED] labetalol (NORMODYNE) injection 10 mg    . [DISCONTINUED] lip balm (CARMEX) ointment    . [DISCONTINUED] MEDLINE mouth rinse    . [DISCONTINUED] oxyCODONE (Oxy IR/ROXICODONE) immediate release tablet 5 mg    . [DISCONTINUED] QUEtiapine (SEROQUEL) tablet 100 mg    . [DISCONTINUED] sodium chloride flush (NS) 0.9 % injection 10-40 mL    . [DISCONTINUED] sodium chloride flush (NS) 0.9 % injection 10-40 mL     No facility-administered encounter medications on file as of 10/13/2019.    Current Diagnosis: Patient Active Problem List   Diagnosis Date Noted  . Pressure injury of skin 10/17/2019  . Status post tracheostomy ([place])   . Hypokalemia   . Hypoxia   . Sepsis with acute hypoxic respiratory failure without septic shock ([place])   . Palliative care by specialist   . Goals of care, counseling/discussion   . Acute blood loss anemia   . Acute respiratory failure ([place])   . AKI (acute kidney injury) ([place]) 08/26/2019  . Anemia 08/26/2019  . Internal carotid artery stenosis, right 08/25/2019  . Pain due to onychomycosis of toenails of both feet 08/06/2019  . Diabetic neuropathy ([place]) 08/06/2019  . Type 2 diabetes mellitus with vascular disease ([place]) 08/06/2019  . Statin myopathy 06/26/2019  . COVID-19 virus infection 03/27/2019  . Pre-operative cardiovascular examination 03/04/2019  . History of CVA (cerebrovascular accident) 03/04/2019  . PVD (peripheral vascular disease) ([place]) 03/04/2019  . Snoring 10/29/2018  . Right-sided cerebrovascular accident (CVA) ([place]) 08/30/2018  . Non compliance w medication regimen 08/30/2018  . Atherosclerotic cerebrovascular disease 08/30/2018  . At high risk for stroke 08/30/2018  . Cerebral infarction ([place]) 07/09/2018  . Hyperlipidemia 07/08/2018  . Fall 07/08/2018  . Muscle weakness (generalized) 04/04/2018  . Fatigue 04/04/2018  . Diabetic retinopathy ([place]) 03/28/2018  . Type 2 diabetes mellitus ([place]) 12/09/2017  . Essential  hypertension 12/09/2017  . Carotid artery disease ([place]) 12/17/2013      Follow-Up:  Patient Assistance Coordination - Filled out Reorder/Refill form for patient assistance on patients Xultophy 30 units daily. Printed for Minette Brine, FNP to sign. Patient has been in the hospital since the beginning of July, patient recently transferred [facility] SNF for [facility] term care. Calling facility to follow up on CCM care needs for medications. [facility]/21[facility], spoke with nurse, she informed me that patient was discharged from facility back to hospital on 10/27/2019 and shortly after that she passed. Jannette Fogo, CPP notified.   Pattricia Boss, [place] Pharmacist Assistant [phone]

## 2019-10-13 NOTE — Progress Notes (Signed)
Nutrition Follow-up  DOCUMENTATION CODES:   Not applicable  INTERVENTION:   Continue tube feeding: - Jevity 1.2 @ 50 ml/hr (1200 ml) via PEG - ProSource TF 45 ml TID  Tube feeding regimen provides 1560 kcal, 99 grams of protein, and 973 ml of H2O. Meets 100% of needs.   NUTRITION DIAGNOSIS:   Inadequate oral intake related to inability to eat as evidenced by NPO status.  Ongoing  GOAL:   Patient will meet greater than or equal to 90% of their needs  Addressed via TF  MONITOR:   PO intake, Supplement acceptance, Diet advancement, Labs, Weight trends, TF tolerance  REASON FOR ASSESSMENT:   Consult Enteral/tube feeding initiation and management  ASSESSMENT:   Pt who is a Jehovah witness with PMH of HTN, HLD, R MCA CVA 06/2018, DM, anemia, obesity, OSA admitted 6/14 for cerebral angiogram s/p R ICA angioplasty for severe stenosis.  6/14 - s/p balloon angioplasty for severe stenosis, pt developed lethargy and increased L sided weakness found to have new watershed infarcts, started on vasopressors for cerebral perfusion 6/16 - cortrak placed, tip in stomach 6/17 - pt with increased agitation requiring increased O2 6/18 - pt pulled out Cortrak, Cortrak replaced 6/19 - Cortrak removed 6/20 - intubated 6/23 - Cortrak replaced (tip gastric per Cortrak team), extubated, later reintubated 7/3 - s/p trach  7/30- s/p PEG  Oxygen requirement decreasing, may be able to progress to RA. Tolerating Jevity 1.2 @ 45 ml/hr. Remains NPO. No BM x4 days. No regimen in place. Increase TF to goal.   Admission weight: 77.5 kg  Current weight: 77.3 kg   Drips: D5 in 1/2NS @ 50 ml/hr  Medications: SS novolog, lantus Labs: CBG [id number]  Diet Order:   Diet Order            Diet NPO time specified  Diet effective now                 EDUCATION NEEDS:   Not appropriate for education at this time  Skin:  Skin Assessment: Skin Integrity Issues: Incisions: right groin MASD:  buttocks, R groin  Last BM:  7/28  Height:   Ht Readings from Last 1 Encounters:  08/31/19 5\' 1"  (1.549 m)    Weight:   Wt Readings from Last 1 Encounters:  10/13/19 77.3 kg    Ideal Body Weight:  47.7 kg  BMI:  Body mass index is 32.2 kg/m.  Estimated Nutritional Needs:   Kcal:  1550-1750  Protein:  90-105 grams  Fluid:  >1.6 L/day  12/13/19 RD, LDN Clinical Nutrition Pager listed in AMION

## 2019-10-14 DIAGNOSIS — I6521 Occlusion and stenosis of right carotid artery: Secondary | ICD-10-CM | POA: Diagnosis not present

## 2019-10-14 LAB — GLUCOSE, CAPILLARY
Glucose-Capillary: 167 mg/dL — ABNORMAL HIGH (ref 70–99)
Glucose-Capillary: 167 mg/dL — ABNORMAL HIGH (ref 70–99)
Glucose-Capillary: 182 mg/dL — ABNORMAL HIGH (ref 70–99)
Glucose-Capillary: 182 mg/dL — ABNORMAL HIGH (ref 70–99)
Glucose-Capillary: 135 mg/dL — ABNORMAL HIGH (ref 70–99)
Glucose-Capillary: 124 mg/dL — ABNORMAL HIGH (ref 70–99)
Glucose-Capillary: 213 mg/dL — ABNORMAL HIGH (ref 70–99)
Glucose-Capillary: 174 mg/dL — ABNORMAL HIGH (ref 70–99)

## 2019-10-14 NOTE — [place] FL2 (Signed)
[place] MEDICAID FL2 LEVEL OF CARE SCREENING TOOL     IDENTIFICATION  Patient Name: Donna [name] Birthdate: 04-28-1950 Sex: female Admission Date (Current Location): 08/25/2019  [facility] [place] Number:  Herbalist and Address:  [facility] [place], [place] [address] [place], [place] [address]      Provider Number: [id number]  Attending Physician Name and Address:  Terrilee Croak, MD  Relative Name and Phone Number:  Keirstyn Aydt - son; [phone]    Current Level of Care: Hospital Recommended Level of Care: [place] Prior Approval Number:    Date Approved/Denied:   PASRR Number:  (PASRR number pending)  Discharge Plan: SNF    Current Diagnoses: Patient Active Problem List   Diagnosis Date Noted  . Status post tracheostomy ([place])   . Hypokalemia   . Hypoxia   . Sepsis with acute hypoxic respiratory failure without septic shock ([place])   . Palliative care by specialist   . Goals of care, counseling/discussion   . Acute blood loss anemia   . Acute respiratory failure ([place])   . AKI (acute kidney injury) ([place]) 08/26/2019  . Anemia 08/26/2019  . Internal carotid artery stenosis, right 08/25/2019  . Pain due to onychomycosis of toenails of both feet 08/06/2019  . Diabetic neuropathy ([place]) 08/06/2019  . Type 2 diabetes mellitus with vascular disease ([place]) 08/06/2019  . Statin myopathy 06/26/2019  . COVID-19 virus infection 03/27/2019  . Pre-operative cardiovascular examination 03/04/2019  . History of CVA (cerebrovascular accident) 03/04/2019  . PVD (peripheral vascular disease) ([place]) 03/04/2019  . Snoring 10/29/2018  . Right-sided cerebrovascular accident (CVA) ([place]) 08/30/2018  . Non compliance w medication regimen 08/30/2018  . Atherosclerotic cerebrovascular disease 08/30/2018  . At high risk for stroke 08/30/2018  . Cerebral infarction ([place]) 07/09/2018  . Hyperlipidemia 07/08/2018  . Fall 07/08/2018  . Muscle  weakness (generalized) 04/04/2018  . Fatigue 04/04/2018  . Diabetic retinopathy ([place]) 03/28/2018  . Type 2 diabetes mellitus ([place]) 12/09/2017  . Essential hypertension 12/09/2017  . Carotid artery disease ([place]) 12/17/2013    Orientation RESPIRATION BLADDER Height & Weight     Self  Tracheostomy (Trach placed 09/30/19. [place]iley 20m uncuffed; 5L/min.) External catheter, Incontinent (Catheter placed 08/30/19) Weight: 169 lb 12.1 oz (77 kg) Height:  '5\' 1"'$  (154.9 cm)  BEHAVIORAL SYMPTOMS/MOOD NEUROLOGICAL BOWEL NUTRITION STATUS      Continent Feeding tube (PEG tube placed 7/30. Tube size: 20 Fr; Location LUQ. Jevity 1.2 CAL-Liquid 1,000; Per Tube 50 mL/hr continuous)  AMBULATORY STATUS COMMUNICATION OF NEEDS Skin   Total Care Verbally Other (Comment) (MASD left/right groin)                       Personal Care Assistance Level of Assistance  Bathing, Feeding, Dressing Bathing Assistance: Maximum assistance Feeding assistance: Maximum assistance (Patient has PEG Tube, placed 10/10/19) Dressing Assistance: Maximum assistance     Functional Limitations Info  Sight, Hearing, Speech Sight Info: Impaired Hearing Info: Adequate Speech Info: Adequate    SPECIAL CARE FACTORS FREQUENCY  PT (By licensed PT), OT (By licensed OT)     PT Frequency: PT at SNF eval and treat, a minmum of 5 days per week OT Frequency: OT at SNF eval and treat, a minimum of 5 days per week            Contractures Contractures Info: Not present    Additional Factors Info  Code Status, Allergies, Insulin Sliding Scale Code  Status Info: Full Allergies Info: No known allergies   Insulin Sliding Scale Info: 0-15 units every 4 hours       Current Medications (10/14/2019):  This is the current hospital active medication list Current Facility-Administered Medications  Medication Dose Route Frequency Provider Last Rate Last Admin  . 0.9 %  sodium chloride infusion  250 mL Intravenous Continuous Cheryll Dessert, MD   Stopped at 10/09/19 0900  . acetaminophen (TYLENOL) tablet 650 mg  650 mg Oral Q4H PRN Luanne Bras, MD   650 mg at 09/27/19 0934   Or  . acetaminophen (TYLENOL) 160 MG/5ML solution 650 mg  650 mg Per Tube Q4H PRN Luanne Bras, MD   650 mg at 10/12/19 2131   Or  . acetaminophen (TYLENOL) suppository 650 mg  650 mg Rectal Q4H PRN [name], Willaim Rayas, MD      . albuterol (PROVENTIL) (2.5 MG/3ML) 0.083% nebulizer solution 2.5 mg  2.5 mg Nebulization Q4H PRN Anders Simmonds, MD   2.5 mg at 10/06/19 1022  . amLODipine (NORVASC) tablet 10 mg  10 mg Per Tube Daily Jacky Kindle, MD   10 mg at 10/14/19 1034  . atorvastatin (LIPITOR) tablet 40 mg  40 mg Per NG tube Daily Jacky Kindle, MD   40 mg at 10/14/19 1034  . budesonide (PULMICORT) nebulizer solution 0.5 mg  0.5 mg Nebulization BID Wendee Beavers T, MD   0.5 mg at 10/14/19 0810  . chlorhexidine gluconate (MEDLINE KIT) (PERIDEX) 0.12 % solution 15 mL  15 mL Mouth Rinse BID Jacky Kindle, MD   15 mL at 10/14/19 0831  . Chlorhexidine Gluconate Cloth 2 % PADS 6 each  6 each Topical Daily Luanne Bras, MD   6 each at 10/13/19 2300  . dextrose 5 %-0.45 % sodium chloride infusion   Intravenous Continuous Collier Bullock, MD 50 mL/hr at 10/14/19 1800 Rate Verify at 10/14/19 1800  . famotidine (PEPCID) tablet 20 mg  20 mg Per Tube BID Kipp Brood, MD   20 mg at 10/14/19 1034  . feeding supplement (JEVITY 1.2 CAL) liquid 1,000 mL  1,000 mL Per Tube Continuous Terrilee Croak, MD   Stopped at 10/12/19 1557  . feeding supplement (PROSource TF) liquid 45 mL  45 mL Per Tube TID Brand Males, MD   45 mL at 10/14/19 1649  . fentaNYL (SUBLIMAZE) injection 25-100 mcg  25-100 mcg Intravenous Q2H PRN Rigoberto Noel, MD   100 mcg at 10/12/19 1535  . ferrous sulfate 220 (44 Fe) MG/5ML solution 220 mg  220 mg Per Tube TID WC Jacky Kindle, MD   220 mg at 10/14/19 1400  . hydrALAZINE (APRESOLINE) injection 10-20 mg  10-20 mg Intravenous Q4H PRN  Frederik Pear, MD   20 mg at 10/11/19 0204  . hydrALAZINE (APRESOLINE) tablet 100 mg  100 mg Per Tube Q6H Vann, Jessica U, DO   100 mg at 10/14/19 1800  . hydrocerin (EUCERIN) cream   Topical TID Geradine Girt, DO   Given at 10/14/19 1651  . insulin aspart (novoLOG) injection 0-15 Units  0-15 Units Subcutaneous Q4H Shah, Pratik D, DO   2 Units at 10/14/19 1650  . insulin aspart (novoLOG) injection 4 Units  4 Units Subcutaneous Q4H Shah, Pratik D, DO   4 Units at 10/14/19 1651  . insulin glargine (LANTUS) injection 12 Units  12 Units Subcutaneous Daily Heath Lark D, DO   12 Units at 10/14/19 1038  . iohexol (OMNIPAQUE) 300 MG/ML solution 50  mL  50 mL Per Tube Once PRN Sandi Mariscal, MD      . ipratropium-albuterol (DUONEB) 0.5-2.5 (3) MG/3ML nebulizer solution 3 mL  3 mL Nebulization TID Terrilee Croak, MD   3 mL at 10/14/19 1507  . labetalol (NORMODYNE) injection 10 mg  10 mg Intravenous Q2H PRN Phillips Odor, MD   10 mg at 10/08/19 2122  . lip balm (CARMEX) ointment   Topical PRN Heath Lark D, DO   1 application at 47/65/46 [phone]  . MEDLINE mouth rinse  15 mL Mouth Rinse 10 times per day Jacky Kindle, MD   15 mL at 10/14/19 1800  . oxyCODONE (Oxy IR/ROXICODONE) immediate release tablet 5 mg  5 mg Per Tube Q6H PRN Brand Males, MD   5 mg at 10/11/19 1731  . QUEtiapine (SEROQUEL) tablet 100 mg  100 mg Per Tube QHS Brand Males, MD   100 mg at 10/13/19 2133  . sodium chloride flush (NS) 0.9 % injection 10-40 mL  10-40 mL Intracatheter Q12H Luanne Bras, MD   10 mL at 10/13/19 2134  . sodium chloride flush (NS) 0.9 % injection 10-40 mL  10-40 mL Intracatheter PRN Luanne Bras, MD   10 mL at 09/14/19 2242     Discharge Medications: Please see discharge summary for a list of discharge medications.  Relevant Imaging Results:  Relevant Lab Results:   Additional Information [id number]. Patient has LUE Splint due to weakness/paralysis. Has LLE Ortho  Boot;  Daimion Adamcik, Mila Homer, LCSW

## 2019-10-14 NOTE — Progress Notes (Signed)
Referring Physician(s): Melvenia Beam  Supervising Physician: Luanne Bras  Patient Status:  [facility] - In-pt  Chief Complaint: None-tracheostomy without sedation  Subjective:  Right ICA cervical/petrous junction stenosis s/p revascularization using balloon angioplasty via right radial and right femoral approach6/14/2021by Dr. Estanislado Pandy. Mild-moderate right thigh (with some retroperitoneal component) hematoma. Acute blood loss anemia, Jehovah's Witness. Patientawakelaying in bed, tracheostomy in place,no sedation.Shefollowssimple commandsand isalert today.Nods head appropriately to simple yes/no questions. Follows commands with right side and LUE, demonstrates neglectofLLE. Right groin and right radial puncture sites c/d/i. Most recenthgbwas8.5 yesterdat (grossly stable from most recent hgb 8.4 on 10/10/2019). No family at bedside this AM.   Allergies: Patient has no known allergies.  Medications: Prior to Admission medications   Medication Sig Start Date End Date Taking? Authorizing Provider  amLODipine (NORVASC) 10 MG tablet Take 1 tablet (10 mg total) by mouth daily. 05/12/19  Yes Minette Brine, FNP  aspirin EC 81 MG tablet Take 81 mg by mouth every evening.   Yes [provider]  Calcium Carbonate-Vitamin D (CALCIUM-D PO) Take 2 tablets by mouth daily at 12 noon.    Yes [provider]  carvedilol (COREG) 6.25 MG tablet TAKE 1 TABLET(6.25 MG) BY MOUTH TWICE DAILY Patient taking differently: Take 6.25 mg by mouth 2 (two) times daily with a meal.  07/31/19  Yes Minette Brine, FNP  hydrALAZINE (APRESOLINE) 50 MG tablet Take 1 tablet (50 mg total) by mouth 2 (two) times daily. 06/16/19 09/14/19 Yes Elouise Munroe, MD  Insulin Degludec-Liraglutide (XULTOPHY) 100-3.6 UNIT-MG/ML SOPN Inject 30 Units into the skin daily. 08/14/19  Yes Minette Brine, FNP  telmisartan-hydrochlorothiazide (MICARDIS HCT) 40-12.5 MG tablet Take 1 tablet by mouth daily.  07/31/19  Yes Minette Brine, FNP  ticagrelor (BRILINTA) 90 MG TABS tablet Take 1 tablet (90 mg total) by mouth 2 (two) times daily. 06/12/19  Yes Elouise Munroe, MD  vitamin C (ASCORBIC ACID) 250 MG tablet Take 500 mg by mouth daily at 12 noon.   Yes [provider]  blood glucose meter kit and supplies KIT Dispense based on patient and insurance preference. Use up to four times daily as directed. (FOR ICD-9 250.00, 250.01). 05/10/18   Minette Brine, FNP  Blood Glucose Monitoring Suppl (TRUE METRIX METER) w/Device KIT 1 each by Does not apply route in the morning, at noon, in the evening, and at bedtime. 05/22/19   Minette Brine, FNP  Evolocumab (REPATHA SURECLICK) 698 MG/ML SOAJ Inject 140 mg into the skin every 14 (fourteen) days. 06/30/19   Elouise Munroe, MD  glucose blood (TRUE METRIX BLOOD GLUCOSE TEST) test strip Check blood sugar 4 times a day before meals and bedtime 05/22/19   Minette Brine, FNP  Insulin Glargine-Lixisenatide (SOLIQUA) 100-33 UNT-MCG/ML SOPN Inject 25 Units into the skin daily. Patient taking differently: Inject 30 Units into the skin daily.  07/14/19   Minette Brine, FNP  Multiple Vitamin (MULTIVITAMIN PO) Take 1 tablet by mouth daily at 12 noon.     [provider]  Omega-3 Fatty Acids (OMEGA-3 PLUS PO) Take 2 capsules by mouth daily at 12 noon.    [provider]     Vital Signs: BP (!) 178/64 (BP Location: Left Arm)   Pulse (!) 105   Temp 97.7 F (36.5 C) (Oral)   Resp 20   Ht 5' 1" (1.549 m)   Wt 169 lb 12.1 oz (77 kg)   SpO2 100%   BMI 32.07 kg/m   Physical Exam  Vitals and nursing note reviewed.  Constitutional:      General: She is not in acute distress.    Comments: Tracheostomy without sedation.  Pulmonary:     Effort: Pulmonary effort is normal. No respiratory distress.     Comments: Tracheostomy without sedation. Abdominal:     Comments: Gastrostomy tube site without erythema, drainage, or active bleeding; bumper  cinched to skin.  Skin:    General: Skin is warm and dry.     Comments: Right groin puncture site soft without active bleeding or hematoma. Right radial puncture site soft without active bleeding or hematoma.    Neurological:     Mental Status: She is alert.     Comments: Tracheostomy without sedation. Alert today, nods to yes/no questions appropriately, follows simple commands of right side. PERRL bilaterally. Follows commands with right side (wiggles right toes, grips with right hand and lifts LUE over head) and LUE (bends at elbow), demonstrates neglect of LLE. Distal pulses (DPs) 1+ bilaterally and (radial) 2+ bilaterally.      Imaging: IR GASTROSTOMY TUBE MOD SED  Result Date: 10/10/2019 INDICATION: Dysphagia. Please perform percutaneous gastrostomy tube placement for enteric nutrition supplementation purposes. EXAM: PULL TROUGH GASTROSTOMY TUBE PLACEMENT COMPARISON:  CT abdomen pelvis-08/27/2018 MEDICATIONS: Ancef 2 gm IV; Antibiotics were administered within 1 hour of the procedure. Glucagon 1 mg IV CONTRAST:  10 mL of Omnipaque 300 administered into the gastric lumen. ANESTHESIA/SEDATION: Moderate (conscious) sedation was employed during this procedure. A total of Versed 2 mg and Fentanyl 100 mcg was administered intravenously. Moderate Sedation Time: 20 minutes. The patient's level of consciousness and vital signs were monitored continuously by radiology nursing throughout the procedure under my direct supervision. FLUOROSCOPY TIME:  6 minutes, 6 seconds (69 mGy) COMPLICATIONS: None immediate. PROCEDURE: Informed written consent was obtained from patient's family (sister) following explanation of the procedure, risks, benefits and alternatives. A time out was performed prior to the initiation of the procedure. Ultrasound scanning was performed to demarcate the edge of the left lobe of the liver. Maximal barrier sterile technique utilized including caps, mask, sterile gowns, sterile gloves,  large sterile drape, hand hygiene and Betadine prep. The left upper quadrant was sterilely prepped and draped. An oral gastric catheter was inserted into the stomach under fluoroscopy. The existing nasogastric feeding tube was removed. The left costal margin and air opacified transverse colon were identified and avoided. Air was injected into the stomach for insufflation and visualization under fluoroscopy. Under sterile conditions a 17 gauge trocar needle was utilized to access the stomach percutaneously beneath the left subcostal margin after the overlying soft tissues were anesthetized with 1% Lidocaine with epinephrine. Needle position was confirmed within the stomach with aspiration of air and injection of small amount of contrast. A single T tack was deployed for gastropexy. Over an Amplatz guide wire, a 9-French sheath was inserted into the stomach. A snare device was utilized to capture the oral gastric catheter. The snare device was pulled retrograde from the stomach up the esophagus and out the oropharynx. The 20-French pull-through gastrostomy was connected to the snare device and pulled antegrade through the oropharynx down the esophagus into the stomach and then through the percutaneous tract external to the patient. The gastrostomy was assembled externally. Contrast injection confirms position in the stomach. Several spot radiographic images were obtained in various obliquities for documentation. The patient tolerated procedure well without immediate post procedural complication. FINDINGS: After successful fluoroscopic guided placement, the gastrostomy tube is appropriately positioned with internal  disc against the ventral aspect of the gastric lumen. IMPRESSION: Successful fluoroscopic insertion of a 20-French pull-through gastrostomy tube. The gastrostomy may be used immediately for medication administration and in 24 hrs for the initiation of feeds. Electronically Signed   By: Sandi Mariscal M.D.   On:  10/10/2019 16:02    Labs:  CBC: Recent Labs    10/07/19 0617 10/09/19 0731 10/10/19 0507 10/13/19 0614  WBC 7.0 4.8 5.2 4.2  HGB 6.6* 7.6* 8.4* 8.5*  HCT 23.5* 27.4* 29.0* 29.5*  PLT 262 274 307 310    COAGS: Recent Labs    11/27/18 0728 08/18/19 0705 08/26/19 1715 08/27/19 0230  INR 0.9 0.9 1.1 1.2    BMP: Recent Labs    10/04/19 0500 10/06/19 1155 10/10/19 0507 10/13/19 0614  NA 140 143 141 138  K 4.0 3.8 3.3* 3.5  CL 100 103 104 103  CO2 32 29 29 32  GLUCOSE 260* 223* 145* 153*  BUN 20 16 <5* 8  CALCIUM 9.0 9.2 9.1 8.6*  CREATININE 0.69 0.71 0.55 0.48  GFRNONAA >60 >60 >60 >60  GFRAA >60 >60 >60 >60    LIVER FUNCTION TESTS: Recent Labs    09/17/19 0703 09/17/19 0703 09/20/19 0500 09/21/19 0500 09/26/19 0537 10/06/19 1155  BILITOT 1.1  --  0.7 0.3 0.5  --   AST 32  --  _0 --   ALT 23  --  29 36 42  --   ALKPHOS 82  --  95 81 57  --   PROT 6.6  --  7.4 7.1 5.5*  --   ALBUMIN 1.6*   < > 1.5* 1.6* 1.8* 2.5*   < > = values in this interval not displayed.    Assessment and Plan:  Right ICA cervical/petrous junction stenosis s/p revascularization using balloon angioplasty via right radial and right femoral approach6/14/2021by Dr. Estanislado Pandy. Mild-moderate right thigh (with some retroperitoneal component) hematoma. Acute blood loss anemia, Jehovah's Witness. Patient's conditionstable-tracheostomy withoutsedation, alert today, follows simple commands of right side and LUE, demonstrates neglectofLLE. Right groinand right radial puncture sitesstable, no signs of active bleeding. Hgbgrossly stable at 8.5 yesterday-receiving supplementation per severe anemia protocol. For possible transfer toSNF (insurance denied LTAC),awaitingfamily decision regarding SNF placement. Continue to hold Brilinta/Aspirin/Lovenox.Agree with CCM plans toavoid excess blood drawl. Further plans perTRH/neurology/palliative care- appreciate and agree with  management. NIR to follow.   Electronically Signed: Earley Abide, PA-C 10/14/2019, 10:46 AM   I spent a total of 15 Minutes at the the patient's bedside AND on the patient's hospital floor or unit, greater than 50% of which was counseling/coordinating care for right ICA stenosis s/p revascularization.

## 2019-10-14 NOTE — Progress Notes (Signed)
PROGRESS NOTE  Donna [name]  DOB: April 12, 1950  PCP: Minette Brine, [place] WUJ:[id number]  DOA: 08/25/2019  LOS: 50 days   No chief complaint on file.  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  6/14, patient was admitted by Neuro IR and underwent cerebral angiogram s/p RT ICA cervical/ petrous junction balloon angioplasty for severe stenosis via right femoral approach. Postprocedure, patient was placed on ASA/ brillinta and monitored in Neuro ICU.  6/15, noted hypotension, increased left sided weakness, lethargy and left-sided neglect. Hgb drop from 13.3 to 6.9 with new AKI. Post MRI/ MRA showed scattered small acute infarcts along the right MCA/ watershed territory suspected to be resultant either post procedure vs hypoperfusion. Continues to have left-sided neglect. 6/15, CT abd/ pelvis was obtained given concern for retroperitoneal bleed which showed a mild to moderate amount of retroperitoneal hematoma in the right iliac and inguinal regiones with moderate size hematoma noted the the soft tissues anterior to the musculature of the right hip; no definite intrapertioneal hemorrhage. INR, PT, and platelets rechecked and were wnl. Vascular surgery was consulted.  No intervention done by vascular given bleeding was mostly into the muscle and tissue.  Patient has had long hospitalization since then.  6/18 -intubated, reintubated 6/23 and 6/28 for failed trial of extubation. 7/3 -underwent tracheostomy 7/25-7/26, patient developed respiratory distress with significant desaturation. CXR did not show any acute finding. Tube feed was kept on held given concern for aspiration. Tracheal aspiration was performed.  Received high-dose Solu-Medrol and Lasix.  PCCM consulted.   CTA chest negative for PE but showed GGO and nodular opacities in bilateral upper lobes.  7/30, patient underwent successful PEG tube placement by IR  Subjective: Patient was seen and examined this morning.   Not in distress.  On 5 L oxygen by trach collar.   Ongoing PEG tube feeding.  No change in his status in last 24 hours.    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  -No anticoagulation or antiplatelet because of bleeding -Neurology signed off. -Continues to have left-sided weakness, gradually improving with physical therapy.  Acutemetabolicencephalopathy -Improved. Responds to questions by nodding and follows motor command. -cotninue seroquel at 122m QHS (has helped come off precedex) -monitor QTc intermittently.  Dysphagia:  -due to CVA. -7/30, patient underwent successful PEG tube placement by IR -Nutrition consult appreciated.  PEG tube feeding started.. -Continue aspiration precautions.  Acute on chronic respiratory failure with hypoxia:  Aspiration pneumonia. -Currently on trach collar at 5 L/min.  -7/25-7/26, patient developed respiratory distress with significant desaturation. CXR did not show any acute finding. Tube feed was kept on held given concern for aspiration. Tracheal aspiration was performed.  Received high-dose Solu-Medrol and Lasix. PCCM consulted. CTA chest negative for PE but showed GGO and nodular opacities in bilateral upper lobes.  -Tracheal aspirate from 7/28 is showing moderate growth of Morganella.  Likely colonization. -Respiratory status improved.  Continue Pulmicort and DuoNeb.  Not on antibiotics. -Appreciate PCCM input.  Volume Overload Essential hypertension -Appears euvolemic on exam. Intermittently received IV Lasix. -Goal to keep SBP<160 given intracranial  stenosis. -Currently on amlodipine 10 mg daily and hydralazine 100 mg every 6 hours.  Acute blood loss anemia  Anemia  of critical illness:  -Jehovah's Witness, no blood products including albumin. -Continue severe anemia protocol: Iron, Folic acid, vitamin J88, Monitor off Epogen and Vit C that was previously given -Intermittently monitor H&H.  Hemoglobin on last blood work on 7/30 was 8.4. -Avoid blood draws if possible  Diabetes With hyperglycemia Lab Results  Component Value Date   HGBA1C 5.2 10/03/2019   -Currently blood sugar trending less than 200 on Levemir 12 units and sliding scale insulin.  Continue the same with Accu-Cheks.  Debility/physical deconditioning -Continue PT/OT when medically stable.  Mobility: PT evaluation Code Status:   Code Status: Full Code  Nutritional status: Body mass index is 32.07 kg/m. Nutrition Problem: Inadequate oral intake Etiology: inability to eat Signs/Symptoms: NPO status Diet Order            Diet NPO time specified  Diet effective now                 DVT prophylaxis: Place and maintain sequential compression device Start: 09/26/19 1319   Antimicrobials:  Currently on none Fluid: None  Consultants: PCCM, neuro IR, IR, Family Communication:  None at bedside  Status is: Inpatient  Remains inpatient appropriate because: Pending SNF placement   Dispo: The patient is from: Home              Anticipated d/c is to: SNF              Anticipated d/c date is: Whenever bed/insurance authorization available              Patient currently is medically stable to d/c.   Infusions:  . sodium chloride Stopped (10/09/19 0900)  . dextrose 5 % and 0.45% NaCl 50 mL/hr at 10/14/19 1100  . feeding supplement (JEVITY 1.2 CAL) Stopped (10/12/19 1557)    Scheduled Meds: . amLODipine  10 mg Per Tube Daily  . atorvastatin  40 mg Per NG tube Daily  . budesonide (PULMICORT) nebulizer solution  0.5 mg Nebulization BID  . chlorhexidine  gluconate (MEDLINE KIT)  15 mL Mouth Rinse BID  . Chlorhexidine Gluconate Cloth  6 each Topical Daily  . famotidine  20 mg Per Tube BID  . feeding supplement (PROSource TF)  45 mL Per Tube TID  . ferrous sulfate  220 mg Per Tube TID WC  . hydrALAZINE  100 mg Per Tube Q6H  . hydrocerin   Topical TID  . insulin aspart  0-15 Units Subcutaneous Q4H  . insulin aspart  4 Units Subcutaneous Q4H  . insulin glargine  12 Units Subcutaneous Daily  . ipratropium-albuterol  3 mL Nebulization TID  . mouth rinse  15 mL Mouth Rinse 10 times per day  . QUEtiapine  100 mg Per Tube QHS  . sodium chloride flush  10-40 mL Intracatheter Q12H    Antimicrobials: Anti-infectives (From admission, onward)   Start     Dose/Rate Route Frequency Ordered Stop   10/10/19 1357  ceFAZolin (ANCEF) 2-4 GM/100ML-% IVPB       Note to Pharmacy: Leak, Brandi   : cabinet override      10/10/19 1357 10/10/19 1410   10/10/19 0830  ceFAZolin (ANCEF) 2-4 GM/100ML-% IVPB       Note to Pharmacy: Domenick Bookbinder   : cabinet override      10/10/19 0830 10/10/19 2044   09/16/19 2130  vancomycin (VANCOCIN) IVPB 1000 mg/200 mL premix  Status:  Discontinued        1,000 mg 200 mL/hr over 60 Minutes  Intravenous Every 12 hours 09/16/19 0839 09/18/19 0756   09/16/19 1630  piperacillin-tazobactam (ZOSYN) IVPB 3.375 g        3.375 g 12.5 mL/hr over 240 Minutes Intravenous Every 8 hours 09/16/19 0835 09/23/19 1153   09/16/19 1000  ceFEPIme (MAXIPIME) 1 g in sodium chloride 0.9 % 100 mL IVPB  Status:  Discontinued        1 g 200 mL/hr over 30 Minutes Intravenous Every 12 hours 09/16/19 0756 09/16/19 0803   09/16/19 0845  piperacillin-tazobactam (ZOSYN) IVPB 3.375 g        3.375 g 100 mL/hr over 30 Minutes Intravenous  Once 09/16/19 0834 09/16/19 1007   09/16/19 0845  vancomycin (VANCOREADY) IVPB 1500 mg/300 mL        1,500 mg 150 mL/hr over 120 Minutes Intravenous  Once 09/16/19 0839 09/16/19 1248   09/16/19 0830   piperacillin-tazobactam (ZOSYN) IVPB 3.375 g  Status:  Discontinued        3.375 g 12.5 mL/hr over 240 Minutes Intravenous Every 8 hours 09/16/19 0803 09/16/19 0834   09/16/19 0800  vancomycin (VANCOCIN) IVPB 1000 mg/200 mL premix  Status:  Discontinued        1,000 mg 200 mL/hr over 60 Minutes Intravenous Every 12 hours 09/16/19 0756 09/16/19 0839   08/31/19 1400  Ampicillin-Sulbactam (UNASYN) 3 g in sodium chloride 0.9 % 100 mL IVPB  Status:  Discontinued        3 g 200 mL/hr over 30 Minutes Intravenous Every 6 hours 08/31/19 1357 09/02/19 0819   08/25/19 0723  ceFAZolin (ANCEF) IVPB 2g/100 mL premix        2 g 200 mL/hr over 30 Minutes Intravenous 60 min pre-op 08/25/19 0723 08/25/19 0930      PRN meds: acetaminophen **OR** acetaminophen (TYLENOL) oral liquid 160 mg/5 mL **OR** acetaminophen, albuterol, fentaNYL (SUBLIMAZE) injection, hydrALAZINE, iohexol, labetalol, lip balm, oxyCODONE, sodium chloride flush   Objective: Vitals:   10/14/19 1128 10/14/19 1200  BP:  (!) 176/86  Pulse: 91 (!) 103  Resp: 20 14  Temp:  99.1 F (37.3 C)  SpO2: 100% 100%    Intake/Output Summary (Last 24 hours) at 10/14/2019 1454 Last data filed at 10/14/2019 1100 Gross per 24 hour  Intake 1416.55 ml  Output 500 ml  Net 916.55 ml   Filed Weights   10/12/19 0426 10/13/19 0339 10/14/19 0309  Weight: 73.5 kg 77.3 kg 77 kg   Weight change: -0.3 kg Body mass index is 32.07 kg/m.   Physical Exam: General exam: Appears calm and comfortable.  Not in physical distress.   Skin: No rashes, lesions or ulcers. HEENT: Has a trach collar anteriorly.  On 5 L oxygen by nasal cannula. Lungs: Clear to auscultate bilaterally.  No crackles or wheezing. CVS: Regular rate and rhythm, no murmur GI/Abd soft, nontender, nondistended, bowel sound present.  PEG tube site intact. CNS: Alert, awake, able to follow motor commands to move right side.  Psychiatry: Mood and affect normal Extremities: No pedal edema, no  calf tenderness. Legs on boots to prevent pressure ulcers.  Data Review: I have personally reviewed the laboratory data and studies available.  Recent Labs  Lab 10/09/19 0731 10/10/19 0507 10/13/19 0614  WBC 4.8 5.2 4.2  NEUTROABS  --  3.4 2.4  HGB 7.6* 8.4* 8.5*  HCT 27.4* 29.0* 29.5*  MCV 103.0* 99.7 99.7  PLT 274 307 310   Recent Labs  Lab 10/10/19 0507 10/13/19 0614  NA 141 138  K 3.3*  3.5  CL 104 103  CO2 29 32  GLUCOSE 145* 153*  BUN <5* 8  CREATININE 0.55 0.48  CALCIUM 9.1 8.6*  MG 1.8 1.9  PHOS 2.9 3.6   Lab Results  Component Value Date   HGBA1C 5.2 10/03/2019       Component Value Date/Time   CHOL 217 (H) 05/22/2019 1312   TRIG 114 05/22/2019 1312   HDL 65 05/22/2019 1312   CHOLHDL 3.3 05/22/2019 1312   LDLCALC 132 (H) 05/22/2019 1312   LABVLDL 20 05/22/2019 1312    Signed, Terrilee Croak, MD [facility] Pager: [phone] (Secure Chat preferred). 10/14/2019

## 2019-10-14 NOTE — TOC Progression Note (Signed)
Transition of Care [facility]) - Progression Note    Patient Details  Name: Donna [name] MRN: [id number] Date of Birth: June 06, 1950  [facility] CM/SW Contact  Okey Dupre Lazaro Arms, LCSW Phone Number: 10/14/2019, 6:24 PM  Clinical Narrative: CSW initiated skilled facility search based on son's conversation with CSW on 7/27. CSW will continue to follow, provide son with facility responses and any other SW interventions services as needed.      Expected Discharge Plan: Skilled Nursing Facility Barriers to Discharge: Continued Medical Work up  Expected Discharge Plan and Services Expected Discharge Plan: Skilled Nursing Facility                                             Social Determinants of Health (SDOH) Interventions  No SDOH intervention services requested or needed at this time   Readmission Risk Interventions No flowsheet data found.

## 2019-10-14 NOTE — Progress Notes (Signed)
  Speech Language Pathology Treatment: Dysphagia;Passy Muir Speaking valve  Patient Details Name: Donna [name] MRN: [id number] DOB: 1951/02/17 Today's Date: 10/14/2019 Time: 4098-1191 SLP Time Calculation (min) (ACUTE ONLY): 18 min  Assessment / Plan / Recommendation Clinical Impression  Pt is progressing nicely towards goals with PMV and dysphagia. Mild consistent stridor with use of speaking valve that has been present from initial assessment. Her vitals are stable, no signs of distress or evidence of air trapping. Her respirations support vocalizations and needs additional time to response from cognitive standpoint.  Oral care was followed by pudding and nectar thick liquids with set up provided and pt able to self feed with spoon and cup. Swallow was loud which could represent discoordination and exhibited subtle throat clear. Therapist recommends repeating MBS to determine ability to re-initiate food and liquids.Prior MBS recommended diet which was ordered then she experienced respiratory distress and was made NPO 7/25. It is appropriate to re-evaluate with MBS for possible return to po's. Will check on pt 8/4 and if appropriate proceed with MBS.   HPI HPI: 69 y.o. female with PMH significant for HTN, HLD, CVA 06/2018, DM, anemia, obesity, OSA, and OA. Pt underwent R ICA angioplasty on 6/14. Pt with decline in neuro status and hemoglobin on 6/15, found to have a large R groin hematoma.  Pt has had Hbg 5.0 or less and is a Jehovah's witness. MRI showed scattered small acute infarcts along the right MCA/watershed territory. Solitary small acute infarct in the left parietal cortex and right cerebellum. She was intubated on 6/19. Pt underwent tracheostomy on 7/3. PT found to be septic with RLL PNA on 7/6.  MBS 7/22 - started on dys1/nectars.  Patient now with a Shiley #6 cuffless trach in place.  She has planned PEG placement 7/30.        SLP Plan  Continue with current plan of care        Recommendations  Diet recommendations: NPO (see impressions ) Medication Administration: Via alternative means      Patient may use Passy-Muir Speech Valve: During all therapies with supervision PMSV Supervision: Full         Oral Care Recommendations: Oral care QID Follow up Recommendations: Skilled Nursing facility;24 hour supervision/assistance SLP Visit Diagnosis: Aphonia (R49.1);Dysphagia, unspecified (R13.10) Plan: Continue with current plan of care                       Royce Macadamia 10/14/2019, 10:43 AM  Breck Coons [place].Ed Nurse, children's [phone] Office [phone]

## 2019-10-15 ENCOUNTER — Inpatient Hospital Stay (HOSPITAL_COMMUNITY): Payer: Medicare HMO

## 2019-10-15 DIAGNOSIS — I6521 Occlusion and stenosis of right carotid artery: Secondary | ICD-10-CM | POA: Diagnosis not present

## 2019-10-15 LAB — GLUCOSE, CAPILLARY
Glucose-Capillary: 156 mg/dL — ABNORMAL HIGH (ref 70–99)
Glucose-Capillary: 184 mg/dL — ABNORMAL HIGH (ref 70–99)
Glucose-Capillary: 188 mg/dL — ABNORMAL HIGH (ref 70–99)
Glucose-Capillary: 190 mg/dL — ABNORMAL HIGH (ref 70–99)
Glucose-Capillary: 191 mg/dL — ABNORMAL HIGH (ref 70–99)
Glucose-Capillary: 257 mg/dL — ABNORMAL HIGH (ref 70–99)

## 2019-10-15 MED ORDER — RESOURCE THICKENUP CLEAR PO POWD
ORAL | Status: DC | PRN
  Filled 2019-10-15: qty 125

## 2019-10-15 NOTE — Progress Notes (Addendum)
Modified Barium Swallow Progress Note  Patient Details  Name: Donna [name] MRN: [id number] Date of Birth: Oct 11, 1950  Today's Date: 10/15/2019  Modified Barium Swallow completed.  Full report located under Chart Review in the Imaging Section.  Brief recommendations include the following:  Clinical Impression  Pt demonstrated min-mild oropharyngeal dysphagia and improved from prior MBS with PMV donned. Orally she exhibited dfficulty maintaining bolus resulting in sublinual aggregation and spill to vallculae. Puree bolus reached his pyriform sinuses and remained for 5 seconds before swallow onset. Trace and mostly flash penetration with thin and nectar via straw. Smaller sips were effective however given oral impairments, cognitive deficits and respiratory compromise, recommend initiating nectar thick liquids. Mildly prolonged mastication with cracker. Recommend Dys 2, nectar thick liquids, pills whole in puree, full supervision/assist and place PMV.   May need tube feedings modified to promote hunger per dietitian.        Swallow Evaluation Recommendations       SLP Diet Recommendations: Nectar thick liquid;Dysphagia 2 (Fine chop) solids   Liquid Administration via: Cup;Straw   Medication Administration: Crushed with puree   Supervision: Staff to assist with self feeding;Full assist for feeding   Compensations: Slow rate;Small sips/bites;Other (Comment);Clear throat intermittently (PMV with meals)   Postural Changes: Remain semi-upright after after feeds/meals (Comment)   Oral Care Recommendations: Oral care BID        Royce Macadamia 10/15/2019,1:25 PM   Breck Coons [place].Ed Nurse, children's [phone] Office [phone]

## 2019-10-15 NOTE — Progress Notes (Signed)
Physical Therapy Treatment Patient Details Name: Donna [name] MRN: [id number] DOB: [birth year] Today's Date: 10/15/2019    History of Present Illness 69 y.o. female with PMH significant for HTN, HLD, CVA 06/2018, DM, anemia, obesity, OSA, and OA. She is known to [facility] and has been followed by Dr. Corliss Skains since 10/2018. Pt underwent R ICA angioplasty on 6/14. Pt with decline in neuro status and hemoglobin on 6/15, found to have a large R groin hematoma.  Pt has had Hbg 5.0 or less and is a Jehovah's witness.  She was intubated on 6/19. Pt underwent tracheostomy on 7/3. PT found to be septic with RLL PNA on 7/6. 7/13 trach tolerating extubation. Patient had a significant event on 7/25 resulting in respiratory distress, oxygen desaturation, and retroperitoneal hematomas in the right iliac/inguinal regions.    PT Comments    Patient was received laying in hospital bed. When removed from the PMV, she was able to say one-word phrases inconsistently and answer questions. Patient was rolled to her R and transferred to sit at EOB with MaxA. She was able to sit EOB for approximately 15 mins, when asked if she felt that she was leaning towards her left side she answered "yes" and was able to correct her posture and complete reaches side-to-side with Max cueing and facilitation. Showed improvement of core and L side activation. Attempt to stand in the Steady x 3 with TotalAx2. She was not able to fully extend her hips to get into standing position, however tolerated the treatment and is overall progressing well. Transferred sit <> supine and supine to R side roll at end of session for pressure relief and LUE WB to help activate L side. See below for assistance levels. C/o L foot pain, will continue to monitor this. Left in L sidelying with call bell within reach and friend present.    Follow Up Recommendations  SNF     Equipment Recommendations  Wheelchair (measurements PT);Wheelchair cushion (measurements  PT);Hospital bed    Recommendations for Other Services       Precautions / Restrictions Precautions Precautions: Fall Precaution Comments: trach, low hbg, PEG Required Braces or Orthoses: Other Brace Other Brace: L resting hand splint, prevalon boots Restrictions Weight Bearing Restrictions: No    Mobility  Bed Mobility Overal bed mobility: Needs Assistance Bed Mobility: Rolling;Supine to Sit;Sit to Supine Rolling: +2 for physical assistance;Max assist   Supine to sit: Max assist;+2 for physical assistance;HOB elevated Sit to supine: Total assist;+2 for physical assistance   General bed mobility comments: MaxA x2 for rolling and sitting up. Able to initiate movement and follow commands, still requires verbal and tactile cueing.  Transfers Overall transfer level: Needs assistance Equipment used:  (Steady) Transfers: Sit to/from Stand Sit to Stand: Total assist;+2 physical assistance            Ambulation/Gait             General Gait Details: could not assess gait   Stairs             Wheelchair Mobility    Modified Rankin (Stroke Patients Only)       Balance Overall balance assessment: Needs assistance Sitting-balance support: Single extremity supported;Feet supported Sitting balance-Leahy Scale: Poor Sitting balance - Comments: Sat EOB 15 mins. Knows that she leans to the left and was able to correct and shift to midline. Postural control: Posterior lean;Left lateral lean   Standing balance-Leahy Scale: Zero Standing balance comment: Needs total assistance to stand  Cognition Arousal/Alertness: Awake/alert Behavior During Therapy: WFL for tasks assessed/performed Overall Cognitive Status: Difficult to assess Area of Impairment: Attention;Problem solving;Safety/judgement;Awareness                   Current Attention Level: Sustained   Following Commands: Follows one step commands with  increased time;Follows multi-step commands inconsistently Safety/Judgement: Decreased awareness of safety;Decreased awareness of deficits Awareness: Intellectual Problem Solving: Slow processing;Requires verbal cues;Decreased initiation;Difficulty sequencing;Requires tactile cues General Comments: Follows one-step cues more consistently, can verbally answer questions.      Exercises      General Comments General comments (skin integrity, edema, etc.): Able to talk, attempt standing, and correct her posture while sitting at EOB today! L side and core activation improving.      Pertinent Vitals/Pain Pain Assessment: Faces Faces Pain Scale: Hurts a little bit Pain Location: pain in foot Pain Descriptors / Indicators: Grimacing Pain Intervention(s): Monitored during session    Home Living Family/patient expects to be discharged to:: Skilled nursing facility Living Arrangements: Children;Other relatives Available Help at Discharge: Family                Prior Function            PT Goals (current goals can now be found in the care plan section) Acute Rehab PT Goals Patient Stated Goal: none stated PT Goal Formulation: Patient unable to participate in goal setting Time For Goal Achievement: 10/23/19 Potential to Achieve Goals: Fair Progress towards PT goals: Progressing toward goals    Frequency    Min 3X/week      PT Plan Current plan remains appropriate    Co-evaluation PT/OT/SLP Co-Evaluation/Treatment: Yes Reason for Co-Treatment: Complexity of the patient's impairments (multi-system involvement);For patient/therapist safety;Necessary to address cognition/behavior during functional activity;To address functional/ADL transfers PT goals addressed during session: Mobility/safety with mobility;Balance;Strengthening/ROM        AM-PAC PT "6 Clicks" Mobility   Outcome Measure  Help needed turning from your back to your side while in a flat bed without using  bedrails?: Total Help needed moving from lying on your back to sitting on the side of a flat bed without using bedrails?: Total Help needed moving to and from a bed to a chair (including a wheelchair)?: Total Help needed standing up from a chair using your arms (e.g., wheelchair or bedside chair)?: Total Help needed to walk in hospital room?: Total Help needed climbing 3-5 steps with a railing? : Total 6 Click Score: 6    End of Session   Activity Tolerance: Patient tolerated treatment well Patient left: in bed;with family/visitor present;with call bell/phone within reach;with bed alarm set Nurse Communication: Mobility status;Need for lift equipment PT Visit Diagnosis: Other abnormalities of gait and mobility (R26.89);Muscle weakness (generalized) (M62.81);Other symptoms and signs involving the nervous system (R29.898);Hemiplegia and hemiparesis Hemiplegia - Right/Left: Left Hemiplegia - dominant/non-dominant: Non-dominant Hemiplegia - caused by: Cerebral infarction     Time:  -     Charges:               Elisha Ponder, SPT, ATC

## 2019-10-15 NOTE — Progress Notes (Signed)
PROGRESS NOTE  Donna [name]  DOB: 26-Jul-1950  PCP: Minette Brine, [place] JHE:[id number]  DOA: 08/25/2019  LOS: 51 days   No chief complaint on file.  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  6/14, patient was admitted by Neuro IR and underwent cerebral angiogram s/p RT ICA cervical/ petrous junction balloon angioplasty for severe stenosis via right femoral approach. Postprocedure, patient was placed on ASA/ brillinta and monitored in Neuro ICU.  6/15, noted hypotension, increased left sided weakness, lethargy and left-sided neglect. Hgb drop from 13.3 to 6.9 with new AKI. Post MRI/ MRA showed scattered small acute infarcts along the right MCA/ watershed territory suspected to be resultant either post procedure vs hypoperfusion. Continues to have left-sided neglect. 6/15, CT abd/ pelvis was obtained given concern for retroperitoneal bleed which showed a mild to moderate amount of retroperitoneal hematoma in the right iliac and inguinal regiones with moderate size hematoma noted the the soft tissues anterior to the musculature of the right hip; no definite intrapertioneal hemorrhage. INR, PT, and platelets rechecked and were wnl. Vascular surgery was consulted.  No intervention done by vascular given bleeding was mostly into the muscle and tissue.  Patient has had long hospitalization since then.  6/18 -intubated, reintubated 6/23 and 6/28 for failed trial of extubation. 7/3 -underwent tracheostomy 7/25-7/26, patient developed respiratory distress with significant desaturation. CXR did not show any acute finding. Tube feed was kept on held given concern for aspiration. Tracheal aspiration was performed.  Received high-dose Solu-Medrol and Lasix.  PCCM consulted.   CTA chest negative for PE but showed GGO and nodular opacities in bilateral upper lobes.  7/30, patient underwent successful PEG tube placement by IR  Subjective: Patient was seen and examined this morning.   Not in distress.  On 5 L oxygen by trach collar.   Ongoing PEG tube feeding.  No change in his status in last 24 hours.    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  -No anticoagulation or antiplatelet because of bleeding -Neurology signed off. -Continues to have left-sided weakness, gradually improving with physical therapy.  Acute metabolic encephalopathy There seems to be some gradual improvement responds to questions by nodding and follows motor command. --Continue seroquel at 122m QHS (has helped come off precedex) -monitor QTc intermittently.  Dysphagia:  -due to CVA. -7/30, patient underwent successful PEG tube placement by IR -Nutrition consult appreciated.  PEG tube feeding started.. -Continue aspiration precautions.  Acute on chronic respiratory failure with hypoxia:  Aspiration pneumonia. -Currently on trach collar at 5 L/min.  -7/25-7/26, patient developed respiratory distress with significant desaturation. CXR did not show any acute finding. Tube feed was kept on held given concern for aspiration. Tracheal aspiration was performed.  Received high-dose Solu-Medrol and Lasix. PCCM consulted. CTA chest negative for PE but showed GGO and nodular opacities in bilateral upper lobes.  -Tracheal aspirate from 7/28 is showing moderate growth of Morganella.  Likely colonization. -Respiratory status improved.  Continue Pulmicort and DuoNeb.  Not on antibiotics. -Appreciate PCCM input. -Now stable  Volume Overload Essential hypertension -Appears euvolemic on exam. Intermittently received IV  Lasix. -Goal to keep SBP<160 given intracranial stenosis. --Continue on amlodipine 10 mg daily and hydralazine 100 mg  every 6 hours.  Acute blood loss anemia Anemia of critical illness:  Hemoglobin stable -Jehovah's Witness, no blood products including albumin. -Continue severe anemia protocol: Iron, Folic acid, vitamin P80, Monitor off Epogen and Vit C that was previously given -Intermittently monitor H&H.  Hemoglobin on last blood work on 7/30 was 8.4. -Avoid blood draws if possible  Diabetes With hyperglycemia Lab Results  Component Value Date   HGBA1C 5.2 10/03/2019  Sugars controlled -Continue insulin  Debility/physical deconditioning -Continue PT/OT when medically stable.  Mobility: PT evaluation Code Status:   Code Status: Full Code  Nutritional status: Body mass index is 32.62 kg/m. Nutrition Problem: Inadequate oral intake Etiology: inability to eat Signs/Symptoms: NPO status Diet Order            DIET DYS 2 Room service appropriate? No; Fluid consistency: Nectar Thick  Diet effective now                 DVT prophylaxis: Place and maintain sequential compression device Start: 09/26/19 1319   Antimicrobials:  Currently on none Fluid: None  Consultants: PCCM, neuro IR, IR, Family Communication:  None at bedside  Status is: Inpatient  Remains inpatient appropriate because: Pending SNF placement   Dispo: The patient is from: Home              Anticipated d/c is to: SNF              Anticipated d/c date is: Whenever bed/insurance authorization available              Patient currently is medically stable to d/c.   Infusions:  . sodium chloride Stopped (10/09/19 0900)  . dextrose 5 % and 0.45% NaCl 50 mL/hr at 10/14/19 1800  . feeding supplement (JEVITY 1.2 CAL) Stopped (10/12/19 1557)    Scheduled Meds: . amLODipine  10 mg Per Tube Daily  . atorvastatin  40 mg Per NG tube Daily  . budesonide (PULMICORT) nebulizer solution  0.5 mg  Nebulization BID  . chlorhexidine gluconate (MEDLINE KIT)  15 mL Mouth Rinse BID  . Chlorhexidine Gluconate Cloth  6 each Topical Daily  . famotidine  20 mg Per Tube BID  . feeding supplement (PROSource TF)  45 mL Per Tube TID  . ferrous sulfate  220 mg Per Tube TID WC  . hydrALAZINE  100 mg Per Tube Q6H  . hydrocerin   Topical TID  . insulin aspart  0-15 Units Subcutaneous Q4H  . insulin aspart  4 Units Subcutaneous Q4H  . insulin glargine  12 Units Subcutaneous Daily  . ipratropium-albuterol  3 mL Nebulization TID  . mouth rinse  15 mL Mouth Rinse 10 times per day  . QUEtiapine  100 mg Per Tube QHS  . sodium chloride flush  10-40 mL Intracatheter Q12H    Antimicrobials: Anti-infectives (From admission, onward)   Start     Dose/Rate Route Frequency Ordered Stop   10/10/19 1357  ceFAZolin (ANCEF) 2-4 GM/100ML-% IVPB       Note to Pharmacy: Leak, Brandi   : cabinet override      10/10/19 1357 10/10/19 1410   10/10/19 0830  ceFAZolin (ANCEF) 2-4 GM/100ML-% IVPB       Note to Pharmacy: Domenick Bookbinder   : cabinet override      10/10/19 0830 10/10/19 2044   09/16/19 2130  vancomycin (VANCOCIN) IVPB 1000 mg/200 mL premix  Status:  Discontinued        1,000 mg 200 mL/hr over 60 Minutes  Intravenous Every 12 hours 09/16/19 0839 09/18/19 0756   09/16/19 1630  piperacillin-tazobactam (ZOSYN) IVPB 3.375 g        3.375 g 12.5 mL/hr over 240 Minutes Intravenous Every 8 hours 09/16/19 0835 09/23/19 1153   09/16/19 1000  ceFEPIme (MAXIPIME) 1 g in sodium chloride 0.9 % 100 mL IVPB  Status:  Discontinued        1 g 200 mL/hr over 30 Minutes Intravenous Every 12 hours 09/16/19 0756 09/16/19 0803   09/16/19 0845  piperacillin-tazobactam (ZOSYN) IVPB 3.375 g        3.375 g 100 mL/hr over 30 Minutes Intravenous  Once 09/16/19 0834 09/16/19 1007   09/16/19 0845  vancomycin (VANCOREADY) IVPB 1500 mg/300 mL        1,500 mg 150 mL/hr over 120 Minutes Intravenous  Once 09/16/19 0839 09/16/19 1248    09/16/19 0830  piperacillin-tazobactam (ZOSYN) IVPB 3.375 g  Status:  Discontinued        3.375 g 12.5 mL/hr over 240 Minutes Intravenous Every 8 hours 09/16/19 0803 09/16/19 0834   09/16/19 0800  vancomycin (VANCOCIN) IVPB 1000 mg/200 mL premix  Status:  Discontinued        1,000 mg 200 mL/hr over 60 Minutes Intravenous Every 12 hours 09/16/19 0756 09/16/19 0839   08/31/19 1400  Ampicillin-Sulbactam (UNASYN) 3 g in sodium chloride 0.9 % 100 mL IVPB  Status:  Discontinued        3 g 200 mL/hr over 30 Minutes Intravenous Every 6 hours 08/31/19 1357 09/02/19 0819   08/25/19 0723  ceFAZolin (ANCEF) IVPB 2g/100 mL premix        2 g 200 mL/hr over 30 Minutes Intravenous 60 min pre-op 08/25/19 0723 08/25/19 0930      PRN meds: acetaminophen **OR** acetaminophen (TYLENOL) oral liquid 160 mg/5 mL **OR** acetaminophen, albuterol, fentaNYL (SUBLIMAZE) injection, hydrALAZINE, iohexol, labetalol, lip balm, oxyCODONE, Resource ThickenUp Clear, sodium chloride flush   Objective: Vitals:   10/15/19 2004 10/15/19 2021  BP: (!) 161/67   Pulse: 100 (!) 106  Resp: 20 (!) 29  Temp: 99 F (37.2 C)   SpO2: 100% 97%    Intake/Output Summary (Last 24 hours) at 10/15/2019 2037 Last data filed at 10/15/2019 0800 Gross per 24 hour  Intake --  Output 2100 ml  Net -2100 ml   Filed Weights   10/13/19 0339 10/14/19 0309 10/15/19 0316  Weight: 77.3 kg 77 kg 78.3 kg   Weight change: 1.3 kg Body mass index is 32.62 kg/m.   Physical Exam: General exam: Appears calm and comfortable.  Not in physical distress.   Skin: No rashes, lesions or ulcers. HEENT: Has a trach collar anteriorly.  On 5 L oxygen by nasal cannula. Lungs: Clear to auscultate bilaterally.  No crackles or wheezing. CVS: Regular rate and rhythm, no murmur GI/Abd soft, nontender, nondistended, bowel sound present.  PEG tube site intact. CNS: Alert, awake, able to follow motor commands to move right side.  Psychiatry: Mood and affect  normal Extremities: No pedal edema, no calf tenderness. Legs on boots to prevent pressure ulcers.  Data Review: I have personally reviewed the laboratory data and studies available.  Recent Labs  Lab 10/09/19 0731 10/10/19 0507 10/13/19 0614  WBC 4.8 5.2 4.2  NEUTROABS  --  3.4 2.4  HGB 7.6* 8.4* 8.5*  HCT 27.4* 29.0* 29.5*  MCV 103.0* 99.7 99.7  PLT 274 307 310   Recent Labs  Lab 10/10/19 0507 10/13/19 0614  NA 141 138  K 3.3* 3.5  CL 104 103  CO2 29 32  GLUCOSE 145* 153*  BUN <5* 8  CREATININE 0.55 0.48  CALCIUM 9.1 8.6*  MG 1.8 1.9  PHOS 2.9 3.6   Lab Results  Component Value Date   HGBA1C 5.2 10/03/2019       Component Value Date/Time   CHOL 217 (H) 05/22/2019 1312   TRIG 114 05/22/2019 1312   HDL 65 05/22/2019 1312   CHOLHDL 3.3 05/22/2019 1312   LDLCALC 132 (H) 05/22/2019 1312   LABVLDL 20 05/22/2019 1312    Signed, Edwin Dada, MD [facility]   10/15/2019

## 2019-10-15 NOTE — Clinical Social Work Note (Signed)
Attempted to reach son, Tequia Wolman [phone], (5:59 pm) and could not leave a voice-mail  Sent text (HIPPA compliant) to son regarding the facility responses and informed him that a Child psychotherapist will follow-up with him.   PASRR number received: [id number]  Genelle Bal, MSW, LCSW Licensed Clinical Social Worker Clinical Social Work Department [employer] [phone].

## 2019-10-15 NOTE — Progress Notes (Signed)
Occupational Therapy Treatment Patient Details Name: Donna [name] MRN: [id number] DOB: [birth year] Today's Date: 10/15/2019    History of present illness 69 y.o. female with PMH significant for HTN, HLD, CVA 06/2018, DM, anemia, obesity, OSA, and OA. She is known to [facility] and has been followed by Dr. Corliss Skains since 10/2018. Pt underwent R ICA angioplasty on 6/14. Pt with decline in neuro status and hemoglobin on 6/15, found to have a large R groin hematoma.  Pt has had Hbg 5.0 or less and is a Jehovah's witness.  She was intubated on 6/19. Pt underwent tracheostomy on 7/3. PT found to be septic with RLL PNA on 7/6. 7/13 trach tolerating extubation. Patient had a significant event on 7/25 resulting in respiratory distress, oxygen desaturation, and retroperitoneal hematomas in the right iliac/inguinal regions.   OT comments  Focus of session on bed mobility, trunk strengthening, sitting balance and standing attempts. Pt responded favorably to stedy placed in front of her during sitting at EOB to encourage anterior weight shift. Following simple commands. Needs cues to use her voice (PSMV donned during session). L UE remains flaccid with L inattention. Positioned on L side for weight bearing through L shoulder at end of session. SNF continues to be the optimal discharge environment.  Follow Up Recommendations  SNF    Equipment Recommendations  Wheelchair cushion (measurements OT);Wheelchair (measurements OT);Hospital bed;Other (comment)    Recommendations for Other Services      Precautions / Restrictions Precautions Precautions: Fall Precaution Comments: trach, PEG, dense L hemiplegia Required Braces or Orthoses: Other Brace Other Brace: L resting hand splint, prevalon boots Restrictions Weight Bearing Restrictions: No       Mobility Bed Mobility Overal bed mobility: Needs Assistance Bed Mobility: Rolling;Supine to Sit;Sit to Supine Rolling: +2 for physical assistance;Max assist    Supine to sit: +2 for physical assistance;Mod assist Sit to supine: Total assist;+2 for physical assistance   General bed mobility comments: MaxA x2 for rolling and sitting up. Able to initiate movement and follow commands, still requires verbal and tactile cueing.  Transfers Overall transfer level: Needs assistance Equipment used:  (Steady) Transfers: Sit to/from Stand Sit to Stand: +2 physical assistance;Total         General transfer comment: can clear hips with +2 max assist    Balance Overall balance assessment: Needs assistance Sitting-balance support: Single extremity supported;Feet supported Sitting balance-Leahy Scale: Poor Sitting balance - Comments: Sat EOB 15 mins. Knows that she leans to the left and was able to correct and shift to midline. Postural control: Posterior lean;Left lateral lean   Standing balance-Leahy Scale: Zero Standing balance comment: attempted to stand x 3 with gait belt and bed pad under hips with bed height elevated                           ADL either performed or assessed with clinical judgement   ADL                                         General ADL Comments: pt readily willing to work with therapies, fatigued at end of session     Vision   Additional Comments: L field cut/ inattention   Perception     Praxis      Cognition Arousal/Alertness: Awake/alert Behavior During Therapy: WFL for tasks assessed/performed Overall Cognitive Status: Impaired/Different from  baseline Area of Impairment: Attention;Problem solving;Safety/judgement;Awareness                   Current Attention Level: Focused   Following Commands: Follows one step commands with increased time Safety/Judgement: Decreased awareness of safety;Decreased awareness of deficits Awareness: Intellectual Problem Solving: Slow processing;Requires verbal cues;Decreased initiation;Difficulty sequencing;Requires tactile cues General  Comments: Follows one-step cues more consistently, can verbally answer questions.        Exercises     Shoulder Instructions       General Comments Able to talk, attempt standing, and correct her posture while sitting at EOB today! L side and core activation improving.    Pertinent Vitals/ Pain       Pain Assessment: Faces Faces Pain Scale: Hurts a little bit Pain Location: L foot Pain Descriptors / Indicators: Grimacing Pain Intervention(s): Repositioned;Monitored during session  Home Living Family/patient expects to be discharged to:: Skilled nursing facility Living Arrangements: Children;Other relatives Available Help at Discharge: Family                                    Prior Functioning/Environment              Frequency  Min 2X/week        Progress Toward Goals  OT Goals(current goals can now be found in the care plan section)  Progress towards OT goals: Progressing toward goals  Acute Rehab OT Goals Patient Stated Goal: none stated OT Goal Formulation: Patient unable to participate in goal setting Time For Goal Achievement: 10/29/19 Potential to Achieve Goals: Fair ADL Goals Pt Will Perform Grooming: with min assist;sitting Pt Will Perform Upper Body Bathing: with mod assist;sitting  Plan Discharge plan remains appropriate    Co-evaluation    PT/OT/SLP Co-Evaluation/Treatment: Yes Reason for Co-Treatment: Complexity of the patient's impairments (multi-system involvement);For patient/therapist safety PT goals addressed during session: Mobility/safety with mobility;Balance;Strengthening/ROM OT goals addressed during session: Strengthening/ROM      AM-PAC OT "6 Clicks" Daily Activity     Outcome Measure   Help from another person eating meals?: Total Help from another person taking care of personal grooming?: A Lot Help from another person toileting, which includes using toliet, bedpan, or urinal?: Total Help from another  person bathing (including washing, rinsing, drying)?: Total Help from another person to put on and taking off regular upper body clothing?: Total Help from another person to put on and taking off regular lower body clothing?: Total 6 Click Score: 7    End of Session Equipment Utilized During Treatment: Gait belt  OT Visit Diagnosis: Muscle weakness (generalized) (M62.81);Cognitive communication deficit (R41.841);Hemiplegia and hemiparesis Symptoms and signs involving cognitive functions: Cerebral infarction Hemiplegia - Right/Left: Left Hemiplegia - dominant/non-dominant: Non-Dominant Hemiplegia - caused by: Cerebral infarction   Activity Tolerance Patient tolerated treatment well   Patient Left in bed;with call bell/phone within reach;with bed alarm set;with family/visitor present   Nurse Communication          Time: 3295-1884 OT Time Calculation (min): 33 min  Charges: OT General Charges $OT Visit: 1 Visit OT Treatments $Neuromuscular Re-education: 8-22 mins  Martie Round, OTR/L Acute Rehabilitation Services Pager: [phone] Office: [phone]   Evern Bio 10/15/2019, 3:31 PM

## 2019-10-15 NOTE — Progress Notes (Signed)
Referring Physician(s): Melvenia Beam  Supervising Physician: Luanne Bras  Patient Status:  Donna [name] - In-pt  Chief Complaint: None-tracheostomy without sedation  Subjective:  Right ICA cervical/petrous junction stenosis s/p revascularization using balloon angioplasty via right radial and right femoral approach6/14/2021by Dr. Estanislado Pandy. Mild-moderate right thigh (with some retroperitoneal component) hematoma. Acute blood loss anemia, Jehovah's Witness. Patientawakelaying in bed, tracheostomy in place,no sedation.Shefollowssimple commandsand ismore alert today (mouthing words). Can move all extremities today. Right groin and right radial puncture sites c/d/i. Most recenthgbwas8.5 10/13/2019 (grossly stable from last hgb 8.4 on 10/10/2019). No family at bedside this AM.   Allergies: Patient has no known allergies.  Medications: Prior to Admission medications   Medication Sig Start Date End Date Taking? Authorizing Provider  amLODipine (NORVASC) 10 MG tablet Take 1 tablet (10 mg total) by mouth daily. 05/12/19  Yes Minette Brine, FNP  aspirin EC 81 MG tablet Take 81 mg by mouth every evening.   Yes [provider]  Calcium Carbonate-Vitamin D (CALCIUM-D PO) Take 2 tablets by mouth daily at 12 noon.    Yes [provider]  carvedilol (COREG) 6.25 MG tablet TAKE 1 TABLET(6.25 MG) BY MOUTH TWICE DAILY Patient taking differently: Take 6.25 mg by mouth 2 (two) times daily with a meal.  07/31/19  Yes Minette Brine, FNP  hydrALAZINE (APRESOLINE) 50 MG tablet Take 1 tablet (50 mg total) by mouth 2 (two) times daily. 06/16/19 09/14/19 Yes Elouise Munroe, MD  Insulin Degludec-Liraglutide (XULTOPHY) 100-3.6 UNIT-MG/ML SOPN Inject 30 Units into the skin daily. 08/14/19  Yes Minette Brine, FNP  telmisartan-hydrochlorothiazide (MICARDIS HCT) 40-12.5 MG tablet Take 1 tablet by mouth daily. 07/31/19  Yes Minette Brine, FNP  ticagrelor (BRILINTA) 90 MG TABS tablet Take  1 tablet (90 mg total) by mouth 2 (two) times daily. 06/12/19  Yes Elouise Munroe, MD  vitamin C (ASCORBIC ACID) 250 MG tablet Take 500 mg by mouth daily at 12 noon.   Yes [provider]  blood glucose meter kit and supplies KIT Dispense based on patient and insurance preference. Use up to four times daily as directed. (FOR ICD-9 250.00, 250.01). 05/10/18   Minette Brine, FNP  Blood Glucose Monitoring Suppl (TRUE METRIX METER) w/Device KIT 1 each by Does not apply route in the morning, at noon, in the evening, and at bedtime. 05/22/19   Minette Brine, FNP  Evolocumab (REPATHA SURECLICK) 778 MG/ML SOAJ Inject 140 mg into the skin every 14 (fourteen) days. 06/30/19   Elouise Munroe, MD  glucose blood (TRUE METRIX BLOOD GLUCOSE TEST) test strip Check blood sugar 4 times a day before meals and bedtime 05/22/19   Minette Brine, FNP  Insulin Glargine-Lixisenatide (SOLIQUA) 100-33 UNT-MCG/ML SOPN Inject 25 Units into the skin daily. Patient taking differently: Inject 30 Units into the skin daily.  07/14/19   Minette Brine, FNP  Multiple Vitamin (MULTIVITAMIN PO) Take 1 tablet by mouth daily at 12 noon.     [provider]  Omega-3 Fatty Acids (OMEGA-3 PLUS PO) Take 2 capsules by mouth daily at 12 noon.    [provider]     Vital Signs: BP (!) 162/67   Pulse 95   Temp 98.8 F (37.1 C) (Oral)   Resp 20   Ht 5' 1"  (1.549 m)   Wt 172 lb 9.9 oz (78.3 kg)   SpO2 100%   BMI 32.62 kg/m   Physical Exam Vitals and nursing note reviewed.  Constitutional:      General:  She is not in acute distress.    Comments: Tracheostomy without sedation.   Pulmonary:     Effort: Pulmonary effort is normal. No respiratory distress.     Comments: Tracheostomy without sedation.  Skin:    General: Skin is warm and dry.     Comments: Right groin puncture site soft without active bleeding or hematoma. Right radial puncture site soft without active bleeding or hematoma.  Neurological:      Mental Status: She is alert.     Comments: Tracheostomy without sedation. More alert today, (mouthing words), follows simple commands. PERRL bilaterally. Follows commands with all extremities (right side moving spontaneously, bends LUE at elbow and lifts off bed/holds, can bend LLE at knee).     Imaging: No results found.  Labs:  CBC: Recent Labs    10/07/19 0617 10/09/19 0731 10/10/19 0507 10/13/19 0614  WBC 7.0 4.8 5.2 4.2  HGB 6.6* 7.6* 8.4* 8.5*  HCT 23.5* 27.4* 29.0* 29.5*  PLT 262 274 307 310    COAGS: Recent Labs    11/27/18 0728 08/18/19 0705 08/26/19 1715 08/27/19 0230  INR 0.9 0.9 1.1 1.2    BMP: Recent Labs    10/04/19 0500 10/06/19 1155 10/10/19 0507 10/13/19 0614  NA 140 143 141 138  K 4.0 3.8 3.3* 3.5  CL 100 103 104 103  CO2 32 29 29 32  GLUCOSE 260* 223* 145* 153*  BUN 20 16 <5* 8  CALCIUM 9.0 9.2 9.1 8.6*  CREATININE 0.69 0.71 0.55 0.48  GFRNONAA >60 >60 >60 >60  GFRAA >60 >60 >60 >60    LIVER FUNCTION TESTS: Recent Labs    09/17/19 0703 09/17/19 0703 09/20/19 0500 09/21/19 0500 09/26/19 0537 10/06/19 1155  BILITOT 1.1  --  0.7 0.3 0.5  --   AST 32  --  27 31 31   --   ALT 23  --  29 36 42  --   ALKPHOS 82  --  95 81 57  --   PROT 6.6  --  7.4 7.1 5.5*  --   ALBUMIN 1.6*   < > 1.5* 1.6* 1.8* 2.5*   < > = values in this interval not displayed.    Assessment and Plan:  Right ICA cervical/petrous junction stenosis s/p revascularization using balloon angioplasty via right radial and right femoral approach6/14/2021by Dr. Estanislado Pandy. Mild-moderate right thigh (with some retroperitoneal component) hematoma. Acute blood loss anemia, Jehovah's Witness. Patient's conditionslightly improved-tracheostomy withoutsedation, more alert today (mouthing words), follows simple commands, moving all extremities with weakness of left side. Right groinand right radial puncture sitesstable, no signs of active bleeding. Hgbgrossly stable  at 8.5 10/13/2019-receiving supplementation per severe anemia protocol. For possible transfer toSNF (insurance denied LTAC),awaitingfamily decision regarding SNF placement. Continue to hold Brilinta/Aspirin/Lovenox.Agree with [facility] plans toavoid excess blood drawl. Further plans perTRH- appreciate and agree with management. NIR to follow.   Electronically Signed: Earley Abide, PA-C 10/15/2019, 10:15 AM   I spent a total of 15 Minutes at the the patient's bedside AND on the patient's [name] floor or unit, greater than 50% of which was counseling/coordinating care for right ICA stenosis s/p revascularization.

## 2019-10-16 DIAGNOSIS — I6521 Occlusion and stenosis of right carotid artery: Secondary | ICD-10-CM | POA: Diagnosis not present

## 2019-10-16 LAB — CBC
HCT: 29.7 % — ABNORMAL LOW (ref 36.0–46.0)
Hemoglobin: 8.6 g/dL — ABNORMAL LOW (ref 12.0–15.0)
MCH: 29.1 pg (ref 26.0–34.0)
MCHC: 29 g/dL — ABNORMAL LOW (ref 30.0–36.0)
MCV: 100.3 fL — ABNORMAL HIGH (ref 80.0–100.0)
Platelets: 324 10*3/uL (ref 150–400)
RBC: 2.96 MIL/uL — ABNORMAL LOW (ref 3.87–5.11)
RDW: 17.3 % — ABNORMAL HIGH (ref 11.5–15.5)
WBC: 5.5 10*3/uL (ref 4.0–10.5)
nRBC: 0 % (ref 0.0–0.2)

## 2019-10-16 LAB — GLUCOSE, CAPILLARY
Glucose-Capillary: 127 mg/dL — ABNORMAL HIGH (ref 70–99)
Glucose-Capillary: 134 mg/dL — ABNORMAL HIGH (ref 70–99)
Glucose-Capillary: 138 mg/dL — ABNORMAL HIGH (ref 70–99)
Glucose-Capillary: 195 mg/dL — ABNORMAL HIGH (ref 70–99)
Glucose-Capillary: 201 mg/dL — ABNORMAL HIGH (ref 70–99)
Glucose-Capillary: 204 mg/dL — ABNORMAL HIGH (ref 70–99)

## 2019-10-16 LAB — BASIC METABOLIC PANEL
Anion gap: 9 (ref 5–15)
BUN: 13 mg/dL (ref 8–23)
CO2: 30 mmol/L (ref 22–32)
Calcium: 9.1 mg/dL (ref 8.9–10.3)
Chloride: 99 mmol/L (ref 98–111)
Creatinine, Ser: 0.68 mg/dL (ref 0.44–1.00)
GFR calc Af Amer: >60 mL/min (ref >60)
GFR calc non Af Amer: >60 mL/min (ref >60)
Glucose, Bld: 224 mg/dL — ABNORMAL HIGH (ref 70–99)
Potassium: 4.2 mmol/L (ref 3.5–5.1)
Sodium: 138 mmol/L (ref 135–145)

## 2019-10-16 MED ORDER — INSULIN GLARGINE 100 UNIT/ML ~~LOC~~ SOLN
15.0000 [IU] | Freq: Every day | SUBCUTANEOUS | Status: DC
  Administered 2019-10-16 – 2019-10-23 (×9): 15 [IU] via SUBCUTANEOUS
  Filled 2019-10-16 (×10): qty 0.15

## 2019-10-16 NOTE — Progress Notes (Signed)
Referring Physician(s): Melvenia Beam  Supervising Physician: Luanne Bras  Patient Status:  [facility] - In-pt  Chief Complaint: None-tracheostomy without sedation  Subjective:  Right ICA cervical/petrous junction stenosis s/p revascularization using balloon angioplasty via right radial and right femoral approach6/14/2021by Dr. Estanislado Pandy. Mild-moderate right thigh (with some retroperitoneal component) hematoma. Acute blood loss anemia, Jehovah's Witness. Patientawakelaying in bed, tracheostomy in place,no sedation.Shefollowssimple commandsand ismore alert today Right groin and right radial puncture sites c/d/i. Most recenthgb 8.6 today No family at bedside this AM.   Allergies: Patient has no known allergies.  Medications:  Current Facility-Administered Medications:  .  0.9 %  sodium chloride infusion, 250 mL, Intravenous, Continuous, Akingbade, Samuella Cota, MD, Stopped at 10/09/19 0900 .  acetaminophen (TYLENOL) tablet 650 mg, 650 mg, Oral, Q4H PRN, 650 mg at 09/27/19 0934 **OR** acetaminophen (TYLENOL) 160 MG/5ML solution 650 mg, 650 mg, Per Tube, Q4H PRN, 650 mg at 10/16/19 0600 **OR** acetaminophen (TYLENOL) suppository 650 mg, 650 mg, Rectal, Q4H PRN, [name], Sanjeev, MD .  albuterol (PROVENTIL) (2.5 MG/3ML) 0.083% nebulizer solution 2.5 mg, 2.5 mg, Nebulization, Q4H PRN, Anders Simmonds, MD, 2.5 mg at 10/06/19 1022 .  amLODipine (NORVASC) tablet 10 mg, 10 mg, Per Tube, Daily, Jacky Kindle, MD, 10 mg at 10/15/19 1241 .  atorvastatin (LIPITOR) tablet 40 mg, 40 mg, Per NG tube, Daily, Jacky Kindle, MD, 40 mg at 10/15/19 1241 .  budesonide (PULMICORT) nebulizer solution 0.5 mg, 0.5 mg, Nebulization, BID, Cyndia Skeeters, Taye T, MD, 0.5 mg at 10/16/19 0823 .  chlorhexidine gluconate (MEDLINE KIT) (PERIDEX) 0.12 % solution 15 mL, 15 mL, Mouth Rinse, BID, Chand, Sudham, MD, 15 mL at 10/16/19 0758 .  Chlorhexidine Gluconate Cloth 2 % PADS 6 each, 6 each, Topical, Daily,  Luanne Bras, MD, 6 each at 10/15/19 2138 .  famotidine (PEPCID) tablet 20 mg, 20 mg, Per Tube, BID, Agarwala, Ravi, MD, 20 mg at 10/15/19 2137 .  feeding supplement (JEVITY 1.2 CAL) liquid 1,000 mL, 1,000 mL, Per Tube, Continuous, Dahal, Binaya, MD, Last Rate: 50 mL/hr at 10/15/19 1900, Restarted at 10/15/19 1900 .  feeding supplement (PROSource TF) liquid 45 mL, 45 mL, Per Tube, TID, Brand Males, MD, 45 mL at 10/15/19 2137 .  fentaNYL (SUBLIMAZE) injection 25-100 mcg, 25-100 mcg, Intravenous, Q2H PRN, Rigoberto Noel, MD, 100 mcg at 10/12/19 1535 .  ferrous sulfate 220 (44 Fe) MG/5ML solution 220 mg, 220 mg, Per Tube, TID WC, Chand, Sudham, MD, 220 mg at 10/15/19 2042 .  hydrALAZINE (APRESOLINE) injection 10-20 mg, 10-20 mg, Intravenous, Q4H PRN, Frederik Pear, MD, 20 mg at 10/11/19 0204 .  hydrALAZINE (APRESOLINE) tablet 100 mg, 100 mg, Per Tube, Q6H, Vann, Jessica U, DO, 100 mg at 10/16/19 0559 .  hydrocerin (EUCERIN) cream, , Topical, TID, Geradine Girt, DO, Given at 10/15/19 2138 .  insulin aspart (novoLOG) injection 0-15 Units, 0-15 Units, Subcutaneous, Q4H, Shah, Pratik D, DO, 5 Units at 10/16/19 0757 .  insulin aspart (novoLOG) injection 4 Units, 4 Units, Subcutaneous, Q4H, Shah, Pratik D, DO, 4 Units at 10/16/19 0757 .  insulin glargine (LANTUS) injection 15 Units, 15 Units, Subcutaneous, Daily, Danford, Christopher P, MD .  iohexol (OMNIPAQUE) 300 MG/ML solution 50 mL, 50 mL, Per Tube, Once PRN, Sandi Mariscal, MD .  ipratropium-albuterol (DUONEB) 0.5-2.5 (3) MG/3ML nebulizer solution 3 mL, 3 mL, Nebulization, TID, Dahal, Binaya, MD, 3 mL at 10/16/19 0823 .  labetalol (NORMODYNE) injection 10 mg, 10 mg, Intravenous, Q2H PRN, Phillips Odor, MD, 10  mg at 10/08/19 2122 .  lip balm (CARMEX) ointment, , Topical, PRN, Heath Lark D, DO, 1 application at 91/50/56 0514 .  MEDLINE mouth rinse, 15 mL, Mouth Rinse, 10 times per day, Jacky Kindle, MD, 15 mL at 10/16/19 0600 .   oxyCODONE (Oxy IR/ROXICODONE) immediate release tablet 5 mg, 5 mg, Per Tube, Q6H PRN, Brand Males, MD, 5 mg at 10/11/19 1731 .  QUEtiapine (SEROQUEL) tablet 100 mg, 100 mg, Per Tube, QHS, Ramaswamy, Murali, MD, 100 mg at 10/15/19 2137 .  Resource ThickenUp Clear, , Oral, PRN, Danford, Suann Larry, MD .  sodium chloride flush (NS) 0.9 % injection 10-40 mL, 10-40 mL, Intracatheter, Q12H, [name], Sanjeev, MD, 10 mL at 10/15/19 2137 .  sodium chloride flush (NS) 0.9 % injection 10-40 mL, 10-40 mL, Intracatheter, PRN, Luanne Bras, MD, 10 mL at 09/14/19 2242    Vital Signs: BP (!) 151/55 (BP Location: Left Arm)   Pulse (!) 107   Temp 98.2 F (36.8 C) (Axillary)   Resp 16   Ht 5' 1"  (1.549 m)   Wt 74.7 kg   SpO2 100%   BMI 31.12 kg/m   Physical Exam Vitals and nursing note reviewed.  Constitutional:      General: She is not in acute distress.    Comments: Tracheostomy without sedation.   Pulmonary:     Effort: Pulmonary effort is normal. No respiratory distress.     Comments: Tracheostomy without sedation.  Skin:    General: Skin is warm and dry.  Neurological:     Mental Status: She is alert.     Comments: Tracheostomy without sedation. More alert today, (mouthing words), follows simple commands. PERRL bilaterally. Follows commands with all extremities (right side moving spontaneously, bends LUE at elbow and can squeeze hand. No LLE mvmt this am.     Imaging: DG Swallowing Func-Speech Pathology  Result Date: 10/15/2019 Objective Swallowing Evaluation: Type of Study: MBS-Modified Barium Swallow Study  Patient Details Name: Donna [name] MRN: [id number] Date of Birth: [birth year] Today's Date: 10/15/2019 Time: SLP Start Time (ACUTE ONLY): 1146 -SLP Stop Time (ACUTE ONLY): 5374 SLP Time Calculation (min) (ACUTE ONLY): 18 min Past Medical History: Past Medical History: Diagnosis Date . Anemia   low iron - not since having fibroids removed . Arthritis  . Hyperlipidemia  .  Hypertension  . Sleep apnea   just recently diagnosed with it, has not gotten the Cpap (done 10/29/18) . Stroke [facility])   weakness on left side . Type II diabetes mellitus ([place])  Past Surgical History: Past Surgical History: Procedure Laterality Date . COLONOSCOPY   . IR ANGIO INTRA EXTRACRAN SEL COM CAROTID INNOMINATE BILAT MOD SED  10/18/2018 . IR ANGIO INTRA EXTRACRAN SEL COM CAROTID INNOMINATE BILAT MOD SED  08/18/2019 . IR ANGIO VERTEBRAL SEL SUBCLAVIAN INNOMINATE UNI R MOD SED  08/18/2019 . IR ANGIO VERTEBRAL SEL VERTEBRAL BILAT MOD SED  10/18/2018 . IR ANGIO VERTEBRAL SEL VERTEBRAL UNI L MOD SED  08/18/2019 . IR GASTROSTOMY TUBE MOD SED  10/10/2019 . IR PTA INTRACRANIAL  08/25/2019 . IR US GUIDE VASC ACCESS RIGHT  10/18/2018 . IR US GUIDE VASC ACCESS RIGHT  08/18/2019 . IR US GUIDE VASC ACCESS RIGHT  08/25/2019 . MYOMECTOMY   . RADIOLOGY WITH ANESTHESIA N/A 08/18/2019  Procedure: STENTING;  Surgeon: Luanne Bras, MD;  Location: [place];  Service: Radiology;  Laterality: N/A; . RADIOLOGY WITH ANESTHESIA N/A 08/25/2019  Procedure: STENTING;  Surgeon: Luanne Bras, MD;  Location: [place];  Service:  Radiology;  Laterality: N/A; HPI: 69 y.o. female with PMH significant for HTN, HLD, CVA 06/2018, DM, anemia, obesity, OSA, and OA. Pt underwent R ICA angioplasty on 6/14. Pt with decline in neuro status and hemoglobin on 6/15, found to have a large R groin hematoma.  Pt has had Hbg 5.0 or less and is a Jehovah's witness. MRI showed scattered small acute infarcts along the right MCA/watershed territory. Solitary small acute infarct in the left parietal cortex and right cerebellum. She was intubated on 6/19. Pt underwent tracheostomy on 7/3. PT found to be septic with RLL PNA on 7/6.  MBS 7/22 - started on dys1/nectars.  Patient now with a Shiley #6 cuffless trach in place.  She has planned PEG placement 7/30.   Subjective: pt alert, upright in MBS chair, agreeable to evaluation Assessment / Plan / Recommendation CHL IP CLINICAL  IMPRESSIONS 10/15/2019 Clinical Impression Pt demonstrated min-mild oropharyngeal dysphagia and improved from prior MBS. Orally she exhibited dfficulty maintaining bolus resulting in sublinual aggregation and spill to vallculae. Puree bolus reached his pyriform sinuses and remained for 5 seconds before swallow onset. Trace and mostly flash penetration with thin and nectar via straw. Smaller sips were effective however given oral impairments, cognitive deficits and respiratory compromise, recommend initiating nectar thick liquids. Mildly prolonged mastication with cracker. Recommend Dys 2, nectar thick liquids, pills whole in puree and full supervision/assist.      SLP Visit Diagnosis Dysphagia, oropharyngeal phase (R13.12) Attention and concentration deficit following -- Frontal lobe and executive function deficit following -- Impact on safety and function Mild aspiration risk;Moderate aspiration risk   CHL IP TREATMENT RECOMMENDATION 10/15/2019 Treatment Recommendations Therapy as outlined in treatment plan below   Prognosis 10/15/2019 Prognosis for Safe Diet Advancement Good Barriers to Reach Goals Cognitive deficits Barriers/Prognosis Comment -- CHL IP DIET RECOMMENDATION 10/15/2019 SLP Diet Recommendations Nectar thick liquid;Dysphagia 2 (Fine chop) solids Liquid Administration via Cup;Straw Medication Administration Crushed with puree Compensations Slow rate;Small sips/bites;Other (Comment);Clear throat intermittently Postural Changes Remain semi-upright after after feeds/meals (Comment)   CHL IP OTHER RECOMMENDATIONS 10/15/2019 Recommended Consults -- Oral Care Recommendations Oral care BID Other Recommendations --   CHL IP FOLLOW UP RECOMMENDATIONS 10/15/2019 Follow up Recommendations Skilled Nursing facility   [facility] IP FREQUENCY AND DURATION 10/15/2019 Speech Therapy Frequency (ACUTE ONLY) min 2x/week Treatment Duration 2 weeks      CHL IP ORAL PHASE 10/15/2019 Oral Phase Impaired Oral - Pudding Teaspoon -- Oral - Pudding Cup  -- Oral - Honey Teaspoon -- Oral - Honey Cup -- Oral - Nectar Teaspoon -- Oral - Nectar Cup Decreased bolus cohesion;Other (Comment) Oral - Nectar Straw Decreased bolus cohesion Oral - Thin Teaspoon -- Oral - Thin Cup Other (Comment);Decreased bolus cohesion Oral - Thin Straw Decreased bolus cohesion Oral - Puree Delayed oral transit Oral - Mech Soft -- Oral - Regular (No Data) Oral - Multi-Consistency -- Oral - Pill -- Oral Phase - Comment --  CHL IP PHARYNGEAL PHASE 10/15/2019 Pharyngeal Phase Impaired Pharyngeal- Pudding Teaspoon -- Pharyngeal -- Pharyngeal- Pudding Cup -- Pharyngeal -- Pharyngeal- Honey Teaspoon -- Pharyngeal -- Pharyngeal- Honey Cup -- Pharyngeal -- Pharyngeal- Nectar Teaspoon NT Pharyngeal -- Pharyngeal- Nectar Cup WFL Pharyngeal -- Pharyngeal- Nectar Straw Penetration/Aspiration during swallow Pharyngeal Material enters airway, remains ABOVE vocal cords then ejected out Pharyngeal- Thin Teaspoon NT Pharyngeal -- Pharyngeal- Thin Cup WFL Pharyngeal -- Pharyngeal- Thin Straw Penetration/Aspiration during swallow Pharyngeal Material enters airway, remains ABOVE vocal cords then ejected out;Material enters airway, remains ABOVE vocal cords  and not ejected out Pharyngeal- Puree Delayed swallow initiation-vallecula Pharyngeal -- Pharyngeal- Mechanical Soft -- Pharyngeal -- Pharyngeal- Regular Pharyngeal residue - valleculae Pharyngeal -- Pharyngeal- Multi-consistency -- Pharyngeal -- Pharyngeal- Pill -- Pharyngeal -- Pharyngeal Comment --  CHL IP CERVICAL ESOPHAGEAL PHASE 10/15/2019 Cervical Esophageal Phase WFL Pudding Teaspoon -- Pudding Cup -- Honey Teaspoon -- Honey Cup -- Nectar Teaspoon -- Nectar Cup -- Nectar Straw -- Thin Teaspoon -- Thin Cup -- Thin Straw -- Puree -- Mechanical Soft -- Regular -- Multi-consistency -- Pill -- Cervical Esophageal Comment -- [place] Siren 10/15/2019, 1:24 PM  Orbie Pyo Litaker M.Ed CCC-SLP Speech-Language Pathologist Pager [phone] Office [phone]               Labs:  CBC: Recent Labs    10/09/19 0731 10/10/19 0507 10/13/19 0614 10/16/19 0400  WBC 4.8 5.2 4.2 5.5  HGB 7.6* 8.4* 8.5* 8.6*  HCT 27.4* 29.0* 29.5* 29.7*  PLT 274 307 310 324    COAGS: Recent Labs    11/27/18 0728 08/18/19 0705 08/26/19 1715 08/27/19 0230  INR 0.9 0.9 1.1 1.2    BMP: Recent Labs    10/06/19 1155 10/10/19 0507 10/13/19 0614 10/16/19 0400  NA 143 141 138 138  K 3.8 3.3* 3.5 4.2  CL 103 104 103 99  CO2 29 29 32 30  GLUCOSE 223* 145* 153* 224*  BUN 16 <5* 8 13  CALCIUM 9.2 9.1 8.6* 9.1  CREATININE 0.71 0.55 0.48 0.68  GFRNONAA >60 >60 >60 >60  GFRAA >60 >60 >60 >60    LIVER FUNCTION TESTS: Recent Labs    09/17/19 0703 09/17/19 0703 09/20/19 0500 09/21/19 0500 09/26/19 0537 10/06/19 1155  BILITOT 1.1  --  0.7 0.3 0.5  --   AST 32  --  27 31 31   --   ALT 23  --  29 36 42  --   ALKPHOS 82  --  95 81 57  --   PROT 6.6  --  7.4 7.1 5.5*  --   ALBUMIN 1.6*   < > 1.5* 1.6* 1.8* 2.5*   < > = values in this interval not displayed.    Assessment and Plan:  Right ICA cervical/petrous junction stenosis s/p revascularization using balloon angioplasty via right radial and right femoral approach6/14/2021by Dr. Estanislado Pandy. Mild-moderate right thigh (with some retroperitoneal component) hematoma. Acute blood loss anemia, Jehovah's Witness. Patient's conditionslightly improved-tracheostomy withoutsedation, more alert today (mouthing words), follows simple commands, moving all extremities with weakness of left side. Right groinand right radial puncture sitesstable, no signs of active bleeding. Hgbgrossly stable at 8.6 Continue to hold Brilinta/Aspirin/Lovenox.Agree with TRH plans toavoid excess blood drawl. Further plans perTRH- appreciate and agree with management. NIR to follow.   Electronically Signed: Ascencion Dike, PA-C 10/16/2019, 9:58 AM   I spent a total of 15 Minutes at the the patient's bedside AND on the patient's  hospital floor or unit, greater than 50% of which was counseling/coordinating care for right ICA stenosis s/p revascularization.

## 2019-10-16 NOTE — TOC Progression Note (Signed)
Transition of Care [facility]) - Progression Note    Patient Details  Name: Donna [name] MRN: [id number] Date of Birth: 1951-02-26  Transition of Care [facility]) CM/SW Contact  Mearl Latin, LCSW Phone Number: 10/16/2019, 9:21 AM  Clinical Narrative:    CSW contacted patient's son [place]. No voicemail available but patient's son called right back. He received the information about the 4 SNF facility options and he will speak to his Aunt to discuss the options since he is 4 hours away at work. CSW emphasized that we would need to get insurance approval as well and to provide choices as soon as possible. He stated he will call CSW back.   Expected Discharge Plan: Skilled Nursing Facility Barriers to Discharge: Continued Medical Work up  Expected Discharge Plan and Services Expected Discharge Plan: Skilled Nursing Facility                                               Social Determinants of Health (SDOH) Interventions    Readmission Risk Interventions No flowsheet data found.

## 2019-10-16 NOTE — Plan of Care (Signed)
Discussed with patient plan of care for the evening, pain management and trach care with some teach back displayed

## 2019-10-16 NOTE — Progress Notes (Signed)
PROGRESS NOTE    ATTALLAH ONTKO  TDS:[id number] DOB: 1951-03-01 DOA: 08/25/2019 PCP: Minette Brine, FNP      Brief Narrative:  Donna [name] is a 69 y.o. F with hx HTN, CVA, residual left hemiparesis, DM, anemia, obesity, and cerebrovascular disease admitted mid-June for right ICA petrous/cervical junction angioplasty and stent due to symptomatic severe intracranial stenosis but post-operatively, developed confusion, worsening left hemiparesis night after procedure.  Medicine and Neurology were consulted, patient noted to have had hypotension and new anemia, and subsequent MRI showed evidence of watershed infarcts.  CT abdomen showed small RP hematoma.  She was transferred to the ICU on neosynephrine.  Patient stabilized until around 6/19 when she was intubated due to pulmonary edema and severe encephalopathy.  Failed extubation 6/28, tracheostomy 7/3.  Subsequently stabilized, denied for LTAC.  7/25, patient again developed respiratory distress, CTA chest showed GGO in upper lobes, patient treated for aspiration pneumonia.  7/30 PEG placed.  See summary note from 8/3.        Assessment & Plan:  Watershed infarcts, right MCA territory -Continue atorvastatin -Continue to hold aspirin, Brilinta, Lovenox   Acute metabolic encephalopathy -Continue famotidine -Continue tube feeds -Continue Seroquel  Chronic respiratory failure -Pulmonary trach team following -Continue Pulmicort, Duonebs  Anemia Previously received folate, B12, and Epogen Hgb no change -Continue iron  -Hold aspirin, Brilinta -Avoid excess blood draws  Diabetes Glucoses slightly high -Continue Lantus, increase dose -Continue SS corrections  Hypertension BP mildly elevated -Continue amlodipine, hydralazine            Disposition: Status is: Inpatient  Remains inpatient appropriate because:Unsafe d/c plan   Dispo: The patient is from: Home              Anticipated d/c is to: TBD               Anticipated d/c date is: > 3 days              Patient currently is medically stable to d/c. to a lower level of care if appropriate resources can be put in place              MDM: The below labs and imaging reports were reviewed and summarized above.  Medication management as above.   DVT prophylaxis: Place and maintain sequential compression device Start: 09/26/19 1319  Code Status: FULL Family Communication: Sister at bedside and son/POA by phone            Subjective: No fever, no increased respiratory effort, no obvious pain complaints, no vomiting or diarrhea.  Objective: Vitals:   10/16/19 0716 10/16/19 0800 10/16/19 0824 10/16/19 1201  BP: (!) 151/55   (!) 149/60  Pulse:  (!) 107    Resp:  16    Temp: 98.2 F (36.8 C)   99.1 F (37.3 C)  TempSrc: Axillary   Oral  SpO2:  100% 100% 100%  Weight:      Height:        Intake/Output Summary (Last 24 hours) at 10/16/2019 1445 Last data filed at 10/16/2019 1237 Gross per 24 hour  Intake 566.5 ml  Output 1650 ml  Net -1083.5 ml   Filed Weights   10/14/19 0309 10/15/19 0316 10/16/19 0630  Weight: 77 kg 78.3 kg 74.7 kg    Examination: General appearance:  adult female, alert and in no obvious distress.  Tracheostomy, lying in bed, makes eye contact after I prompt her, then stares off, intermittently shakes head to questions,  but not reliably HEENT: Anicteric, conjunctiva pink, lids and lashes normal. No nasal deformity, discharge, epistaxis.  Tracheostomy in place with oxygen blow-by, no secretions from trach Skin: Warm and dry.  No suspicious rashes or lesions. Cardiac: RRR, nl S1-S2, no murmurs appreciated.  Capillary refill is brisk.  JVP not visible.  No LE edema.  Radial pulses 2+ and symmetric. Respiratory: Normal respiratory rate and rhythm.  CTAB without rales or wheezes. Abdomen: Abdomen soft.  No TTP or guarding. No ascites, distension, hepatosplenomegaly.   MSK: No deformities or  effusions. Neuro: Awake, intermittently makes eye contact, 2/5 strength in bilateral upper extremities, legs flaccid, no verbalizations Psych: Unable to assess    Data Reviewed: I have personally reviewed following labs and imaging studies:  CBC: Recent Labs  Lab 10/10/19 0507 10/13/19 0614 10/16/19 0400  WBC 5.2 4.2 5.5  NEUTROABS 3.4 2.4  --   HGB 8.4* 8.5* 8.6*  HCT 29.0* 29.5* 29.7*  MCV 99.7 99.7 100.3*  PLT 307 310 250   Basic Metabolic Panel: Recent Labs  Lab 10/10/19 0507 10/13/19 0614 10/16/19 0400  NA 141 138 138  K 3.3* 3.5 4.2  CL 104 103 99  CO2 29 32 30  GLUCOSE 145* 153* 224*  BUN <5* 8 13  CREATININE 0.55 0.48 0.68  CALCIUM 9.1 8.6* 9.1  MG 1.8 1.9  --   PHOS 2.9 3.6  --    GFR: Estimated Creatinine Clearance: 62.3 mL/min (by C-G formula based on SCr of 0.68 mg/dL). Liver Function Tests: No results for input(s): AST, ALT, ALKPHOS, BILITOT, PROT, ALBUMIN in the last 168 hours. No results for input(s): LIPASE, AMYLASE in the last 168 hours. No results for input(s): AMMONIA in the last 168 hours. Coagulation Profile: No results for input(s): INR, PROTIME in the last 168 hours. Cardiac Enzymes: No results for input(s): CKTOTAL, CKMB, CKMBINDEX, TROPONINI in the last 168 hours. BNP (last 3 results) No results for input(s): PROBNP in the last 8760 hours. HbA1C: No results for input(s): HGBA1C in the last 72 hours. CBG: Recent Labs  Lab 10/15/19 2000 10/15/19 2341 10/16/19 0402 10/16/19 0718 10/16/19 1157  GLUCAP 184* 156* 204* 201* 138*   Lipid Profile: No results for input(s): CHOL, HDL, LDLCALC, TRIG, CHOLHDL, LDLDIRECT in the last 72 hours. Thyroid Function Tests: No results for input(s): TSH, T4TOTAL, FREET4, T3FREE, THYROIDAB in the last 72 hours. Anemia Panel: No results for input(s): VITAMINB12, FOLATE, FERRITIN, TIBC, IRON, RETICCTPCT in the last 72 hours. Urine analysis:    Component Value Date/Time   COLORURINE AMBER (A)  09/16/2019 0931   APPEARANCEUR HAZY (A) 09/16/2019 0931   LABSPEC 1.021 09/16/2019 0931   PHURINE 5.0 09/16/2019 0931   GLUCOSEU NEGATIVE 09/16/2019 0931   HGBUR NEGATIVE 09/16/2019 0931   BILIRUBINUR NEGATIVE 09/16/2019 0931   BILIRUBINUR negative 01/02/2018 1636   KETONESUR NEGATIVE 09/16/2019 0931   PROTEINUR 30 (A) 09/16/2019 0931   UROBILINOGEN 0.2 01/02/2018 1636   NITRITE NEGATIVE 09/16/2019 0931   LEUKOCYTESUR SMALL (A) 09/16/2019 0931   Sepsis Labs: '@LABRCNTIP'$ (procalcitonin:4,lacticacidven:4)  ) Recent Results (from the past 240 hour(s))  Culture, respiratory (non-expectorated)     Status: None   Collection Time: 10/08/19  2:15 AM   Specimen: Tracheal Aspirate; Respiratory  Result Value Ref Range Status   Specimen Description TRACHEAL ASPIRATE  Final   Special Requests NONE  Final   Gram Stain   Final    MODERATE WBC PRESENT, PREDOMINANTLY PMN FEW GRAM NEGATIVE COCCOBACILLI RARE GRAM POSITIVE COCCI IN  PAIRS Performed at [facility], [place] [address], [place], [place] [address]    Culture RARE [facility] MORGANII  Final   Report Status 10/10/2019 FINAL  Final   Organism ID, Bacteria MORGANELLA MORGANII  Final      Susceptibility   Morganella morganii - MIC*    AMPICILLIN >=32 RESISTANT Resistant     CEFAZOLIN >=64 RESISTANT Resistant     CEFTAZIDIME <=1 SENSITIVE Sensitive     CIPROFLOXACIN <=0.25 SENSITIVE Sensitive     GENTAMICIN <=1 SENSITIVE Sensitive     IMIPENEM 2 SENSITIVE Sensitive     TRIMETH/SULFA <=20 SENSITIVE Sensitive     AMPICILLIN/SULBACTAM 16 INTERMEDIATE Intermediate     PIP/TAZO <=4 SENSITIVE Sensitive     * RARE [facility] MORGANII         Radiology Studies: DG Swallowing Func-Speech Pathology  Result Date: 10/15/2019 Objective Swallowing Evaluation: Type of Study: MBS-Modified Barium Swallow Study  Patient Details Name: Donna [name] MRN: [id number] Date of Birth: 1950/10/07 Today's Date: 10/15/2019 Time: SLP Start Time (ACUTE  ONLY): 1146 -SLP Stop Time (ACUTE ONLY): 2536 SLP Time Calculation (min) (ACUTE ONLY): 18 min Past Medical History: Past Medical History: Diagnosis Date . Anemia   low iron - not since having fibroids removed . Arthritis  . Hyperlipidemia  . Hypertension  . Sleep apnea   just recently diagnosed with it, has not gotten the Cpap (done 10/29/18) . Stroke [facility])   weakness on left side . Type II diabetes mellitus ([place])  Past Surgical History: Past Surgical History: Procedure Laterality Date . COLONOSCOPY   . IR ANGIO INTRA EXTRACRAN SEL COM CAROTID INNOMINATE BILAT MOD SED  10/18/2018 . IR ANGIO INTRA EXTRACRAN SEL COM CAROTID INNOMINATE BILAT MOD SED  08/18/2019 . IR ANGIO VERTEBRAL SEL SUBCLAVIAN INNOMINATE UNI R MOD SED  08/18/2019 . IR ANGIO VERTEBRAL SEL VERTEBRAL BILAT MOD SED  10/18/2018 . IR ANGIO VERTEBRAL SEL VERTEBRAL UNI L MOD SED  08/18/2019 . IR GASTROSTOMY TUBE MOD SED  10/10/2019 . IR PTA INTRACRANIAL  08/25/2019 . IR US GUIDE VASC ACCESS RIGHT  10/18/2018 . IR US GUIDE VASC ACCESS RIGHT  08/18/2019 . IR US GUIDE VASC ACCESS RIGHT  08/25/2019 . MYOMECTOMY   . RADIOLOGY WITH ANESTHESIA N/A 08/18/2019  Procedure: STENTING;  Surgeon: Luanne Bras, MD;  Location: [place];  Service: Radiology;  Laterality: N/A; . RADIOLOGY WITH ANESTHESIA N/A 08/25/2019  Procedure: STENTING;  Surgeon: Luanne Bras, MD;  Location: [place];  Service: Radiology;  Laterality: N/A; HPI: 69 y.o. female with PMH significant for HTN, HLD, CVA 06/2018, DM, anemia, obesity, OSA, and OA. Pt underwent R ICA angioplasty on 6/14. Pt with decline in neuro status and hemoglobin on 6/15, found to have a large R groin hematoma.  Pt has had Hbg 5.0 or less and is a Jehovah's witness. MRI showed scattered small acute infarcts along the right MCA/watershed territory. Solitary small acute infarct in the left parietal cortex and right cerebellum. She was intubated on 6/19. Pt underwent tracheostomy on 7/3. PT found to be septic with RLL PNA on 7/6.  MBS 7/22 -  started on dys1/nectars.  Patient now with a Shiley #6 cuffless trach in place.  She has planned PEG placement 7/30.   Subjective: pt alert, upright in MBS chair, agreeable to evaluation Assessment / Plan / Recommendation CHL IP CLINICAL IMPRESSIONS 10/15/2019 Clinical Impression Pt demonstrated min-mild oropharyngeal dysphagia and improved from prior MBS. Orally she exhibited dfficulty maintaining bolus resulting in sublinual aggregation and spill  to vallculae. Puree bolus reached his pyriform sinuses and remained for 5 seconds before swallow onset. Trace and mostly flash penetration with thin and nectar via straw. Smaller sips were effective however given oral impairments, cognitive deficits and respiratory compromise, recommend initiating nectar thick liquids. Mildly prolonged mastication with cracker. Recommend Dys 2, nectar thick liquids, pills whole in puree and full supervision/assist.      SLP Visit Diagnosis Dysphagia, oropharyngeal phase (R13.12) Attention and concentration deficit following -- Frontal lobe and executive function deficit following -- Impact on safety and function Mild aspiration risk;Moderate aspiration risk   CHL IP TREATMENT RECOMMENDATION 10/15/2019 Treatment Recommendations Therapy as outlined in treatment plan below   Prognosis 10/15/2019 Prognosis for Safe Diet Advancement Good Barriers to Reach Goals Cognitive deficits Barriers/Prognosis Comment -- CHL IP DIET RECOMMENDATION 10/15/2019 SLP Diet Recommendations Nectar thick liquid;Dysphagia 2 (Fine chop) solids Liquid Administration via Cup;Straw Medication Administration Crushed with puree Compensations Slow rate;Small sips/bites;Other (Comment);Clear throat intermittently Postural Changes Remain semi-upright after after feeds/meals (Comment)   CHL IP OTHER RECOMMENDATIONS 10/15/2019 Recommended Consults -- Oral Care Recommendations Oral care BID Other Recommendations --   CHL IP FOLLOW UP RECOMMENDATIONS 10/15/2019 Follow up Recommendations  Skilled Nursing facility   [facility] IP FREQUENCY AND DURATION 10/15/2019 Speech Therapy Frequency (ACUTE ONLY) min 2x/week Treatment Duration 2 weeks      CHL IP ORAL PHASE 10/15/2019 Oral Phase Impaired Oral - Pudding Teaspoon -- Oral - Pudding Cup -- Oral - Honey Teaspoon -- Oral - Honey Cup -- Oral - Nectar Teaspoon -- Oral - Nectar Cup Decreased bolus cohesion;Other (Comment) Oral - Nectar Straw Decreased bolus cohesion Oral - Thin Teaspoon -- Oral - Thin Cup Other (Comment);Decreased bolus cohesion Oral - Thin Straw Decreased bolus cohesion Oral - Puree Delayed oral transit Oral - Mech Soft -- Oral - Regular (No Data) Oral - Multi-Consistency -- Oral - Pill -- Oral Phase - Comment --  CHL IP PHARYNGEAL PHASE 10/15/2019 Pharyngeal Phase Impaired Pharyngeal- Pudding Teaspoon -- Pharyngeal -- Pharyngeal- Pudding Cup -- Pharyngeal -- Pharyngeal- Honey Teaspoon -- Pharyngeal -- Pharyngeal- Honey Cup -- Pharyngeal -- Pharyngeal- Nectar Teaspoon NT Pharyngeal -- Pharyngeal- Nectar Cup WFL Pharyngeal -- Pharyngeal- Nectar Straw Penetration/Aspiration during swallow Pharyngeal Material enters airway, remains ABOVE vocal cords then ejected out Pharyngeal- Thin Teaspoon NT Pharyngeal -- Pharyngeal- Thin Cup WFL Pharyngeal -- Pharyngeal- Thin Straw Penetration/Aspiration during swallow Pharyngeal Material enters airway, remains ABOVE vocal cords then ejected out;Material enters airway, remains ABOVE vocal cords and not ejected out Pharyngeal- Puree Delayed swallow initiation-vallecula Pharyngeal -- Pharyngeal- Mechanical Soft -- Pharyngeal -- Pharyngeal- Regular Pharyngeal residue - valleculae Pharyngeal -- Pharyngeal- Multi-consistency -- Pharyngeal -- Pharyngeal- Pill -- Pharyngeal -- Pharyngeal Comment --  CHL IP CERVICAL ESOPHAGEAL PHASE 10/15/2019 Cervical Esophageal Phase WFL Pudding Teaspoon -- Pudding Cup -- Honey Teaspoon -- Honey Cup -- Nectar Teaspoon -- Nectar Cup -- Nectar Straw -- Thin Teaspoon -- Thin Cup -- Thin Straw  -- Puree -- Mechanical Soft -- Regular -- Multi-consistency -- Pill -- Cervical Esophageal Comment -- [place] Siren 10/15/2019, 1:24 PM  Orbie Pyo Litaker M.Ed Actor Pager [phone] Office [phone]                  Scheduled Meds: . amLODipine  10 mg Per Tube Daily  . atorvastatin  40 mg Per NG tube Daily  . budesonide (PULMICORT) nebulizer solution  0.5 mg Nebulization BID  . chlorhexidine gluconate (MEDLINE KIT)  15 mL Mouth Rinse BID  .  Chlorhexidine Gluconate Cloth  6 each Topical Daily  . famotidine  20 mg Per Tube BID  . feeding supplement (PROSource TF)  45 mL Per Tube TID  . ferrous sulfate  220 mg Per Tube TID WC  . hydrALAZINE  100 mg Per Tube Q6H  . hydrocerin   Topical TID  . insulin aspart  0-15 Units Subcutaneous Q4H  . insulin aspart  4 Units Subcutaneous Q4H  . insulin glargine  15 Units Subcutaneous Daily  . ipratropium-albuterol  3 mL Nebulization TID  . mouth rinse  15 mL Mouth Rinse 10 times per day  . QUEtiapine  100 mg Per Tube QHS  . sodium chloride flush  10-40 mL Intracatheter Q12H   Continuous Infusions: . sodium chloride Stopped (10/09/19 0900)  . feeding supplement (JEVITY 1.2 CAL) 1,000 mL (10/16/19 1242)     LOS: 52 days    Time spent: 25 minutes    Edwin Dada, MD [facility] 10/16/2019, 2:45 PM     Please page though [place] or Epic secure chat:  For [employer], Adult nurse

## 2019-10-17 ENCOUNTER — Telehealth: Payer: Medicare HMO

## 2019-10-17 DIAGNOSIS — I6521 Occlusion and stenosis of right carotid artery: Secondary | ICD-10-CM | POA: Diagnosis not present

## 2019-10-17 DIAGNOSIS — L8990 Pressure ulcer of unspecified site, unspecified stage: Secondary | ICD-10-CM | POA: Insufficient documentation

## 2019-10-17 LAB — GLUCOSE, CAPILLARY
Glucose-Capillary: 145 mg/dL — ABNORMAL HIGH (ref 70–99)
Glucose-Capillary: 146 mg/dL — ABNORMAL HIGH (ref 70–99)
Glucose-Capillary: 163 mg/dL — ABNORMAL HIGH (ref 70–99)
Glucose-Capillary: 163 mg/dL — ABNORMAL HIGH (ref 70–99)
Glucose-Capillary: 168 mg/dL — ABNORMAL HIGH (ref 70–99)
Glucose-Capillary: 82 mg/dL (ref 70–99)

## 2019-10-17 NOTE — TOC Progression Note (Signed)
Transition of Care [facility]) - Progression Note    Patient Details  Name: NYREE APPLEGATE MRN: [id number] Date of Birth: 1950-06-11  [facility]) CM/SW Contact  Mearl Latin, LCSW Phone Number: 10/17/2019, 11:06 AM  Clinical Narrative:    CSW unable to reach patient's son to determine SNF facility choice; sent HIPPA compliant text.   Expected Discharge Plan: Skilled Nursing Facility Barriers to Discharge: Continued Medical Work up  Expected Discharge Plan and Services Expected Discharge Plan: Skilled Nursing Facility                                               Social Determinants of Health (SDOH) Interventions    Readmission Risk Interventions No flowsheet data found.

## 2019-10-17 NOTE — Progress Notes (Signed)
Referring Physician(s): Melvenia Beam  Supervising Physician: Luanne Bras  Patient Status:  [name] - In-pt  Chief Complaint: None  Subjective:  Right ICA cervical/petrous junction stenosis s/p revascularization using balloon angioplasty via right radial and right femoral approach6/14/2021by Dr. Estanislado Pandy. Mild-moderate right thigh (with some retroperitoneal component) hematoma. Acute blood loss anemia, Jehovah's Witness. Patientawakelaying in bed, tracheostomy in place,no sedation.Shefollowssimple commandsand ismore alert today, speech clear with passy muir in place. Can move all extremities today. Right groin and right radial puncture sites c/d/i. Most recenthgbwas8.6 yesterday (grossly stable from last hgb 8.5on 10/13/2019). No family at bedside this AM.   Allergies: Patient has no known allergies.  Medications: Prior to Admission medications   Medication Sig Start Date End Date Taking? Authorizing Provider  amLODipine (NORVASC) 10 MG tablet Take 1 tablet (10 mg total) by mouth daily. 05/12/19  Yes Minette Brine, FNP  aspirin EC 81 MG tablet Take 81 mg by mouth every evening.   Yes [provider]  Calcium Carbonate-Vitamin D (CALCIUM-D PO) Take 2 tablets by mouth daily at 12 noon.    Yes [provider]  carvedilol (COREG) 6.25 MG tablet TAKE 1 TABLET(6.25 MG) BY MOUTH TWICE DAILY Patient taking differently: Take 6.25 mg by mouth 2 (two) times daily with a meal.  07/31/19  Yes Minette Brine, FNP  hydrALAZINE (APRESOLINE) 50 MG tablet Take 1 tablet (50 mg total) by mouth 2 (two) times daily. 06/16/19 09/14/19 Yes Elouise Munroe, MD  Insulin Degludec-Liraglutide (XULTOPHY) 100-3.6 UNIT-MG/ML SOPN Inject 30 Units into the skin daily. 08/14/19  Yes Minette Brine, FNP  telmisartan-hydrochlorothiazide (MICARDIS HCT) 40-12.5 MG tablet Take 1 tablet by mouth daily. 07/31/19  Yes Minette Brine, FNP  ticagrelor (BRILINTA) 90 MG TABS tablet Take 1 tablet  (90 mg total) by mouth 2 (two) times daily. 06/12/19  Yes Elouise Munroe, MD  vitamin C (ASCORBIC ACID) 250 MG tablet Take 500 mg by mouth daily at 12 noon.   Yes [provider]  blood glucose meter kit and supplies KIT Dispense based on patient and insurance preference. Use up to four times daily as directed. (FOR ICD-9 250.00, 250.01). 05/10/18   Minette Brine, FNP  Blood Glucose Monitoring Suppl (TRUE METRIX METER) w/Device KIT 1 each by Does not apply route in the morning, at noon, in the evening, and at bedtime. 05/22/19   Minette Brine, FNP  Evolocumab (REPATHA SURECLICK) 747 MG/ML SOAJ Inject 140 mg into the skin every 14 (fourteen) days. 06/30/19   Elouise Munroe, MD  glucose blood (TRUE METRIX BLOOD GLUCOSE TEST) test strip Check blood sugar 4 times a day before meals and bedtime 05/22/19   Minette Brine, FNP  Insulin Glargine-Lixisenatide (SOLIQUA) 100-33 UNT-MCG/ML SOPN Inject 25 Units into the skin daily. Patient taking differently: Inject 30 Units into the skin daily.  07/14/19   Minette Brine, FNP  Multiple Vitamin (MULTIVITAMIN PO) Take 1 tablet by mouth daily at 12 noon.     [provider]  Omega-3 Fatty Acids (OMEGA-3 PLUS PO) Take 2 capsules by mouth daily at 12 noon.    [provider]     Vital Signs: BP (!) 153/71   Pulse 100   Temp 99.1 F (37.3 C) (Oral)   Resp 19   Ht 5' 1"  (1.549 m)   Wt 163 lb 9.3 oz (74.2 kg)   SpO2 99%   BMI 30.91 kg/m   Physical Exam Vitals and nursing note reviewed.  Constitutional:  General: She is not in acute distress.    Comments: Tracheostomy without sedation, passy muir in place.  Pulmonary:     Effort: Pulmonary effort is normal. No respiratory distress.     Comments: Tracheostomy without sedation, passy muir in place. Skin:    General: Skin is warm and dry.     Comments: Right groin puncture site soft without active bleeding or hematoma. Right radial puncture site soft without active bleeding  or hematoma.   Neurological:     Mental Status: She is alert.     Comments: Tracheostomy without sedation, passy muir in place. Speech clear with passy muir. Follows simple commands. PERRL bilaterally. Follows commands with all extremities (right side moving spontaneously, bends LUE at elbow and lifts off bed, can bend LLE at knee).      Imaging: DG Swallowing Func-Speech Pathology  Result Date: 10/15/2019 Objective Swallowing Evaluation: Type of Study: MBS-Modified Barium Swallow Study  Patient Details Name: Donna [name] MRN: [id number] Date of Birth: 1950-10-31 Today's Date: 10/15/2019 Time: SLP Start Time (ACUTE ONLY): 1146 -SLP Stop Time (ACUTE ONLY): 1700 SLP Time Calculation (min) (ACUTE ONLY): 18 min Past Medical History: Past Medical History: Diagnosis Date . Anemia   low iron - not since having fibroids removed . Arthritis  . Hyperlipidemia  . Hypertension  . Sleep apnea   just recently diagnosed with it, has not gotten the Cpap (done 10/29/18) . Stroke [facility])   weakness on left side . Type II diabetes mellitus ([place])  Past Surgical History: Past Surgical History: Procedure Laterality Date . COLONOSCOPY   . IR ANGIO INTRA EXTRACRAN SEL COM CAROTID INNOMINATE BILAT MOD SED  10/18/2018 . IR ANGIO INTRA EXTRACRAN SEL COM CAROTID INNOMINATE BILAT MOD SED  08/18/2019 . IR ANGIO VERTEBRAL SEL SUBCLAVIAN INNOMINATE UNI R MOD SED  08/18/2019 . IR ANGIO VERTEBRAL SEL VERTEBRAL BILAT MOD SED  10/18/2018 . IR ANGIO VERTEBRAL SEL VERTEBRAL UNI L MOD SED  08/18/2019 . IR GASTROSTOMY TUBE MOD SED  10/10/2019 . IR PTA INTRACRANIAL  08/25/2019 . IR US GUIDE VASC ACCESS RIGHT  10/18/2018 . IR US GUIDE VASC ACCESS RIGHT  08/18/2019 . IR US GUIDE VASC ACCESS RIGHT  08/25/2019 . MYOMECTOMY   . RADIOLOGY WITH ANESTHESIA N/A 08/18/2019  Procedure: STENTING;  Surgeon: Luanne Bras, MD;  Location: [place];  Service: Radiology;  Laterality: N/A; . RADIOLOGY WITH ANESTHESIA N/A 08/25/2019  Procedure: STENTING;  Surgeon: Luanne Bras,  MD;  Location: [place];  Service: Radiology;  Laterality: N/A; HPI: 69 y.o. female with PMH significant for HTN, HLD, CVA 06/2018, DM, anemia, obesity, OSA, and OA. Pt underwent R ICA angioplasty on 6/14. Pt with decline in neuro status and hemoglobin on 6/15, found to have a large R groin hematoma.  Pt has had Hbg 5.0 or less and is a Jehovah's witness. MRI showed scattered small acute infarcts along the right MCA/watershed territory. Solitary small acute infarct in the left parietal cortex and right cerebellum. She was intubated on 6/19. Pt underwent tracheostomy on 7/3. PT found to be septic with RLL PNA on 7/6.  MBS 7/22 - started on dys1/nectars.  Patient now with a Shiley #6 cuffless trach in place.  She has planned PEG placement 7/30.   Subjective: pt alert, upright in MBS chair, agreeable to evaluation Assessment / Plan / Recommendation CHL IP CLINICAL IMPRESSIONS 10/15/2019 Clinical Impression Pt demonstrated min-mild oropharyngeal dysphagia and improved from prior MBS. Orally she exhibited dfficulty maintaining bolus resulting in sublinual aggregation and spill  to vallculae. Puree bolus reached his pyriform sinuses and remained for 5 seconds before swallow onset. Trace and mostly flash penetration with thin and nectar via straw. Smaller sips were effective however given oral impairments, cognitive deficits and respiratory compromise, recommend initiating nectar thick liquids. Mildly prolonged mastication with cracker. Recommend Dys 2, nectar thick liquids, pills whole in puree and full supervision/assist.      SLP Visit Diagnosis Dysphagia, oropharyngeal phase (R13.12) Attention and concentration deficit following -- Frontal lobe and executive function deficit following -- Impact on safety and function Mild aspiration risk;Moderate aspiration risk   CHL IP TREATMENT RECOMMENDATION 10/15/2019 Treatment Recommendations Therapy as outlined in treatment plan below   Prognosis 10/15/2019 Prognosis for Safe Diet  Advancement Good Barriers to Reach Goals Cognitive deficits Barriers/Prognosis Comment -- CHL IP DIET RECOMMENDATION 10/15/2019 SLP Diet Recommendations Nectar thick liquid;Dysphagia 2 (Fine chop) solids Liquid Administration via Cup;Straw Medication Administration Crushed with puree Compensations Slow rate;Small sips/bites;Other (Comment);Clear throat intermittently Postural Changes Remain semi-upright after after feeds/meals (Comment)   CHL IP OTHER RECOMMENDATIONS 10/15/2019 Recommended Consults -- Oral Care Recommendations Oral care BID Other Recommendations --   CHL IP FOLLOW UP RECOMMENDATIONS 10/15/2019 Follow up Recommendations Skilled Nursing facility   [facility] IP FREQUENCY AND DURATION 10/15/2019 Speech Therapy Frequency (ACUTE ONLY) min 2x/week Treatment Duration 2 weeks      CHL IP ORAL PHASE 10/15/2019 Oral Phase Impaired Oral - Pudding Teaspoon -- Oral - Pudding Cup -- Oral - Honey Teaspoon -- Oral - Honey Cup -- Oral - Nectar Teaspoon -- Oral - Nectar Cup Decreased bolus cohesion;Other (Comment) Oral - Nectar Straw Decreased bolus cohesion Oral - Thin Teaspoon -- Oral - Thin Cup Other (Comment);Decreased bolus cohesion Oral - Thin Straw Decreased bolus cohesion Oral - Puree Delayed oral transit Oral - Mech Soft -- Oral - Regular (No Data) Oral - Multi-Consistency -- Oral - Pill -- Oral Phase - Comment --  CHL IP PHARYNGEAL PHASE 10/15/2019 Pharyngeal Phase Impaired Pharyngeal- Pudding Teaspoon -- Pharyngeal -- Pharyngeal- Pudding Cup -- Pharyngeal -- Pharyngeal- Honey Teaspoon -- Pharyngeal -- Pharyngeal- Honey Cup -- Pharyngeal -- Pharyngeal- Nectar Teaspoon NT Pharyngeal -- Pharyngeal- Nectar Cup WFL Pharyngeal -- Pharyngeal- Nectar Straw Penetration/Aspiration during swallow Pharyngeal Material enters airway, remains ABOVE vocal cords then ejected out Pharyngeal- Thin Teaspoon NT Pharyngeal -- Pharyngeal- Thin Cup WFL Pharyngeal -- Pharyngeal- Thin Straw Penetration/Aspiration during swallow Pharyngeal Material  enters airway, remains ABOVE vocal cords then ejected out;Material enters airway, remains ABOVE vocal cords and not ejected out Pharyngeal- Puree Delayed swallow initiation-vallecula Pharyngeal -- Pharyngeal- Mechanical Soft -- Pharyngeal -- Pharyngeal- Regular Pharyngeal residue - valleculae Pharyngeal -- Pharyngeal- Multi-consistency -- Pharyngeal -- Pharyngeal- Pill -- Pharyngeal -- Pharyngeal Comment --  CHL IP CERVICAL ESOPHAGEAL PHASE 10/15/2019 Cervical Esophageal Phase WFL Pudding Teaspoon -- Pudding Cup -- Honey Teaspoon -- Honey Cup -- Nectar Teaspoon -- Nectar Cup -- Nectar Straw -- Thin Teaspoon -- Thin Cup -- Thin Straw -- Puree -- Mechanical Soft -- Regular -- Multi-consistency -- Pill -- Cervical Esophageal Comment -- Donna [name] 10/15/2019, 1:24 PM  Donna [name] M.Ed CCC-SLP Speech-Language Pathologist Pager [phone] Office [phone]              Labs:  CBC: Recent Labs    10/09/19 0731 10/10/19 0507 10/13/19 0614 10/16/19 0400  WBC 4.8 5.2 4.2 5.5  HGB 7.6* 8.4* 8.5* 8.6*  HCT 27.4* 29.0* 29.5* 29.7*  PLT 274 307 310 324    COAGS: Recent Labs  11/27/18 0728 08/18/19 0705 08/26/19 1715 08/27/19 0230  INR 0.9 0.9 1.1 1.2    BMP: Recent Labs    10/06/19 1155 10/10/19 0507 10/13/19 0614 10/16/19 0400  NA 143 141 138 138  K 3.8 3.3* 3.5 4.2  CL 103 104 103 99  CO2 29 29 32 30  GLUCOSE 223* 145* 153* 224*  BUN 16 <5* 8 13  CALCIUM 9.2 9.1 8.6* 9.1  CREATININE 0.71 0.55 0.48 0.68  GFRNONAA >60 >60 >60 >60  GFRAA >60 >60 >60 >60    LIVER FUNCTION TESTS: Recent Labs    09/17/19 0703 09/17/19 0703 09/20/19 0500 09/21/19 0500 09/26/19 0537 10/06/19 1155  BILITOT 1.1  --  0.7 0.3 0.5  --   AST 32  --  27 31 31   --   ALT 23  --  29 36 42  --   ALKPHOS 82  --  95 81 57  --   PROT 6.6  --  7.4 7.1 5.5*  --   ALBUMIN 1.6*   < > 1.5* 1.6* 1.8* 2.5*   < > = values in this interval not displayed.    Assessment and Plan:  Right ICA  cervical/petrous junction stenosis s/p revascularization using balloon angioplasty via right radial and right femoral approach6/14/2021by Dr. Estanislado Pandy. Mild-moderate right thigh (with some retroperitoneal component) hematoma. Acute blood loss anemia, Jehovah's Witness. Patient's conditionslightly improved-tracheostomy withoutsedation, more alert today, speech clear with passy muir in place, follows simple commands, moving all extremities with weakness of left side. Right groinand right radial puncture sitesstable, no signs of active bleeding. Hgbgrossly stable at8.6 yesterday-receiving supplementation per severe anemia protocol. For possible transfer toSNF (insurance denied LTAC),awaitingfamily decision regarding SNF placement. Continue to hold Brilinta/Aspirin/Lovenox.Agree with TRH plans toavoid excess blood drawl. Further plans perTRH- appreciate and agree with management. NIR to follow.   Electronically Signed: Earley Abide, PA-C 10/17/2019, 10:08 AM   I spent a total of 15 Minutes at the the patient's bedside AND on the patient's hospital floor or unit, greater than 50% of which was counseling/coordinating care for right ICA stenosis s/p revascularization.

## 2019-10-17 NOTE — Progress Notes (Signed)
Physical Therapy Treatment Patient Details Name: Donna [name] MRN: [id number] DOB: 01-13-1951 Today's Date: 10/17/2019    History of Present Illness 69 y.o. female with PMH significant for HTN, HLD, CVA 06/2018, DM, anemia, obesity, OSA, and OA. She is known to [facility] and has been followed by Dr. Estanislado Pandy since 10/2018. Pt underwent R ICA angioplasty on 6/14. Pt with decline in neuro status and hemoglobin on 6/15, found to have a large R groin hematoma.  Pt has had Hbg 5.0 or less and is a Jehovah's witness.  She was intubated on 6/19. Pt underwent tracheostomy on 7/3. PT found to be septic with RLL PNA on 7/6. 7/13 trach tolerating extubation. Patient had a significant event on 7/25 resulting in respiratory distress, oxygen desaturation, and retroperitoneal hematomas in the right iliac/inguinal regions.    PT Comments    Patient received in bed, pleasant and cooperative today. Focused session on functional exercises and neuromuscular based activities from bed level today, including cross midline reaching from chair position in bed with Max facilitation for control/reaching with L UE; also worked on forward reaching to target for improved abdominal activation from chair position in hospital bed. Also continued working on activation of L LE musculature including SAQs and quad sets with max verbal and tactile cues and performed heel cord stretches. Seems to have some significant apraxia as had quite a bit of difficulty in activating L quad when specifically directed to, however spontaneously performed SAQ with LLE independently when asked to roll in bed!  Also worked on L shoulder flexion to shoulder level, continues to have quite a bit of difficulty when attempting to use L UE at this time. Continues to demonstrate ongoing neglect to L side as well. Continues to need max cues and facilitation for rolling side to side. Very fatigued at EOS. Positioned to comfort with all needs met, PMV removed, and bed alarm  active. Will continue to follow acutely.    Follow Up Recommendations  SNF     Equipment Recommendations  Wheelchair (measurements PT);Wheelchair cushion (measurements PT);Hospital bed    Recommendations for Other Services       Precautions / Restrictions Precautions Precautions: Fall Precaution Comments: trach, PEG, dense L hemiplegia, can now wear PMV with full supervision by staff Required Braces or Orthoses: Other Brace Other Brace: L resting hand splint, prevalon boots Restrictions Weight Bearing Restrictions: No    Mobility  Bed Mobility Overal bed mobility: Needs Assistance Bed Mobility: Rolling Rolling: +2 for physical assistance;Max assist         General bed mobility comments: MaxAx2 for rolling side to side, max cues for sequencing and placement of UE/LE  Transfers                 General transfer comment: deferred today  Ambulation/Gait             General Gait Details: deferred today   Stairs             Wheelchair Mobility    Modified Rankin (Stroke Patients Only) Modified Rankin (Stroke Patients Only) Pre-Morbid Rankin Score: No symptoms Modified Rankin: Severe disability     Balance Overall balance assessment: Needs assistance Sitting-balance support: Single extremity supported;Feet supported Sitting balance-Leahy Scale: Poor   Postural control: Posterior lean;Left lateral lean                                  Cognition Arousal/Alertness: Awake/alert  Behavior During Therapy: WFL for tasks assessed/performed Overall Cognitive Status: Impaired/Different from baseline Area of Impairment: Attention;Problem solving;Safety/judgement;Awareness                   Current Attention Level: Sustained   Following Commands: Follows one step commands with increased time Safety/Judgement: Decreased awareness of safety;Decreased awareness of deficits Awareness: Intellectual Problem Solving: Slow  processing;Requires verbal cues;Decreased initiation;Difficulty sequencing;Requires tactile cues General Comments: inconsistently speaking to therapist even with PMV applied, but when she did speak was able to get longer statements out- said "macaroni and cheese!" when asked what food she has been eating      Exercises      General Comments        Pertinent Vitals/Pain Pain Assessment: Faces Pain Score: 0-No pain Faces Pain Scale: No hurt Pain Intervention(s): Limited activity within patient's tolerance;Monitored during session    Home Living                      Prior Function            PT Goals (current goals can now be found in the care plan section) Acute Rehab PT Goals Patient Stated Goal: none stated PT Goal Formulation: Patient unable to participate in goal setting Time For Goal Achievement: 10/23/19 Potential to Achieve Goals: Fair Progress towards PT goals: Progressing toward goals    Frequency    Min 3X/week      PT Plan Current plan remains appropriate    Co-evaluation              AM-PAC PT "6 Clicks" Mobility   Outcome Measure  Help needed turning from your back to your side while in a flat bed without using bedrails?: Total Help needed moving from lying on your back to sitting on the side of a flat bed without using bedrails?: Total Help needed moving to and from a bed to a chair (including a wheelchair)?: Total Help needed standing up from a chair using your arms (e.g., wheelchair or bedside chair)?: Total Help needed to walk in hospital room?: Total Help needed climbing 3-5 steps with a railing? : Total 6 Click Score: 6    End of Session Equipment Utilized During Treatment: Oxygen (trach) Activity Tolerance: Patient tolerated treatment well Patient left: in bed;with call bell/phone within reach;with bed alarm set Nurse Communication: Mobility status PT Visit Diagnosis: Other abnormalities of gait and mobility (R26.89);Muscle  weakness (generalized) (M62.81);Other symptoms and signs involving the nervous system (R29.898);Hemiplegia and hemiparesis Hemiplegia - Right/Left: Left Hemiplegia - dominant/non-dominant: Non-dominant Hemiplegia - caused by: Cerebral infarction     Time: 8719-5974 PT Time Calculation (min) (ACUTE ONLY): 27 min  Charges:  $Therapeutic Exercise: 8-22 mins $Neuromuscular Re-education: 8-22 mins                     Windell Norfolk, DPT, PN1   Supplemental Physical Therapist [place]    Pager [phone] Acute Rehab Office [phone]

## 2019-10-17 NOTE — Progress Notes (Signed)
  Speech Language Pathology Treatment: Dysphagia  Patient Details Name: Donna [name] MRN: [id number] DOB: 1950-08-27 Today's Date: 10/17/2019 Time: 2426-8341 SLP Time Calculation (min) (ACUTE ONLY): 37 min  Assessment / Plan / Recommendation Clinical Impression  Today pt seen to assess po tolerance, educate her to swallow and PMSV precautions.  Pt with delayed responses to SLP - likely due to receptive language difficulty.  She was willing to consume ice chips, juice, pudding and graham cracker after self conducted oral care.  Pt took entire 1/2 of cracker and placed it in her mouth - SLP removed 1/2 explaining importance of aspiration precautions including small boluses.  Prolonged oral mastication suspected but no retention. SLP instructed pt to importance of oral care and assisted pt with oral suction with toothette.  Advised RN to have pt brush her teeth with toothbrush given her adequate oral control.  Pt noted to continue with delayed audible swallow, occasional second swallow and reflexive delayed cough with increased wheeze with valve in place.  Pt reported sensation of residuals in pharynx with direct ? Cue - providing her with ice and cues to dry swallow reportedly helpful to decrease retention.  ? If pt would benefit from decreasing amount of tube feeding to improve oral intake?  Using head nodding, SLP indicated yes to choice of two answers for aspiration precautions.  She has delayed verbal responses when she does respond, inconsisent verbal responses noted.  No family present at this time.  Pt will continue to benefit from full supervision due to requiring moderate verba/tactile cues to take small boluses.  Will follow up for dysphagia/PMSV management.  Updated precaution signs.  Thanks.    HPI HPI: 69 y.o. female with PMH significant for HTN, HLD, CVA 06/2018, DM, anemia, obesity, OSA, and OA. Pt underwent R ICA angioplasty on 6/14. Pt with decline in neuro status and hemoglobin on 6/15,  found to have a large R groin hematoma.  Pt has had Hbg 5.0 or less and is a Jehovah's witness. MRI showed scattered small acute infarcts along the right MCA/watershed territory. Solitary small acute infarct in the left parietal cortex and right cerebellum. She was intubated on 6/19. Pt underwent tracheostomy on 7/3. PT found to be septic with RLL PNA on 7/6.  MBS 7/22 - started on dys1/nectars.  Patient now with a Shiley #6 cuffless trach in place.  She has planned PEG placement 7/30.        SLP Plan  Continue with current plan of care       Recommendations  Diet recommendations: Dysphagia 2 (fine chop);Nectar-thick liquid (ice chips) Liquids provided via: Cup;Straw Medication Administration: Via alternative means Supervision: Staff to assist with self feeding Compensations: Slow rate;Small sips/bites;Other (Comment);Clear throat intermittently (pmsv with meals) Postural Changes and/or Swallow Maneuvers: Seated upright 90 degrees;Upright 30-60 min after meal      Patient may use Passy-Muir Speech Valve: During all therapies with supervision PMSV Supervision: Full MD: Please consider changing trach tube to : Smaller size;Cuffless         Oral Care Recommendations: Oral care QID Follow up Recommendations: Skilled Nursing facility;24 hour supervision/assistance SLP Visit Diagnosis: Aphonia (R49.1);Dysphagia, unspecified (R13.10) Plan: Continue with current plan of care       GO                [name] 10/17/2019, 9:24 AM  Rolena Infante, MS [facility] SLP Acute Rehab Services Office [phone]

## 2019-10-17 NOTE — Progress Notes (Signed)
PROGRESS NOTE    KONNI KESINGER  TDD:[id number] DOB: [birth year] DOA: 08/25/2019 PCP: Minette Brine, FNP      Brief Narrative:  [name] is a 69 y.o. F with hx HTN, CVA, residual left hemiparesis, DM, anemia, obesity, and cerebrovascular disease admitted mid-June for right ICA petrous/cervical junction angioplasty and stent due to symptomatic severe intracranial stenosis but post-operatively, developed confusion, worsening left hemiparesis night after procedure.  Medicine and Neurology were consulted, patient noted to have had hypotension and new anemia, and subsequent MRI showed evidence of watershed infarcts.  CT abdomen showed small RP hematoma.  She was transferred to the ICU on neosynephrine.  Patient stabilized until around 6/19 when she was intubated due to pulmonary edema and severe encephalopathy.  Failed extubation 6/28, tracheostomy 7/3.  Subsequently stabilized, denied for LTAC.  7/25, patient again developed respiratory distress, CTA chest showed GGO in upper lobes, patient treated for aspiration pneumonia.  7/30 PEG placed.  See summary note from 8/3.        Assessment & Plan:  Watershed infarct stroke, right MCA territory -Continue atorvastatin -Continue to hold aspirin, Brilinta, Lovenox   Acute metabolic encephalopathy -Continue famotidine -Continue tube feeds -Continue Seroquel  Chronic respiratory failure -Pulmonary trach team following -Continue Pulmicort, duo nebs  Anemia Previously received folate, B12, and Epogen Hemoglobin stable low -Continue iron  -Hold aspirin, Brilinta -Avoid excess blood draws  Diabetes Glucoses controlled -Continue Lantus -Continue SS corrections  Hypertension Blood pressure slightly elevated -Continue amlodipine, hydralazine            Disposition: Status is: Inpatient  Remains inpatient appropriate because:Unsafe d/c plan   Dispo: The patient is from: Home              Anticipated d/c is to:  TBD              Anticipated d/c date is: > 3 days              Patient currently is medically stable to d/c. to a lower level of care if appropriate resources can be put in place              MDM: The below labs and imaging reports were reviewed and summarized above.  Medication management as above.   DVT prophylaxis: Place and maintain sequential compression device Start: 09/26/19 1319  Code Status: FULL Family Communication: Sister at bedside              Subjective: No fever, no increased respiratory effort, no obvious pain complaints, no vomiting or diarrhea.  Objective: Vitals:   10/17/19 1205 10/17/19 1547 10/17/19 1549 10/17/19 1551  BP: (!) 148/55   (!) 161/66  Pulse: 97  93 94  Resp:   18   Temp: 98.7 F (37.1 C)   98.7 F (37.1 C)  TempSrc: Oral   Oral  SpO2:  99%    Weight:      Height:        Intake/Output Summary (Last 24 hours) at 10/17/2019 1847 Last data filed at 10/17/2019 1500 Gross per 24 hour  Intake 1700 ml  Output 1500 ml  Net 200 ml   Filed Weights   10/15/19 0316 10/16/19 0630 10/17/19 0300  Weight: 78.3 kg 74.7 kg 74.2 kg    Examination: General appearance:  adult female, alert and in no obvious distress.  Tracheostomy, lying in bed, makes eye contact after I prompt her, then stares off, intermittently shakes head to questions, but not reliably  HEENT: Anicteric, conjunctiva pink, lids and lashes normal. No nasal deformity, discharge, epistaxis.  Tracheostomy in place with oxygen blow-by, no secretions from trach Skin: Warm and dry.  No suspicious rashes or lesions. Cardiac: RRR, nl S1-S2, no murmurs appreciated.  Capillary refill is brisk.  JVP not visible.  No LE edema.  Radial pulses 2+ and symmetric. Respiratory: Normal respiratory rate and rhythm.  CTAB without rales or wheezes. Abdomen: Abdomen soft.  No TTP or guarding. No ascites, distension, hepatosplenomegaly.   MSK: No deformities or effusions. Neuro: Awake,  intermittently makes eye contact, 2/5 strength in bilateral upper extremities, legs flaccid, no verbalizations Psych: Unable to assess    Data Reviewed: I have personally reviewed following labs and imaging studies:  CBC: Recent Labs  Lab 10/13/19 0614 10/16/19 0400  WBC 4.2 5.5  NEUTROABS 2.4  --   HGB 8.5* 8.6*  HCT 29.5* 29.7*  MCV 99.7 100.3*  PLT 310 161   Basic Metabolic Panel: Recent Labs  Lab 10/13/19 0614 10/16/19 0400  NA 138 138  K 3.5 4.2  CL 103 99  CO2 32 30  GLUCOSE 153* 224*  BUN 8 13  CREATININE 0.48 0.68  CALCIUM 8.6* 9.1  MG 1.9  --   PHOS 3.6  --    GFR: Estimated Creatinine Clearance: 62.1 mL/min (by C-G formula based on SCr of 0.68 mg/dL). Liver Function Tests: No results for input(s): AST, ALT, ALKPHOS, BILITOT, PROT, ALBUMIN in the last 168 hours. No results for input(s): LIPASE, AMYLASE in the last 168 hours. No results for input(s): AMMONIA in the last 168 hours. Coagulation Profile: No results for input(s): INR, PROTIME in the last 168 hours. Cardiac Enzymes: No results for input(s): CKTOTAL, CKMB, CKMBINDEX, TROPONINI in the last 168 hours. BNP (last 3 results) No results for input(s): PROBNP in the last 8760 hours. HbA1C: No results for input(s): HGBA1C in the last 72 hours. CBG: Recent Labs  Lab 10/16/19 2331 10/17/19 0305 10/17/19 0732 10/17/19 1203 10/17/19 1555  GLUCAP 195* 163* 146* 168* 145*   Lipid Profile: No results for input(s): CHOL, HDL, LDLCALC, TRIG, CHOLHDL, LDLDIRECT in the last 72 hours. Thyroid Function Tests: No results for input(s): TSH, T4TOTAL, FREET4, T3FREE, THYROIDAB in the last 72 hours. Anemia Panel: No results for input(s): VITAMINB12, FOLATE, FERRITIN, TIBC, IRON, RETICCTPCT in the last 72 hours. Urine analysis:    Component Value Date/Time   COLORURINE AMBER (A) 09/16/2019 0931   APPEARANCEUR HAZY (A) 09/16/2019 0931   LABSPEC 1.021 09/16/2019 0931   PHURINE 5.0 09/16/2019 0931    GLUCOSEU NEGATIVE 09/16/2019 0931   HGBUR NEGATIVE 09/16/2019 0931   BILIRUBINUR NEGATIVE 09/16/2019 0931   BILIRUBINUR negative 01/02/2018 1636   KETONESUR NEGATIVE 09/16/2019 0931   PROTEINUR 30 (A) 09/16/2019 0931   UROBILINOGEN 0.2 01/02/2018 1636   NITRITE NEGATIVE 09/16/2019 0931   LEUKOCYTESUR SMALL (A) 09/16/2019 0931   Sepsis Labs: '@LABRCNTIP'$ (procalcitonin:4,lacticacidven:4)  ) Recent Results (from the past 240 hour(s))  Culture, respiratory (non-expectorated)     Status: None   Collection Time: 10/08/19  2:15 AM   Specimen: Tracheal Aspirate; Respiratory  Result Value Ref Range Status   Specimen Description TRACHEAL ASPIRATE  Final   Special Requests NONE  Final   Gram Stain   Final    MODERATE WBC PRESENT, PREDOMINANTLY PMN FEW GRAM NEGATIVE COCCOBACILLI RARE GRAM POSITIVE COCCI IN PAIRS Performed at [facility], [place] [address] [place], [place] [address]    Culture RARE [facility] MORGANII  Final  Report Status 10/10/2019 FINAL  Final   Organism ID, Bacteria MORGANELLA MORGANII  Final      Susceptibility   Morganella morganii - MIC*    AMPICILLIN >=32 RESISTANT Resistant     CEFAZOLIN >=64 RESISTANT Resistant     CEFTAZIDIME <=1 SENSITIVE Sensitive     CIPROFLOXACIN <=0.25 SENSITIVE Sensitive     GENTAMICIN <=1 SENSITIVE Sensitive     IMIPENEM 2 SENSITIVE Sensitive     TRIMETH/SULFA <=20 SENSITIVE Sensitive     AMPICILLIN/SULBACTAM 16 INTERMEDIATE Intermediate     PIP/TAZO <=4 SENSITIVE Sensitive     * RARE [facility] MORGANII         Radiology Studies: No results found.      Scheduled Meds: . amLODipine  10 mg Per Tube Daily  . atorvastatin  40 mg Per NG tube Daily  . budesonide (PULMICORT) nebulizer solution  0.5 mg Nebulization BID  . chlorhexidine gluconate (MEDLINE KIT)  15 mL Mouth Rinse BID  . Chlorhexidine Gluconate Cloth  6 each Topical Daily  . famotidine  20 mg Per Tube BID  . feeding supplement (PROSource TF)  45 mL Per  Tube TID  . ferrous sulfate  220 mg Per Tube TID WC  . hydrALAZINE  100 mg Per Tube Q6H  . hydrocerin   Topical TID  . insulin aspart  0-15 Units Subcutaneous Q4H  . insulin aspart  4 Units Subcutaneous Q4H  . insulin glargine  15 Units Subcutaneous Daily  . ipratropium-albuterol  3 mL Nebulization TID  . mouth rinse  15 mL Mouth Rinse 10 times per day  . QUEtiapine  100 mg Per Tube QHS  . sodium chloride flush  10-40 mL Intracatheter Q12H   Continuous Infusions: . sodium chloride Stopped (10/09/19 0900)  . feeding supplement (JEVITY 1.2 CAL) 1,000 mL (10/17/19 0015)     LOS: 53 days    Time spent: 25 minutes    Edwin Dada, MD [facility] 10/17/2019, 6:47 PM     Please page though [place] or Epic secure chat:  For [employer], Adult nurse

## 2019-10-18 DIAGNOSIS — I6521 Occlusion and stenosis of right carotid artery: Secondary | ICD-10-CM | POA: Diagnosis not present

## 2019-10-18 LAB — GLUCOSE, CAPILLARY
Glucose-Capillary: 187 mg/dL — ABNORMAL HIGH (ref 70–99)
Glucose-Capillary: 163 mg/dL — ABNORMAL HIGH (ref 70–99)
Glucose-Capillary: 137 mg/dL — ABNORMAL HIGH (ref 70–99)
Glucose-Capillary: 141 mg/dL — ABNORMAL HIGH (ref 70–99)
Glucose-Capillary: 138 mg/dL — ABNORMAL HIGH (ref 70–99)
Glucose-Capillary: 177 mg/dL — ABNORMAL HIGH (ref 70–99)

## 2019-10-18 MED ORDER — ACETYLCYSTEINE 20 % IN SOLN
2.0000 mL | Freq: Three times a day (TID) | RESPIRATORY_TRACT | Status: DC
  Administered 2019-10-19 – 2019-10-24 (×14): 2 mL via RESPIRATORY_TRACT
  Filled 2019-10-18 (×2): qty 2
  Filled 2019-10-18 (×2): qty 4
  Filled 2019-10-18: qty 2
  Filled 2019-10-18 (×14): qty 4
  Filled 2019-10-18: qty 2

## 2019-10-18 NOTE — Progress Notes (Signed)
PROGRESS NOTE    Donna [name]  IRS:[id number] DOB: 06/28/1950 DOA: 08/25/2019 PCP: Minette Brine, FNP      Brief Narrative:  Donna [name] is a 69 y.o. F with hx HTN, CVA, residual left hemiparesis, DM, anemia, obesity, and cerebrovascular disease admitted mid-June for right ICA petrous/cervical junction angioplasty and stent due to symptomatic severe intracranial stenosis but post-operatively, developed confusion, worsening left hemiparesis night after procedure.  Medicine and Neurology were consulted, patient noted to have had hypotension and new anemia, and subsequent MRI showed evidence of watershed infarcts.  CT abdomen showed small RP hematoma.  She was transferred to the ICU on neosynephrine.  Patient stabilized until around 6/19 when she was intubated due to pulmonary edema and severe encephalopathy.  Failed extubation 6/28, tracheostomy 7/3.  Subsequently stabilized, denied for LTAC.  7/25, patient again developed respiratory distress, CTA chest showed GGO in upper lobes, patient treated for aspiration pneumonia.  7/30 PEG placed.  See summary note from 8/3.        Assessment & Plan:  Watershed infarct stroke, right MCA territory -Continue atorvastatin -Continue to hold aspirin, Brilinta, Lovenox   Acute metabolic encephalopathy Continue famotidine -Continue tube feeds -Continue Seroquel  Chronic respiratory failure -Pulmonary trach team following -Continue Pulmicort, duo nebs  Anemia Previously received folate, B12, and Epogen Hemoglobin stable low -Continue iron -Hold aspirin, Brilinta -Avoid excess blood draws  Diabetes Glucoses normal -Continue Lantus -Continue SS corrections  Hypertension Blood pressure slightly above goal -Continue amlodipine, hydralazine            Disposition: Status is: Inpatient  Remains inpatient appropriate because:Unsafe d/c plan   Dispo: The patient is from: Home              Anticipated d/c is to:  TBD              Anticipated d/c date is: > 3 days              Patient currently is medically stable to d/c. to a lower level of care if appropriate resources can be put in place              MDM: The below labs and imaging reports were reviewed and summarized above.  Medication management as above.   DVT prophylaxis: Place and maintain sequential compression device Start: 09/26/19 1319  Code Status: FULL Family Communication: None present today            Subjective: No new fever, increased respiratory effort, pain, vomiting, or diarrhea.  Objective: Vitals:   10/18/19 1135 10/18/19 1208 10/18/19 1358 10/18/19 1500  BP:  (!) 152/62    Pulse: 92 97  99  Resp: 18 19  18   Temp:  98.9 F (37.2 C)    TempSrc:  Oral    SpO2: 99%  99% 99%  Weight:      Height:        Intake/Output Summary (Last 24 hours) at 10/18/2019 1603 Last data filed at 10/18/2019 0656 Gross per 24 hour  Intake 946.67 ml  Output 1400 ml  Net -453.33 ml   Filed Weights   10/16/19 0630 10/17/19 0300 10/18/19 0351  Weight: 74.7 kg 74.2 kg 74.1 kg    Examination: General appearance: Adult female, trach and PEG, lying in bed, sleeping, wakes up and makes eye contact     HEENT: Tracheostomy in place, appears normal Skin: Warm and dry, no suspicious lesions or lesions on the back Cardiac: RRR, no murmurs, no  lower extremity edema Respiratory: Normal respiratory rate and rhythm, lungs clear without rales or wheezes Abdomen: Abdomen soft without tenderness palpation or guarding, no ascites or distention MSK:  Neuro: Awake and makes eye contact, upper extremities extremely weak, but symmetric, legs flaccid, no verbalizations Psych:      Data Reviewed: I have personally reviewed following labs and imaging studies:  CBC: Recent Labs  Lab 10/13/19 0614 10/16/19 0400  WBC 4.2 5.5  NEUTROABS 2.4  --   HGB 8.5* 8.6*  HCT 29.5* 29.7*  MCV 99.7 100.3*  PLT 310 568   Basic Metabolic  Panel: Recent Labs  Lab 10/13/19 0614 10/16/19 0400  NA 138 138  K 3.5 4.2  CL 103 99  CO2 32 30  GLUCOSE 153* 224*  BUN 8 13  CREATININE 0.48 0.68  CALCIUM 8.6* 9.1  MG 1.9  --   PHOS 3.6  --    GFR: Estimated Creatinine Clearance: 61.9 mL/min (by C-G formula based on SCr of 0.68 mg/dL). Liver Function Tests: No results for input(s): AST, ALT, ALKPHOS, BILITOT, PROT, ALBUMIN in the last 168 hours. No results for input(s): LIPASE, AMYLASE in the last 168 hours. No results for input(s): AMMONIA in the last 168 hours. Coagulation Profile: No results for input(s): INR, PROTIME in the last 168 hours. Cardiac Enzymes: No results for input(s): CKTOTAL, CKMB, CKMBINDEX, TROPONINI in the last 168 hours. BNP (last 3 results) No results for input(s): PROBNP in the last 8760 hours. HbA1C: No results for input(s): HGBA1C in the last 72 hours. CBG: Recent Labs  Lab 10/17/19 1952 10/17/19 2339 10/18/19 0349 10/18/19 0800 10/18/19 1213  GLUCAP 82 163* 187* 163* 137*   Lipid Profile: No results for input(s): CHOL, HDL, LDLCALC, TRIG, CHOLHDL, LDLDIRECT in the last 72 hours. Thyroid Function Tests: No results for input(s): TSH, T4TOTAL, FREET4, T3FREE, THYROIDAB in the last 72 hours. Anemia Panel: No results for input(s): VITAMINB12, FOLATE, FERRITIN, TIBC, IRON, RETICCTPCT in the last 72 hours. Urine analysis:    Component Value Date/Time   COLORURINE AMBER (A) 09/16/2019 0931   APPEARANCEUR HAZY (A) 09/16/2019 0931   LABSPEC 1.021 09/16/2019 0931   PHURINE 5.0 09/16/2019 0931   GLUCOSEU NEGATIVE 09/16/2019 0931   HGBUR NEGATIVE 09/16/2019 0931   BILIRUBINUR NEGATIVE 09/16/2019 0931   BILIRUBINUR negative 01/02/2018 1636   KETONESUR NEGATIVE 09/16/2019 0931   PROTEINUR 30 (A) 09/16/2019 0931   UROBILINOGEN 0.2 01/02/2018 1636   NITRITE NEGATIVE 09/16/2019 0931   LEUKOCYTESUR SMALL (A) 09/16/2019 0931   Sepsis Labs: '@LABRCNTIP'$ (procalcitonin:4,lacticacidven:4)  ) No  results found for this or any previous visit (from the past 240 hour(s)).       Radiology Studies: No results found.      Scheduled Meds: . acetylcysteine  2 mL Nebulization TID  . amLODipine  10 mg Per Tube Daily  . atorvastatin  40 mg Per NG tube Daily  . budesonide (PULMICORT) nebulizer solution  0.5 mg Nebulization BID  . chlorhexidine gluconate (MEDLINE KIT)  15 mL Mouth Rinse BID  . Chlorhexidine Gluconate Cloth  6 each Topical Daily  . famotidine  20 mg Per Tube BID  . feeding supplement (PROSource TF)  45 mL Per Tube TID  . ferrous sulfate  220 mg Per Tube TID WC  . hydrALAZINE  100 mg Per Tube Q6H  . hydrocerin   Topical TID  . insulin aspart  0-15 Units Subcutaneous Q4H  . insulin aspart  4 Units Subcutaneous Q4H  . insulin glargine  15 Units Subcutaneous Daily  . ipratropium-albuterol  3 mL Nebulization TID  . mouth rinse  15 mL Mouth Rinse 10 times per day  . QUEtiapine  100 mg Per Tube QHS  . sodium chloride flush  10-40 mL Intracatheter Q12H   Continuous Infusions: . sodium chloride Stopped (10/09/19 0900)  . feeding supplement (JEVITY 1.2 CAL) 1,000 mL (10/17/19 2319)     LOS: 54 days    Time spent: 25  minutes    Edwin Dada, MD [facility] 10/18/2019, 4:03 PM     Please page though [place] or Epic secure chat:  For [employer], Adult nurse

## 2019-10-18 NOTE — Plan of Care (Signed)
Discussed with patient plan of care with patient, pain management and importance of eating with some teach back displayed

## 2019-10-19 DIAGNOSIS — I6521 Occlusion and stenosis of right carotid artery: Secondary | ICD-10-CM | POA: Diagnosis not present

## 2019-10-19 LAB — GLUCOSE, CAPILLARY
Glucose-Capillary: 167 mg/dL — ABNORMAL HIGH (ref 70–99)
Glucose-Capillary: 174 mg/dL — ABNORMAL HIGH (ref 70–99)
Glucose-Capillary: 179 mg/dL — ABNORMAL HIGH (ref 70–99)
Glucose-Capillary: 179 mg/dL — ABNORMAL HIGH (ref 70–99)
Glucose-Capillary: 190 mg/dL — ABNORMAL HIGH (ref 70–99)
Glucose-Capillary: 201 mg/dL — ABNORMAL HIGH (ref 70–99)

## 2019-10-19 NOTE — Progress Notes (Signed)
PROGRESS NOTE    Donna [name]  BUL:[id number] DOB: Sep 30, 1950 DOA: 08/25/2019 PCP: Minette Brine, FNP      Brief Narrative:  Donna [name] is a 69 y.o. F with hx HTN, CVA, residual left hemiparesis, DM, anemia, obesity, and cerebrovascular disease admitted mid-June for right ICA petrous/cervical junction angioplasty and stent due to symptomatic severe intracranial stenosis but post-operatively, developed confusion, worsening left hemiparesis night after procedure.  Medicine and Neurology were consulted, patient noted to have had hypotension and new anemia, and subsequent MRI showed evidence of watershed infarcts.  CT abdomen showed small RP hematoma.  She was transferred to the ICU on neosynephrine.  Patient stabilized until around 6/19 when she was intubated due to pulmonary edema and severe encephalopathy.  Failed extubation 6/28, tracheostomy 7/3.  Subsequently stabilized, denied for LTAC.  7/25, patient again developed respiratory distress, CTA chest showed GGO in upper lobes, patient treated for aspiration pneumonia.  7/30 PEG placed.  See summary note from 8/3.        Assessment & Plan:  Watershed infarct stroke, right MCA territory -Continue atorvastatin -Continue to hold aspirin, Brilinta, Lovenox   Acute metabolic encephalopathy Continue famotidine -Continue tube feeds -Continue Seroquel  Chronic respiratory failure -Pulmonary trach team following -Continue Pulmicort, duo nebs  Anemia Previously received folate, B12, and Epogen   -Continue iron -Hold aspirin, Brilinta -Avoid excess blood draws  Diabetes Glucoses within goal range -Continue Lantus -Continue SS corrections  Hypertension Blood pressure above goal -Continue amlodipine, hydralazine           Disposition: Status is: Inpatient  Remains inpatient appropriate because:Unsafe d/c plan   Dispo: The patient is from: Home              Anticipated d/c is to: TBD               Anticipated d/c date is: > 3 days              Patient currently is medically stable to d/c. to a lower level of care if appropriate resources can be put in place              MDM: The below labs and imaging reports were reviewed and summarized above.  Medication management as above.   DVT prophylaxis: Place and maintain sequential compression device Start: 09/26/19 1319  Code Status: FULL Family Communication: None present today            Subjective: No new fever, respiratory effort, pain, vomiting, or diarrhea  Objective: Vitals:   10/19/19 0727 10/19/19 0728 10/19/19 1132 10/19/19 1456  BP:      Pulse:   96   Resp:   20   Temp:      TempSrc:      SpO2: 99% 99% 99% 99%  Weight:      Height:        Intake/Output Summary (Last 24 hours) at 10/19/2019 1643 Last data filed at 10/19/2019 0841 Gross per 24 hour  Intake 1333.34 ml  Output 1000 ml  Net 333.34 ml   Filed Weights   10/17/19 0300 10/18/19 0351 10/19/19 0435  Weight: 74.2 kg 74.1 kg 71.6 kg    Examination: General appearance: Adult female, lying in bed, trach and PEG, interactive, speaks in one-word responses with Passy-Muir valve in place HEENT: Tracheostomy in place, unremarkable, oropharynx moist, no oral lesions, hearing normal, no nasal deformity, discharge, or epistaxis Skin: No suspicious rashes or lesions on the face, neck, arms, back,  or abdomen Cardiac: RRR, no murmurs, no lower extremity edema Respiratory: Normal respiratory rate and rhythm, lungs clear without rales or wheezes Abdomen: Abdomen soft no tenderness palpation or guarding, no ascites or distention MSK:  Neuro: Awake and makes eye contact, upper extremity extremely weak, legs flaccid, laconic, but able to speak with Passy-Muir valve Psych: Attention seems diminished, affect flat  v     Data Reviewed: I have personally reviewed following labs and imaging studies:  CBC: Recent Labs  Lab 10/13/19 0614  10/16/19 0400  WBC 4.2 5.5  NEUTROABS 2.4  --   HGB 8.5* 8.6*  HCT 29.5* 29.7*  MCV 99.7 100.3*  PLT 310 101   Basic Metabolic Panel: Recent Labs  Lab 10/13/19 0614 10/16/19 0400  NA 138 138  K 3.5 4.2  CL 103 99  CO2 32 30  GLUCOSE 153* 224*  BUN 8 13  CREATININE 0.48 0.68  CALCIUM 8.6* 9.1  MG 1.9  --   PHOS 3.6  --    GFR: Estimated Creatinine Clearance: 60.9 mL/min (by C-G formula based on SCr of 0.68 mg/dL). Liver Function Tests: No results for input(s): AST, ALT, ALKPHOS, BILITOT, PROT, ALBUMIN in the last 168 hours. No results for input(s): LIPASE, AMYLASE in the last 168 hours. No results for input(s): AMMONIA in the last 168 hours. Coagulation Profile: No results for input(s): INR, PROTIME in the last 168 hours. Cardiac Enzymes: No results for input(s): CKTOTAL, CKMB, CKMBINDEX, TROPONINI in the last 168 hours. BNP (last 3 results) No results for input(s): PROBNP in the last 8760 hours. HbA1C: No results for input(s): HGBA1C in the last 72 hours. CBG: Recent Labs  Lab 10/18/19 2003 10/18/19 2246 10/19/19 0358 10/19/19 0832 10/19/19 1159  GLUCAP 138* 177* 167* 174* 190*   Lipid Profile: No results for input(s): CHOL, HDL, LDLCALC, TRIG, CHOLHDL, LDLDIRECT in the last 72 hours. Thyroid Function Tests: No results for input(s): TSH, T4TOTAL, FREET4, T3FREE, THYROIDAB in the last 72 hours. Anemia Panel: No results for input(s): VITAMINB12, FOLATE, FERRITIN, TIBC, IRON, RETICCTPCT in the last 72 hours. Urine analysis:    Component Value Date/Time   COLORURINE AMBER (A) 09/16/2019 0931   APPEARANCEUR HAZY (A) 09/16/2019 0931   LABSPEC 1.021 09/16/2019 0931   PHURINE 5.0 09/16/2019 0931   GLUCOSEU NEGATIVE 09/16/2019 0931   HGBUR NEGATIVE 09/16/2019 0931   BILIRUBINUR NEGATIVE 09/16/2019 0931   BILIRUBINUR negative 01/02/2018 1636   KETONESUR NEGATIVE 09/16/2019 0931   PROTEINUR 30 (A) 09/16/2019 0931   UROBILINOGEN 0.2 01/02/2018 1636   NITRITE  NEGATIVE 09/16/2019 0931   LEUKOCYTESUR SMALL (A) 09/16/2019 0931   Sepsis Labs: _0 (procalcitonin:4,lacticacidven:4)  ) No results found for this or any previous visit (from the past 240 hour(s)).       Radiology Studies: No results found.      Scheduled Meds: . acetylcysteine  2 mL Nebulization TID  . amLODipine  10 mg Per Tube Daily  . atorvastatin  40 mg Per NG tube Daily  . budesonide (PULMICORT) nebulizer solution  0.5 mg Nebulization BID  . chlorhexidine gluconate (MEDLINE KIT)  15 mL Mouth Rinse BID  . Chlorhexidine Gluconate Cloth  6 each Topical Daily  . famotidine  20 mg Per Tube BID  . feeding supplement (PROSource TF)  45 mL Per Tube TID  . ferrous sulfate  220 mg Per Tube TID WC  . hydrALAZINE  100 mg Per Tube Q6H  . hydrocerin   Topical TID  . insulin aspart  0-15  Units Subcutaneous Q4H  . insulin aspart  4 Units Subcutaneous Q4H  . insulin glargine  15 Units Subcutaneous Daily  . ipratropium-albuterol  3 mL Nebulization TID  . mouth rinse  15 mL Mouth Rinse 10 times per day  . QUEtiapine  100 mg Per Tube QHS  . sodium chloride flush  10-40 mL Intracatheter Q12H   Continuous Infusions: . sodium chloride Stopped (10/09/19 0900)  . feeding supplement (JEVITY 1.2 CAL) 50 mL/hr at 10/19/19 0646     LOS: 55 days    Time spent: 25 minutes    Edwin Dada, MD [facility] 10/19/2019, 4:43 PM     Please page though [place] or Epic secure chat:  For [employer], Adult nurse

## 2019-10-19 NOTE — TOC Progression Note (Addendum)
Transition of Care [facility]) - Progression Note    Patient Details  Name: Donna [name] MRN: [id number] Date of Birth: December 02, 1950  Transition of Care [facility]) CM/SW Contact  Nonda Lou, [place] Phone Number: 10/19/2019, 8:14 AM  Clinical Narrative:    3:00p CSW spoke with Olegario Messier of [employer] regarding SNF request. Olegario Messier agreed to review referral. Patient is managed through [facility] directly, SNF will start authorization if they are able to take patient.  8:10a CSW attempted to make contact with patient's son Evaristo Bury for a bed choice. Call went to a voicemail that has not been set up. Patient's son text CSW back requesting [facility] SNF.    Expected Discharge Plan: Skilled Nursing Facility Barriers to Discharge: Continued Medical Work up  Expected Discharge Plan and Services Expected Discharge Plan: Skilled Nursing Facility                                               Social Determinants of Health (SDOH) Interventions    Readmission Risk Interventions No flowsheet data found.

## 2019-10-19 NOTE — Plan of Care (Addendum)
Discussed earlier in the shift with patient plan of care for the evening, pain management and reason we need to recheck his blood pressures to make sure its accurate with no teach back displayed at this time.

## 2019-10-20 ENCOUNTER — Ambulatory Visit: Payer: Medicare HMO | Admitting: Nurse Practitioner

## 2019-10-20 LAB — GLUCOSE, CAPILLARY
Glucose-Capillary: 152 mg/dL — ABNORMAL HIGH (ref 70–99)
Glucose-Capillary: 179 mg/dL — ABNORMAL HIGH (ref 70–99)
Glucose-Capillary: 238 mg/dL — ABNORMAL HIGH (ref 70–99)
Glucose-Capillary: 237 mg/dL — ABNORMAL HIGH (ref 70–99)
Glucose-Capillary: 96 mg/dL (ref 70–99)
Glucose-Capillary: 139 mg/dL — ABNORMAL HIGH (ref 70–99)

## 2019-10-20 MED ORDER — JEVITY 1.5 CAL/FIBER PO LIQD
1000.0000 mL | ORAL | Status: DC
  Administered 2019-10-20 – 2019-10-31 (×13): 1000 mL
  Filled 2019-10-20 (×14): qty 1000

## 2019-10-20 NOTE — Plan of Care (Signed)
Discussed with patient plan of care with family present, pain management and mouth care with so,me teach back displayed

## 2019-10-20 NOTE — TOC Progression Note (Addendum)
Transition of Care [facility]) - Progression Note    Patient Details  Name: Donna [name] MRN: [id number] Date of Birth: 1950/05/27  Transition of Care [facility]) CM/SW Contact  Lorri Frederick, LCSW Phone Number: 10/20/2019, 9:18 AM  Clinical Narrative:   CSW spoke with Olegario Messier at [facility] cannot take pt as they are over their limit on trach patients.  Spoke with pt son Donna [name] and updated him.  He does not like any of the other options that have offered-will check on any other options still pending.  CSW received call from Minus Breeding, sister, [phone].  CSW updated her on bed offers, she asked about possible referral to [facility], which was made.  CSW spoke with her several more times during the day: she was able to visit [facility] and is going to visit [facility] tomorrow morning.      Expected Discharge Plan: Skilled Nursing Facility Barriers to Discharge: Continued Medical Work up  Expected Discharge Plan and Services Expected Discharge Plan: Skilled Nursing Facility                                               Social Determinants of Health (SDOH) Interventions    Readmission Risk Interventions No flowsheet data found.

## 2019-10-20 NOTE — Plan of Care (Signed)
Discussed with patient plan of care for the evening, pain management and tube feeding with teach back displayed.

## 2019-10-20 NOTE — Progress Notes (Signed)
NAME:  Donna [name], MRN:  [id number], DOB:  16-Feb-1951, LOS: 56 ADMISSION DATE:  08/25/2019, CONSULTATION DATE:  08/27/2019 REFERRING MD:  Dr. 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  Admitted by Neuro IR on 6/14 for cerebral angiogram s/p RT ICA cervical/ petrous junction balloon angioplasty for severe stenosis via right femoral approach.  Was placed on ASA/ brillinta.  Monitored in Neuro ICU.  Noted to have developed hypotension, increased left sided weakness and lethargy on 6/15 am with Hgb drop from 13.3 to 6.9 with new AKI.  Post MRI/ MRA showed scattered small acute infarcts along the right MCA/ watershed territory suspected to be resultant either post procedure vs hypoperfusion.    A CT abd/ pelvis was obtained given concern for retroperitoneal bleed which showed a mild to moderate amount of retroperitoneal hematoma in the right iliac and inguinal regiones with moderate size hematoma noted the the soft tissues anterior to the musculature of the right hip; no definite intrapertioneal hemorrhage. INR, PT, and platelets rechecked and were wnl.  Vascular surgery was consulted with plans to observe given bleeding was mostly into the muscle and tissue.  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  7/04 TRACHEOSTOMY 7/11 Pall care meeting -  full code. Patient feels less congested, ventilatory setting was adjusted to see if she can be liberated from ventilator, avoiding tissue hypoxemia 7/12 Afebrile since 7/8. + 22L since admit. RN feels patient volume overloaded.Does SBT dailly of varying duration few to several hours and then gets fatigued with tachypnea and tachycardia and agitation. Left sided dense hemiplegia continues. Periodically pruposeful on right side PRecedex continues but unsure if patient needs it/ Diarrhea + - on docusate 7/13 - On PSV via trach. Still unable to come off precedex gtt. ? Diarrhea btter after stopping docusate. No labs as part of blood conservation. Got lasix yesterday. Last dose zosyn today. Diarrhea + but improved after dc laxative. Needed prn for high BP last night. Trach sutures removed 8/09 Waiting on SNF placement    Consults:  NIR - primary Neurology (stroke has signed off 6/24) Vascular surgery  PCCM Palliative Care Hematology  Procedures:   ETT 6/19 > 6/23,  6/23 > Trach 7/4  Significant Diagnostic Tests:  6/14 cerebral angiogram >> s/p RT ICA cer/petrous junction balloon angioplasty for severe stenosis.   6/15 MRA/ MRI brain  > Scattered small acute infarcts along the right MCA/watershed territory. Solitary small acute infarct in the left parietal cortex and right cerebellum. Improved right ICA patency at the petrous segment. Known severe right M1 segment stenosis. 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  6/15 CT a/p wo contrast > Mild to moderate amount of retroperitoneal hematoma is noted in the right iliac and inguinal regions. Moderate size hematoma is noted in the soft tissues anterior to the musculature of the right hip. No definite intraperitoneal hemorrhage is noted. Moderate size fat containing periumbilical hernia.  Aortic Atherosclerosis.   Echo 6/16 > normal LVEF  6/29 MRI brain > Progressed size and confluence of ischemia throughout much of the right hemisphere  white matter since 08/26/2019. Associated petechial blood products but no malignant hemorrhagic transformation or mass Effect. There are also multiple new lacunar type infarcts elsewhere, including the brainstem and the left basal ganglia. Underlying chronic infarcts of the right basal ganglia and left Cerebellum.   Micro Data:  6/10 SARS 2 >> neg 6/14 MRSA PCR >> neg 6/20 trach asp >> few diptheroids/ corynebacterium species 6/18 BC x 2 >> neg 7/28 Trach asp >> morganella morganii >> S-ceftazidime   Antimicrobials:  6/14 cefazolin pre-op  6/20 unasyn >> 09/02/2019 7/6 vancomycin > 7/8 7/6 Zosyn >> 7/12 (Morganella)  Interim history/subjective:  Remains on 5L ATC, sats 99-100% Afebrile    Objective   Blood pressure (!) 156/62, pulse 82, temperature 98.7 F (37.1 C), temperature source Axillary, resp. rate 20, height 5\' 1"  (1.549 m), weight 71.4 kg, SpO2 99 %.    FiO2 (%):  [28 %] 28 %   Intake/Output Summary (Last 24 hours) at 10/20/2019 1256 Last data filed at 10/20/2019 0941 Gross per 24 hour  Intake 1200 ml  Output 300 ml  Net 900 ml   Filed Weights   10/18/19 0351 10/19/19 0435 10/20/19 0333  Weight: 74.1 kg 71.6 kg 71.4 kg    Examination: General: adult female lying in bed in NAD, appears chronically ill   HEENT: MM pink/moist, #6 cuffless trach midline c/d/i  Neuro: Awake, alert CV: s1s2 rrr, no m/r/g PULM: non-labored on ATC, mild upper airway noise around trach, clear rhonchi bilaterally  GI: soft, bsx4 active  Extremities: warm/dry, trace generalized edema  Skin: no rashes or lesions  Resolved Hospital Problem list   Hypotension  AKI Sepsis VAP - Morganella s/p zosyn ending 09/23/19  Assessment & Plan:    Acute on chronic respiratory failure with hypoxia requiring mechanical ventilation.   Failed trial of extubation due to upper airway stridor. Cords visibly scarred on reintubation. S/P trach 7/4 Hypoxia on 7/26 felt to be secondary to secretions. CTA  demonstrates some apical GGO possibly representing atypical infection. She has improved with pulmonary hygiene alone. No PE.  -wean O2 to off for sats >90% -not a candidate for decannulation given vocal cord issues  -pulmonary hygiene, IS, mobilize  -trach care per protocol  -continue PMV efforts  -tracheal suction PRN   Right MCA CVA due to bilateral intracranial stenoses  -s/p RT ICA cer/petrous junction balloon angioplasty for severe stenosis c/b right thigh and retroperitoneal hematoma.  Acute Metabolic Encephalopathy -Continues to slowly show improvement -per primary  -PT/OT efforts  -Pending SNF rehab placement   PCCM will see once weekly.  Please call sooner if new needs arise.    8/26, MSN, NP-C [facility] 10/20/2019, 1:04 PM   Please see [email or web address] for pager details.

## 2019-10-20 NOTE — Progress Notes (Signed)
Referring Physician(s): Melvenia Beam  Supervising Physician: Luanne Bras  Patient Status:  [facility] - In-pt  Chief Complaint: None- tracheostomy without sedation, passy muir not in place  Subjective:  Right ICA cervical/petrous junction stenosis s/p revascularization using balloon angioplasty via right radial and right femoral approach6/14/2021by Dr. Estanislado Pandy. Mild-moderate right thigh (with some retroperitoneal component) hematoma. Acute blood loss anemia, Jehovah's Witness. Patientawakelaying in bed, tracheostomy in place,no sedation.Shefollowssimple commands. Passy muir not in place. Can move all extremities today. Right groin and right radial puncture sites c/d/i. Most recenthgbwas8.on 10/16/2019 (grossly stable fromlasthgb 8.5on 10/13/2019). No family at bedside this AM.   Allergies: Patient has no known allergies.  Medications: Prior to Admission medications   Medication Sig Start Date End Date Taking? Authorizing Provider  amLODipine (NORVASC) 10 MG tablet Take 1 tablet (10 mg total) by mouth daily. 05/12/19  Yes Minette Brine, FNP  aspirin EC 81 MG tablet Take 81 mg by mouth every evening.   Yes [provider]  Calcium Carbonate-Vitamin D (CALCIUM-D PO) Take 2 tablets by mouth daily at 12 noon.    Yes [provider]  carvedilol (COREG) 6.25 MG tablet TAKE 1 TABLET(6.25 MG) BY MOUTH TWICE DAILY Patient taking differently: Take 6.25 mg by mouth 2 (two) times daily with a meal.  07/31/19  Yes Minette Brine, FNP  hydrALAZINE (APRESOLINE) 50 MG tablet Take 1 tablet (50 mg total) by mouth 2 (two) times daily. 06/16/19 09/14/19 Yes Elouise Munroe, MD  Insulin Degludec-Liraglutide (XULTOPHY) 100-3.6 UNIT-MG/ML SOPN Inject 30 Units into the skin daily. 08/14/19  Yes Minette Brine, FNP  telmisartan-hydrochlorothiazide (MICARDIS HCT) 40-12.5 MG tablet Take 1 tablet by mouth daily. 07/31/19  Yes Minette Brine, FNP  ticagrelor (BRILINTA) 90 MG TABS  tablet Take 1 tablet (90 mg total) by mouth 2 (two) times daily. 06/12/19  Yes Elouise Munroe, MD  vitamin C (ASCORBIC ACID) 250 MG tablet Take 500 mg by mouth daily at 12 noon.   Yes [provider]  blood glucose meter kit and supplies KIT Dispense based on patient and insurance preference. Use up to four times daily as directed. (FOR ICD-9 250.00, 250.01). 05/10/18   Minette Brine, FNP  Blood Glucose Monitoring Suppl (TRUE METRIX METER) w/Device KIT 1 each by Does not apply route in the morning, at noon, in the evening, and at bedtime. 05/22/19   Minette Brine, FNP  Evolocumab (REPATHA SURECLICK) 353 MG/ML SOAJ Inject 140 mg into the skin every 14 (fourteen) days. 06/30/19   Elouise Munroe, MD  glucose blood (TRUE METRIX BLOOD GLUCOSE TEST) test strip Check blood sugar 4 times a day before meals and bedtime 05/22/19   Minette Brine, FNP  Insulin Glargine-Lixisenatide (SOLIQUA) 100-33 UNT-MCG/ML SOPN Inject 25 Units into the skin daily. Patient taking differently: Inject 30 Units into the skin daily.  07/14/19   Minette Brine, FNP  Multiple Vitamin (MULTIVITAMIN PO) Take 1 tablet by mouth daily at 12 noon.     [provider]  Omega-3 Fatty Acids (OMEGA-3 PLUS PO) Take 2 capsules by mouth daily at 12 noon.    [provider]     Vital Signs: BP (!) 156/62 (BP Location: Left Arm)   Pulse 82   Temp 98.7 F (37.1 C) (Axillary)   Resp (!) 22   Ht _0  (1.549 m)   Wt 157 lb 6.5 oz (71.4 kg)   SpO2 100%   BMI 29.74 kg/m   Physical Exam Vitals and nursing note reviewed.  Constitutional:      General: She is not in acute distress.    Comments: Tracheostomy without sedation.  Pulmonary:     Effort: Pulmonary effort is normal. No respiratory distress.     Comments: Tracheostomy without sedation. Skin:    General: Skin is warm and dry.     Comments: Right groin puncture site soft without active bleeding or hematoma. Right radial puncture site soft without active  bleeding or hematoma.    Neurological:     Mental Status: She is alert.     Comments: Tracheostomy without sedation, passy muir not in place. Follows simple commands. PERRL bilaterally. Follows commands with all extremities (right side moving spontaneously, bends LUE at elbow and lifts/holds off bed, can move LLE from side to side on bed but cannot lift).       Imaging: No results found.  Labs:  CBC: Recent Labs    10/09/19 0731 10/10/19 0507 10/13/19 0614 10/16/19 0400  WBC 4.8 5.2 4.2 5.5  HGB 7.6* 8.4* 8.5* 8.6*  HCT 27.4* 29.0* 29.5* 29.7*  PLT 274 307 310 324    COAGS: Recent Labs    11/27/18 0728 08/18/19 0705 08/26/19 1715 08/27/19 0230  INR 0.9 0.9 1.1 1.2    BMP: Recent Labs    10/06/19 1155 10/10/19 0507 10/13/19 0614 10/16/19 0400  NA 143 141 138 138  K 3.8 3.3* 3.5 4.2  CL 103 104 103 99  CO2 29 29 32 30  GLUCOSE 223* 145* 153* 224*  BUN 16 <5* 8 13  CALCIUM 9.2 9.1 8.6* 9.1  CREATININE 0.71 0.55 0.48 0.68  GFRNONAA >60 >60 >60 >60  GFRAA >60 >60 >60 >60    LIVER FUNCTION TESTS: Recent Labs    09/17/19 0703 09/17/19 0703 09/20/19 0500 09/21/19 0500 09/26/19 0537 10/06/19 1155  BILITOT 1.1  --  0.7 0.3 0.5  --   AST 32  --  _0 --   ALT 23  --  29 36 42  --   ALKPHOS 82  --  95 81 57  --   PROT 6.6  --  7.4 7.1 5.5*  --   ALBUMIN 1.6*   < > 1.5* 1.6* 1.8* 2.5*   < > = values in this interval not displayed.    Assessment and Plan:  Right ICA cervical/petrous junction stenosis s/p revascularization using balloon angioplasty via right radial and right femoral approach6/14/2021by Dr. Estanislado Pandy. Mild-moderate right thigh (with some retroperitoneal component) hematoma. Acute blood loss anemia, Jehovah's Witness. Patient's conditionstable-tracheostomy withoutsedation,morealert today, speech clear with passy muir in place, follows simple commands,moving all extremities with weakness of left side. Right groinand right  radial puncture sitesstable, no signs of active bleeding. Hgbgrossly stable at8.6 on 10/16/2019-receiving supplementation per severe anemia protocol. For possible transfer toSNF,awaitingfamily decision regarding SNF placement. Continue to hold Brilinta/Aspirin/Lovenox.Agree withTRHplans toavoid excess blood drawl. Further plans perTRH- appreciate and agree with management. NIR to follow.   Electronically Signed: Earley Abide, PA-C 10/20/2019, 9:30 AM   I spent a total of 15 Minutes at the the patient's bedside AND on the patient's hospital floor or unit, greater than 50% of which was counseling/coordinating care for right ICA stenosis s/p revascularization.

## 2019-10-20 NOTE — Progress Notes (Addendum)
Nutrition Follow-up  DOCUMENTATION CODES:   Not applicable  INTERVENTION:   Transition to nocturnal tube feeding: - Jevity 1.5 @ 70 ml/hr x 14 hrs (6pm-8am) via PEG - ProSource TF 45 ml TID  Tube feeding regimen provides 1590 kcal, 96 grams of protein, and 745 ml of H2O. Meets 100% of needs.   NUTRITION DIAGNOSIS:   Inadequate oral intake related to inability to eat as evidenced by NPO status.  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  6/14 - s/p balloon angioplasty for severe stenosis, pt developed lethargy and increased L sided weakness found to have new watershed infarcts, started on vasopressors for cerebral perfusion 6/16 - cortrak placed, tip in stomach 6/17 - pt with increased agitation requiring increased O2 6/18 - pt pulled out Cortrak, Cortrak replaced 6/19 - Cortrak removed 6/20 - intubated 6/23 - Cortrak replaced (tip gastric per Cortrak team), extubated, later reintubated 7/3 - s/p trach  7/30- s/p PEG  Diet advanced to DYS 2 with nectar thick liquids on 8/4. Since then meal completions charted as 0-50%. Tolerating tube feeding at goal rate. Denies abdominal pain. Transition to nocturnal feedings to promote appetite during the day.   Admission weight: 77.5  Current weight: 71.4   Medications: ferrous sulfate, SS novolog, lantus Labs: CBG 82-238  Diet Order:   Diet Order            DIET DYS 2 Room service appropriate? No; Fluid consistency: Nectar Thick  Diet effective now                 EDUCATION NEEDS:   Not  appropriate for education at this time  Skin:  Skin Assessment: Skin Integrity Issues: Stage II: sacrum, buttocks x2 Incisions: right groin  Last BM:  8/7  Height:   Ht Readings from Last 1 Encounters:  08/31/19 5\' 1"  (1.549 m)    Weight:   Wt Readings from Last 1 Encounters:  10/20/19 71.4 kg    Ideal Body Weight:  47.7 kg  BMI:  Body mass index is 29.74 kg/m.  Estimated Nutritional Needs:   Kcal:  1550-1750  Protein:  90-105 grams  Fluid:  >1.6 L/day  12/20/19 RD, LDN Clinical Nutrition Pager listed in AMION

## 2019-10-20 NOTE — Progress Notes (Signed)
PROGRESS NOTE    Donna [name]  UOH:[id number] DOB: January 31, 1951 DOA: 08/25/2019 PCP: Minette Brine, FNP      Brief Narrative:  Donna [name] is a 69 y.o. F with hx HTN, CVA, residual left hemiparesis, DM, anemia, obesity, and cerebrovascular disease admitted mid-June for right ICA petrous/cervical junction angioplasty and stent due to symptomatic severe intracranial stenosis but post-operatively, developed confusion, worsening left hemiparesis night after procedure.  Medicine and Neurology were consulted, patient noted to have had hypotension and new anemia, and subsequent MRI showed evidence of watershed infarcts.  CT abdomen showed small RP hematoma.  She was transferred to the ICU on neosynephrine.  Patient stabilized until around 6/19 when she was intubated due to pulmonary edema and severe encephalopathy.  Failed extubation 6/28, tracheostomy 7/3.  Subsequently stabilized, denied for LTAC.  7/25, patient again developed respiratory distress, CTA chest showed GGO in upper lobes, patient treated for aspiration pneumonia.  7/30 PEG placed.  See summary note from 8/3.        Assessment & Plan:  Watershed infarct stroke, right MCA territory -Continue atorvastatin -Continue to hold aspirin, Brilinta, Lovenox   Acute metabolic encephalopathy due to massive stroke -Continue famotidine, Seroquel -Continue tube feeds  Chronic respiratory failure Weaned to room air.  Humidified air only for comfort.  Stable for discharge to SNF. -Pulmonary trach team following weekly -Continue Pulmicort, nebs  Anemia Previously received folate, B12, and Epogen   -Continue iron -Hold aspirin, Brilinta -Avoid excess blood draws  Diabetes Glucoses at goal -Continue Lantus -Continue SS corrections  Hypertension Blood pressure still above goal -Continue amlodipine, hydralazine           Disposition: Status is: Inpatient  Remains inpatient appropriate because:Unsafe d/c  plan   Dispo: The patient is from: Home              Anticipated d/c is to: TBD              Anticipated d/c date is: > 3 days              Patient currently is medically stable to d/c. to a lower level of care if appropriate resources can be put in place   The patient was admitted to the hospitalist service after a massive stroke.  She is now requiring continued full nursing cares, and likely will for a considerable period of time.  She is making gradual improvement in cognition, physical strength, but still has substantial hemiparesis and needs cares with all ADLs  We are attempting to make placement as soon as possible and she is stable for transfer to a lower level of care but not safe for discharge to home.           MDM: The below labs and imaging reports were reviewed and summarized above.  Medication management as above.   DVT prophylaxis: Place and maintain sequential compression device Start: 09/26/19 1319  Code Status: FULL Family Communication: Sister updated two days ago            Subjective: No new fever, respiratory effort, pain, vomiting, or diarrhea  Objective: Vitals:   10/20/19 1258 10/20/19 1532 10/20/19 1545 10/20/19 1642  BP: (!) 161/65  136/78 132/69  Pulse: 78 78 78   Resp: (!) 24 (!) 22 (!) 21   Temp:   98.4 F (36.9 C)   TempSrc:   Oral   SpO2:  97% 97%   Weight:      Height:  Intake/Output Summary (Last 24 hours) at 10/20/2019 1840 Last data filed at 10/20/2019 0941 Gross per 24 hour  Intake 1200 ml  Output 300 ml  Net 900 ml   Filed Weights   10/18/19 0351 10/19/19 0435 10/20/19 0333  Weight: 74.1 kg 71.6 kg 71.4 kg    Examination: General appearance: Adult female, lying in bed, trach and PEG, interactive, speaks in one-word responses with Passy-Muir valve in place, interactive, makes eye contact some HEENT: Tracheostomy in place, unremarkable, oropharynx moist, no oral lesions, hearing normal, no nasal deformity,  discharge, or epistaxis Skin: No suspicious rashes or lesions on the face, neck, arms, back, or abdomen Cardiac: RRR, no murmurs, no lower extremity edema Respiratory: Normal respiratory rate and rhythm, lungs clear without rales or wheezes Abdomen: Abdomen soft no tenderness palpation or guarding, no ascites or distention MSK:  Neuro: Awake and makes eye contact, upper extremity extremely weak, legs flaccid, laconic, but able to speak with Passy-Muir valve Psych: Attention seems diminished, affect flat       Data Reviewed: I have personally reviewed following labs and imaging studies:  CBC: Recent Labs  Lab 10/16/19 0400  WBC 5.5  HGB 8.6*  HCT 29.7*  MCV 100.3*  PLT 536   Basic Metabolic Panel: Recent Labs  Lab 10/16/19 0400  NA 138  K 4.2  CL 99  CO2 30  GLUCOSE 224*  BUN 13  CREATININE 0.68  CALCIUM 9.1   GFR: Estimated Creatinine Clearance: 60.8 mL/min (by C-G formula based on SCr of 0.68 mg/dL). Liver Function Tests: No results for input(s): AST, ALT, ALKPHOS, BILITOT, PROT, ALBUMIN in the last 168 hours. No results for input(s): LIPASE, AMYLASE in the last 168 hours. No results for input(s): AMMONIA in the last 168 hours. Coagulation Profile: No results for input(s): INR, PROTIME in the last 168 hours. Cardiac Enzymes: No results for input(s): CKTOTAL, CKMB, CKMBINDEX, TROPONINI in the last 168 hours. BNP (last 3 results) No results for input(s): PROBNP in the last 8760 hours. HbA1C: No results for input(s): HGBA1C in the last 72 hours. CBG: Recent Labs  Lab 10/19/19 2337 10/20/19 0343 10/20/19 0754 10/20/19 1142 10/20/19 1519  GLUCAP 179* 152* 179* 238* 237*   Lipid Profile: No results for input(s): CHOL, HDL, LDLCALC, TRIG, CHOLHDL, LDLDIRECT in the last 72 hours. Thyroid Function Tests: No results for input(s): TSH, T4TOTAL, FREET4, T3FREE, THYROIDAB in the last 72 hours. Anemia Panel: No results for input(s): VITAMINB12, FOLATE, FERRITIN,  TIBC, IRON, RETICCTPCT in the last 72 hours. Urine analysis:    Component Value Date/Time   COLORURINE AMBER (A) 09/16/2019 0931   APPEARANCEUR HAZY (A) 09/16/2019 0931   LABSPEC 1.021 09/16/2019 0931   PHURINE 5.0 09/16/2019 0931   GLUCOSEU NEGATIVE 09/16/2019 0931   HGBUR NEGATIVE 09/16/2019 0931   BILIRUBINUR NEGATIVE 09/16/2019 0931   BILIRUBINUR negative 01/02/2018 1636   KETONESUR NEGATIVE 09/16/2019 0931   PROTEINUR 30 (A) 09/16/2019 0931   UROBILINOGEN 0.2 01/02/2018 1636   NITRITE NEGATIVE 09/16/2019 0931   LEUKOCYTESUR SMALL (A) 09/16/2019 0931   Sepsis Labs: '@LABRCNTIP'$ (procalcitonin:4,lacticacidven:4)  ) No results found for this or any previous visit (from the past 240 hour(s)).       Radiology Studies: No results found.      Scheduled Meds: . acetylcysteine  2 mL Nebulization TID  . amLODipine  10 mg Per Tube Daily  . atorvastatin  40 mg Per NG tube Daily  . budesonide (PULMICORT) nebulizer solution  0.5 mg Nebulization BID  .  chlorhexidine gluconate (MEDLINE KIT)  15 mL Mouth Rinse BID  . Chlorhexidine Gluconate Cloth  6 each Topical Daily  . famotidine  20 mg Per Tube BID  . feeding supplement (JEVITY 1.5 CAL/FIBER)  1,000 mL Per Tube Q24H  . feeding supplement (PROSource TF)  45 mL Per Tube TID  . ferrous sulfate  220 mg Per Tube TID WC  . hydrALAZINE  100 mg Per Tube Q6H  . hydrocerin   Topical TID  . insulin aspart  0-15 Units Subcutaneous Q4H  . insulin aspart  4 Units Subcutaneous Q4H  . insulin glargine  15 Units Subcutaneous Daily  . ipratropium-albuterol  3 mL Nebulization TID  . mouth rinse  15 mL Mouth Rinse 10 times per day  . QUEtiapine  100 mg Per Tube QHS  . sodium chloride flush  10-40 mL Intracatheter Q12H   Continuous Infusions: . sodium chloride Stopped (10/09/19 0900)     LOS: 56 days    Time spent: 25 minutes    Edwin Dada, MD [facility] 10/20/2019, 6:40 PM     Please page though [place] or Epic  secure chat:  For [employer], Adult nurse

## 2019-10-20 NOTE — Progress Notes (Signed)
Physical Therapy Treatment Patient Details Name: Donna [name] MRN: [id number] DOB: September 02, 1950 Today's Date: 10/20/2019    History of Present Illness 69 y.o. female with PMH significant for HTN, HLD, CVA 06/2018, DM, anemia, obesity, OSA, and OA. She is known to [facility] and has been followed by Dr. Corliss Skains since 10/2018. Pt underwent R ICA angioplasty on 6/14. Pt with decline in neuro status and hemoglobin on 6/15, found to have a large R groin hematoma.  Pt has had Hbg 5.0 or less and is a Jehovah's witness.  She was intubated on 6/19. Pt underwent tracheostomy on 7/3. PT found to be septic with RLL PNA on 7/6. 7/13 trach tolerating extubation. Patient had a significant event on 7/25 resulting in respiratory distress, oxygen desaturation, and retroperitoneal hematomas in the right iliac/inguinal regions.    PT Comments    Patient was received laying in hospital bed, smiling when PT/OT walked in. Placed PMV in trach, however she was less talkative today and replied with inconsistent one-word responses, head nods, and smiles. Isometric contractions of LLE noted when putting on socks and trying to move legs off EOB. With cueing and maxAx2, patient transferred supine to sit and sat EOB for 10 mins with significant L and posterior lean. TotalAx2 to transfer sit <> stand in Ste[place]. TotalA to maintain upright posture with heavy L lateral lean. Moved Steady to recliner chair and transferred to sitting. O2 sat WNL after transfers. Denied any dizziness/lightheadedness. She was left sitting in the chair, alarm set, PMV disconnected, and all needs within reach. Patient stated "I have to go to the bathroom" at end of session. Nursing staff was notified she was sitting in chair and that patient needed to use the bathroom.     Follow Up Recommendations  SNF     Equipment Recommendations  Wheelchair (measurements PT);Wheelchair cushion (measurements PT);Hospital bed    Recommendations for Other Services        Precautions / Restrictions Precautions Precautions: Fall Precaution Comments: trach, PEG, dense L hemiplegia, can now wear PMV with full supervision by staff Required Braces or Orthoses: Other Brace Other Brace: L resting hand splint, prevalon boots Restrictions Weight Bearing Restrictions: No    Mobility  Bed Mobility Overal bed mobility: Needs Assistance Bed Mobility: Supine to Sit     Supine to sit: Total assist;+2 for physical assistance Sit to supine: Total assist;+2 for physical assistance   General bed mobility comments: Total A x 2 for bed mobilty, decreased attention to task and following commands to advance B LE to EOB today  Transfers Overall transfer level: Needs assistance Equipment used: Ambulation equipment used Transfers: Sit to/from Stand Sit to Stand: Total assist;+2 physical assistance         General transfer comment: Total A x 2 for sit to stand in [place], muscle activation noted but unable to generate enough force. Pt transferred to recliner chair via [facility]   Ambulation/Gait                 Stairs             Wheelchair Mobility    Modified Rankin (Stroke Patients Only)       Balance Overall balance assessment: Needs assistance Sitting-balance support: Single extremity supported;Feet supported Sitting balance-Leahy Scale: Poor Sitting balance - Comments: sat EOB >10 min, requiring min A overall to maintain sitting balance with L lateral lean Postural control: Posterior lean;Left lateral lean Standing balance support: Single extremity supported;During functional activity Standing balance-Leahy Scale: Zero  Standing balance comment: Total A to maintain standing balance in [place] with L lateral lean                            Cognition Arousal/Alertness: Awake/alert Behavior During Therapy: WFL for tasks assessed/performed Overall Cognitive Status: Impaired/Different from baseline Area of Impairment: Attention;Problem  solving;Safety/judgement;Awareness                   Current Attention Level: Sustained   Following Commands: Follows one step commands with increased time Safety/Judgement: Decreased awareness of safety;Decreased awareness of deficits Awareness: Intellectual Problem Solving: Slow processing;Requires verbal cues;Decreased initiation;Difficulty sequencing;Requires tactile cues General Comments: Pt with improving alertness and responses to therapists via nodding head and use of PMV >50% of the time      Exercises General Exercises - Upper Extremity Wrist Flexion: Self ROM;Left;5 reps Wrist Extension: Self ROM;Left;5 reps Digit Composite Flexion: Self ROM;Left;5 reps Composite Extension: Self ROM;Left;5 reps    General Comments General comments (skin integrity, edema, etc.): Pt SpO2 WFL after transfer to recliner chair, no reports of dizziness.      Pertinent Vitals/Pain Pain Assessment: No/denies pain Pain Score: 0-No pain Pain Location: reports itching - rn made aware Pain Intervention(s): Monitored during session    Home Living                      Prior Function            PT Goals (current goals can now be found in the care plan section) Acute Rehab PT Goals Patient Stated Goal: none stated PT Goal Formulation: Patient unable to participate in goal setting Time For Goal Achievement: 10/23/19 Potential to Achieve Goals: Fair Progress towards PT goals: Progressing toward goals    Frequency    Min 3X/week      PT Plan Current plan remains appropriate    Co-evaluation PT/OT/SLP Co-Evaluation/Treatment: Yes Reason for Co-Treatment: Complexity of the patient's impairments (multi-system involvement);Necessary to address cognition/behavior during functional activity;To address functional/ADL transfers;For patient/therapist safety PT goals addressed during session: Mobility/safety with mobility;Strengthening/ROM;Balance OT goals addressed during  session: ADL's and self-care;Strengthening/ROM      AM-PAC PT "6 Clicks" Mobility   Outcome Measure  Help needed turning from your back to your side while in a flat bed without using bedrails?: Total Help needed moving from lying on your back to sitting on the side of a flat bed without using bedrails?: Total Help needed moving to and from a bed to a chair (including a wheelchair)?: Total Help needed standing up from a chair using your arms (e.g., wheelchair or bedside chair)?: Total Help needed to walk in hospital room?: Total Help needed climbing 3-5 steps with a railing? : Total 6 Click Score: 6    End of Session Equipment Utilized During Treatment: Gait belt Activity Tolerance: Patient tolerated treatment well Patient left: in chair;with chair alarm set;with call bell/phone within reach Nurse Communication: Mobility status;Need for lift equipment PT Visit Diagnosis: Other abnormalities of gait and mobility (R26.89);Muscle weakness (generalized) (M62.81);Other symptoms and signs involving the nervous system (R29.898);Hemiplegia and hemiparesis Hemiplegia - Right/Left: Left Hemiplegia - dominant/non-dominant: Non-dominant Hemiplegia - caused by: Cerebral infarction     Time:  -     Charges:                       Elisha Ponder, SPT, ATC'

## 2019-10-20 NOTE — Progress Notes (Signed)
Occupational Therapy Treatment Patient Details Name: Donna [name] MRN: [id number] DOB: 02/12/1951 Today's Date: 10/20/2019    History of present illness 69 y.o. female with PMH significant for HTN, HLD, CVA 06/2018, DM, anemia, obesity, OSA, and OA. She is known to [facility] and has been followed by Dr. Corliss Skains since 10/2018. Pt underwent R ICA angioplasty on 6/14. Pt with decline in neuro status and hemoglobin on 6/15, found to have a large R groin hematoma.  Pt has had Hbg 5.0 or less and is a Jehovah's witness.  She was intubated on 6/19. Pt underwent tracheostomy on 7/3. PT found to be septic with RLL PNA on 7/6. 7/13 trach tolerating extubation. Patient had a significant event on 7/25 resulting in respiratory distress, oxygen desaturation, and retroperitoneal hematomas in the right iliac/inguinal regions.   OT comments  Pt progressing gradually with OT goals. Instructed pt in figure four positioning bed level for LB dressing with Total A still required but pt participatory. Pt continues to require significant assist for bed mobility and overall Min A to maintain sitting balance EOB, but with improved alertness and recognition of one step commands. Pt progressed to OOB activities and transferred to recliner chair via [place] with Total A x 2, requiring Total A to maintain balance. Began instruction in self ROM for L hand/wrist with continued training needed to improve carryover.    Follow Up Recommendations  SNF    Equipment Recommendations  Wheelchair cushion (measurements OT);Wheelchair (measurements OT);Hospital bed;Other (comment)    Recommendations for Other Services      Precautions / Restrictions Precautions Precautions: Fall Precaution Comments: trach, PEG, dense L hemiplegia, can now wear PMV with full supervision by staff Required Braces or Orthoses: Other Brace Other Brace: L resting hand splint, prevalon boots Restrictions Weight Bearing Restrictions: No       Mobility Bed  Mobility Overal bed mobility: Needs Assistance Bed Mobility: Supine to Sit     Supine to sit: Total assist;+2 for physical assistance     General bed mobility comments: Total A x 2 for bed mobilty, decreased attention to task and following commands to advance B LE to EOB today  Transfers Overall transfer level: Needs assistance Equipment used: Ambulation equipment used Transfers: Sit to/from Stand Sit to Stand: Total assist;+2 physical assistance         General transfer comment: Total A x 2 for sit to stand in [place], muscle activation noted but unable to generate enough force. Pt transferred to recliner chair via [employer] Overall balance assessment: Needs assistance Sitting-balance support: Single extremity supported;Feet supported Sitting balance-Leahy Scale: Poor Sitting balance - Comments: sat EOB >10 min, requiring min A overall to maintain sitting balance with L lateral lean Postural control: Posterior lean;Left lateral lean Standing balance support: Single extremity supported;During functional activity Standing balance-Leahy Scale: Zero Standing balance comment: Total A to Max A to maintain standing balance in [place] with L lateral lean                           ADL either performed or assessed with clinical judgement   ADL Overall ADL's : Needs assistance/impaired                     Lower Body Dressing: Total assistance;Bed level Lower Body Dressing Details (indicate cue type and reason): Pt attempting to assist with figure four positioning bed level for donning socks. Once donned around L  toes, pt able to assist in pulling socks up but with great effort and decreased attention to task               General ADL Comments: Pt willing to work with therapists, improving muscle contraction of L side though continues with extreme weakness with standing attempts     Vision   Vision Assessment?: Vision impaired- to be further tested in  functional context   Perception     Praxis      Cognition Arousal/Alertness: Awake/alert Behavior During Therapy: WFL for tasks assessed/performed Overall Cognitive Status: Impaired/Different from baseline Area of Impairment: Attention;Problem solving;Safety/judgement;Awareness                   Current Attention Level: Sustained   Following Commands: Follows one step commands with increased time Safety/Judgement: Decreased awareness of safety;Decreased awareness of deficits Awareness: Intellectual Problem Solving: Slow processing;Requires verbal cues;Decreased initiation;Difficulty sequencing;Requires tactile cues General Comments: Pt with improving alertness and responses to therapists via nodding head and use of PMV >50% of the time        Exercises General Exercises - Upper Extremity Wrist Flexion: Self ROM;Left;5 reps Wrist Extension: Self ROM;Left;5 reps Digit Composite Flexion: Self ROM;Left;5 reps Composite Extension: Self ROM;Left;5 reps   Shoulder Instructions       General Comments Pt SpO2 WFL after transfer to recliner chair, no reports of dizziness. Instructed pt in self ROM for hand and wrist first with pt attempting to complete but noted difficulty     Pertinent Vitals/ Pain       Pain Assessment: No/denies pain  Home Living                                          Prior Functioning/Environment              Frequency  Min 2X/week        Progress Toward Goals  OT Goals(current goals can now be found in the care plan section)  Progress towards OT goals: Progressing toward goals  Acute Rehab OT Goals Patient Stated Goal: none stated OT Goal Formulation: Patient unable to participate in goal setting Time For Goal Achievement: 10/29/19 Potential to Achieve Goals: Fair ADL Goals Pt Will Perform Grooming: with mod assist;sitting Pt Will Perform Upper Body Bathing: with max assist;sitting Additional ADL Goal #1: Pt  will tolerate splint wear Lt UE Additional ADL Goal #2: Pt to demonstrate ability to maintain static sitting balance EOB with no more than min guard for 10 minutes in order to improve endurance for ADLs Additional ADL Goal #3: Pt to scan and identify at least 3 ADL items in L visual field to improve safety and awareness during daily tasks.  Plan Discharge plan remains appropriate    Co-evaluation    PT/OT/SLP Co-Evaluation/Treatment: Yes Reason for Co-Treatment: Complexity of the patient's impairments (multi-system involvement);Necessary to address cognition/behavior during functional activity;For patient/therapist safety;To address functional/ADL transfers   OT goals addressed during session: ADL's and self-care;Strengthening/ROM      AM-PAC OT "6 Clicks" Daily Activity     Outcome Measure   Help from another person eating meals?: Total Help from another person taking care of personal grooming?: A Lot Help from another person toileting, which includes using toliet, bedpan, or urinal?: Total Help from another person bathing (including washing, rinsing, drying)?: Total Help from another person to put on and  taking off regular upper body clothing?: Total Help from another person to put on and taking off regular lower body clothing?: Total 6 Click Score: 7    End of Session Equipment Utilized During Treatment: Gait belt;Oxygen  OT Visit Diagnosis: Muscle weakness (generalized) (M62.81);Cognitive communication deficit (R41.841);Hemiplegia and hemiparesis Symptoms and signs involving cognitive functions: Cerebral infarction Hemiplegia - Right/Left: Left Hemiplegia - dominant/non-dominant: Non-Dominant Hemiplegia - caused by: Cerebral infarction   Activity Tolerance Patient tolerated treatment well   Patient Left in chair;with call bell/phone within reach   Nurse Communication Mobility status        Time: 8588-5027 OT Time Calculation (min): 31 min  Charges: OT General  Charges $OT Visit: 1 Visit OT Treatments $Therapeutic Activity: 8-22 mins  Lorre Munroe, OTR/L   Lorre Munroe 10/20/2019, 11:59 AM

## 2019-10-21 DIAGNOSIS — I6521 Occlusion and stenosis of right carotid artery: Secondary | ICD-10-CM | POA: Diagnosis not present

## 2019-10-21 LAB — GLUCOSE, CAPILLARY
Glucose-Capillary: 124 mg/dL — ABNORMAL HIGH (ref 70–99)
Glucose-Capillary: 98 mg/dL (ref 70–99)
Glucose-Capillary: 139 mg/dL — ABNORMAL HIGH (ref 70–99)
Glucose-Capillary: 165 mg/dL — ABNORMAL HIGH (ref 70–99)
Glucose-Capillary: 161 mg/dL — ABNORMAL HIGH (ref 70–99)
Glucose-Capillary: 200 mg/dL — ABNORMAL HIGH (ref 70–99)
Glucose-Capillary: 217 mg/dL — ABNORMAL HIGH (ref 70–99)

## 2019-10-21 NOTE — Progress Notes (Signed)
Speech Language Pathology Treatment: Dysphagia  Patient Details Name: Donna [name] MRN: [id number] DOB: Mar 19, 1950 Today's Date: 10/21/2019 Time: 3007-6226 SLP Time Calculation (min) (ACUTE ONLY): 46 min  Assessment / Plan / Recommendation Clinical Impression  Pt in bed leaning toward the right and reclined. RN present and assisted SLP to reposition pt.  SLP obtained toothbrush/paste and had pt brush her teeth educating her to importance of brushing to remove biofilm for maximal pulmonary health.  Provided oral suction for pt to use to clear secretions mixed with toothpaste. Using teach back and written cues, pt able to verbalize importance of oral care BID.    Breakfast arrived and skilled treatment included reinforcing swallowing compensation strategies and PMSV use.  Pt managed PMSV being in place for entire session of approximately 40 minutes without breath stacking or negative changes with RR.  Intake today included approximately 1/2 of her ground sausage, 1/3 of potatoes, few bites of her grits, 7 ounces nectar thickened juice and several sips of Mighty Shake.  She benefited from max verbal and visual cues to clear mouth of food already placed prior to taking more.  Pt does demonstrate delayed coughing intermittently mostly after solids that is not productive.  Ice chips also provided with pt performing adequate mastication and delayed cough after swallow.  Pt's volitional cough is weak and RN reports pt congested in upper airway.  Encouraged pt's intake has improved today and hopefully will continue with nocturnal tube feeding.    Pt's PEG placed this hospital coarse is appreciated to assure adequate nutrition and hydration given level of dysphagia, inconsistent po intake.   Pt making good progress in compensation for dysphagia, understanding importance of and performing adequate oral care.    At this time, recommend pt use PMSV freely during the day with intermittent supervision.  She  reports *albeit delayed responses continue* to being comfortable with said plan.  Updated her PMSV information in room.  Continue SLP.       HPI HPI: 69 y.o. female with PMH significant for HTN, HLD, CVA 06/2018, DM, anemia, obesity, OSA, and OA. Pt underwent R ICA angioplasty on 6/14. Pt with decline in neuro status and hemoglobin on 6/15, found to have a large R groin hematoma.  Pt has had Hbg 5.0 or less and is a Jehovah's witness. MRI showed scattered small acute infarcts along the right MCA/watershed territory. Solitary small acute infarct in the left parietal cortex and right cerebellum. She was intubated on 6/19. Pt underwent tracheostomy on 7/3. PT found to be septic with RLL PNA on 7/6.  MBS 7/22 - started on dys1/nectars.  Patient now with a Shiley #6 cuffless trach in place.  She has planned PEG placement 7/30.        SLP Plan  Continue with current plan of care       Recommendations  Diet recommendations: Dysphagia 2 (fine chop);Nectar-thick liquid Liquids provided via: Cup;Straw Medication Administration: Via alternative means Supervision: Staff to assist with self feeding Compensations: Slow rate;Small sips/bites;Other (Comment);Clear throat intermittently (pmsv with meals) Postural Changes and/or Swallow Maneuvers: Seated upright 90 degrees;Upright 30-60 min after meal      Patient may use Passy-Muir Speech Valve: During all therapies with supervision PMSV Supervision: Full MD: Please consider changing trach tube to : Smaller size;Cuffless         Oral Care Recommendations: Oral care QID Follow up Recommendations: Skilled Nursing facility;24 hour supervision/assistance SLP Visit Diagnosis: Aphonia (R49.1);Dysphagia, unspecified (R13.10) Plan: Continue with current plan of  care       GO                Donna [name] 10/21/2019, 10:32 AM   Rolena Infante, MS [facility] SLP Acute Rehab Service Office [phone]

## 2019-10-21 NOTE — Progress Notes (Signed)
Referring Physician(s): Melvenia Beam  Supervising Physician: Luanne Bras  Patient Status:  [facility] - In-pt  Chief Complaint: None  Subjective:  Right ICA cervical/petrous junction stenosis s/p revascularization using balloon angioplasty via right radial and right femoral approach6/14/2021by Dr. Estanislado Pandy. Mild-moderate right thigh (with some retroperitoneal component) hematoma. Acute blood loss anemia, Jehovah's Witness. Patientawakelaying in bed, tracheostomy in place,no sedation.Shefollowssimple commands. Passy muir in place- speech clear. Can move all extremities today. Right groin and right radial puncture sites c/d/i. Most recenthgbwas8.on 10/16/2019(grossly stable fromlasthgb 8.5on8/04/2019). No family at bedside this AM.   Allergies: Patient has no known allergies.  Medications: Prior to Admission medications   Medication Sig Start Date End Date Taking? Authorizing Provider  amLODipine (NORVASC) 10 MG tablet Take 1 tablet (10 mg total) by mouth daily. 05/12/19  Yes Minette Brine, FNP  aspirin EC 81 MG tablet Take 81 mg by mouth every evening.   Yes [provider]  Calcium Carbonate-Vitamin D (CALCIUM-D PO) Take 2 tablets by mouth daily at 12 noon.    Yes [provider]  carvedilol (COREG) 6.25 MG tablet TAKE 1 TABLET(6.25 MG) BY MOUTH TWICE DAILY Patient taking differently: Take 6.25 mg by mouth 2 (two) times daily with a meal.  07/31/19  Yes Minette Brine, FNP  hydrALAZINE (APRESOLINE) 50 MG tablet Take 1 tablet (50 mg total) by mouth 2 (two) times daily. 06/16/19 09/14/19 Yes Elouise Munroe, MD  Insulin Degludec-Liraglutide (XULTOPHY) 100-3.6 UNIT-MG/ML SOPN Inject 30 Units into the skin daily. 08/14/19  Yes Minette Brine, FNP  telmisartan-hydrochlorothiazide (MICARDIS HCT) 40-12.5 MG tablet Take 1 tablet by mouth daily. 07/31/19  Yes Minette Brine, FNP  ticagrelor (BRILINTA) 90 MG TABS tablet Take 1 tablet (90 mg total) by mouth 2  (two) times daily. 06/12/19  Yes Elouise Munroe, MD  vitamin C (ASCORBIC ACID) 250 MG tablet Take 500 mg by mouth daily at 12 noon.   Yes [provider]  blood glucose meter kit and supplies KIT Dispense based on patient and insurance preference. Use up to four times daily as directed. (FOR ICD-9 250.00, 250.01). 05/10/18   Minette Brine, FNP  Blood Glucose Monitoring Suppl (TRUE METRIX METER) w/Device KIT 1 each by Does not apply route in the morning, at noon, in the evening, and at bedtime. 05/22/19   Minette Brine, FNP  Evolocumab (REPATHA SURECLICK) 696 MG/ML SOAJ Inject 140 mg into the skin every 14 (fourteen) days. 06/30/19   Elouise Munroe, MD  glucose blood (TRUE METRIX BLOOD GLUCOSE TEST) test strip Check blood sugar 4 times a day before meals and bedtime 05/22/19   Minette Brine, FNP  Insulin Glargine-Lixisenatide (SOLIQUA) 100-33 UNT-MCG/ML SOPN Inject 25 Units into the skin daily. Patient taking differently: Inject 30 Units into the skin daily.  07/14/19   Minette Brine, FNP  Multiple Vitamin (MULTIVITAMIN PO) Take 1 tablet by mouth daily at 12 noon.     [provider]  Omega-3 Fatty Acids (OMEGA-3 PLUS PO) Take 2 capsules by mouth daily at 12 noon.    [provider]     Vital Signs: BP (!) 161/62 (BP Location: Left Arm)   Pulse 98   Temp 98.5 F (36.9 C) (Oral)   Resp 15   Ht 5' 1"  (1.549 m)   Wt 156 lb 12 oz (71.1 kg)   SpO2 97%   BMI 29.62 kg/m   Physical Exam Vitals and nursing note reviewed.  Constitutional:      General: She is  not in acute distress.    Appearance: Normal appearance.     Comments: Tracheostomy without sedation, passy muir in place.  Pulmonary:     Effort: Pulmonary effort is normal. No respiratory distress.     Comments: Tracheostomy without sedation, passy muir in place. Skin:    General: Skin is warm and dry.     Comments: Right groin puncture site soft without active bleeding or hematoma. Right radial puncture  site soft without active bleeding or hematoma.    Neurological:     Mental Status: She is alert.     Comments: Tracheostomy without sedation. Passy muir in place- speech clear. Follows simple commands. PERRL bilaterally. Follows commands with all extremities (right side moving spontaneously, bends LUE at elbow and lifts/holds off bed, can bend/straighten LLE).        Imaging: No results found.  Labs:  CBC: Recent Labs    10/09/19 0731 10/10/19 0507 10/13/19 0614 10/16/19 0400  WBC 4.8 5.2 4.2 5.5  HGB 7.6* 8.4* 8.5* 8.6*  HCT 27.4* 29.0* 29.5* 29.7*  PLT 274 307 310 324    COAGS: Recent Labs    11/27/18 0728 08/18/19 0705 08/26/19 1715 08/27/19 0230  INR 0.9 0.9 1.1 1.2    BMP: Recent Labs    10/06/19 1155 10/10/19 0507 10/13/19 0614 10/16/19 0400  NA 143 141 138 138  K 3.8 3.3* 3.5 4.2  CL 103 104 103 99  CO2 29 29 32 30  GLUCOSE 223* 145* 153* 224*  BUN 16 <5* 8 13  CALCIUM 9.2 9.1 8.6* 9.1  CREATININE 0.71 0.55 0.48 0.68  GFRNONAA >60 >60 >60 >60  GFRAA >60 >60 >60 >60    LIVER FUNCTION TESTS: Recent Labs    09/17/19 0703 09/17/19 0703 09/20/19 0500 09/21/19 0500 09/26/19 0537 10/06/19 1155  BILITOT 1.1  --  0.7 0.3 0.5  --   AST 32  --  27 31 31   --   ALT 23  --  29 36 42  --   ALKPHOS 82  --  95 81 57  --   PROT 6.6  --  7.4 7.1 5.5*  --   ALBUMIN 1.6*   < > 1.5* 1.6* 1.8* 2.5*   < > = values in this interval not displayed.    Assessment and Plan:  Right ICA cervical/petrous junction stenosis s/p revascularization using balloon angioplasty via right radial and right femoral approach6/14/2021by Dr. Estanislado Pandy. Mild-moderate right thigh (with some retroperitoneal component) hematoma. Acute blood loss anemia, Jehovah's Witness. Patient's conditionstable-tracheostomy withoutsedation,morealert today,speech clear with passy muir in place,follows simple commands,moving all extremities with weakness of left side. Right groinand  right radial puncture sitesstable, no signs of active bleeding. Hgbgrossly stable at8.6 on 10/16/2019-receiving supplementation per severe anemia protocol. For possible transfer toSNF,awaitingfamily decision regarding SNF placement. Continue to hold Brilinta/Aspirin/Lovenox.Agree withTRHplans toavoid excess blood drawl. Further plans perTRH- appreciate and agree with management. NIR to follow.   Electronically Signed: Earley Abide, PA-C 10/21/2019, 11:29 AM   I spent a total of 25 Minutes at the the patient's bedside AND on the patient's hospital floor or unit, greater than 50% of which was counseling/coordinating care for right ICA stenosis s/p revascularization.

## 2019-10-21 NOTE — Progress Notes (Signed)
PROGRESS NOTE    Donna [name]  LKG:[id number] DOB: 04-04-1950 DOA: 08/25/2019 PCP: Minette Brine, FNP      Brief Narrative:  Donna [name] is a 69 y.o. F with hx HTN, CVA, residual left hemiparesis, DM, anemia, obesity, and cerebrovascular disease admitted mid-June for right ICA petrous/cervical junction angioplasty and stent due to symptomatic severe intracranial stenosis but post-operatively, developed confusion, worsening left hemiparesis night after procedure.  Medicine and Neurology were consulted, patient noted to have had hypotension and new anemia, and subsequent MRI showed evidence of watershed infarcts.  CT abdomen showed small RP hematoma.  She was transferred to the ICU on neosynephrine.  Patient stabilized until around 6/19 when she was intubated due to pulmonary edema and severe encephalopathy.  Failed extubation 6/28, tracheostomy 7/3.  Subsequently stabilized, denied for LTAC.  7/25, patient again developed respiratory distress, CTA chest showed GGO in upper lobes, patient treated for aspiration pneumonia.  7/30 PEG placed.  See summary note from 8/3.  Patient presently awaiting transfer to SNF, family has agreed to placement at [facility].   Assessment & Plan:  Disposition Awaiting bed at [facility].   From Dr. Sabino Niemann note: Watershed infarct stroke, right MCA territory -Continue atorvastatin -Continue to hold aspirin, Brilinta, Lovenox   Acute metabolic encephalopathy due to massive stroke -Continue famotidine, Seroquel -Continue tube feeds  Chronic respiratory failure Weaned to room air.  Humidified air only for comfort.  Stable for discharge to SNF. -Pulmonary trach team following weekly -Continue Pulmicort, nebs  Anemia Previously received folate, B12, and Epogen -Continue iron -Hold aspirin, Brilinta -Avoid excess blood draws  Diabetes Glucoses at goal -Continue Lantus -Continue SS corrections  Hypertension Blood pressure still  above goal -Continue amlodipine, hydralazine   Disposition: Status is: Inpatient  Dispo: The patient is from: Home              Anticipated d/c is to: [place]              Anticipated d/c date is: When bed is available              Patient currently is medically stable to d/c. to a lower level of care if appropriate resources can be put in place   The patient was admitted to the hospitalist service after a massive stroke.  She is now requiring continued full nursing cares, and likely will for a considerable period of time.  She is making gradual improvement in cognition, physical strength, but still has substantial hemiparesis and needs cares with all ADLs  We are attempting to make placement as soon as possible and she is stable for transfer to a lower level of care but not safe for discharge to home.   MDM: The below labs and imaging reports were reviewed and summarized above.  Medication management as above.   DVT prophylaxis: Place and maintain sequential compression device Start: 09/26/19 1319  Code Status: FULL Family Communication: Sister updated two days ago   Subjective: Herself has no new complaints.  She shakes her head yes and no to my questions.  She states she is comfortable.  Denies being any pain.  Denies shortness of breath.  Objective: Vitals:   10/21/19 1315 10/21/19 1338 10/21/19 1550 10/21/19 1610  BP: (!) 153/62  (!) 154/62   Pulse:  (!) 102 99 (!) 103  Resp:  _0 Temp:   98.4 F (36.9 C)   TempSrc:   Oral   SpO2:  97%  Weight:      Height:        Intake/Output Summary (Last 24 hours) at 10/21/2019 1827 Last data filed at 10/21/2019 1423 Gross per 24 hour  Intake 916.67 ml  Output 1350 ml  Net -433.33 ml   Filed Weights   10/20/19 0333 10/20/19 2237 10/21/19 0129  Weight: 71.4 kg 71.1 kg 71.1 kg    Examination: General appearance: Patient sitting up in bed in no acute distress, communicates with yes no head movements.   Passy-Muir valve in place. HEENT: Tracheostomy in place, unremarkable, oropharynx moist, no oral lesions, hearing normal, no nasal deformity, discharge, or epistaxis Skin: No suspicious rashes or lesions on the face, neck, arms, back, or abdomen Cardiac: RRR, no murmurs, no lower extremity edema Respiratory: Normal respiratory rate and rhythm, lungs clear without rales or wheezes Abdomen: Abdomen soft no tenderness palpation or guarding, no ascites or distention MSK:  Neuro: Awake and makes eye contact, upper extremity extremely weak, legs flaccid, laconic, but able to speak with Passy-Muir valve       Data Reviewed: I have personally reviewed following labs and imaging studies:  CBC: Recent Labs  Lab 10/16/19 0400  WBC 5.5  HGB 8.6*  HCT 29.7*  MCV 100.3*  PLT 960   Basic Metabolic Panel: Recent Labs  Lab 10/16/19 0400  NA 138  K 4.2  CL 99  CO2 30  GLUCOSE 224*  BUN 13  CREATININE 0.68  CALCIUM 9.1   GFR: Estimated Creatinine Clearance: 60.7 mL/min (by C-G formula based on SCr of 0.68 mg/dL). Liver Function Tests: No results for input(s): AST, ALT, ALKPHOS, BILITOT, PROT, ALBUMIN in the last 168 hours. No results for input(s): LIPASE, AMYLASE in the last 168 hours. No results for input(s): AMMONIA in the last 168 hours. Coagulation Profile: No results for input(s): INR, PROTIME in the last 168 hours. Cardiac Enzymes: No results for input(s): CKTOTAL, CKMB, CKMBINDEX, TROPONINI in the last 168 hours. BNP (last 3 results) No results for input(s): PROBNP in the last 8760 hours. HbA1C: No results for input(s): HGBA1C in the last 72 hours. CBG: Recent Labs  Lab 10/20/19 2318 10/21/19 0304 10/21/19 0753 10/21/19 1216 10/21/19 1548  GLUCAP 139* 124* 98 139* 165*   Lipid Profile: No results for input(s): CHOL, HDL, LDLCALC, TRIG, CHOLHDL, LDLDIRECT in the last 72 hours. Thyroid Function Tests: No results for input(s): TSH, T4TOTAL, FREET4, T3FREE, THYROIDAB  in the last 72 hours. Anemia Panel: No results for input(s): VITAMINB12, FOLATE, FERRITIN, TIBC, IRON, RETICCTPCT in the last 72 hours. Urine analysis:    Component Value Date/Time   COLORURINE AMBER (A) 09/16/2019 0931   APPEARANCEUR HAZY (A) 09/16/2019 0931   LABSPEC 1.021 09/16/2019 0931   PHURINE 5.0 09/16/2019 0931   GLUCOSEU NEGATIVE 09/16/2019 0931   HGBUR NEGATIVE 09/16/2019 0931   BILIRUBINUR NEGATIVE 09/16/2019 0931   BILIRUBINUR negative 01/02/2018 1636   KETONESUR NEGATIVE 09/16/2019 0931   PROTEINUR 30 (A) 09/16/2019 0931   UROBILINOGEN 0.2 01/02/2018 1636   NITRITE NEGATIVE 09/16/2019 0931   LEUKOCYTESUR SMALL (A) 09/16/2019 0931   Sepsis Labs: _0 (procalcitonin:4,lacticacidven:4)  ) No results found for this or any previous visit (from the past 240 hour(s)).       Radiology Studies: No results found.      Scheduled Meds: . acetylcysteine  2 mL Nebulization TID  . amLODipine  10 mg Per Tube Daily  . atorvastatin  40 mg Per NG tube Daily  . budesonide (PULMICORT) nebulizer solution  0.5 mg Nebulization BID  . chlorhexidine gluconate (MEDLINE KIT)  15 mL Mouth Rinse BID  . Chlorhexidine Gluconate Cloth  6 each Topical Daily  . famotidine  20 mg Per Tube BID  . feeding supplement (JEVITY 1.5 CAL/FIBER)  1,000 mL Per Tube Q24H  . feeding supplement (PROSource TF)  45 mL Per Tube TID  . ferrous sulfate  220 mg Per Tube TID WC  . hydrALAZINE  100 mg Per Tube Q6H  . hydrocerin   Topical TID  . insulin aspart  0-15 Units Subcutaneous Q4H  . insulin aspart  4 Units Subcutaneous Q4H  . insulin glargine  15 Units Subcutaneous Daily  . ipratropium-albuterol  3 mL Nebulization TID  . mouth rinse  15 mL Mouth Rinse 10 times per day  . QUEtiapine  100 mg Per Tube QHS   Continuous Infusions: . sodium chloride Stopped (10/09/19 0900)     LOS: 57 days    Time spent: 25 minutes    Vashti Hey, MD [facility] 10/21/2019, 6:27  PM     Please page though [place] or Epic secure chat:  For [employer], Adult nurse

## 2019-10-21 NOTE — TOC Progression Note (Addendum)
Transition of Care [facility]) - Progression Note    Patient Details  Name: Donna [name] MRN: [id number] Date of Birth: Jan 11, 1951  [facility] CM/SW Contact  Lorri Frederick, LCSW Phone Number: 10/21/2019, 11:20 AM  Clinical Narrative:   CSW spoke with pt sister, Thelma Barge, who reports that the family has decided to choose [facility].   Called [facility]--they are not managing this pt.  Called Shela at [facility], who reports they are having call bell issue that may delay their admissions.  She asked CSW to call back this afternoon to confirm their status.    Expected Discharge Plan: Skilled Nursing Facility Barriers to Discharge: Continued Medical Work up  Expected Discharge Plan and Services Expected Discharge Plan: Skilled Nursing Facility                                               Social Determinants of Health (SDOH) Interventions    Readmission Risk Interventions No flowsheet data found.

## 2019-10-22 DIAGNOSIS — I6350 Cerebral infarction due to unspecified occlusion or stenosis of unspecified cerebral artery: Secondary | ICD-10-CM | POA: Diagnosis not present

## 2019-10-22 DIAGNOSIS — I6521 Occlusion and stenosis of right carotid artery: Secondary | ICD-10-CM | POA: Diagnosis not present

## 2019-10-22 DIAGNOSIS — L8995 Pressure ulcer of unspecified site, unstageable: Secondary | ICD-10-CM

## 2019-10-22 DIAGNOSIS — D62 Acute posthemorrhagic anemia: Secondary | ICD-10-CM | POA: Diagnosis not present

## 2019-10-22 DIAGNOSIS — J9601 Acute respiratory failure with hypoxia: Secondary | ICD-10-CM | POA: Diagnosis not present

## 2019-10-22 LAB — BASIC METABOLIC PANEL
Anion gap: 9 (ref 5–15)
BUN: 18 mg/dL (ref 8–23)
CO2: 30 mmol/L (ref 22–32)
Calcium: 9.9 mg/dL (ref 8.9–10.3)
Chloride: 101 mmol/L (ref 98–111)
Creatinine, Ser: 0.57 mg/dL (ref 0.44–1.00)
GFR calc Af Amer: >60 mL/min (ref >60)
GFR calc non Af Amer: >60 mL/min (ref >60)
Glucose, Bld: 190 mg/dL — ABNORMAL HIGH (ref 70–99)
Potassium: 3.6 mmol/L (ref 3.5–5.1)
Sodium: 140 mmol/L (ref 135–145)

## 2019-10-22 LAB — CBC
HCT: 32.3 % — ABNORMAL LOW (ref 36.0–46.0)
Hemoglobin: 9.5 g/dL — ABNORMAL LOW (ref 12.0–15.0)
MCH: 28.1 pg (ref 26.0–34.0)
MCHC: 29.4 g/dL — ABNORMAL LOW (ref 30.0–36.0)
MCV: 95.6 fL (ref 80.0–100.0)
Platelets: 319 K/uL (ref 150–400)
RBC: 3.38 MIL/uL — ABNORMAL LOW (ref 3.87–5.11)
RDW: 16.9 % — ABNORMAL HIGH (ref 11.5–15.5)
WBC: 6.4 K/uL (ref 4.0–10.5)
nRBC: 0 % (ref 0.0–0.2)

## 2019-10-22 LAB — GLUCOSE, CAPILLARY
Glucose-Capillary: 171 mg/dL — ABNORMAL HIGH (ref 70–99)
Glucose-Capillary: 193 mg/dL — ABNORMAL HIGH (ref 70–99)
Glucose-Capillary: 125 mg/dL — ABNORMAL HIGH (ref 70–99)
Glucose-Capillary: 81 mg/dL (ref 70–99)
Glucose-Capillary: 186 mg/dL — ABNORMAL HIGH (ref 70–99)
Glucose-Capillary: 167 mg/dL — ABNORMAL HIGH (ref 70–99)

## 2019-10-22 NOTE — Plan of Care (Signed)
  Problem: Education: Goal: Knowledge of General Education information will improve Description: Including pain rating scale, medication(s)/side effects and non-pharmacologic comfort measures Outcome: Progressing   Problem: Health Behavior/Discharge Planning: Goal: Ability to manage health-related needs will improve Outcome: Progressing   Problem: Clinical Measurements: Goal: Ability to maintain clinical measurements within normal limits will improve Outcome: Progressing Goal: Will remain free from infection Outcome: Progressing Goal: Diagnostic test results will improve Outcome: Progressing Goal: Respiratory complications will improve Outcome: Progressing Goal: Cardiovascular complication will be avoided Outcome: Progressing   Problem: Activity: Goal: Risk for activity intolerance will decrease Outcome: Progressing   Problem: Coping: Goal: Level of anxiety will decrease Outcome: Progressing   Problem: Elimination: Goal: Will not experience complications related to bowel motility Outcome: Progressing Goal: Will not experience complications related to urinary retention Outcome: Progressing   Problem: Pain Managment: Goal: General experience of comfort will improve Outcome: Progressing   Problem: Safety: Goal: Ability to remain free from injury will improve Outcome: Progressing   Problem: Skin Integrity: Goal: Risk for impaired skin integrity will decrease Outcome: Progressing   Problem: Cardiovascular: Goal: Vascular access site(s) Level 0-1 will be maintained Outcome: Progressing   Problem: Education: Goal: Knowledge of disease or condition will improve Outcome: Progressing Goal: Knowledge of secondary prevention will improve Outcome: Progressing Goal: Knowledge of patient specific risk factors addressed and post discharge goals established will improve Outcome: Progressing   Problem: Health Behavior/Discharge Planning: Goal: Ability to manage  health-related needs will improve Outcome: Progressing   Problem: Self-Care: Goal: Ability to participate in self-care as condition permits will improve Outcome: Progressing   Problem: Ischemic Stroke/TIA Tissue Perfusion: Goal: Complications of ischemic stroke/TIA will be minimized Outcome: Progressing   Problem: Activity: Goal: Ability to tolerate increased activity will improve Outcome: Progressing   Problem: Respiratory: Goal: Ability to maintain a clear airway and adequate ventilation will improve Outcome: Progressing   Problem: Role Relationship: Goal: Method of communication will improve Outcome: Progressing

## 2019-10-22 NOTE — Progress Notes (Signed)
PROGRESS NOTE  Donna [name] TSV:[id number] DOB: [birth year]   PCP: Minette Brine, FNP  Patient is from: home  DOA: 08/25/2019 LOS: 38  Brief Narrative / Interim history: 69 y.o. F with hx HTN, CVA, residual left hemiparesis, DM, anemia, obesity, and cerebrovascular disease admitted mid-June for right ICA petrous/cervical junction angioplasty and stent due to symptomatic severe intracranial stenosis but post-operatively, developed confusion, worsening left hemiparesis night after procedure.  Medicine and Neurology were consulted, patient noted to have had hypotension and new anemia, and subsequent MRI showed evidence of watershed infarcts.  CT abdomen showed small RP hematoma.  She was transferred to the ICU on neosynephrine.  Patient stabilized until around 6/19 when she was intubated due to pulmonary edema and severe encephalopathy.  Failed extubation 6/28, tracheostomy 7/3.  Subsequently stabilized, denied for LTAC.  7/25, patient again developed respiratory distress, CTA chest showed GGO in upper lobes, patient treated for aspiration pneumonia.  7/30 PEG placed.  Waiting on SNF bed.   Subjective: Seen and examined earlier this morning.  No major events overnight of this morning.  No complaints.  She denies pain, shortness of breath, GI or UTI symptoms.  Objective: Vitals:   10/22/19 1135 10/22/19 1306 10/22/19 1457 10/22/19 1600  BP:    (!) 169/80  Pulse:  99  97  Resp: (!) 22 (!) 22 20 18   Temp:    99.4 F (37.4 C)  TempSrc:    Oral  SpO2:    96%  Weight:      Height:        Intake/Output Summary (Last 24 hours) at 10/22/2019 1702 Last data filed at 10/22/2019 1636 Gross per 24 hour  Intake 180 ml  Output 1100 ml  Net -920 ml   Filed Weights   10/21/19 0129 10/21/19 2108 10/22/19 0229  Weight: 71.1 kg 70.8 kg 70.8 kg    Examination:  GENERAL: No apparent distress.  Nontoxic. HEENT: MMM.  Vision and hearing grossly intact.  NECK: Trach collar. RESP:   No IWOB.  Fair aeration bilaterally. CVS:  RRR. Heart sounds normal.  ABD/GI/GU: BS+. Abd soft, NTND.  PEG tube. MSK/EXT:  Moves extremities. No apparent deformity. No edema.  SKIN: no apparent skin lesion or wound NEURO: Awake, alert and oriented self, place and situation but not time.  Motor 3/5 in the left and 5/5 in the right PSYCH: Calm. Normal affect.  Procedures:  Right ICA junction balloon angioplasty Tracheostomy on 7/4 PEG tube placement on 7/30  Microbiology summarized: MRSA PCR negative. 6/18-blood cultures negative. 6/20-respiratory culture with few diphtheroids 7/4-respiratory culture with Morganella morganii 7/6-blood culture negative  Assessment & Plan: Acute on chronic respiratory failure with hypoxia: Failed trial of extubation due to upper airway stridor. Tracheostomy on 7/4> trach collar.  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  -No anticoagulation or antiplatelet given bleed as above  Acutemetabolicencephalopathy: Seems to have resolved. -Frequent reorientation and delirium precautions -Continue Seroquel  Dysphagia-failed multiple swallowing eval's. -Tube feed via PEG tube, placed on 7/30  Acute blood loss anemia ->Anemia of critical illness: Jehovah's Witness, no blood products including albumin. Hgb 6.6>>> 9.5.  Previously received Epogen, B12 and folate. -Continue iron -Continue holding antiplatelets and anticoagulants -Minimize blood draws  Controlled DM-2 with hyperglycemia: A1c 5.2%. Recent Labs  Lab 10/21/19 2320 10/22/19 0318 10/22/19 0735 10/22/19 1143 10/22/19 1600  GLUCAP 217* 171* 193* 125* 81  -Continue SSI and CBG monitoring -Discontinue scheduled NovoLog -Continue Lantus -Continue statin  Essential hypertension: BP within goal  range -Continue amlodipine and hydralazine  Debility/physical deconditioning -Continue PT/OT    Nutrition/dysphagia Body mass index is 29.49 kg/m. Nutrition Problem: Inadequate oral intake Etiology: inability to eat Signs/Symptoms: NPO status Interventions: Magic cup, Tube feeding, Hormel Shake    Pressure Injury 10/16/19 Sacrum Left;Right Stage 2 -  Partial thickness loss of dermis presenting as a shallow open injury with a red, pink wound bed without slough. Sacrum (Active)  10/16/19 2000  Location: Sacrum  Location Orientation: Left;Right  Staging: Stage 2 -  Partial thickness loss of dermis presenting as a shallow open injury with a red, pink wound bed without slough.  Wound Description (Comments): Sacrum  Present on Admission:      Pressure Injury 10/16/19 Buttocks Left;Upper Stage 2 -  Partial thickness loss of dermis presenting as a shallow open injury with a red, pink wound bed without slough. (Active)  10/16/19 2000  Location: Buttocks  Location Orientation: Left;Upper  Staging: Stage 2 -  Partial thickness loss of dermis presenting as a shallow open injury with a red, pink wound bed without slough.  Wound Description (Comments):   Present on Admission:      Pressure Injury 10/16/19 Buttocks Left;Medial Stage 2 -  Partial thickness loss of dermis presenting as a shallow open injury with a red, pink wound bed without slough. (Active)  10/16/19 2000  Location: Buttocks  Location Orientation: Left;Medial  Staging: Stage 2 -  Partial thickness loss of dermis presenting as a shallow open injury with a red, pink wound bed without slough.  Wound Description (Comments):   Present on Admission:    DVT prophylaxis:  Place and maintain sequential compression device Start: 09/26/19 1319  Code Status: Full code Family Communication: Patient and/or RN. Available if any question.  Status is: Inpatient  Remains inpatient appropriate because:Unsafe d/c plan   Dispo: The  patient is from: Home              Anticipated d/c is to: SNF              Anticipated d/c date is: 2 days              Patient currently is medically stable to d/c.       Consultants:  Neurology PCCM IR    Sch Meds:  Scheduled Meds: . acetylcysteine  2 mL Nebulization TID  . amLODipine  10 mg Per Tube Daily  . atorvastatin  40 mg Per NG tube Daily  . budesonide (PULMICORT) nebulizer solution  0.5 mg Nebulization BID  . chlorhexidine gluconate (MEDLINE KIT)  15 mL Mouth Rinse BID  . Chlorhexidine Gluconate Cloth  6 each Topical Daily  . famotidine  20 mg Per Tube BID  . feeding supplement (JEVITY 1.5 CAL/FIBER)  1,000 mL Per Tube Q24H  . feeding supplement (PROSource TF)  45 mL Per Tube TID  . ferrous sulfate  220 mg Per Tube TID WC  . hydrALAZINE  100 mg Per Tube Q6H  . hydrocerin   Topical TID  . insulin aspart  0-15 Units Subcutaneous Q4H  . insulin glargine  15 Units Subcutaneous Daily  . ipratropium-albuterol  3 mL Nebulization TID  . mouth rinse  15 mL Mouth Rinse 10 times per day  . QUEtiapine  100 mg Per Tube QHS   Continuous Infusions: . sodium chloride Stopped (10/09/19  0900)   PRN Meds:.acetaminophen **OR** acetaminophen (TYLENOL) oral liquid 160 mg/5 mL **OR** acetaminophen, albuterol, lip balm, oxyCODONE, Resource ThickenUp Clear  Antimicrobials: Anti-infectives (From admission, onward)   Start     Dose/Rate Route Frequency Ordered Stop   10/10/19 1357  ceFAZolin (ANCEF) 2-4 GM/100ML-% IVPB       Note to Pharmacy: Leak, Brandi   : cabinet override      10/10/19 1357 10/10/19 1410   10/10/19 0830  ceFAZolin (ANCEF) 2-4 GM/100ML-% IVPB       Note to Pharmacy: Domenick Bookbinder   : cabinet override      10/10/19 0830 10/10/19 2044   09/16/19 2130  vancomycin (VANCOCIN) IVPB 1000 mg/200 mL premix  Status:  Discontinued        1,000 mg 200 mL/hr over 60 Minutes Intravenous Every 12 hours 09/16/19 0839 09/18/19 0756   09/16/19 1630  piperacillin-tazobactam  (ZOSYN) IVPB 3.375 g        3.375 g 12.5 mL/hr over 240 Minutes Intravenous Every 8 hours 09/16/19 0835 09/23/19 1153   09/16/19 1000  ceFEPIme (MAXIPIME) 1 g in sodium chloride 0.9 % 100 mL IVPB  Status:  Discontinued        1 g 200 mL/hr over 30 Minutes Intravenous Every 12 hours 09/16/19 0756 09/16/19 0803   09/16/19 0845  piperacillin-tazobactam (ZOSYN) IVPB 3.375 g        3.375 g 100 mL/hr over 30 Minutes Intravenous  Once 09/16/19 0834 09/16/19 1007   09/16/19 0845  vancomycin (VANCOREADY) IVPB 1500 mg/300 mL        1,500 mg 150 mL/hr over 120 Minutes Intravenous  Once 09/16/19 0839 09/16/19 1248   09/16/19 0830  piperacillin-tazobactam (ZOSYN) IVPB 3.375 g  Status:  Discontinued        3.375 g 12.5 mL/hr over 240 Minutes Intravenous Every 8 hours 09/16/19 0803 09/16/19 0834   09/16/19 0800  vancomycin (VANCOCIN) IVPB 1000 mg/200 mL premix  Status:  Discontinued        1,000 mg 200 mL/hr over 60 Minutes Intravenous Every 12 hours 09/16/19 0756 09/16/19 0839   08/31/19 1400  Ampicillin-Sulbactam (UNASYN) 3 g in sodium chloride 0.9 % 100 mL IVPB  Status:  Discontinued        3 g 200 mL/hr over 30 Minutes Intravenous Every 6 hours 08/31/19 1357 09/02/19 0819   08/25/19 0723  ceFAZolin (ANCEF) IVPB 2g/100 mL premix        2 g 200 mL/hr over 30 Minutes Intravenous 60 min pre-op 08/25/19 0723 08/25/19 0930       I have personally reviewed the following labs and images: CBC: Recent Labs  Lab 10/16/19 0400 10/22/19 0412  WBC 5.5 6.4  HGB 8.6* 9.5*  HCT 29.7* 32.3*  MCV 100.3* 95.6  PLT 324 319   BMP &GFR Recent Labs  Lab 10/16/19 0400 10/22/19 0412  NA 138 140  K 4.2 3.6  CL 99 101  CO2 30 30  GLUCOSE 224* 190*  BUN 13 18  CREATININE 0.68 0.57  CALCIUM 9.1 9.9   Estimated Creatinine Clearance: 60.6 mL/min (by C-G formula based on SCr of 0.57 mg/dL). Liver & Pancreas: No results for input(s): AST, ALT, ALKPHOS, BILITOT, PROT, ALBUMIN in the last 168 hours. No  results for input(s): LIPASE, AMYLASE in the last 168 hours. No results for input(s): AMMONIA in the last 168 hours. Diabetic: No results for input(s): HGBA1C in the last 72 hours. Recent Labs  Lab 10/21/19 2320 10/22/19 9292  10/22/19 0735 10/22/19 1143 10/22/19 1600  GLUCAP 217* 171* 193* 125* 81   Cardiac Enzymes: No results for input(s): CKTOTAL, CKMB, CKMBINDEX, TROPONINI in the last 168 hours. No results for input(s): PROBNP in the last 8760 hours. Coagulation Profile: No results for input(s): INR, PROTIME in the last 168 hours. Thyroid Function Tests: No results for input(s): TSH, T4TOTAL, FREET4, T3FREE, THYROIDAB in the last 72 hours. Lipid Profile: No results for input(s): CHOL, HDL, LDLCALC, TRIG, CHOLHDL, LDLDIRECT in the last 72 hours. Anemia Panel: No results for input(s): VITAMINB12, FOLATE, FERRITIN, TIBC, IRON, RETICCTPCT in the last 72 hours. Urine analysis:    Component Value Date/Time   COLORURINE AMBER (A) 09/16/2019 0931   APPEARANCEUR HAZY (A) 09/16/2019 0931   LABSPEC 1.021 09/16/2019 0931   PHURINE 5.0 09/16/2019 0931   GLUCOSEU NEGATIVE 09/16/2019 0931   HGBUR NEGATIVE 09/16/2019 0931   BILIRUBINUR NEGATIVE 09/16/2019 0931   BILIRUBINUR negative 01/02/2018 1636   KETONESUR NEGATIVE 09/16/2019 0931   PROTEINUR 30 (A) 09/16/2019 0931   UROBILINOGEN 0.2 01/02/2018 1636   NITRITE NEGATIVE 09/16/2019 0931   LEUKOCYTESUR SMALL (A) 09/16/2019 0931   Sepsis Labs: Invalid input(s): PROCALCITONIN, [place]  Microbiology: No results found for this or any previous visit (from the past 240 hour(s)).  Radiology Studies: No results found.    Arine Foley T. [place]  If 7PM-7AM, please contact night-coverage [email or web address] Password [facility] 10/22/2019, 5:02 PM

## 2019-10-22 NOTE — Progress Notes (Signed)
Referring Physician(s): Melvenia Beam  Supervising Physician: Luanne Bras  Patient Status:  [facility] - In-pt  Chief Complaint: None  Subjective:  Right ICA cervical/petrous junction stenosis s/p revascularization using balloon angioplasty via right radial and right femoral approach6/14/2021by Dr. Estanislado Pandy. Mild-moderate right thigh (with some retroperitoneal component) hematoma. Acute blood loss anemia, Jehovah's Witness. Patientawakelaying in bed, tracheostomy in place,no sedation.Shefollowssimple commands. Passy muir in place- speech clear. Can move all extremities today. Right groin and right radial puncture sites c/d/i. Most recenthgbwas9.5 this AM(improving,lasthgb 8.6on8/07/2019). No family at bedside this AM.   Allergies: Patient has no known allergies.  Medications: Prior to Admission medications   Medication Sig Start Date End Date Taking? Authorizing Provider  amLODipine (NORVASC) 10 MG tablet Take 1 tablet (10 mg total) by mouth daily. 05/12/19  Yes Minette Brine, FNP  aspirin EC 81 MG tablet Take 81 mg by mouth every evening.   Yes [provider]  Calcium Carbonate-Vitamin D (CALCIUM-D PO) Take 2 tablets by mouth daily at 12 noon.    Yes [provider]  carvedilol (COREG) 6.25 MG tablet TAKE 1 TABLET(6.25 MG) BY MOUTH TWICE DAILY Patient taking differently: Take 6.25 mg by mouth 2 (two) times daily with a meal.  07/31/19  Yes Minette Brine, FNP  hydrALAZINE (APRESOLINE) 50 MG tablet Take 1 tablet (50 mg total) by mouth 2 (two) times daily. 06/16/19 09/14/19 Yes Elouise Munroe, MD  Insulin Degludec-Liraglutide (XULTOPHY) 100-3.6 UNIT-MG/ML SOPN Inject 30 Units into the skin daily. 08/14/19  Yes Minette Brine, FNP  telmisartan-hydrochlorothiazide (MICARDIS HCT) 40-12.5 MG tablet Take 1 tablet by mouth daily. 07/31/19  Yes Minette Brine, FNP  ticagrelor (BRILINTA) 90 MG TABS tablet Take 1 tablet (90 mg total) by mouth 2 (two) times  daily. 06/12/19  Yes Elouise Munroe, MD  vitamin C (ASCORBIC ACID) 250 MG tablet Take 500 mg by mouth daily at 12 noon.   Yes [provider]  blood glucose meter kit and supplies KIT Dispense based on patient and insurance preference. Use up to four times daily as directed. (FOR ICD-9 250.00, 250.01). 05/10/18   Minette Brine, FNP  Blood Glucose Monitoring Suppl (TRUE METRIX METER) w/Device KIT 1 each by Does not apply route in the morning, at noon, in the evening, and at bedtime. 05/22/19   Minette Brine, FNP  Evolocumab (REPATHA SURECLICK) 191 MG/ML SOAJ Inject 140 mg into the skin every 14 (fourteen) days. 06/30/19   Elouise Munroe, MD  glucose blood (TRUE METRIX BLOOD GLUCOSE TEST) test strip Check blood sugar 4 times a day before meals and bedtime 05/22/19   Minette Brine, FNP  Insulin Glargine-Lixisenatide (SOLIQUA) 100-33 UNT-MCG/ML SOPN Inject 25 Units into the skin daily. Patient taking differently: Inject 30 Units into the skin daily.  07/14/19   Minette Brine, FNP  Multiple Vitamin (MULTIVITAMIN PO) Take 1 tablet by mouth daily at 12 noon.     [provider]  Omega-3 Fatty Acids (OMEGA-3 PLUS PO) Take 2 capsules by mouth daily at 12 noon.    [provider]     Vital Signs: BP (!) 129/57   Pulse 98   Temp 99.1 F (37.3 C) (Oral)   Resp 20   Ht _0  (1.549 m)   Wt 156 lb 1.4 oz (70.8 kg)   SpO2 97%   BMI 29.49 kg/m   Physical Exam Vitals and nursing note reviewed.  Constitutional:      General: She is not in acute distress.  Comments: Tracheostomy without sedation.   Pulmonary:     Effort: Pulmonary effort is normal. No respiratory distress.     Comments: Tracheostomy without sedation.  Skin:    General: Skin is warm and dry.     Comments: Right groin puncture site soft without active bleeding or hematoma. Right radial puncture site soft without active bleeding or hematoma.   Neurological:     Mental Status: She is alert.     Comments:  Tracheostomy without sedation. Passy muir in place- speech clear. Follows simple commands. PERRL bilaterally. Follows commands with all extremities (right side moving spontaneously, bends LUE at elbow and lifts/holds off bed, can lift LLE off bed but cannot hold).     Imaging: No results found.  Labs:  CBC: Recent Labs    10/10/19 0507 10/13/19 0614 10/16/19 0400 10/22/19 0412  WBC 5.2 4.2 5.5 6.4  HGB 8.4* 8.5* 8.6* 9.5*  HCT 29.0* 29.5* 29.7* 32.3*  PLT 307 310 324 319    COAGS: Recent Labs    11/27/18 0728 08/18/19 0705 08/26/19 1715 08/27/19 0230  INR 0.9 0.9 1.1 1.2    BMP: Recent Labs    10/10/19 0507 10/13/19 0614 10/16/19 0400 10/22/19 0412  NA 141 138 138 140  K 3.3* 3.5 4.2 3.6  CL 104 103 99 101  CO2 29 32 30 30  GLUCOSE 145* 153* 224* 190*  BUN <5* _0 CALCIUM 9.1 8.6* 9.1 9.9  CREATININE 0.55 0.48 0.68 0.57  GFRNONAA >60 >60 >60 >60  GFRAA >60 >60 >60 >60    LIVER FUNCTION TESTS: Recent Labs    09/17/19 0703 09/17/19 0703 09/20/19 0500 09/21/19 0500 09/26/19 0537 10/06/19 1155  BILITOT 1.1  --  0.7 0.3 0.5  --   AST 32  --  _1 --   ALT 23  --  29 36 42  --   ALKPHOS 82  --  95 81 57  --   PROT 6.6  --  7.4 7.1 5.5*  --   ALBUMIN 1.6*   < > 1.5* 1.6* 1.8* 2.5*   < > = values in this interval not displayed.    Assessment and Plan:  Right ICA cervical/petrous junction stenosis s/p revascularization using balloon angioplasty via right radial and right femoral approach6/14/2021by Dr. Estanislado Pandy. Mild-moderate right thigh (with some retroperitoneal component) hematoma. Acute blood loss anemia, Jehovah's Witness. Patient's conditionstable-tracheostomy withoutsedation,morealert today,speech clear with passy muir in place,follows simple commands,moving all extremities with weakness of left side. Right groinand right radial puncture sitesstable, no signs of active bleeding. Hgbimproving, is9.5today-receiving  supplementation per severe anemia protocol. For possible transfer toSNF,awaitingfamily decision regarding SNF placement. Continue to hold Brilinta/Aspirin/Lovenox.Agree withTRHplans toavoid excess blood drawl. Further plans perTRH- appreciate and agree with management. NIR will follow peripherally- please call NIR with questions/concerns.   Electronically Signed: Earley Abide, PA-C 10/22/2019, 10:44 AM   I spent a total of 15 Minutes at the the patient's bedside AND on the patient's hospital floor or unit, greater than 50% of which was counseling/coordinating care for right ICA stenosis s/p revascularization.

## 2019-10-22 NOTE — Progress Notes (Signed)
Physical Therapy Treatment Patient Details Name: Donna [name] MRN: [id number] DOB: 24-Jul-1950 Today's Date: 10/22/2019    History of Present Illness Pt is 69 y.o. female with PMH significant for HTN, HLD, CVA 06/2018, DM, anemia, obesity, OSA, and OA. She is known to [facility] and has been followed by Dr. Corliss Skains since 10/2018. Pt underwent R ICA angioplasty on 6/14. Pt with decline in neuro status and hemoglobin on 6/15, found to have a large R groin hematoma.  Pt has had Hbg 5.0 or less and is a Jehovah's witness.  She was intubated on 6/19. Pt underwent tracheostomy on 7/3. PT found to be septic with RLL PNA on 7/6. 7/13 trach tolerating extubation. Patient had a significant event on 7/25 resulting in respiratory distress, oxygen desaturation, and retroperitoneal hematomas in the right iliac/inguinal regions.  Had PEG placed on 10/10/19.  Currently awaiting SNF bed.    PT Comments    Pt demonstrating gradual progress.  Improved sitting balance and transfers today with mod A of 2.  Did require use of Stedy for pivot.  Pt requiring cues, positioning, and facilitation for balance and transfers.  Demonstrated increased use of L UE during transfers.  Still with strong L lean with sitting requiring min-mod A to correct.     Follow Up Recommendations  SNF     Equipment Recommendations  Wheelchair (measurements PT);Wheelchair cushion (measurements PT);Hospital bed    Recommendations for Other Services       Precautions / Restrictions Precautions Precautions: Fall Precaution Comments: trach, PEG, dense L hemiplegia, can now wear PMV with full supervision by staff Required Braces or Orthoses: Other Brace Other Brace: L resting hand splint, prevalon boots    Mobility  Bed Mobility Overal bed mobility: Needs Assistance Bed Mobility: Supine to Sit     Supine to sit: Mod assist     General bed mobility comments: Cues for rolling and positioning;facilitated with use of bed  pad  Transfers Overall transfer level: Needs assistance Equipment used: Ambulation equipment used Transfers: Sit to/from Stand Sit to Stand: +2 physical assistance;Mod assist         General transfer comment: Pt was able to grab Stedy bar with R UE, PT positioned L UE; with use of bed pad and gait belt stood with mod A x 2 for Stedy pad placment.  Ambulation/Gait                 Stairs             Wheelchair Mobility    Modified Rankin (Stroke Patients Only) Modified Rankin (Stroke Patients Only) Pre-Morbid Rankin Score: No symptoms Modified Rankin: Severe disability     Balance Overall balance assessment: Needs assistance Sitting-balance support: Single extremity supported;Feet supported Sitting balance-Leahy Scale: Poor Sitting balance - Comments: Sat EOB 5 mins with min-mod A for lateral lean; also worked at sitting edge of chair with L UE propt on armrest and weight shifting Postural control: Left lateral lean   Standing balance-Leahy Scale: Zero                              Cognition Arousal/Alertness: Awake/alert Behavior During Therapy: WFL for tasks assessed/performed Overall Cognitive Status: Impaired/Different from baseline Area of Impairment: Attention;Problem solving;Safety/judgement;Awareness                   Current Attention Level: Sustained   Following Commands: Follows one step commands with increased time Safety/Judgement: Decreased  awareness of safety;Decreased awareness of deficits Awareness: Intellectual Problem Solving: Slow processing;Requires verbal cues;Decreased initiation;Difficulty sequencing;Requires tactile cues General Comments: Pt with improving alertness and responses to therapists via nodding head and use of PMV      Exercises General Exercises - Lower Extremity Ankle Circles/Pumps: AROM;Right;AAROM;Left;10 reps;Supine Long Arc Quad: AAROM;Both;10 reps;Seated Other Exercises Other Exercises:  Pulling from reclined to upright position in chair x 5 withsupervision    General Comments        Pertinent Vitals/Pain Pain Assessment: No/denies pain    Home Living                      Prior Function            PT Goals (current goals can now be found in the care plan section) Acute Rehab PT Goals Patient Stated Goal: none stated PT Goal Formulation: Patient unable to participate in goal setting Time For Goal Achievement: 10/23/19 Potential to Achieve Goals: Fair Progress towards PT goals: Progressing toward goals    Frequency    Min 3X/week      PT Plan Current plan remains appropriate    Co-evaluation              AM-PAC PT "6 Clicks" Mobility   Outcome Measure  Help needed turning from your back to your side while in a flat bed without using bedrails?: Total Help needed moving from lying on your back to sitting on the side of a flat bed without using bedrails?: Total Help needed moving to and from a bed to a chair (including a wheelchair)?: Total Help needed standing up from a chair using your arms (e.g., wheelchair or bedside chair)?: Total Help needed to walk in hospital room?: Total Help needed climbing 3-5 steps with a railing? : Total 6 Click Score: 6    End of Session Equipment Utilized During Treatment: Gait belt Activity Tolerance: Patient tolerated treatment well Patient left: in chair;with chair alarm set;with call bell/phone within reach Nurse Communication: Mobility status;Need for lift equipment PT Visit Diagnosis: Other abnormalities of gait and mobility (R26.89);Muscle weakness (generalized) (M62.81);Other symptoms and signs involving the nervous system (R29.898);Hemiplegia and hemiparesis Hemiplegia - Right/Left: Left Hemiplegia - dominant/non-dominant: Non-dominant Hemiplegia - caused by: Cerebral infarction     Time: 7106-2694 PT Time Calculation (min) (ACUTE ONLY): 26 min  Charges:  $Therapeutic Activity: 8-22  mins $Neuromuscular Re-education: 8-22 mins                     Donna [name], PT Acute Rehab Services Pager [phone] [facility] [phone]     Donna [name] 10/22/2019, 6:08 PM

## 2019-10-23 DIAGNOSIS — I6521 Occlusion and stenosis of right carotid artery: Secondary | ICD-10-CM | POA: Diagnosis not present

## 2019-10-23 DIAGNOSIS — I6350 Cerebral infarction due to unspecified occlusion or stenosis of unspecified cerebral artery: Secondary | ICD-10-CM | POA: Diagnosis not present

## 2019-10-23 DIAGNOSIS — J9601 Acute respiratory failure with hypoxia: Secondary | ICD-10-CM | POA: Diagnosis not present

## 2019-10-23 DIAGNOSIS — D62 Acute posthemorrhagic anemia: Secondary | ICD-10-CM | POA: Diagnosis not present

## 2019-10-23 LAB — GLUCOSE, CAPILLARY
Glucose-Capillary: 160 mg/dL — ABNORMAL HIGH (ref 70–99)
Glucose-Capillary: 184 mg/dL — ABNORMAL HIGH (ref 70–99)
Glucose-Capillary: 202 mg/dL — ABNORMAL HIGH (ref 70–99)
Glucose-Capillary: 215 mg/dL — ABNORMAL HIGH (ref 70–99)
Glucose-Capillary: 226 mg/dL — ABNORMAL HIGH (ref 70–99)
Glucose-Capillary: 271 mg/dL — ABNORMAL HIGH (ref 70–99)

## 2019-10-23 MED ORDER — INSULIN ASPART 100 UNIT/ML ~~LOC~~ SOLN
3.0000 [IU] | SUBCUTANEOUS | Status: DC
  Administered 2019-10-23 – 2019-10-24 (×3): 3 [IU] via SUBCUTANEOUS

## 2019-10-23 MED ORDER — FERROUS SULFATE 300 (60 FE) MG/5ML PO SYRP
300.0000 mg | ORAL_SOLUTION | Freq: Three times a day (TID) | ORAL | Status: DC
  Administered 2019-10-23 – 2019-11-01 (×26): 300 mg
  Filled 2019-10-23 (×30): qty 5

## 2019-10-23 NOTE — Progress Notes (Signed)
Occupational Therapy Treatment Patient Details Name: Donna [name] MRN: [id number] DOB: 1950/10/19 Today's Date: 10/23/2019    History of present illness Pt is 69 y.o. female with PMH significant for HTN, HLD, CVA 06/2018, DM, anemia, obesity, OSA, and OA. She is known to [facility] and has been followed by Dr. Corliss Skains since 10/2018. Pt underwent R ICA angioplasty on 6/14. Pt with decline in neuro status and hemoglobin on 6/15, found to have a large R groin hematoma.  Pt has had Hbg 5.0 or less and is a Jehovah's witness.  She was intubated on 6/19. Pt underwent tracheostomy on 7/3. PT found to be septic with RLL PNA on 7/6. 7/13 trach tolerating extubation. Patient had a significant event on 7/25 resulting in respiratory distress, oxygen desaturation, and retroperitoneal hematomas in the right iliac/inguinal regions.  Had PEG placed on 10/10/19.  Currently awaiting SNF bed.   OT comments  Pt progressing well with OT goals with session focused on sitting balance and self feeding. Pt continues to require extensive assist for bed mobility, but demonstrated ability to maintain sitting balance sitting EOB Min A to Mod A depending on distractibility. Pt overall Min A for self feeding sitting EOB, improved ability to self feed when returned to 90* upright bed level. Pt with improved responses, demonstrated ability to answer orientation questions. Plan to instruct in self ROM HEP during next session.   Follow Up Recommendations  SNF    Equipment Recommendations  Wheelchair cushion (measurements OT);Wheelchair (measurements OT);Hospital bed;Other (comment)    Recommendations for Other Services      Precautions / Restrictions Precautions Precautions: Fall Precaution Comments: trach, PEG, dense L hemiplegia, can now wear PMV with full supervision by staff Required Braces or Orthoses: Other Brace Other Brace: L resting hand splint, prevalon boots Restrictions Weight Bearing Restrictions: No        Mobility Bed Mobility Overal bed mobility: Needs Assistance Bed Mobility: Supine to Sit;Sit to Supine     Supine to sit: Max assist;HOB elevated Sit to supine: Max assist;HOB elevated   General bed mobility comments: Max A for bed mobility, decreased initiation. With [facility] and use of tactile cues, improved participation  Transfers                      Balance Overall balance assessment: Needs assistance Sitting-balance support: Single extremity supported;Feet supported Sitting balance-Leahy Scale: Poor Sitting balance - Comments: Sat EOB for self feeding, requiring Min A to Mod A to maintain sitting balance with cues to correct Postural control: Left lateral lean                                 ADL either performed or assessed with clinical judgement   ADL Overall ADL's : Needs assistance/impaired Eating/Feeding: Minimal assistance;Sitting;Bed level Eating/Feeding Details (indicate cue type and reason): Min A overall for self feeding sitting EOB with L lateral lean. Improved with self feeding with sitting upright bed level.                 Lower Body Dressing: Total assistance;Bed level Lower Body Dressing Details (indicate cue type and reason): Total A to don socks bed level               General ADL Comments: Pt with improved interactions, able to self feed now with assistance.     Vision   Vision Assessment?: No apparent visual deficits  Perception     Praxis      Cognition Arousal/Alertness: Awake/alert Behavior During Therapy: WFL for tasks assessed/performed Overall Cognitive Status: Impaired/Different from baseline Area of Impairment: Problem solving;Safety/judgement;Awareness;Orientation;Attention                 Orientation Level: Time;Situation (did not know month) Current Attention Level: Sustained   Following Commands: Follows one step commands with increased time Safety/Judgement: Decreased awareness of  safety;Decreased awareness of deficits Awareness: Intellectual Problem Solving: Slow processing;Requires verbal cues;Decreased initiation;Difficulty sequencing;Requires tactile cues General Comments: Pt with improving alertness and responses to therapists via nodding head and use of PMV        Exercises     Shoulder Instructions       General Comments Session focused on self feeding and sitting balance EOB. Provided handout for self ROM exercises with plan to educate during next session    Pertinent Vitals/ Pain       Pain Assessment: No/denies pain  Home Living                                          Prior Functioning/Environment              Frequency  Min 2X/week        Progress Toward Goals  OT Goals(current goals can now be found in the care plan section)  Progress towards OT goals: Progressing toward goals  Acute Rehab OT Goals Patient Stated Goal: none stated OT Goal Formulation: Patient unable to participate in goal setting Time For Goal Achievement: 10/29/19 Potential to Achieve Goals: Fair ADL Goals Pt Will Perform Grooming: with mod assist;sitting Pt Will Perform Upper Body Bathing: with max assist;sitting Additional ADL Goal #1: Pt will tolerate splint wear Lt UE Additional ADL Goal #2: Pt to demonstrate ability to maintain static sitting balance EOB with no more than min guard for 10 minutes in order to improve endurance for ADLs Additional ADL Goal #3: Pt to scan and identify at least 3 ADL items in L visual field to improve safety and awareness during daily tasks.  Plan Discharge plan remains appropriate    Co-evaluation                 AM-PAC OT "6 Clicks" Daily Activity     Outcome Measure   Help from another person eating meals?: A Little Help from another person taking care of personal grooming?: A Lot Help from another person toileting, which includes using toliet, bedpan, or urinal?: Total Help from another  person bathing (including washing, rinsing, drying)?: Total Help from another person to put on and taking off regular upper body clothing?: Total Help from another person to put on and taking off regular lower body clothing?: Total 6 Click Score: 9    End of Session Equipment Utilized During Treatment: Oxygen  OT Visit Diagnosis: Muscle weakness (generalized) (M62.81);Cognitive communication deficit (R41.841);Hemiplegia and hemiparesis Symptoms and signs involving cognitive functions: Cerebral infarction Hemiplegia - Right/Left: Left Hemiplegia - dominant/non-dominant: Non-Dominant Hemiplegia - caused by: Cerebral infarction   Activity Tolerance Patient tolerated treatment well   Patient Left in bed;with call bell/phone within reach;with bed alarm set   Nurse Communication Mobility status;Other (comment) (self feeding)        Time: 3818-2993 OT Time Calculation (min): 36 min  Charges: OT General Charges $OT Visit: 1 Visit OT Treatments $Self Care/Home Management :  23-37 mins  Lorre Munroe, OTR/L   Lorre Munroe 10/23/2019, 3:40 PM

## 2019-10-23 NOTE — Plan of Care (Signed)
  Problem: Education: Goal: Knowledge of General Education information will improve Description: Including pain rating scale, medication(s)/side effects and non-pharmacologic comfort measures Outcome: Progressing   Problem: Health Behavior/Discharge Planning: Goal: Ability to manage health-related needs will improve Outcome: Progressing   Problem: Clinical Measurements: Goal: Ability to maintain clinical measurements within normal limits will improve Outcome: Progressing Goal: Will remain free from infection Outcome: Progressing Goal: Diagnostic test results will improve Outcome: Progressing Goal: Respiratory complications will improve Outcome: Progressing Goal: Cardiovascular complication will be avoided Outcome: Progressing   Problem: Activity: Goal: Risk for activity intolerance will decrease Outcome: Progressing   Problem: Coping: Goal: Level of anxiety will decrease Outcome: Progressing   Problem: Elimination: Goal: Will not experience complications related to bowel motility Outcome: Progressing Goal: Will not experience complications related to urinary retention Outcome: Progressing   Problem: Pain Managment: Goal: General experience of comfort will improve Outcome: Progressing   Problem: Safety: Goal: Ability to remain free from injury will improve Outcome: Progressing   Problem: Skin Integrity: Goal: Risk for impaired skin integrity will decrease Outcome: Progressing   Problem: Cardiovascular: Goal: Vascular access site(s) Level 0-1 will be maintained Outcome: Progressing   Problem: Education: Goal: Knowledge of disease or condition will improve Outcome: Progressing Goal: Knowledge of secondary prevention will improve Outcome: Progressing Goal: Knowledge of patient specific risk factors addressed and post discharge goals established will improve Outcome: Progressing   Problem: Health Behavior/Discharge Planning: Goal: Ability to manage  health-related needs will improve Outcome: Progressing   Problem: Self-Care: Goal: Ability to participate in self-care as condition permits will improve Outcome: Progressing   Problem: Ischemic Stroke/TIA Tissue Perfusion: Goal: Complications of ischemic stroke/TIA will be minimized Outcome: Progressing   Problem: Activity: Goal: Ability to tolerate increased activity will improve Outcome: Progressing   Problem: Respiratory: Goal: Ability to maintain a clear airway and adequate ventilation will improve Outcome: Progressing   Problem: Role Relationship: Goal: Method of communication will improve Outcome: Progressing   

## 2019-10-23 NOTE — Progress Notes (Signed)
PROGRESS NOTE  Donna [name] QJF:[id number] DOB: August 13, 1950   PCP: Minette Brine, FNP  Patient is from: home  DOA: 08/25/2019 LOS: 79  Brief Narrative / Interim history: 69 y.o. F with hx HTN, CVA, residual left hemiparesis, DM, anemia, obesity, and cerebrovascular disease admitted mid-June for right ICA petrous/cervical junction angioplasty and stent due to symptomatic severe intracranial stenosis but post-operatively, developed confusion, worsening left hemiparesis night after procedure.  Medicine and Neurology were consulted, patient noted to have had hypotension and new anemia, and subsequent MRI showed evidence of watershed infarcts.  CT abdomen showed small RP hematoma.  She was transferred to the ICU on neosynephrine.  Patient stabilized until around 6/19 when she was intubated due to pulmonary edema and severe encephalopathy.  Failed extubation 6/28, tracheostomy 7/3.  Subsequently stabilized, denied for LTAC.  7/25, patient again developed respiratory distress, CTA chest showed GGO in upper lobes, patient treated for aspiration pneumonia.  7/30 PEG placed.  Waiting on SNF bed.   Subjective: Seen and examined earlier this morning.  No major events overnight of this morning.  No complaints.  She denies pain, shortness of breath or abdominal pain.  Mittens over both hands.  Objective: Vitals:   10/23/19 0540 10/23/19 0815 10/23/19 0822 10/23/19 1118  BP: (!) 147/63  130/65   Pulse:  (!) 104  98  Resp:  20 18 18   Temp:   99.2 F (37.3 C)   TempSrc:   Oral   SpO2:  99%  99%  Weight:      Height:        Intake/Output Summary (Last 24 hours) at 10/23/2019 1423 Last data filed at 10/23/2019 0358 Gross per 24 hour  Intake 60 ml  Output 950 ml  Net -890 ml   Filed Weights   10/22/19 0229 10/22/19 2158 10/23/19 0401  Weight: 70.8 kg 70.5 kg 70.1 kg    Examination:  GENERAL: No apparent distress.  Nontoxic. HEENT: MMM.  Vision and hearing grossly intact.   NECK: Trach collar. RESP:  No IWOB.  Fair aeration bilaterally. CVS:  RRR. Heart sounds normal.  ABD/GI/GU: BS+. Abd soft, NTND.  MSK/EXT:  Moves extremities.  Mittens over both hands. SKIN: no apparent skin lesion or wound NEURO: Awake, alert and oriented to self, place and situation.  Motor 3/5 in left and 5/5 in right. PSYCH: Calm. Normal affect.   Procedures:  Right ICA junction balloon angioplasty Tracheostomy on 7/4 PEG tube placement on 7/30  Microbiology summarized: MRSA PCR negative. 6/18-blood cultures negative. 6/20-respiratory culture with few diphtheroids 7/4-respiratory culture with Morganella morganii 7/6-blood culture negative  Assessment & Plan: Acute on chronic respiratory failure with hypoxia: Failed trial of extubation due to upper airway stridor. Tracheostomy on 7/4> trach collar.  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  -No anticoagulation or antiplatelet given bleed as above  Acutemetabolicencephalopathy: Waxing and waning? -Frequent reorientation and delirium precautions -Continue Seroquel  Dysphagia-failed multiple swallowing eval's. -Tube feed via PEG tube, placed on 7/30  Acute blood loss anemia ->Anemia of critical illness: Jehovah's Witness, no blood products including albumin. Hgb 6.6>>> 9.5.  Previously received Epogen, B12 and folate. -Continue iron -Continue holding antiplatelets and anticoagulants -Minimize blood draws  Controlled DM-2 with hyperglycemia: A1c 5.2%. Recent Labs  Lab 10/22/19 1956 10/22/19 2320 10/23/19 0330 10/23/19 0825 10/23/19 1214  GLUCAP 186* 167* 226*  271* 215*  -Continue SSI every 4 and CBG monitoring -Added NovoLog 3 units every 4 hours -Continue Lantus -Continue statin  Essential hypertension: BP within goal  range -Continue amlodipine and hydralazine  Debility/physical deconditioning -Continue PT/OT    Nutrition/dysphagia Body mass index is 29.2 kg/m. Nutrition Problem: Inadequate oral intake Etiology: inability to eat Signs/Symptoms: NPO status Interventions: Magic cup, Tube feeding, Hormel Shake    Pressure Injury 10/16/19 Sacrum Left;Right Stage 2 -  Partial thickness loss of dermis presenting as a shallow open injury with a red, pink wound bed without slough. Sacrum (Active)  10/16/19 2000  Location: Sacrum  Location Orientation: Left;Right  Staging: Stage 2 -  Partial thickness loss of dermis presenting as a shallow open injury with a red, pink wound bed without slough.  Wound Description (Comments): Sacrum  Present on Admission:      Pressure Injury 10/16/19 Buttocks Left;Upper Stage 2 -  Partial thickness loss of dermis presenting as a shallow open injury with a red, pink wound bed without slough. (Active)  10/16/19 2000  Location: Buttocks  Location Orientation: Left;Upper  Staging: Stage 2 -  Partial thickness loss of dermis presenting as a shallow open injury with a red, pink wound bed without slough.  Wound Description (Comments):   Present on Admission:      Pressure Injury 10/16/19 Buttocks Left;Medial Stage 2 -  Partial thickness loss of dermis presenting as a shallow open injury with a red, pink wound bed without slough. (Active)  10/16/19 2000  Location: Buttocks  Location Orientation: Left;Medial  Staging: Stage 2 -  Partial thickness loss of dermis presenting as a shallow open injury with a red, pink wound bed without slough.  Wound Description (Comments):   Present on Admission:    DVT prophylaxis:  Place and maintain sequential compression device Start: 09/26/19 1319  Code Status: Full code Family Communication: Patient and/or RN. Available if any question.  Status is: Inpatient  Remains inpatient appropriate because:Unsafe d/c plan   Dispo: The  patient is from: Home              Anticipated d/c is to: SNF              Anticipated d/c date is: 2 days              Patient currently is medically stable to d/c.       Consultants:  Neurology PCCM IR    Sch Meds:  Scheduled Meds: . acetylcysteine  2 mL Nebulization TID  . amLODipine  10 mg Per Tube Daily  . atorvastatin  40 mg Per NG tube Daily  . budesonide (PULMICORT) nebulizer solution  0.5 mg Nebulization BID  . chlorhexidine gluconate (MEDLINE KIT)  15 mL Mouth Rinse BID  . Chlorhexidine Gluconate Cloth  6 each Topical Daily  . famotidine  20 mg Per Tube BID  . feeding supplement (JEVITY 1.5 CAL/FIBER)  1,000 mL Per Tube Q24H  . feeding supplement (PROSource TF)  45 mL Per Tube TID  . ferrous sulfate  300 mg Per Tube TID WC  . hydrALAZINE  100 mg Per Tube Q6H  . hydrocerin   Topical TID  . insulin aspart  0-15 Units Subcutaneous Q4H  . insulin glargine  15 Units Subcutaneous Daily  . ipratropium-albuterol  3 mL Nebulization TID  . mouth rinse  15 mL Mouth Rinse 10 times per day  . QUEtiapine  100 mg Per Tube QHS   Continuous Infusions: . sodium chloride  Stopped (10/09/19 0900)   PRN Meds:.acetaminophen **OR** acetaminophen (TYLENOL) oral liquid 160 mg/5 mL **OR** acetaminophen, albuterol, lip balm, oxyCODONE, Resource ThickenUp Clear  Antimicrobials: Anti-infectives (From admission, onward)   Start     Dose/Rate Route Frequency Ordered Stop   10/10/19 1357  ceFAZolin (ANCEF) 2-4 GM/100ML-% IVPB       Note to Pharmacy: Leak, Brandi   : cabinet override      10/10/19 1357 10/10/19 1410   10/10/19 0830  ceFAZolin (ANCEF) 2-4 GM/100ML-% IVPB       Note to Pharmacy: Domenick Bookbinder   : cabinet override      10/10/19 0830 10/10/19 2044   09/16/19 2130  vancomycin (VANCOCIN) IVPB 1000 mg/200 mL premix  Status:  Discontinued        1,000 mg 200 mL/hr over 60 Minutes Intravenous Every 12 hours 09/16/19 0839 09/18/19 0756   09/16/19 1630  piperacillin-tazobactam  (ZOSYN) IVPB 3.375 g        3.375 g 12.5 mL/hr over 240 Minutes Intravenous Every 8 hours 09/16/19 0835 09/23/19 1153   09/16/19 1000  ceFEPIme (MAXIPIME) 1 g in sodium chloride 0.9 % 100 mL IVPB  Status:  Discontinued        1 g 200 mL/hr over 30 Minutes Intravenous Every 12 hours 09/16/19 0756 09/16/19 0803   09/16/19 0845  piperacillin-tazobactam (ZOSYN) IVPB 3.375 g        3.375 g 100 mL/hr over 30 Minutes Intravenous  Once 09/16/19 0834 09/16/19 1007   09/16/19 0845  vancomycin (VANCOREADY) IVPB 1500 mg/300 mL        1,500 mg 150 mL/hr over 120 Minutes Intravenous  Once 09/16/19 0839 09/16/19 1248   09/16/19 0830  piperacillin-tazobactam (ZOSYN) IVPB 3.375 g  Status:  Discontinued        3.375 g 12.5 mL/hr over 240 Minutes Intravenous Every 8 hours 09/16/19 0803 09/16/19 0834   09/16/19 0800  vancomycin (VANCOCIN) IVPB 1000 mg/200 mL premix  Status:  Discontinued        1,000 mg 200 mL/hr over 60 Minutes Intravenous Every 12 hours 09/16/19 0756 09/16/19 0839   08/31/19 1400  Ampicillin-Sulbactam (UNASYN) 3 g in sodium chloride 0.9 % 100 mL IVPB  Status:  Discontinued        3 g 200 mL/hr over 30 Minutes Intravenous Every 6 hours 08/31/19 1357 09/02/19 0819   08/25/19 0723  ceFAZolin (ANCEF) IVPB 2g/100 mL premix        2 g 200 mL/hr over 30 Minutes Intravenous 60 min pre-op 08/25/19 0723 08/25/19 0930       I have personally reviewed the following labs and images: CBC: Recent Labs  Lab 10/22/19 0412  WBC 6.4  HGB 9.5*  HCT 32.3*  MCV 95.6  PLT 319   BMP &GFR Recent Labs  Lab 10/22/19 0412  NA 140  K 3.6  CL 101  CO2 30  GLUCOSE 190*  BUN 18  CREATININE 0.57  CALCIUM 9.9   Estimated Creatinine Clearance: 60.2 mL/min (by C-G formula based on SCr of 0.57 mg/dL). Liver & Pancreas: No results for input(s): AST, ALT, ALKPHOS, BILITOT, PROT, ALBUMIN in the last 168 hours. No results for input(s): LIPASE, AMYLASE in the last 168 hours. No results for input(s):  AMMONIA in the last 168 hours. Diabetic: No results for input(s): HGBA1C in the last 72 hours. Recent Labs  Lab 10/22/19 1956 10/22/19 2320 10/23/19 0330 10/23/19 0825 10/23/19 1214  GLUCAP 186* 167* 226* 271* 215*  Cardiac Enzymes: No results for input(s): CKTOTAL, CKMB, CKMBINDEX, TROPONINI in the last 168 hours. No results for input(s): PROBNP in the last 8760 hours. Coagulation Profile: No results for input(s): INR, PROTIME in the last 168 hours. Thyroid Function Tests: No results for input(s): TSH, T4TOTAL, FREET4, T3FREE, THYROIDAB in the last 72 hours. Lipid Profile: No results for input(s): CHOL, HDL, LDLCALC, TRIG, CHOLHDL, LDLDIRECT in the last 72 hours. Anemia Panel: No results for input(s): VITAMINB12, FOLATE, FERRITIN, TIBC, IRON, RETICCTPCT in the last 72 hours. Urine analysis:    Component Value Date/Time   COLORURINE AMBER (A) 09/16/2019 0931   APPEARANCEUR HAZY (A) 09/16/2019 0931   LABSPEC 1.021 09/16/2019 0931   PHURINE 5.0 09/16/2019 0931   GLUCOSEU NEGATIVE 09/16/2019 0931   HGBUR NEGATIVE 09/16/2019 0931   BILIRUBINUR NEGATIVE 09/16/2019 0931   BILIRUBINUR negative 01/02/2018 1636   KETONESUR NEGATIVE 09/16/2019 0931   PROTEINUR 30 (A) 09/16/2019 0931   UROBILINOGEN 0.2 01/02/2018 1636   NITRITE NEGATIVE 09/16/2019 0931   LEUKOCYTESUR SMALL (A) 09/16/2019 0931   Sepsis Labs: Invalid input(s): PROCALCITONIN, [place]  Microbiology: No results found for this or any previous visit (from the past 240 hour(s)).  Radiology Studies: No results found.    Dhanvin Szeto T. [place]  If 7PM-7AM, please contact night-coverage [email or web address] Password [facility] 10/23/2019, 2:23 PM

## 2019-10-24 DIAGNOSIS — I6350 Cerebral infarction due to unspecified occlusion or stenosis of unspecified cerebral artery: Secondary | ICD-10-CM | POA: Diagnosis not present

## 2019-10-24 DIAGNOSIS — J9601 Acute respiratory failure with hypoxia: Secondary | ICD-10-CM | POA: Diagnosis not present

## 2019-10-24 DIAGNOSIS — I6521 Occlusion and stenosis of right carotid artery: Secondary | ICD-10-CM | POA: Diagnosis not present

## 2019-10-24 DIAGNOSIS — D62 Acute posthemorrhagic anemia: Secondary | ICD-10-CM | POA: Diagnosis not present

## 2019-10-24 LAB — GLUCOSE, CAPILLARY
Glucose-Capillary: 139 mg/dL — ABNORMAL HIGH (ref 70–99)
Glucose-Capillary: 203 mg/dL — ABNORMAL HIGH (ref 70–99)
Glucose-Capillary: 222 mg/dL — ABNORMAL HIGH (ref 70–99)
Glucose-Capillary: 225 mg/dL — ABNORMAL HIGH (ref 70–99)
Glucose-Capillary: 233 mg/dL — ABNORMAL HIGH (ref 70–99)
Glucose-Capillary: 253 mg/dL — ABNORMAL HIGH (ref 70–99)

## 2019-10-24 MED ORDER — INSULIN ASPART 100 UNIT/ML ~~LOC~~ SOLN
5.0000 [IU] | SUBCUTANEOUS | Status: DC
  Administered 2019-10-24 – 2019-11-01 (×43): 5 [IU] via SUBCUTANEOUS

## 2019-10-24 MED ORDER — INSULIN GLARGINE 100 UNIT/ML ~~LOC~~ SOLN
20.0000 [IU] | Freq: Every day | SUBCUTANEOUS | Status: DC
  Administered 2019-10-24 – 2019-11-01 (×9): 20 [IU] via SUBCUTANEOUS
  Filled 2019-10-24 (×9): qty 0.2

## 2019-10-24 MED ORDER — IPRATROPIUM-ALBUTEROL 0.5-2.5 (3) MG/3ML IN SOLN: 3.0000 mL | RESPIRATORY_TRACT | Status: DC | PRN

## 2019-10-24 NOTE — TOC Progression Note (Signed)
Transition of Care [facility]) - Progression Note    Patient Details  Name: Donna [name] MRN: [id number] Date of Birth: 08-Oct-1950  [facility]) CM/SW Contact  Lorri Frederick, LCSW Phone Number: 10/24/2019, 10:41 AM  Clinical Narrative:   Multiple efforts to contact Cheyenne Adas, Aram Beecham, who is covering admissions for Velna Hatchet currently.  [phone].  Aram Beecham reported in AM that she is waiting for call back from RN regarding this admission.  Unable to reach her at the end of the day.  Family updated on situation and asking that SNF providers who have not responded in the hub be contacted as they are not interested in the other current bed offers.    Expected Discharge Plan: Skilled Nursing Facility Barriers to Discharge: Continued Medical Work up  Expected Discharge Plan and Services Expected Discharge Plan: Skilled Nursing Facility                                               Social Determinants of Health (SDOH) Interventions    Readmission Risk Interventions No flowsheet data found.

## 2019-10-24 NOTE — Progress Notes (Signed)
Physical Therapy Treatment Patient Details Name: Donna [name] MRN: [id number] DOB: 01/21/1951 Today's Date: 10/24/2019    History of Present Illness Pt is 69 y.o. female with PMH significant for HTN, HLD, CVA 06/2018, DM, anemia, obesity, OSA, and OA. She is known to [facility] and has been followed by Dr. Corliss Skains since 10/2018. Pt underwent R ICA angioplasty on 6/14. Pt with decline in neuro status and hemoglobin on 6/15, found to have a large R groin hematoma.  Pt has had Hbg 5.0 or less and is a Jehovah's witness.  She was intubated on 6/19. Pt underwent tracheostomy on 7/3. PT found to be septic with RLL PNA on 7/6. 7/13 trach tolerating extubation. Patient had a significant event on 7/25 resulting in respiratory distress, oxygen desaturation, and retroperitoneal hematomas in the right iliac/inguinal regions.  Had PEG placed on 10/10/19.  Currently awaiting SNF bed.    PT Comments    Patient was received laying in bed, smiling and starting to talk to [place] when we walked in saying that she "wants to get up and out of this bed." More awake today but with poor attention and focus. Noted that her trach collar was not on her, when asked she said she took it off. Put trach collar back on her. Transferred supine to sit with MaxAx2. Continues to have strong left lateral and posterior lean in sitting, requires ModAx2 and cueing to remain upright. Patient stated she needed to use the bedpan, but could wait until after we try standing. 2 sit <> stand transfers at the Steady with MaxAx2. Needed significant support of LUE to grab bar and hold arm up. Could not extend her hips to stand upright. Noted patient had a bowel movement. Transferred to supine with MaxAx2. Contacted nurse tech to help [place] clean her up. Patient worked on rolling to R and L while getting patient cleaned with ModAx1. Required minimal cueing to L, but still needing physical assistance to support for LUE when rolling R. Repositioned in bed. Patient was  left laying in bed, all needs within reach, and bed alarm set.     Follow Up Recommendations  SNF     Equipment Recommendations  Wheelchair (measurements PT);Wheelchair cushion (measurements PT);Hospital bed    Recommendations for Other Services       Precautions / Restrictions Precautions Precautions: Fall Precaution Comments: trach, PEG, dense L hemiplegia, can now wear PMV Required Braces or Orthoses: Other Brace Other Brace: L resting hand splint, prevalon boots Restrictions Weight Bearing Restrictions: No    Mobility  Bed Mobility Overal bed mobility: Needs Assistance Bed Mobility: Rolling;Supine to Sit;Sit to Supine Rolling: Mod assist   Supine to sit: Max assist;+2 for physical assistance;HOB elevated Sit to supine: Max assist;+2 for physical assistance   General bed mobility comments: MaxAx2 for bed mobility, still needed some cueing, but less than before, improved participation  Transfers Overall transfer level: Needs assistance Equipment used: Ambulation equipment used Transfers: Sit to/from Stand Sit to Stand: +2 physical assistance;Max assist         General transfer comment: Pt was able to grab Stedy bar with R UE, PT positioned L UE; with use of bed pad and gait belt stood with maxA x 2  Ambulation/Gait                 Stairs             Wheelchair Mobility    Modified Rankin (Stroke Patients Only)  Balance Overall balance assessment: Needs assistance Sitting-balance support: Feet supported;Single extremity supported Sitting balance-Leahy Scale: Poor Sitting balance - Comments: Sat EOB for 5 mins requiring ModA to maintain siting balance and cues to correct posture. Postural control: Left lateral lean;Posterior lean Standing balance support: Bilateral upper extremity supported Standing balance-Leahy Scale: Zero Standing balance comment: Total A to maintain standing balance in Stedy with L lateral lean                             Cognition Arousal/Alertness: Awake/alert Behavior During Therapy: WFL for tasks assessed/performed Overall Cognitive Status: Impaired/Different from baseline Area of Impairment: Problem solving;Safety/judgement;Awareness;Orientation;Attention                 Orientation Level: Situation;Place;Person Current Attention Level: Sustained   Following Commands: Follows one step commands consistently;Follows multi-step commands with increased time;Follows multi-step commands inconsistently Safety/Judgement: Decreased awareness of safety;Decreased awareness of deficits Awareness: Intellectual Problem Solving: Slow processing;Requires verbal cues;Decreased initiation;Difficulty sequencing;Requires tactile cues General Comments: Pt with improving alertness and responses to therapists via nodding head and use of PMV      Exercises      General Comments        Pertinent Vitals/Pain Pain Assessment: No/denies pain Pain Intervention(s): Monitored during session    Home Living                      Prior Function            PT Goals (current goals can now be found in the care plan section) Acute Rehab PT Goals Patient Stated Goal: none stated PT Goal Formulation: Patient unable to participate in goal setting Time For Goal Achievement: 10/23/19 Potential to Achieve Goals: Good Progress towards PT goals: Progressing toward goals    Frequency    Min 3X/week      PT Plan Current plan remains appropriate    Co-evaluation              AM-PAC PT "6 Clicks" Mobility   Outcome Measure  Help needed turning from your back to your side while in a flat bed without using bedrails?: Total Help needed moving from lying on your back to sitting on the side of a flat bed without using bedrails?: Total Help needed moving to and from a bed to a chair (including a wheelchair)?: Total Help needed standing up from a chair using your arms (e.g., wheelchair  or bedside chair)?: Total Help needed to walk in hospital room?: Total Help needed climbing 3-5 steps with a railing? : Total 6 Click Score: 6    End of Session Equipment Utilized During Treatment: Gait belt Activity Tolerance: Patient tolerated treatment well Patient left: in bed;with call bell/phone within reach;with bed alarm set;with nursing/sitter in room Nurse Communication: Mobility status PT Visit Diagnosis: Other abnormalities of gait and mobility (R26.89);Muscle weakness (generalized) (M62.81);Other symptoms and signs involving the nervous system (R29.898);Hemiplegia and hemiparesis Hemiplegia - Right/Left: Left Hemiplegia - dominant/non-dominant: Non-dominant Hemiplegia - caused by: Cerebral infarction     Time:  -     Charges:                       Elisha Ponder, SPT, ATC

## 2019-10-24 NOTE — TOC Progression Note (Addendum)
Transition of Care [facility]) - Progression Note    Patient Details  Name: SHALEEN TALAMANTEZ MRN: [id number] Date of Birth: 1950/09/13  Transition of Care [facility]) CM/SW Contact  Lorri Frederick, LCSW Phone Number: 10/24/2019, 10:43 AM  Clinical Narrative:   Message left with Aram Beecham at [facility] requesting update on their bed offer. Other SNFs contacted: [facility], [facility], [facility] (declined due to trach), [facility], [facility], [facility], [facility], [facility].  1300: Spoke with Scarlette Calico, sister, regarding current status.  She would like to pursue the bed offer at [facility].  Spoke with Revonda Standard at [facility] can still offer the bed.  Spoke with Scarlette Calico, who has spoken with pt nephew Evaristo Bury.  He is requesting CSW call [facility].  [facility] is ALF only.  LM with [facility].  Also contacted [facility] in [place] and they are currently closed to admissions.  Spoke to [place] who is going to speak to nephew again about moving forward with [facility] bed.  1600: Scarlette Calico agrees to move forward with [facility] for [facility].  CSW also spoke to [employer] at [facility] who agreed to look at referral and information was faxed.    Expected Discharge Plan: Skilled Nursing Facility Barriers to Discharge: Continued Medical Work up  Expected Discharge Plan and Services Expected Discharge Plan: Skilled Nursing Facility                                               Social Determinants of Health (SDOH) Interventions    Readmission Risk Interventions No flowsheet data found.

## 2019-10-24 NOTE — Progress Notes (Signed)
PROGRESS NOTE  Donna [name] WER:[id number] DOB: December 06, 1950   PCP: Minette Brine, FNP  Patient is from: home  DOA: 08/25/2019 LOS: 27  Brief Narrative / Interim history: 69 y.o. F with hx HTN, CVA, residual left hemiparesis, DM, anemia, obesity, and cerebrovascular disease admitted mid-June for right ICA petrous/cervical junction angioplasty and stent due to symptomatic severe intracranial stenosis but post-operatively, developed confusion, worsening left hemiparesis night after procedure.  Medicine and Neurology were consulted, patient noted to have had hypotension and new anemia, and subsequent MRI showed evidence of watershed infarcts.  CT abdomen showed small RP hematoma.  She was transferred to the ICU on neosynephrine.  Patient stabilized until around 6/19 when she was intubated due to pulmonary edema and severe encephalopathy.  Failed extubation 6/28, tracheostomy 7/3.  Subsequently stabilized, denied for LTAC.  7/25, patient again developed respiratory distress, CTA chest showed GGO in upper lobes, patient treated for aspiration pneumonia.  7/30 PEG placed.  Waiting on SNF bed. Should be on LOS service on Monday   Subjective: Seen and examined earlier this morning.  No major events overnight or this morning.  No complaints.  Denies chest pain, shortness of breath or GI symptoms.  Objective: Vitals:   10/24/19 0350 10/24/19 0416 10/24/19 0800 10/24/19 1125  BP:      Pulse: (!) 103  (!) 102 (!) 102  Resp: 18  20 20   Temp:      TempSrc:      SpO2: 99%  98% 98%  Weight:  69.3 kg    Height:       No intake or output data in the 24 hours ending 10/24/19 1206 Filed Weights   10/22/19 2158 10/23/19 0401 10/24/19 0416  Weight: 70.5 kg 70.1 kg 69.3 kg    Examination:  GENERAL: No apparent distress.  Nontoxic. HEENT: MMM.  Vision and hearing grossly intact.  NECK: On trach collar. RESP:  No IWOB.  Fair aeration bilaterally. CVS:  RRR. Heart sounds normal.   ABD/GI/GU: BS+. Abd soft, NTND.  G-tube. MSK/EXT: No apparent deformity. No edema.  SKIN: no apparent skin lesion or wound NEURO: Awake, alert and oriented to self, place and situation.  Motor 3/5 in left and 5/5 in right. PSYCH: Calm. Normal affect.   Procedures:  Right ICA junction balloon angioplasty Tracheostomy on 7/4 PEG tube placement on 7/30  Microbiology summarized: MRSA PCR negative. 6/18-blood cultures negative. 6/20-respiratory culture with few diphtheroids 7/4-respiratory culture with Morganella morganii 7/6-blood culture negative  Assessment & Plan: Acute on chronic respiratory failure with hypoxia: Failed trial of extubation due to upper airway stridor. Tracheostomy on 7/4> trach collar.  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  -No anticoagulation or antiplatelet given bleed as above  Acutemetabolicencephalopathy: Waxing and waning? -Frequent reorientation and delirium precautions -Continue Seroquel  Dysphagia-failed multiple swallowing eval's. -Tube feed via PEG tube, placed on 7/30  Acute blood loss anemia ->Anemia of critical illness: Jehovah's Witness, no blood products including albumin. Hgb 6.6>>> 9.5.  Previously received Epogen, B12 and folate. -Continue iron -Continue holding antiplatelets and anticoagulants -Minimize blood draws  Controlled DM-2 with hyperglycemia: A1c 5.2%. Recent Labs  Lab 10/23/19 2030 10/23/19 2334 10/24/19 0340 10/24/19 0757 10/24/19 1159  GLUCAP 160* 184* 225* 233* 253*  -Continue SSI every 4 and CBG monitoring -Increased NovoLog from 3 to 5 units every 4 hours  -  Increase Lantus from 15 to 20 units daily -Continue statin  Essential hypertension: BP within goal range -Continue amlodipine and hydralazine  Debility/physical  deconditioning -Continue PT/OT    Nutrition/dysphagia Body mass index is 28.87 kg/m. Nutrition Problem: Inadequate oral intake Etiology: inability to eat Signs/Symptoms: NPO status Interventions: Magic cup, Tube feeding, Hormel Shake    Pressure Injury 10/16/19 Sacrum Left;Right Stage 2 -  Partial thickness loss of dermis presenting as a shallow open injury with a red, pink wound bed without slough. Sacrum (Active)  10/16/19 2000  Location: Sacrum  Location Orientation: Left;Right  Staging: Stage 2 -  Partial thickness loss of dermis presenting as a shallow open injury with a red, pink wound bed without slough.  Wound Description (Comments): Sacrum  Present on Admission:      Pressure Injury 10/16/19 Buttocks Left;Upper Stage 2 -  Partial thickness loss of dermis presenting as a shallow open injury with a red, pink wound bed without slough. (Active)  10/16/19 2000  Location: Buttocks  Location Orientation: Left;Upper  Staging: Stage 2 -  Partial thickness loss of dermis presenting as a shallow open injury with a red, pink wound bed without slough.  Wound Description (Comments):   Present on Admission:      Pressure Injury 10/16/19 Buttocks Left;Medial Stage 2 -  Partial thickness loss of dermis presenting as a shallow open injury with a red, pink wound bed without slough. (Active)  10/16/19 2000  Location: Buttocks  Location Orientation: Left;Medial  Staging: Stage 2 -  Partial thickness loss of dermis presenting as a shallow open injury with a red, pink wound bed without slough.  Wound Description (Comments):   Present on Admission:    DVT prophylaxis:  Place and maintain sequential compression device Start: 09/26/19 1319  Code Status: Full code Family Communication: Patient and/or RN. Available if any question.  Status is: Inpatient  Remains inpatient appropriate because:Unsafe d/c plan   Dispo: The patient is from: Home              Anticipated d/c is to: SNF               Anticipated d/c date is: > 3 days              Patient currently is medically stable to d/c.       Consultants:  Neurology-signed off PCCM-signed off IR    Sch Meds:  Scheduled Meds: . amLODipine  10 mg Per Tube Daily  . atorvastatin  40 mg Per NG tube Daily  . budesonide (PULMICORT) nebulizer solution  0.5 mg Nebulization BID  . chlorhexidine gluconate (MEDLINE KIT)  15 mL Mouth Rinse BID  . Chlorhexidine Gluconate Cloth  6 each Topical Daily  . famotidine  20 mg Per Tube BID  . feeding supplement (JEVITY 1.5 CAL/FIBER)  1,000 mL Per Tube Q24H  . feeding supplement (PROSource TF)  45 mL Per Tube TID  . ferrous sulfate  300 mg Per Tube TID WC  . hydrALAZINE  100 mg Per Tube Q6H  . hydrocerin   Topical TID  . insulin aspart  0-15 Units Subcutaneous Q4H  . insulin aspart  3 Units Subcutaneous Q4H  . insulin glargine  20 Units Subcutaneous Daily  . mouth rinse  15 mL Mouth Rinse 10 times per day  . QUEtiapine  100 mg Per Tube QHS   Continuous Infusions: . sodium chloride Stopped (10/09/19 0900)   PRN Meds:.acetaminophen **OR** acetaminophen (TYLENOL) oral liquid 160 mg/5 mL **  OR** acetaminophen, albuterol, ipratropium-albuterol, lip balm, oxyCODONE, Resource ThickenUp Clear  Antimicrobials: Anti-infectives (From admission, onward)   Start     Dose/Rate Route Frequency Ordered Stop   10/10/19 1357  ceFAZolin (ANCEF) 2-4 GM/100ML-% IVPB       Note to Pharmacy: Leak, Brandi   : cabinet override      10/10/19 1357 10/10/19 1410   10/10/19 0830  ceFAZolin (ANCEF) 2-4 GM/100ML-% IVPB       Note to Pharmacy: Domenick Bookbinder   : cabinet override      10/10/19 0830 10/10/19 2044   09/16/19 2130  vancomycin (VANCOCIN) IVPB 1000 mg/200 mL premix  Status:  Discontinued        1,000 mg 200 mL/hr over 60 Minutes Intravenous Every 12 hours 09/16/19 0839 09/18/19 0756   09/16/19 1630  piperacillin-tazobactam (ZOSYN) IVPB 3.375 g        3.375 g 12.5 mL/hr over 240 Minutes  Intravenous Every 8 hours 09/16/19 0835 09/23/19 1153   09/16/19 1000  ceFEPIme (MAXIPIME) 1 g in sodium chloride 0.9 % 100 mL IVPB  Status:  Discontinued        1 g 200 mL/hr over 30 Minutes Intravenous Every 12 hours 09/16/19 0756 09/16/19 0803   09/16/19 0845  piperacillin-tazobactam (ZOSYN) IVPB 3.375 g        3.375 g 100 mL/hr over 30 Minutes Intravenous  Once 09/16/19 0834 09/16/19 1007   09/16/19 0845  vancomycin (VANCOREADY) IVPB 1500 mg/300 mL        1,500 mg 150 mL/hr over 120 Minutes Intravenous  Once 09/16/19 0839 09/16/19 1248   09/16/19 0830  piperacillin-tazobactam (ZOSYN) IVPB 3.375 g  Status:  Discontinued        3.375 g 12.5 mL/hr over 240 Minutes Intravenous Every 8 hours 09/16/19 0803 09/16/19 0834   09/16/19 0800  vancomycin (VANCOCIN) IVPB 1000 mg/200 mL premix  Status:  Discontinued        1,000 mg 200 mL/hr over 60 Minutes Intravenous Every 12 hours 09/16/19 0756 09/16/19 0839   08/31/19 1400  Ampicillin-Sulbactam (UNASYN) 3 g in sodium chloride 0.9 % 100 mL IVPB  Status:  Discontinued        3 g 200 mL/hr over 30 Minutes Intravenous Every 6 hours 08/31/19 1357 09/02/19 0819   08/25/19 0723  ceFAZolin (ANCEF) IVPB 2g/100 mL premix        2 g 200 mL/hr over 30 Minutes Intravenous 60 min pre-op 08/25/19 0723 08/25/19 0930       I have personally reviewed the following labs and images: CBC: Recent Labs  Lab 10/22/19 0412  WBC 6.4  HGB 9.5*  HCT 32.3*  MCV 95.6  PLT 319   BMP &GFR Recent Labs  Lab 10/22/19 0412  NA 140  K 3.6  CL 101  CO2 30  GLUCOSE 190*  BUN 18  CREATININE 0.57  CALCIUM 9.9   Estimated Creatinine Clearance: 59.9 mL/min (by C-G formula based on SCr of 0.57 mg/dL). Liver & Pancreas: No results for input(s): AST, ALT, ALKPHOS, BILITOT, PROT, ALBUMIN in the last 168 hours. No results for input(s): LIPASE, AMYLASE in the last 168 hours. No results for input(s): AMMONIA in the last 168 hours. Diabetic: No results for input(s):  HGBA1C in the last 72 hours. Recent Labs  Lab 10/23/19 2030 10/23/19 2334 10/24/19 0340 10/24/19 0757 10/24/19 1159  GLUCAP 160* 184* 225* 233* 253*   Cardiac Enzymes: No results for input(s): CKTOTAL, CKMB, CKMBINDEX, TROPONINI in the last 168  hours. No results for input(s): PROBNP in the last 8760 hours. Coagulation Profile: No results for input(s): INR, PROTIME in the last 168 hours. Thyroid Function Tests: No results for input(s): TSH, T4TOTAL, FREET4, T3FREE, THYROIDAB in the last 72 hours. Lipid Profile: No results for input(s): CHOL, HDL, LDLCALC, TRIG, CHOLHDL, LDLDIRECT in the last 72 hours. Anemia Panel: No results for input(s): VITAMINB12, FOLATE, FERRITIN, TIBC, IRON, RETICCTPCT in the last 72 hours. Urine analysis:    Component Value Date/Time   COLORURINE AMBER (A) 09/16/2019 0931   APPEARANCEUR HAZY (A) 09/16/2019 0931   LABSPEC 1.021 09/16/2019 0931   PHURINE 5.0 09/16/2019 0931   GLUCOSEU NEGATIVE 09/16/2019 0931   HGBUR NEGATIVE 09/16/2019 0931   BILIRUBINUR NEGATIVE 09/16/2019 0931   BILIRUBINUR negative 01/02/2018 1636   KETONESUR NEGATIVE 09/16/2019 0931   PROTEINUR 30 (A) 09/16/2019 0931   UROBILINOGEN 0.2 01/02/2018 1636   NITRITE NEGATIVE 09/16/2019 0931   LEUKOCYTESUR SMALL (A) 09/16/2019 0931   Sepsis Labs: Invalid input(s): PROCALCITONIN, [place]  Microbiology: No results found for this or any previous visit (from the past 240 hour(s)).  Radiology Studies: No results found.    Kathryn Cosby T. [place]  If 7PM-7AM, please contact night-coverage [email or web address] Password [facility] 10/24/2019, 12:06 PM

## 2019-10-25 DIAGNOSIS — I6521 Occlusion and stenosis of right carotid artery: Secondary | ICD-10-CM | POA: Diagnosis not present

## 2019-10-25 LAB — GLUCOSE, CAPILLARY
Glucose-Capillary: 186 mg/dL — ABNORMAL HIGH (ref 70–99)
Glucose-Capillary: 200 mg/dL — ABNORMAL HIGH (ref 70–99)
Glucose-Capillary: 236 mg/dL — ABNORMAL HIGH (ref 70–99)
Glucose-Capillary: 108 mg/dL — ABNORMAL HIGH (ref 70–99)
Glucose-Capillary: 151 mg/dL — ABNORMAL HIGH (ref 70–99)
Glucose-Capillary: 147 mg/dL — ABNORMAL HIGH (ref 70–99)

## 2019-10-25 NOTE — Progress Notes (Signed)
Donna [name]  QJJ:[id number] DOB: 02-05-1951 DOA: 08/25/2019 PCP: Minette Brine, FNP    Brief Narrative:  69 year old with a history of HTN, CVA with residual left hemiparesis, DM, anemia, and obesity who was admitted to [facility] August 25, 2019 to undergo a right ICA petrous/cervical junction angioplasty with stent due to symptomatic severe intracranial stenosis. Postoperatively she developed confusion with worsening left hemiparesis due to hemorrhagic hypotension leading to watershed CVAs.  She initially required Neo-Synephrine with transferred to the ICU. She had to be intubated due to pulmonary edema with severe encephalopathy. She failed extubation 628 and ultimately underwent tracheostomy placement 7/3. Attempts were made to place her in an LTAC but she was denied. Her course was further complicated by the development of an aspiration pneumonia 7/25. 7/30 a PEG tube was placed.  She is presently felt to be stable for discharge and attempts are being made to identify a SNF bed.  Significant Events:  6/14 admit  7/4 tracheostomy 7/30 PEG tube placement  Subjective: Sitting up in bed comfortably.  Denies chest pain nausea vomiting or abdominal pain.  Antimicrobials:  None  DVT prophylaxis: SCDs  Assessment & Plan:  Acute on chronic hypoxic respiratory failure Required intubation with subsequent failed extubation and ultimately required tracheostomy 7/4 -presently stable on trach collar  Right MCA CVA with left hemiparesis Status post right ICA junction balloon angioplasty complicated by right thigh and retroperitoneal hematoma -avoiding anticoagulation/antiplatelet  Acute metabolic encephalopathy Mental status is waxing and waning -continue Seroquel  Dysphagia Due to CVAs -failed multiple swallowing evaluations-PEG tube placed 7/30  Anemia of acute blood loss with hemorrhagic shock/hypotension Shock state/hypotension resolved -patient refuses blood products as she is  a Jehovah's Witness -was dosed with Epogen G81 and folic acid -continues to be dosed with iron -avoid antiplatelet/anticoagulants  Recent Labs  Lab 10/22/19 0412  HGB 9.5*    DM2 A1c 5.2 -CBG well controlled  HTN Blood pressure controlled  Debility/physical deconditioning Requires SNF placement   Code Status: FULL CODE Family Communication:  Status is: Inpatient  Remains inpatient appropriate because:Unsafe d/c plan   Dispo: The patient is from: Home              Anticipated d/c is to: SNF              Anticipated d/c date is: 1 day              Patient currently is medically stable to d/c.   Consultants:  none  Objective: Blood pressure (!) 173/73, pulse (!) 108, temperature 99.1 F (37.3 C), temperature source Oral, resp. rate 19, height _0  (1.549 m), weight 69.3 kg, SpO2 98 %.  Intake/Output Summary (Last 24 hours) at 10/25/2019 1015 Last data filed at 10/25/2019 8563 Gross per 24 hour  Intake 958 ml  Output 1950 ml  Net -992 ml   Filed Weights   10/22/19 2158 10/23/19 0401 10/24/19 0416  Weight: 70.5 kg 70.1 kg 69.3 kg    Examination: General: No acute respiratory distress Lungs: Clear to auscultation bilaterally without wheezes or crackles Cardiovascular: Regular rate and rhythm without murmur gallop or rub normal S1 and S2 Abdomen: Nontender, nondistended, soft, bowel sounds positive, no rebound, no ascites, no appreciable mass Extremities: No significant cyanosis, clubbing, or edema bilateral lower extremities  CBC: Recent Labs  Lab 10/22/19 0412  WBC 6.4  HGB 9.5*  HCT 32.3*  MCV 95.6  PLT 149   Basic Metabolic Panel: Recent Labs  Lab 10/22/19  0412  NA 140  K 3.6  CL 101  CO2 30  GLUCOSE 190*  BUN 18  CREATININE 0.57  CALCIUM 9.9   GFR: Estimated Creatinine Clearance: 59.9 mL/min (by C-G formula based on SCr of 0.57 mg/dL).  Liver Function Tests: No results for input(s): AST, ALT, ALKPHOS, BILITOT, PROT, ALBUMIN in the last  168 hours. No results for input(s): LIPASE, AMYLASE in the last 168 hours. No results for input(s): AMMONIA in the last 168 hours.  Coagulation Profile: No results for input(s): INR, PROTIME in the last 168 hours.  Cardiac Enzymes: No results for input(s): CKTOTAL, CKMB, CKMBINDEX, TROPONINI in the last 168 hours.  HbA1C: Hemoglobin A1C  Date/Time Value Ref Range Status  10/12/2017 12:00 AM 9.7  Final   Hgb A1c MFr Bld  Date/Time Value Ref Range Status  10/03/2019 04:30 PM 5.2 4.8 - 5.6 % Final    Comment:    (NOTE) Pre diabetes:          5.7%-6.4%  Diabetes:              >6.4%  Glycemic control for   <7.0% adults with diabetes   05/22/2019 01:12 PM 11.2 (H) 4.8 - 5.6 % Final    Comment:             Prediabetes: 5.7 - 6.4          Diabetes: >6.4          Glycemic control for adults with diabetes: <7.0     CBG: Recent Labs  Lab 10/24/19 1659 10/24/19 1934 10/24/19 2354 10/25/19 0326 10/25/19 0802  GLUCAP 222* 203* 139* 186* 200*    No results found for this or any previous visit (from the past 240 hour(s)).   Scheduled Meds: . amLODipine  10 mg Per Tube Daily  . atorvastatin  40 mg Per NG tube Daily  . budesonide (PULMICORT) nebulizer solution  0.5 mg Nebulization BID  . chlorhexidine gluconate (MEDLINE KIT)  15 mL Mouth Rinse BID  . Chlorhexidine Gluconate Cloth  6 each Topical Daily  . famotidine  20 mg Per Tube BID  . feeding supplement (JEVITY 1.5 CAL/FIBER)  1,000 mL Per Tube Q24H  . feeding supplement (PROSource TF)  45 mL Per Tube TID  . ferrous sulfate  300 mg Per Tube TID WC  . hydrALAZINE  100 mg Per Tube Q6H  . hydrocerin   Topical TID  . insulin aspart  0-15 Units Subcutaneous Q4H  . insulin aspart  5 Units Subcutaneous Q4H  . insulin glargine  20 Units Subcutaneous Daily  . mouth rinse  15 mL Mouth Rinse 10 times per day  . QUEtiapine  100 mg Per Tube QHS   Continuous Infusions: . sodium chloride Stopped (10/09/19 0900)     LOS: 61  days   Cherene Altes, MD [facility] Office  [phone] Pager - Text Page per Amion  If 7PM-7AM, please contact night-coverage per Amion 10/25/2019, 10:15 AM

## 2019-10-26 DIAGNOSIS — D62 Acute posthemorrhagic anemia: Secondary | ICD-10-CM | POA: Diagnosis not present

## 2019-10-26 DIAGNOSIS — I6521 Occlusion and stenosis of right carotid artery: Secondary | ICD-10-CM | POA: Diagnosis not present

## 2019-10-26 LAB — GLUCOSE, CAPILLARY
Glucose-Capillary: 140 mg/dL — ABNORMAL HIGH (ref 70–99)
Glucose-Capillary: 149 mg/dL — ABNORMAL HIGH (ref 70–99)
Glucose-Capillary: 155 mg/dL — ABNORMAL HIGH (ref 70–99)
Glucose-Capillary: 168 mg/dL — ABNORMAL HIGH (ref 70–99)
Glucose-Capillary: 178 mg/dL — ABNORMAL HIGH (ref 70–99)
Glucose-Capillary: 188 mg/dL — ABNORMAL HIGH (ref 70–99)

## 2019-10-26 NOTE — Progress Notes (Signed)
Donna [name]  HAL:[id number] DOB: [birth year] DOA: 08/25/2019 PCP: Minette Brine, FNP    Brief Narrative:  69 year old with a history of HTN, CVA with residual left hemiparesis, DM, anemia, and obesity who was admitted to [facility] August 25, 2019 to undergo a right ICA petrous/cervical junction angioplasty with stent due to symptomatic severe intracranial stenosis. Postoperatively she developed confusion with worsening left hemiparesis due to hemorrhagic hypotension leading to watershed CVAs.  She initially required Neo-Synephrine with transfer to the ICU. She had to be intubated due to pulmonary edema with severe encephalopathy. She failed extubation 6/28 and ultimately underwent tracheostomy placement 7/3. Attempts were made to place her in an LTAC but she was denied. Her course was further complicated by the development of an aspiration pneumonia 7/25. 7/30 a PEG tube was placed.  She is presently felt to be stable for discharge and attempts are being made to identify a SNF bed.  Significant Events:  6/14 admit  7/4 tracheostomy 7/30 PEG tube placement  Subjective: Resting comfortably in bed with no complaints.  Antimicrobials:  None  DVT prophylaxis: SCDs  Assessment & Plan:  Acute on chronic hypoxic respiratory failure Required intubation with subsequent failed extubation and ultimately required tracheostomy 7/4 - presently stable on trach collar  Right MCA CVA with left hemiparesis Status post right ICA junction balloon angioplasty complicated by right thigh and retroperitoneal hematoma -avoiding anticoagulation/antiplatelet  Acute metabolic encephalopathy Mental status is waxing and waning -continue Seroquel  Dysphagia Due to CVAs -failed multiple swallowing evaluations - PEG tube placed 7/30  Anemia of acute blood loss with hemorrhagic shock/hypotension Shock state/hypotension resolved - patient refuses blood products as she is a Jehovah's Witness - was dosed with  Epogen B35 and folic acid - continues to be dosed with iron - avoid antiplatelet/anticoagulants  Recent Labs  Lab 10/22/19 0412  HGB 9.5*    DM2 A1c 5.2 -CBG well controlled  HTN Blood pressure controlled  Debility/physical deconditioning Requires SNF placement   Code Status: FULL CODE Family Communication:  Status is: Inpatient  Remains inpatient appropriate because:Unsafe d/c plan   Dispo: The patient is from: Home              Anticipated d/c is to: SNF              Anticipated d/c date is: 1 day              Patient currently is medically stable to d/c.   Consultants:  none  Objective: Blood pressure (!) 144/62, pulse 98, temperature 99.4 F (37.4 C), temperature source Oral, resp. rate 18, height '5\' 1"'$  (1.549 m), weight 70 kg, SpO2 98 %.  Intake/Output Summary (Last 24 hours) at 10/26/2019 1002 Last data filed at 10/26/2019 0900 Gross per 24 hour  Intake 1298 ml  Output --  Net 1298 ml   Filed Weights   10/23/19 0401 10/24/19 0416 10/26/19 0500  Weight: 70.1 kg 69.3 kg 70 kg    Examination: General: No acute respiratory distress Lungs: CTA B Cardiovascular: RRR w/o M Abdomen: NT/ND, soft, BS+, no rebound Extremities: No C/C/E B LE    CBC: Recent Labs  Lab 10/22/19 0412  WBC 6.4  HGB 9.5*  HCT 32.3*  MCV 95.6  PLT 329   Basic Metabolic Panel: Recent Labs  Lab 10/22/19 0412  NA 140  K 3.6  CL 101  CO2 30  GLUCOSE 190*  BUN 18  CREATININE 0.57  CALCIUM 9.9   GFR: Estimated  Creatinine Clearance: 60.2 mL/min (by C-G formula based on SCr of 0.57 mg/dL).  Liver Function Tests: No results for input(s): AST, ALT, ALKPHOS, BILITOT, PROT, ALBUMIN in the last 168 hours. No results for input(s): LIPASE, AMYLASE in the last 168 hours. No results for input(s): AMMONIA in the last 168 hours.  HbA1C: Hemoglobin A1C  Date/Time Value Ref Range Status  10/12/2017 12:00 AM 9.7  Final   Hgb A1c MFr Bld  Date/Time Value Ref Range Status   10/03/2019 04:30 PM 5.2 4.8 - 5.6 % Final    Comment:    (NOTE) Pre diabetes:          5.7%-6.4%  Diabetes:              >6.4%  Glycemic control for   <7.0% adults with diabetes   05/22/2019 01:12 PM 11.2 (H) 4.8 - 5.6 % Final    Comment:             Prediabetes: 5.7 - 6.4          Diabetes: >6.4          Glycemic control for adults with diabetes: <7.0     CBG: Recent Labs  Lab 10/25/19 1733 10/25/19 1954 10/25/19 2309 10/26/19 0336 10/26/19 0751  GLUCAP 108* 151* 147* 168* 178*     Scheduled Meds: . amLODipine  10 mg Per Tube Daily  . atorvastatin  40 mg Per NG tube Daily  . budesonide (PULMICORT) nebulizer solution  0.5 mg Nebulization BID  . chlorhexidine gluconate (MEDLINE KIT)  15 mL Mouth Rinse BID  . Chlorhexidine Gluconate Cloth  6 each Topical Daily  . famotidine  20 mg Per Tube BID  . feeding supplement (JEVITY 1.5 CAL/FIBER)  1,000 mL Per Tube Q24H  . feeding supplement (PROSource TF)  45 mL Per Tube TID  . ferrous sulfate  300 mg Per Tube TID WC  . hydrALAZINE  100 mg Per Tube Q6H  . hydrocerin   Topical TID  . insulin aspart  0-15 Units Subcutaneous Q4H  . insulin aspart  5 Units Subcutaneous Q4H  . insulin glargine  20 Units Subcutaneous Daily  . mouth rinse  15 mL Mouth Rinse 10 times per day  . QUEtiapine  100 mg Per Tube QHS      LOS: 32 days   Cherene Altes, MD [facility] Office  [phone] Pager - Text Page per Amion  If 7PM-7AM, please contact night-coverage per Amion 10/26/2019, 10:02 AM

## 2019-10-27 DIAGNOSIS — I6521 Occlusion and stenosis of right carotid artery: Secondary | ICD-10-CM | POA: Diagnosis not present

## 2019-10-27 LAB — CBC
HCT: 35 % — ABNORMAL LOW (ref 36.0–46.0)
Hemoglobin: 10.3 g/dL — ABNORMAL LOW (ref 12.0–15.0)
MCH: 28.1 pg (ref 26.0–34.0)
MCHC: 29.4 g/dL — ABNORMAL LOW (ref 30.0–36.0)
MCV: 95.6 fL (ref 80.0–100.0)
Platelets: 300 10*3/uL (ref 150–400)
RBC: 3.66 MIL/uL — ABNORMAL LOW (ref 3.87–5.11)
RDW: 16.6 % — ABNORMAL HIGH (ref 11.5–15.5)
WBC: 6.1 10*3/uL (ref 4.0–10.5)
nRBC: 0 % (ref 0.0–0.2)

## 2019-10-27 LAB — GLUCOSE, CAPILLARY
Glucose-Capillary: 147 mg/dL — ABNORMAL HIGH (ref 70–99)
Glucose-Capillary: 221 mg/dL — ABNORMAL HIGH (ref 70–99)
Glucose-Capillary: 183 mg/dL — ABNORMAL HIGH (ref 70–99)
Glucose-Capillary: 108 mg/dL — ABNORMAL HIGH (ref 70–99)
Glucose-Capillary: 144 mg/dL — ABNORMAL HIGH (ref 70–99)
Glucose-Capillary: 117 mg/dL — ABNORMAL HIGH (ref 70–99)

## 2019-10-27 NOTE — Progress Notes (Signed)
Physical Therapy Treatment Patient Details Name: Donna [name] MRN: [id number] DOB: 1950-07-05 Today's Date: 10/27/2019    History of Present Illness Pt is 69 y.o. female with PMH significant for HTN, HLD, CVA 06/2018, DM, anemia, obesity, OSA, and OA. She is known to [facility] and has been followed by Dr. Corliss Skains since 10/2018. Pt underwent R ICA angioplasty on 6/14. Pt with decline in neuro status and hemoglobin on 6/15, found to have a large R groin hematoma.  Pt has had Hbg 5.0 or less and is a Jehovah's witness.  She was intubated on 6/19. Pt underwent tracheostomy on 7/3. PT found to be septic with RLL PNA on 7/6. 7/13 trach tolerating extubation. Patient had a significant event on 7/25 resulting in respiratory distress, oxygen desaturation, and retroperitoneal hematomas in the right iliac/inguinal regions.  Had PEG placed on 10/10/19.  Currently awaiting SNF bed.    PT Comments    Patient was received laying up in bed with RUE restraint. Able to talk in short phrases today. Today's session focused on therapeutic exercises in the bed with a goal of activating her hemiplegic side. SLR's x 12, heel slides x 12, L gastroc/soleus stretch x 30 sec, L hamstring stretch x 30 sec, & UE D1 and D2 patterns bilaterally. Hemiparetic side required holding of the limb to eliminate force of gravity and aid in facilitation of movement. Demonstrated overflow into R side when attempting to move the LLE and LUE. Patient tolerated treatment well and has shown improved activation of her left side. She was left laying in bed, all needs within reach, and bed alarm set.     Follow Up Recommendations  SNF     Equipment Recommendations  Wheelchair (measurements PT);Wheelchair cushion (measurements PT);Hospital bed    Recommendations for Other Services       Precautions / Restrictions Precautions Precautions: None Precaution Comments: trach, PEG, dense L hemiplegia, PMV, RUE restraint Required Braces or Orthoses:  Other Brace Other Brace: L resting hand splint, prevalon boots Restrictions Weight Bearing Restrictions: No    Mobility  Bed Mobility               General bed mobility comments: Therapeutic Exercise in bed, mobility not assessed today.  Transfers                 General transfer comment: not assessed today  Ambulation/Gait             General Gait Details: not assessed   Stairs             Wheelchair Mobility    Modified Rankin (Stroke Patients Only)       Balance       Sitting balance - Comments: not assessed today       Standing balance comment: not assessed today                            Cognition Arousal/Alertness: Awake/alert Behavior During Therapy: WFL for tasks assessed/performed Overall Cognitive Status: Impaired/Different from baseline Area of Impairment: Problem solving;Safety/judgement;Awareness;Orientation;Attention                   Current Attention Level: Sustained   Following Commands: Follows one step commands consistently;Follows multi-step commands with increased time;Follows multi-step commands inconsistently Safety/Judgement: Decreased awareness of safety;Decreased awareness of deficits Awareness: Intellectual Problem Solving: Slow processing;Requires verbal cues;Decreased initiation;Difficulty sequencing;Requires tactile cues General Comments: Pt with improving alertness and responses to therapists via  nodding head and use of PMV      Exercises      General Comments        Pertinent Vitals/Pain Pain Assessment: No/denies pain Pain Score: 0-No pain Faces Pain Scale: No hurt Pain Intervention(s): Monitored during session;Limited activity within patient's tolerance    Home Living                      Prior Function            PT Goals (current goals can now be found in the care plan section) Acute Rehab PT Goals Patient Stated Goal: none stated PT Goal Formulation:  Patient unable to participate in goal setting Time For Goal Achievement: 11/10/19 Potential to Achieve Goals: Good Progress towards PT goals: Progressing toward goals    Frequency    Min 3X/week      PT Plan Current plan remains appropriate    Co-evaluation              AM-PAC PT "6 Clicks" Mobility   Outcome Measure  Help needed turning from your back to your side while in a flat bed without using bedrails?: Total Help needed moving from lying on your back to sitting on the side of a flat bed without using bedrails?: Total Help needed moving to and from a bed to a chair (including a wheelchair)?: Total Help needed standing up from a chair using your arms (e.g., wheelchair or bedside chair)?: Total Help needed to walk in hospital room?: Total Help needed climbing 3-5 steps with a railing? : Total 6 Click Score: 6    End of Session   Activity Tolerance: Patient tolerated treatment well Patient left: in bed;with call bell/phone within reach;with bed alarm set Nurse Communication: Mobility status PT Visit Diagnosis: Other abnormalities of gait and mobility (R26.89);Muscle weakness (generalized) (M62.81);Other symptoms and signs involving the nervous system (R29.898);Hemiplegia and hemiparesis Hemiplegia - Right/Left: Left Hemiplegia - dominant/non-dominant: Non-dominant Hemiplegia - caused by: Cerebral infarction     Time:  -     Charges:                        Elisha Ponder, SPT, ATC

## 2019-10-27 NOTE — Progress Notes (Signed)
NAME:  Donna [name], MRN:  [id number], DOB:  08/13/1950, LOS: 67 ADMISSION DATE:  08/25/2019, CONSULTATION DATE:  08/27/2019 REFERRING MD:  Dr. Cheral Marker, CHIEF COMPLAINT:  Hypotension/ ABLA  Brief History   69 year old female Jehovah witness,  with prior history of HTN, HLD, CVA (06/2018- right MCA territory stroke with residual mild left hemiparesis), multiple intracranial stenosis including anterior/ posterior circulation, DM, anemia, obesity, OSA, and arthritis.  Admitted by Neuro IR on 6/14 for cerebral angiogram s/p RT ICA cervical/ petrous junction balloon angioplasty for severe stenosis via right femoral approach.  Was placed on ASA/ brillinta.  Monitored in Neuro ICU.  Noted to have developed hypotension, increased left sided weakness and lethargy on 6/15 am with Hgb drop from 13.3 to 6.9 with new AKI.  Post MRI/ MRA showed scattered small acute infarcts along the right MCA/ watershed territory suspected to be resultant either post procedure vs hypoperfusion.    A CT abd/ pelvis was obtained given concern for retroperitoneal bleed which showed a mild to moderate amount of retroperitoneal hematoma in the right iliac and inguinal regiones with moderate size hematoma noted the the soft tissues anterior to the musculature of the right hip; no definite intrapertioneal hemorrhage. INR, PT, and platelets rechecked and were wnl.  Vascular surgery was consulted with plans to observe given bleeding was mostly into the muscle and tissue.  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  7/04 TRACHEOSTOMY 7/11 Pall care meeting -  full code. Patient feels less congested, ventilatory setting was adjusted to see if she can be liberated from ventilator, avoiding tissue hypoxemia 7/12 Afebrile since 7/8. + 22L since admit. RN feels patient volume overloaded.Does SBT dailly of varying duration few to several hours and then gets fatigued with tachypnea and tachycardia and agitation. Left sided dense hemiplegia continues. Periodically pruposeful on right side PRecedex continues but unsure if patient needs it/ Diarrhea + - on docusate 7/13 - On PSV via trach. Still unable to come off precedex gtt. ? Diarrhea btter after stopping docusate. No labs as part of blood conservation. Got lasix yesterday. Last dose zosyn today. Diarrhea + but improved after dc laxative. Needed prn for high BP last night. Trach sutures removed 8/09 Waiting on SNF placement   Consults:  NIR - primary Neurology (stroke has signed off 6/24) Vascular surgery  PCCM Palliative Care Hematology  Procedures:   ETT 6/19 > 6/23,  6/23 > Trach 7/4  Significant Diagnostic Tests:  6/14 cerebral angiogram >> s/p RT ICA cer/petrous junction balloon angioplasty for severe stenosis.   6/15 MRA/ MRI brain  > Scattered small acute infarcts along the right MCA/watershed territory. Solitary small acute infarct in the left parietal cortex and right cerebellum. Improved right ICA patency at the petrous segment. Known severe right M1 segment stenosis. 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  6/15 CT a/p wo contrast > Mild to moderate amount of retroperitoneal hematoma is noted in the right iliac and inguinal regions. Moderate size hematoma is noted in the soft tissues anterior to the musculature of the right hip. No definite intraperitoneal hemorrhage is noted. Moderate size fat containing periumbilical hernia. Aortic  Atherosclerosis.   Echo 6/16 > normal LVEF  6/29 MRI brain > Progressed size and confluence of ischemia throughout much of the right hemisphere  white matter since 08/26/2019. Associated petechial blood products but no malignant hemorrhagic transformation or mass Effect. There are also multiple new lacunar type infarcts elsewhere, including the brainstem and the left basal ganglia. Underlying chronic infarcts of the right basal ganglia and left Cerebellum.   Micro Data:  6/10 SARS 2 >> neg 6/14 MRSA PCR >> neg 6/20 trach asp >> few diptheroids/ corynebacterium species 6/18 BC x 2 >> neg 7/28 Trach asp >> morganella morganii >> S-ceftazidime   Antimicrobials:  6/14 cefazolin pre-op  6/20 unasyn >> 09/02/2019 7/6 vancomycin > 7/8 7/6 Zosyn >> 7/12 (Morganella)  Interim history/subjective:  No issues overnight.  Patient working with speech as well as PT and OT.  Waiting on rehab placement   Objective   Blood pressure (!) 150/91, pulse 98, temperature 98.1 F (36.7 C), temperature source Oral, resp. rate 20, height _0  (1.549 m), weight 70 kg, SpO2 97 %.    FiO2 (%):  [21 %] 21 %   Intake/Output Summary (Last 24 hours) at 10/27/2019 0912 Last data filed at 10/26/2019 1812 Gross per 24 hour  Intake --  Output 450 ml  Net -450 ml   Filed Weights   10/23/19 0401 10/24/19 0416 10/26/19 0500  Weight: 70.1 kg 69.3 kg 70 kg    Examination: General: Patient alert, cuffless trach in place, able to communicate with PMV HEENT: Mucous membranes moist, #6 cuffless trach in place, no significant secretions Neuro: Alert oriented following commands CV: S1-S2 regular rate rhythm no murmur PULM: Clear to auscultation bilaterally no wheeze GI: Soft, nontender nondistended Extremities: No significant edema Skin: No obvious rash  Resolved Hospital Problem list   Hypotension  AKI Sepsis VAP - Morganella s/p zosyn ending 09/23/19  Assessment & Plan:   Acute on chronic respiratory failure with hypoxia requiring mechanical ventilation.   Failed trial of extubation due to upper airway stridor. Cords visibly scarred on  reintubation. S/P trach 7/4 Hypoxia on 7/26 felt to be secondary to secretions. Plan: Continue to wean FiO2 as tolerated to maintain sats greater than 90% Would not recommend decannulation due to above prior history of vocal cord issues. If decannulation was considered would need ENT evaluation prior. Pulmonary hygiene continued I-S and mobilization Continue routine trach care Continue PMV and work with SLP. Pulmonary will continue to follow weekly for trach care needs.  Do not hesitate to call for any questions or concerns.  Right MCA CVA due to bilateral intracranial stenoses  -s/p RT ICA cer/petrous junction balloon angioplasty for severe stenosis c/b right thigh and retroperitoneal hematoma.  Acute Metabolic Encephalopathy She has had dramatic improvement since the last time I met her.   [facility] 10/27/2019 9:12 AM

## 2019-10-27 NOTE — Progress Notes (Signed)
Nutrition Follow-up  DOCUMENTATION CODES:   Not applicable  INTERVENTION:   Continue nocturnal tube feeding: - Jevity 1.5 @ 70 ml/hr x 14 hrs (6pm-8am) via PEG - ProSource TF 45 ml TID  Tube feeding regimen provides 1590 kcal, 96 grams of protein, and 745 ml of H2O. Meets 100% of needs.   NUTRITION DIAGNOSIS:   Inadequate oral intake related to inability to eat as evidenced by NPO status.  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  6/14 - s/p balloon angioplasty for severe stenosis, pt developed lethargy and increased L sided weakness found to have new watershed infarcts, started on vasopressors for cerebral perfusion 6/16 - cortrak placed, tip in stomach 6/17 - pt with increased agitation requiring increased O2 6/18 - pt pulled out Cortrak, Cortrak replaced 6/19 - Cortrak removed 6/20 - intubated 6/23 - Cortrak replaced (tip gastric per Cortrak team), extubated, later reintubated 7/3 - s/p trach  7/30- s/p PEG  Pt continues on DYS 2 diet with nectar thick liquids. Appetite improving. Last 6 meal completions charted as 25-90% (53% average). Continue nocturnal feedings via PEG until PO intake consistently meets 75%. Awaiting SNF placement.   Admission weight: 77.5 kg  Current weight: 70 kg   Medications: ferrous sulfate, SS novolog, lantus Labs: CBG 108-236  Diet Order:   Diet Order            DIET DYS 2 Room service appropriate? No; Fluid consistency: Nectar Thick  Diet effective now                 EDUCATION NEEDS:   Not  appropriate for education at this time  Skin:  Skin Assessment: Skin Integrity Issues: Stage II: sacrum, buttocks x2 Incisions: right groin  Last BM:  8/14  Height:   Ht Readings from Last 1 Encounters:  08/31/19 5\' 1"  (1.549 m)    Weight:   Wt Readings from Last 1 Encounters:  10/26/19 70 kg    Ideal Body Weight:  47.7 kg  BMI:  Body mass index is 29.16 kg/m.  Estimated Nutritional Needs:   Kcal:  1550-1750  Protein:  90-105 grams  Fluid:  >1.6 L/day  10/28/19 RD, LDN Clinical Nutrition Pager listed in AMION

## 2019-10-27 NOTE — Progress Notes (Signed)
Donna [name]  HQI:[id number] DOB: [birth year] DOA: 08/25/2019 PCP: Minette Brine, FNP    Brief Narrative:  69 year old with a history of HTN, CVA with residual left hemiparesis, DM, anemia, and obesity who was admitted to [facility] August 25, 2019 to undergo a right ICA petrous/cervical junction angioplasty with stent due to symptomatic severe intracranial stenosis. Postoperatively she developed confusion with worsening left hemiparesis due to hemorrhagic hypotension leading to watershed CVAs.  She initially required Neo-Synephrine with transfer to the ICU. She had to be intubated due to pulmonary edema with severe encephalopathy. She failed extubation 6/28 and ultimately underwent tracheostomy placement 7/3. Attempts were made to place her in an LTAC but she was denied. Her course was further complicated by the development of an aspiration pneumonia 7/25. 7/30 a PEG tube was placed.  She is presently felt to be stable for discharge and attempts are being made to identify a SNF bed.  Significant Events:  6/14 admit  7/4 tracheostomy 7/30 PEG tube placement  Subjective: Awaiting placement in SNF. No active acute medical issues today. Was examined by PCCM today.   Antimicrobials:  None  DVT prophylaxis: SCDs  Assessment & Plan:  Acute on chronic hypoxic respiratory failure Required intubation with subsequent failed extubation and ultimately required tracheostomy 7/4 - presently stable on trach collar  Right MCA CVA with left hemiparesis Status post right ICA junction balloon angioplasty complicated by right thigh and retroperitoneal hematoma - avoiding anticoagulation/antiplatelets  Acute metabolic encephalopathy Mental status is waxing and waning - continue Seroquel  Dysphagia Due to CVAs -failed multiple swallowing evaluations - PEG tube placed 7/30  Anemia of acute blood loss with hemorrhagic shock/hypotension Shock state/hypotension resolved - patient refuses blood  products as she is a Jehovah's Witness - was dosed with Epogen X32 and folic acid - continues to be dosed with iron - avoid antiplatelet/anticoagulants  Recent Labs  Lab 10/22/19 0412 10/27/19 0117  HGB 9.5* 10.3*    DM2 A1c 5.2 -CBG well controlled  HTN Blood pressure controlled  Debility/physical deconditioning Requires SNF placement   Code Status: FULL CODE Family Communication:  Status is: Inpatient  Remains inpatient appropriate because:Unsafe d/c plan   Dispo: The patient is from: Home              Anticipated d/c is to: SNF              Anticipated d/c date is: 1 day              Patient currently is medically stable to d/c.   Consultants:  none  Objective: Blood pressure (!) 150/91, pulse 98, temperature 98.1 F (36.7 C), temperature source Oral, resp. rate 20, height '5\' 1"'$  (1.549 m), weight 70 kg, SpO2 97 %.  Intake/Output Summary (Last 24 hours) at 10/27/2019 1038 Last data filed at 10/27/2019 0913 Gross per 24 hour  Intake --  Output 1075 ml  Net -1075 ml   Filed Weights   10/23/19 0401 10/24/19 0416 10/26/19 0500  Weight: 70.1 kg 69.3 kg 70 kg    Examination: Exam not indicated today   CBC: Recent Labs  Lab 10/22/19 0412 10/27/19 0117  WBC 6.4 6.1  HGB 9.5* 10.3*  HCT 32.3* 35.0*  MCV 95.6 95.6  PLT 319 440   Basic Metabolic Panel: Recent Labs  Lab 10/22/19 0412  NA 140  K 3.6  CL 101  CO2 30  GLUCOSE 190*  BUN 18  CREATININE 0.57  CALCIUM 9.9   GFR:  Estimated Creatinine Clearance: 60.2 mL/min (by C-G formula based on SCr of 0.57 mg/dL).  Liver Function Tests: No results for input(s): AST, ALT, ALKPHOS, BILITOT, PROT, ALBUMIN in the last 168 hours. No results for input(s): LIPASE, AMYLASE in the last 168 hours. No results for input(s): AMMONIA in the last 168 hours.  HbA1C: Hemoglobin A1C  Date/Time Value Ref Range Status  10/12/2017 12:00 AM 9.7  Final   Hgb A1c MFr Bld  Date/Time Value Ref Range Status   10/03/2019 04:30 PM 5.2 4.8 - 5.6 % Final    Comment:    (NOTE) Pre diabetes:          5.7%-6.4%  Diabetes:              >6.4%  Glycemic control for   <7.0% adults with diabetes   05/22/2019 01:12 PM 11.2 (H) 4.8 - 5.6 % Final    Comment:             Prediabetes: 5.7 - 6.4          Diabetes: >6.4          Glycemic control for adults with diabetes: <7.0     CBG: Recent Labs  Lab 10/26/19 1702 10/26/19 1920 10/26/19 2334 10/27/19 0242 10/27/19 0748  GLUCAP 155* 140* 149* 147* 221*     Scheduled Meds: . amLODipine  10 mg Per Tube Daily  . atorvastatin  40 mg Per NG tube Daily  . budesonide (PULMICORT) nebulizer solution  0.5 mg Nebulization BID  . chlorhexidine gluconate (MEDLINE KIT)  15 mL Mouth Rinse BID  . Chlorhexidine Gluconate Cloth  6 each Topical Daily  . famotidine  20 mg Per Tube BID  . feeding supplement (JEVITY 1.5 CAL/FIBER)  1,000 mL Per Tube Q24H  . feeding supplement (PROSource TF)  45 mL Per Tube TID  . ferrous sulfate  300 mg Per Tube TID WC  . hydrALAZINE  100 mg Per Tube Q6H  . hydrocerin   Topical TID  . insulin aspart  0-15 Units Subcutaneous Q4H  . insulin aspart  5 Units Subcutaneous Q4H  . insulin glargine  20 Units Subcutaneous Daily  . mouth rinse  15 mL Mouth Rinse 10 times per day  . QUEtiapine  100 mg Per Tube QHS      LOS: 70 days   Cherene Altes, MD [facility] Office  [phone] Pager - Text Page per Amion  If 7PM-7AM, please contact night-coverage per Amion 10/27/2019, 10:38 AM

## 2019-10-27 NOTE — TOC Progression Note (Signed)
Transition of Care [facility]) - Progression Note    Patient Details  Name: Donna [name] MRN: [id number] Date of Birth: 1950/04/16  Transition of Care [facility]) CM/SW Contact  Lorri Frederick, LCSW Phone Number: 10/27/2019, 3:26 PM  Clinical Narrative:   CSW spoke with Crystal at [facility] at 0830--she had not reviewed referral and said she would do so and call back.  Revonda Standard at [facility] is the other option and she asked to come by and meet with pt today.  CSW called back to [facility] at 1400 and was told Crystal had left at noon today.    Expected Discharge Plan: Skilled Nursing Facility Barriers to Discharge: Continued Medical Work up  Expected Discharge Plan and Services Expected Discharge Plan: Skilled Nursing Facility                                               Social Determinants of Health (SDOH) Interventions    Readmission Risk Interventions No flowsheet data found.

## 2019-10-28 LAB — GLUCOSE, CAPILLARY
Glucose-Capillary: 144 mg/dL — ABNORMAL HIGH (ref 70–99)
Glucose-Capillary: 153 mg/dL — ABNORMAL HIGH (ref 70–99)
Glucose-Capillary: 162 mg/dL — ABNORMAL HIGH (ref 70–99)
Glucose-Capillary: 167 mg/dL — ABNORMAL HIGH (ref 70–99)
Glucose-Capillary: 171 mg/dL — ABNORMAL HIGH (ref 70–99)
Glucose-Capillary: 253 mg/dL — ABNORMAL HIGH (ref 70–99)

## 2019-10-28 MED ORDER — QUETIAPINE FUMARATE 50 MG PO TABS
50.0000 mg | ORAL_TABLET | Freq: Every day | ORAL | Status: DC
  Administered 2019-10-28 – 2019-10-31 (×4): 50 mg
  Filled 2019-10-28 (×4): qty 1

## 2019-10-28 NOTE — Progress Notes (Signed)
Occupational Therapy Treatment Patient Details Name: Donna [name] MRN: [id number] DOB: [birth year] Today's Date: 10/28/2019    History of present illness Pt is 69 y.o. female with PMH significant for HTN, HLD, CVA 06/2018, DM, anemia, obesity, OSA, and OA. She is [facility] to [facility] and has been followed by Dr. Corliss Skains since 10/2018. Pt underwent R ICA angioplasty on 6/14. Pt with decline in neuro status and hemoglobin on 6/15, found to have a large R groin hematoma.  Pt has had Hbg 5.0 or less and is a Jehovah's witness.  She was intubated on 6/19. Pt underwent tracheostomy on 7/3. PT found to be septic with RLL PNA on 7/6. 7/13 trach tolerating extubation. Patient had a significant event on 7/25 resulting in respiratory distress, oxygen desaturation, and retroperitoneal hematomas in the right iliac/inguinal regions.  Had PEG placed on 10/10/19.  Currently awaiting SNF bed.   OT comments  Pt progressing with OT POC with goals updated accordingly. Pt demonstrates ability to self feed with Supervision using R hand. Encouraged pt to implement L UE into these tasks to assist in restoring function with [facility] provided to drink from cup. Instructed pt in self ROM exercises at all joints of L UE with reinforcement needed as pt with difficulty attending to this task. Plan to progress to further ADL transfer training during next session.   Follow Up Recommendations  SNF    Equipment Recommendations  Wheelchair cushion (measurements OT);Wheelchair (measurements OT);Hospital bed;Other (comment)    Recommendations for Other Services      Precautions / Restrictions Precautions Precautions: Fall Precaution Comments: trach, PEG, dense L hemiplegia, PMV Required Braces or Orthoses: Other Brace Other Brace: L resting hand splint, prevalon boots Restrictions Weight Bearing Restrictions: No       Mobility Bed Mobility Overal bed mobility: Needs Assistance Bed Mobility: Rolling Rolling: Min assist          General bed mobility comments: On OT entry, nursing staff assisting with peri care, Min A to rolling in bed  Transfers                      Balance                                           ADL either performed or assessed with clinical judgement   ADL Overall ADL's : Needs assistance/impaired Eating/Feeding: Supervision/ safety;Bed level Eating/Feeding Details (indicate cue type and reason): Supervision bed level for self feeding using R hand. Encouraged pt to incorporate L hand into assisting with tasks, required encouragement and HOH for B hands to lift cup to mouth                                   General ADL Comments: Pt with improved interactions, able to self feed with supervision using R hand     Vision   Vision Assessment?: No apparent visual deficits   Perception     Praxis      Cognition Arousal/Alertness: Awake/alert Behavior During Therapy: WFL for tasks assessed/performed Overall Cognitive Status: Impaired/Different from baseline Area of Impairment: Problem solving;Safety/judgement;Awareness;Orientation;Attention                 Orientation Level: Time;Situation Current Attention Level: Sustained   Following Commands: Follows one step commands consistently;Follows multi-step commands  with increased time;Follows multi-step commands inconsistently Safety/Judgement: Decreased awareness of safety;Decreased awareness of deficits Awareness: Intellectual Problem Solving: Slow processing;Requires verbal cues;Decreased initiation;Difficulty sequencing;Requires tactile cues General Comments: Pt with improving alertness and responses to therapists via nodding head and use of PMV        Exercises Exercises: General Upper Extremity General Exercises - Upper Extremity Shoulder Flexion: Self ROM;Left;5 reps Elbow Flexion: Self ROM;Left;5 reps Elbow Extension: Self ROM;Left;5 reps Wrist Flexion: Self ROM;Left;5  reps Wrist Extension: Self ROM;Left;5 reps Digit Composite Flexion: Self ROM;Both;5 reps Composite Extension: Self ROM;Left;5 reps   Shoulder Instructions       General Comments Pt with decreased attention to self ROM exercises, but attentive to normal conversation    Pertinent Vitals/ Pain       Pain Assessment: Faces Faces Pain Scale: No hurt Pain Location: left leg at end of session Pain Descriptors / Indicators: Grimacing Pain Intervention(s): Monitored during session  Home Living                                          Prior Functioning/Environment              Frequency  Min 2X/week        Progress Toward Goals  OT Goals(current goals can now be found in the care plan section)  Progress towards OT goals: Progressing toward goals  Acute Rehab OT Goals Patient Stated Goal: none stated OT Goal Formulation: With patient Time For Goal Achievement: 11/11/19 Potential to Achieve Goals: Fair ADL Goals Pt Will Perform Grooming: with min guard assist;sitting Pt Will Perform Upper Body Bathing: with max assist;sitting Pt Will Transfer to Toilet: with mod assist;squat pivot transfer;bedside commode Additional ADL Goal #1: Pt will tolerate splint wear Lt UE Additional ADL Goal #2: Pt to demonstrate ability to maintain static sitting balance EOB with no more than min guard for 10 minutes in order to improve endurance for ADLs Additional ADL Goal #3: Pt to demonstrate Self ROM HEP with Min verbal cues in order to prevent contracture formation  Plan Discharge plan remains appropriate    Co-evaluation            SLP goals addressed during session: Swallowing    AM-PAC OT "6 Clicks" Daily Activity     Outcome Measure   Help from another person eating meals?: A Little Help from another person taking care of personal grooming?: A Lot Help from another person toileting, which includes using toliet, bedpan, or urinal?: Total Help from another  person bathing (including washing, rinsing, drying)?: Total Help from another person to put on and taking off regular upper body clothing?: Total Help from another person to put on and taking off regular lower body clothing?: Total 6 Click Score: 9    End of Session    OT Visit Diagnosis: Muscle weakness (generalized) (M62.81);Cognitive communication deficit (R41.841);Hemiplegia and hemiparesis Symptoms and signs involving cognitive functions: Cerebral infarction Hemiplegia - Right/Left: Left Hemiplegia - dominant/non-dominant: Non-Dominant Hemiplegia - caused by: Cerebral infarction   Activity Tolerance Patient tolerated treatment well   Patient Left in bed;with call bell/phone within reach;with bed alarm set   Nurse Communication Mobility status        Time: 5102-5852 OT Time Calculation (min): 27 min  Charges: OT General Charges $OT Visit: 1 Visit OT Treatments $Self Care/Home Management : 8-22 mins $Therapeutic Exercise: 8-22 mins  Lorre Munroe,  OTR/L   Lorre Munroe 10/28/2019, 1:34 PM

## 2019-10-28 NOTE — Progress Notes (Signed)
  Speech Language Pathology Treatment: Dysphagia  Patient Details Name: Donna [name] MRN: [id number] DOB: Dec 06, 1950 Today's Date: 10/28/2019 Time: 4098-1191 SLP Time Calculation (min) (ACUTE ONLY): 30 min  Assessment / Plan / Recommendation Clinical Impression  Pt with trach changed out today, still with number six Shiley.  She continues to demonstrate possible phonation with breathing and today admits to extraneous sounds with breathing for a "long time" prior to admission.    Note, pt remains stable for SNF, but bed has not been established at this time.  Goal of session today is to advance diet to improve intake with swallow precautions to mitigate risk.  Pt observed consuming graham cracker, eggs, mighty shake, and soda.  Clinically she appeared with adequate mastication without retention on left despite taking large bolus.  Moderate cues to verbalize need to take small bites - using teach back for clinical reasoning/aspiration risk.  No overt indication of aspiration and swallow appeared with minimal delay in swallow reflex.  Mild throat clearing noted after swallowing across all consistencies.  Pt with only laryngeal penetration on prior MBS studies.    Given medical stability and significant improvement, recommend diet advance to Dys3/thin to help maximize pt's intake and improve her QOL.  Pt agreeable to plan and RN informed.  Provided pt with written compensation strategies and posted in room for her.  Diet order obtained from MD.  Thanks!      HPI HPI: 69 y.o. female with PMH significant for HTN, HLD, CVA 06/2018, DM, anemia, obesity, OSA, and OA. Pt underwent R ICA angioplasty on 6/14. Pt with decline in neuro status and hemoglobin on 6/15, found to have a large R groin hematoma.  Pt has had Hbg 5.0 or less and is a Jehovah's witness. MRI showed scattered small acute infarcts along the right MCA/watershed territory. Solitary small acute infarct in the left parietal cortex and right  cerebellum. She was intubated on 6/19. Pt underwent tracheostomy on 7/3. PT found to be septic with RLL PNA on 7/6.  MBS 7/22 - started on dys1/nectars.  Patient now with a Shiley #6 cuffless trach in place.  She has planned PEG placement 7/30.        SLP Plan  Continue with current plan of care       Recommendations  Liquids provided via: Cup;Straw Medication Administration: Other (Comment) (consider with puree or via PEG) Supervision: Staff to assist with self feeding Compensations: Slow rate;Small sips/bites;Other (Comment);Clear throat intermittently (pmsv with meals) Postural Changes and/or Swallow Maneuvers: Seated upright 90 degrees;Upright 30-60 min after meal      Patient may use Passy-Muir Speech Valve: During all therapies with supervision PMSV Supervision: Full MD: Please consider changing trach tube to : Smaller size;Cuffless         Oral Care Recommendations: Oral care QID Follow up Recommendations: Skilled Nursing facility;24 hour supervision/assistance SLP Visit Diagnosis: Dysphagia, unspecified (R13.10) Plan: Continue with current plan of care       GO                [name] 10/28/2019, 10:36 AM   Rolena Infante, MS [facility] SLP Acute Rehab Services Office [phone]

## 2019-10-28 NOTE — TOC Progression Note (Addendum)
Transition of Care [facility]) - Progression Note    Patient Details  Name: Donna [name] MRN: [id number] Date of Birth: 1950/11/18  Transition [facility]) CM/SW Contact  Donna Frederick, LCSW Phone Number: 10/28/2019, 2:11 PM  Clinical Narrative:   [facility] can offer a bed but will not be able to take pt until Monday, 8/23 at the earliest.  Donna [name] asked for medicaid application to be submitted prior to pt transition since pt will likely need LTC after SNF.  CSW spoke with Donna [name] Financial Navigator, [phone], who did complete medicaid app and will have pt sign this afternoon and submit.  CSW spoke with pt sister Donna [name] with these updates.  She will speak to pt son and get back to CSW.  1330: Family wants to move forward with [facility].  Donna [name] at [facility] contacted regarding authorization.  After checking, she called back stating Berkley Harvey was supposed to be started Friday, but it was not.  Starting [place] today.    Expected Discharge Plan: Skilled Nursing Facility Barriers to Discharge: Continued Medical Work up  Expected Discharge Plan and Services Expected Discharge Plan: Skilled Nursing Facility                                               Social Determinants of Health (SDOH) Interventions    Readmission Risk Interventions No flowsheet data found.

## 2019-10-28 NOTE — Progress Notes (Signed)
Tracheostomy tube change note:  Pt's tracheostostomy tube was due for routine change today.  Donna [name] was changed and replaced with a #6 cuffless shiley trach tube without difficulty.  Trach tube placement was confirmed with positive color change on EZ cap CO2 dectector and auscultation of bilateral breath sounds.  Pt tolerated procedure well.  o2 saturation remains 95% on humidified room air at this time.

## 2019-10-28 NOTE — Progress Notes (Signed)
Donna [name]  QQP:[id number] DOB: [birth year] DOA: 08/25/2019 PCP: Minette Brine, FNP    Brief Narrative:  68 year old with a history of HTN, CVA with residual left hemiparesis, DM, anemia, and obesity who was admitted to [facility] August 25, 2019 to undergo a right ICA petrous/cervical junction angioplasty with stent due to symptomatic severe intracranial stenosis. Postoperatively she developed confusion with worsening left hemiparesis due to hemorrhagic hypotension leading to watershed CVAs.  She initially required Neo-Synephrine with transfer to the ICU. She had to be intubated due to pulmonary edema with severe encephalopathy. She failed extubation 6/28 and ultimately underwent tracheostomy placement 7/3. Attempts were made to place her in an LTAC but she was denied. Her course was further complicated by the development of an aspiration pneumonia 7/25. 7/30 a PEG tube was placed.  She is presently felt to be stable for discharge and attempts are being made to identify a SNF bed.  Significant Events:  6/14 admit  7/4 tracheostomy 7/30 PEG tube placement  Subjective: Awaiting placement in SNF.  Awake and conversant and in good spirits.  Has no new complaints.  Antimicrobials:  None  DVT prophylaxis: SCDs  Assessment & Plan:  Acute on chronic hypoxic respiratory failure Required intubation with subsequent failed extubation and ultimately required tracheostomy 7/4 - presently stable on trach collar  Right MCA CVA with left hemiparesis Status post right ICA junction balloon angioplasty complicated by right thigh and retroperitoneal hematoma - avoiding anticoagulation/antiplatelets  Acute metabolic encephalopathy Mental status continues to improve -continue Seroquel for now but can consider discontinuing once mental status fully stabilizes  Dysphagia Due to CVAs -failed multiple swallowing evaluations - PEG tube placed 7/30 -is now able to tolerate D3 diet with assistance  -continue tube feeding as well for now but may eventually be able to phase out tube feeding as oral intake continues to improve  Anemia of acute blood loss with hemorrhagic shock/hypotension Shock state/hypotension resolved - 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 - was dosed with Epogen Z12 and folic acid - continues to be dosed with iron - avoid antiplatelet/anticoagulants  Recent Labs  Lab 10/22/19 0412 10/27/19 0117  HGB 9.5* 10.3*    DM2 A1c 5.2 -CBG well controlled  HTN Blood pressure controlled  Debility/physical deconditioning Requires SNF placement   Code Status: FULL CODE Family Communication:  Status is: Inpatient  Remains inpatient appropriate because:Unsafe d/c plan   Dispo: The patient is from: Home              Anticipated d/c is to: SNF              Anticipated d/c date is: 1 day              Patient currently is medically stable to d/c.   Consultants:  none  Objective: Blood pressure (!) 121/94, pulse 99, temperature 98.3 F (36.8 C), temperature source Oral, resp. rate 18, height '5\' 1"'$  (1.549 m), weight 70.2 kg, SpO2 95 %.  Intake/Output Summary (Last 24 hours) at 10/28/2019 1920 Last data filed at 10/28/2019 4580 Gross per 24 hour  Intake --  Output 50 ml  Net -50 ml   Filed Weights   10/26/19 0500 10/27/19 2157 10/28/19 0138  Weight: 70 kg 70.2 kg 70.2 kg   While  Examination: General: No acute respiratory distress Lungs: CTA B Cardiovascular: RRR w/o M Abdomen: NT/ND, soft, BS+, no rebound Extremities: No C/C/E B LE     CBC: Recent Labs  Lab 10/22/19 0412 10/27/19 0117  WBC 6.4 6.1  HGB 9.5* 10.3*  HCT 32.3* 35.0*  MCV 95.6 95.6  PLT 319 257   Basic Metabolic Panel: Recent Labs  Lab 10/22/19 0412  NA 140  K 3.6  CL 101  CO2 30  GLUCOSE 190*  BUN 18  CREATININE 0.57  CALCIUM 9.9   GFR: Estimated Creatinine Clearance: 60.4 mL/min (by C-G formula based on SCr of 0.57 mg/dL).  Liver Function  Tests: No results for input(s): AST, ALT, ALKPHOS, BILITOT, PROT, ALBUMIN in the last 168 hours. No results for input(s): LIPASE, AMYLASE in the last 168 hours. No results for input(s): AMMONIA in the last 168 hours.  HbA1C: Hemoglobin A1C  Date/Time Value Ref Range Status  10/12/2017 12:00 AM 9.7  Final   Hgb A1c MFr Bld  Date/Time Value Ref Range Status  10/03/2019 04:30 PM 5.2 4.8 - 5.6 % Final    Comment:    (NOTE) Pre diabetes:          5.7%-6.4%  Diabetes:              >6.4%  Glycemic control for   <7.0% adults with diabetes   05/22/2019 01:12 PM 11.2 (H) 4.8 - 5.6 % Final    Comment:             Prediabetes: 5.7 - 6.4          Diabetes: >6.4          Glycemic control for adults with diabetes: <7.0     CBG: Recent Labs  Lab 10/27/19 2302 10/28/19 0301 10/28/19 0651 10/28/19 1239 10/28/19 1610  GLUCAP 117* 167* 253* 162* 171*     Scheduled Meds: . amLODipine  10 mg Per Tube Daily  . atorvastatin  40 mg Per NG tube Daily  . budesonide (PULMICORT) nebulizer solution  0.5 mg Nebulization BID  . chlorhexidine gluconate (MEDLINE KIT)  15 mL Mouth Rinse BID  . Chlorhexidine Gluconate Cloth  6 each Topical Daily  . famotidine  20 mg Per Tube BID  . feeding supplement (JEVITY 1.5 CAL/FIBER)  1,000 mL Per Tube Q24H  . feeding supplement (PROSource TF)  45 mL Per Tube TID  . ferrous sulfate  300 mg Per Tube TID WC  . hydrALAZINE  100 mg Per Tube Q6H  . hydrocerin   Topical TID  . insulin aspart  0-15 Units Subcutaneous Q4H  . insulin aspart  5 Units Subcutaneous Q4H  . insulin glargine  20 Units Subcutaneous Daily  . mouth rinse  15 mL Mouth Rinse 10 times per day  . QUEtiapine  100 mg Per Tube QHS      LOS: 64 days   Cherene Altes, MD [facility] Office  [phone] Pager - Text Page per Amion  If 7PM-7AM, please contact night-coverage per Amion 10/28/2019, 7:20 PM

## 2019-10-29 LAB — GLUCOSE, CAPILLARY
Glucose-Capillary: 185 mg/dL — ABNORMAL HIGH (ref 70–99)
Glucose-Capillary: 169 mg/dL — ABNORMAL HIGH (ref 70–99)
Glucose-Capillary: 113 mg/dL — ABNORMAL HIGH (ref 70–99)
Glucose-Capillary: 117 mg/dL — ABNORMAL HIGH (ref 70–99)
Glucose-Capillary: 165 mg/dL — ABNORMAL HIGH (ref 70–99)

## 2019-10-29 NOTE — Plan of Care (Signed)
  Problem: Education: Goal: Knowledge of General Education information will improve Description: Including pain rating scale, medication(s)/side effects and non-pharmacologic comfort measures Outcome: Progressing   Problem: Health Behavior/Discharge Planning: Goal: Ability to manage health-related needs will improve Outcome: Progressing   Problem: Clinical Measurements: Goal: Ability to maintain clinical measurements within normal limits will improve Outcome: Progressing Goal: Will remain free from infection Outcome: Progressing Goal: Diagnostic test results will improve Outcome: Progressing Goal: Respiratory complications will improve Outcome: Progressing Goal: Cardiovascular complication will be avoided Outcome: Progressing   Problem: Activity: Goal: Risk for activity intolerance will decrease Outcome: Progressing   Problem: Coping: Goal: Level of anxiety will decrease Outcome: Progressing   Problem: Elimination: Goal: Will not experience complications related to bowel motility Outcome: Progressing Goal: Will not experience complications related to urinary retention Outcome: Progressing   Problem: Pain Managment: Goal: General experience of comfort will improve Outcome: Progressing   Problem: Safety: Goal: Ability to remain free from injury will improve Outcome: Progressing   Problem: Skin Integrity: Goal: Risk for impaired skin integrity will decrease Outcome: Progressing   Problem: Cardiovascular: Goal: Vascular access site(s) Level 0-1 will be maintained Outcome: Progressing   Problem: Education: Goal: Knowledge of disease or condition will improve Outcome: Progressing Goal: Knowledge of secondary prevention will improve Outcome: Progressing Goal: Knowledge of patient specific risk factors addressed and post discharge goals established will improve Outcome: Progressing   Problem: Health Behavior/Discharge Planning: Goal: Ability to manage  health-related needs will improve Outcome: Progressing   Problem: Self-Care: Goal: Ability to participate in self-care as condition permits will improve Outcome: Progressing   Problem: Ischemic Stroke/TIA Tissue Perfusion: Goal: Complications of ischemic stroke/TIA will be minimized Outcome: Progressing   Problem: Activity: Goal: Ability to tolerate increased activity will improve Outcome: Progressing   Problem: Respiratory: Goal: Ability to maintain a clear airway and adequate ventilation will improve Outcome: Progressing   Problem: Role Relationship: Goal: Method of communication will improve Outcome: Progressing   

## 2019-10-29 NOTE — Progress Notes (Signed)
Physical Therapy Treatment Patient Details Name: Donna [name] MRN: [id number] DOB: 1950/09/15 Today's Date: 10/29/2019    History of Present Illness Pt is 69 y.o. female with PMH significant for HTN, HLD, CVA 06/2018, DM, anemia, obesity, OSA, and OA. She is known to [facility] and has been followed by Dr. Corliss Skains since 10/2018. Pt underwent R ICA angioplasty on 6/14. Pt with decline in neuro status and hemoglobin on 6/15, found to have a large R groin hematoma.  Pt has had Hbg 5.0 or less and is a Jehovah's witness.  She was intubated on 6/19. Pt underwent tracheostomy on 7/3. PT found to be septic with RLL PNA on 7/6. 7/13 trach tolerating extubation. Patient had a significant event on 7/25 resulting in respiratory distress, oxygen desaturation, and retroperitoneal hematomas in the right iliac/inguinal regions.  Had PEG placed on 10/10/19.  Currently awaiting SNF bed.    PT Comments    Patient was received laying in hospital bed at the start of the session with trach collar on room air. O2 sat 97%. She was less talkative today, stating only short phrases/single word answers. Appeared to be more tired than usual. Supine <> sit transfer MaxAx2 with HOB elevated. Able to move RLE off bed, but could not initiate moving LLE and needed assistance to lift trunk. Poor seated balance with L lateral and posterior lean. TotalAx2 sit <> stand in the [place]. Totalx2 stand pivot transfer with Stedy into chair. Could not extend hips to stand upright, needs help maintaining L hand on Stedy. Once standing, patient was using RUE to push herself L, had L lateral lean, and was not able to re-correct posture with cueing. Transferred to recliner chair with use of Stedy. O2 sat remained WNL throughout therapy session without use of trach tubing. She was left sitting up in chair with all needs within reach and chair alarm set. Communicated with Nurse Tech to use [facility] for transfer back into bed.    Follow Up Recommendations   SNF     Equipment Recommendations  Wheelchair (measurements PT);Wheelchair cushion (measurements PT);Hospital bed    Recommendations for Other Services       Precautions / Restrictions Precautions Precautions: Fall Precaution Comments: dense L hemiplegia, PMV Required Braces or Orthoses: Other Brace Other Brace: L resting hand splint, prevalon boots    Mobility  Bed Mobility Overal bed mobility: Needs Assistance Bed Mobility: Sit to Supine     Supine to sit: Max assist;+2 for physical assistance;HOB elevated        Transfers   Equipment used: Ambulation equipment used Transfers: Sit to/from Stand Sit to Stand: Total assist;+2 physical assistance         General transfer comment: TotalAx2 to stand in [place], cannot extend hips to stand upright  Ambulation/Gait             General Gait Details: not assessed   Stairs             Wheelchair Mobility    Modified Rankin (Stroke Patients Only)       Balance Overall balance assessment: Needs assistance Sitting-balance support: Feet supported;Single extremity supported Sitting balance-Leahy Scale: Poor   Postural control: Left lateral lean;Posterior lean   Standing balance-Leahy Scale: Zero                              Cognition Arousal/Alertness: Awake/alert Behavior During Therapy: Flat affect Overall Cognitive Status: Impaired/Different from baseline Area of Impairment:  Problem solving;Safety/judgement;Awareness;Orientation;Attention                   Current Attention Level: Sustained   Following Commands: Follows multi-step commands with increased time;Follows multi-step commands inconsistently;Follows one step commands inconsistently;Follows one step commands with increased time Safety/Judgement: Decreased awareness of safety;Decreased awareness of deficits Awareness: Intellectual Problem Solving: Slow processing;Requires verbal cues;Decreased initiation;Difficulty  sequencing;Requires tactile cues General Comments: Cognition and awareness fluctuating, not as talkative today, distracted easily      Exercises      General Comments        Pertinent Vitals/Pain Pain Assessment: No/denies pain Faces Pain Scale: No hurt Pain Intervention(s): Monitored during session;Limited activity within patient's tolerance    Home Living                      Prior Function            PT Goals (current goals can now be found in the care plan section) Acute Rehab PT Goals Patient Stated Goal: none stated PT Goal Formulation: Patient unable to participate in goal setting Time For Goal Achievement: 11/10/19 Potential to Achieve Goals: Good Progress towards PT goals: Progressing toward goals    Frequency    Min 3X/week      PT Plan Current plan remains appropriate    Co-evaluation              AM-PAC PT "6 Clicks" Mobility   Outcome Measure  Help needed turning from your back to your side while in a flat bed without using bedrails?: Total Help needed moving from lying on your back to sitting on the side of a flat bed without using bedrails?: Total Help needed moving to and from a bed to a chair (including a wheelchair)?: Total Help needed standing up from a chair using your arms (e.g., wheelchair or bedside chair)?: Total Help needed to walk in hospital room?: Total Help needed climbing 3-5 steps with a railing? : Total 6 Click Score: 6    End of Session Equipment Utilized During Treatment: Gait belt Activity Tolerance: Patient tolerated treatment well Patient left: in chair;with call bell/phone within reach;with chair alarm set Nurse Communication: Mobility status;Need for lift equipment PT Visit Diagnosis: Other abnormalities of gait and mobility (R26.89);Muscle weakness (generalized) (M62.81);Other symptoms and signs involving the nervous system (R29.898);Hemiplegia and hemiparesis Hemiplegia - Right/Left: Left Hemiplegia -  dominant/non-dominant: Non-dominant Hemiplegia - caused by: Cerebral infarction     Time:  -     Charges:                       Elisha Ponder, SPT, ATC

## 2019-10-29 NOTE — Progress Notes (Signed)
PROGRESS NOTE    CYNCERE RUHE  VUY:[id number] DOB: 09-23-1950 DOA: 08/25/2019 PCP: Minette Brine, FNP   Brief Narrative: Donna [name] is a 69 y.o. female with a history of HTN, CVA with residual left hemiparesis, DM, anemia, and obesity who was admitted to [facility] August 25, 2019 to undergo a right ICA petrous/cervical junction angioplasty with stent due to symptomatic severe intracranial stenosis. Postoperatively she developed confusion with worsening left hemiparesis due to hemorrhagic hypotension leading to watershed CVAs.  She initially required Neo-Synephrine with transfer to the ICU. She had to be intubated due to pulmonary edema with severe encephalopathy. She failed extubation 6/28 and ultimately underwent tracheostomy placement 7/3. Attempts were made to place her in an LTAC but she was denied. Her course was further complicated by the development of an aspiration pneumonia 7/25. 7/30 a PEG tube was placed.   Assessment & Plan:   Principal Problem:   Internal carotid artery stenosis, right Active Problems:   Essential hypertension   Hyperlipidemia   Cerebral infarction (HCC)   Type 2 diabetes mellitus with vascular disease ([place])   AKI (acute kidney injury) ([place])   Anemia   Acute respiratory failure (HCC)   Acute blood loss anemia   Palliative care by specialist   Goals of care, counseling/discussion   Hypoxia   Sepsis with acute hypoxic respiratory failure without septic shock (HCC)   Hypokalemia   Status post tracheostomy ([place])   Pressure injury of skin   Internal carotid artery stenosis, 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 Patient's course complicated by right thigh and retroperitoneal hematoma; patient is no longer on anticoagulation/antiplatelets for this reason. Currently stable. PT/OT recommending SNF.  Acute on chronic respiratory failure with hypoxia Patient required intubation from  6/19-6/23 and from 6/23-6/28 with subsequent tracheostomy for failed extubation. Currently stable on trach collar.  Acute metabolic encephalopathy In setting of above and has improved.   Dysphagia Secondary to above. PEG placed on 7/30. Initially failed multiple swallow studies and is now on a dysphagia 3 diet.  Acute blood loss anemia Hemorrhagic shock Hypotension No blood products secondary to patient's religious beliefs. Resolved. Patient given Epogen, Vitamin B12 and folate in addition to iron. Recommendations for no anticoagulation or antiplatelets.  Pressure injury Left/right sacrum, left/upper buttock, left/medial  buttock   DVT prophylaxis: SCDs Code Status:   Code Status: Full Code Family Communication: None at bedside Disposition Plan: Discharge to SNF likely on 8/23 pending bed availability. Medically stable for discharge   Consultants:   PCCM  Interventional radiology  Procedures:   TRACHEOSTOMY  PEG TUBE PLACEMENT  Antimicrobials:  None    Subjective: No issues  Objective: Vitals:   10/28/19 1948 10/28/19 2108 10/28/19 2321 10/29/19 0404  BP:  (!) 160/74    Pulse:  (!) 103 (!) 101 (!) 110  Resp:  _0 Temp:  99.3 F (37.4 C)    TempSrc:  Oral    SpO2: 98% 100% 93% 98%  Weight:      Height:       No intake or output data in the 24 hours ending 10/29/19 0800 Filed Weights   10/26/19 0500 10/27/19 2157 10/28/19 0138  Weight: 70 kg 70.2 kg 70.2 kg    Examination:  General exam: Appears calm and comfortable Respiratory system: Clear to auscultation. Trach in place. Respiratory effort normal. Cardiovascular system: S1 & S2 heard, RRR. 2/6 systolic murmur. Gastrointestinal system: Abdomen  is nondistended, soft and nontender. No organomegaly or masses felt. Normal bowel sounds heard. Central nervous system: Alert. No focal neurological deficits. Musculoskeletal: No edema. No calf tenderness Skin: No cyanosis. No rashes Psychiatry:  Judgement and insight appear normal. Mood & affect appropriate.     Data Reviewed: I have personally reviewed following labs and imaging studies  CBC Lab Results  Component Value Date   WBC 6.1 10/27/2019   RBC 3.66 (L) 10/27/2019   HGB 10.3 (L) 10/27/2019   HCT 35.0 (L) 10/27/2019   MCV 95.6 10/27/2019   MCH 28.1 10/27/2019   PLT 300 10/27/2019   MCHC 29.4 (L) 10/27/2019   RDW 16.6 (H) 10/27/2019   LYMPHSABS 0.8 10/13/2019   MONOABS 0.4 10/13/2019   EOSABS 0.5 10/13/2019   BASOSABS 0.0 16/38/4536     Last metabolic panel Lab Results  Component Value Date   NA 140 10/22/2019   K 3.6 10/22/2019   CL 101 10/22/2019   CO2 30 10/22/2019   BUN 18 10/22/2019   CREATININE 0.57 10/22/2019   GLUCOSE 190 (H) 10/22/2019   GFRNONAA >60 10/22/2019   GFRAA >60 10/22/2019   CALCIUM 9.9 10/22/2019   PHOS 3.6 10/13/2019   PROT 5.5 (L) 09/26/2019   ALBUMIN 2.5 (L) 10/06/2019   LABGLOB 3.5 05/22/2019   AGRATIO 1.1 (L) 05/22/2019   BILITOT 0.5 09/26/2019   ALKPHOS 57 09/26/2019   AST 31 09/26/2019   ALT 42 09/26/2019   ANIONGAP 9 10/22/2019    CBG (last 3)  Recent Labs    10/28/19 1933 10/28/19 2353 10/29/19 0326  GLUCAP 153* 144* 185*     GFR: Estimated Creatinine Clearance: 60.4 mL/min (by C-G formula based on SCr of 0.57 mg/dL).  Coagulation Profile: No results for input(s): INR, PROTIME in the last 168 hours.  No results found for this or any previous visit (from the past 240 hour(s)).      Radiology Studies: No results found.      Scheduled Meds: . amLODipine  10 mg Per Tube Daily  . atorvastatin  40 mg Per NG tube Daily  . budesonide (PULMICORT) nebulizer solution  0.5 mg Nebulization BID  . chlorhexidine gluconate (MEDLINE KIT)  15 mL Mouth Rinse BID  . Chlorhexidine Gluconate Cloth  6 each Topical Daily  . famotidine  20 mg Per Tube BID  . feeding supplement (JEVITY 1.5 CAL/FIBER)  1,000 mL Per Tube Q24H  . feeding supplement (PROSource TF)  45  mL Per Tube TID  . ferrous sulfate  300 mg Per Tube TID WC  . hydrALAZINE  100 mg Per Tube Q6H  . hydrocerin   Topical TID  . insulin aspart  0-15 Units Subcutaneous Q4H  . insulin aspart  5 Units Subcutaneous Q4H  . insulin glargine  20 Units Subcutaneous Daily  . mouth rinse  15 mL Mouth Rinse 10 times per day  . QUEtiapine  50 mg Per Tube QHS   Continuous Infusions:   LOS: 65 days     Cordelia Poche, MD [facility] 10/29/2019, 8:00 AM  If 7PM-7AM, please contact night-coverage [email or web address]

## 2019-10-29 NOTE — Progress Notes (Addendum)
  Speech Language Pathology Treatment: Dysphagia  Patient Details Name: Donna [name] MRN: [id number] DOB: [birth year] Today's Date: 10/29/2019 Time: 1050-1105 SLP Time Calculation (min) (ACUTE ONLY): 15 min  Assessment / Plan / Recommendation Clinical Impression  Donna [name] seen today following texture upgrade to chopped meats (yesterday). Pt awake and wearing speaking valve when therapist arrived. Response time has improved since this therapist last worked with pt. She has been cleared to wear at all waking hours and work of breathing was stable; RR and HR in normal range. Responses 100% intelligible. She appears to tolerate straw sips water and cracker without signs of aspiration however water trials following cracker resulted in consistent cough (perhaps due to an oral coordination issue and particles of cracker penetrated?). She should sit in an upright position with valve donned. Continue Dys 3/thin and precautions.    HPI HPI: 69 y.o. female with PMH significant for HTN, HLD, CVA 06/2018, DM, anemia, obesity, OSA, and OA. Pt underwent R ICA angioplasty on 6/14. Pt with decline in neuro status and hemoglobin on 6/15, found to have a large R groin hematoma.  Pt has had Hbg 5.0 or less and is a Jehovah's witness. MRI showed scattered small acute infarcts along the right MCA/watershed territory. Solitary small acute infarct in the left parietal cortex and right cerebellum. She was intubated on 6/19. Pt underwent tracheostomy on 7/3. PT found to be septic with RLL PNA on 7/6.  MBS 7/22 - started on dys1/nectars.  Patient now with a Shiley #6 cuffless trach in place.  She has planned PEG placement 7/30.        SLP Plan  Continue with current plan of care       Recommendations  Diet recommendations: Dysphagia 3 (mechanical soft);Thin liquid Liquids provided via: Cup;Straw Medication Administration: Whole meds with puree Supervision: Patient able to self feed;Intermittent supervision to cue for  compensatory strategies Compensations: Slow rate;Small sips/bites;Clear throat intermittently Postural Changes and/or Swallow Maneuvers: Seated upright 90 degrees;Upright 30-60 min after meal      Patient may use Passy-Muir Speech Valve: During all waking hours (remove during sleep) PMSV Supervision: Intermittent MD: Please consider changing trach tube to : Smaller size         Oral Care Recommendations: Oral care BID Follow up Recommendations: Skilled Nursing facility SLP Visit Diagnosis: Dysphagia, unspecified (R13.10) Plan: Continue with current plan of care       GO                Donna [name] 10/29/2019, 11:18 AM

## 2019-10-30 LAB — GLUCOSE, CAPILLARY
Glucose-Capillary: 111 mg/dL — ABNORMAL HIGH (ref 70–99)
Glucose-Capillary: 136 mg/dL — ABNORMAL HIGH (ref 70–99)
Glucose-Capillary: 172 mg/dL — ABNORMAL HIGH (ref 70–99)
Glucose-Capillary: 195 mg/dL — ABNORMAL HIGH (ref 70–99)
Glucose-Capillary: 262 mg/dL — ABNORMAL HIGH (ref 70–99)
Glucose-Capillary: 56 mg/dL — ABNORMAL LOW (ref 70–99)

## 2019-10-30 MED ORDER — INSULIN ASPART 100 UNIT/ML ~~LOC~~ SOLN
0.0000 [IU] | SUBCUTANEOUS | Status: DC
  Administered 2019-10-31 – 2019-11-01 (×4): 3 [IU] via SUBCUTANEOUS
  Administered 2019-11-01: 2 [IU] via SUBCUTANEOUS

## 2019-10-30 MED ORDER — DEXTROSE 50 % IV SOLN
INTRAVENOUS | Status: AC
  Administered 2019-10-30: 50 mL via INTRAVENOUS
  Filled 2019-10-30: qty 50

## 2019-10-30 MED ORDER — DEXTROSE 50 % IV SOLN: 1.0000 | Freq: Once | INTRAVENOUS | Status: AC

## 2019-10-30 NOTE — Progress Notes (Signed)
Patient has a #6 shiley uncuffed with a PMV. Patient is on and off room air humidification. SPO2 stable when off. Patient has a strong cough and requires minimal suctioning. Vitals are stable. BBS clear; diminished. No distress noted.

## 2019-10-30 NOTE — TOC Progression Note (Signed)
Transition of Care [facility]) - Progression Note    Patient Details  Name: Donna [name] MRN: [id number] Date of Birth: [birth year]  Transition of [facility]) CM/SW Contact  Lorri Frederick, LCSW Phone Number: 10/30/2019, 1:22 PM  Clinical Narrative:   Request from Revonda Standard at [facility] for specifics on the trach.  Respiratory put in information.  [facility] has received authorization and can take pt tomorrow.  Sister informed.     Expected Discharge Plan: Skilled Nursing Facility Barriers to Discharge: Continued Medical Work up  Expected Discharge Plan and Services Expected Discharge Plan: Skilled Nursing Facility                                               Social Determinants of Health (SDOH) Interventions    Readmission Risk Interventions No flowsheet data found.

## 2019-10-30 NOTE — Progress Notes (Signed)
PROGRESS NOTE    Donna [name]  FGH:[id number] DOB: Jul 05, 1950 DOA: 08/25/2019 PCP: Minette Brine, FNP   Brief Narrative: Donna [name] is a 69 y.o. female with a history of HTN, CVA with residual left hemiparesis, DM, anemia, and obesity who was admitted to [facility] August 25, 2019 to undergo a right ICA petrous/cervical junction angioplasty with stent due to symptomatic severe intracranial stenosis. Postoperatively she developed confusion with worsening left hemiparesis due to hemorrhagic hypotension leading to watershed CVAs.  She initially required Neo-Synephrine with transfer to the ICU. She had to be intubated due to pulmonary edema with severe encephalopathy. She failed extubation 6/28 and ultimately underwent tracheostomy placement 7/3. Attempts were made to place her in an LTAC but she was denied. Her course was further complicated by the development of an aspiration pneumonia 7/25. 7/30 a PEG tube was placed.   Assessment & Plan:   Principal Problem:   Internal carotid artery stenosis, right Active Problems:   Essential hypertension   Hyperlipidemia   Cerebral infarction (HCC)   Type 2 diabetes mellitus with vascular disease ([place])   AKI (acute kidney injury) ([place])   Anemia   Acute respiratory failure (HCC)   Acute blood loss anemia   Palliative care by specialist   Goals of care, counseling/discussion   Hypoxia   Sepsis with acute hypoxic respiratory failure without septic shock (HCC)   Hypokalemia   Status post tracheostomy ([place])   Pressure injury of skin   Internal carotid artery stenosis, 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 Patient's course complicated by right thigh and retroperitoneal hematoma; patient is no longer on anticoagulation/antiplatelets for this reason. Currently stable. PT/OT recommending SNF.  Acute on chronic respiratory failure with hypoxia Patient required intubation from  6/19-6/23 and from 6/23-6/28 with subsequent tracheostomy for failed extubation. Currently stable on trach collar.  Acute metabolic encephalopathy In setting of above and has improved.   Dysphagia Secondary to above. PEG placed on 7/30. Initially failed multiple swallow studies and is now on a dysphagia 3 diet.  Acute blood loss anemia Hemorrhagic shock Hypotension No blood products secondary to patient's religious beliefs. Resolved. Patient given Epogen, Vitamin B12 and folate in addition to iron. Recommendations for no anticoagulation or antiplatelets.  Diabetes mellitus Patient is on insulin as an outpatient. Currently on tube feeds. Hypoglycemia this morning. -Continue Lantus and Novolog 5 units q4 hours -Decrease SSI to sensitive scale  Pressure injury Left/right sacrum, left/upper buttock, left/medial  buttock   DVT prophylaxis: SCDs Code Status:   Code Status: Full Code Family Communication: None at bedside Disposition Plan: Discharge to SNF likely on 8/23 pending bed availability. Medically stable for discharge   Consultants:   PCCM  Interventional radiology  Procedures:   TRACHEOSTOMY  PEG TUBE PLACEMENT  Antimicrobials:  None    Subjective: Patient with no concerns.  Objective: Vitals:   10/30/19 0835 10/30/19 1213 10/30/19 1216 10/30/19 1550  BP:   (!) 157/53   Pulse:  (!) 104 99 97  Resp:  18  19  Temp:      TempSrc:      SpO2: 100% 100%  99%  Weight:      Height:        Intake/Output Summary (Last 24 hours) at 10/30/2019 1712 Last data filed at 10/29/2019 2240 Gross per 24 hour  Intake 240 ml  Output --  Net 240 ml   Filed Weights   10/26/19 0500  10/27/19 2157 10/28/19 0138  Weight: 70 kg 70.2 kg 70.2 kg    Examination:  General exam: Appears calm and comfortable Respiratory system: Clear to auscultation. Respiratory effort normal. Cardiovascular system: S1 & S2 heard, RRR Gastrointestinal system: Abdomen is nondistended, soft  and nontender. No organomegaly or masses felt. Normal bowel sounds heard. Central nervous system: Alert. No focal neurological deficits. Musculoskeletal: No calf tenderness Skin: No cyanosis. No rashes Psychiatry: Judgement and insight appear normal. Mood & affect appropriate.      Data Reviewed: I have personally reviewed following labs and imaging studies  CBC Lab Results  Component Value Date   WBC 6.1 10/27/2019   RBC 3.66 (L) 10/27/2019   HGB 10.3 (L) 10/27/2019   HCT 35.0 (L) 10/27/2019   MCV 95.6 10/27/2019   MCH 28.1 10/27/2019   PLT 300 10/27/2019   MCHC 29.4 (L) 10/27/2019   RDW 16.6 (H) 10/27/2019   LYMPHSABS 0.8 10/13/2019   MONOABS 0.4 10/13/2019   EOSABS 0.5 10/13/2019   BASOSABS 0.0 91/91/6606     Last metabolic panel Lab Results  Component Value Date   NA 140 10/22/2019   K 3.6 10/22/2019   CL 101 10/22/2019   CO2 30 10/22/2019   BUN 18 10/22/2019   CREATININE 0.57 10/22/2019   GLUCOSE 190 (H) 10/22/2019   GFRNONAA >60 10/22/2019   GFRAA >60 10/22/2019   CALCIUM 9.9 10/22/2019   PHOS 3.6 10/13/2019   PROT 5.5 (L) 09/26/2019   ALBUMIN 2.5 (L) 10/06/2019   LABGLOB 3.5 05/22/2019   AGRATIO 1.1 (L) 05/22/2019   BILITOT 0.5 09/26/2019   ALKPHOS 57 09/26/2019   AST 31 09/26/2019   ALT 42 09/26/2019   ANIONGAP 9 10/22/2019    CBG (last 3)  Recent Labs    10/30/19 0740 10/30/19 1140 10/30/19 1614  GLUCAP 262* 56* 136*     GFR: Estimated Creatinine Clearance: 60.4 mL/min (by C-G formula based on SCr of 0.57 mg/dL).  Coagulation Profile: No results for input(s): INR, PROTIME in the last 168 hours.  No results found for this or any previous visit (from the past 240 hour(s)).      Radiology Studies: No results found.      Scheduled Meds: . amLODipine  10 mg Per Tube Daily  . atorvastatin  40 mg Per NG tube Daily  . budesonide (PULMICORT) nebulizer solution  0.5 mg Nebulization BID  . chlorhexidine gluconate (MEDLINE KIT)  15 mL  Mouth Rinse BID  . Chlorhexidine Gluconate Cloth  6 each Topical Daily  . famotidine  20 mg Per Tube BID  . feeding supplement (JEVITY 1.5 CAL/FIBER)  1,000 mL Per Tube Q24H  . feeding supplement (PROSource TF)  45 mL Per Tube TID  . ferrous sulfate  300 mg Per Tube TID WC  . hydrALAZINE  100 mg Per Tube Q6H  . hydrocerin   Topical TID  . insulin aspart  0-15 Units Subcutaneous Q4H  . insulin aspart  5 Units Subcutaneous Q4H  . insulin glargine  20 Units Subcutaneous Daily  . mouth rinse  15 mL Mouth Rinse 10 times per day  . QUEtiapine  50 mg Per Tube QHS   Continuous Infusions:   LOS: 66 days     Cordelia Poche, MD [facility] 10/30/2019, 5:12 PM  If 7PM-7AM, please contact night-coverage [email or web address]

## 2019-10-31 LAB — GLUCOSE, CAPILLARY
Glucose-Capillary: 101 mg/dL — ABNORMAL HIGH (ref 70–99)
Glucose-Capillary: 111 mg/dL — ABNORMAL HIGH (ref 70–99)
Glucose-Capillary: 113 mg/dL — ABNORMAL HIGH (ref 70–99)
Glucose-Capillary: 120 mg/dL — ABNORMAL HIGH (ref 70–99)
Glucose-Capillary: 121 mg/dL — ABNORMAL HIGH (ref 70–99)
Glucose-Capillary: 208 mg/dL — ABNORMAL HIGH (ref 70–99)
Glucose-Capillary: 219 mg/dL — ABNORMAL HIGH (ref 70–99)
Glucose-Capillary: 225 mg/dL — ABNORMAL HIGH (ref 70–99)

## 2019-10-31 LAB — SARS CORONAVIRUS 2 BY RT PCR (HOSPITAL ORDER, PERFORMED IN ~~LOC~~ HOSPITAL LAB): SARS Coronavirus 2: NEGATIVE

## 2019-10-31 MED ORDER — JEVITY 1.5 CAL/FIBER PO LIQD: 1000.0000 mL | ORAL | Status: AC

## 2019-10-31 MED ORDER — FAMOTIDINE 20 MG PO TABS: 20.0000 mg | ORAL_TABLET | Freq: Two times a day (BID) | ORAL | Status: AC

## 2019-10-31 MED ORDER — FERROUS SULFATE 300 (60 FE) MG/5ML PO SYRP: 300.0000 mg | ORAL_SOLUTION | Freq: Three times a day (TID) | ORAL | 3 refills | Status: AC

## 2019-10-31 MED ORDER — INSULIN ASPART 100 UNIT/ML ~~LOC~~ SOLN: 5.0000 [IU] | SUBCUTANEOUS | Status: AC

## 2019-10-31 MED ORDER — ENSURE ENLIVE PO LIQD
237.0000 mL | Freq: Two times a day (BID) | ORAL | Status: DC
  Administered 2019-11-01 (×2): 237 mL via ORAL
  Filled 2019-10-31: qty 237

## 2019-10-31 MED ORDER — HYDRALAZINE HCL 100 MG PO TABS: 100.0000 mg | ORAL_TABLET | Freq: Four times a day (QID) | ORAL | Status: AC

## 2019-10-31 MED ORDER — INSULIN GLARGINE 100 UNIT/ML ~~LOC~~ SOLN: 20.0000 [IU] | Freq: Every day | SUBCUTANEOUS | 11 refills | Status: AC

## 2019-10-31 MED ORDER — ATORVASTATIN CALCIUM 40 MG PO TABS: 40.0000 mg | ORAL_TABLET | Freq: Every day | ORAL | Status: AC

## 2019-10-31 MED ORDER — QUETIAPINE FUMARATE 50 MG PO TABS: 50.0000 mg | ORAL_TABLET | Freq: Every day | ORAL | Status: AC

## 2019-10-31 MED ORDER — BUDESONIDE 0.5 MG/2ML IN SUSP: 0.5000 mg | Freq: Two times a day (BID) | RESPIRATORY_TRACT | 12 refills | Status: AC

## 2019-10-31 MED ORDER — PROSOURCE TF PO LIQD: 45.0000 mL | Freq: Three times a day (TID) | ORAL | Status: AC

## 2019-10-31 NOTE — Progress Notes (Signed)
Physical Therapy Treatment Patient Details Name: Donna [name] MRN: [id number] DOB: 11-26-1950 Today's Date: 10/31/2019    History of Present Illness Pt is 69 y.o. female with PMH significant for HTN, HLD, CVA 06/2018, DM, anemia, obesity, OSA, and OA. She is known to [facility] and has been followed by Dr. Corliss Skains since 10/2018. Pt underwent R ICA angioplasty on 6/14. Pt with decline in neuro status and hemoglobin on 6/15, found to have a large R groin hematoma.  Pt has had Hbg 5.0 or less and is a Jehovah's witness.  She was intubated on 6/19. Pt underwent tracheostomy on 7/3. PT found to be septic with RLL PNA on 7/6. 7/13 trach tolerating extubation. Patient had a significant event on 7/25 resulting in respiratory distress, oxygen desaturation, and retroperitoneal hematomas in the right iliac/inguinal regions.  Had PEG placed on 10/10/19.  Currently awaiting SNF bed.   PT Comments    Patient was laying in hospital bed on room air at start of physical therapy session. She was less talkative today only saying a few one word replies and followed commands less consistently. Today's session focused on therapeutic exercises in bed: D1/D2 shoulder flexion, SLR's, & ankle pumps x 10 bilaterally. Stretching of gastrocnemius/soleus, hamstrings, anterior shoulder musculature, and finger flexors x 30 sec to prevent contractures. HR 100, SpO2 96%. She was left laying in bed with all needs within reach and bed alarm set.    Follow Up Recommendations  SNF     Equipment Recommendations  Wheelchair (measurements PT);Wheelchair cushion (measurements PT);Hospital bed    Recommendations for Other Services       Precautions / Restrictions Precautions Precautions: Fall Precaution Comments: dense L hemiplegia, PMV Required Braces or Orthoses: Other Brace Other Brace: L resting hand splint Restrictions Weight Bearing Restrictions: No    Mobility  Bed Mobility               General bed mobility  comments: not assessed  Transfers                 General transfer comment: not assessed  Ambulation/Gait             General Gait Details: not assessed   Stairs             Wheelchair Mobility    Modified Rankin (Stroke Patients Only)       Balance       Sitting balance - Comments: not assessed today       Standing balance comment: not assessed today                            Cognition Arousal/Alertness: Awake/alert Behavior During Therapy: Flat affect Overall Cognitive Status: Impaired/Different from baseline Area of Impairment: Problem solving;Safety/judgement;Awareness;Orientation;Attention;Following commands                   Current Attention Level: Sustained   Following Commands: Follows one step commands inconsistently;Follows one step commands with increased time Safety/Judgement: Decreased awareness of safety;Decreased awareness of deficits   Problem Solving: Slow processing;Requires verbal cues;Decreased initiation;Difficulty sequencing;Requires tactile cues General Comments: Cognition and awareness fluctuating, not as talkative today, distracted easily      Exercises      General Comments        Pertinent Vitals/Pain Faces Pain Scale: Hurts little more Pain Location: tightness on L side Pain Descriptors / Indicators: Sore Pain Intervention(s): Monitored during session;Limited activity within patient's tolerance  Home Living                      Prior Function            PT Goals (current goals can now be found in the care plan section) Acute Rehab PT Goals Patient Stated Goal: none stated PT Goal Formulation: Patient unable to participate in goal setting Time For Goal Achievement: 11/10/19 Potential to Achieve Goals: Good Progress towards PT goals: Progressing toward goals    Frequency    Min 3X/week      PT Plan Current plan remains appropriate    Co-evaluation               AM-PAC PT "6 Clicks" Mobility   Outcome Measure  Help needed turning from your back to your side while in a flat bed without using bedrails?: Total Help needed moving from lying on your back to sitting on the side of a flat bed without using bedrails?: Total Help needed moving to and from a bed to a chair (including a wheelchair)?: Total Help needed standing up from a chair using your arms (e.g., wheelchair or bedside chair)?: Total Help needed to walk in hospital room?: Total Help needed climbing 3-5 steps with a railing? : Total 6 Click Score: 6    End of Session   Activity Tolerance: Patient tolerated treatment well Patient left: in bed;with call bell/phone within reach;with bed alarm set Nurse Communication: Mobility status PT Visit Diagnosis: Other abnormalities of gait and mobility (R26.89);Muscle weakness (generalized) (M62.81);Other symptoms and signs involving the nervous system (R29.898);Hemiplegia and hemiparesis Hemiplegia - dominant/non-dominant: Non-dominant Hemiplegia - caused by: Cerebral infarction     Time:  -     Charges:                        Elisha Ponder, SPT, ATC

## 2019-10-31 NOTE — TOC Transition Note (Signed)
Transition of Care [facility]) - CM/SW Discharge Note   Patient Details  Name: Donna [name] MRN: [id number] Date of Birth: [birth year]  Transition of Care [facility]) CM/SW Contact:  Lorri Frederick, LCSW Phone Number: 10/31/2019, 1:13 PM   Clinical Narrative:   Pt going to room 204A.  RN call report to [phone].    Final next level of care: Skilled Nursing Facility Barriers to Discharge: Barriers Resolved   Patient Goals and CMS Choice Patient states their goals for this hospitalization and ongoing recovery are:: SNF CMS Medicare.gov Compare Post Acute Care list provided to:: Other (Comment Required) Choice offered to / list presented to : Adult Children  Discharge Placement              Patient chooses bed at: [facility] Patient to be transferred to facility by: [facility] Name of family member notified: Scarlette Calico, sister Patient and family notified of of transfer: 10/31/19  Discharge Plan and Services                                     Social Determinants of Health (SDOH) Interventions     Readmission Risk Interventions No flowsheet data found.

## 2019-10-31 NOTE — NC FL2 (Signed)
[place] MEDICAID FL2 LEVEL OF CARE SCREENING TOOL     IDENTIFICATION  Patient Name: Donna [name] Birthdate: 1951/02/02 Sex: female Admission Date (Current Location): 08/25/2019  [facility] and [place] Number:  Herbalist and Address:  [facility] [place] [address] [place], [place] [address]      Provider Number: [id number]  Attending Physician Name and Address:  Mariel Aloe, MD  Relative Name and Phone Number:  Younique Casad - son; [phone]    Current Level of Care: Hospital Recommended Level of Care: [place] Prior Approval Number:    Date Approved/Denied:   PASRR Number:  (PASRR number pending)  Discharge Plan: SNF    Current Diagnoses: Patient Active Problem List   Diagnosis Date Noted  . Pressure injury of skin 10/17/2019  . Status post tracheostomy ([place])   . Hypokalemia   . Hypoxia   . Sepsis with acute hypoxic respiratory failure without septic shock ([place])   . Palliative care by specialist   . Goals of care, counseling/discussion   . Acute blood loss anemia   . Acute respiratory failure ([place])   . AKI (acute kidney injury) ([place]) 08/26/2019  . Anemia 08/26/2019  . Internal carotid artery stenosis, right 08/25/2019  . Pain due to onychomycosis of toenails of both feet 08/06/2019  . Diabetic neuropathy ([place]) 08/06/2019  . Type 2 diabetes mellitus with vascular disease ([place]) 08/06/2019  . Statin myopathy 06/26/2019  . COVID-19 virus infection 03/27/2019  . Pre-operative cardiovascular examination 03/04/2019  . History of CVA (cerebrovascular accident) 03/04/2019  . PVD (peripheral vascular disease) ([place]) 03/04/2019  . Snoring 10/29/2018  . Right-sided cerebrovascular accident (CVA) ([place]) 08/30/2018  . Non compliance w medication regimen 08/30/2018  . Atherosclerotic cerebrovascular disease 08/30/2018  . At high risk for stroke 08/30/2018  . Cerebral infarction ([place]) 07/09/2018  . Hyperlipidemia  07/08/2018  . Fall 07/08/2018  . Muscle weakness (generalized) 04/04/2018  . Fatigue 04/04/2018  . Diabetic retinopathy ([place]) 03/28/2018  . Type 2 diabetes mellitus ([place]) 12/09/2017  . Essential hypertension 12/09/2017  . Carotid artery disease ([place]) 12/17/2013    Orientation RESPIRATION BLADDER Height & Weight     Self  Tracheostomy (Trach placed 09/30/19. Shiley 57m uncuffed; 5L/min.) External catheter, Incontinent (Catheter placed 08/30/19) Weight: 154 lb 12.2 oz (70.2 kg) Height:  5' 1" (154.9 cm)  BEHAVIORAL SYMPTOMS/MOOD NEUROLOGICAL BOWEL NUTRITION STATUS      Continent Feeding tube (PEG tube placed 7/30. Tube size: 20 Fr; Location LUQ. Jevity 1.2 CAL-Liquid 1,000; Per Tube 50 mL/hr continuous)  AMBULATORY STATUS COMMUNICATION OF NEEDS Skin   Total Care Verbally Other (Comment) (MASD left/right groin)                       Personal Care Assistance Level of Assistance  Bathing, Feeding, Dressing Bathing Assistance: Maximum assistance Feeding assistance: Maximum assistance (Patient has PEG Tube, placed 10/10/19) Dressing Assistance: Maximum assistance     Functional Limitations Info  Sight, Hearing, Speech Sight Info: Impaired Hearing Info: Adequate Speech Info: Adequate    SPECIAL CARE FACTORS FREQUENCY  PT (By licensed PT), OT (By licensed OT)     PT Frequency: PT at SNF eval and treat, a minmum of 5 days per week OT Frequency: OT at SNF eval and treat, a minimum of 5 days per week            Contractures Contractures Info: Not present    Additional Factors Info  Code Status, Allergies, Insulin Sliding Scale Code Status Info: Full Allergies Info: No known allergies   Insulin Sliding Scale Info: 0-15 units every 4 hours       Current Medications (10/31/2019):  This is the current hospital active medication list Current Facility-Administered Medications  Medication Dose Route Frequency Provider Last Rate Last Admin  . acetaminophen (TYLENOL) tablet 650  mg  650 mg Oral Q4H PRN Luanne Bras, MD   650 mg at 10/24/19 1935   Or  . acetaminophen (TYLENOL) 160 MG/5ML solution 650 mg  650 mg Per Tube Q4H PRN Luanne Bras, MD   650 mg at 10/26/19 1046  . albuterol (PROVENTIL) (2.5 MG/3ML) 0.083% nebulizer solution 2.5 mg  2.5 mg Nebulization Q4H PRN Anders Simmonds, MD   2.5 mg at 10/28/19 0031  . amLODipine (NORVASC) tablet 10 mg  10 mg Per Tube Daily Jacky Kindle, MD   10 mg at 10/31/19 0857  . atorvastatin (LIPITOR) tablet 40 mg  40 mg Per NG tube Daily Jacky Kindle, MD   40 mg at 10/31/19 0857  . budesonide (PULMICORT) nebulizer solution 0.5 mg  0.5 mg Nebulization BID Wendee Beavers T, MD   0.5 mg at 10/31/19 0814  . chlorhexidine gluconate (MEDLINE KIT) (PERIDEX) 0.12 % solution 15 mL  15 mL Mouth Rinse BID Jacky Kindle, MD   15 mL at 10/31/19 0859  . Chlorhexidine Gluconate Cloth 2 % PADS 6 each  6 each Topical Daily Luanne Bras, MD   6 each at 10/30/19 2120  . famotidine (PEPCID) tablet 20 mg  20 mg Per Tube BID Kipp Brood, MD   20 mg at 10/31/19 0857  . feeding supplement (ENSURE ENLIVE) (ENSURE ENLIVE) liquid 237 mL  237 mL Oral BID BM Mariel Aloe, MD      . feeding supplement (JEVITY 1.5 CAL/FIBER) liquid 1,000 mL  1,000 mL Per Tube Q24H Edwin Dada, MD   1,000 mL at 10/30/19 1830  . feeding supplement (PROSource TF) liquid 45 mL  45 mL Per Tube TID Brand Males, MD   45 mL at 10/31/19 0858  . ferrous sulfate 300 (60 Fe) MG/5ML syrup 300 mg  300 mg Per Tube TID WC Gonfa, Taye T, MD   300 mg at 10/31/19 0900  . hydrALAZINE (APRESOLINE) tablet 100 mg  100 mg Per Tube Q6H Vann, Jessica U, DO   100 mg at 10/31/19 1228  . hydrocerin (EUCERIN) cream   Topical TID Geradine Girt, DO   Given at 10/31/19 4665  . insulin aspart (novoLOG) injection 0-9 Units  0-9 Units Subcutaneous Q4H Mariel Aloe, MD   3 Units at 10/31/19 [phone]  . insulin aspart (novoLOG) injection 5 Units  5 Units Subcutaneous Q4H Mercy Riding, MD   5 Units at 10/31/19 1221  . insulin glargine (LANTUS) injection 20 Units  20 Units Subcutaneous Daily Mercy Riding, MD   20 Units at 10/31/19 0855  . ipratropium-albuterol (DUONEB) 0.5-2.5 (3) MG/3ML nebulizer solution 3 mL  3 mL Nebulization Q4H PRN Wendee Beavers T, MD      . lip balm (CARMEX) ointment   Topical PRN Heath Lark D, DO   1 application at 70/17/79 0514  . MEDLINE mouth rinse  15 mL Mouth Rinse 10 times per day Jacky Kindle, MD   15 mL at 10/31/19 0900  . oxyCODONE (Oxy IR/ROXICODONE) immediate release tablet 5 mg  5 mg Per Tube Q6H PRN Brand Males, MD   5  mg at 10/28/19 2157  . QUEtiapine (SEROQUEL) tablet 50 mg  50 mg Per Tube QHS Cherene Altes, MD   50 mg at 10/30/19 2119  . Resource ThickenUp Clear   Oral PRN Danford, Suann Larry, MD         Discharge Medications: Please see discharge summary for a list of discharge medications.  Relevant Imaging Results:  Relevant Lab Results:   Additional Information [phone]. Patient has LUE Splint due to weakness/paralysis. Has LLE Ortho Boot;  Joanne Chars, LCSW

## 2019-10-31 NOTE — [place] FL2 (Signed)
[place] MEDICAID FL2 LEVEL OF CARE SCREENING TOOL     IDENTIFICATION  Patient Name: Donna [name] Birthdate: 1950-04-15 Sex: female Admission Date (Current Location): 08/25/2019  [facility] and [place] Number:  Herbalist and Address:  [facility], [place] [address] [place], [place] [address]      Provider Number: [id number]  Attending Physician Name and Address:  Mariel Aloe, MD  Relative Name and Phone Number:  Fayetta Sorenson - son; [phone]    Current Level of Care: Hospital Recommended Level of Care: [place] Prior Approval Number:    Date Approved/Denied:   PASRR Number:  (PASRR number pending)  Discharge Plan: SNF    Current Diagnoses: Patient Active Problem List   Diagnosis Date Noted  . Pressure injury of skin 10/17/2019  . Status post tracheostomy ([place])   . Hypokalemia   . Hypoxia   . Sepsis with acute hypoxic respiratory failure without septic shock ([place])   . Palliative care by specialist   . Goals of care, counseling/discussion   . Acute blood loss anemia   . Acute respiratory failure ([facility])   . AKI (acute kidney injury) ([place]) 08/26/2019  . Anemia 08/26/2019  . Internal carotid artery stenosis, right 08/25/2019  . Pain due to onychomycosis of toenails of both feet 08/06/2019  . Diabetic neuropathy ([place]) 08/06/2019  . Type 2 diabetes mellitus with vascular disease ([place]) 08/06/2019  . Statin myopathy 06/26/2019  . COVID-19 virus infection 03/27/2019  . Pre-operative cardiovascular examination 03/04/2019  . History of CVA (cerebrovascular accident) 03/04/2019  . PVD (peripheral vascular disease) ([place]) 03/04/2019  . Snoring 10/29/2018  . Right-sided cerebrovascular accident (CVA) ([place]) 08/30/2018  . Non compliance w medication regimen 08/30/2018  . Atherosclerotic cerebrovascular disease 08/30/2018  . At high risk for stroke 08/30/2018  . Cerebral infarction ([place]) 07/09/2018  . Hyperlipidemia  07/08/2018  . Fall 07/08/2018  . Muscle weakness (generalized) 04/04/2018  . Fatigue 04/04/2018  . Diabetic retinopathy ([place]) 03/28/2018  . Type 2 diabetes mellitus ([place]) 12/09/2017  . Essential hypertension 12/09/2017  . Carotid artery disease ([place]) 12/17/2013    Orientation RESPIRATION BLADDER Height & Weight     Self  Tracheostomy (Trach placed 09/30/19. [place]iley 55m uncuffed) Incontinent (Catheter placed 08/30/19) Weight: 154 lb 12.2 oz (70.2 kg) Height:  5' 1"  (154.9 cm)  BEHAVIORAL SYMPTOMS/MOOD NEUROLOGICAL BOWEL NUTRITION STATUS      Continent Feeding tube (see DC summary)  AMBULATORY STATUS COMMUNICATION OF NEEDS Skin   Total Care Verbally Other (Comment) (MASD left/right groin)                       Personal Care Assistance Level of Assistance  Bathing, Feeding, Dressing Bathing Assistance: Maximum assistance Feeding assistance: Maximum assistance (Patient has PEG Tube, placed 10/10/19) Dressing Assistance: Maximum assistance     Functional Limitations Info  Sight, Hearing, Speech Sight Info: Impaired Hearing Info: Adequate Speech Info: Adequate    SPECIAL CARE FACTORS FREQUENCY  PT (By licensed PT), OT (By licensed OT)     PT Frequency: PT at SNF eval and treat, a minmum of 5 days per week OT Frequency: OT at SNF eval and treat, a minimum of 5 days per week            Contractures Contractures Info: Not present    Additional Factors Info  Code Status, Allergies, Insulin Sliding Scale Code Status Info: Full Allergies Info: No known allergies   Insulin  Sliding Scale Info: 0-15 units every 4 hours       Current Medications (10/31/2019):  This is the current hospital active medication list Current Facility-Administered Medications  Medication Dose Route Frequency Provider Last Rate Last Admin  . acetaminophen (TYLENOL) tablet 650 mg  650 mg Oral Q4H PRN Luanne Bras, MD   650 mg at 10/24/19 1935   Or  . acetaminophen (TYLENOL) 160 MG/5ML  solution 650 mg  650 mg Per Tube Q4H PRN Luanne Bras, MD   650 mg at 10/26/19 1046  . albuterol (PROVENTIL) (2.5 MG/3ML) 0.083% nebulizer solution 2.5 mg  2.5 mg Nebulization Q4H PRN Anders Simmonds, MD   2.5 mg at 10/28/19 0031  . amLODipine (NORVASC) tablet 10 mg  10 mg Per Tube Daily Jacky Kindle, MD   10 mg at 10/31/19 0857  . atorvastatin (LIPITOR) tablet 40 mg  40 mg Per NG tube Daily Jacky Kindle, MD   40 mg at 10/31/19 0857  . budesonide (PULMICORT) nebulizer solution 0.5 mg  0.5 mg Nebulization BID Wendee Beavers T, MD   0.5 mg at 10/31/19 0814  . chlorhexidine gluconate (MEDLINE KIT) (PERIDEX) 0.12 % solution 15 mL  15 mL Mouth Rinse BID Jacky Kindle, MD   15 mL at 10/31/19 0859  . Chlorhexidine Gluconate Cloth 2 % PADS 6 each  6 each Topical Daily Luanne Bras, MD   6 each at 10/30/19 2120  . famotidine (PEPCID) tablet 20 mg  20 mg Per Tube BID Kipp Brood, MD   20 mg at 10/31/19 0857  . feeding supplement (ENSURE ENLIVE) (ENSURE ENLIVE) liquid 237 mL  237 mL Oral BID BM Mariel Aloe, MD      . feeding supplement (JEVITY 1.5 CAL/FIBER) liquid 1,000 mL  1,000 mL Per Tube Q24H Edwin Dada, MD   1,000 mL at 10/30/19 1830  . feeding supplement (PROSource TF) liquid 45 mL  45 mL Per Tube TID Brand Males, MD   45 mL at 10/31/19 0858  . ferrous sulfate 300 (60 Fe) MG/5ML syrup 300 mg  300 mg Per Tube TID WC Gonfa, Taye T, MD   300 mg at 10/31/19 0900  . hydrALAZINE (APRESOLINE) tablet 100 mg  100 mg Per Tube Q6H Vann, Jessica U, DO   100 mg at 10/31/19 1228  . hydrocerin (EUCERIN) cream   Topical TID Geradine Girt, DO   Given at 10/31/19 4562  . insulin aspart (novoLOG) injection 0-9 Units  0-9 Units Subcutaneous Q4H Mariel Aloe, MD   3 Units at 10/31/19 [phone]  . insulin aspart (novoLOG) injection 5 Units  5 Units Subcutaneous Q4H Mercy Riding, MD   5 Units at 10/31/19 1221  . insulin glargine (LANTUS) injection 20 Units  20 Units Subcutaneous Daily  Mercy Riding, MD   20 Units at 10/31/19 0855  . ipratropium-albuterol (DUONEB) 0.5-2.5 (3) MG/3ML nebulizer solution 3 mL  3 mL Nebulization Q4H PRN Wendee Beavers T, MD      . lip balm (CARMEX) ointment   Topical PRN Heath Lark D, DO   1 application at 93/73/42 0514  . MEDLINE mouth rinse  15 mL Mouth Rinse 10 times per day Jacky Kindle, MD   15 mL at 10/31/19 0900  . oxyCODONE (Oxy IR/ROXICODONE) immediate release tablet 5 mg  5 mg Per Tube Q6H PRN Brand Males, MD   5 mg at 10/28/19 2157  . QUEtiapine (SEROQUEL) tablet 50 mg  50 mg Per Tube QHS McClung,  Kimberlee Nearing, MD   50 mg at 10/30/19 2119  . Resource ThickenUp Clear   Oral PRN Danford, Suann Larry, MD         Discharge Medications: Please see discharge summary for a list of discharge medications.  Relevant Imaging Results:  Relevant Lab Results:   Additional Information [phone]. Patient has LUE Splint due to weakness/paralysis. Has LLE Ortho Boot;  [place], [place]

## 2019-10-31 NOTE — Discharge Summary (Addendum)
Physician Discharge Summary  MAREESA GATHRIGHT HBZ:[id number] DOB: [birth year] DOA: 08/25/2019  PCP: Minette Brine, FNP  Admit date: 08/25/2019 Discharge date: 10/17/2019  Admitted From: Home Disposition: SNF  Recommendations for Outpatient Follow-up:  1. Follow up with pulmonology for tracheostomy follow-up 2. Please follow up on the following pending results: None   Discharge Condition: Stable CODE STATUS: Full code Diet recommendation: Tube feeds/  Dysphagia 3 (mechanical soft);Thin liquid Liquids provided via: Cup;Straw Medication Administration: Whole meds with puree Supervision: Patient able to self feed;Intermittent supervision to cue for compensatory strategies Compensations: Slow rate;Small sips/bites;Clear throat intermittently Postural Changes and/or Swallow Maneuvers: Seated upright 90 degrees;Upright 30-60 min after meal     Brief/Interim Summary:  Admission HPI written by Gareth Eagle, PA-C   History of Present Illness: RENNE CORNICK is a 69 y.o. female who is well known to our service.  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  She is s/p revascularization using balloon angioplasty via right radial and right femoral approach6/14/2021by Dr. Estanislado Pandy.  She has acute blood loss anemia and is a Jehovah's Witness.  Current Hgb is 6.2.  Medical staff feels she is stable to undergo gastrostomy tube placement at this time.   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 Patient's course complicated by right thigh and retroperitoneal hematoma; patient is no longer on anticoagulation/antiplatelets for this reason. Currently stable. PT/OT recommending SNF.  Acute on chronic respiratory failure with hypoxia Tracheostomy Patient required intubation from 6/19-6/23 and from 6/23-6/28 with subsequent tracheostomy  for failed extubation. Currently stable on trach collar. Continue tracheostomy at discharge. Recommend pulmonology follow-up as an outpatient.  Patient may use Passy-Muir Speech Valve: During all waking hours (remove during sleep) PMSV Supervision: Intermittent  Acute metabolic encephalopathy In setting of above and has improved.   Dysphagia Secondary to above. PEG placed on 7/30. Initially failed multiple swallow studies and is now on a dysphagia 3 diet.  Acute blood loss anemia Hemorrhagic shock Hypotension No blood products secondary to patient's religious beliefs. Resolved. Patient given Epogen, Vitamin B12 and folate in addition to iron. Recommendations for no anticoagulation or antiplatelets.  Diabetes mellitus Patient is on insulin as an outpatient. Currently on tube feeds. Patient had an episode of hypoglycemia and insulin regimen was adjusted. Continue Lantus 20 units daily and Novolog 5 units q4 hours while on tube feeds.  Pressure injury Left/right sacrum, left/upper buttock, left/medial  buttock  Discharge Diagnoses:  Principal Problem:   Internal carotid artery stenosis, right Active Problems:   Essential hypertension   Hyperlipidemia   Cerebral infarction (HCC)   Type 2 diabetes mellitus with vascular disease ([place])   AKI (acute kidney injury) ([place])   Anemia   Acute respiratory failure (HCC)   Acute blood loss anemia   Palliative care by specialist   Goals of care, counseling/discussion   Hypoxia   Sepsis with acute hypoxic respiratory failure without septic shock ([place])   Hypokalemia   Status post tracheostomy ([place])   Pressure injury of skin    Discharge Instructions  Discharge Instructions    No wound care   Complete by: As directed      Allergies as of 10/31/2019   No Known Allergies     Medication List    STOP taking these medications   aspirin EC 81 MG tablet   carvedilol 6.25 MG tablet Commonly known as: COREG  [place] 100-33  UNT-MCG/ML Sopn Generic drug: Insulin Glargine-Lixisenatide   telmisartan-hydrochlorothiazide 40-12.5 MG tablet Commonly known as: MICARDIS HCT   ticagrelor 90 MG Tabs tablet Commonly known as: BRILINTA   Xultophy 100-3.6 UNIT-MG/ML Sopn Generic drug: Insulin Degludec-Liraglutide     TAKE these medications   amLODipine 10 MG tablet Commonly known as: NORVASC Take 1 tablet (10 mg total) by mouth daily.   atorvastatin 40 MG tablet Commonly known as: LIPITOR 1 tablet (40 mg total) by Per NG tube route daily. Start taking on: November 01, 2019   blood glucose meter kit and supplies Kit Dispense based on patient and insurance preference. Use up to four times daily as directed. (FOR ICD-9 250.00, 250.01).   budesonide 0.5 MG/2ML nebulizer solution Commonly known as: PULMICORT Take 2 mLs (0.5 mg total) by nebulization 2 (two) times daily.   CALCIUM-D PO Take 2 tablets by mouth daily at 12 noon.   famotidine 20 MG tablet Commonly known as: PEPCID Place 1 tablet (20 mg total) into feeding tube 2 (two) times daily.   feeding supplement (PROSource TF) liquid Place 45 mLs into feeding tube 3 (three) times daily.   feeding supplement (JEVITY 1.5 CAL/FIBER) Liqd Place 1,000 mLs into feeding tube daily.   ferrous sulfate 300 (60 Fe) MG/5ML syrup Place 5 mLs (300 mg total) into feeding tube 3 (three) times daily with meals.   hydrALAZINE 100 MG tablet Commonly known as: APRESOLINE Place 1 tablet (100 mg total) into feeding tube every 6 (six) hours. What changed:   medication strength  how much to take  how to take this  when to take this   insulin aspart 100 UNIT/ML injection Commonly known as: novoLOG Inject 5 Units into the skin every 4 (four) hours. For tube feeds   insulin glargine 100 UNIT/ML injection Commonly known as: LANTUS Inject 0.2 mLs (20 Units total) into the skin daily. Start taking on: November 01, 2019   MULTIVITAMIN PO Take 1 tablet by mouth daily at  12 noon.   OMEGA-3 PLUS PO Take 2 capsules by mouth daily at 12 noon.   QUEtiapine 50 MG tablet Commonly known as: SEROQUEL Place 1 tablet (50 mg total) into feeding tube at bedtime.   Repatha SureClick 301 MG/ML Soaj Generic drug: Evolocumab Inject 140 mg into the skin every 14 (fourteen) days.   True Metrix Blood Glucose Test test strip Generic drug: glucose blood Check blood sugar 4 times a day before meals and bedtime   True Metrix Meter w/Device Kit 1 each by Does not apply route in the morning, at noon, in the evening, and at bedtime.   vitamin C 250 MG tablet Commonly known as: ASCORBIC ACID Take 500 mg by mouth daily at 12 noon.       Contact information for after-discharge care    [facility] .   Service: Skilled Nursing Contact information: [place] [phone]                 No Known Allergies  Consultations:  PCCM  Interventional radiology   Procedures/Studies: DG Chest 1 View  Result Date: 10/02/2019 CLINICAL DATA:  Hypoxemia EXAM: CHEST  1 VIEW COMPARISON:  09/22/2019 FINDINGS: Feeding catheter, tracheostomy tube and right-sided PICC line are again noted stable. Cardiac shadow is stable. Aortic calcifications are again seen. Previously seen lung opacities have improved over the interval from the prior exam. No sizable effusion is noted. IMPRESSION: Improved  aeration bilaterally. Tubes and lines as described. Electronically Signed   By: Inez Catalina M.D.   On: 10/02/2019 11:37   CT ANGIO CHEST PE W OR WO CONTRAST  Result Date: 10/06/2019 CLINICAL DATA:  Respiratory distress. Evaluate for pulmonary embolism. Patient nonverbal. EXAM: CT ANGIOGRAPHY CHEST WITH CONTRAST TECHNIQUE: Multidetector CT imaging of the chest was performed using the standard protocol during bolus administration of intravenous contrast. Multiplanar CT image reconstructions and MIPs were obtained to evaluate  the vascular anatomy. CONTRAST:  47mL OMNIPAQUE IOHEXOL 350 MG/ML SOLN COMPARISON:  Chest radiograph 10/06/2019.  Cardiac CT of 04/16/2019. FINDINGS: Cardiovascular: The quality of this exam for evaluation of pulmonary embolism is moderate. Limitations include mild motion, suboptimal bolus timing, patient arm position (not raised above the head). No pulmonary embolism to the lobar level. Right PICC line tip at low SVC. Aortic atherosclerosis. Bovine arch. Mild cardiomegaly and left ventricular hypertrophy. Lad coronary artery calcification. Mediastinum/Nodes: Enlarged, heterogeneous thyroid, likely due to multinodular goiter. No dominant mass. Endotracheal tube terminates well above the carina. Precarinal node measures 1.3 cm on 39/6. Prominent nodal tissue along the peribronchovascular interstitium in both infrahilar regions, likely reactive. Nasogastric tube is incompletely imaged but followed to the level of the stomach. Tiny hiatal hernia. Lungs/Pleura: Tiny right pleural effusion. Bilateral upper lobe predominant areas of ground-glass and nodular pulmonary opacities. Some areas of mild septal thickening. No well-defined lobar consolidation. No dominant lung mass. Example areas of nodularity including in the left upper lobe at 9 mm on 31/7 and the left lower lobe at 6 mm on 53/7. Upper Abdomen: Normal imaged portions of the liver, spleen, pancreas, gallbladder, right adrenal gland, kidneys. Left adrenal thickening. Musculoskeletal: Right hemidiaphragm elevation. No acute osseous abnormality. Review of the MIP images confirms the above findings. IMPRESSION: 1. Moderate quality evaluation for pulmonary embolism. No pulmonary embolism with limitations above. 2. Bilateral upper lobe predominant areas of ground-glass and nodular pulmonary opacities. Favor atypical infection. 3. Tiny right pleural effusion. 4. Coronary artery atherosclerosis. Mild thoracic adenopathy, favored to be reactive. 5. Tiny hiatal hernia. 6.  Aortic Atherosclerosis (ICD10-I70.0). Electronically Signed   By: Abigail Miyamoto M.D.   On: 10/06/2019 14:42   IR GASTROSTOMY TUBE MOD SED  Result Date: 10/10/2019 INDICATION: Dysphagia. Please perform percutaneous gastrostomy tube placement for enteric nutrition supplementation purposes. EXAM: PULL TROUGH GASTROSTOMY TUBE PLACEMENT COMPARISON:  CT abdomen pelvis-08/27/2018 MEDICATIONS: Ancef 2 gm IV; Antibiotics were administered within 1 hour of the procedure. Glucagon 1 mg IV CONTRAST:  10 mL of Omnipaque 300 administered into the gastric lumen. ANESTHESIA/SEDATION: Moderate (conscious) sedation was employed during this procedure. A total of Versed 2 mg and Fentanyl 100 mcg was administered intravenously. Moderate Sedation Time: 20 minutes. The patient's level of consciousness and vital signs were monitored continuously by radiology nursing throughout the procedure under my direct supervision. FLUOROSCOPY TIME:  6 minutes, 6 seconds (69 mGy) COMPLICATIONS: None immediate. PROCEDURE: Informed written consent was obtained from patient's family (sister) following explanation of the procedure, risks, benefits and alternatives. A time out was performed prior to the initiation of the procedure. Ultrasound scanning was performed to demarcate the edge of the left lobe of the liver. Maximal barrier sterile technique utilized including caps, mask, sterile gowns, sterile gloves, large sterile drape, hand hygiene and Betadine prep. The left upper quadrant was sterilely prepped and draped. An oral gastric catheter was inserted into the stomach under fluoroscopy. The existing nasogastric feeding tube was removed. The left costal margin and air opacified transverse colon  were identified and avoided. Air was injected into the stomach for insufflation and visualization under fluoroscopy. Under sterile conditions a 17 gauge trocar needle was utilized to access the stomach percutaneously beneath the left subcostal margin after  the overlying soft tissues were anesthetized with 1% Lidocaine with epinephrine. Needle position was confirmed within the stomach with aspiration of air and injection of small amount of contrast. A single T tack was deployed for gastropexy. Over an Amplatz guide wire, a 9-French sheath was inserted into the stomach. A snare device was utilized to capture the oral gastric catheter. The snare device was pulled retrograde from the stomach up the esophagus and out the oropharynx. The 20-French pull-through gastrostomy was connected to the snare device and pulled antegrade through the oropharynx down the esophagus into the stomach and then through the percutaneous tract external to the patient. The gastrostomy was assembled externally. Contrast injection confirms position in the stomach. Several spot radiographic images were obtained in various obliquities for documentation. The patient tolerated procedure well without immediate post procedural complication. FINDINGS: After successful fluoroscopic guided placement, the gastrostomy tube is appropriately positioned with internal disc against the ventral aspect of the gastric lumen. IMPRESSION: Successful fluoroscopic insertion of a 20-French pull-through gastrostomy tube. The gastrostomy may be used immediately for medication administration and in 24 hrs for the initiation of feeds. Electronically Signed   By: Sandi Mariscal M.D.   On: 10/10/2019 16:02   DG CHEST PORT 1 VIEW  Result Date: 10/09/2019 CLINICAL DATA:  69 year old female status post PICC placement. EXAM: PORTABLE CHEST 1 VIEW COMPARISON:  Chest radiograph dated 10/06/2019. FINDINGS: Right-sided PICC with tip over central SVC. Tracheostomy remains above the carina. Feeding tube extends below the diaphragm with tip beyond the inferior margin of the image. Mild eventration of the right hemidiaphragm. There is no focal consolidation, pleural effusion, pneumothorax. Mild interstitial nodularity. The cardiac  silhouette is within limits. Atherosclerotic calcification of the aorta. Mild bilateral hilar prominence. No acute osseous pathology. IMPRESSION: 1. Right-sided PICC with tip over central SVC. 2. No focal consolidation. Electronically Signed   By: Anner Crete M.D.   On: 10/09/2019 17:13   DG Chest Port 1 View  Result Date: 10/06/2019 CLINICAL DATA:  Shortness of breath EXAM: PORTABLE CHEST 1 VIEW COMPARISON:  10/05/2019 FINDINGS: Stable positioning of tracheostomy tube. Right-sided PICC line in terminating at the distal SVC. Enteric tube courses below the diaphragm, distal tip extending beyond the inferior margin of the film. Stable cardiomediastinal contours. Atherosclerotic calcification of the aortic knob. No new focal airspace consolidation. No pleural effusion or pneumothorax. IMPRESSION: No acute cardiopulmonary findings. Stable lines and tubes, as above. Electronically Signed   By: Davina Poke D.O.   On: 10/06/2019 11:08   DG Chest Port 1 View  Result Date: 10/05/2019 CLINICAL DATA:  Acute respiratory distress EXAM: PORTABLE CHEST 1 VIEW COMPARISON:  10/02/2019 FINDINGS: Tracheostomy, right upper extremity PICC line, and nasoenteric feeding tube extending into the upper abdomen beyond the margin of the examination are unchanged. Lungs are clear. No pneumothorax or pleural effusion. Cardiac size within normal limits. Pulmonary vascularity is normal. IMPRESSION: Stable examination. No focal pulmonary infiltrate identified. Preserved pulmonary insufflation. Electronically Signed   By: Fidela Salisbury MD   On: 10/05/2019 22:16   DG Swallowing Func-Speech Pathology  Result Date: 10/15/2019 Objective Swallowing Evaluation: Type of Study: MBS-Modified Barium Swallow Study  Patient Details Name: ELISHA MCGRUDER MRN: [id number] Date of Birth: 23-Jul-1950 Today's Date: 10/15/2019 Time: SLP Start Time (ACUTE  ONLY): 1146 -SLP Stop Time (ACUTE ONLY): 1204 SLP Time Calculation (min) (ACUTE ONLY): 18 min Past  Medical History: Past Medical History: Diagnosis Date . Anemia   low iron - not since having fibroids removed . Arthritis  . Hyperlipidemia  . Hypertension  . Sleep apnea   just recently diagnosed with it, has not gotten the Cpap (done 10/29/18) . Stroke [facility])   weakness on left side . Type II diabetes mellitus ([place])  Past Surgical History: Past Surgical History: Procedure Laterality Date . COLONOSCOPY   . IR ANGIO INTRA EXTRACRAN SEL COM CAROTID INNOMINATE BILAT MOD SED  10/18/2018 . IR ANGIO INTRA EXTRACRAN SEL COM CAROTID INNOMINATE BILAT MOD SED  08/18/2019 . IR ANGIO VERTEBRAL SEL SUBCLAVIAN INNOMINATE UNI R MOD SED  08/18/2019 . IR ANGIO VERTEBRAL SEL VERTEBRAL BILAT MOD SED  10/18/2018 . IR ANGIO VERTEBRAL SEL VERTEBRAL UNI L MOD SED  08/18/2019 . IR GASTROSTOMY TUBE MOD SED  10/10/2019 . IR PTA INTRACRANIAL  08/25/2019 . IR US GUIDE VASC ACCESS RIGHT  10/18/2018 . IR US GUIDE VASC ACCESS RIGHT  08/18/2019 . IR US GUIDE VASC ACCESS RIGHT  08/25/2019 . MYOMECTOMY   . RADIOLOGY WITH ANESTHESIA N/A 08/18/2019  Procedure: STENTING;  Surgeon: Luanne Bras, MD;  Location: [place];  Service: Radiology;  Laterality: N/A; . RADIOLOGY WITH ANESTHESIA N/A 08/25/2019  Procedure: STENTING;  Surgeon: Luanne Bras, MD;  Location: [place];  Service: Radiology;  Laterality: N/A; HPI: 69 y.o. female with PMH significant for HTN, HLD, CVA 06/2018, DM, anemia, obesity, OSA, and OA. Pt underwent R ICA angioplasty on 6/14. Pt with decline in neuro status and hemoglobin on 6/15, found to have a large R groin hematoma.  Pt has had Hbg 5.0 or less and is a Jehovah's witness. MRI showed scattered small acute infarcts along the right MCA/watershed territory. Solitary small acute infarct in the left parietal cortex and right cerebellum. She was intubated on 6/19. Pt underwent tracheostomy on 7/3. PT found to be septic with RLL PNA on 7/6.  MBS 7/22 - started on dys1/nectars.  Patient now with a Shiley #6 cuffless trach in place.  She has planned PEG  placement 7/30.   Subjective: pt alert, upright in MBS chair, agreeable to evaluation Assessment / Plan / Recommendation CHL IP CLINICAL IMPRESSIONS 10/15/2019 Clinical Impression Pt demonstrated min-mild oropharyngeal dysphagia and improved from prior MBS. Orally she exhibited dfficulty maintaining bolus resulting in sublinual aggregation and spill to vallculae. Puree bolus reached his pyriform sinuses and remained for 5 seconds before swallow onset. Trace and mostly flash penetration with thin and nectar via straw. Smaller sips were effective however given oral impairments, cognitive deficits and respiratory compromise, recommend initiating nectar thick liquids. Mildly prolonged mastication with cracker. Recommend Dys 2, nectar thick liquids, pills whole in puree and full supervision/assist.      SLP Visit Diagnosis Dysphagia, oropharyngeal phase (R13.12) Attention and concentration deficit following -- Frontal lobe and executive function deficit following -- Impact on safety and function Mild aspiration risk;Moderate aspiration risk   CHL IP TREATMENT RECOMMENDATION 10/15/2019 Treatment Recommendations Therapy as outlined in treatment plan below   Prognosis 10/15/2019 Prognosis for Safe Diet Advancement Good Barriers to Reach Goals Cognitive deficits Barriers/Prognosis Comment -- CHL IP DIET RECOMMENDATION 10/15/2019 SLP Diet Recommendations Nectar thick liquid;Dysphagia 2 (Fine chop) solids Liquid Administration via Cup;Straw Medication Administration Crushed with puree Compensations Slow rate;Small sips/bites;Other (Comment);Clear throat intermittently Postural Changes Remain semi-upright after after feeds/meals (Comment)   CHL IP OTHER RECOMMENDATIONS 10/15/2019  Recommended Consults -- Oral Care Recommendations Oral care BID Other Recommendations --   CHL IP FOLLOW UP RECOMMENDATIONS 10/15/2019 Follow up Recommendations Skilled Nursing facility   [facility] IP FREQUENCY AND DURATION 10/15/2019 Speech Therapy Frequency (ACUTE ONLY)  min 2x/week Treatment Duration 2 weeks      CHL IP ORAL PHASE 10/15/2019 Oral Phase Impaired Oral - Pudding Teaspoon -- Oral - Pudding Cup -- Oral - Honey Teaspoon -- Oral - Honey Cup -- Oral - Nectar Teaspoon -- Oral - Nectar Cup Decreased bolus cohesion;Other (Comment) Oral - Nectar Straw Decreased bolus cohesion Oral - Thin Teaspoon -- Oral - Thin Cup Other (Comment);Decreased bolus cohesion Oral - Thin Straw Decreased bolus cohesion Oral - Puree Delayed oral transit Oral - Mech Soft -- Oral - Regular (No Data) Oral - Multi-Consistency -- Oral - Pill -- Oral Phase - Comment --  CHL IP PHARYNGEAL PHASE 10/15/2019 Pharyngeal Phase Impaired Pharyngeal- Pudding Teaspoon -- Pharyngeal -- Pharyngeal- Pudding Cup -- Pharyngeal -- Pharyngeal- Honey Teaspoon -- Pharyngeal -- Pharyngeal- Honey Cup -- Pharyngeal -- Pharyngeal- Nectar Teaspoon NT Pharyngeal -- Pharyngeal- Nectar Cup WFL Pharyngeal -- Pharyngeal- Nectar Straw Penetration/Aspiration during swallow Pharyngeal Material enters airway, remains ABOVE vocal cords then ejected out Pharyngeal- Thin Teaspoon NT Pharyngeal -- Pharyngeal- Thin Cup WFL Pharyngeal -- Pharyngeal- Thin Straw Penetration/Aspiration during swallow Pharyngeal Material enters airway, remains ABOVE vocal cords then ejected out;Material enters airway, remains ABOVE vocal cords and not ejected out Pharyngeal- Puree Delayed swallow initiation-vallecula Pharyngeal -- Pharyngeal- Mechanical Soft -- Pharyngeal -- Pharyngeal- Regular Pharyngeal residue - valleculae Pharyngeal -- Pharyngeal- Multi-consistency -- Pharyngeal -- Pharyngeal- Pill -- Pharyngeal -- Pharyngeal Comment --  CHL IP CERVICAL ESOPHAGEAL PHASE 10/15/2019 Cervical Esophageal Phase WFL Pudding Teaspoon -- Pudding Cup -- Honey Teaspoon -- Honey Cup -- Nectar Teaspoon -- Nectar Cup -- Nectar Straw -- Thin Teaspoon -- Thin Cup -- Thin Straw -- Puree -- Mechanical Soft -- Regular -- Multi-consistency -- Pill -- Cervical Esophageal Comment --  Houston Siren 10/15/2019, 1:24 PM  Orbie Pyo Litaker M.Ed Actor Pager [phone] Office [phone]             DG Swallowing Func-Speech Pathology  Result Date: 10/02/2019 Objective Swallowing Evaluation: Type of Study: Bedside Swallow Evaluation  Patient Details Name: SHANETA CERVENKA MRN: [id number] Date of Birth: 02-Mar-1951 Today's Date: 10/02/2019 Time: SLP Start Time (ACUTE ONLY): 1040 -SLP Stop Time (ACUTE ONLY): 1104 SLP Time Calculation (min) (ACUTE ONLY): 24 min Past Medical History: Past Medical History: Diagnosis Date . Anemia   low iron - not since having fibroids removed . Arthritis  . Hyperlipidemia  . Hypertension  . Sleep apnea   just recently diagnosed with it, has not gotten the Cpap (done 10/29/18) . Stroke [facility])   weakness on left side . Type II diabetes mellitus ([place])  Past Surgical History: Past Surgical History: Procedure Laterality Date . COLONOSCOPY   . IR ANGIO INTRA EXTRACRAN SEL COM CAROTID INNOMINATE BILAT MOD SED  10/18/2018 . IR ANGIO INTRA EXTRACRAN SEL COM CAROTID INNOMINATE BILAT MOD SED  08/18/2019 . IR ANGIO VERTEBRAL SEL SUBCLAVIAN INNOMINATE UNI R MOD SED  08/18/2019 . IR ANGIO VERTEBRAL SEL VERTEBRAL BILAT MOD SED  10/18/2018 . IR ANGIO VERTEBRAL SEL VERTEBRAL UNI L MOD SED  08/18/2019 . IR PTA INTRACRANIAL  08/25/2019 . IR US GUIDE VASC ACCESS RIGHT  10/18/2018 . IR US GUIDE VASC ACCESS RIGHT  08/18/2019 . IR US GUIDE VASC ACCESS RIGHT  08/25/2019 . MYOMECTOMY   .  RADIOLOGY WITH ANESTHESIA N/A 08/18/2019  Procedure: STENTING;  Surgeon: Luanne Bras, MD;  Location: [place];  Service: Radiology;  Laterality: N/A; . RADIOLOGY WITH ANESTHESIA N/A 08/25/2019  Procedure: STENTING;  Surgeon: Luanne Bras, MD;  Location: [place];  Service: Radiology;  Laterality: N/A; HPI: 68 y.o. female with PMH significant for HTN, HLD, CVA 06/2018, DM, anemia, obesity, OSA, and OA. Pt underwent R ICA angioplasty on 6/14. Pt with decline in neuro status and hemoglobin on 6/15,  found to have a large R groin hematoma.  Pt has had Hbg 5.0 or less and is a Jehovah's witness. MRI showed scattered small acute infarcts along the right MCA/watershed territory. Solitary small acute infarct in the left parietal cortex and right cerebellum. She was intubated on 6/19. Pt underwent tracheostomy on 7/3. PT found to be septic with RLL PNA on 7/6. As of 10/01/19, not tolerating PMSV, noted stridor and air stacking despite now being in Shiley #6 uncuffed. 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  Subjective: pt alert, upright in MBS chair, agreeable to evaluation Assessment / Plan / Recommendation CHL IP CLINICAL IMPRESSIONS 10/02/2019 Clinical Impression Ms. Koudelka demonstrates a mild pharyngeal dysphagia. She was noted to be fatigued after just having an x-ray completed and initially had high RR (32), which given time lowered to 18, more consistent with her baseline. Orally, all consistencies tested were WNL, however, pt deferred regular solid trials secondary to fatigue. Pharyngeally, pt was noted with reduced hyoid excursion, laryngeal elevation, and laryngeal vestibule closure, which resulted in trace deep penetration of thin liquids before the swallow. Oral prep phase was trialed as compensation, however, was ineffective. Pt was also unable to effectively utilize chin tuck strategy to prevent penetration. Given nectar thick liquids, she had no penetration or aspiration via cup or straw. Straw use may be preferred as pt requires full assist for feeding and straw allows her to control bolus size. She consumed purees with good pharyngeal clearance/adequate pharyngeal constriction. Esophageal phase could not be viewed due to patient positioning and size. Recommend: nectar thick liquids via straw, purees (dysphagia 1), slow rate of feeding, meds crushed in puree, ongoing dysphagia treatment SLP Visit Diagnosis Dysphagia, unspecified (R13.10) Impact on safety and function Mild  aspiration risk   CHL IP TREATMENT RECOMMENDATION 10/02/2019 Treatment Recommendations F/U FEES in --- days (Comment)   Prognosis 10/02/2019 Prognosis for Safe Diet Advancement Good Barriers to Reach Goals Cognitive deficits Barriers/Prognosis Comment -- [facility] IP DIET RECOMMENDATION 10/02/2019 SLP Diet Recommendations Nectar thick liquid;Dysphagia 1 (Puree) solids Liquid Administration via Cup;Straw Medication Administration Crushed with puree Compensations Small sips/bites Postural Changes Remain semi-upright after after feeds/meals (Comment);Seated upright at 90 degrees   CHL IP OTHER RECOMMENDATIONS 10/02/2019 Recommended Consults Consider ENT evaluation Oral Care Recommendations Oral care QID Other Recommendations Order thickener from pharmacy   CHL IP FOLLOW UP RECOMMENDATIONS 10/02/2019 Follow up Recommendations Skilled Nursing facility;LTACH;24 hour supervision/assistance   CHL IP FREQUENCY AND DURATION 10/02/2019 Speech Therapy Frequency (ACUTE ONLY) min 2x/week Treatment Duration 2 weeks      [facility]L IP ORAL PHASE 10/02/2019 Oral Phase WFL  [facility] IP PHARYNGEAL PHASE 10/02/2019 Pharyngeal Phase Impaired Pharyngeal- Nectar Teaspoon WFL Pharyngeal -- Pharyngeal- Nectar Cup WFL Pharyngeal -- Pharyngeal- Nectar Straw WFL Pharyngeal -- Pharyngeal- Thin Teaspoon Reduced epiglottic inversion;Reduced laryngeal elevation;Reduced airway/laryngeal closure;Penetration/Aspiration before swallow Pharyngeal -- Pharyngeal- Thin Cup Reduced epiglottic inversion;Reduced laryngeal elevation;Reduced anterior laryngeal mobility;Reduced airway/laryngeal closure;Penetration/Aspiration before swallow Pharyngeal -- Pharyngeal- Thin Straw Reduced epiglottic inversion;Reduced anterior laryngeal mobility;Reduced laryngeal elevation;Reduced airway/laryngeal  closure;Penetration/Aspiration before swallow Pharyngeal -- Pharyngeal- Puree WFL  CHL IP CERVICAL ESOPHAGEAL PHASE 10/02/2019 Cervical Esophageal Phase [facility] P. Isenhour, M.S., CCC-SLP  Speech-Language Pathologist Acute Rehabilitation Services Pager: [place] 10/02/2019, 11:32 AM                Subjective: No concerns.  Discharge Exam: Vitals:   10/31/19 1132 10/31/19 1137  BP:    Pulse: 100 100  Resp:  16  Temp:    SpO2:  96%   Vitals:   10/31/19 0814 10/31/19 0846 10/31/19 1132 10/31/19 1137  BP:  (!) 156/64    Pulse: (!) 102 (!) 101 100 100  Resp: _0 Temp:  98.9 F (37.2 C)    TempSrc:  Oral    SpO2: 97% 96%  96%  Weight:      Height:        General: Pt is alert, awake, not in acute distress Cardiovascular: RRR, S1/S2 +, no rubs, no gallops Respiratory: CTA bilaterally, no wheezing, no rhonchi Abdominal: Soft, NT, ND, bowel sounds + Extremities: no edema, no cyanosis    The results of significant diagnostics from this hospitalization (including imaging, microbiology, ancillary and laboratory) are listed below for reference.     Microbiology: No results found for this or any previous visit (from the past 240 hour(s)).   Labs: BNP (last 3 results) Recent Labs    08/31/19 0216 10/06/19 1155  BNP 800.0* 154.0*   Basic Metabolic Panel: No results for input(s): NA, K, CL, CO2, GLUCOSE, BUN, CREATININE, CALCIUM, MG, PHOS in the last 168 hours. Liver Function Tests: No results for input(s): AST, ALT, ALKPHOS, BILITOT, PROT, ALBUMIN in the last 168 hours. No results for input(s): LIPASE, AMYLASE in the last 168 hours. No results for input(s): AMMONIA in the last 168 hours. CBC: Recent Labs  Lab 10/27/19 0117  WBC 6.1  HGB 10.3*  HCT 35.0*  MCV 95.6  PLT 300   Cardiac Enzymes: No results for input(s): CKTOTAL, CKMB, CKMBINDEX, TROPONINI in the last 168 hours. BNP: Invalid input(s): POCBNP CBG: Recent Labs  Lab 10/30/19 1923 10/31/19 0008 10/31/19 0400 10/31/19 0735 10/31/19 1159  GLUCAP 111* 219* 208* 225* 120*   D-Dimer No results for input(s): DDIMER in the last 72 hours. Hgb A1c No results  for input(s): HGBA1C in the last 72 hours. Lipid Profile No results for input(s): CHOL, HDL, LDLCALC, TRIG, CHOLHDL, LDLDIRECT in the last 72 hours. Thyroid function studies No results for input(s): TSH, T4TOTAL, T3FREE, THYROIDAB in the last 72 hours.  Invalid input(s): FREET3 Anemia work up No results for input(s): VITAMINB12, FOLATE, FERRITIN, TIBC, IRON, RETICCTPCT in the last 72 hours. Urinalysis    Component Value Date/Time   COLORURINE AMBER (A) 09/16/2019 0931   APPEARANCEUR HAZY (A) 09/16/2019 0931   LABSPEC 1.021 09/16/2019 0931   PHURINE 5.0 09/16/2019 0931   GLUCOSEU NEGATIVE 09/16/2019 0931   HGBUR NEGATIVE 09/16/2019 0931   BILIRUBINUR NEGATIVE 09/16/2019 0931   BILIRUBINUR negative 01/02/2018 1636   KETONESUR NEGATIVE 09/16/2019 0931   PROTEINUR 30 (A) 09/16/2019 0931   UROBILINOGEN 0.2 01/02/2018 1636   NITRITE NEGATIVE 09/16/2019 0931   LEUKOCYTESUR SMALL (A) 09/16/2019 0931   Sepsis Labs Invalid input(s): PROCALCITONIN,  WBC,  LACTICIDVEN Microbiology No results found for this or any previous visit (from the past 240 hour(s)).   Time coordinating discharge: 35 minutes  SIGNED:   Cordelia Poche, MD [facility] 10/31/2019, 12:06 PM

## 2019-10-31 NOTE — Progress Notes (Signed)
Nutrition Follow-up  DOCUMENTATION CODES:   Not applicable  INTERVENTION:   Continue nocturnal tube feeding: - Jevity 1.5 @ 70 ml/hr x 14 hrs (6pm-8am) via PEG - ProSource TF 45 ml TID  Tube feeding regimen provides 1590 kcal, 96 grams of protein, and 745 ml of H2O. Meets 100% of needs.   Add Ensure Enlive po BID, each supplement provides 350 kcal and 20 grams of protein  NUTRITION DIAGNOSIS:   Inadequate oral intake related to inability to eat as evidenced by NPO status.  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  6/14 - s/p balloon angioplasty for severe stenosis, pt developed lethargy and increased L sided weakness found to have new watershed infarcts, started on vasopressors for cerebral perfusion 6/16 - cortrak placed, tip in stomach 6/17 - pt with increased agitation requiring increased O2 6/18 - pt pulled out Cortrak, Cortrak replaced 6/19 - Cortrak removed 6/20 - intubated 6/23 - Cortrak replaced (tip gastric per Cortrak team), extubated, later reintubated 7/3 - s/p trach  7/30- s/p PEG  Advanced to DYS 3 with thin liquids on 8/17. Meal completions lack documentation over the last week. Per tech, intake varies. Has 25% of breakfast this am. Had episode of hypoglycemia yesterday morning. SS adjusted. Continue nocturnal tube feeding until PO intake meets 75% of needs consistently.   Awaiting SNF placement.   Admission weight: 77.5 kg  Current weight: 70.2 kg (stable over the last week)  Medications: SS novolog, lantus Labs: CBG  [id number]  Diet Order:   Diet Order            DIET DYS 3 Room service appropriate? Yes with Assist; Fluid consistency: Thin  Diet effective now                 EDUCATION NEEDS:   Not appropriate for education at this time  Skin:  Skin Assessment: Skin Integrity Issues: Stage II: sacrum, buttocks x2 Incisions: right groin  Last BM:  8/19  Height:   Ht Readings from Last 1 Encounters:  08/31/19 5\' 1"  (1.549 m)    Weight:   Wt Readings from Last 1 Encounters:  10/28/19 70.2 kg    Ideal Body Weight:  47.7 kg  BMI:  Body mass index is 29.24 kg/m.  Estimated Nutritional Needs:   Kcal:  1550-1750  Protein:  90-105 grams  Fluid:  >1.6 L/day  10/30/19 RD, LDN Clinical Nutrition Pager listed in [facility]

## 2019-11-01 DIAGNOSIS — I469 Cardiac arrest, cause unspecified: Secondary | ICD-10-CM | POA: Diagnosis not present

## 2019-11-01 DIAGNOSIS — E119 Type 2 diabetes mellitus without complications: Secondary | ICD-10-CM | POA: Diagnosis not present

## 2019-11-01 DIAGNOSIS — I10 Essential (primary) hypertension: Secondary | ICD-10-CM | POA: Diagnosis not present

## 2019-11-01 DIAGNOSIS — Z79899 Other long term (current) drug therapy: Secondary | ICD-10-CM | POA: Diagnosis not present

## 2019-11-01 DIAGNOSIS — Z794 Long term (current) use of insulin: Secondary | ICD-10-CM | POA: Diagnosis not present

## 2019-11-01 DIAGNOSIS — Z8673 Personal history of transient ischemic attack (TIA), and cerebral infarction without residual deficits: Secondary | ICD-10-CM | POA: Diagnosis not present

## 2019-11-01 LAB — GLUCOSE, CAPILLARY
Glucose-Capillary: 177 mg/dL — ABNORMAL HIGH (ref 70–99)
Glucose-Capillary: 232 mg/dL — ABNORMAL HIGH (ref 70–99)
Glucose-Capillary: 117 mg/dL — ABNORMAL HIGH (ref 70–99)

## 2019-11-03 ENCOUNTER — Ambulatory Visit: Payer: Medicare HMO

## 2019-11-03 DIAGNOSIS — I6350 Cerebral infarction due to unspecified occlusion or stenosis of unspecified cerebral artery: Secondary | ICD-10-CM

## 2019-11-03 DIAGNOSIS — E11319 Type 2 diabetes mellitus with unspecified diabetic retinopathy without macular edema: Secondary | ICD-10-CM

## 2019-11-03 NOTE — Chronic Care Management (AMB) (Signed)
  Care Management    Consult Note  11/03/2019 Name: Donna [name] MRN: [id number] DOB: 12-29-1950  Care management team received notification of patient's recent inpatient hospitalization related to hypokalemia and cerebral infarction .Based on review of health record, Donna [name] is currently active in the embedded care coordination program.   Review of patient status, including review of consultants reports, relevant laboratory and other test results, and collaboration with appropriate care team members and the patient's provider was performed as part of comprehensive patient evaluation and provision of chronic care management services.    SW reviewed chart to note patient discharged to [facility] for long-term SNF stay. SW collaborated with embedded care management team to inform of patient disposition.    Plan: No SW follow up planned at this time. The patient chart will be reviewed by each discipline to determine patient ability to engage with care management program.  Bevelyn Ngo, BSW, CDP Social Worker, Certified Dementia Practitioner [facility] Care Management [phone]

## 2019-11-04 ENCOUNTER — Ambulatory Visit: Payer: Medicare HMO | Admitting: Podiatry

## 2019-11-12 NOTE — Progress Notes (Signed)
I have called the Donna [name] 4x this morning, with no response

## 2019-11-12 NOTE — TOC Transition Note (Signed)
Transition of Care [facility]) - CM/SW Discharge Note   Patient Details  Name: [name] MRN: [id number] Date of Birth: [birth year]  [facility] CM/SW Contact:  Jimmy Picket, LCSWA Phone Number: 11/14/2019, 10:09 AM   Clinical Narrative:     Pt going to room 204A.  RN call report to [phone].  Final next level of care: Skilled Nursing Facility Barriers to Discharge: Barriers Resolved   Patient Goals and CMS Choice Patient states their goals for this hospitalization and ongoing recovery are:: SNF CMS Medicare.gov Compare Post Acute Care list provided to:: Other (Comment Required) Choice offered to / list presented to : Adult Children  Discharge Placement              Patient chooses bed at: [facility] Patient to be transferred to facility by: [facility] Name of family member notified: Scarlette Calico, sister Patient and family notified of of transfer: 10/31/19  Discharge Plan and Services                                     Social Determinants of Health (SDOH) Interventions     Readmission Risk Interventions Readmission Risk Prevention Plan 10/31/2019  Transportation Screening Complete  Medication Review (RN Care Manager) Complete  PCP or Specialist appointment within 3-5 days of discharge Not Complete  PCP/Specialist Appt Not Complete comments going to SNF  HRI or Home Care Consult Not Complete  HRI or Home Care Consult Pt Refusal Comments going to SNF  SW Recovery Care/Counseling Consult Complete  Palliative Care Screening Not Applicable  Skilled Nursing Facility Complete  Some recent data might be hidden    Jimmy Picket, Theresia Majors, [place] Clinical Social Worker [phone]

## 2019-11-12 NOTE — Progress Notes (Signed)
Patient stable for discharge and transfer to SNF.  Jacquelin Hawking, MD [facility] 2019/11/26, 8:58 AM

## 2019-11-12 DEATH — deceased

## 2019-11-13 ENCOUNTER — Other Ambulatory Visit: Payer: Medicare HMO

## 2019-11-14 ENCOUNTER — Telehealth: Payer: Medicare HMO

## 2019-11-14 ENCOUNTER — Other Ambulatory Visit: Payer: Self-pay

## 2019-11-14 ENCOUNTER — Ambulatory Visit: Payer: Self-pay

## 2019-11-14 DIAGNOSIS — E11319 Type 2 diabetes mellitus with unspecified diabetic retinopathy without macular edema: Secondary | ICD-10-CM

## 2019-11-14 DIAGNOSIS — Z794 Long term (current) use of insulin: Secondary | ICD-10-CM

## 2019-11-14 DIAGNOSIS — I10 Essential (primary) hypertension: Secondary | ICD-10-CM

## 2019-11-14 DIAGNOSIS — E785 Hyperlipidemia, unspecified: Secondary | ICD-10-CM

## 2019-11-14 NOTE — Chronic Care Management (AMB) (Signed)
Chronic Care Management   Follow Up Note   11/14/2019 Name: Donna [name] MRN: [id number] DOB: 10/09/1950  Referred by: Donna Brine, FNP Reason for referral : Chronic Care Management (CCM Case Closure)   Donna [name] is a 69 y.o. year old female who is a primary care patient of Donna [name], [place]. The CCM team was consulted for assistance with chronic disease management and care coordination needs.    Review of patient status, including review of consultants reports, relevant laboratory and other test results, and collaboration with appropriate care team members and the patient's provider was performed as part of comprehensive patient evaluation and provision of chronic care management services.    Per review of chart noted patient discharged to [facility] for long-term SNF stay. Patient closed to CCM at this time.   Outpatient Encounter Medications as of 11/14/2019  Medication Sig Note  . amLODipine (NORVASC) 10 MG tablet Take 1 tablet (10 mg total) by mouth daily.   Marland Kitchen atorvastatin (LIPITOR) 40 MG tablet 1 tablet (40 mg total) by Per NG tube route daily.   . blood glucose meter kit and supplies KIT Dispense based on patient and insurance preference. Use up to four times daily as directed. (FOR ICD-9 250.00, 250.01). 06/13/2018: Bought ReliON meter from [employer]    . Blood Glucose Monitoring Suppl (TRUE METRIX METER) w/Device KIT 1 each by Does not apply route in the morning, at noon, in the evening, and at bedtime.   . budesonide (PULMICORT) 0.5 MG/2ML nebulizer solution Take 2 mLs (0.5 mg total) by nebulization 2 (two) times daily.   . Calcium Carbonate-Vitamin D (CALCIUM-D PO) Take 2 tablets by mouth daily at 12 noon.    . Evolocumab (REPATHA SURECLICK) 681 MG/ML SOAJ Inject 140 mg into the skin every 14 (fourteen) days. 08/06/2019: Pt has not started med   . famotidine (PEPCID) 20 MG tablet Place 1 tablet (20 mg total) into feeding tube 2 (two) times daily.   . ferrous  sulfate 300 (60 Fe) MG/5ML syrup Place 5 mLs (300 mg total) into feeding tube 3 (three) times daily with meals.   Marland Kitchen glucose blood (TRUE METRIX BLOOD GLUCOSE TEST) test strip Check blood sugar 4 times a day before meals and bedtime   . hydrALAZINE (APRESOLINE) 100 MG tablet Place 1 tablet (100 mg total) into feeding tube every 6 (six) hours.   . insulin aspart (NOVOLOG) 100 UNIT/ML injection Inject 5 Units into the skin every 4 (four) hours. For tube feeds   . insulin glargine (LANTUS) 100 UNIT/ML injection Inject 0.2 mLs (20 Units total) into the skin daily.   . Multiple Vitamin (MULTIVITAMIN PO) Take 1 tablet by mouth daily at 12 noon.    . Nutritional Supplements (FEEDING SUPPLEMENT, JEVITY 1.5 CAL/FIBER,) LIQD Place 1,000 mLs into feeding tube daily.   . Nutritional Supplements (FEEDING SUPPLEMENT, PROSOURCE TF,) liquid Place 45 mLs into feeding tube 3 (three) times daily.   . Omega-3 Fatty Acids (OMEGA-3 PLUS PO) Take 2 capsules by mouth daily at 12 noon.   . QUEtiapine (SEROQUEL) 50 MG tablet Place 1 tablet (50 mg total) into feeding tube at bedtime.   . vitamin C (ASCORBIC ACID) 250 MG tablet Take 500 mg by mouth daily at 12 noon.    No facility-administered encounter medications on file as of 11/14/2019.     Goals Addressed      Patient Stated   .  COMPLETED: "I have arthritis pain in my hands, knees and  legs" (pt-stated)        Current Barriers:  Marland Kitchen Knowledge Deficits related to diagnosis and treatment management for arthritis . Impaired Physical Mobility  . Impaired Dexterity   Nurse Case Manager Clinical Goal(s):  Marland Kitchen Over the next 60 days, patient will work with CCM RN CM and PCP to address needs related to arthritis pain management  . Over the next 60 days, patient will work with in home PT to establish a safe and effective HEP to help improve physical mobility, ROM, balance, strengthening and overall stamina as evidence by patient will have increased ability to perform Self Care.     CCM RN CM Interventions:  11/14/19 "SW reviewed chart to note patient discharged to [facility] for long-term SNF stay. SW collaborated with embedded care management team to inform of patient disposition"  Patient Self Care Activities with son's assistance  . Self administers medications as prescribed . Attends all scheduled provider appointments . Calls pharmacy for medication refills . Performs ADL's independently . Performs IADL's independently . Calls provider office for new concerns or questions  Please see past updates related to this goal by clicking on the "Past Updates" button in the selected goal     .  COMPLETED: "I know I need some help with my sugar" (pt-stated)        Current Barriers:  Marland Kitchen Knowledge Deficits related to long term plan for management of DM . Knowledge Deficits related to recommended diet/exercise plan . Non-adherence to Self monitoring CBG's  Nurse Case Manager Clinical Goal(s): Over the next 90 days, patient will work with [facility] to establish long term plan of care and self health management strategies for DM disease management. Goal Not Met . 12/18/18: Over the next 90 days, patient will work with the CCM team to develop a Diabetes disease management plan to improve her knowledge and understanding of how to improve Self Health management of DM Goal Not Met . New 05/30/19 Over the next 90 days, patient will achieve improved daily glycemic control with FBS 80-130, after meals <190 and lower her A1C < 11.2  CCM RN CM Interventions:  11/14/19 "SW reviewed chart to note patient discharged to [facility] for long-term SNF stay. SW collaborated with embedded care management team to inform of patient disposition"  Patient Self Care Activities with son and sister's assistance . Self administers medications as prescribed . Attends all scheduled provider appointments . Calls pharmacy for medication refills . Attends church or other social  activities . Performs ADL's independently . Performs IADL's independently . Calls provider office for new concerns or questions  Please see past updates related to this goal by clicking on the "Past Updates" button in the selected goal     .  COMPLETED: "I'll have my son check my BP" (pt-stated)        Current Barriers:  Marland Kitchen Knowledge Deficits related to Hypertension disease process, potential complications related to uncontrolled BP and importance of Self monitoring BP   Nurse Case Manager Clinical Goal(s):  Marland Kitchen Over the next 90 days, patient will verbalize understanding of plan for having son assist with Self monitoring her BP at home and logging her readings  Goal Met . New - 01/14/19 Over the next 14 days, patient will meet with the [facility] staff and or embedded Pharm D to have home BP cuff checked for accuracy Goal Met New 05/30/19 Over the next 90 days, patient will maintain blood pressure at less than 130/80 mm/Hg with  lifestyle modifications, and medications as directed by prescribing MD  CCM RN CM Interventions:  11/14/19 "SW reviewed chart to note patient discharged to [facility] for long-term SNF stay. SW collaborated with embedded care management team to inform of patient disposition"  Patient Self Care Activities with son's assistance . Self administers medications as prescribed . Attends all scheduled provider appointments . Calls pharmacy for medication refills . Performs ADL's independently . Performs IADL's independently . Calls provider office for new concerns or questions  Please see past updates related to this goal by clicking on the "Past Updates" button in the selected goal     .  COMPLETED: I need help paying for my medications (pt-stated)        Current Barriers:  . Financial Barriers: patient has [employer] and reports copay for [employer] ($134/month) is cost prohibitive at this time  Pharmacist Clinical Goal(s):  Marland Kitchen Over the next 30 days, patient  will work with PharmD and providers to relieve medication access concerns  Interventions: . Comprehensive medication review completed; medication list updated in electronic medical record.  Laurel Dimmer by [employer]: Patient meets income/No out of pocket spend criteria for this medication's patient assistance program. Reviewed application process. Patient will provide proof of income, out of pocket spend report, and will sign application. Will collaborate with primary care provider, Donna Brine, DNP, FNP for their portion of application. Once completed, will submit to [employer] patient assistance program. . 03/12/19: Patient states she is awaiting letter from [facility] in order to submit form.  Encouraged pt to bring in next week.  CCM RN CM Interventions: 05/30/19 call completed with patient . Instructed patient to provide the embedded Pharm D of her SS benefit letter and or recent bank statement printed on her banks letter head with the most recent SS deposit noted . Discussed once this has been completed her application for financial assistance for [facility] by [employer] can be completed; patient verbalizes understanding  and plans to gather this information asap  Patient Self Care Activities:  . Patient will provide necessary portions of application   Please see past updates related to this goal by clicking on the "Past Updates" button in the selected goal       .  COMPLETED: I need to control & monitor my blood sugar (pt-stated)        Current Barriers:  . Diabetes: T2DM; most recent A1c 11.3% today (has stayed elevated)   . Current antihyperglycemic regimen: Soliqua 25 units daily (reports compliance) o Will have to apply for [facility] given ease of patient assistance program ( no out of pocket requirements)--application completed, awaiting patient's financial documents--discussed w/ PCP o Patient appears to have compliance and access to medication issues.   o Case discussed with CCM RN CM and CCM BSW for  additional collaboration . Denies hypoglycemic symptoms . Reports hyperglycemic symptoms: headaches, polydipsia . Current meal patterns: son is cooking lower carbohydrate meals o Drinks: sugary drinks, trying to drink more water . Current exercise: trying to walk . Current blood glucose readings: FBG 170-200s . Cardiovascular risk reduction: o Current hypertensive regimen: amlodipine (now taking), carvedilol, telmisartan/hctz o Current hyperlipidemia regimen: n/a-->statin intolerance (appt with lipid clinic to arrange Repatha--no appt noted past or future) o Reinforce importance of daily aspirin.  Patient not taking as presribed.  She is now taking Brilinta.  She reports that she has "failed Plavix" (noted in clinical documentation as well)  Pharmacist Clinical Goal(s):  Marland Kitchen Over the next 90 days,  patient with work with PharmD and primary care provider to address optimized medication & disease state management (diabetes).  Interventions: . Comprehensive medication review performed, medication list updated in electronic medical record o Follow ADA recommended "diabetes-friendly" diet  (reviewed healthy snack/food options).  Mailed diabetes materials and snack food options.  Suggested low/zero sugar drinks for patient to try.  She states she is having trouble drinking water. o Discussed insulin/GLP-1 injection technique/instructions. o Reviewed medication purpose/side effects-->patient denies adverse events, denies hypoglycemia  Patient Self Care Activities:  . Patient will check blood glucose daily, document, and provide at future appointments . Patient will focus on medication adherence by taking all medications as prescribed. . Patient will take medications as prescribed . Patient will contact provider with any episodes of hypoglycemia . Patient will report any questions or concerns to provider   Please see past updates related to this goal by clicking on the "Past Updates" button in the  selected goal        Plan:   No further follow up required  Barb Merino, RN, BSN, [place] Management Coordinator [facility]  Direct Phone: [phone]

## 2019-12-15 ENCOUNTER — Telehealth: Payer: Self-pay

## 2019-12-15 NOTE — Telephone Encounter (Signed)
CALLED AND UNABLE TO LMOM TO REQUEST NEW INSURANCE AS PT DOES NOT HAVE VOICEMAIL SET UP

## 2020-01-13 ENCOUNTER — Ambulatory Visit: Payer: Medicare HMO

## 2020-01-13 ENCOUNTER — Ambulatory Visit: Payer: Medicare HMO | Admitting: Nurse Practitioner

## 2020-01-14 ENCOUNTER — Encounter: Payer: Medicare HMO | Admitting: Nurse Practitioner

## 2020-01-14 ENCOUNTER — Telehealth: Payer: Self-pay

## 2020-01-14 ENCOUNTER — Ambulatory Visit: Payer: Medicare HMO

## 2020-01-14 NOTE — Telephone Encounter (Signed)
This nurse attempted to call patient in regards to missed appointments. First number listed was not in service. Second number did not have a voicemail to leave a message.

## 2020-02-10 ENCOUNTER — Telehealth: Payer: Self-pay

## 2020-02-10 NOTE — Progress Notes (Signed)
Patient resides in a long term care facility

## 2020-02-10 NOTE — Telephone Encounter (Signed)
I called the pt to reschedule AWV and OV.  Patient's son said she passed away months ago.  I apologized and told him I would make a note.

## 2020-03-31 NOTE — Telephone Encounter (Signed)
Na

## 2020-04-08 ENCOUNTER — Telehealth: Payer: Self-pay | Admitting: Nurse Practitioner

## 2020-04-08 NOTE — Telephone Encounter (Signed)
Left message for patient to call back and schedule Medicare Annual Wellness Visit (AWV)   Last AWV 01/08/2019  please schedule at anytime with [facility]    This should be a 45 minute visit.

## 2021-05-23 IMAGING — CT CT ABD-PELV W/O CM
2 of 8 series · 13 of 46 positions shown, 18 images · non-contrast
Comparison: None.

CLINICAL DATA: Abdominal distension.

EXAM:
CT ABDOMEN AND PELVIS WITHOUT CONTRAST
TECHNIQUE: Multidetector CT imaging of the abdomen and pelvis was performed
following the standard protocol without IV contrast.

[Series 3: a/p w/o 5mm · axial · non-contrast · 0.86mm/px · z∈[-709,-369]mm · 10 of 82 slices shown, 15 images]
[im 7/82  soft-tissue]
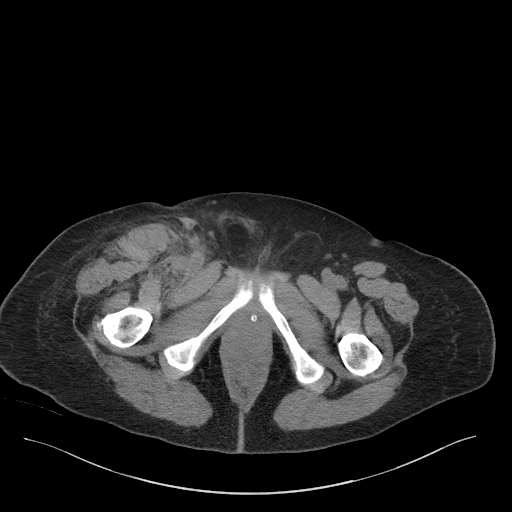
[im 7/82  bone]
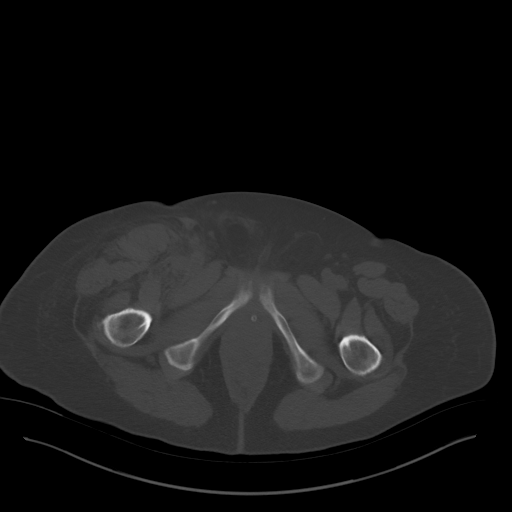
[im 19/82  soft-tissue]
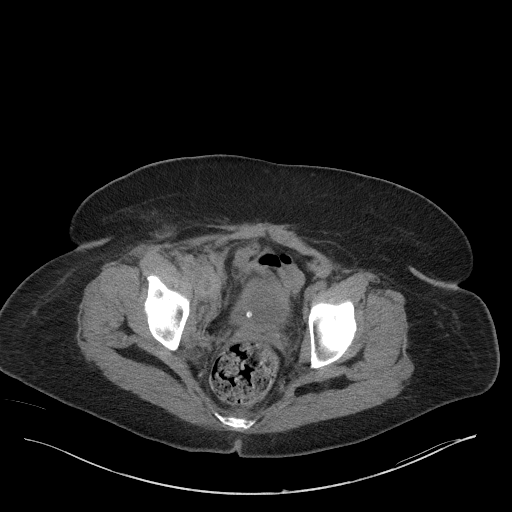
[im 25/82  soft-tissue]
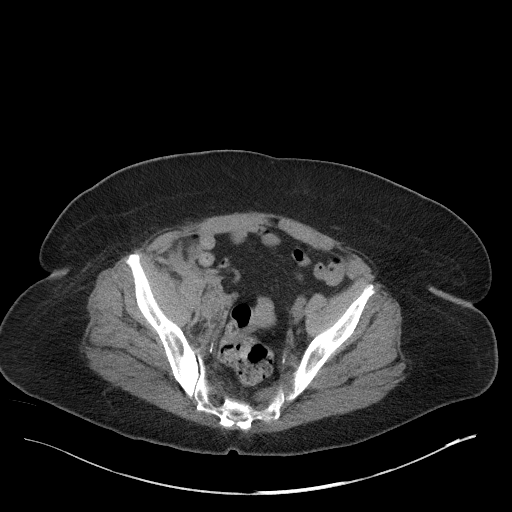
[im 32/82  soft-tissue]
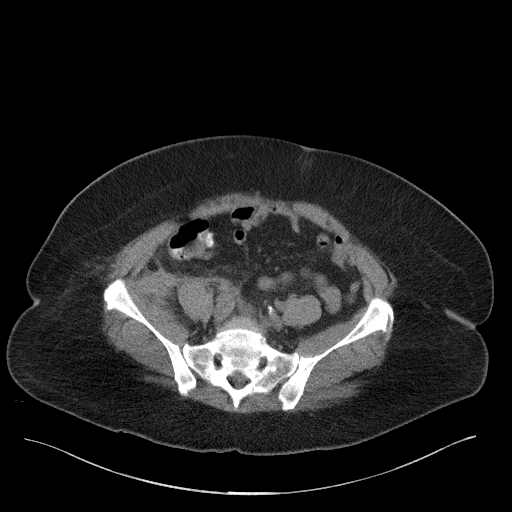
[im 44/82  soft-tissue]
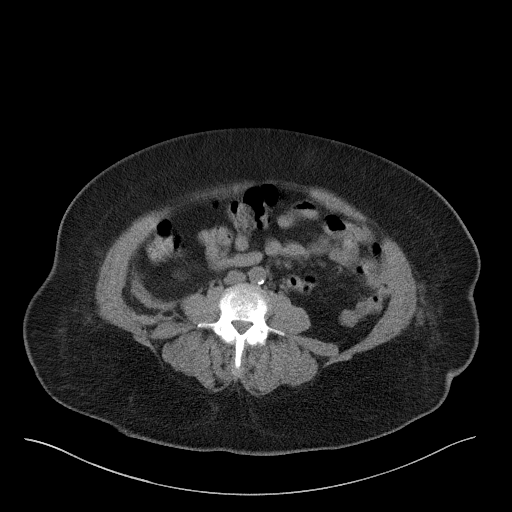
[im 50/82  soft-tissue]
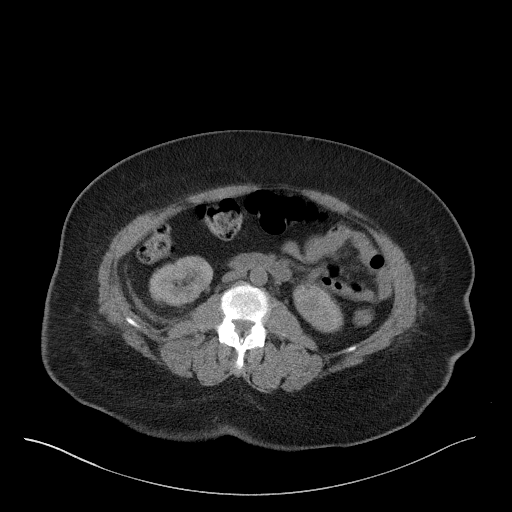
[im 57/82  soft-tissue]
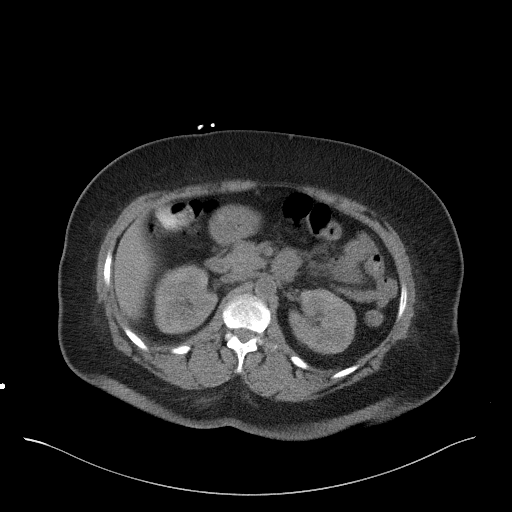
[im 57/82  lung]
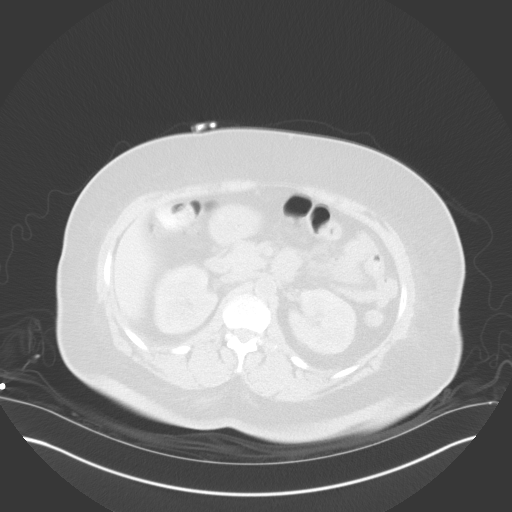
[im 63/82  lung]
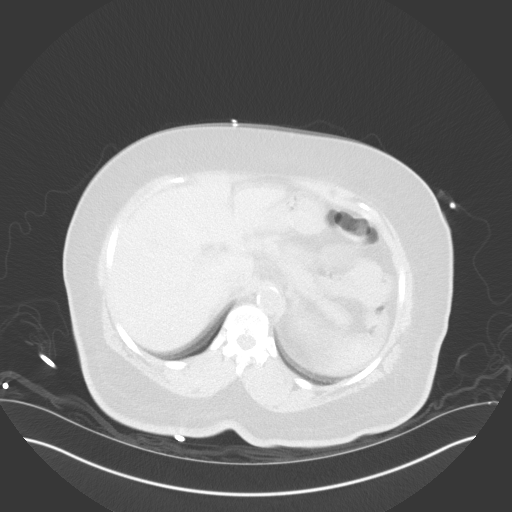
[im 69/82  soft-tissue]
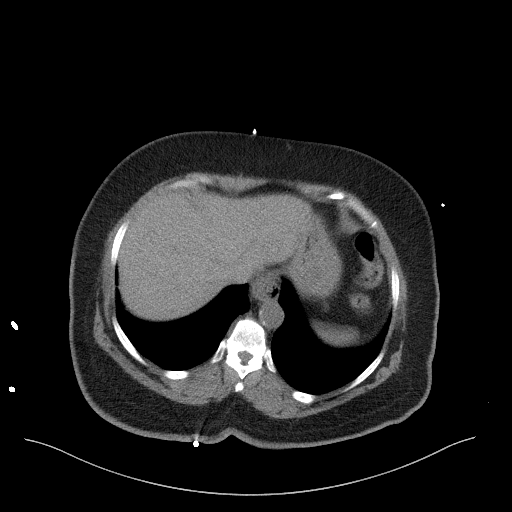
[im 69/82  lung]
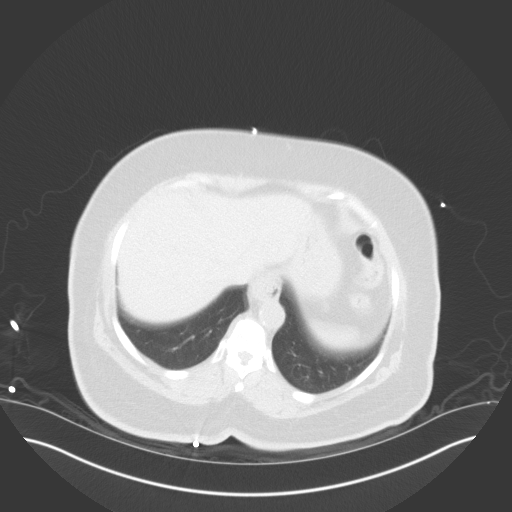
[im 75/82  soft-tissue]
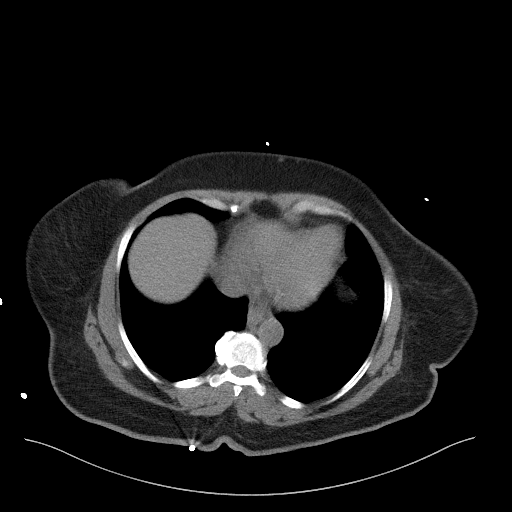
[im 75/82  lung]
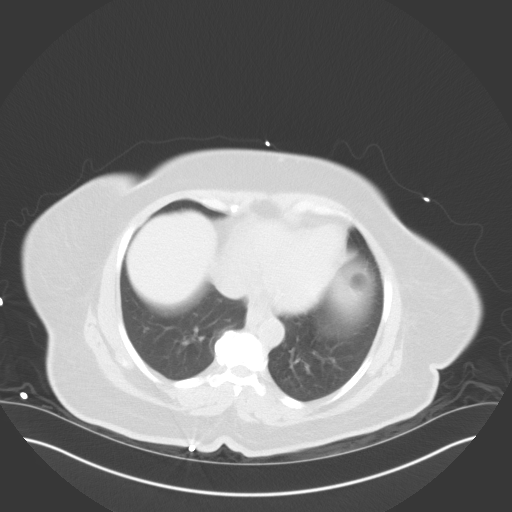
[im 75/82  bone]
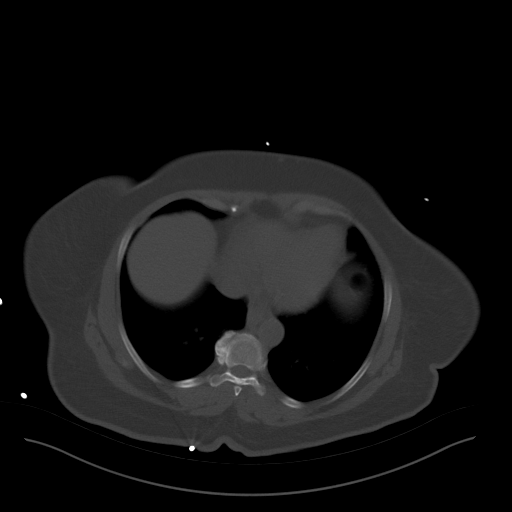

[Series 7: a/p w/o cor · coronal · non-contrast · 0.77mm/px · 3 of 151 slices shown]
[im 38/151  soft-tissue]
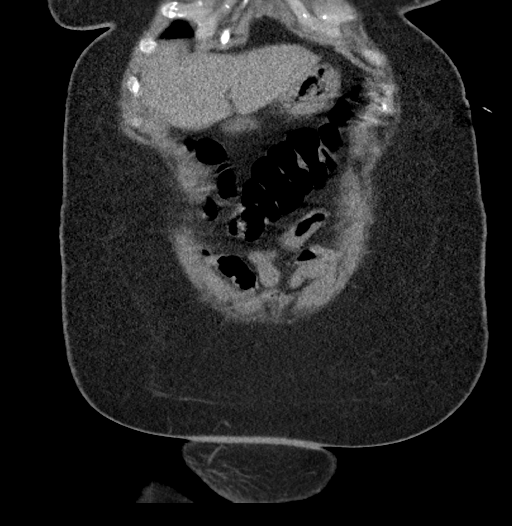
[im 76/151  soft-tissue]
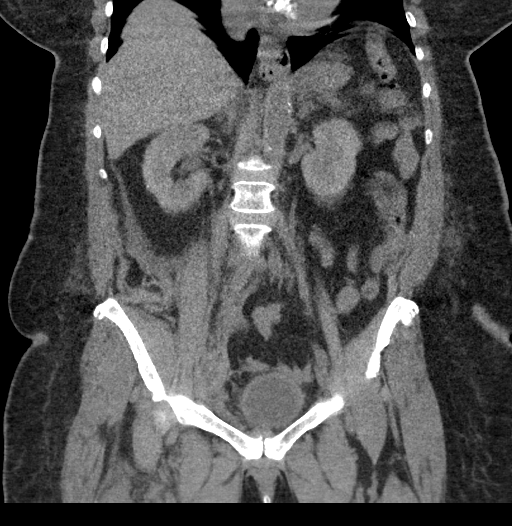
[im 113/151  soft-tissue]
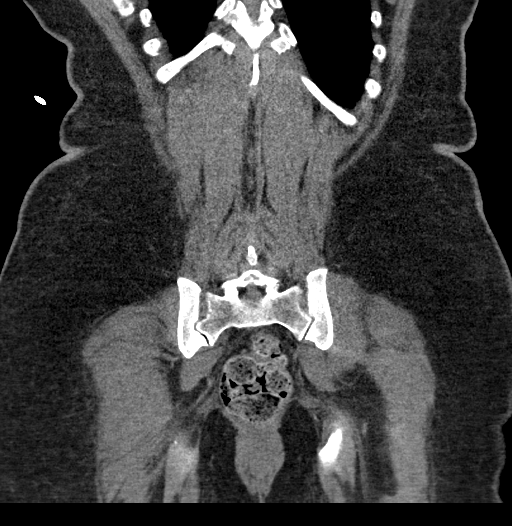

[13 of 46 positions shown; findings below may reference images not displayed]

FINDINGS: Lower chest: No acute abnormality.

Hepatobiliary: No focal liver abnormality is seen. No gallstones,
gallbladder wall thickening, or biliary dilatation.

Pancreas: Unremarkable. No pancreatic ductal dilatation or
surrounding inflammatory changes.

Spleen: Normal in size without focal abnormality.

Adrenals/Urinary Tract: Adrenal glands and kidneys are unremarkable.
No hydronephrosis or renal obstruction is noted. Foley catheter is
noted within urinary bladder.

Stomach/Bowel: Stomach is within normal limits. Appendix appears
normal. No evidence of bowel wall thickening, distention, or
inflammatory changes.

Vascular/Lymphatic: Atherosclerosis of abdominal aorta is noted
without aneurysm formation. There does appear to be a mild to
moderate amount of retroperitoneal hematoma in the right iliac and
inguinal regions. Moderate size hematoma is noted in the soft
tissues anterior to the musculature of the right hip. No definite
intraperitoneal hemorrhage is noted.

Reproductive: Status post hysterectomy. No adnexal masses.

Other: Moderate size fat containing periumbilical hernia is noted.
No ascites is noted.

Musculoskeletal: No acute or significant osseous findings.
IMPRESSION: 1. Mild to moderate amount of retroperitoneal hematoma is noted in
the right iliac and inguinal regions. Moderate size hematoma is
noted in the soft tissues anterior to the musculature of the right
hip. No definite intraperitoneal hemorrhage is noted. These results
will be called to the ordering clinician or representative by the
Radiologist Assistant, and communication documented in the PACS or
zVision Dashboard.
2. Moderate size fat containing periumbilical hernia.

Aortic Atherosclerosis ([id number]).

## 2021-11-03 NOTE — Progress Notes (Signed)
This encounter was created in error - please disregard.
